# Patient Record
Sex: Male | Born: 1940 | Race: White | Hispanic: No | Marital: Married | Smoking: Former smoker
Health system: Southern US, Community
[De-identification: ages and names within clinical notes are randomized; demographics above are authoritative.]

## PROBLEM LIST (undated history)

## (undated) DIAGNOSIS — Z5189 Encounter for other specified aftercare: Secondary | ICD-10-CM

## (undated) DIAGNOSIS — R001 Bradycardia, unspecified: Secondary | ICD-10-CM

## (undated) DIAGNOSIS — C060 Malignant neoplasm of cheek mucosa: Secondary | ICD-10-CM

## (undated) DIAGNOSIS — Z85828 Personal history of other malignant neoplasm of skin: Secondary | ICD-10-CM

## (undated) DIAGNOSIS — K8020 Calculus of gallbladder without cholecystitis without obstruction: Secondary | ICD-10-CM

## (undated) DIAGNOSIS — J189 Pneumonia, unspecified organism: Secondary | ICD-10-CM

## (undated) DIAGNOSIS — K5010 Crohn's disease of large intestine without complications: Secondary | ICD-10-CM

## (undated) DIAGNOSIS — K219 Gastro-esophageal reflux disease without esophagitis: Secondary | ICD-10-CM

## (undated) DIAGNOSIS — I10 Essential (primary) hypertension: Secondary | ICD-10-CM

## (undated) DIAGNOSIS — K760 Fatty (change of) liver, not elsewhere classified: Secondary | ICD-10-CM

## (undated) DIAGNOSIS — M459 Ankylosing spondylitis of unspecified sites in spine: Secondary | ICD-10-CM

## (undated) DIAGNOSIS — K449 Diaphragmatic hernia without obstruction or gangrene: Secondary | ICD-10-CM

## (undated) DIAGNOSIS — K5100 Ulcerative (chronic) pancolitis without complications: Secondary | ICD-10-CM

## (undated) DIAGNOSIS — N200 Calculus of kidney: Secondary | ICD-10-CM

## (undated) DIAGNOSIS — Z923 Personal history of irradiation: Secondary | ICD-10-CM

## (undated) DIAGNOSIS — C801 Malignant (primary) neoplasm, unspecified: Secondary | ICD-10-CM

## (undated) DIAGNOSIS — Z9289 Personal history of other medical treatment: Secondary | ICD-10-CM

## (undated) DIAGNOSIS — I639 Cerebral infarction, unspecified: Secondary | ICD-10-CM

## (undated) DIAGNOSIS — I459 Conduction disorder, unspecified: Secondary | ICD-10-CM

## (undated) DIAGNOSIS — IMO0001 Reserved for inherently not codable concepts without codable children: Secondary | ICD-10-CM

## (undated) DIAGNOSIS — I2510 Atherosclerotic heart disease of native coronary artery without angina pectoris: Secondary | ICD-10-CM

## (undated) DIAGNOSIS — M542 Cervicalgia: Secondary | ICD-10-CM

## (undated) DIAGNOSIS — E785 Hyperlipidemia, unspecified: Secondary | ICD-10-CM

## (undated) HISTORY — DX: Diaphragmatic hernia without obstruction or gangrene: K44.9

## (undated) HISTORY — DX: Personal history of other medical treatment: Z92.89

## (undated) HISTORY — DX: Ulcerative (chronic) pancolitis without complications: K51.00

## (undated) HISTORY — DX: Cervicalgia: M54.2

## (undated) HISTORY — DX: Bradycardia, unspecified: R00.1

## (undated) HISTORY — PX: MOHS SURGERY: SUR867

## (undated) HISTORY — DX: Calculus of gallbladder without cholecystitis without obstruction: K80.20

## (undated) HISTORY — DX: Personal history of other malignant neoplasm of skin: Z85.828

## (undated) HISTORY — PX: UMBILICAL HERNIA REPAIR: SHX196

## (undated) HISTORY — PX: EXCISION ORAL TUMOR: SHX6265

## (undated) HISTORY — DX: Cerebral infarction, unspecified: I63.9

## (undated) HISTORY — DX: Hyperlipidemia, unspecified: E78.5

## (undated) HISTORY — DX: Ankylosing spondylitis of unspecified sites in spine: M45.9

## (undated) HISTORY — DX: Crohn's disease of large intestine without complications: K50.10

## (undated) HISTORY — DX: Fatty (change of) liver, not elsewhere classified: K76.0

## (undated) HISTORY — DX: Conduction disorder, unspecified: I45.9

## (undated) HISTORY — DX: Essential (primary) hypertension: I10

## (undated) HISTORY — PX: TRIGGER FINGER RELEASE: SHX641

---
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
## 1995-04-19 HISTORY — PX: CORONARY ARTERY BYPASS GRAFT: SHX141

## 1997-12-01 ENCOUNTER — Ambulatory Visit (HOSPITAL_COMMUNITY): Admission: RE | Admit: 1997-12-01 | Discharge: 1997-12-01 | Payer: Self-pay | Admitting: Gastroenterology

## 1997-12-01 DIAGNOSIS — K5010 Crohn's disease of large intestine without complications: Secondary | ICD-10-CM

## 1997-12-01 HISTORY — DX: Crohn's disease of large intestine without complications: K50.10

## 2001-05-21 ENCOUNTER — Encounter (INDEPENDENT_AMBULATORY_CARE_PROVIDER_SITE_OTHER): Payer: Self-pay | Admitting: *Deleted

## 2001-05-21 ENCOUNTER — Ambulatory Visit (HOSPITAL_COMMUNITY): Admission: RE | Admit: 2001-05-21 | Discharge: 2001-05-21 | Payer: Self-pay | Admitting: Gastroenterology

## 2001-05-21 DIAGNOSIS — K5100 Ulcerative (chronic) pancolitis without complications: Secondary | ICD-10-CM

## 2001-05-21 HISTORY — DX: Ulcerative (chronic) pancolitis without complications: K51.00

## 2004-03-21 ENCOUNTER — Ambulatory Visit: Payer: Self-pay | Admitting: Cardiology

## 2004-04-04 ENCOUNTER — Ambulatory Visit: Payer: Self-pay | Admitting: Cardiology

## 2004-04-04 ENCOUNTER — Ambulatory Visit: Payer: Self-pay | Admitting: Gastroenterology

## 2004-09-28 ENCOUNTER — Ambulatory Visit: Payer: Self-pay | Admitting: Cardiology

## 2004-12-06 ENCOUNTER — Ambulatory Visit: Payer: Self-pay | Admitting: Cardiology

## 2004-12-06 ENCOUNTER — Ambulatory Visit: Payer: Self-pay | Admitting: Gastroenterology

## 2005-03-20 ENCOUNTER — Ambulatory Visit: Payer: Self-pay | Admitting: Cardiology

## 2005-04-03 ENCOUNTER — Ambulatory Visit: Payer: Self-pay | Admitting: Gastroenterology

## 2005-04-04 ENCOUNTER — Ambulatory Visit: Payer: Self-pay

## 2005-09-26 ENCOUNTER — Ambulatory Visit: Payer: Self-pay

## 2005-10-11 ENCOUNTER — Ambulatory Visit: Payer: Self-pay | Admitting: Gastroenterology

## 2005-10-26 ENCOUNTER — Ambulatory Visit: Payer: Self-pay | Admitting: Gastroenterology

## 2005-11-28 ENCOUNTER — Ambulatory Visit: Payer: Self-pay | Admitting: Gastroenterology

## 2005-11-29 ENCOUNTER — Ambulatory Visit (HOSPITAL_COMMUNITY): Admission: RE | Admit: 2005-11-29 | Discharge: 2005-11-29 | Payer: Self-pay | Admitting: Gastroenterology

## 2005-11-30 ENCOUNTER — Ambulatory Visit: Payer: Self-pay | Admitting: Gastroenterology

## 2005-12-07 ENCOUNTER — Ambulatory Visit: Payer: Self-pay | Admitting: Cardiology

## 2005-12-07 LAB — CONVERTED CEMR LAB
AST: 27 units/L (ref 0–37)
Albumin: 3.9 g/dL (ref 3.5–5.2)
Chloride: 104 meq/L (ref 96–112)
Chol/HDL Ratio, serum: 2.1
Cholesterol: 147 mg/dL (ref 0–200)
Glomerular Filtration Rate, Af Am: 86 mL/min/{1.73_m2}
Glucose, Bld: 133 mg/dL — ABNORMAL HIGH (ref 70–99)
HDL: 71.7 mg/dL (ref >39.0)
Sodium: 139 meq/L (ref 135–145)
Total Bilirubin: 1.2 mg/dL (ref 0.3–1.2)
Total Protein: 6.7 g/dL (ref 6.0–8.3)
VLDL: 11 mg/dL (ref 0–40)

## 2005-12-12 ENCOUNTER — Ambulatory Visit: Payer: Self-pay | Admitting: Cardiology

## 2005-12-12 LAB — CONVERTED CEMR LAB
CO2: 27 meq/L (ref 19–32)
GFR calc non Af Amer: 71 mL/min
Glomerular Filtration Rate, Af Am: 86 mL/min/{1.73_m2}
Glucose, Bld: 98 mg/dL (ref 70–99)
Potassium: 4.3 meq/L (ref 3.5–5.1)

## 2006-03-27 ENCOUNTER — Ambulatory Visit: Payer: Self-pay | Admitting: Cardiology

## 2006-04-01 ENCOUNTER — Ambulatory Visit: Payer: Self-pay

## 2006-09-12 ENCOUNTER — Ambulatory Visit: Payer: Self-pay | Admitting: Gastroenterology

## 2006-09-12 LAB — CONVERTED CEMR LAB
ALT: 18 units/L (ref 0–53)
AST: 24 units/L (ref 0–37)
Alkaline Phosphatase: 39 units/L (ref 39–117)
BUN: 16 mg/dL (ref 6–23)
Basophils Absolute: 0 10*3/uL (ref 0.0–0.1)
Basophils Relative: 0.9 % (ref 0.0–1.0)
Bilirubin, Direct: 0.1 mg/dL (ref 0.0–0.3)
CO2: 28 meq/L (ref 19–32)
Chloride: 105 meq/L (ref 96–112)
Eosinophils Absolute: 0.1 10*3/uL (ref 0.0–0.6)
Eosinophils Relative: 2.8 % (ref 0.0–5.0)
Ferritin: 110.3 ng/mL (ref 22.0–322.0)
GFR calc Af Amer: 96 mL/min
HCT: 39.9 % (ref 39.0–52.0)
Iron: 71 ug/dL (ref 42–165)
Lymphocytes Relative: 20.7 % (ref 12.0–46.0)
MCHC: 34.7 g/dL (ref 30.0–36.0)
Monocytes Absolute: 0.3 10*3/uL (ref 0.2–0.7)
Monocytes Relative: 9.9 % (ref 3.0–11.0)
Neutro Abs: 2.2 10*3/uL (ref 1.4–7.7)
Potassium: 3.9 meq/L (ref 3.5–5.1)
RDW: 14.1 % (ref 11.5–14.6)
Saturation Ratios: 19.9 % — ABNORMAL LOW (ref 20.0–50.0)
Total Protein: 6.7 g/dL (ref 6.0–8.3)
Transferrin: 255.2 mg/dL (ref >=212.0)
WBC: 3.3 10*3/uL — ABNORMAL LOW (ref 4.5–10.5)

## 2006-09-19 ENCOUNTER — Ambulatory Visit: Payer: Self-pay | Admitting: Gastroenterology

## 2006-09-24 ENCOUNTER — Encounter: Payer: Self-pay | Admitting: Gastroenterology

## 2006-10-15 ENCOUNTER — Ambulatory Visit: Payer: Self-pay | Admitting: Cardiology

## 2006-10-16 ENCOUNTER — Ambulatory Visit: Payer: Self-pay | Admitting: Gastroenterology

## 2006-10-16 ENCOUNTER — Encounter: Payer: Self-pay | Admitting: Gastroenterology

## 2006-12-11 ENCOUNTER — Ambulatory Visit: Payer: Self-pay | Admitting: Gastroenterology

## 2006-12-11 LAB — CONVERTED CEMR LAB: Hgb A1c MFr Bld: 6.2 % — ABNORMAL HIGH (ref 4.6–6.0)

## 2006-12-26 ENCOUNTER — Emergency Department (HOSPITAL_COMMUNITY): Admission: EM | Admit: 2006-12-26 | Discharge: 2006-12-26 | Payer: Self-pay | Admitting: Emergency Medicine

## 2007-05-06 ENCOUNTER — Ambulatory Visit: Admission: RE | Admit: 2007-05-06 | Discharge: 2007-05-06 | Payer: Self-pay | Admitting: Gastroenterology

## 2007-05-06 ENCOUNTER — Ambulatory Visit: Payer: Self-pay | Admitting: Gastroenterology

## 2007-05-06 DIAGNOSIS — M542 Cervicalgia: Secondary | ICD-10-CM

## 2007-05-06 DIAGNOSIS — K5010 Crohn's disease of large intestine without complications: Secondary | ICD-10-CM | POA: Insufficient documentation

## 2007-05-06 DIAGNOSIS — R1310 Dysphagia, unspecified: Secondary | ICD-10-CM | POA: Insufficient documentation

## 2007-05-06 LAB — CONVERTED CEMR LAB
AST: 27 units/L (ref 0–37)
Basophils Absolute: 0 10*3/uL (ref 0.0–0.1)
Basophils Relative: 0.5 % (ref 0.0–1.0)
Bilirubin, Direct: 0.2 mg/dL (ref 0.0–0.3)
Eosinophils Absolute: 0.1 10*3/uL (ref 0.0–0.7)
Eosinophils Relative: 1.8 % (ref 0.0–5.0)
Lymphocytes Relative: 15.2 % (ref 12.0–46.0)
MCV: 102.7 fL — ABNORMAL HIGH (ref 78.0–100.0)
Monocytes Relative: 8.8 % (ref 3.0–12.0)
Platelets: 152 10*3/uL (ref 150–400)
Total Bilirubin: 1.1 mg/dL (ref 0.3–1.2)

## 2007-05-12 ENCOUNTER — Ambulatory Visit: Payer: Self-pay | Admitting: Cardiology

## 2007-05-12 LAB — CONVERTED CEMR LAB
LDL Cholesterol: 78 mg/dL (ref 0–99)
VLDL: 15 mg/dL (ref 0–40)

## 2007-05-27 ENCOUNTER — Ambulatory Visit: Payer: Self-pay

## 2007-11-11 ENCOUNTER — Ambulatory Visit: Payer: Self-pay | Admitting: Cardiology

## 2007-11-11 ENCOUNTER — Ambulatory Visit: Payer: Self-pay | Admitting: Gastroenterology

## 2007-11-11 LAB — CONVERTED CEMR LAB
BUN: 18 mg/dL (ref 6–23)
Chloride: 104 meq/L (ref 96–112)
GFR calc non Af Amer: 79 mL/min
Glucose, Bld: 119 mg/dL — ABNORMAL HIGH (ref 70–99)
Hgb A1c MFr Bld: 6.5 % — ABNORMAL HIGH (ref 4.6–6.0)
Potassium: 4.8 meq/L (ref 3.5–5.1)

## 2007-12-04 ENCOUNTER — Ambulatory Visit: Payer: Self-pay | Admitting: Gastroenterology

## 2007-12-04 DIAGNOSIS — M459 Ankylosing spondylitis of unspecified sites in spine: Secondary | ICD-10-CM | POA: Insufficient documentation

## 2007-12-04 DIAGNOSIS — I10 Essential (primary) hypertension: Secondary | ICD-10-CM

## 2007-12-04 DIAGNOSIS — I2510 Atherosclerotic heart disease of native coronary artery without angina pectoris: Secondary | ICD-10-CM | POA: Insufficient documentation

## 2007-12-04 DIAGNOSIS — E785 Hyperlipidemia, unspecified: Secondary | ICD-10-CM | POA: Insufficient documentation

## 2007-12-04 LAB — CONVERTED CEMR LAB
ALT: 18 units/L (ref 0–53)
AST: 24 units/L (ref 0–37)
Albumin: 3.9 g/dL (ref 3.5–5.2)
Alkaline Phosphatase: 37 units/L — ABNORMAL LOW (ref 39–117)
Basophils Relative: 0.8 % (ref 0.0–3.0)
Bilirubin, Direct: 0.1 mg/dL (ref 0.0–0.3)
Ferritin: 87.5 ng/mL (ref 22.0–322.0)
HCT: 41.8 % (ref 39.0–52.0)
Hemoglobin: 14.5 g/dL (ref 13.0–17.0)
Iron: 114 ug/dL (ref 42–165)
MCHC: 34.7 g/dL (ref 30.0–36.0)
MCV: 101.7 fL — ABNORMAL HIGH (ref 78.0–100.0)
Platelets: 143 10*3/uL — ABNORMAL LOW (ref 150–400)
RBC: 4.11 M/uL — ABNORMAL LOW (ref 4.22–5.81)
RDW: 12.9 % (ref 11.5–14.6)
Saturation Ratios: 29.5 % (ref 20.0–50.0)
Total Protein: 7.3 g/dL (ref 6.0–8.3)

## 2007-12-10 ENCOUNTER — Ambulatory Visit: Payer: Self-pay | Admitting: Gastroenterology

## 2007-12-16 ENCOUNTER — Ambulatory Visit: Payer: Self-pay | Admitting: Gastroenterology

## 2007-12-16 LAB — CONVERTED CEMR LAB: Fecal Occult Bld: NEGATIVE

## 2007-12-30 ENCOUNTER — Ambulatory Visit: Payer: Self-pay | Admitting: Cardiology

## 2007-12-30 LAB — CONVERTED CEMR LAB
BUN: 17 mg/dL (ref 6–23)
CO2: 31 meq/L (ref 19–32)
Calcium: 9 mg/dL (ref 8.4–10.5)
Chloride: 105 meq/L (ref 96–112)
Creatinine, Ser: 1.1 mg/dL (ref 0.4–1.5)
GFR calc non Af Amer: 71 mL/min
Glucose, Bld: 123 mg/dL — ABNORMAL HIGH (ref 70–99)

## 2008-04-27 ENCOUNTER — Ambulatory Visit: Payer: Self-pay | Admitting: Gastroenterology

## 2008-04-27 DIAGNOSIS — E118 Type 2 diabetes mellitus with unspecified complications: Secondary | ICD-10-CM

## 2008-04-27 LAB — CONVERTED CEMR LAB
Basophils Absolute: 0 10*3/uL (ref 0.0–0.1)
Basophils Relative: 0.1 % (ref 0.0–3.0)
Hemoglobin: 15.4 g/dL (ref 13.0–17.0)
Iron: 66 ug/dL (ref 42–165)
Lymphocytes Relative: 14.2 % (ref 12.0–46.0)
MCV: 103 fL — ABNORMAL HIGH (ref 78.0–100.0)
Monocytes Relative: 13.3 % — ABNORMAL HIGH (ref 3.0–12.0)
Neutro Abs: 3.6 10*3/uL (ref 1.4–7.7)
Neutrophils Relative %: 70.7 % (ref 43.0–77.0)
Platelets: 145 10*3/uL — ABNORMAL LOW (ref 150.0–400.0)
RBC: 4.39 M/uL (ref 4.22–5.81)
RDW: 13.4 % (ref 11.5–14.6)
WBC: 5.1 10*3/uL (ref 4.5–10.5)

## 2008-05-14 ENCOUNTER — Telehealth: Payer: Self-pay | Admitting: Gastroenterology

## 2008-05-19 ENCOUNTER — Telehealth: Payer: Self-pay | Admitting: Gastroenterology

## 2008-06-01 ENCOUNTER — Ambulatory Visit: Payer: Self-pay | Admitting: Cardiology

## 2008-06-07 ENCOUNTER — Ambulatory Visit: Payer: Self-pay | Admitting: Cardiology

## 2008-06-09 LAB — CONVERTED CEMR LAB
AST: 23 units/L (ref 0–37)
Alkaline Phosphatase: 43 units/L (ref 39–117)
BUN: 11 mg/dL (ref 6–23)
Bilirubin, Direct: 0.1 mg/dL (ref 0.0–0.3)
Calcium: 8.9 mg/dL (ref 8.4–10.5)
Cholesterol: 137 mg/dL (ref 0–200)
Creatinine, Ser: 0.9 mg/dL (ref 0.4–1.5)
GFR calc non Af Amer: 88.99 mL/min (ref >60)
Glucose, Bld: 112 mg/dL — ABNORMAL HIGH (ref 70–99)
HDL: 66.2 mg/dL (ref >39.00)
Hgb A1c MFr Bld: 6.4 % (ref 4.6–6.5)
Total CHOL/HDL Ratio: 2

## 2008-08-27 ENCOUNTER — Telehealth: Payer: Self-pay | Admitting: Gastroenterology

## 2008-09-22 ENCOUNTER — Encounter (INDEPENDENT_AMBULATORY_CARE_PROVIDER_SITE_OTHER): Payer: Self-pay | Admitting: *Deleted

## 2008-12-02 ENCOUNTER — Ambulatory Visit: Payer: Self-pay | Admitting: Gastroenterology

## 2008-12-02 DIAGNOSIS — R079 Chest pain, unspecified: Secondary | ICD-10-CM

## 2008-12-02 DIAGNOSIS — K219 Gastro-esophageal reflux disease without esophagitis: Secondary | ICD-10-CM

## 2008-12-02 DIAGNOSIS — R160 Hepatomegaly, not elsewhere classified: Secondary | ICD-10-CM

## 2008-12-03 ENCOUNTER — Ambulatory Visit (HOSPITAL_COMMUNITY): Admission: RE | Admit: 2008-12-03 | Discharge: 2008-12-03 | Payer: Self-pay | Admitting: Gastroenterology

## 2008-12-03 ENCOUNTER — Ambulatory Visit: Payer: Self-pay | Admitting: Gastroenterology

## 2008-12-03 DIAGNOSIS — K449 Diaphragmatic hernia without obstruction or gangrene: Secondary | ICD-10-CM

## 2008-12-03 HISTORY — DX: Diaphragmatic hernia without obstruction or gangrene: K44.9

## 2008-12-03 LAB — CONVERTED CEMR LAB
ALT: 23 units/L (ref 0–53)
AST: 32 units/L (ref 0–37)
Albumin: 4.2 g/dL (ref 3.5–5.2)
Alkaline Phosphatase: 48 units/L (ref 39–117)
Basophils Absolute: 0 10*3/uL (ref 0.0–0.1)
Basophils Relative: 0.4 % (ref 0.0–3.0)
Calcium: 9 mg/dL (ref 8.4–10.5)
Chloride: 100 meq/L (ref 96–112)
Creatinine, Ser: 1 mg/dL (ref 0.4–1.5)
Eosinophils Relative: 2.3 % (ref 0.0–5.0)
Ferritin: 99.6 ng/mL (ref 22.0–322.0)
Hgb A1c MFr Bld: 6.5 % (ref 4.6–6.5)
INR: 0.9 (ref 0.8–1.0)
Lymphs Abs: 1.1 10*3/uL (ref 0.7–4.0)
MCHC: 33.5 g/dL (ref 30.0–36.0)
MCV: 102.9 fL — ABNORMAL HIGH (ref 78.0–100.0)
Monocytes Relative: 12.6 % — ABNORMAL HIGH (ref 3.0–12.0)
Neutrophils Relative %: 62.9 % (ref 43.0–77.0)
Platelets: 156 10*3/uL (ref 150.0–400.0)
RDW: 13.4 % (ref 11.5–14.6)
TSH: 2.99 microintl units/mL (ref 0.35–5.50)
Transferrin: 297.6 mg/dL (ref 212.0–360.0)
WBC: 5.1 10*3/uL (ref 4.5–10.5)

## 2008-12-06 ENCOUNTER — Telehealth: Payer: Self-pay | Admitting: Gastroenterology

## 2008-12-07 ENCOUNTER — Ambulatory Visit: Payer: Self-pay | Admitting: Cardiology

## 2008-12-07 DIAGNOSIS — I2510 Atherosclerotic heart disease of native coronary artery without angina pectoris: Secondary | ICD-10-CM | POA: Insufficient documentation

## 2008-12-07 DIAGNOSIS — I252 Old myocardial infarction: Secondary | ICD-10-CM | POA: Insufficient documentation

## 2008-12-08 ENCOUNTER — Encounter: Payer: Self-pay | Admitting: Gastroenterology

## 2009-01-06 ENCOUNTER — Telehealth: Payer: Self-pay | Admitting: Gastroenterology

## 2009-02-07 ENCOUNTER — Telehealth: Payer: Self-pay | Admitting: Cardiology

## 2009-02-15 ENCOUNTER — Encounter: Payer: Self-pay | Admitting: Cardiology

## 2009-02-21 ENCOUNTER — Telehealth: Payer: Self-pay | Admitting: Cardiology

## 2009-02-22 ENCOUNTER — Telehealth: Payer: Self-pay | Admitting: Gastroenterology

## 2009-02-25 ENCOUNTER — Telehealth: Payer: Self-pay | Admitting: Gastroenterology

## 2009-03-23 ENCOUNTER — Encounter: Payer: Self-pay | Admitting: Gastroenterology

## 2009-03-23 ENCOUNTER — Encounter: Payer: Self-pay | Admitting: Cardiology

## 2009-06-07 ENCOUNTER — Ambulatory Visit: Payer: Self-pay | Admitting: Cardiology

## 2009-09-20 ENCOUNTER — Ambulatory Visit: Payer: Self-pay | Admitting: Cardiology

## 2009-09-20 DIAGNOSIS — I6529 Occlusion and stenosis of unspecified carotid artery: Secondary | ICD-10-CM | POA: Insufficient documentation

## 2009-09-27 ENCOUNTER — Telehealth (INDEPENDENT_AMBULATORY_CARE_PROVIDER_SITE_OTHER): Payer: Self-pay | Admitting: *Deleted

## 2009-09-28 ENCOUNTER — Encounter: Payer: Self-pay | Admitting: Cardiology

## 2009-09-28 ENCOUNTER — Ambulatory Visit: Payer: Self-pay

## 2009-09-28 ENCOUNTER — Ambulatory Visit: Payer: Self-pay | Admitting: Cardiology

## 2009-09-28 ENCOUNTER — Encounter (HOSPITAL_COMMUNITY): Admission: RE | Admit: 2009-09-28 | Discharge: 2009-10-07 | Payer: Self-pay | Admitting: Cardiology

## 2009-10-25 ENCOUNTER — Telehealth: Payer: Self-pay | Admitting: Cardiology

## 2010-01-01 HISTORY — PX: COLONOSCOPY: SHX174

## 2010-01-17 ENCOUNTER — Encounter (INDEPENDENT_AMBULATORY_CARE_PROVIDER_SITE_OTHER): Payer: Self-pay | Admitting: *Deleted

## 2010-01-17 ENCOUNTER — Telehealth: Payer: Self-pay | Admitting: Gastroenterology

## 2010-01-17 ENCOUNTER — Ambulatory Visit
Admission: RE | Admit: 2010-01-17 | Discharge: 2010-01-17 | Payer: Self-pay | Source: Home / Self Care | Attending: Gastroenterology | Admitting: Gastroenterology

## 2010-01-17 DIAGNOSIS — F329 Major depressive disorder, single episode, unspecified: Secondary | ICD-10-CM | POA: Insufficient documentation

## 2010-01-17 DIAGNOSIS — K5190 Ulcerative colitis, unspecified, without complications: Secondary | ICD-10-CM | POA: Insufficient documentation

## 2010-01-17 DIAGNOSIS — K8020 Calculus of gallbladder without cholecystitis without obstruction: Secondary | ICD-10-CM | POA: Insufficient documentation

## 2010-01-27 ENCOUNTER — Encounter: Payer: Self-pay | Admitting: Cardiology

## 2010-01-29 LAB — CONVERTED CEMR LAB
AST: 29 units/L (ref 0–37)
Albumin: 4.3 g/dL (ref 3.5–5.2)
Alkaline Phosphatase: 44 units/L (ref 39–117)
Basophils Relative: 0.5 % (ref 0.0–3.0)
Eosinophils Relative: 2.3 % (ref 0.0–5.0)
GFR calc non Af Amer: 87.61 mL/min (ref >60)
Glucose, Bld: 125 mg/dL — ABNORMAL HIGH (ref 70–99)
HCT: 42.2 % (ref 39.0–52.0)
HDL: 72.7 mg/dL (ref >39.00)
Hemoglobin: 14.6 g/dL (ref 13.0–17.0)
MCHC: 34.6 g/dL (ref 30.0–36.0)
MCV: 99.9 fL (ref 78.0–100.0)
Neutro Abs: 3.2 10*3/uL (ref 1.4–7.7)
Potassium: 5 meq/L (ref 3.5–5.1)
RBC: 4.23 M/uL (ref 4.22–5.81)
RDW: 14 % (ref 11.5–14.6)
Total Bilirubin: 0.7 mg/dL (ref 0.3–1.2)
Total CHOL/HDL Ratio: 2
Triglycerides: 74 mg/dL (ref 0.0–149.0)
VLDL: 14.8 mg/dL (ref 0.0–40.0)

## 2010-02-01 ENCOUNTER — Other Ambulatory Visit (AMBULATORY_SURGERY_CENTER): Payer: Medicare Other | Admitting: Gastroenterology

## 2010-02-01 ENCOUNTER — Other Ambulatory Visit: Payer: Self-pay | Admitting: Gastroenterology

## 2010-02-01 ENCOUNTER — Ambulatory Visit: Admit: 2010-02-01 | Payer: Self-pay | Admitting: Gastroenterology

## 2010-02-01 DIAGNOSIS — K5289 Other specified noninfective gastroenteritis and colitis: Secondary | ICD-10-CM

## 2010-02-01 DIAGNOSIS — Z1211 Encounter for screening for malignant neoplasm of colon: Secondary | ICD-10-CM

## 2010-02-02 ENCOUNTER — Encounter (INDEPENDENT_AMBULATORY_CARE_PROVIDER_SITE_OTHER): Payer: Self-pay | Admitting: *Deleted

## 2010-02-02 ENCOUNTER — Other Ambulatory Visit: Payer: Self-pay | Admitting: Gastroenterology

## 2010-02-02 ENCOUNTER — Other Ambulatory Visit: Payer: Medicare Other

## 2010-02-02 ENCOUNTER — Ambulatory Visit (INDEPENDENT_AMBULATORY_CARE_PROVIDER_SITE_OTHER): Payer: Medicare Other | Admitting: Gastroenterology

## 2010-02-02 ENCOUNTER — Encounter: Payer: Self-pay | Admitting: Gastroenterology

## 2010-02-02 DIAGNOSIS — K5190 Ulcerative colitis, unspecified, without complications: Secondary | ICD-10-CM

## 2010-02-02 DIAGNOSIS — F329 Major depressive disorder, single episode, unspecified: Secondary | ICD-10-CM

## 2010-02-02 DIAGNOSIS — F3289 Other specified depressive episodes: Secondary | ICD-10-CM

## 2010-02-02 LAB — BASIC METABOLIC PANEL
CO2: 29 mEq/L (ref 19–32)
Creatinine, Ser: 0.9 mg/dL (ref 0.4–1.5)

## 2010-02-02 LAB — IBC PANEL: Iron: 56 ug/dL (ref 42–165)

## 2010-02-02 LAB — HEPATIC FUNCTION PANEL
ALT: 21 U/L (ref 0–53)
AST: 26 U/L (ref 0–37)
Albumin: 4 g/dL (ref 3.5–5.2)
Total Protein: 6.7 g/dL (ref 6.0–8.3)

## 2010-02-02 LAB — B12 AND FOLATE PANEL: Vitamin B-12: >1500 pg/mL — ABNORMAL HIGH (ref 211–911)

## 2010-02-02 LAB — SEDIMENTATION RATE: Sed Rate: 14 mm/hr (ref 0–22)

## 2010-02-02 LAB — CBC WITH DIFFERENTIAL/PLATELET
HCT: 42.9 % (ref 39.0–52.0)
Hemoglobin: 14.5 g/dL (ref 13.0–17.0)
Lymphs Abs: 1 10*3/uL (ref 0.7–4.0)
MCHC: 33.9 g/dL (ref 30.0–36.0)
MCV: 100.4 fl — ABNORMAL HIGH (ref 78.0–100.0)
RBC: 4.27 Mil/uL (ref 4.22–5.81)

## 2010-02-02 LAB — FERRITIN: Ferritin: 98.4 ng/mL (ref 22.0–322.0)

## 2010-02-02 LAB — MAGNESIUM: Magnesium: 2.2 mg/dL (ref 1.5–2.5)

## 2010-02-02 LAB — GLUCOSE, CAPILLARY: Glucose-Capillary: 130 mg/dL — ABNORMAL HIGH (ref 70–99)

## 2010-02-02 LAB — TSH: TSH: 2.3 u[IU]/mL (ref 0.35–5.50)

## 2010-02-02 NOTE — Assessment & Plan Note (Signed)
Summary: f52m/dfg  Medications Added LOSARTAN POTASSIUM 100 MG TABS (LOSARTAN POTASSIUM) 1 once daily LIPITOR 80 MG TABS (ATORVASTATIN CALCIUM) 1 once daily      Allergies Added: NKDA  Visit Type:  3 mo f/u Primary Provider:  n/a  CC:  pt states he has had a little chest discomfort....sob w/stairs....denies any edema.  History of Present Illness: Andres Smith returns today for evaluation and management of coronary disease, history of myocardial infarction, normal left ventricular systolic function, mild mitral regurgitation, hyperlipidemia, type 2 diabetes, and hypertension.  He is having no symptoms of angina or ischemia. He denies any symptoms of TIAs or mini strokes.  He does have dyspnea on exertion .Marland Kitchen  He is very compliant with his medications. He exercises on a regular basis. His weight has been a challenge and he is up a little bit  His stress nuclear study was last done in 2009 and was stable.   His last blood work was June of this year. His fasting blood sugar was 125 creatinine 0.9 potassium 5.0 hemoglobin 14.6 normal LFTs, total cholesterol 159 triglycerides 74 HDL 72.7 LDL 72.    Current Medications (verified): 1)  Benicar 40 Mg Tabs (Olmesartan Medoxomil) .... Take 1 Tablet By Mouth Once A Day 2)  Carvedilol 6.25 Mg Tabs (Carvedilol) .... Take 1 Tablet By Mouth Twice A Day 3)  Foltx 2.5-25-2 Mg Tabs (Fa-Pyridoxine-Cyancobalamin) .... Take 1 Tablet By Mouth Once A Day 4)  Purinethol 50 Mg Tabs (Mercaptopurine) .... Take One By Mouth Once Daily 5)  Viagra 100 Mg Tabs (Sildenafil Citrate) .... Take 1 Tablet As Directed 6)  Vytorin 10-40 Mg Tabs (Ezetimibe-Simvastatin) .... One Tablet By Mouth Once Daily 7)  Aspirin 81 Mg  Tabs (Aspirin) .... One Tablet By Mouth Once Daily 8)  Metformin Hcl 500 Mg Tabs (Metformin Hcl) .... Once Daily 9)  Lialda 1.2 Gm Tbec (Mesalamine) .... Take 2 Tablets By Mouth Once Daily 10)  Omeprazole 20 Mg Cpdr (Omeprazole) .Marland Kitchen.. 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 Particia Lather umbilical hernia repair Trigger finger surgery  Family History: Last updated: 12/04/2007 Family History of Heart Disease: Father, Mother, Andres Smith, cousins, Aunt Family History of Colitis/Crohn's: Aunt, Cousin  Social History: Last updated: 12/04/2007 Married Patient has never smoked.  Alcohol Use - yes Daily Caffeine Use Illicit Drug Use - no  Risk Factors: Alcohol Use: 2 (12/04/2007)  Risk Factors: Smoking Status: never (12/04/2007)  Review of Systems       negative other than history of present illness  Vital Signs:  Patient profile:   70 year old male Height:      68 inches Weight:      203.4 pounds BMI:     31.04 Pulse rate:   49 / minute Pulse rhythm:   irregular BP sitting:   116 / 70  (left arm) Cuff size:   large  Vitals Entered By: Danielle Rankin, CMA (September 20, 2009 11:31 AM)  Physical Exam  General:  no acute distress, overweight Head:  normocephalic and atraumatic Eyes:  PERRLA/EOM intact; conjunctiva and lids normal. Neck:  Neck supple, no  JVD. No masses, thyromegaly or abnormal cervical nodes. Chest Wall:  no deformities or breast masses noted Lungs:  Clear bilaterally to auscultation and percussion. Heart:  PMI not displaced, normal S1-S2, no murmur. Right carotid bruit Msk:  decreased ROM.   Pulses:  pulses normal in all 4 extremities Extremities:  No  clubbing or cyanosis. Neurologic:  Alert and oriented x 3. Skin:  Intact without lesions or rashes. Psych:  Normal affect.   Problems:  Medical Problems Added: 1)  Dx of Carotid Artery Disease  (ICD-433.10) 2)  Dx of Carotid Artery Stenosis, Without Infarction  (ICD-433.10)  EKG  Procedure date:  09/20/2009  Findings:      sinus pericardia, no acute changes.  Impression & Recommendations:  Problem # 1:  CAD, NATIVE VESSEL (ICD-414.01) Will obtain stress nuclear study since the last one was in 2009. He may have silent ischemia with his diabetes. His updated medication list for this problem includes:    Carvedilol 6.25 Mg Tabs (Carvedilol) .Marland Kitchen... Take 1 tablet by mouth twice a day    Aspirin 81 Mg Tabs (Aspirin) ..... One tablet by mouth once daily  Orders: Nuclear Stress Test (Nuc Stress Test)  Problem # 2:  OLD MYOCARDIAL INFARCTION (ICD-412) Assessment: Unchanged  His updated medication list for this problem includes:    Carvedilol 6.25 Mg Tabs (Carvedilol) .Marland Kitchen... Take 1 tablet by mouth twice a day    Aspirin 81 Mg Tabs (Aspirin) ..... One tablet by mouth once daily  Problem # 3:  CORONARY ARTERY BYPASS GRAFT, FOUR VESSEL, HX OF (ICD-V45.81) Assessment: Unchanged  Problem # 4:  HYPERLIPIDEMIA, ATHEROGENIC (ICD-272.4) Assessment: Improved  His updated medication list for this problem includes:    Lipitor 80 Mg Tabs (Atorvastatin calcium) .Marland Kitchen... 1 once daily  Problem # 5:  DIABETES MELLITUS, TYPE II, MILD (ICD-250.00) Assessment: Unchanged  His updated medication list for this problem includes:    Losartan Potassium 100 Mg Tabs (Losartan potassium) .Marland Kitchen... 1 once daily    Aspirin 81 Mg Tabs (Aspirin) ..... One tablet by mouth once daily    Metformin Hcl 500 Mg Tabs (Metformin hcl) ..... Once daily  Problem # 6:  CAROTID ARTERY STENOSIS, WITHOUT INFARCTION (ICD-433.10) Assessment: New  Will obtain carotid Doppler His updated medication list for this problem  includes:    Aspirin 81 Mg Tabs (Aspirin) ..... One tablet by mouth once daily  His updated medication list for this problem includes:    Aspirin 81 Mg Tabs (Aspirin) ..... One tablet by mouth once daily  Other Orders: EKG w/ Interpretation (93000) Carotid Duplex (Carotid Duplex)  Patient Instructions: 1)  Your physician recommends that you schedule a follow-up appointment in: 6  MONTHS WITH DR WALL 2)  Your physician has recommended you make the following change in your medication: STOP BENICAR  3)  START LOSARTAN 100MG   4)  FISNISH VYTORIN 5)  START LIPITOR 80 MG 1 QD 6)  Your physician has requested that you have a carotid duplex. This test is an ultrasound of the carotid arteries in your neck. It looks at blood flow through these arteries that supply the brain with blood. Allow one hour for this exam. There are no restrictions or special instructions. 7)  Your physician has requested that you have an exercise stress myoview.  For further information please visit [email or web address]  Please follow instruction sheet, as given. Prescriptions: LIPITOR 80 MG TABS (ATORVASTATIN CALCIUM) 1 once daily  #30 x 11   Entered by:   Scherrie Bateman,  LPN   Authorized by:   Gaylord Shih, MD, [facility]   Signed by:   Scherrie Bateman, LPN on 78/46/9629   Method used:   Electronically to        [facility]* (retail)       [address]       [place], [place]  [address]       Ph: [phone]       Fax: [phone]   RxID:   [id number] LOSARTAN POTASSIUM 100 MG TABS (LOSARTAN POTASSIUM) 1 once daily  #30 x 11   Entered by:   Scherrie Bateman, LPN   Authorized by:   Gaylord Shih, MD, [facility]   Signed by:   Scherrie Bateman, LPN on 41/66/0630   Method used:   Electronically to        [facility]* (retail)       [address]       [place], [place]  [address]       Ph: [phone]       Fax: [phone]   RxID:   [id number]

## 2010-02-02 NOTE — Letter (Signed)
Summary: Addendum/[facility]  Addendum/[facility]   Imported By: Sherian Rein 04/29/2009 09:35:07  _____________________________________________________________________  External Attachment:    Type:   Image     Comment:   External Document

## 2010-02-02 NOTE — Assessment & Plan Note (Signed)
Summary: Cardiology Nuclear Testing  Nuclear Med Background Indications for Stress Test: Evaluation for Ischemia, Graft Patency   History: CABG, Heart Catheterization, Myocardial Infarction, Myocardial Perfusion Study  History Comments: '97MI> Cath>CABG 5/09 MPS normal NF scar with EF-45%  Symptoms: Chest Pain, Dizziness, DOE  Symptoms Comments: Last CP 2 weeks ago.   Nuclear Pre-Procedure Cardiac Risk Factors: Family History - CAD, History of Smoking, Hypertension, Lipids, NIDDM Caffeine/Decaff Intake: NONE NPO After: 9:00 PM Lungs: Clear IV 0.9% NS with Angio Cath: 22g     IV Site: R Hand IV Started by: Cathlyn Parsons, RN Chest Size (in) 46     Height (in): 68 Weight (lb): 202 BMI: 30.83 Tech Comments: CARVEDILOL HELD X 24HRS.  Nuclear Med Study 1 or 2 day study:  1 day     Stress Test Type:  Stress Reading MD:  Willa Rough, MD     Referring MD:  Valera Castle Resting Radionuclide:  Technetium 34m Tetrofosmin     Resting Radionuclide Dose:  11.0 mCi  Stress Radionuclide:  Technetium 31m Tetrofosmin     Stress Radionuclide Dose:  33.0 mCi   Stress Protocol Exercise Time (min):  9:00 min     Max HR:  130 bpm     Predicted Max HR:  150 bpm  Max Systolic BP: 195 mm Hg     Percent Max HR:  86.67 %     METS: 10.1 Rate Pressure Product:  81191    Stress Test Technologist:  Irean Hong,  RN     Nuclear Technologist:  Domenic Polite, CNMT  Rest Procedure  Myocardial perfusion imaging was performed at rest 45 minutes following the intravenous administration of Technetium 40m Tetrofosmin.  Stress Procedure  The patient exercised for nine minutes, RPE=15.   The patient stopped due to DOE and complained of chest tightness 2/10 at peak exercise.  There were significant ST-T wave changes, frequent PVC's, begiminy and trigeminy PVC's, rare PAC. The patient had a mild hypertensive response to exercise.  Technetium 104m Tetrofosmin was injected at peak exercise and myocardial  perfusion imaging was performed after a brief delay.  QPS Raw Data Images:  Normal; no motion artifact; normal heart/lung ratio. Stress Images:  Moderate decrease in activity in the infero-lateral wall Rest Images:  Same as stress Subtraction (SDS):  No evidence of ischemia. Transient Ischemic Dilatation:  1.05  (Normal <1.22)  Lung/Heart Ratio:  .28  (Normal <0.45)  Quantitative Gated Spect Images QGS EDV:  129 ml QGS ESV:  64 ml QGS EF:  50 % QGS cine images:  Decreased motion of the inferior wall.  Findings Abnormal      Overall Impression  Exercise Capacity: Good exercise capacity. BP Response: Normal blood pressure response. Clinical Symptoms: Chest tight (2/10) and SOB ECG Impression: No significant ST segment change suggestive of ischemia. Overall Impression Comments: There is old inferolateral scar with no ischemia.  Appended Document: Cardiology Nuclear Testing discussed with pt....no  change in meds. repeat in 2 years

## 2010-02-02 NOTE — Assessment & Plan Note (Addendum)
Summary: follow up/lk    History of Present Illness Visit Type: Follow-up Visit Primary GI MD: Sheryn Bison MD FACP FAGA Primary Provider: n/a Requesting Provider: n/a Chief Complaint: Patient here for routine f/u crohn's disease. He currently denies any GI problems. 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 His medical problems are managed by Dr. Valera Castle.He has mild glucose intolerance treated with metformin 500 mg a day, essential hypertension, and previous coronary artery bypass surgery. He also suffers from mild obesity.  His colitis has been managed with 6-MP which also has greatly controlled his ankylosing spondylitis symptomatology. Regular blood work has been unremarkable without evidence of leukopenia liver function test abnormalities. 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 His appetite is good and his weight is stable. He does exercise regularly.  There is no history of diarrhea, melena, hematochezia, or current acid reflux symptoms. He has  asymptomatic gallstones and has been evaluated by Dr. Ovidio Kin in surgery who has recommended clinical observation. The patient certainly denies any hepatobiliary complaints at this time.  Esther has new onset depression partially related to his retirement, alcohol use, and chronic medical problems. He has some sleep disturbance, but does admit to mood disturbance and mild depression without any psychotic symptomatology. He does not feel that he needs psychiatric evaluation.   GI Review of Systems      Denies abdominal pain, acid reflux, belching, bloating, chest pain, dysphagia with liquids, dysphagia with solids, heartburn, loss of appetite, nausea, vomiting, vomiting blood, weight loss, and  weight gain.      Reports liver problems.     Denies anal fissure,  black tarry stools, change in bowel habit, constipation, diarrhea, diverticulosis, fecal incontinence, heme positive stool, hemorrhoids, irritable bowel syndrome, jaundice, light color stool, rectal bleeding, and  rectal pain.    Current Medications (verified): 1)  Losartan Potassium 100 Mg Tabs (Losartan Potassium) .Marland Kitchen.. 1 Once Daily 2)  Carvedilol 6.25 Mg Tabs (Carvedilol) .... Take 1 Tablet By Mouth Twice A Day 3)  Foltx 2.5-25-2 Mg Tabs (Fa-Pyridoxine-Cyancobalamin) .... Take 1 Tablet By Mouth Once A Day 4)  Purinethol 50 Mg Tabs (Mercaptopurine) .... Take One By Mouth Once Daily 5)  Viagra 100 Mg Tabs (Sildenafil Citrate) .... Take 1 Tablet As Directed 6)  Lipitor 80 Mg Tabs (Atorvastatin Calcium) .... Take 1 Tablet By Mouth Once A Day 7)  Aspirin 81 Mg  Tabs (Aspirin) .... One Tablet By Mouth Once Daily 8)  Metformin Hcl 500 Mg (Osm) Xr24h-Tab (Metformin Hcl) .... Take 1 Tablet Daily 9)  Lialda 1.2 Gm Tbec (Mesalamine) .... Take 2 Tablets By Mouth Once Daily  Allergies (verified): No Known Drug Allergies  Past History:  Past medical, surgical, family and social histories (including risk factors) reviewed for relevance to current acute and chronic problems.  Past Medical History: Reviewed history from 05/28/2008 and no changes required. 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 Heart Bypass surgery x 7.Marland KitchenApril 18, 1997..Dr. Particia Lather umbilical hernia repair Trigger finger surgery  Family History: Reviewed history from 12/04/2007 and no changes required. Family History of  Heart  Disease: Father, Mother, Kateri Mc, cousins, Aunt Family History of Colitis/Crohn's: Aunt, Cousin  Social History: Reviewed history from 12/04/2007 and no changes required. Married Patient has never smoked.  Alcohol Use - yes Daily Caffeine Use Illicit Drug Use - no  Review of Systems       The patient complains of arthritis/joint pain, back pain, depression-new, muscle pains/cramps, night sweats, and sleeping problems.  The patient denies allergy/sinus, anemia, anxiety-new, blood in urine, breast changes/lumps, change in vision, confusion, cough, coughing up blood, fainting, fatigue, fever, headaches-new, hearing problems, heart murmur, heart rhythm changes, itching, menstrual pain, nosebleeds, pregnancy symptoms, shortness of breath, skin rash, sore throat, swelling of feet/legs, swollen lymph glands, thirst - excessive , urination - excessive , urination changes/pain, urine leakage, vision changes, and voice change.    Vital Signs:  Patient profile:   70 year old male Height:      68 inches Weight:      205.38 pounds BMI:     31.34 BSA:     2.07 Pulse rate:   56 / minute Pulse rhythm:   irregular BP sitting:   112 / 58  (left arm)  Vitals Entered By: Lamona Curl CMA Duncan Dull) (January 17, 2010 9:37 AM)  Physical Exam  General:  Well developed, well nourished, no acute distress. Head:  Normocephalic and atraumatic. Eyes:  PERRLA, no icterus.exam deferred to patient's ophthalmologist.   Lungs:  Clear throughout to auscultation.decreased BS on L and decreased BS on R.   Heart:  Regular rate and rhythm; no murmurs, rubs,  or bruits. Abdomen:  Somewhat protuberant abdomen without definite organomegaly, masses or tenderness. Patient does not have an umbilicus from previous surgery. Bowel sounds are normal. Rectal:  deferred until time of colonoscopy.   Msk:  Symmetrical with no gross deformities. Normal posture. Pulses:  Normal pulses noted. Extremities:  No clubbing,  cyanosis, edema or deformities noted. Neurologic:  Alert and  oriented x4;  grossly normal neurologically. Psych:  depressed affect.     Impression & Recommendations:  Problem # 1:  DEPRESSION (ICD-311) Assessment New Start Lexapro 10 mg at bedtime with followup at the time of colonoscopy. I again have counseled this patient about his alcohol intake and have urged him to be prudent in his alcohol use especially since his wife in the future has been greatly concerned about this problem. Review of his labs shows normal liver function test and CBC. He has a mild macrocytosis probably related to 6-MP usage.  Problem # 2:  GALLSTONES (ICD-574.20) Assessment: Unchanged no clinical symptomatology at this time with surgical intervention as needed per his clinical course.  Problem # 3:  GERD (ICD-530.81) Assessment: Improved He Wishes to discontinue PPI therapy, we will observe him and treat him as needed for this problem.  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 Periodic blood counts have all been normal without evidence of leukopenia or abnormal liver function tests. His colitis is associated with his ankylosing spondylitis. I have scheduled colonoscopy with dysplasia screening per  the chronicity of his disease with last exam 4 years ago.  Problem # 5:  CAROTID ARTERY STENOSIS, WITHOUT INFARCTION (ICD-433.10) Assessment: Improved continued cardiac followup with Dr. Valera Castle. I have asked him to continue all of his cardiac medications as listed and reviewed his record.  Problem # 6:  ESSENTIAL HYPERTENSION, BENIGN (ICD-401.1) Assessment: Improved Blood Pressure Today Normal at 112/58 and pulse is 56  and regular.  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  Problem # 9:  DIABETES MELLITUS, TYPE II, MILD (ICD-250.00) Assessment: Improved weight loss reduction suggested. The patient has refused referral to Dietary for consultation. He is to continue metformin 500 mg a day which she actually takes every other day. His hemoglobin A1c's have been in acceptable ranges. This is managed by Dr. Daleen Squibb and cardiology.  Other Orders: Colonoscopy (Colon)  Patient Instructions: 1)  Your procedure has been scheduled for 02/01/2010, please follow the seperate instructions.  2)  [facility] Patient Information Guide given to patient.  3)  Colonoscopy and Flexible Sigmoidoscopy brochure given.  4)  Your prescription(s) have been sent to you pharmacy.  5)  The medication list was reviewed and reconciled.  All changed / newly prescribed medications were explained.  A complete medication list was provided to the patient / caregiver. 6)  Copy sent to : Dr. Valera Castle in cardiology and Dr. Ovidio Kin at [facility]. Prescriptions: MOVIPREP 100 GM  SOLR (PEG-KCL-NACL-NASULF-NA ASC-C) As per prep instructions.  #1 x 0   Entered by:   Harlow Mares CMA (AAMA)   Authorized by:   Mardella Layman MD [facility]   Signed by:   Harlow Mares CMA (AAMA) on 01/17/2010   Method used:   Electronically to        [facility]* (retail)       [address]       [place], [place]  [address]       Ph: [phone]       Fax: [phone]   RxID:   [id number] LEXAPRO 10 MG TABS (ESCITALOPRAM OXALATE) take one by mouth at bedtime  #30 x 6   Entered by:   Harlow Mares CMA (AAMA)   Authorized by:   Mardella Layman MD [facility]   Signed by:   Harlow Mares CMA (AAMA) on 01/17/2010   Method used:   Electronically to        [facility]* (retail)       [address]       [place], [place]  [id number]       Ph: [phone]       Fax: [phone]   RxID:   [id number]   Appended Document: follow up/lk PROBLEM  #5 SHOULD READ CORONARY ARTERY DISEASE...DRP

## 2010-02-02 NOTE — Progress Notes (Signed)
Summary: speak to nurse  Medications Added OMEPRAZOLE 20 MG CPDR (OMEPRAZOLE) 1 by mouth qd       Phone Note Call from Patient Call back at Home Phone [phone]   Caller: Patient Call For: Juanda Chance Reason for Call: Talk to Nurse Summary of Call: Wife wants to speak directly to nurse Initial call taken by: Tawni Levy,  February 25, 2009 1:50 PM  Follow-up for Phone Call        Pt request records to be sent to [facility], Dr. Dwain Sarna.  Has appt next week.  ALso states pt spoke with Dr Jarold Motto about changing form nexium to omeprazole and Dr. Demetrius Charity stated this was OK.  Ashok Cordia RN  February 25, 2009 2:06 PM  Records faxed.  Rx sent.   Follow-up by: Ashok Cordia RN,  February 25, 2009 2:12 PM    New/Updated Medications: OMEPRAZOLE 20 MG CPDR (OMEPRAZOLE) 1 by mouth qd Prescriptions: OMEPRAZOLE 20 MG CPDR (OMEPRAZOLE) 1 by mouth qd  #30 x 11   Entered by:   Ashok Cordia RN   Authorized by:   Mardella Layman MD [facility]   Signed by:   Ashok Cordia RN on 02/25/2009   Method used:   Electronically to        [facility]* (retail)       [address]       [place], [place]  [id number]       Ph: [phone]       Fax: [phone]   RxID:   [id number]

## 2010-02-02 NOTE — Progress Notes (Signed)
Summary: nuc pre procedure  Phone Note Outgoing Call Call back at Home Phone [phone]   Call placed by: Cathlyn Parsons RN,  September 27, 2009 4:34 PM Call placed to: Patient Reason for Call: Confirm/change Appt Summary of Call: Left message with information on Myoview Information Sheet (see scanned document for details).      Nuclear Med Background Indications for Stress Test: Evaluation for Ischemia, Graft Patency   History: CABG, Myocardial Infarction, Myocardial Perfusion Study  History Comments: 5/09 MPS normal NF scar with EF-45%  Symptoms: DOE    Nuclear Pre-Procedure Cardiac Risk Factors: Family History - CAD, History of Smoking, Hypertension, Lipids, NIDDM Height (in): 68

## 2010-02-02 NOTE — Progress Notes (Signed)
Summary: Questions   Phone Note Call from Patient Call back at [phone]   Caller: Patient Call For: Dr. Jarold Motto Reason for Call: Talk to Nurse Summary of Call: Pts wife is calling because her husband came in today for a visit and she wants to know what is going on with him, says that he is forgetful and needs to know why he is on a different medication and what happened at his visit Initial call taken by: [place] Johnson,  January 17, 2010 2:56 PM  Follow-up for Phone Call        called Follow-up by: Mardella Layman MD Clementeen Graham,  January 17, 2010 3:31 PM

## 2010-02-02 NOTE — Letter (Signed)
Summary: Diabetic Instructions  [facility]  [address] [place], [place] [address]   Phone: [phone]  Fax: [phone]    Andres Smith 25-Jun-1940 MRN: [id number]   X   ORAL DIABETIC MEDICATION INSTRUCTIONS  The day before your procedure:   Take your diabetic pill as you do normally  The day of your procedure:   Do not take your diabetic pill    We will check your blood sugar levels during the admission process and again in Recovery before discharging you home

## 2010-02-02 NOTE — Letter (Signed)
Summary: Dr Lavonda Jumbo Office Note  Dr Lavonda Jumbo Office Note   Imported By: Roderic Ovens 05/02/2009 16:35:35  _____________________________________________________________________  External Attachment:    Type:   Image     Comment:   External Document

## 2010-02-02 NOTE — Progress Notes (Signed)
Summary: refill Mercaptopurine   Phone Note From Pharmacy   Summary of Call: Refill requested on Mercaptopurine 50 mg from [facility]. Initial call taken by: Ashok Cordia RN,  January 06, 2009 4:48 PM    Prescriptions: PURINETHOL 50 MG TABS (MERCAPTOPURINE) take one by mouth once daily  #30 Each x 5   Entered by:   Ashok Cordia RN   Authorized by:   Mardella Layman MD [facility]   Signed by:   Ashok Cordia RN on 01/06/2009   Method used:   Electronically to        [facility]* (retail)       [address]       [place], [place]  [id number]       Ph: [phone]       Fax: [phone]   RxID:   [id number]

## 2010-02-02 NOTE — Letter (Signed)
Summary: [facility]  [facility]   Imported By: Marylou Mccoy 07/28/2009 11:35:30  _____________________________________________________________________  External Attachment:    Type:   Image     Comment:   External Document

## 2010-02-02 NOTE — Assessment & Plan Note (Signed)
Summary: 6 month ./cy      Allergies Added: NKDA  Visit Type:  Follow-up Primary Provider:  n/a   History of Present Illness: Andres Smith returns today for further evaluation and management of his coronary disease, history of bypass surgery, hypertension, diabetes, mixed hyperlipidemia.  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 He is only exercising a couple times a week as opposed to 4 times a week. His weight is increased from the high 180s to now the high 190s.  Laboratory data back in December looked good including a hemoglobin A1c. He is due lipids.  He is having no increase in his warm up angina. He denies orthopnea, PND or edema. He's had no syncope or presyncope. He has chronic asymptomatic sinus bradycardia.  Clinical Reports Reviewed:  Nuclear Study:  05/27/2007:  Excerise capacity: Good exercise capacity  Blood Pressure response: Normal blood pressure response  Clinical symptoms: No chest pain  ECG impression: Significant ST abnormalities consistent with ischemia  Overall impression: Abnormal stress nuclear study as noted above    Jonelle Sidle, MD  04/01/2006:  Excerise capacity: Excellent exercise capacity  Blood Pressure response: Normal blood pressure response  Clinical symptoms: No chest pain or dyspnea  ECG impression: Insignificant upsloping ST segment depression  Overall impression: Low risk stress nuclear study. 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 EF calculated at 50% but visuall appears better.  Arvilla Meres, MD   Current Medications (verified): 1)  Benicar 40 Mg Tabs (Olmesartan Medoxomil) .... Take 1 Tablet By Mouth Once A Day 2)  Carvedilol 6.25 Mg Tabs (Carvedilol) .... Take 1 Tablet By Mouth Twice A Day 3)  Foltx 2.5-25-2 Mg Tabs (Fa-Pyridoxine-Cyancobalamin) .... Take 1 Tablet By Mouth Once A Day 4)  Purinethol 50 Mg Tabs (Mercaptopurine) .... Take One By Mouth Once Daily 5)  Viagra 100 Mg Tabs (Sildenafil  Citrate) .... Take 1 Tablet As Directed 6)  Vytorin 10-40 Mg Tabs (Ezetimibe-Simvastatin) .... One Tablet By Mouth Once Daily 7)  Aspirin 81 Mg  Tabs (Aspirin) .... One Tablet By Mouth Once Daily 8)  Metformin Hcl 500 Mg Tabs (Metformin Hcl) .... Once Daily 9)  Lialda 1.2 Gm Tbec (Mesalamine) .... Take 2 Tablets By Mouth Once Daily 10)  Omeprazole 20 Mg Cpdr (Omeprazole) .Marland Kitchen.. 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 Particia Lather umbilical hernia repair Trigger finger surgery  Family History: Last updated: 12/04/2007 Family History of Heart Disease: Father, Mother, Kateri Mc, cousins, Aunt Family History of Colitis/Crohn's: Aunt, Cousin  Social History: Last updated: 12/04/2007 Married Patient has never smoked.  Alcohol Use - yes Daily Caffeine Use Illicit Drug Use - no  Risk Factors: Alcohol Use: 2 (12/04/2007)  Risk Factors: Smoking Status: never (12/04/2007)  Review of Systems       negative other than history of present illness.  Vital Signs:  Patient profile:   70 year old male Height:      68 inches Weight:      202 pounds BMI:     30.83 Pulse rate:   48 / minute BP sitting:   148 /  70  (left arm) Cuff size:   regular  Vitals Entered By: Burnett Kanaris, CNA (June 07, 2009 10:56 AM)  Physical Exam  General:  Well developed, well nourished, in no acute distress. Head:  normocephalic and atraumatic Eyes:  PERRLA/EOM  intact; conjunctiva and lids normal. Neck:  Neck supple, no JVD. No masses, thyromegaly or abnormal cervical nodes. Lungs:  Clear bilaterally to auscultation and percussion. Heart:  Non-displaced PMI, chest non-tender; regular rate and rhythm, S1, S2 without murmurs, rubs or gallops. Carotid upstroke normal, no bruit. Normal abdominal aortic size, no bruits. Femorals normal pulses, no bruits. Pedals normal pulses. No edema, no varicosities. Abdomen:  Bowel sounds positive; abdomen soft and non-tender without masses, organomegaly, or hernias noted. No hepatosplenomegaly. Msk:  decreased ROM.   Pulses:  pulses normal in all 4 extremities Extremities:  No clubbing or cyanosis. Neurologic:  Alert and oriented x 3. Skin:  Intact without lesions or rashes. Psych:  Normal affect.   EKG  Procedure date:  06/07/2009  Findings:      sus bradycardia, first-degree A-V block, no change  Impression & Recommendations:  Problem # 1:  CAD, NATIVE VESSEL (ICD-414.01) Assessment Unchanged  His updated medication list for this problem includes:    Carvedilol 6.25 Mg Tabs (Carvedilol) .Marland Kitchen... Take 1 tablet by mouth twice a day    Aspirin 81 Mg Tabs (Aspirin) ..... 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 Take 1 tablet by mouth twice a day    Aspirin 81 Mg Tabs (Aspirin) ..... 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 Take 1 tablet by mouth twice a day    Aspirin 81 Mg Tabs (Aspirin) ..... One tablet by mouth once daily  Problem # 5:  HYPERLIPIDEMIA, ATHEROGENIC (ICD-272.4) Will check lipids today. His updated medication list for this problem includes:    Vytorin 10-40 Mg Tabs (Ezetimibe-simvastatin) ..... One tablet by mouth once daily  Problem # 6:  ESSENTIAL HYPERTENSION, BENIGN (ICD-401.1) Assessment: Deteriorated I suspect his weight gain and decreased exercises contributed to this. He will begin to exercise 4 times a week and dry lose 5-8 pounds. He'll monitor his blood pressure time. His goal blood pressure is 130/80. His updated medication list for this problem includes:    Benicar 40 Mg Tabs (Olmesartan medoxomil) .Marland Kitchen... Take 1 tablet by mouth once a day    Carvedilol 6.25 Mg Tabs (Carvedilol) .Marland Kitchen... Take 1 tablet by mouth twice a day    Aspirin 81 Mg Tabs (Aspirin) ..... 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 Take 1 tablet by mouth once a day    Aspirin 81 Mg Tabs (Aspirin) ..... One tablet by mouth once daily    Metformin Hcl 500 Mg Tabs (Metformin hcl) ..... Once daily  Patient Instructions: 1)  Your physician  recommends that you schedule a follow-up appointment in: 3 months with Dr. Daleen Squibb 2)  Your physician recommends that you continue on your current medications as directed. Please refer to the Current Medication list given to you today. 3)  Your physician recommends that you have a lipid profile, liver, bmet, and cbc today. 4)  Your physician has requested that you regularly monitor and record your blood pressure readings at home.  Please use the same machine at the same time of day to check your readings and record them to bring to your follow-up visit. goal of 130/80  b/p reading.

## 2010-02-02 NOTE — Progress Notes (Signed)
Summary: referral from rheumatology   Phone Note Call from Patient Call back at Home Phone [phone] Call back at cell phone [phone]   Caller: Spouse- betty Reason for Call: Talk to Nurse, Referral Details for Reason: Per pt wife calling, referral for  rheumatology. 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  Initial call taken by: Lorne Skeens,  February 07, 2009 2:32 PM  Follow-up for Phone Call        S/W Kathie Rhodes, she wants a rheumatologist for Mr. Orbach. I gave her Dr. Fatima Sanger name and number. She was very thankful. Follow-up by: Duncan Dull, RN, BSN,  February 07, 2009 3:06 PM

## 2010-02-02 NOTE — Letter (Signed)
Summary: [facility] Instructions  [facility]  [address] [place], [place] [address]   Phone: [phone]  Fax: [phone]       YATES WEISGERBER    71-10-1940    MRN: [id number]        Procedure Day Dorna Bloom: Wednesday 02/01/2010     Arrival Time: 10:30am     Procedure Time: 11:30am     Location of Procedure:                    X  [facility] (4th Floor)   PREPARATION FOR COLONOSCOPY WITH MOVIPREP   Starting 5 days prior to your procedure 01/27/2010 do not eat nuts, seeds, popcorn, corn, beans, peas,  salads, or any raw vegetables.  Do not take any fiber supplements (e.g. Metamucil, Citrucel, and Benefiber).  THE DAY BEFORE YOUR PROCEDURE         Tuesday 01/31/2010  1.  Drink clear liquids the entire day-NO SOLID FOOD  2.  Do not drink anything colored red or purple.  Avoid juices with pulp.  No orange juice.  3.  Drink at least 64 oz. (8 glasses) of fluid/clear liquids during the day to prevent dehydration and help the prep work efficiently.  CLEAR LIQUIDS INCLUDE: Water Jello Ice Popsicles Tea (sugar ok, no milk/cream) Powdered fruit flavored drinks Coffee (sugar ok, no milk/cream) Gatorade Juice: apple, white grape, white cranberry  Lemonade Clear bullion, consomm, broth Carbonated beverages (any kind) Strained chicken noodle soup Hard Candy                             4.  In the morning, mix first dose of MoviPrep solution:    Empty 1 Pouch A and 1 Pouch B into the disposable container    Add lukewarm drinking water to the top line of the container. Mix to dissolve    Refrigerate (mixed solution should be used within 24 hrs)  5.  Begin drinking the prep at 5:00 p.m. The MoviPrep container is divided by 4 marks.   Every 15 minutes drink the solution down to the next mark (approximately 8 oz) until the full liter is complete.   6.  Follow completed prep with 16 oz of clear liquid of your choice (Nothing red or purple).  Continue to  drink clear liquids until bedtime.  7.  Before going to bed, mix second dose of MoviPrep solution:    Empty 1 Pouch A and 1 Pouch B into the disposable container    Add lukewarm drinking water to the top line of the container. Mix to dissolve    Refrigerate  THE DAY OF YOUR PROCEDURE      Wednesday 02/01/2010  Beginning at 6:30am (5 hours before procedure):         1. Every 15 minutes, drink the solution down to the next mark (approx 8 oz) until the full liter is complete.  2. Follow completed prep with 16 oz. of clear liquid of your choice.    3. You may drink clear liquids until 9:30am (2 HOURS BEFORE PROCEDURE).   MEDICATION INSTRUCTIONS  Unless otherwise instructed, you should take regular prescription medications with a small sip of water   as early as possible the morning of your procedure.  Follow seperate instructions on your diabetic medication.        OTHER INSTRUCTIONS  You will need a responsible adult at least 71 years of  age to accompany you and drive you home.   This person must remain in the waiting room during your procedure.  Wear loose fitting clothing that is easily removed.  Leave jewelry and other valuables at home.  However, you may wish to bring a book to read or  an iPod/MP3 player to listen to music as you wait for your procedure to start.  Remove all body piercing jewelry and leave at home.  Total time from sign-in until discharge is approximately 2-3 hours.  You should go home directly after your procedure and rest.  You can resume normal activities the  day after your procedure.  The day of your procedure you should not:   Drive   Make legal decisions   Operate machinery   Drink alcohol   Return to work  You will receive specific instructions about eating, activities and medications before you leave.    The above instructions have been reviewed and explained to me by   _______________________    I fully understand and can  verbalize these instructions _____________________________ Date _________

## 2010-02-02 NOTE — Progress Notes (Signed)
Summary: surgeon   Phone Note Call from Patient Call back at Home Phone [phone]   Caller: wife, Kathie Rhodes Call For: Dr. Jarold Motto Reason for Call: Talk to Nurse Summary of Call: would like the name of a surgeon that pt can go to for his gallbladder Initial call taken by: Vallarie Mare,  February 22, 2009 1:24 PM  Follow-up for Phone Call        Dr. Daleen Squibb has suggested Dr. Ezzard Standing. Follow-up by: Ashok Cordia RN,  February 22, 2009 1:36 PM  Additional Follow-up for Phone Call Additional follow up Details #1::        fine if he will do it... Additional Follow-up by: Mardella Layman MD FACG,  February 22, 2009 4:33 PM    Additional Follow-up for Phone Call Additional follow up Details #2::    Wife notified.    Follow-up by: Ashok Cordia RN,  February 22, 2009 4:43 PM

## 2010-02-02 NOTE — Progress Notes (Signed)
Summary: questions re med   Phone Note Call from Patient   Caller: Spouse betty [phone] Reason for Call: Talk to Nurse Summary of Call: pt taking lipitor and insurance will not pay for it until january, or until it comes out in generic, they will cover vytorin if he can be changed to that, would like to go ahead and change now because the copay is so high even with the insurance, just filled the lipitor 10-20, can rx be called to [facility] for them to hold until he needs it next month? Initial call taken by: Glynda Jaeger,  October 25, 2009 10:02 AM  Follow-up for Phone Call        refill Vytorin 10-40 as taking before. Follow-up by: Gaylord Shih, MD, [facility],  October 26, 2009 11:20 AM     Appended Document: questions re med    Clinical Lists Changes  Medications: Changed medication from LIPITOR 80 MG TABS (ATORVASTATIN CALCIUM) 1 once daily to VYTORIN 10-40 MG TABS (EZETIMIBE-SIMVASTATIN) Take one tablet by mouth dailyat bedtime - Signed Rx of VYTORIN 10-40 MG TABS (EZETIMIBE-SIMVASTATIN) Take one tablet by mouth dailyat bedtime;  #30 x 11;  Signed;  Entered by: Lisabeth Devoid RN;  Authorized by: Gaylord Shih, MD, [facility];  Method used: Electronically to [facility] [address] [place], [place]  [id number], Ph: [phone], Fax: [phone]    Prescriptions: VYTORIN 10-40 MG TABS (EZETIMIBE-SIMVASTATIN) Take one tablet by mouth dailyat bedtime  #30 x 11   Entered by:   Lisabeth Devoid RN   Authorized by:   Gaylord Shih, MD, [facility]   Signed by:   Lisabeth Devoid RN on 10/26/2009   Method used:   Electronically to        [facility]* (retail)       [address]       [place], [place]  [id number]       Ph: [phone]       Fax: [phone]   RxID:   [id number]

## 2010-02-02 NOTE — Progress Notes (Signed)
Summary: referral general surgeon    Phone Note Call from Patient Call back at Home Phone [phone]   Caller: Spouse Reason for Call: Talk to Nurse, Referral Details for Reason: Per pt wife calling, referral general surgeon, - gallstone.  Initial call taken by: Lorne Skeens,  February 21, 2009 9:43 AM  Follow-up for Phone Call        Ovidio Kin MD at [facility]. Follow-up by: Gaylord Shih, MD, [facility],  February 21, 2009 10:49 AM     Appended Document: referral general surgeon  LMTCB./CY  Appended Document: referral general surgeon  PT'S WIFE AWARE./CY

## 2010-02-06 LAB — GLUCOSE, CAPILLARY
Glucose-Capillary: 127 mg/dL — ABNORMAL HIGH (ref 70–99)
Glucose-Capillary: 127 mg/dL — ABNORMAL HIGH (ref 70–99)

## 2010-02-07 ENCOUNTER — Encounter: Payer: Self-pay | Admitting: Gastroenterology

## 2010-02-08 NOTE — Assessment & Plan Note (Addendum)
Summary: follow up colon and labs.per DRP/lk   Vital Signs:  Patient profile:   70 year old male Height:      68 inches Weight:      205 pounds BMI:     31.28 BSA:     2.07 Pulse rate:   64 / minute Pulse rhythm:   regular BP sitting:   134 / 58  (left arm)  Vitals Entered By: Andres Smith Smith CMA Andres Smith Smith) (February 02, 2010 1:09 PM)  History of Present Illness Visit Type: Follow-up Visit Primary GI MD: Andres Bison MD FACP FAGA Primary Provider: n/a Requesting Provider: n/a Chief Complaint: Patient here for f/u after colonoscopy as well as for discussion of labs. History of Present Illness:   Andres Smith is colonoscopy yesterday which showed some mild scattered colonic erosions. I spoke with his wife and his son were concerned about his change in mood with associated depression and anxiety. He is now on Lexapro 10 mg a day for the last 2 weeks with improvement in his sleep pattern and depression. 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 They suggested that speak with him further concerning possible psychiatric difficulties.  Andres Smith Smith feels that he has" lost control" and does have symptoms of depression but denies any psychotic symptoms. He does not admit that he has a drinking problem, but apparently for the first 35 years of his marriage he did not use alcohol, therefore this is a more recent habit he is formed. He has not had any DWI,social consequences, or history of withdrawal problems. He estimates that he uses 2-4 drinks a day.     GI Review of Systems      Denies abdominal pain, belching, bloating, chest pain, dysphagia with liquids, dysphagia with solids, heartburn, loss of appetite, nausea, vomiting, vomiting blood, weight loss, and  weight gain.        Denies anal fissure, black tarry stools, change in bowel habit, constipation, diarrhea, diverticulosis, fecal incontinence, heme positive stool, hemorrhoids, irritable  bowel syndrome, jaundice, light color stool, liver problems, rectal bleeding, and  rectal pain.  Current Medications (verified): 1)  Losartan Potassium 100 Mg Tabs (Losartan Potassium) .Marland Kitchen.. 1 Once Daily 2)  Carvedilol 6.25 Mg Tabs (Carvedilol) .... Take 1 Tablet By Mouth Twice A Day 3)  Foltx 2.5-25-2 Mg Tabs (Fa-Pyridoxine-Cyancobalamin) .... Take 1 Tablet By Mouth Once A Day 4)  Purinethol 50 Mg Tabs (Mercaptopurine) .... Take One By Mouth Once Daily 5)  Viagra 100 Mg Tabs (Sildenafil Citrate) .... Take 1 Tablet As Directed 6)  Lipitor 80 Mg Tabs (Atorvastatin Calcium) .... Take 1 Tablet By Mouth Once A Day 7)  Aspirin 81 Mg  Tabs (Aspirin) .... One Tablet By Mouth Once Daily 8)  Metformin Hcl 500 Mg (Osm) Xr24h-Tab (Metformin Hcl) .... Take 1 Tablet Daily 9)  Lialda 1.2 Gm Tbec (Mesalamine) .... Take 2 Tablets By Mouth Once Daily 10)  Lexapro 10 Mg Tabs (Escitalopram Oxalate) .... Take One By Mouth At Bedtime  Allergies (verified): No Known Drug Allergies  Past History:  Past Medical History: Reviewed history from 05/28/2008 and no changes required. 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 Heart Bypass surgery x 7.Marland KitchenApril  18, 1997..Dr. Particia Smith umbilical hernia repair Trigger finger surgery  Family History: Reviewed history from 12/04/2007 and no changes required. Family History of Heart Disease: Father, Mother, Andres Smith Smith, cousins, Aunt Family History of Colitis/Crohn's: Aunt, Cousin  Social History: Reviewed history from 12/04/2007 and no changes  required. Married Patient has never smoked.  Alcohol Use - yes Daily Caffeine Use Illicit Drug Use - no   Impression & Recommendations:  Problem # 1:  DEPRESSION (ICD-311) Assessment Improved Continue Lexapro 10 mg a day. I have called Dr. Dawayne Smith office and made him aware that Andres Smith Smith is to call them within the next week to set up an appointment for evaluation and treatment. He may need further outpatient alcohol counseling also.  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 Recent labs are pending for review.  Patient Instructions: 1)  Copy sent to : Dr. Caralyn Smith and Dr. Valera Smith 2)  Please continue current medications.

## 2010-02-08 NOTE — Procedures (Addendum)
Summary: Colonoscopy  Patient: Andres Smith Note: All result statuses are Final unless otherwise noted.  Tests: (1) Colonoscopy (COL)   COL Colonoscopy           DONE     [facility]     [address]     [place], [place]  [address]           COLONOSCOPY PROCEDURE REPORT           PATIENT:  Andres Smith, Andres Smith  MR#:  [id number]     BIRTHDATE:  05-20-1940, 69 yrs. old  GENDER:  male     ENDOSCOPIST:  Vania Rea. Jarold Motto, MD, [facility]     REF. BY:     PROCEDURE DATE:  02/01/2010     PROCEDURE:  Colonoscopy with biopsy     ASA CLASS:  Class II     INDICATIONS:  CHRONIC IBD SCREEN.     MEDICATIONS:   Fentanyl 75 mcg IV, Versed 8 mg IV           DESCRIPTION OF PROCEDURE:   After the risks benefits and     alternatives of the procedure were thoroughly explained, informed     consent was obtained.  Digital rectal exam was performed and     revealed no abnormalities.   The LB 180AL [id number] endoscope was     introduced through the anus and advanced to the cecum, which was     identified by both the appendix and ileocecal valve, without     limitations.  The quality of the prep was excellent, using     MoviPrep.  The instrument was then slowly withdrawn as the colon     was fully examined.     <<PROCEDUREIMAGES>>           FINDINGS:  erosions found scattered throught the colon. SEE     PICTURES.BIOPSIES JAR #1.SCATTERED APTHOUS EROSIONS IN     CECUM,RECTUM AND COLON.SCATTERED AREAS OF EDEMA AND GRanularity     also noted.  other finding. ATROPHIC MUCOSA BIOPSIED RANDOMLY IN     JAR #2. FOR DYSPLASIA SCREENING. DONE EVERY 10 CM.  No polyps or     cancers were seen.   Retroflexed views in the rectum revealed not     done.  INFLAMMED RECTUM.  The scope was then withdrawn from the     patient and the procedure completed.           COMPLICATIONS:  None     ENDOSCOPIC IMPRESSION:     1) Erosions found scattered throught the colon     2) Other finding     3) No polyps or cancers     1.MILD  COLITIS ASSOCIATED WITH ANKYLOSING SPONDYLITIS.ON RX.           2.R/O DYSPLASIA.     RECOMMENDATIONS:     1) Await biopsy results     2) Continue current medications     3) Repeat Colonoscopy in 5 years.     REPEAT EXAM:  No           ______________________________     Vania Rea. Jarold Motto, MD, [facility]           CC:  Gaylord Shih, MD           n.     Rosalie Doctor:   Vania Rea. Patterson at 02/01/2010 12:01 PM           Ardath Sax, [id number]  Note: An  exclamation mark (!) indicates a result that was not dispersed into the flowsheet. Document Creation Date: 02/01/2010 12:01 PM _______________________________________________________________________  (1) Order result status: Final Collection or observation date-time: 02/01/2010 11:50 Requested date-time:  Receipt date-time:  Reported date-time:  Referring Physician:   Ordering Physician: Sheryn Bison [phone]) Specimen Source:  Source: Launa Grill Order Number: [id number] Lab site:   Appended Document: Colonoscopy     Procedures Next Due Date:    Colonoscopy: 02/2015

## 2010-02-16 NOTE — Letter (Signed)
Summary: Patient Notice- Colon Biospy Results  [facility]  [address] [place], [place] [address]   Phone: [phone]  Fax: [phone]        February 07, 2010 MRN: [id number]    Andres Smith [address] [place], [place]  [address]    Dear Andres Smith,  I am pleased to inform you that the biopsies taken during your recent colonoscopy did not show any evidence of cancer upon pathologic examination.  Additional information/recommendations:  __No further action is needed at this time.  Please follow-up with      your primary care physician for your other healthcare needs.  __Please call [phone] to schedule a return visit to review      your condition.  xx__Continue with the treatment plan as outlined on the day of your      exam.  _x_You should have a repeat colonoscopy examination for this problem           in 5_ years.  Please call us if you are having persistent problems or have questions about your condition that have not been fully answered at this time.  Sincerely,  Mardella Layman MD [facility]   This letter has been electronically signed by your physician.  Appended Document: Patient Notice- Colon Biospy Results Letter Mailed

## 2010-02-22 NOTE — Letter (Signed)
Summary: [facility]  [facility]   Imported By: Marylou Mccoy 02/15/2010 14:47:37  _____________________________________________________________________  External Attachment:    Type:   Image     Comment:   External Document

## 2010-02-23 ENCOUNTER — Ambulatory Visit (INDEPENDENT_AMBULATORY_CARE_PROVIDER_SITE_OTHER): Payer: Medicare Other | Admitting: Psychology

## 2010-02-23 DIAGNOSIS — F331 Major depressive disorder, recurrent, moderate: Secondary | ICD-10-CM

## 2010-05-16 ENCOUNTER — Encounter: Payer: Self-pay | Admitting: Cardiology

## 2010-05-16 NOTE — Assessment & Plan Note (Signed)
[facility]                            CARDIOLOGY OFFICE NOTE   FIELDING, MAULT                         MRN:          [id number]  DATE:05/12/2007                            DOB:          Aug 03, 1940    Mr. Trieu returns today for further management of the following issues:   1. Coronary artery disease.  He has had an inferolateral wall infarct,      status post coronary bypass surgery in 1997.  His last stress      Myoview showed no significant ischemia.  He is having some dyspnea      on exertion and has had some chest tightness.  Most of this is non-      exertion.  He still works out on a regular basis.  2. History of type 2 diabetes.  His last hemoglobin A1c was 6.2%.  His      fasting blood sugar was 160 in May.  His weight is stable.  3. Hyperlipidemia.  He is due lipids and they have not been checked.  4. Hypertension.  This has been under great control.  5. Asymptomatic sinus bradycardia.  He has had this for a number of      years and is still asymptomatic.  He is on low-dose Carvedilol at      6.25 b.i.d..   MEDICATIONS:  Unchanged since his last visit.  Please refer to the  maintenance medication list.   PHYSICAL EXAMINATION:  GENERAL:  He is in no acute distress.  VITAL SIGNS:  His blood pressure is 142/84.  His pulse is 52 and  regular.  His weight is 197, stable.  NECK:  Carotids upstrokes are  equal bilaterally without bruits.  No JVD.  Thyroid is not enlarged.  Trachea is midline.  LUNGS:  Clear.  HEART:  Reveals a regular rate and rhythm.  No gallop.  ABDOMEN:  Soft, good bowel sounds.  There is no tenderness.  EXTREMITIES:  No sinus, clubbing or edema.  Pulses are intact.  NEURO:  Intact.   ASSESSMENT/PLAN:  Derrick is doing well.  With his history of coronary  disease, dyspnea on exertion and some chest discomfort, we will obtain  an exercise rest stress Myoview off of Carvedilol.  We will check a  lipid panel today.  We have  renewed his medications.  I will see him  back in 6 months.     Thomas C. Daleen Squibb, MD, [facility]  Electronically Signed    TCW/MedQ  DD: 05/12/2007  DT: 05/12/2007  Job #: [id number]

## 2010-05-16 NOTE — Assessment & Plan Note (Signed)
[facility]                            CARDIOLOGY OFFICE NOTE   KENTAVIUS, DETTORE                         MRN:          [id number]  DATE:10/15/2006                            DOB:          October 30, 1940    Andres Smith comes in today for further management of the following issues.  1. Coronary artery disease.  He is status post inferolateral wall      infarct and status post coronary artery bypass grafting in 1997.      Last Myoview was April 08, 2006:  EF 49% with mild peri-infarct      ischemia.  He is having no symptoms of angina except on extreme      exertion.  He still works out on a regular basis.  2. History of type 2 diabetes.  Dr. Eloise Harman checked a hemoglobin A1c      in September at 6.4%.  His other blood work looked unremarkable      except for a fasting blood sugar of 160.  He has gained about 6-8      pounds over the past year.  3. Hyperlipidemia.  This is under excellent control with Vytorin.  His      LFTs were recently checked and were normal.  He will be due lipids      in December 2008.  4. Hypertension.  This has been well controlled.  He has a history of      elevated potassium at 9.7 on Benicar 40.  He is now on 20 and      watches potassium-rich foods.  Most recent potassium was checked in      September 2008 and was 3.9.  His creatinine was 1.0.   MEDICATIONS:  1. Foltx 1 a day.  2. Aspirin 81 mg a day.  3. Coreg 6.25 b.i.d.  4. Captopurine 50 mg a day.  5. Asacol 1200 mg 3 times a day.  6. Vytorin 10/40 daily.  7. Benicar 20 mg daily.   PHYSICAL EXAMINATION:  VITAL SIGNS:  Blood pressure 144/72, pulse 48 and  regular.  He is in sinus bradycardia with first-degree AV block.  This  is stable.  His weight is up 5 pounds to 195.  HEENT:  Unchanged.  NECK:  Carotid upstrokes are equal bilaterally without bruits.  No JVD.  Thyroid is not enlarged.  Trachea is midline.  There are no bruits.  LUNGS:  Clear to auscultation.  HEART:   Regular rate and rhythm without gallop, rub, or murmur.  ABDOMEN:  Soft, good bowel sounds.  EXTREMITIES:  Reveal no cyanosis, clubbing, or edema.  Pulses are  intact.   ASSESSMENT AND PLAN:  [name] is doing well except for his weight gain.  This has caused an increase in his hemoglobin A1c, his fasting blood  sugar, and also his blood pressure.   PLAN:  1. Increase cardio activity.  2. Try to lose 5-8 pounds over the next 6-8 weeks.  If he does not do      so, we will place him on  Actos 15 mg a day.  3. I will see him back again in April 2009.  He will be due lipids in      December.     Thomas C. Daleen Squibb, MD, [facility]  Electronically Signed    TCW/MedQ  DD: 10/15/2006  DT: 10/16/2006  Job #: [id number]

## 2010-05-16 NOTE — Assessment & Plan Note (Signed)
[facility]                            CARDIOLOGY OFFICE NOTE   MADSEN, RIDDLE                         MRN:          [id number]  DATE:11/11/2007                            DOB:          [birth year]    Kahne comes in today for followup.  He is having no symptoms of angina or  ischemia.  He is working out almost 4-5 days a week.  Unfortunately, his  weight has increased and he is aware of that.  He is watching his diet  as best he can.   He had a stable stress Myoview, May 27, 2007.  His exercise tolerance  was excellent, EF 45%, inferior akinesia, no ischemia.   He also had blood work which showed a total cholesterol 165,  triglycerides 76, HDL 72, LDL 78 on Vytorin.   CURRENT MEDICATIONS:  1. Aspirin 81 mg a day.  2. Asacol 1200 mg p.o. b.i.d.  3. Vytorin 10/40 daily.  4. Benicar 40 mg a day.  5. Carvedilol 6.25 b.i.d.  6. Mercaptopurine 50 mg a day.  7. Foltx 1 a day.   PHYSICAL EXAMINATION:  VITAL SIGNS:  His blood pressure was high at  156/70.  I rechecked it was 151/70, his pulse is 50 and regular, his  weight is 205 which is up 8 pounds.  HEENT:  Slightly ruddy complexion.  Sclerae are slightly injected.  Rest  of his HEENT is negative.  NECK:  He has ankylosing spondylitis.  He has a very stiff neck with  limited flexion or extension.  His carotid upstrokes were equal  bilaterally without bruits.  No JVD.  Thyroid is not enlarged.  Trachea  is midline.  LUNGS:  Clear to auscultation and percussion.  HEART:  Soft S1 and S2.  No murmur, rub, or gallop.  ABDOMEN:  Soft, good bowel sounds.  No midline bruit.  No pulsatile  mass.  There is no hepatomegaly.  EXTREMITIES:  No cyanosis, clubbing, or edema.  Pulses are present  2+/4+.  No sign of DVT.  He has some varicose veins.  SKIN:  Thin.  He has got some ecchymoses.   ASSESSMENT AND PLAN:  Anothy's weight is going up.  I am concerned he may  have an elevated hemoglobin A1c once again.   He may need to go back on  oral hypoglycemics.  I would  probably go with some metformin to decrease his appetite as well.  We  might also consider Actos.  If this is necessary, I will talk to Dr.  Sheryn Bison to clear him with this.     Thomas C. Daleen Squibb, MD, [facility]  Electronically Signed    TCW/MedQ  DD: 11/11/2007  DT: 11/12/2007  Job #: [id number]

## 2010-05-19 ENCOUNTER — Ambulatory Visit (INDEPENDENT_AMBULATORY_CARE_PROVIDER_SITE_OTHER): Payer: Medicare Other | Admitting: Cardiology

## 2010-05-19 ENCOUNTER — Encounter: Payer: Self-pay | Admitting: Cardiology

## 2010-05-19 VITALS — BP 132/78 | HR 49 | Resp 18 | Ht 68.0 in | Wt 204.8 lb

## 2010-05-19 DIAGNOSIS — E119 Type 2 diabetes mellitus without complications: Secondary | ICD-10-CM

## 2010-05-19 DIAGNOSIS — R5383 Other fatigue: Secondary | ICD-10-CM

## 2010-05-19 DIAGNOSIS — E785 Hyperlipidemia, unspecified: Secondary | ICD-10-CM

## 2010-05-19 DIAGNOSIS — I6529 Occlusion and stenosis of unspecified carotid artery: Secondary | ICD-10-CM

## 2010-05-19 DIAGNOSIS — I2510 Atherosclerotic heart disease of native coronary artery without angina pectoris: Secondary | ICD-10-CM

## 2010-05-19 DIAGNOSIS — Z951 Presence of aortocoronary bypass graft: Secondary | ICD-10-CM

## 2010-05-19 DIAGNOSIS — I10 Essential (primary) hypertension: Secondary | ICD-10-CM

## 2010-05-19 DIAGNOSIS — R160 Hepatomegaly, not elsewhere classified: Secondary | ICD-10-CM

## 2010-05-19 DIAGNOSIS — R350 Frequency of micturition: Secondary | ICD-10-CM

## 2010-05-19 DIAGNOSIS — R5381 Other malaise: Secondary | ICD-10-CM

## 2010-05-19 DIAGNOSIS — I252 Old myocardial infarction: Secondary | ICD-10-CM

## 2010-05-19 LAB — BASIC METABOLIC PANEL
BUN: 14 mg/dL (ref 6–23)
Calcium: 9.3 mg/dL (ref 8.4–10.5)
Creatinine, Ser: 0.9 mg/dL (ref 0.4–1.5)

## 2010-05-19 LAB — LIPID PANEL
HDL: 80.4 mg/dL (ref >39.00)
LDL Cholesterol: 60 mg/dL (ref 0–99)
Total CHOL/HDL Ratio: 2

## 2010-05-19 LAB — CBC WITH DIFFERENTIAL/PLATELET
Basophils Relative: 0.4 % (ref 0.0–3.0)
Eosinophils Absolute: 0.1 10*3/uL (ref 0.0–0.7)
Eosinophils Relative: 1.1 % (ref 0.0–5.0)
Lymphocytes Relative: 19.6 % (ref 12.0–46.0)
Neutrophils Relative %: 67 % (ref 43.0–77.0)
Platelets: 151 10*3/uL (ref 150.0–400.0)
RBC: 4.14 Mil/uL — ABNORMAL LOW (ref 4.22–5.81)
WBC: 6.1 10*3/uL (ref 4.5–10.5)

## 2010-05-19 LAB — HEPATIC FUNCTION PANEL
AST: 25 U/L (ref 0–37)
Alkaline Phosphatase: 43 U/L (ref 39–117)
Total Bilirubin: 1.3 mg/dL — ABNORMAL HIGH (ref 0.3–1.2)

## 2010-05-19 LAB — PSA: PSA: 2.08 ng/mL (ref 0.10–4.00)

## 2010-05-19 NOTE — Assessment & Plan Note (Signed)
Good control, no change in meds.

## 2010-05-19 NOTE — Assessment & Plan Note (Signed)
Check lipids today 

## 2010-05-19 NOTE — Patient Instructions (Addendum)
Your physician recommends that you return for lab work in: today.  We will call you with your lab results. Your physician recommends that you schedule a follow-up appointment in: 6 months with Dr. Daleen Squibb Your physician has requested that you have a carotid duplex. This test is an ultrasound of the carotid arteries in your neck. It looks at blood flow through these arteries that supply the brain with blood. Allow one hour for this exam. There are no restrictions or special instructions.  IN November SAME DAY APPT WITH DR. Daleen Squibb

## 2010-05-19 NOTE — Assessment & Plan Note (Signed)
[facility]                            CARDIOLOGY OFFICE NOTE   YOUSAF, SAINATO                         MRN:          [id number]  DATE:03/27/2006                            DOB:          [birth year]    Dakhari comes in today for further management of the following issues:  1. Coronary artery disease.  He is status post bypass surgery in 1997.      Please see note September 26, 2005 for details.  His last Myoview      was April 7 with EF of 49% with mild peri-infarct ischemia, old      inferior lateral wall infarct.  2. History of type 2 diabetes, this has resolved with weight loss and      exercise.  His last hemoglobin A1C December 2007 was 6.3%.  3. Hyperlipidemia.  This is under excellent control with Vytorin.  His      last HDL was actually 71.7, total 147, LDL 65, triglycerides 54.  4. Hypertension.  His potassium was mildly elevated at 5.7, on Benicar      40.  We cut him to 20 and asked him to hydrate and watch potassium      rich foods.  A repeat showed a potassium of 4.3 December 12, 2005.      He has normal renal function with a creatinine of 1.1.   He has been doing well. He is having no angina.  He exercises on a  regular basis.   Medications are listed and are unchanged except his Benicar is at 20 mg  a day.   His blood pressure is 129/62.  Pulse 47, sinus brady.   EKG confirms sinus brady with 1st degree AV block of 220 milliseconds  which is unchanged.  HEENT:  Normocephalic.  Atraumatic.  PERRLA, extraocular is intact.  Sclerae is clear.  He has got a little scleral injection on the left.  Facial asymmetry is normal.  Dentition is satisfactory.  NECK:  Is supple.  Carotid upstrokes are equal throughout without  bruits.  No JVD.  Thyroid is not enlarged.  Trachea is midline.  LUNGS:  Are clear.  HEART:  Reveals a regular rate and rhythm without murmur, rub or gallop.  ABDOMEN:  Exam is soft, good bowel sounds.  There is no midline  bruit.  There is no hepatomegaly.  EXTREMITIES:  No cyanosis, clubbing or edema.  Pulses are intact.  NEURO:  Exam is intact.   ASSESSMENT/PLAN:  Geary is doing remarkably well.  We will set him up for  an exercise rest-stress Myoview off of beta blocker.  He will continue  his current meds.  I will plan on seeing him back in 6 months.     Thomas C. Daleen Squibb, MD, [facility]  Electronically Signed    TCW/MedQ  DD: 03/27/2006  DT: 03/27/2006  Job #: [id number]

## 2010-05-19 NOTE — Progress Notes (Signed)
   Patient ID: Andres Smith, male    DOB: 02-22-1940, 70 y.o.   MRN: [id number]  HPI  [name] returns for E and M of his CAD, HTN, DM2, MHL. He is having no angina. Weight is up about 10 lbs. Compliant with meds. Is due blood work.  EKG today shows SB without acute change.  Review of Systems  All other systems reviewed and are negative.      Physical Exam  Nursing note and vitals reviewed. Constitutional: He is oriented to person, place, and time. He appears well-developed and well-nourished. No distress.       overweight  HENT:  Head: Normocephalic and atraumatic.  Eyes: EOM are normal. Pupils are equal, round, and reactive to light.  Neck: Neck supple. No JVD present. No tracheal deviation present. No thyromegaly present.  Cardiovascular: Regular rhythm and intact distal pulses.   No extrasystoles are present. Bradycardia present.  PMI is not displaced.  Exam reveals no gallop and no distant heart sounds.   No murmur heard. Pulses:      Carotid pulses are on the right side with bruit. Pulmonary/Chest: Effort normal and breath sounds normal.  Abdominal: Soft. Bowel sounds are normal. He exhibits distension. There is no tenderness.  Musculoskeletal: He exhibits no edema.       Baseline decreased ROM  Neurological: He is alert and oriented to person, place, and time.  Skin: Skin is warm and dry.  Psychiatric: He has a normal mood and affect.

## 2010-05-19 NOTE — Assessment & Plan Note (Signed)
[facility]                              CARDIOLOGY OFFICE NOTE   Andres, Smith                         MRN:          [id number]  DATE:09/26/2005                            DOB:          1940-02-16    Andres Smith comes into today for a six-month checkup.   He has gained a little bit of weight this summer but is back into his  exercise routine. He is having no angina or ischemia.   PROBLEM LIST:  1. Coronary artery disease. He is status post coronary artery bypass      grafting in 1997 with left internal mammary graft to the left anterior      descending artery and a diagonal, vein graft to an obtuse marginal 2      and intermittent and a vein graft to a posterior descending vessel. The      last stress test was April 2007 which showed good exercise tolerance,      EF of 49%; there was mild peri-infarct ischemia. He has an old      inferolateral wall infarct.  2. History of type 2 diabetes. He has resolved his elevated blood sugars      with weight loss and exercise. His last hemoglobin in December 2006 was      normal.  3. Hyperlipidemia. Under good control with Vytorin 10/40. He will be due      lipids in December.  4. Hypertension. Under excellent control.   MEDICATIONS:  1. Foltx 1 a day.  2. Aspirin 81 mg a day.  3. Coreg 6.25 b.i.d.  4. Mercaptopurine 50 mg a day.  5. Asacol 1200 mg t.i.d.  6. Vytorin 10/40 daily.  7. Benicar 40 mg a day.   PHYSICAL EXAMINATION:  VITAL SIGNS:  His blood pressure is 146/66. His heart  is 46 in sinus brady which is stable.  NECK:  His carotid upstrokes were equal bilaterally without bruits. There is  no JVD. Thyroid was not enlarged. Trachea is midline.  LUNGS:  Clear.  HEART:  Reveals soft S1 and S2.  ABDOMEN:  Soft with good bowel sounds. There is no hepatomegaly.  EXTREMITIES:  No clubbing, cyanosis, or edema. Pulses are intact.   Electrocardiogram is essentially normal except for small Qs  inferiorly.   ASSESSMENT AND PLAN:  I think Andres Smith is doing well. I renewed his  prescriptions today and gave him generic carvedilol. We put him in the  computer for blood work in December which includes a comprehensive metabolic  panel, CBC, fasting lipid panel, TSH and a PSA.   I will plan on seeing him back in six months.       Andres C. Daleen Squibb, MD, [facility]     TCW/MedQ  DD:  09/26/2005  DT:  09/28/2005  Job #:  [id number]   cc:   Andres Rea. Jarold Motto, MD, Andres Smith, [place]

## 2010-05-19 NOTE — Assessment & Plan Note (Signed)
Stable, continue current meds 

## 2010-05-19 NOTE — Assessment & Plan Note (Signed)
[facility]                         GASTROENTEROLOGY OFFICE NOTE   Andres Smith                         MRN:          [id number]  DATE:11/30/2005                            DOB:          [birth year]    Andres Smith returns today.  He has been having some pain in his right hip.  This seems to be worse when walking or running but is not effected by  using an elliptical machine.  He denies any known trauma to his hip.  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  His hip spaces were well  preserved and there was no evidence of aseptic necrosis of his hips.   Andres Smith remains asymptomatic in terms of his colitis.  We switched him from  Asacol to Lialda for once a Smith dosing.  He also is on chronic 6-MP  therapy and his blood counts have all been fine.   I did not perform a general physical exam but examination of the right  hip area was unremarkable.   RECOMMENDATIONS:  1. Trial of Celebrex 200 mg one to two times a Smith as-needed.  2. Prescription again for Lialda 2.4 grams once a Smith in place of      Asacol.  3. Continue 6-MP at current dose as his other medications which are      managed by Dr. Daleen Squibb.  4. The patient again advised about control drinking which he seems to      be working on rather diligently.  5. Schedule followup as previously outlined.     Andres Rea. Andres Motto, MD, [facility], [facility]  Electronically Signed    Andres Smith  DD: 11/30/2005  DT: 11/30/2005  Job #: [id number]   cc:   Andres C. Wall, MD, [facility]

## 2010-05-19 NOTE — Assessment & Plan Note (Signed)
[facility]                           GASTROENTEROLOGY OFFICE NOTE   Andres Smith, Andres Smith                         MRN:          [id number]  DATE:10/26/2005                            DOB:          09-Feb-1940    Andres Smith comes back for followup of his lab data performed October 11, 2005.  He  also has been switched from Asacol to Lialda 2.6 grams a day in place of his  previous Asacol.  He is currently asymptomatic.   Blood work showed a white count of 3,700, hemoglobin 14.9, platelet count of  178,000 with a normal liver profile.  Carbohydrate deficient transferrin  (CDT) was 2.2 with normal less than 2.6.   As per my previous notes, Andres Smith is taking:  1. Coreg 6.25 mg twice a day.  2. And, 6-MP 50 mg alternating with 25 mg every other day.  3. Benicar 40 mg a day.  4. Vytorin 10/40 a day.  5. Lialda daily.   VITAL SIGNS:  Weight today was 187.4 and blood pressure 124/58.  Pulse was  58 and regular.   GENERAL PHYSICAL EXAMINATION:  Not performed.   I had a long talk with Andres Smith and his wife about his medical problems and my  concern about his excessive ethanol intake.  This flag was raised when I  spoke with his wife who also is a patient of mine.  The patient had been  drinking daily rather significant amounts of wine and he today was very  receptive to my counseling and suggestion that we attempt to control  drinking protocol where he does not drink during week days and that he limit  his weekend use of wine to two glasses per night.  He seems very receptive  to this approach and he and his wife will check back with me in four to six  weeks.  He scores 3 out of 4 on CAGE scale for alcohol screening which  certainly puts him at risk for alcohol dependency.  However, he has not had  impaired control, withdrawal symptoms, increased tolerance, or relieve  drinking.  Therefore, we have gone ahead as mentioned above and we will try  to do moderation of  drinking, set some drinking goals with close followup  and we will see how he does with this protocol.  If it looks like he cannot  get better control of his drinking problems, we will probably need to refer  him to a rehab 30-day program.  Hopefully, this will not be needed.     Andres Rea. Jarold Motto, MD, [facility], [facility]    DRP/MedQ  DD: 10/26/2005  DT: 10/27/2005  Job #: [id number]   cc:   Thomas C. Wall, MD, [facility]

## 2010-05-19 NOTE — Assessment & Plan Note (Signed)
Stable clinically. Repeat in the fall.

## 2010-05-19 NOTE — Assessment & Plan Note (Signed)
[facility]                           GASTROENTEROLOGY OFFICE NOTE   ROEN, MACGOWAN                         MRN:          [id number]  DATE:10/11/2005                            DOB:          24-Feb-1940    Andres Smith has no complaints whatsoever and is doing extremely well from a  gastrointestinal standpoint.  He is having regular bowel movements without  melena or hematochezia.  On close questioning, he is not taking his Asacol  but at least twice a day.  He is on 6-MP at alternating doses of 50 mg and  25 mg every other day.   He sees Dr. Juanito Doom for his primary care and has been taken off of his  diabetic medications because of a very extreme conditioning program he has  undergone with associated weight loss.  On questioning today, it is obvious  that Yani does use ethanol rather heavily on a daily basis, and there has  been some concern expressed by his wife concerning this behavior.  We had a  long talk today concerning alcohol abuse and the need for possible  intervention in his case.   Weight today is 189 pounds and blood pressure 108/52.  Pulse was 48 and  regular.  I could not appreciate stigmata of chronic liver disease.  His  chest was clear.  He appeared to be in a regular rhythm without significant  murmurs, gallops, or rubs at this time.  There is no hepatosplenomegaly,  abdominal masses, or tenderness.  Bowel sounds were normal.  There was no  edema or phlebitis.  Mental status was clear.   ASSESSMENT:  1. Crohn's colitis in remission on 6-MP therapy.  On reviewing his chart,      he has had positive serologies most consistent with Crohn's disease,      although he appears to have a mucosal type of disease.  2. Coronary artery disease with a previous bypass graft in 1997.  3. Well-controlled type 2 diabetes.  4. 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  I doubt that he has ethanol      dependency but does seem to have abuse.   RECOMMENDATIONS:  1. We will switch him from Asacol to Lialda 2.5 g once a day.  2. Continue 6-MP at current doses.  3. Check a CBC, liver profile, and CDT (carbohydrate deficient transferrin      level) to see where he is in terms of ethanol consumption.  4. The patient will need a followup colonoscopy within the next few      months.  5. I asked him to return in 2 weeks' time for review of his blood work and      to bring his wife with him for that office visit.       Vania Rea. Jarold Motto, MD, [facility], [place]      DRP/MedQ  DD:  10/12/2005  DT:  10/13/2005  Job #:  [id number]   cc:   Thomas C. Wall, MD, [facility]

## 2010-05-24 ENCOUNTER — Telehealth: Payer: Self-pay | Admitting: *Deleted

## 2010-05-24 DIAGNOSIS — I2510 Atherosclerotic heart disease of native coronary artery without angina pectoris: Secondary | ICD-10-CM

## 2010-05-24 NOTE — Telephone Encounter (Signed)
Your physician recommends that you return for lab work in: Tuesday 05/30/10 bmet  Pt aware of test results.

## 2010-05-30 ENCOUNTER — Other Ambulatory Visit (INDEPENDENT_AMBULATORY_CARE_PROVIDER_SITE_OTHER): Payer: Medicare Other | Admitting: *Deleted

## 2010-05-30 DIAGNOSIS — I2510 Atherosclerotic heart disease of native coronary artery without angina pectoris: Secondary | ICD-10-CM

## 2010-05-30 LAB — BASIC METABOLIC PANEL
BUN: 18 mg/dL (ref 6–23)
Chloride: 108 mEq/L (ref 96–112)
GFR: 74.89 mL/min (ref >60.00)
Glucose, Bld: 143 mg/dL — ABNORMAL HIGH (ref 70–99)
Potassium: 4.7 mEq/L (ref 3.5–5.1)
Sodium: 142 mEq/L (ref 135–145)

## 2010-06-20 ENCOUNTER — Other Ambulatory Visit: Payer: Self-pay | Admitting: *Deleted

## 2010-06-20 MED ORDER — FA-PYRIDOXINE-CYANOCOBALAMIN 2.5-25-2 MG PO TABS: 1.0000 | ORAL_TABLET | Freq: Every day | ORAL | Status: DC

## 2010-07-17 ENCOUNTER — Other Ambulatory Visit: Payer: Self-pay

## 2010-07-17 MED ORDER — NITROGLYCERIN 0.4 MG SL SUBL: 0.4000 mg | SUBLINGUAL_TABLET | SUBLINGUAL | Status: DC | PRN

## 2010-07-26 ENCOUNTER — Other Ambulatory Visit: Payer: Self-pay | Admitting: Gastroenterology

## 2010-08-15 ENCOUNTER — Telehealth: Payer: Self-pay | Admitting: Gastroenterology

## 2010-08-17 ENCOUNTER — Telehealth: Payer: Self-pay | Admitting: Cardiology

## 2010-08-17 NOTE — Telephone Encounter (Signed)
Pt wife calls today b/c pt had been having episodes of chest pain that went away without the use of nitroglycerin. Pt has been exercising 3 times a week on the elliptical and denies chest pain. Appt made for 09/05/10 at 9:45 am with Dr. Lonia Chimera RN

## 2010-08-17 NOTE — Telephone Encounter (Signed)
Patient wife calling. Patient C/O angina.

## 2010-08-25 ENCOUNTER — Telehealth: Payer: Self-pay | Admitting: Gastroenterology

## 2010-08-25 NOTE — Telephone Encounter (Signed)
The patient's wife wants to talk to you about some current concerns about her husband.  She wants to "tattle tale" before his appt on 09/06/10.  Please call her on her cell phone number [phone].  These are medical concerns not related to his colitis.  They will be going to the beach next week.  She is "despirate to talk to you before his appt".

## 2010-09-05 ENCOUNTER — Encounter: Payer: Self-pay | Admitting: Cardiology

## 2010-09-05 ENCOUNTER — Ambulatory Visit (INDEPENDENT_AMBULATORY_CARE_PROVIDER_SITE_OTHER): Payer: Medicare Other | Admitting: Cardiology

## 2010-09-05 VITALS — BP 132/66 | HR 51 | Ht 68.0 in | Wt 208.1 lb

## 2010-09-05 DIAGNOSIS — I6529 Occlusion and stenosis of unspecified carotid artery: Secondary | ICD-10-CM

## 2010-09-05 DIAGNOSIS — R079 Chest pain, unspecified: Secondary | ICD-10-CM

## 2010-09-05 DIAGNOSIS — I2510 Atherosclerotic heart disease of native coronary artery without angina pectoris: Secondary | ICD-10-CM

## 2010-09-05 NOTE — Assessment & Plan Note (Signed)
Carotid Dopplers in the fall.

## 2010-09-05 NOTE — Assessment & Plan Note (Signed)
Stable. No change in treatment. Symptoms of angina reviewed and have respond.

## 2010-09-05 NOTE — Assessment & Plan Note (Signed)
Noncardiac.reassurance given.

## 2010-09-05 NOTE — Progress Notes (Signed)
HPI Andres Smith returns today for evaluation and management of his coronary disease. He said no angina but does have some occasional feeling over his right chest is not exertion related. He still active working manually as well as on the treadmill. He has no symptoms of angina that time.  Laboratory data was done back in May and June. His hemoglobin A1c was 7. He's had difficulty losing weight.  We'll have to have carotid Dopplers this fall. He is still asymptomatic.  EKG today showed sinus bradycardia, otherwise normal Past Medical History  Diagnosis Date  . Hypertension   . Hyperlipidemia   . Diabetes mellitus   . Bradycardia   . Crohn's disease   . Neck pain   . Dysphagia   . Ankylosing spondylitis     Past Surgical History  Procedure Date  . Coronary artery bypass graft 04/19/1995    x7  . Umbilical hernia repair   . Trigger finger release     Family History  Problem Relation Age of Onset  . Heart disease    . Crohn's disease    . Colitis      History   Social History  . Marital Status: Married    Spouse Name: N/A    Number of Children: N/A  . Years of Education: N/A   Occupational History  . Not on file.   Social History Main Topics  . Smoking status: Former Smoker    Quit date: 01/02/1983  . Smokeless tobacco: Not on file  . Alcohol Use: Yes  . Drug Use: No  . Sexually Active: Not on file   Other Topics Concern  . Not on file   Social History Narrative  . No narrative on file    No Known Allergies  Current Outpatient Prescriptions  Medication Sig Dispense Refill  . aspirin 81 MG tablet Take 81 mg by mouth daily.        Marland Kitchen atorvastatin (LIPITOR) 80 MG tablet Take 80 mg by mouth daily.        . carvedilol (COREG) 6.25 MG tablet Take 6.25 mg by mouth 2 (two) times daily with a meal.        . escitalopram (LEXAPRO) 10 MG tablet Take 10 mg by mouth daily.       Marland Kitchen esomeprazole (NEXIUM) 40 MG capsule Take 40 mg by mouth as needed.        . folic  acid-pyridoxine-cyancobalamin (FOLTX) 2.5-25-2 MG TABS Take 1 tablet by mouth daily.  30 each  10  . LIALDA 1.2 G EC tablet TAKE (2) TABLETS DAILY.  60 each  6  . losartan (COZAAR) 100 MG tablet Take 100 mg by mouth daily.        . mercaptopurine (PURINETHOL) 50 MG tablet Take 50 mg by mouth daily. Give on an empty stomach 1 hour before or 2 hours after meals. Caution: Chemotherapy.       . metFORMIN (GLUCOPHAGE-XR) 500 MG 24 hr tablet Take 500 mg by mouth daily with breakfast.        . nitroGLYCERIN (NITROSTAT) 0.4 MG SL tablet Place 1 tablet (0.4 mg total) under the tongue every 5 (five) minutes as needed for chest pain.  100 tablet  3  . sildenafil (VIAGRA) 100 MG tablet Take 100 mg by mouth daily as needed.          ROS Negative other than HPI.   PE General Appearance: well developed, well nourished in no acute distress HEENT: symmetrical face, PERRLA, good  dentition  Neck: no JVD, thyromegaly, or adenopathy, trachea midline Chest: symmetric without deformity Cardiac: PMI non-displaced, RRR, normal S1, S2, no gallop or murmur Lung: clear to ausculation and percussion Vascular: all pulses full without bruits  Abdominal: nondistended, nontender, good bowel sounds, no HSM, no bruits Extremities: no cyanosis, clubbing or edema, no sign of DVT, no varicosities  Skin: normal color, no rashes Neuro: alert and oriented x 3, non-focal Pysch: normal affect Filed Vitals:   09/05/10 1006  BP: 132/66  Pulse: 51  Height: 5\' 8"  (1.727 m)  Weight: 208 lb 1.9 oz (94.403 kg)    EKG  Labs and Studies Reviewed.   Lab Results  Component Value Date   WBC 6.1 05/19/2010   HGB 14.4 05/19/2010   HCT 42.0 05/19/2010   MCV 101.3* 05/19/2010   PLT 151.0 05/19/2010      Chemistry      Component Value Date/Time   NA 142 05/30/2010 1025   K 4.7 05/30/2010 1025   CL 108 05/30/2010 1025   CO2 30 05/30/2010 1025   BUN 18 05/30/2010 1025   CREATININE 1.0 05/30/2010 1025      Component Value Date/Time     CALCIUM 8.8 05/30/2010 1025   ALKPHOS 43 05/19/2010 1018   AST 25 05/19/2010 1018   ALT 21 05/19/2010 1018   BILITOT 1.3* 05/19/2010 1018       Lab Results  Component Value Date   CHOL 148 05/19/2010   CHOL 159 06/07/2009   CHOL 137 06/07/2008   Lab Results  Component Value Date   HDL 80.40 05/19/2010   HDL 16.10 06/07/2009   HDL 66.20 06/07/2008   Lab Results  Component Value Date   LDLCALC 60 05/19/2010   LDLCALC 72 06/07/2009   LDLCALC 53 06/07/2008   Lab Results  Component Value Date   TRIG 38.0 05/19/2010   TRIG 74.0 06/07/2009   TRIG 87.0 06/07/2008   Lab Results  Component Value Date   CHOLHDL 2 05/19/2010   CHOLHDL 2 06/07/2009   CHOLHDL 2 06/07/2008   Lab Results  Component Value Date   HGBA1C 7.0* 05/19/2010   Lab Results  Component Value Date   ALT 21 05/19/2010   AST 25 05/19/2010   ALKPHOS 43 05/19/2010   BILITOT 1.3* 05/19/2010   Lab Results  Component Value Date   TSH 2.30 02/02/2010

## 2010-09-05 NOTE — Patient Instructions (Signed)
Your physician has requested that you have a carotid duplex. This test is an ultrasound of the carotid arteries in your neck. It looks at blood flow through these arteries that supply the brain with blood. Allow one hour for this exam. There are no restrictions or special instructions. October Your physician recommends that you schedule a follow-up appointment in: 6 months with Dr. Daleen Squibb

## 2010-09-07 ENCOUNTER — Encounter: Payer: Self-pay | Admitting: Gastroenterology

## 2010-09-07 ENCOUNTER — Ambulatory Visit (INDEPENDENT_AMBULATORY_CARE_PROVIDER_SITE_OTHER): Payer: Medicare Other | Admitting: Gastroenterology

## 2010-09-07 ENCOUNTER — Other Ambulatory Visit: Payer: Medicare Other

## 2010-09-07 VITALS — BP 140/62 | HR 60 | Ht 68.5 in | Wt 210.0 lb

## 2010-09-07 DIAGNOSIS — F1010 Alcohol abuse, uncomplicated: Secondary | ICD-10-CM

## 2010-09-07 DIAGNOSIS — K529 Noninfective gastroenteritis and colitis, unspecified: Secondary | ICD-10-CM

## 2010-09-07 DIAGNOSIS — M459 Ankylosing spondylitis of unspecified sites in spine: Secondary | ICD-10-CM

## 2010-09-07 DIAGNOSIS — F329 Major depressive disorder, single episode, unspecified: Secondary | ICD-10-CM

## 2010-09-07 DIAGNOSIS — K5289 Other specified noninfective gastroenteritis and colitis: Secondary | ICD-10-CM

## 2010-09-07 DIAGNOSIS — F3289 Other specified depressive episodes: Secondary | ICD-10-CM

## 2010-09-07 NOTE — Progress Notes (Signed)
History of Present Illness: This is a very nice 70 year old Caucasian male with chronic inflammatory bowel disease in remission for several years on oral aminosalicylates. Currently is asymptomatic in terms of any gastrointestinal symptoms. The patient is a chronic alcoholic who has associated rather severe depression refractory to Lexapro 10 mg a day. I have received multiple calls from his family about the patient's alcohol problems including drinking to a level of unconsciousness. Recent family vacation was markedly altered because of the patient's hostility which arises from alcohol abuse. He denies any history of DUI. His marriage is somewhat compromised because of his wife's concerns and the patient's inability to stop alcohol use. He saw Dr. Caralyn Guile in clinical psychology approximately one year ago. He has not had followup since that time. Has multiple cardiovascular issues managed by Dr. Valera Castle in cardiology. Patient is aware that he has no alcohol problem and is not interested in outpatient or inpatient treatment or AA meetings. He is severely depressed, and his wife relates that he has talked about suicide. The patient denies this today. He is recently retired and has little to do besides playing golf and drinking.   Current Medications, Allergies, Past Medical History, Past Surgical History, Family History and Social History were reviewed in [employer] record.   Assessment and plan:CDT level ordered to assess his level of alcohol intake so that this possibly can help [place] convince him of his alcohol problem. I spoke today with Dr. Dellia Cloud, Dr. Daleen Squibb, and I will speak with his wife. This patient definitely needs psychiatric referral if he will agree. If not, I think that interventional therapy is probably indicated. I have counseled him about stopping alcohol use , and generally  patients with depression did not improve until her alcoholism is treated. If we can,  however have him make an appointment with Dr. Betti Cruz ASAP. I've stopped his 6 MP medication but will continue oral aminosalicylates.  Encounter Diagnoses  Name Primary?  . Alcohol abuse Yes  . IBD (inflammatory bowel disease)   . Ankylosing spondylitis   . Depression

## 2010-09-07 NOTE — Patient Instructions (Signed)
Please go to the basement today for your labs.  Stop your 6MP.  

## 2010-09-15 ENCOUNTER — Telehealth: Payer: Self-pay | Admitting: *Deleted

## 2010-09-15 NOTE — Telephone Encounter (Signed)
Message copied by Florene Glen on Fri Sep 15, 2010  1:56 PM ------      Message from: Jarold Motto, DAVID R      Created: Fri Sep 15, 2010 12:38 PM       Please send a copy of this To Dr. Juanito Doom and Cardiology these results are consistent with heavy alcohol abuse. I have not heard from the patient or his wife concerning his alcohol issues and therapy.

## 2010-09-15 NOTE — Telephone Encounter (Signed)
Faxed/routed to Dr Juanito Doom.

## 2010-10-02 ENCOUNTER — Other Ambulatory Visit: Payer: Self-pay | Admitting: *Deleted

## 2010-10-02 MED ORDER — ATORVASTATIN CALCIUM 80 MG PO TABS: 80.0000 mg | ORAL_TABLET | Freq: Every day | ORAL | Status: DC

## 2010-10-03 ENCOUNTER — Encounter: Payer: Medicare Other | Admitting: *Deleted

## 2010-10-12 ENCOUNTER — Other Ambulatory Visit: Payer: Self-pay | Admitting: Dermatology

## 2010-10-20 ENCOUNTER — Other Ambulatory Visit: Payer: Self-pay

## 2010-10-20 MED ORDER — LOSARTAN POTASSIUM 100 MG PO TABS: 100.0000 mg | ORAL_TABLET | Freq: Every day | ORAL | Status: DC

## 2010-10-24 ENCOUNTER — Encounter: Payer: Medicare Other | Admitting: *Deleted

## 2010-11-06 NOTE — Telephone Encounter (Signed)
done

## 2010-11-13 ENCOUNTER — Encounter (INDEPENDENT_AMBULATORY_CARE_PROVIDER_SITE_OTHER): Payer: Medicare Other | Admitting: Cardiology

## 2010-11-13 DIAGNOSIS — I6529 Occlusion and stenosis of unspecified carotid artery: Secondary | ICD-10-CM

## 2010-11-15 ENCOUNTER — Telehealth: Payer: Self-pay | Admitting: Cardiology

## 2010-11-15 NOTE — Telephone Encounter (Signed)
Spoke with wife today about pt dizziness.  He experienced this when getting oob this morning.  Reviewed orthostatic precautions with her.  This was before taking morning medications.  She will keep a record of his blood pressure for the next couple of days. Also discussed pt awakening last week with nosebleed. They have gas heat - dry air- recommended humidifier and if needed saline nasal spray. Mylo Red RN

## 2010-11-15 NOTE — Telephone Encounter (Signed)
New message:  Pt has been having nose bleeds last week but for the last 2 days he has been extremely dizzy when he first gets up in the am.  This will go away after a short while.  Wife is concerned that it may be his blood pressure dropping.  No nausea with this feeling.  [phone] until 3:00  And then back around 5:00.

## 2010-11-16 ENCOUNTER — Telehealth: Payer: Self-pay | Admitting: Cardiology

## 2010-11-16 NOTE — Telephone Encounter (Signed)
Pt having dizziness, on two different BP meds

## 2010-11-16 NOTE — Telephone Encounter (Signed)
Mrs Sorlie is requesting a referral from Dr Daleen Squibb for a pcp at the [facility] office.  She is also wondering if he should see a pcp for this dizziness.

## 2010-11-17 NOTE — Telephone Encounter (Signed)
Dr. Daleen Squibb has returned call to pt. Mylo Red RN

## 2010-12-22 ENCOUNTER — Other Ambulatory Visit: Payer: Self-pay | Admitting: *Deleted

## 2010-12-22 MED ORDER — CARVEDILOL 6.25 MG PO TABS: 6.2500 mg | ORAL_TABLET | Freq: Two times a day (BID) | ORAL | Status: DC

## 2011-02-06 DIAGNOSIS — H25019 Cortical age-related cataract, unspecified eye: Secondary | ICD-10-CM | POA: Diagnosis not present

## 2011-02-06 DIAGNOSIS — E119 Type 2 diabetes mellitus without complications: Secondary | ICD-10-CM | POA: Diagnosis not present

## 2011-02-20 ENCOUNTER — Other Ambulatory Visit: Payer: Self-pay | Admitting: *Deleted

## 2011-02-20 MED ORDER — METFORMIN HCL ER 500 MG PO TB24: 500.0000 mg | ORAL_TABLET | Freq: Every day | ORAL | Status: DC

## 2011-03-21 ENCOUNTER — Ambulatory Visit: Payer: Medicare Other | Admitting: Cardiology

## 2011-03-21 ENCOUNTER — Encounter: Payer: Self-pay | Admitting: Cardiology

## 2011-03-21 ENCOUNTER — Ambulatory Visit (INDEPENDENT_AMBULATORY_CARE_PROVIDER_SITE_OTHER): Payer: Medicare Other | Admitting: Cardiology

## 2011-03-21 VITALS — BP 126/54 | HR 70 | Ht 68.0 in | Wt 214.0 lb

## 2011-03-21 DIAGNOSIS — E119 Type 2 diabetes mellitus without complications: Secondary | ICD-10-CM | POA: Diagnosis not present

## 2011-03-21 DIAGNOSIS — E785 Hyperlipidemia, unspecified: Secondary | ICD-10-CM

## 2011-03-21 DIAGNOSIS — I10 Essential (primary) hypertension: Secondary | ICD-10-CM | POA: Diagnosis not present

## 2011-03-21 DIAGNOSIS — Z951 Presence of aortocoronary bypass graft: Secondary | ICD-10-CM

## 2011-03-21 DIAGNOSIS — I6529 Occlusion and stenosis of unspecified carotid artery: Secondary | ICD-10-CM

## 2011-03-21 DIAGNOSIS — I2510 Atherosclerotic heart disease of native coronary artery without angina pectoris: Secondary | ICD-10-CM | POA: Diagnosis not present

## 2011-03-21 DIAGNOSIS — I252 Old myocardial infarction: Secondary | ICD-10-CM

## 2011-03-21 NOTE — Progress Notes (Signed)
HPI  Andres Smith in today for evaluation and management of his carotid artery disease, coronary artery disease with history bypass surgery, old myocardial infarction, normal overall left ventricular systolic function, hypertension, hyperlipidemia, and type 2 diabetes.  He rarely has any exertional chest discomfort. He has not been that active lately. He is gained about 10-12 pounds this winter.  He is compliant with his meds. He did blood work in May of this year. His last hemoglobin A1c was 7%. Lipid panel was at goal. Last carotid Doppler from November showed nonobstructive disease., repeat in 2 years. Last stress test was in 2011 and was low risk.  Past Medical History  Diagnosis Date  . Hypertension   . Hyperlipidemia   . Diabetes mellitus   . Bradycardia   . Crohn's disease   . Neck pain   . Dysphagia   . Ankylosing spondylitis     Current Outpatient Prescriptions  Medication Sig Dispense Refill  . aspirin 81 MG tablet Take 81 mg by mouth daily.        Marland Kitchen atorvastatin (LIPITOR) 80 MG tablet Take 1 tablet (80 mg total) by mouth daily.  30 tablet  6  . carvedilol (COREG) 6.25 MG tablet Take 1 tablet (6.25 mg total) by mouth 2 (two) times daily with a meal.  60 tablet  6  . folic acid-pyridoxine-cyancobalamin (FOLTX) 2.5-25-2 MG TABS Take 1 tablet by mouth daily.  30 each  10  . LIALDA 1.2 G EC tablet TAKE (2) TABLETS DAILY.  60 each  6  . losartan (COZAAR) 100 MG tablet Take 1 tablet (100 mg total) by mouth daily.  30 tablet  10  . metFORMIN (GLUCOPHAGE-XR) 500 MG 24 hr tablet Take 1 tablet (500 mg total) by mouth daily with breakfast.  30 tablet  2  . nitroGLYCERIN (NITROSTAT) 0.4 MG SL tablet Place 1 tablet (0.4 mg total) under the tongue every 5 (five) minutes as needed for chest pain.  100 tablet  3  . sildenafil (VIAGRA) 100 MG tablet Take 100 mg by mouth daily as needed.          No Known Allergies  Family History  Problem Relation Age of Onset  . Heart disease Father   .  Heart disease Mother   . Heart disease    . Heart disease    . Heart disease    . Crohn's disease    . Crohn's disease    . Colon cancer Neg Hx     History   Social History  . Marital Status: Married    Spouse Name: N/A    Number of Children: 2  . Years of Education: N/A   Occupational History  . semi retired    Social History Main Topics  . Smoking status: Former Smoker    Quit date: 01/02/1983  . Smokeless tobacco: Not on file  . Alcohol Use: Yes  . Drug Use: No  . Sexually Active: Not on file   Other Topics Concern  . Not on file   Social History Narrative  . No narrative on file    ROS ALL NEGATIVE EXCEPT THOSE NOTED IN HPI  PE  General Appearance: well developed, well nourished in no acute distressOverweight HEENT: symmetrical face, PERRLA, good dentition  Neck: no JVD, thyromegaly, or adenopathy, trachea midline Chest: symmetric without deformity Cardiac: PMI non-displaced, RRR, normal S1, S2, no gallop or murmur Lung: clear to ausculation and percussion Vascular: all pulses full without bruits  Abdominal: nondistended,  nontender, good bowel sounds, no HSM, no bruits Extremities: no cyanosis, clubbing or edema, no sign of DVT, no varicosities  Skin: normal color, no rashes Neuro: alert and oriented x 3, non-focal Pysch: normal affect  EKG  BMET    Component Value Date/Time   NA 142 05/30/2010 1025   K 4.7 05/30/2010 1025   CL 108 05/30/2010 1025   CO2 30 05/30/2010 1025   GLUCOSE 143* 05/30/2010 1025   GLUCOSE 98 12/12/2005 0729   BUN 18 05/30/2010 1025   CREATININE 1.0 05/30/2010 1025   CALCIUM 8.8 05/30/2010 1025   GFRNONAA 87.61 06/07/2009 0000   GFRAA 86 12/30/2007 0757    Lipid Panel     Component Value Date/Time   CHOL 148 05/19/2010 1018   TRIG 38.0 05/19/2010 1018   HDL 80.40 05/19/2010 1018   CHOLHDL 2 05/19/2010 1018   VLDL 7.6 05/19/2010 1018   LDLCALC 60 05/19/2010 1018    CBC    Component Value Date/Time   WBC 6.1 05/19/2010 1018     RBC 4.14* 05/19/2010 1018   HGB 14.4 05/19/2010 1018   HCT 42.0 05/19/2010 1018   PLT 151.0 05/19/2010 1018   MCV 101.3* 05/19/2010 1018   MCHC 34.3 05/19/2010 1018   RDW 14.7* 05/19/2010 1018   LYMPHSABS 1.2 05/19/2010 1018   MONOABS 0.7 05/19/2010 1018   EOSABS 0.1 05/19/2010 1018   BASOSABS 0.0 05/19/2010 1018

## 2011-03-21 NOTE — Assessment & Plan Note (Signed)
Stable. Repeat in November of 2014

## 2011-03-21 NOTE — Assessment & Plan Note (Signed)
Her last hemoglobin A1c was 7.0%. Recheck in May.

## 2011-03-21 NOTE — Patient Instructions (Signed)
Your physician recommends that you return for fasting lab work in: May 2013  Your physician wants you to follow-up in: 6 months.  You will receive a reminder letter in the mail two months in advance. If you don't receive a letter, please call our office to schedule the follow-up appointment.

## 2011-03-21 NOTE — Assessment & Plan Note (Signed)
He in good control. No change in medical therapy.

## 2011-03-21 NOTE — Assessment & Plan Note (Signed)
Stable. Continue secondary preventative therapy. 

## 2011-03-21 NOTE — Assessment & Plan Note (Signed)
Check lipid panel and comprehensive metabolic profile in May.

## 2011-04-16 DIAGNOSIS — Z85828 Personal history of other malignant neoplasm of skin: Secondary | ICD-10-CM | POA: Diagnosis not present

## 2011-04-16 DIAGNOSIS — L570 Actinic keratosis: Secondary | ICD-10-CM | POA: Diagnosis not present

## 2011-04-16 DIAGNOSIS — L821 Other seborrheic keratosis: Secondary | ICD-10-CM | POA: Diagnosis not present

## 2011-05-15 ENCOUNTER — Other Ambulatory Visit: Payer: Medicare Other

## 2011-05-16 ENCOUNTER — Other Ambulatory Visit: Payer: Self-pay | Admitting: Cardiology

## 2011-05-16 ENCOUNTER — Other Ambulatory Visit (INDEPENDENT_AMBULATORY_CARE_PROVIDER_SITE_OTHER): Payer: Medicare Other

## 2011-05-16 DIAGNOSIS — E785 Hyperlipidemia, unspecified: Secondary | ICD-10-CM

## 2011-05-16 DIAGNOSIS — E119 Type 2 diabetes mellitus without complications: Secondary | ICD-10-CM | POA: Diagnosis not present

## 2011-05-16 DIAGNOSIS — I10 Essential (primary) hypertension: Secondary | ICD-10-CM | POA: Diagnosis not present

## 2011-05-16 LAB — COMPREHENSIVE METABOLIC PANEL
ALT: 21 U/L (ref 0–53)
Alkaline Phosphatase: 43 U/L (ref 39–117)
CO2: 27 mEq/L (ref 19–32)
Creatinine, Ser: 0.9 mg/dL (ref 0.4–1.5)
GFR: 94.26 mL/min (ref >60.00)
Sodium: 140 mEq/L (ref 135–145)
Total Bilirubin: 0.7 mg/dL (ref 0.3–1.2)
Total Protein: 6.9 g/dL (ref 6.0–8.3)

## 2011-05-16 LAB — LIPID PANEL
HDL: 63.8 mg/dL (ref >39.00)
LDL Cholesterol: 67 mg/dL (ref 0–99)
Total CHOL/HDL Ratio: 2
Triglycerides: 134 mg/dL (ref 0.0–149.0)
VLDL: 26.8 mg/dL (ref 0.0–40.0)

## 2011-05-16 MED ORDER — ATORVASTATIN CALCIUM 80 MG PO TABS: 80.0000 mg | ORAL_TABLET | Freq: Every day | ORAL | Status: DC

## 2011-05-21 ENCOUNTER — Other Ambulatory Visit: Payer: Self-pay | Admitting: *Deleted

## 2011-05-21 MED ORDER — MESALAMINE 1.2 G PO TBEC: DELAYED_RELEASE_TABLET | ORAL | Status: DC

## 2011-05-23 ENCOUNTER — Inpatient Hospital Stay (HOSPITAL_COMMUNITY)
Admission: AD | Admit: 2011-05-23 | Discharge: 2011-05-24 | DRG: 287 | Disposition: A | Discharge status: Home / Self Care | Payer: Medicare Other | Source: Other Acute Inpatient Hospital | Attending: Cardiology | Admitting: Cardiology

## 2011-05-23 DIAGNOSIS — I2510 Atherosclerotic heart disease of native coronary artery without angina pectoris: Secondary | ICD-10-CM | POA: Diagnosis not present

## 2011-05-23 DIAGNOSIS — I10 Essential (primary) hypertension: Secondary | ICD-10-CM | POA: Diagnosis not present

## 2011-05-23 DIAGNOSIS — F17200 Nicotine dependence, unspecified, uncomplicated: Secondary | ICD-10-CM | POA: Diagnosis not present

## 2011-05-23 DIAGNOSIS — Z7982 Long term (current) use of aspirin: Secondary | ICD-10-CM | POA: Diagnosis not present

## 2011-05-23 DIAGNOSIS — E119 Type 2 diabetes mellitus without complications: Secondary | ICD-10-CM | POA: Diagnosis not present

## 2011-05-23 DIAGNOSIS — I517 Cardiomegaly: Secondary | ICD-10-CM | POA: Diagnosis not present

## 2011-05-23 DIAGNOSIS — R079 Chest pain, unspecified: Secondary | ICD-10-CM | POA: Diagnosis not present

## 2011-05-23 DIAGNOSIS — Z79899 Other long term (current) drug therapy: Secondary | ICD-10-CM

## 2011-05-23 DIAGNOSIS — K5090 Crohn's disease, unspecified, without complications: Secondary | ICD-10-CM | POA: Diagnosis present

## 2011-05-23 DIAGNOSIS — E118 Type 2 diabetes mellitus with unspecified complications: Secondary | ICD-10-CM | POA: Insufficient documentation

## 2011-05-23 DIAGNOSIS — F329 Major depressive disorder, single episode, unspecified: Secondary | ICD-10-CM | POA: Insufficient documentation

## 2011-05-23 DIAGNOSIS — Z951 Presence of aortocoronary bypass graft: Secondary | ICD-10-CM

## 2011-05-23 DIAGNOSIS — I200 Unstable angina: Secondary | ICD-10-CM | POA: Diagnosis present

## 2011-05-23 DIAGNOSIS — E785 Hyperlipidemia, unspecified: Secondary | ICD-10-CM | POA: Insufficient documentation

## 2011-05-23 DIAGNOSIS — I2582 Chronic total occlusion of coronary artery: Secondary | ICD-10-CM | POA: Diagnosis present

## 2011-05-23 DIAGNOSIS — K219 Gastro-esophageal reflux disease without esophagitis: Secondary | ICD-10-CM | POA: Insufficient documentation

## 2011-05-23 HISTORY — DX: Atherosclerotic heart disease of native coronary artery without angina pectoris: I25.10

## 2011-05-23 HISTORY — DX: Malignant (primary) neoplasm, unspecified: C80.1

## 2011-05-23 HISTORY — DX: Gastro-esophageal reflux disease without esophagitis: K21.9

## 2011-05-23 HISTORY — DX: Encounter for other specified aftercare: Z51.89

## 2011-05-23 HISTORY — DX: Reserved for inherently not codable concepts without codable children: IMO0001

## 2011-05-23 HISTORY — DX: Pneumonia, unspecified organism: J18.9

## 2011-05-23 HISTORY — DX: Calculus of kidney: N20.0

## 2011-05-24 ENCOUNTER — Encounter (HOSPITAL_COMMUNITY)
Admission: AD | Disposition: A | Discharge status: Home / Self Care | Payer: Self-pay | Source: Other Acute Inpatient Hospital | Attending: Cardiology

## 2011-05-24 ENCOUNTER — Encounter (HOSPITAL_COMMUNITY): Payer: Self-pay | Admitting: General Practice

## 2011-05-24 DIAGNOSIS — I517 Cardiomegaly: Secondary | ICD-10-CM

## 2011-05-24 DIAGNOSIS — I200 Unstable angina: Secondary | ICD-10-CM

## 2011-05-24 DIAGNOSIS — R079 Chest pain, unspecified: Secondary | ICD-10-CM

## 2011-05-24 DIAGNOSIS — I2510 Atherosclerotic heart disease of native coronary artery without angina pectoris: Secondary | ICD-10-CM

## 2011-05-24 HISTORY — PX: LEFT HEART CATHETERIZATION WITH CORONARY/GRAFT ANGIOGRAM: SHX5450

## 2011-05-24 LAB — CBC
HCT: 33.1 % — ABNORMAL LOW (ref 39.0–52.0)
Hemoglobin: 12.9 g/dL — ABNORMAL LOW (ref 13.0–17.0)
MCHC: 34.7 g/dL (ref 30.0–36.0)
Platelets: 88 10*3/uL — ABNORMAL LOW (ref 150–400)
RBC: 4 MIL/uL — ABNORMAL LOW (ref 4.22–5.81)
RDW: 13.5 % (ref 11.5–15.5)
WBC: 5 10*3/uL (ref 4.0–10.5)
WBC: 3.7 10*3/uL — ABNORMAL LOW (ref 4.0–10.5)

## 2011-05-24 LAB — CREATININE, SERUM
Creatinine, Ser: 0.53 mg/dL (ref 0.50–1.35)
GFR calc Af Amer: >90 mL/min (ref >90)
GFR calc non Af Amer: >90 mL/min (ref >90)

## 2011-05-24 LAB — GLUCOSE, CAPILLARY
Glucose-Capillary: 125 mg/dL — ABNORMAL HIGH (ref 70–99)
Glucose-Capillary: 122 mg/dL — ABNORMAL HIGH (ref 70–99)
Glucose-Capillary: 131 mg/dL — ABNORMAL HIGH (ref 70–99)
Glucose-Capillary: 170 mg/dL — ABNORMAL HIGH (ref 70–99)

## 2011-05-24 LAB — CARDIAC PANEL(CRET KIN+CKTOT+MB+TROPI)
CK, MB: 2.1 ng/mL (ref 0.3–4.0)
Relative Index: INVALID (ref 0.0–2.5)
Relative Index: 1.2 (ref 0.0–2.5)
Total CK: 94 U/L (ref 7–232)
Troponin I: <0.3 ng/mL (ref <0.30)

## 2011-05-24 LAB — BASIC METABOLIC PANEL
CO2: 28 mEq/L (ref 19–32)
Chloride: 103 mEq/L (ref 96–112)
GFR calc non Af Amer: 81 mL/min — ABNORMAL LOW (ref >90)
Glucose, Bld: 235 mg/dL — ABNORMAL HIGH (ref 70–99)
Potassium: 3.6 mEq/L (ref 3.5–5.1)
Sodium: 139 mEq/L (ref 135–145)

## 2011-05-24 SURGERY — LEFT HEART CATHETERIZATION WITH CORONARY/GRAFT ANGIOGRAM

## 2011-05-24 MED ORDER — NITROGLYCERIN 0.4 MG SL SUBL: 0.4000 mg | SUBLINGUAL_TABLET | SUBLINGUAL | Status: DC | PRN

## 2011-05-24 MED ORDER — ONDANSETRON HCL 4 MG/2ML IJ SOLN: 4.0000 mg | Freq: Four times a day (QID) | INTRAMUSCULAR | Status: DC | PRN

## 2011-05-24 MED ORDER — ACETAMINOPHEN 325 MG PO TABS: 650.0000 mg | ORAL_TABLET | ORAL | Status: DC | PRN

## 2011-05-24 MED ORDER — SODIUM CHLORIDE 0.9 % IV SOLN: 250.0000 mL | INTRAVENOUS | Status: DC | PRN

## 2011-05-24 MED ORDER — LIDOCAINE HCL (PF) 1 % IJ SOLN
INTRAMUSCULAR | Status: AC
  Filled 2011-05-24: qty 30

## 2011-05-24 MED ORDER — ASPIRIN EC 81 MG PO TBEC: 81.0000 mg | DELAYED_RELEASE_TABLET | Freq: Every day | ORAL | Status: DC

## 2011-05-24 MED ORDER — HEPARIN (PORCINE) IN NACL 100-0.45 UNIT/ML-% IJ SOLN
1350.0000 [IU]/h | INTRAMUSCULAR | Status: DC
  Administered 2011-05-24: 1350 [IU]/h via INTRAVENOUS
  Filled 2011-05-24 (×2): qty 250

## 2011-05-24 MED ORDER — MIDAZOLAM HCL 2 MG/2ML IJ SOLN
INTRAMUSCULAR | Status: AC
  Filled 2011-05-24: qty 2

## 2011-05-24 MED ORDER — HEPARIN SODIUM (PORCINE) 5000 UNIT/ML IJ SOLN
5000.0000 [IU] | Freq: Three times a day (TID) | INTRAMUSCULAR | Status: DC
  Filled 2011-05-24 (×2): qty 1

## 2011-05-24 MED ORDER — ISOSORBIDE MONONITRATE ER 30 MG PO TB24: 30.0000 mg | ORAL_TABLET | Freq: Every day | ORAL | Status: DC

## 2011-05-24 MED ORDER — FENTANYL CITRATE 0.05 MG/ML IJ SOLN
INTRAMUSCULAR | Status: AC
  Filled 2011-05-24: qty 2

## 2011-05-24 MED ORDER — FA-PYRIDOXINE-CYANOCOBALAMIN 2.5-25-2 MG PO TABS
1.0000 | ORAL_TABLET | Freq: Every day | ORAL | Status: DC
  Administered 2011-05-24: 1 via ORAL
  Filled 2011-05-24: qty 1

## 2011-05-24 MED ORDER — ISOSORBIDE MONONITRATE ER 30 MG PO TB24
30.0000 mg | ORAL_TABLET | Freq: Every day | ORAL | Status: DC
  Administered 2011-05-24: 30 mg via ORAL
  Filled 2011-05-24: qty 1

## 2011-05-24 MED ORDER — NITROGLYCERIN 0.2 MG/ML ON CALL CATH LAB
INTRAVENOUS | Status: AC
  Filled 2011-05-24: qty 1

## 2011-05-24 MED ORDER — SODIUM CHLORIDE 0.9 % IV SOLN
INTRAVENOUS | Status: DC
  Administered 2011-05-24: 07:00:00 via INTRAVENOUS

## 2011-05-24 MED ORDER — SODIUM CHLORIDE 0.9 % IJ SOLN: 3.0000 mL | INTRAMUSCULAR | Status: DC | PRN

## 2011-05-24 MED ORDER — ATORVASTATIN CALCIUM 80 MG PO TABS
80.0000 mg | ORAL_TABLET | Freq: Every day | ORAL | Status: DC
Start: 2011-05-24 — End: 2011-05-24
  Administered 2011-05-24: 80 mg via ORAL
  Filled 2011-05-24: qty 1

## 2011-05-24 MED ORDER — MESALAMINE 1.2 G PO TBEC
1200.0000 mg | DELAYED_RELEASE_TABLET | Freq: Two times a day (BID) | ORAL | Status: DC
  Administered 2011-05-24 (×2): 1.2 g via ORAL
  Filled 2011-05-24 (×3): qty 1

## 2011-05-24 MED ORDER — ASPIRIN EC 81 MG PO TBEC
81.0000 mg | DELAYED_RELEASE_TABLET | Freq: Every day | ORAL | Status: DC
  Filled 2011-05-24: qty 1

## 2011-05-24 MED ORDER — LOSARTAN POTASSIUM 50 MG PO TABS
100.0000 mg | ORAL_TABLET | Freq: Every day | ORAL | Status: DC
  Administered 2011-05-24: 100 mg via ORAL
  Filled 2011-05-24: qty 2

## 2011-05-24 MED ORDER — METFORMIN HCL ER 500 MG PO TB24: 500.0000 mg | ORAL_TABLET | Freq: Every day | ORAL | Status: DC

## 2011-05-24 MED ORDER — SODIUM CHLORIDE 0.9 % IJ SOLN: 3.0000 mL | Freq: Two times a day (BID) | INTRAMUSCULAR | Status: DC

## 2011-05-24 MED ORDER — SODIUM CHLORIDE 0.9 % IV SOLN
INTRAVENOUS | Status: AC
  Administered 2011-05-24: 15:00:00 via INTRAVENOUS

## 2011-05-24 MED ORDER — HEPARIN (PORCINE) IN NACL 2-0.9 UNIT/ML-% IJ SOLN
INTRAMUSCULAR | Status: AC
  Filled 2011-05-24: qty 1000

## 2011-05-24 MED ORDER — ASPIRIN 81 MG PO CHEW
324.0000 mg | CHEWABLE_TABLET | ORAL | Status: AC
  Administered 2011-05-24: 324 mg via ORAL
  Filled 2011-05-24: qty 4

## 2011-05-24 MED ORDER — INSULIN ASPART 100 UNIT/ML ~~LOC~~ SOLN
0.0000 [IU] | Freq: Three times a day (TID) | SUBCUTANEOUS | Status: DC
  Administered 2011-05-24: 2 [IU] via SUBCUTANEOUS
  Administered 2011-05-24: 3 [IU] via SUBCUTANEOUS
  Administered 2011-05-24: 2 [IU] via SUBCUTANEOUS

## 2011-05-24 NOTE — Progress Notes (Signed)
Patient had a 5 beat run of Vtach, patient asymptomatic and resting. HR increased from the 40-50's to 60's. EKG showed Sinus Huston Foley. Dr. On call notified. Will continue to monitor patient.

## 2011-05-24 NOTE — Progress Notes (Signed)
  Echocardiogram 2D Echocardiogram has been performed.  Emelia Loron A 05/24/2011, 3:45 PM

## 2011-05-24 NOTE — Discharge Instructions (Signed)

## 2011-05-24 NOTE — H&P (Signed)
Physician History and Physical    Andres ZACHARIA MRN: [id number] DOB/AGE: [birth year] 72 y.o. Admit date: 05/23/2011  Primary Cardiologist:  Dr. Daleen Squibb  CC:  Chest pain  HPI: Pt is a 71 yo man with CAD s/p CABG 1997, DM, HTN, HLD who presents with chest pain.  He was doing some work on his house in [place], [place] when he started having chest pain.  The pain first started yesterday while he was fixing a window.  He was doing a lot of walking back and forth and began having substernal chest pressure assoc with SOB.  The pain subsided with resting.  He had 2-3 episodes yesterday.  The pain continued today and it was so bad that he had to take a nitro tablet, so he went to the ED in [place] and then was transferred here.  The nitro did relieve the pain.  He was given lovenox, aspirin and nitro paste there.  He reports that his sx are the exact same compared to what he was experiencing prior to his CABG.  He has not had any sx since his CABG, this is his first episode of chest pain since then.    Review of systems: A review of 10 organ systems was done and is negative except as stated above in HPI.  No f, c, n, v, d, abd pain, syncope, palpitations, LE edema, PND, orthopnea, headache, rash  Past Medical History  Diagnosis Date  . Hypertension   . Hyperlipidemia   . Diabetes mellitus   . Bradycardia   . Crohn's disease   . Neck pain   . Dysphagia   . Ankylosing spondylitis    Past Surgical History  Procedure Date  . Coronary artery bypass graft 04/19/1995    x7  . Umbilical hernia repair   . Trigger finger release    History   Social History  . Marital Status: Married    Spouse Name: N/A    Number of Children: 2  . Years of Education: N/A   Occupational History  . semi retired    Social History Main Topics  . Smoking status: Former Smoker    Quit date: 01/02/1983  . Smokeless tobacco: Not on file  . Alcohol Use: Yes  . Drug Use: No  . Sexually Active: Not on file   Other Topics  Concern  . Not on file   Social History Narrative  . No narrative on file    Family History  Problem Relation Age of Onset  . Heart disease Father   . Heart disease Mother   . Heart disease    . Heart disease    . Heart disease    . Crohn's disease    . Crohn's disease    . Colon cancer Neg Hx      No Known Allergies  Prescriptions prior to admission  Medication Sig Dispense Refill  . aspirin 81 MG tablet Take 81 mg by mouth daily.        Marland Kitchen atorvastatin (LIPITOR) 80 MG tablet Take 80 mg by mouth daily.      . carvedilol (COREG) 6.25 MG tablet Take 6.25 mg by mouth 2 (two) times daily with a meal.      . folic acid-pyridoxine-cyancobalamin (FOLTX) 2.5-25-2 MG TABS Take 1 tablet by mouth daily.      Marland Kitchen losartan (COZAAR) 100 MG tablet Take 100 mg by mouth daily.      . mesalamine (LIALDA) 1.2 G  EC tablet Take 1,200 mg by mouth 2 (two) times daily.       . metFORMIN (GLUCOPHAGE-XR) 500 MG 24 hr tablet Take 500 mg by mouth daily with breakfast.      . nitroGLYCERIN (NITROSTAT) 0.4 MG SL tablet Place 0.4 mg under the tongue every 5 (five) minutes as needed. For chest pain      . sildenafil (VIAGRA) 100 MG tablet Take 100 mg by mouth daily as needed. For erectile dysfunction      . DISCONTD: atorvastatin (LIPITOR) 80 MG tablet Take 1 tablet (80 mg total) by mouth daily.  30 tablet  5  . DISCONTD: carvedilol (COREG) 6.25 MG tablet Take 1 tablet (6.25 mg total) by mouth 2 (two) times daily with a meal.  60 tablet  6  . DISCONTD: folic acid-pyridoxine-cyancobalamin (FOLTX) 2.5-25-2 MG TABS Take 1 tablet by mouth daily.  30 each  10  . DISCONTD: losartan (COZAAR) 100 MG tablet Take 1 tablet (100 mg total) by mouth daily.  30 tablet  10  . DISCONTD: metFORMIN (GLUCOPHAGE-XR) 500 MG 24 hr tablet Take 1 tablet (500 mg total) by mouth daily with breakfast.  30 tablet  2  . DISCONTD: nitroGLYCERIN (NITROSTAT) 0.4 MG SL tablet Place 1 tablet (0.4 mg total) under the tongue every 5 (five) minutes as  needed for chest pain.  100 tablet  3  . DISCONTD: mesalamine (LIALDA) 1.2 G EC tablet Take two tablets by mouth once a day........Marland Kitchen MUST HAVE OFFICE VISIT  60 tablet  0      Physical Exam: Blood pressure 123/52, pulse 48, temperature 97.8 F (36.6 C), temperature source Oral, resp. rate 18, height 5\' 9"  (1.753 m), weight 94.5 kg (208 lb 5.4 oz), SpO2 98.00%.; Body mass index is 30.77 kg/(m^2). Temp:  [97.8 F (36.6 C)] 97.8 F (36.6 C) (05/22 2200) Pulse Rate:  [48-55] 48  (05/22 2200) Resp:  [17-18] 18  (05/22 2200) BP: (123-149)/(52-78) 123/52 mmHg (05/22 2200) SpO2:  [98 %] 98 % (05/22 2200) Weight:  [94.5 kg (208 lb 5.4 oz)] 94.5 kg (208 lb 5.4 oz) (05/22 2100)  No intake or output data in the 24 hours ending 05/24/11 0021 General: NAD Heent: MMM Neck: No JVD  CV: RRR, nl S1/S2, no S3/S4, 2/6 SEM Lungs: Clear to auscultation bilaterally with normal respiratory effort Abdomen: Soft, nontender, nondistended Extremities: No clubbing or cyanosis.  No pedal edema Skin: Intact without lesions or rashes  Neurologic: Alert and oriented x 3, grossly nonfocal  Psych: Normal mood and affect    Labs: No results found for this basename: CKTOTAL:4,CKMB:4,TROPONINI:4 in the last 72 hours Lab Results  Component Value Date   WBC 6.1 05/19/2010   HGB 14.4 05/19/2010   HCT 42.0 05/19/2010   MCV 101.3* 05/19/2010   PLT 151.0 05/19/2010   No results found for this basename: NA,K,CL,CO2,BUN,CREATININE,CALCIUM,LABALBU,PROT,BILITOT,ALKPHOS,ALT,AST,GLUCOSE in the last 168 hours Lab Results  Component Value Date   CHOL 158 05/16/2011   HDL 63.80 05/16/2011   LDLCALC 67 05/16/2011   TRIG 134.0 05/16/2011   OSH labs from 05/23/11 Cr 0.97 K 4.3 Hgb 15 CK/MB 94/0.9  Trop 0.014 INR 1.0  CXR cardiomegaly, no acute findings   EKG on admission: Sinus brady, no acute ischemic changes  OSH EKG sinus with PVCs, no acute ST changes   ASSESSMENT:  Pt is a 71 yo man with CAD s/p CABG 1997, DM, HTN,  HLD who presents with chest pain.  PLAN:  Chest pain - pt's sx  are consistent with UA.  Enzymes at OSH negative x 1.  Check serial cardiac enzymes.  EKG with no acute ischemic changes.  He was given lovenox 80 mg at 4 pm 5/22 at OSH - per discussion with pharmacy, this will last 10-12h - will start heparin at 4am and continue until cath.  Nitro prn pain.  Resting HR is 49, will not add BB (pt has been on coreg in past, but is not currently taking).  Continue lipitor and daily aspirin.  Monitor on tele.  Check echo.    DM - hold metformin.  SSI  HLD - LDL 67.  Continue statin  HTN - continue ARB  Crohn's - continue home meds  Dispo - admit, plan for cath in am   Signed: Hilary Hertz, MD Cardiology Fellow 05/24/2011, 12:21 AM

## 2011-05-24 NOTE — Discharge Summary (Signed)
Patient ID: Andres Smith,  MRN: [id number], DOB/AGE: [birth year] 71 y.o.  Admit date: 05/23/2011 Discharge date: 05/24/2011  Primary Cardiologist: T. Wall, MD  Discharge Diagnoses Principal Problem:  *Unstable angina Active Problems:  DIABETES MELLITUS, TYPE II, MILD  HYPERLIPIDEMIA, ATHEROGENIC  Essential hypertension, benign  CAD, NATIVE VESSEL  GERD  DEPRESSION   Allergies No Known Allergies  Procedures  Cardiac Catheterization 05/24/2011  Hemodynamics:  AO 131/60 LV 130/16              Coronary angiography: Coronary dominance: right  Left mainstem: 40% distal LM stenosis.   Left anterior descending (LAD): Total occlusion of the mid LAD just after a moderate 2nd diagonal with 70-80% ostial stenosis.  Patent sequential LIMA to D2 and LAD (touching initially on D2).  90% stenosis in the proximal D2 just distal to the LIMA touchdown.  The LAD beyond the LIMA touchdown has mild disease.     Left circumflex (LCx): Totally occluded proximal LCx.  Sequential SVG to ramus/OM1/OM2 was patent.  The native vessels were relatively small.    Right coronary artery (RCA): There was a 99% stenosis in the proximal RCA.  The RCA then gave rise to 3 small acute marginals.  The distal RCA was totally occluded.  Sequential SVG to PDA and PLV was patent.  There was a 60-70% stenosis in the mid PDA beyond the SVG touchdown.     Left ventriculography: Hand LV-gram.  EF 50%.     Final Conclusions:  Grafts are patent.  There are two areas that could be causing ischemia.  One is the 99% stenosis in the proximal RCA.  The at-risk area here are 3 small acute marginals prior the distal RCA occlusion.  I do not think intervention here is worthwhile.  The second area is D2 which has a 90% stenosis after LIMA touchdown.  PCI here would be feasible and this could be the culprit for his angina.  However, the vessel is small and borderline for stenting.     Recommendations: Medical management is appropriate  here.  Cardiac enzymes were negative. HR is too low for beta blocker, will add Imdur 30 mg daily. _____________  2D Echocardiogram Study Conclusions  - Left ventricle: Technically difficult study. Probable   hypokinesis of the inferior wall. The cavity size was   normal. Wall thickness was normal. The estimated ejection   fraction was 55%. Images were inadequate for LV wall   motion assessment. 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 - Aortic valve: Sclerosis without stenosis. - Right ventricle: The cavity size was mildly dilated.   Systolic function was mildly reduced. _____________  History of Present Illness  71 y/o male with the above complex problem list.  He was in his USOH until the day prior to admission when he began to experience intermittent exertional chest pain associated with sob while doing some work around his house.  Ss recurred on 5/22 prompting him to present to the ED in [place], [place] where he was treated with asa, ntg, lovenox, and ntg paste - w/o immediate relief of chest pain.  He was transferred to [facility] for further evaluation. Hospital Course  Pt r/o for MI.  Decision was made to pursue diagnostic cardiac catheterization and this was performed this afternoon revealing 6/6 patent grafts with severe multivessel native CAD.  The RCA and second Diagonal were felt to be possible areas of ischemia however neither were felt to be ideal for PCI. Therefor, decision was made  to pursue medical therapy and Imdur 30mg  Daily has been initiated.  An echo was done post-cath and revealed normal LV function.    Pt has been ambulating w/o recurrent symptoms and will be discharged this evening.  Discharge Vitals Blood pressure 132/68, pulse 58, temperature 97.6 F (36.4 C), temperature source Oral, resp. rate 18, height 5\' 9"  (1.753 m), weight 207 lb 11.2 oz (94.212 kg), SpO2 97.00%.  Filed Weights   05/23/11 2100 05/24/11 0744  Weight: 208 lb 5.4 oz (94.5  kg) 207 lb 11.2 oz (94.212 kg)   Labs  CBC  Basename 05/24/11 1244 05/24/11 0200  WBC 3.7* 5.0  NEUTROABS -- --  HGB 11.2* 12.9*  HCT 33.1* 37.2*  MCV 94.3 93.0  PLT 88* 124*   Basic Metabolic Panel  Basename 05/24/11 1244 05/24/11 0200  NA -- 139  K -- 3.6  CL -- 103  CO2 -- 28  GLUCOSE -- 235*  BUN -- 18  CREATININE 0.53 0.98  CALCIUM -- 8.8  MG -- --  PHOS -- --    Basename 05/24/11 1244 05/24/11 0710 05/24/11 0149  CKTOTAL 145 91 94  CKMB 1.7 2.1 2.1  CKMBINDEX -- -- --  TROPONINI HEMOLYSIS AT THIS LEVEL MAY AFFECT RESULT <0.30 <0.30   Disposition  Pt is being discharged home today in good condition.  Follow-up Plans & Appointments  Follow-up Information    Follow up with Valera Castle, MD in 2 weeks. (We will arrange.)    Contact information:   [address] [place] [address] [phone]          Discharge Medications  Medication List  As of 05/24/2011  6:42 PM   STOP taking these medications         sildenafil 100 MG tablet         TAKE these medications         aspirin 81 MG tablet   Take 81 mg by mouth daily.      atorvastatin 80 MG tablet   Commonly known as: LIPITOR   Take 80 mg by mouth daily.      carvedilol 6.25 MG tablet   Commonly known as: COREG   Take 6.25 mg by mouth 2 (two) times daily with a meal.      folic acid-pyridoxine-cyancobalamin 2.5-25-2 MG Tabs   Commonly known as: FOLTX   Take 1 tablet by mouth daily.      isosorbide mononitrate 30 MG 24 hr tablet   Commonly known as: IMDUR   Take 1 tablet (30 mg total) by mouth daily.      losartan 100 MG tablet   Commonly known as: COZAAR   Take 100 mg by mouth daily.      mesalamine 1.2 G EC tablet   Commonly known as: LIALDA   Take 1,200 mg by mouth 2 (two) times daily.      metFORMIN 500 MG 24 hr tablet   Commonly known as: GLUCOPHAGE-XR   Take 1 tablet (500 mg total) by mouth daily with breakfast.       nitroGLYCERIN 0.4 MG SL tablet   Commonly known as: NITROSTAT   Place 0.4 mg under the tongue every 5 (five) minutes as needed. For chest pain            Outstanding Labs/Studies  None  Duration of Discharge Encounter   Greater than 30 minutes including physician time.  Signed, Nicolasa Ducking NP 05/24/2011, 6:42  PM

## 2011-05-24 NOTE — Progress Notes (Signed)
UR Completed Ivie Savitt Graves-Bigelow, RN,BSN [phone]  

## 2011-05-24 NOTE — CV Procedure (Signed)
   Cardiac Catheterization Procedure Note  Name: Andres Smith MRN: [id number] DOB: [birth year]  Procedure: Left Heart Cath, Selective Coronary Angiography, LV angiography, SVG angiography, LIMA angiography, Angioseal deployment.   Indication: Unstable angina.    Procedural details: The right groin was prepped, draped, and anesthetized with 1% lidocaine. Using modified Seldinger technique, a 5 French sheath was introduced into the right femoral artery. MP catheter, JL-4, and LIMA catheter were used for coronary angiography and left ventriculography. Catheter exchanges were performed over a guidewire. Angioseal was deployed.  There were no immediate procedural complications. The patient was transferred to the post catheterization recovery area for further monitoring.  Procedural Findings: Hemodynamics:  AO 131/60 LV 130/16   Coronary angiography: Coronary dominance: right  Left mainstem: 40% distal LM stenosis.   Left anterior descending (LAD): Total occlusion of the mid LAD just after a moderate 2nd diagonal with 70-80% ostial stenosis.  Patent sequential LIMA to D2 and LAD (touching initially on D2).  90% stenosis in the proximal D2 just distal to the LIMA touchdown.  The LAD beyond the LIMA touchdown has mild disease.    Left circumflex (LCx): Totally occluded proximal LCx.  Sequential SVG to ramus/OM1/OM2 was patent.  The native vessels were relatively small.   Right coronary artery (RCA): There was a 99% stenosis in the proximal RCA.  The RCA then gave rise to 3 small acute marginals.  The distal RCA was totally occluded.  Sequential SVG to PDA and PLV was patent.  There was a 60-70% stenosis in the mid PDA beyond the SVG touchdown.    Left ventriculography: Hand LV-gram.  EF 50%.    Final Conclusions:  Grafts are patent.  There are two areas that I see that could be causing ischemia.  One is the 99% stenosis in the proximal RCA.  The at-risk area here are 3 small acute marginals prior  the distal RCA occlusion.  I do not think intervention here is worthwhile.  The second area is D2 which has a 90% stenosis after LIMA touchdown.  PCI here would be feasible and this could be the culprit for his angina.  However, the vessel is small and borderline for stenting.    Recommendations: I think that initial medical management is appropriate here.  Cardiac enzymes were negative. HR is too low for beta blocker, will add Imdur 30 mg daily.  Would get full echo.    Marca Ancona 05/24/2011, 10:20 AM

## 2011-05-24 NOTE — Progress Notes (Signed)
After bedrest was complete, patient ambulated over 500 feet in hallway.  Tolerated activity well.  Angioseal dressing removed and bandaid placed on right groin site.  Site unremarkable.  Will continue to monitor. Andres Smith

## 2011-05-24 NOTE — Progress Notes (Signed)
ANTICOAGULATION CONSULT NOTE - Initial Consult  Pharmacy Consult for Heparin Indication: chest pain/ACS  No Known Allergies  Patient Measurements: Height: 5\' 9"  (175.3 cm) Weight: 208 lb 5.4 oz (94.5 kg) IBW/kg (Calculated) : 70.7  Heparin Dosing Weight: 85   Vital Signs: Temp: 97.8 F (36.6 C) (05/22 2200) Temp src: Oral (05/22 2200) BP: 123/52 mmHg (05/22 2200) Pulse Rate: 48  (05/22 2200)  Labs (at [facility]): SCr 0.97  WBC 6.2 Hgb 15.0 Hct 44.4 Plt 171  INR 1.0 PTT 26.4   No results found for this basename: HGB:2,HCT:3,PLT:3,APTT:3,LABPROT:3,INR:3,HEPARINUNFRC:3,CREATININE:3,CKTOTAL:3,CKMB:3,TROPONINI:3 in the last 72 hours  Estimated Creatinine Clearance: 85.4 ml/min (by C-G formula based on Cr of 0.9).   Medical History: Past Medical History  Diagnosis Date  . Hypertension   . Hyperlipidemia   . Diabetes mellitus   . Bradycardia   . Crohn's disease   . Neck pain   . Dysphagia   . Ankylosing spondylitis   H/O CABG 1997  Medications:  ASA  Lialda  Lipitor  Foltx  Metformin    Assessment: 71 yo male with chest pain for Heparin.  Lovenox 90 mg SQ given at [facility] at 4 pm.  Goal of Therapy:  Heparin level 0.3-0.7 units/ml Monitor platelets by anticoagulation protocol: Yes   Plan:  Start Heparin 1350 units/hr at 4 am Check heparin level in 8 hours.  Eddie Candle 05/24/2011,1:26 AM

## 2011-05-24 NOTE — Care Management Note (Unsigned)
    Page 1 of 1   05/24/2011     2:59:19 PM   CARE MANAGEMENT NOTE 05/24/2011  Patient:  Andres Smith, Andres Smith   Account Number:  [id number]  Date Initiated:  05/24/2011  Documentation initiated by:  GRAVES-BIGELOW,Deran Barro  Subjective/Objective Assessment:   Pt admitted with cp. S/p cath plan for medical management and echo.     Action/Plan:   CM will continue to f/u for additional disposition needs.   Anticipated DC Date:  05/25/2011   Anticipated DC Plan:  HOME/SELF CARE      DC Planning Services  CM consult      Choice offered to / List presented to:             Status of service:  In process, will continue to follow Medicare Important Message given?   (If response is "NO", the following Medicare IM given date fields will be blank) Date Medicare IM given:   Date Additional Medicare IM given:    Discharge Disposition:    Per UR Regulation:    If discussed at Long Length of Stay Meetings, dates discussed:    Comments:

## 2011-05-24 NOTE — Interval H&P Note (Signed)
History and Physical Interval Note:  05/24/2011 9:15 AM  Andres Smith  has presented today for surgery, with the diagnosis of cp  The various methods of treatment have been discussed with the patient and family. After consideration of risks, benefits and other options for treatment, the patient has consented to  Procedure(s) (LRB): LEFT HEART CATHETERIZATION WITH CORONARY/GRAFT ANGIOGRAM (N/A) as a surgical intervention .  [name] history has been reviewed, patient examined, no change in status, stable for surgery.  I have reviewed the patients' chart and labs.  Questions were answered to the patient's satisfaction.     Atara Paterson [employer]

## 2011-05-25 ENCOUNTER — Encounter (HOSPITAL_COMMUNITY): Payer: Self-pay

## 2011-05-25 ENCOUNTER — Telehealth: Payer: Self-pay | Admitting: Cardiology

## 2011-05-25 ENCOUNTER — Emergency Department (HOSPITAL_COMMUNITY)
Admission: EM | Admit: 2011-05-25 | Discharge: 2011-05-25 | Disposition: A | Discharge status: Home / Self Care | Payer: Medicare Other | Attending: Emergency Medicine | Admitting: Emergency Medicine

## 2011-05-25 DIAGNOSIS — Z79899 Other long term (current) drug therapy: Secondary | ICD-10-CM | POA: Diagnosis not present

## 2011-05-25 DIAGNOSIS — K5090 Crohn's disease, unspecified, without complications: Secondary | ICD-10-CM | POA: Insufficient documentation

## 2011-05-25 DIAGNOSIS — E785 Hyperlipidemia, unspecified: Secondary | ICD-10-CM | POA: Insufficient documentation

## 2011-05-25 DIAGNOSIS — E119 Type 2 diabetes mellitus without complications: Secondary | ICD-10-CM | POA: Insufficient documentation

## 2011-05-25 DIAGNOSIS — R079 Chest pain, unspecified: Secondary | ICD-10-CM | POA: Diagnosis not present

## 2011-05-25 DIAGNOSIS — K219 Gastro-esophageal reflux disease without esophagitis: Secondary | ICD-10-CM | POA: Insufficient documentation

## 2011-05-25 DIAGNOSIS — Z87891 Personal history of nicotine dependence: Secondary | ICD-10-CM | POA: Diagnosis not present

## 2011-05-25 DIAGNOSIS — I2510 Atherosclerotic heart disease of native coronary artery without angina pectoris: Secondary | ICD-10-CM | POA: Insufficient documentation

## 2011-05-25 DIAGNOSIS — Z7982 Long term (current) use of aspirin: Secondary | ICD-10-CM | POA: Insufficient documentation

## 2011-05-25 DIAGNOSIS — Z951 Presence of aortocoronary bypass graft: Secondary | ICD-10-CM | POA: Insufficient documentation

## 2011-05-25 DIAGNOSIS — I209 Angina pectoris, unspecified: Secondary | ICD-10-CM | POA: Insufficient documentation

## 2011-05-25 DIAGNOSIS — Z85828 Personal history of other malignant neoplasm of skin: Secondary | ICD-10-CM | POA: Diagnosis not present

## 2011-05-25 DIAGNOSIS — I10 Essential (primary) hypertension: Secondary | ICD-10-CM | POA: Diagnosis not present

## 2011-05-25 DIAGNOSIS — I200 Unstable angina: Secondary | ICD-10-CM

## 2011-05-25 LAB — CBC
Hemoglobin: 13.1 g/dL (ref 13.0–17.0)
MCH: 32.1 pg (ref 26.0–34.0)
Platelets: 128 10*3/uL — ABNORMAL LOW (ref 150–400)
RBC: 4.08 MIL/uL — ABNORMAL LOW (ref 4.22–5.81)

## 2011-05-25 LAB — BASIC METABOLIC PANEL
Chloride: 102 mEq/L (ref 96–112)
GFR calc Af Amer: >90 mL/min (ref >90)
GFR calc non Af Amer: 82 mL/min — ABNORMAL LOW (ref >90)
Glucose, Bld: 178 mg/dL — ABNORMAL HIGH (ref 70–99)
Potassium: 4.1 mEq/L (ref 3.5–5.1)
Sodium: 137 mEq/L (ref 135–145)

## 2011-05-25 LAB — DIFFERENTIAL
Basophils Relative: 0 % (ref 0–1)
Eosinophils Absolute: 0.1 10*3/uL (ref 0.0–0.7)
Lymphs Abs: 1.1 10*3/uL (ref 0.7–4.0)
Monocytes Relative: 9 % (ref 3–12)
Neutro Abs: 3.8 10*3/uL (ref 1.7–7.7)
Neutrophils Relative %: 69 % (ref 43–77)

## 2011-05-25 LAB — URINALYSIS, ROUTINE W REFLEX MICROSCOPIC
Nitrite: NEGATIVE
Specific Gravity, Urine: 1.022 (ref 1.005–1.030)
Urobilinogen, UA: 0.2 mg/dL (ref 0.0–1.0)
pH: 5.5 (ref 5.0–8.0)

## 2011-05-25 LAB — POCT I-STAT TROPONIN I: Troponin i, poc: 0.03 ng/mL (ref 0.00–0.08)

## 2011-05-25 NOTE — Telephone Encounter (Signed)
New problem:  Per after hour voice mail, patient suppose to see Dr.Wall in  2 weeks, patient stated that he spoke with Dr. Daleen Squibb on last night they decide on to wait. Patient said if he need to come in please let him know.

## 2011-05-25 NOTE — ED Notes (Signed)
Pt states that he left hospital last night AMA after having heart cath done yesterday. Pt states MD found a couple small blockages but that no stents were placed. Pt has hx of multiple bypasses. Pt states that he thinks he just overdid it this morning because he did not rest this am as MD told him. Pt denies n/v, diaphoresis, sob, dizziness. Pt denies any pain at this time.

## 2011-05-25 NOTE — ED Notes (Signed)
Pt notified of need to collect urine sample. Pt given urinal.

## 2011-05-25 NOTE — Telephone Encounter (Signed)
I talked with wife and reviewed new medication Imdur.  Also talked about follow-up appt in 2 weeks I have scheduled for pt to see Tereso Newcomer PA same day as Dr. Daleen Squibb is in office. Follow-up post cath on 05/24/11 Mylo Red RN

## 2011-05-25 NOTE — ED Notes (Addendum)
Per ems- pt was admitted to hospital for chest pain. Pt left ama after a heart cath yesterday. Pt states he began having chest pain this am, mid sternal, non radiating. Pt took 3 nitro and 324 asa and pain was relieved before ems arrived. Pt states "I think I just overdid it this morning." Pt was NSR on monitor with PVCs.

## 2011-05-25 NOTE — Discharge Instructions (Signed)
Angina Angina is chest discomfort caused by lack of oxygen to the heart muscle. It is a warning sign that there is a blood flow problem to your heart. Angina is referred to as either stable or unstable. Stable angina often happens with the same kind of activity, lasts a few minutes, and feels the same each time. Unstable angina has no pattern, no warning, lasts longer, and is more serious. Unstable angina might predict a heart attack. HOME CARE   Understand how to take your medicine and what side effects to expect.   Do not stop the medicines.   Do not change how much you take (dosage) on your own.   Write down any side effects. Tell your doctor what they are.   Mild exercise may help. Start exercising only as told by your doctor.   You can still have a sexual relationship if it does not cause angina. Tell your doctor if it does.   Stop smoking. Do not use gum or patches that help people quit smoking until you check with your doctor.   Lose weight if you are overweight. Eat a heart-healthy diet that is low in fat and salt.   Keep all follow-up visits with your doctor. This is important!  GET HELP RIGHT AWAY IF:   Your angina seems to happen more often or lasts longer.   You are having side effects from your medicine.   Your chest pain spreads to the arms, back, neck, or jaw (especially if the pain is crushing or pressure-like).   You are sweating, feel sick to your stomach (nauseous), or have shortness of breath.   You have an attack that does not get better after rest or taking medicine.   You wake from sleep with chest pain.   You feel dizzy, faint, or feel very tired (fatigued).   You have chest pain that is different from your usual angina.  Any of these problems may be a sign of a serious problem that is an emergency. Do not wait to see if the problems will go away. Get medical help right away. Call your local emergency services (911 in [place] Do not drive yourself to the  hospital. MAKE SURE YOU:   Understand these instructions.   Will watch your condition.   Will get help right away if you are not doing well or get worse.  Document Released: 06/06/2007 Document Revised: 12/07/2010 Document Reviewed: 06/06/2007 [facility] Patient Information 2012 [employer].   MR. Behl, YOUR PHYSICAL EXAMINATION AND LABORATORY TESTS AND EKG WERE GOOD TODAY.  YOU WERE SEEN IN CONSULTATION BY Tonny Bollman, M.D., FROM [facility], WHO ADVISED THAT YOU COULD BE RELEASED HOME AND THAT YOU SHOULD CONTINUE TO TAKE YOUR MEDICATIONS AS PREVIOUSLY PRESCRIBED.

## 2011-05-25 NOTE — Consult Note (Signed)
CARDIOLOGY CONSULT NOTE  Patient ID: Andres Smith MRN: [id number] DOB/AGE: 08/18/1940 71 y.o.  Admit date: 05/25/2011 Referring Physician: Dr Ignacia Palma Primary Physician Primary Cardiologist: Dr Daleen Squibb Reason for Consultation: Chest pain, known CAD  HPI: 71 year-old male with CAD s/p CABG presenting with chest pain this am. He just had cath yesterday showing severe diagonal stenosis just after the LIMA anastamosis. The diagonal is small in caliber and medical therapy was recommended. His LIMA to LAD, SVG to RCA, and SVG to OM are all patent. He was started on Imdur 30 mg daily.  This am, he did some work around the house and developed a dull pressure-like pain in the epigastrium. He took 3 NTG and the pain resolved after about 20 minutes. He called Dr Daleen Squibb while he was still having chest pain and was advised to call 911. Mild dyspnea unchanged, otherwise no associated symptoms. Denies lightheadedness, edema, orthopnea, PND, or palpitations.  Past Medical History  Diagnosis Date  . Hypertension   . Hyperlipidemia   . Diabetes mellitus   . Bradycardia   . Crohn's disease   . Neck pain   . Dysphagia   . Ankylosing spondylitis   . Coronary artery disease     a. s/p CABG x 6 in 1997 (LIMA->LAD, VG->RI ->OM1->OM2, VG->PDA->PLV;  b. 05/2011 Cath:  patent grafs, native prox rca and d2 dzs  ->med rx.  . Pneumonia     hx of PNA  . Blood transfusion     hx of transfusion without reaction  . Kidney stones   . GERD (gastroesophageal reflux disease)   . Cancer     hx of skin cancer     Past Surgical History  Procedure Date  . Coronary artery bypass graft 04/19/1995    x7  . Umbilical hernia repair   . Trigger finger release      Family History  Problem Relation Age of Onset  . Heart disease Father   . Heart disease Mother   . Heart disease    . Heart disease    . Heart disease    . Crohn's disease    . Crohn's disease    . Colon cancer Neg Hx     Social History: History    Social History  . Marital Status: Married    Spouse Name: N/A    Number of Children: 2  . Years of Education: N/A   Occupational History  . semi retired    Social History Main Topics  . Smoking status: Former Smoker    Quit date: 01/02/1983  . Smokeless tobacco: Current User  . Alcohol Use: Yes     daily  . Drug Use: No  . Sexually Active: Not Currently   Other Topics Concern  . Not on file   Social History Narrative  . No narrative on file    ROS: General: no fevers/chills/night sweats Eyes: no blurry vision, diplopia, or amaurosis ENT: no sore throat or hearing loss Resp: no cough, wheezing, or hemoptysis CV: no edema or palpitations GI: no abdominal pain, nausea, vomiting, diarrhea, or constipation GU: no dysuria, frequency, or hematuria Skin: no rash Neuro: no headache, numbness, tingling, or weakness of extremities Musculoskeletal: positive for neck and back pain Heme: no bleeding, DVT, or easy bruising Endo: no polydipsia or polyuria   Physical Exam: Blood pressure 132/58, pulse 51, temperature 98 F (36.7 C), temperature source Oral, resp. rate 20, SpO2 99.00%.  Pt is alert and oriented, WD, WN, pleasant  overweight male in no distress. HEENT: normal Neck: JVP normal. Carotid upstrokes normal without bruits. No thyromegaly. Lungs: equal expansion, clear bilaterally CV: Apex is discrete and nondisplaced, RRR without murmur or gallop Abd: soft, NT, +BS, no bruit, no hepatosplenomegaly Back: no CVA tenderness Ext: no C/C/E        Femoral pulses 2+=         DP/PT pulses intact and =        Right groin with mild tenderness but no hematoma Skin: warm and dry without rash Neuro: CNII-XII intact             Strength intact = bilaterally  Labs:   Lab Results  Component Value Date   WBC 5.5 05/25/2011   HGB 13.1 05/25/2011   HCT 38.4* 05/25/2011   MCV 94.1 05/25/2011   PLT 128* 05/25/2011    Lab 05/25/11 1210  NA 137  K 4.1  CL 102  CO2 27  BUN 14   CREATININE 0.94  CALCIUM 8.8  PROT --  BILITOT --  ALKPHOS --  ALT --  AST --  GLUCOSE 178*   Lab Results  Component Value Date   CKTOTAL 145 05/24/2011   CKMB 1.7 05/24/2011   TROPONINI HEMOLYSIS AT THIS LEVEL MAY AFFECT RESULT 05/24/2011    Lab Results  Component Value Date   CHOL 158 05/16/2011   CHOL 148 05/19/2010   CHOL 159 06/07/2009   Lab Results  Component Value Date   HDL 63.80 05/16/2011   HDL 09.81 05/19/2010   HDL 19.14 06/07/2009   Lab Results  Component Value Date   LDLCALC 67 05/16/2011   LDLCALC 60 05/19/2010   LDLCALC 72 06/07/2009   Lab Results  Component Value Date   TRIG 134.0 05/16/2011   TRIG 38.0 05/19/2010   TRIG 74.0 06/07/2009   Lab Results  Component Value Date   CHOLHDL 2 05/16/2011   CHOLHDL 2 05/19/2010   CHOLHDL 2 06/07/2009   No results found for this basename: LDLDIRECT      Radiology: No results found.  EKG: Sinus brady, HR 51 bpm, age-indeterminate inferior infarct, no acute changes  ASSESSMENT AND PLAN:  1. Class 3 angina with known CAD and recent cath 2. Type 2 DM 3. HTN, controlled  Difficult situation. I have carefully reviewed the patient's cath films. I suspect the diagonal stenosis is causing his anginal symptoms, but this vessel is small, arises from just beyond the LIMA insertion, and I'm not confident it's amenable to stenting. I suspect long-term patency rates will be low. If I were to approach the lesion percutaneously, I would approach through the native circulation rather than the LIMA which is very tortuous. Considering overall risk-benefit, favor medical treatment with escalation of anti-anginal Rx. Will increase Imdur to 60 mg daily. Pt will follow-up with Dr Daleen Squibb. If he has progressive angina or suboptimal response to medical Rx, PCI would be reasonable. Long discussion with the patient and family who understand and are agreeable with the plan.  Tonny Bollman 05/25/2011, 1:36 PM

## 2011-05-25 NOTE — ED Notes (Signed)
Pt d/c home in NAD. Pt voiced understanding of d/c instructions and follow up care. Pt ambulated with quick, steady gait and denies any chest pain at this time.

## 2011-05-25 NOTE — ED Provider Notes (Addendum)
History     CSN: [id number]  Arrival date & time 05/25/11  1118   None     Chief Complaint  Patient presents with  . Chest Pain    (Consider location/radiation/quality/duration/timing/severity/associated sxs/prior treatment) HPI Comments: The patient is a 71 year old man with a history of coronary artery disease, having had coronary artery bypass graft about 16 years ago. 2 days ago he developed chest pain while working in [facility]. He took nitroglycerin without relief. His cardiologist, Valera Castle M.D., was called from [facility], patient was advised to come to [facility] for his condition. He was admitted and had cardiac catheterization yesterday, which apparently showed minimal areas of coronary disease, and patent grafts. He was released yesterday evening. This morning he had more chest pain. He took nitroglycerin 3 times without relief. 911 was called, and by the time they got there he was asymptomatic. He was transported to [facility] anyway, and is waiting to see [facility]  cardiology.  Patient is a 71 y.o. male presenting with chest pain. The history is provided by the patient and medical records. No language interpreter was used.  Chest Pain The chest pain began 1 - 2 hours ago. Duration of episode(s) is 30 minutes. Episode frequency: One episode this morning. The chest pain is resolved. Associated with: Nothing.     Past Medical History  Diagnosis Date  . Hypertension   . Hyperlipidemia   . Diabetes mellitus   . Bradycardia   . Crohn's disease   . Neck pain   . Dysphagia   . Ankylosing spondylitis   . Coronary artery disease     a. s/p CABG x 6 in 1997 (LIMA->LAD, VG->RI ->OM1->OM2, VG->PDA->PLV;  b. 05/2011 Cath:  patent grafs, native prox rca and d2 dzs  ->med rx.  . Pneumonia     hx of PNA  . Blood transfusion     hx of transfusion without reaction  . Kidney stones   . GERD (gastroesophageal reflux disease)   . Cancer     hx of skin cancer     Past Surgical History  Procedure Date  . Coronary artery bypass graft 04/19/1995    x7  . Umbilical hernia repair   . Trigger finger release     Family History  Problem Relation Age of Onset  . Heart disease Father   . Heart disease Mother   . Heart disease    . Heart disease    . Heart disease    . Crohn's disease    . Crohn's disease    . Colon cancer Neg Hx     History  Substance Use Topics  . Smoking status: Former Smoker    Quit date: 01/02/1983  . Smokeless tobacco: Current User  . Alcohol Use: Yes     daily      Review of Systems  Cardiovascular: Positive for chest pain.    Allergies  Review of patient's allergies indicates no known allergies.  Home Medications   Current Outpatient Rx  Name Route Sig Dispense Refill  . ASPIRIN 81 MG PO TABS Oral Take 81 mg by mouth daily.      . ATORVASTATIN CALCIUM 80 MG PO TABS Oral Take 80 mg by mouth daily.    Marland Kitchen CARVEDILOL 6.25 MG PO TABS Oral Take 6.25 mg by mouth 2 (two) times daily with a meal.    . FA-PYRIDOXINE-CYANCOBALAMIN 2.5-25-2 MG PO TABS Oral Take 1 tablet by mouth daily.    Marland Kitchen  ISOSORBIDE MONONITRATE ER 30 MG PO TB24 Oral Take 1 tablet (30 mg total) by mouth daily. 30 tablet 6  . LOSARTAN POTASSIUM 100 MG PO TABS Oral Take 100 mg by mouth daily.    Marland Kitchen MESALAMINE 1.2 G PO TBEC Oral Take 1,200 mg by mouth 2 (two) times daily.     Marland Kitchen METFORMIN HCL ER 500 MG PO TB24 Oral Take 1 tablet (500 mg total) by mouth daily with breakfast.      ** RESUME ON 05/27/2011 **  . NITROGLYCERIN 0.4 MG SL SUBL Sublingual Place 0.4 mg under the tongue every 5 (five) minutes as needed. For chest pain      BP 132/69  Pulse 54  Temp(Src) 98 F (36.7 C) (Oral)  Resp 18  SpO2 99%  Physical Exam  Nursing note and vitals reviewed. Constitutional: He is oriented to person, place, and time. He appears well-developed and well-nourished. No distress.  HENT:  Head: Normocephalic and atraumatic.  Right Ear: External ear normal.   Left Ear: External ear normal.  Mouth/Throat: Oropharynx is clear and moist.  Eyes: Conjunctivae and EOM are normal. Pupils are equal, round, and reactive to light.  Neck: Normal range of motion. Neck supple.  Cardiovascular: Normal rate, regular rhythm and normal heart sounds.   Pulmonary/Chest: Effort normal and breath sounds normal.  Abdominal: Soft.  Musculoskeletal: Normal range of motion. He exhibits no edema and no tenderness.  Neurological: He is alert and oriented to person, place, and time.       No sensory or motor deficit.  Skin: Skin is warm and dry.  Psychiatric: He has a normal mood and affect. His behavior is normal.    ED Course  Procedures (including critical care time)  11:31 AM  Date: 05/25/2011  Rate:51  Rhythm: sinus bradycardia  QRS Axis: normal  Intervals: PR prolonged QRS:  Q waves in inferior leads suggest old inferior myocardial infarction.  ST/T Wave abnormalities: normal  Conduction Disutrbances:first-degree A-V block   Narrative Interpretation: Abnormal EKG.  Old EKG Reviewed: unchanged  1:31 PM Results for orders placed during the hospital encounter of 05/25/11  CBC      Component Value Range   WBC 5.5  4.0 - 10.5 (K/uL)   RBC 4.08 (*) 4.22 - 5.81 (MIL/uL)   Hemoglobin 13.1  13.0 - 17.0 (g/dL)   HCT 16.1 (*) 09.6 - 52.0 (%)   MCV 94.1  78.0 - 100.0 (fL)   MCH 32.1  26.0 - 34.0 (pg)   MCHC 34.1  30.0 - 36.0 (g/dL)   RDW 04.5  40.9 - 81.1 (%)   Platelets 128 (*) 150 - 400 (K/uL)  DIFFERENTIAL      Component Value Range   Neutrophils Relative 69  43 - 77 (%)   Neutro Abs 3.8  1.7 - 7.7 (K/uL)   Lymphocytes Relative 19  12 - 46 (%)   Lymphs Abs 1.1  0.7 - 4.0 (K/uL)   Monocytes Relative 9  3 - 12 (%)   Monocytes Absolute 0.5  0.1 - 1.0 (K/uL)   Eosinophils Relative 3  0 - 5 (%)   Eosinophils Absolute 0.1  0.0 - 0.7 (K/uL)   Basophils Relative 0  0 - 1 (%)   Basophils Absolute 0.0  0.0 - 0.1 (K/uL)  BASIC METABOLIC PANEL       Component Value Range   Sodium 137  135 - 145 (mEq/L)   Potassium 4.1  3.5 - 5.1 (mEq/L)   Chloride 102  96 - 112 (mEq/L)   CO2 27  19 - 32 (mEq/L)   Glucose, Bld 178 (*) 70 - 99 (mg/dL)   BUN 14  6 - 23 (mg/dL)   Creatinine, Ser 9.60  0.50 - 1.35 (mg/dL)   Calcium 8.8  8.4 - 45.4 (mg/dL)   GFR calc non Af Amer 82 (*) >90 (mL/min)   GFR calc Af Amer >90  >90 (mL/min)  URINALYSIS, ROUTINE W REFLEX MICROSCOPIC      Component Value Range   Color, Urine YELLOW  YELLOW    APPearance CLEAR  CLEAR    Specific Gravity, Urine 1.022  1.005 - 1.030    pH 5.5  5.0 - 8.0    Glucose, UA 100 (*) NEGATIVE (mg/dL)   Hgb urine dipstick NEGATIVE  NEGATIVE    Bilirubin Urine NEGATIVE  NEGATIVE    Ketones, ur NEGATIVE  NEGATIVE (mg/dL)   Protein, ur NEGATIVE  NEGATIVE (mg/dL)   Urobilinogen, UA 0.2  0.0 - 1.0 (mg/dL)   Nitrite NEGATIVE  NEGATIVE    Leukocytes, UA NEGATIVE  NEGATIVE   POCT I-STAT TROPONIN I      Component Value Range   Troponin i, poc 0.03  0.00 - 0.08 (ng/mL)   Comment 3            1:31 PM Pt's lab tests were normal. Pt was seen by Tonny Bollman, M.D. Of [facility], who advised that pt could safely go home and continue his regular medications.    1. Angina pectoris          Carleene Cooper III, MD 05/25/11 1336     Carleene Cooper III, MD 07/04/11 [phone]

## 2011-05-31 ENCOUNTER — Other Ambulatory Visit: Payer: Self-pay | Admitting: *Deleted

## 2011-05-31 ENCOUNTER — Other Ambulatory Visit: Payer: Self-pay | Admitting: Cardiology

## 2011-05-31 MED ORDER — FA-PYRIDOXINE-CYANOCOBALAMIN 2.5-25-2 MG PO TABS: 1.0000 | ORAL_TABLET | Freq: Every day | ORAL | Status: DC

## 2011-06-08 ENCOUNTER — Ambulatory Visit: Payer: Medicare Other | Admitting: Physician Assistant

## 2011-06-21 ENCOUNTER — Other Ambulatory Visit: Payer: Self-pay | Admitting: Gastroenterology

## 2011-06-26 ENCOUNTER — Encounter: Payer: Self-pay | Admitting: Gastroenterology

## 2011-06-26 ENCOUNTER — Ambulatory Visit (INDEPENDENT_AMBULATORY_CARE_PROVIDER_SITE_OTHER): Payer: Medicare Other | Admitting: Gastroenterology

## 2011-06-26 VITALS — BP 100/60 | HR 60 | Ht 68.0 in | Wt 209.8 lb

## 2011-06-26 DIAGNOSIS — I2510 Atherosclerotic heart disease of native coronary artery without angina pectoris: Secondary | ICD-10-CM | POA: Diagnosis not present

## 2011-06-26 DIAGNOSIS — E669 Obesity, unspecified: Secondary | ICD-10-CM

## 2011-06-26 DIAGNOSIS — Z9089 Acquired absence of other organs: Secondary | ICD-10-CM | POA: Diagnosis not present

## 2011-06-26 DIAGNOSIS — K7689 Other specified diseases of liver: Secondary | ICD-10-CM

## 2011-06-26 DIAGNOSIS — Z9049 Acquired absence of other specified parts of digestive tract: Secondary | ICD-10-CM

## 2011-06-26 DIAGNOSIS — K5190 Ulcerative colitis, unspecified, without complications: Secondary | ICD-10-CM

## 2011-06-26 DIAGNOSIS — K760 Fatty (change of) liver, not elsewhere classified: Secondary | ICD-10-CM

## 2011-06-26 MED ORDER — MESALAMINE 1.2 G PO TBEC: DELAYED_RELEASE_TABLET | ORAL | Status: DC

## 2011-06-26 NOTE — Addendum Note (Signed)
Addended by: Richardson Chiquito on: 06/26/2011 10:43 AM   Modules accepted: Orders

## 2011-06-26 NOTE — Patient Instructions (Addendum)
You have been given a separate informational sheet regarding your tobacco use, the importance of quitting and local resources to help you quit. We have sent the following medications to your pharmacy for you to pick up at your convenience: Lialda CC: Dr Valera Castle

## 2011-06-26 NOTE — Progress Notes (Signed)
This is a 71 year-old Caucasian male with chronic inflammatory bowel disease related to his ankylosing spondylitis. For years he was on 6-MP therapy this was discontinued in September. He is chronically on Lialda 2.4 g a day, and is currently asymptomatic in terms of any gastrointestinal issues. He has daily bowel movements without pain, melena or hematochezia. He also denies upper gastrointestinal or hepatobiliary complaints. Recently has had some angina requiring repeat angiography, and institution of long-acting nitrates. He is followed regular diet but is on metformin XR 500 mg a day. He continues with excessive alcohol use, has daily drinking, and has refused outpatient or inpatient counseling.  Current Medications, Allergies, Past Medical History, Past Surgical History, Family History and Social History were reviewed in [employer] record.  Pertinent Review of Systems Negative   Physical Exam: Blood pressure 100/60, pulse 60 and regular, weight 209 with a BMI of 31.90. I cannot appreciate stigmata of chronic liver disease. His chest is clear, and he appears to be irregular rhythm without significant murmurs gallops or rubs. He has a very protuberant abdomen, and has no umbilicus. I cannot appreciate organomegaly, masses, tenderness, or ascites. Bowel sounds are normal. Mental status is normal. 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 He is up-to-date on his colonoscopy exams and lab tests. We will continue Lialda at current doses with every 6 month followup. His primary care is primarily through Dr. Valera Castle  in cardiology. I again have counseled Nathanyel about his alcohol dependency and hopefully continued control of this problem with the help of his physicians and family. No diagnosis found.

## 2011-06-26 NOTE — Addendum Note (Signed)
Addended by: Richardson Chiquito on: 06/26/2011 03:56 PM   Modules accepted: Orders

## 2011-07-02 ENCOUNTER — Ambulatory Visit: Payer: Medicare Other

## 2011-07-02 DIAGNOSIS — K7689 Other specified diseases of liver: Secondary | ICD-10-CM

## 2011-07-02 LAB — HEPATIC FUNCTION PANEL
Albumin: 3.9 g/dL (ref 3.5–5.2)
Total Bilirubin: 0.9 mg/dL (ref 0.3–1.2)

## 2011-07-10 ENCOUNTER — Other Ambulatory Visit: Payer: Self-pay | Admitting: Cardiology

## 2011-07-12 ENCOUNTER — Other Ambulatory Visit: Payer: Self-pay | Admitting: Cardiology

## 2011-07-30 ENCOUNTER — Other Ambulatory Visit: Payer: Self-pay | Admitting: Cardiology

## 2011-09-26 ENCOUNTER — Encounter: Payer: Self-pay | Admitting: Cardiology

## 2011-09-27 ENCOUNTER — Ambulatory Visit (INDEPENDENT_AMBULATORY_CARE_PROVIDER_SITE_OTHER): Payer: Medicare Other | Admitting: Cardiology

## 2011-09-27 ENCOUNTER — Encounter: Payer: Self-pay | Admitting: Cardiology

## 2011-09-27 ENCOUNTER — Other Ambulatory Visit: Payer: Self-pay | Admitting: Cardiology

## 2011-09-27 VITALS — BP 143/65 | HR 49 | Ht 68.0 in | Wt 208.8 lb

## 2011-09-27 DIAGNOSIS — E119 Type 2 diabetes mellitus without complications: Secondary | ICD-10-CM

## 2011-09-27 DIAGNOSIS — E785 Hyperlipidemia, unspecified: Secondary | ICD-10-CM | POA: Diagnosis not present

## 2011-09-27 DIAGNOSIS — I10 Essential (primary) hypertension: Secondary | ICD-10-CM

## 2011-09-27 DIAGNOSIS — I252 Old myocardial infarction: Secondary | ICD-10-CM | POA: Diagnosis not present

## 2011-09-27 DIAGNOSIS — I6529 Occlusion and stenosis of unspecified carotid artery: Secondary | ICD-10-CM

## 2011-09-27 DIAGNOSIS — I2510 Atherosclerotic heart disease of native coronary artery without angina pectoris: Secondary | ICD-10-CM | POA: Diagnosis not present

## 2011-09-27 DIAGNOSIS — Z951 Presence of aortocoronary bypass graft: Secondary | ICD-10-CM

## 2011-09-27 MED ORDER — LOSARTAN POTASSIUM 100 MG PO TABS: 100.0000 mg | ORAL_TABLET | Freq: Every day | ORAL | Status: DC

## 2011-09-27 MED ORDER — CARVEDILOL 6.25 MG PO TABS: 6.2500 mg | ORAL_TABLET | Freq: Two times a day (BID) | ORAL | Status: DC

## 2011-09-27 MED ORDER — FOLIC ACID 1 MG PO TABS: 2.5000 mg | ORAL_TABLET | Freq: Every day | ORAL | Status: DC

## 2011-09-27 MED ORDER — METFORMIN HCL ER 500 MG PO TB24: 500.0000 mg | ORAL_TABLET | Freq: Every day | ORAL | Status: DC

## 2011-09-27 MED ORDER — ISOSORBIDE MONONITRATE ER 30 MG PO TB24: 30.0000 mg | ORAL_TABLET | Freq: Every day | ORAL | Status: DC

## 2011-09-27 MED ORDER — ATORVASTATIN CALCIUM 80 MG PO TABS: 80.0000 mg | ORAL_TABLET | Freq: Every day | ORAL | Status: DC

## 2011-09-27 NOTE — Progress Notes (Signed)
HPI Mr Nishiyama returns today for evaluation and management of his coronary artery disease, history bypass surgery, nonobstructive carotid disease, hypertension, hyperlipidemia, and type 2 diabetes.  He is having no angina or TIAs. He continues to exercise but not as faithful as he used to be.  Blood work was done in May. Lipids were at goal. His hemoglobin A1c was 7%. Carotid Dopplers were done in November of last year and showed nonobstructive disease. Repeat in 2 years. Difficult to visualize because of his ankylosing spondylitis.  Past Medical History  Diagnosis Date  . Hypertension   . Hyperlipidemia   . Diabetes mellitus   . Bradycardia   . Crohn's disease   . Neck pain   . Dysphagia   . Ankylosing spondylitis   . Coronary artery disease     a. s/p CABG x 6 in 1997 (LIMA->LAD, VG->RI ->OM1->OM2, VG->PDA->PLV;  b. 05/2011 Cath:  patent grafs, native prox rca and d2 dzs  ->med rx.  . Pneumonia     hx of PNA  . Blood transfusion     hx of transfusion without reaction  . Kidney stones   . GERD (gastroesophageal reflux disease)   . History of skin cancer     Current Outpatient Prescriptions  Medication Sig Dispense Refill  . aspirin 81 MG tablet Take 81 mg by mouth daily.        Marland Kitchen atorvastatin (LIPITOR) 80 MG tablet Take 80 mg by mouth daily.      . carvedilol (COREG) 6.25 MG tablet TAKE 1 TABLET TWICE DAILY.  60 tablet  12  . folic acid-pyridoxine-cyancobalamin (FOLTX) 2.5-25-2 MG TABS Take 1 tablet by mouth daily.  30 each  3  . GLUCOPHAGE XR 500 MG 24 hr tablet TAKE 1 TABLET ONCE DAILY.  30 each  9  . isosorbide mononitrate (IMDUR) 30 MG 24 hr tablet Take 30 mg by mouth daily.      Marland Kitchen losartan (COZAAR) 100 MG tablet Take 100 mg by mouth daily.      . mesalamine (LIALDA) 1.2 G EC tablet Take 2 tablets by mouth once daily  60 tablet  3  . nitroGLYCERIN (NITROSTAT) 0.4 MG SL tablet Place 0.4 mg under the tongue every 5 (five) minutes as needed. For chest pain      . DISCONTD:  escitalopram (LEXAPRO) 10 MG tablet Take 10 mg by mouth daily.       Marland Kitchen DISCONTD: esomeprazole (NEXIUM) 40 MG capsule Take 40 mg by mouth as needed.          No Known Allergies  Family History  Problem Relation Age of Onset  . Heart disease Father   . Heart disease Mother   . Heart disease    . Heart disease    . Heart disease    . Crohn's disease    . Crohn's disease    . Colon cancer Neg Hx     History   Social History  . Marital Status: Married    Spouse Name: N/A    Number of Children: 2  . Years of Education: N/A   Occupational History  . semi retired    Social History Main Topics  . Smoking status: Former Smoker    Quit date: 01/02/1983  . Smokeless tobacco: Current User  . Alcohol Use: Yes     daily  . Drug Use: No  . Sexually Active: Not Currently   Other Topics Concern  . Not on file   Social History Narrative  .  No narrative on file    ROS ALL NEGATIVE EXCEPT THOSE NOTED IN HPI  PE  General Appearance: well developed, well nourished in no acute distress, overweight HEENT: symmetrical face, PERRLA, good dentition  Neck: no JVD, thyromegaly, or adenopathy, trachea midline Chest: symmetric without deformity Cardiac: PMI non-displaced, RRR, normal S1, S2, no gallop or murmur Lung: clear to ausculation and percussion Vascular: all pulses full without bruits  Abdominal: nondistended, nontender, good bowel sounds, no HSM, no bruits Extremities: no cyanosis, clubbing or edema, no sign of DVT, no varicosities  Skin: normal color, no rashes Neuro: alert and oriented x 3, non-focal Pysch: normal affect EKG Not repeated  BMET    Component Value Date/Time   NA 137 05/25/2011 1210   K 4.1 05/25/2011 1210   CL 102 05/25/2011 1210   CO2 27 05/25/2011 1210   GLUCOSE 178* 05/25/2011 1210   GLUCOSE 98 12/12/2005 0729   BUN 14 05/25/2011 1210   CREATININE 0.94 05/25/2011 1210   CALCIUM 8.8 05/25/2011 1210   GFRNONAA 82* 05/25/2011 1210   GFRAA >90 05/25/2011  1210    Lipid Panel     Component Value Date/Time   CHOL 158 05/16/2011 0749   TRIG 134.0 05/16/2011 0749   HDL 63.80 05/16/2011 0749   CHOLHDL 2 05/16/2011 0749   VLDL 26.8 05/16/2011 0749   LDLCALC 67 05/16/2011 0749    CBC    Component Value Date/Time   WBC 5.5 05/25/2011 1210   RBC 4.08* 05/25/2011 1210   HGB 13.1 05/25/2011 1210   HCT 38.4* 05/25/2011 1210   PLT 128* 05/25/2011 1210   MCV 94.1 05/25/2011 1210   MCH 32.1 05/25/2011 1210   MCHC 34.1 05/25/2011 1210   RDW 13.2 05/25/2011 1210   LYMPHSABS 1.1 05/25/2011 1210   MONOABS 0.5 05/25/2011 1210   EOSABS 0.1 05/25/2011 1210   BASOSABS 0.0 05/25/2011 1210

## 2011-09-27 NOTE — Patient Instructions (Addendum)
Your physician recommends that you continue on your current medications as directed. Please refer to the Current Medication list given to you today.  Your physician wants you to follow-up in:  May 2014 with Dr. Daleen Squibb. You will receive a reminder letter in the mail two months in advance. If you don't receive a letter, please call our office to schedule the follow-up appointment.

## 2011-09-27 NOTE — Assessment & Plan Note (Signed)
Repeat carotid Dopplers in November of 2014.

## 2011-09-27 NOTE — Assessment & Plan Note (Signed)
Continue atorvastatin, exercise and weight control. Repeat blood work in the spring of 2014.

## 2011-09-27 NOTE — Assessment & Plan Note (Signed)
Under good control. No change in medical therapy. 

## 2011-09-27 NOTE — Assessment & Plan Note (Signed)
Stable. Continue secondary preventative therapy. We'll change to folic acid 2.5 mg per Dr. Jarold Motto and eliminate Foltx. There's no cardiovascular benefit to the latter drug.

## 2011-09-27 NOTE — Telephone Encounter (Signed)
New problem:  Mail order : [facility] [phone] phone number / fax # [phone]

## 2011-09-30 ENCOUNTER — Other Ambulatory Visit: Payer: Self-pay | Admitting: Cardiology

## 2011-10-16 ENCOUNTER — Other Ambulatory Visit: Payer: Self-pay | Admitting: Dermatology

## 2011-10-16 DIAGNOSIS — L570 Actinic keratosis: Secondary | ICD-10-CM | POA: Diagnosis not present

## 2011-10-16 DIAGNOSIS — L821 Other seborrheic keratosis: Secondary | ICD-10-CM | POA: Diagnosis not present

## 2011-10-16 DIAGNOSIS — D485 Neoplasm of uncertain behavior of skin: Secondary | ICD-10-CM | POA: Diagnosis not present

## 2011-10-16 DIAGNOSIS — Z85828 Personal history of other malignant neoplasm of skin: Secondary | ICD-10-CM | POA: Diagnosis not present

## 2011-10-26 ENCOUNTER — Other Ambulatory Visit: Payer: Self-pay | Admitting: Gastroenterology

## 2011-10-26 MED ORDER — MESALAMINE 1.2 G PO TBEC: DELAYED_RELEASE_TABLET | ORAL | Status: DC

## 2011-10-26 NOTE — Telephone Encounter (Signed)
rx sent

## 2011-10-30 DIAGNOSIS — Z23 Encounter for immunization: Secondary | ICD-10-CM | POA: Diagnosis not present

## 2011-11-02 ENCOUNTER — Telehealth: Payer: Self-pay | Admitting: *Deleted

## 2011-11-02 ENCOUNTER — Other Ambulatory Visit: Payer: Self-pay

## 2011-11-02 MED ORDER — MESALAMINE 1.2 G PO TBEC: DELAYED_RELEASE_TABLET | ORAL | Status: DC

## 2011-11-02 MED ORDER — MERCAPTOPURINE 50 MG PO TABS: 50.0000 mg | ORAL_TABLET | Freq: Every day | ORAL | Status: DC

## 2011-11-02 NOTE — Telephone Encounter (Signed)
Pt in to pick up Lialda samples and requested Lialda and be sent to [facility]. He also stated Dr Jarold Motto wants to see him next Tuesday. Re odered scripts to [facility], cancelled and called [facility] about Bunch and scheduled an appt on 11/06/11 to see Dr Jarold Motto; Ok to double book per dr Jarold Motto. Pt notified.

## 2011-11-06 ENCOUNTER — Encounter: Payer: Self-pay | Admitting: Gastroenterology

## 2011-11-06 ENCOUNTER — Ambulatory Visit (INDEPENDENT_AMBULATORY_CARE_PROVIDER_SITE_OTHER): Payer: Medicare Other | Admitting: Gastroenterology

## 2011-11-06 ENCOUNTER — Other Ambulatory Visit (INDEPENDENT_AMBULATORY_CARE_PROVIDER_SITE_OTHER): Payer: Medicare Other

## 2011-11-06 VITALS — BP 110/56 | HR 76 | Ht 67.5 in | Wt 212.0 lb

## 2011-11-06 DIAGNOSIS — K5190 Ulcerative colitis, unspecified, without complications: Secondary | ICD-10-CM

## 2011-11-06 DIAGNOSIS — I2510 Atherosclerotic heart disease of native coronary artery without angina pectoris: Secondary | ICD-10-CM

## 2011-11-06 DIAGNOSIS — F1010 Alcohol abuse, uncomplicated: Secondary | ICD-10-CM

## 2011-11-06 DIAGNOSIS — L089 Local infection of the skin and subcutaneous tissue, unspecified: Secondary | ICD-10-CM

## 2011-11-06 DIAGNOSIS — E119 Type 2 diabetes mellitus without complications: Secondary | ICD-10-CM

## 2011-11-06 DIAGNOSIS — M459 Ankylosing spondylitis of unspecified sites in spine: Secondary | ICD-10-CM

## 2011-11-06 LAB — CBC WITH DIFFERENTIAL/PLATELET
Basophils Absolute: 0 10*3/uL (ref 0.0–0.1)
Basophils Relative: 0.7 % (ref 0.0–3.0)
Eosinophils Absolute: 0.2 10*3/uL (ref 0.0–0.7)
Eosinophils Relative: 3.2 % (ref 0.0–5.0)
HCT: 42.5 % (ref 39.0–52.0)
Hemoglobin: 14.1 g/dL (ref 13.0–17.0)
Lymphocytes Relative: 25.3 % (ref 12.0–46.0)
Lymphs Abs: 1.4 10*3/uL (ref 0.7–4.0)
MCHC: 33.3 g/dL (ref 30.0–36.0)
MCV: 97.7 fl (ref 78.0–100.0)
Monocytes Absolute: 0.5 10*3/uL (ref 0.1–1.0)
Monocytes Relative: 10 % (ref 3.0–12.0)
Neutro Abs: 3.3 10*3/uL (ref 1.4–7.7)
Neutrophils Relative %: 60.8 % (ref 43.0–77.0)
Platelets: 163 10*3/uL (ref 150.0–400.0)
RBC: 4.35 Mil/uL (ref 4.22–5.81)
RDW: 13.7 % (ref 11.5–14.6)
WBC: 5.4 10*3/uL (ref 4.5–10.5)

## 2011-11-06 LAB — SEDIMENTATION RATE: Sed Rate: 15 mm/hr (ref 0–22)

## 2011-11-06 LAB — C-REACTIVE PROTEIN: CRP: 0.5 mg/dL (ref 0.5–20.0)

## 2011-11-06 MED ORDER — PREDNISONE 10 MG PO TABS: ORAL_TABLET | ORAL | Status: DC

## 2011-11-06 MED ORDER — CEFUROXIME AXETIL 250 MG PO TABS: 250.0000 mg | ORAL_TABLET | Freq: Two times a day (BID) | ORAL | Status: DC

## 2011-11-06 NOTE — Patient Instructions (Addendum)
Your physician has requested that you go to the basement for the following lab work before leaving today: CBC, Sed Rate, CRP We have sent the following medications to your pharmacy for you to pick up at your convenience: Prednisone 20 mg daily  CC: Dr Valera Castle
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
## 2011-11-13 ENCOUNTER — Ambulatory Visit: Payer: Medicare Other | Admitting: Gastroenterology

## 2011-11-14 ENCOUNTER — Encounter: Payer: Self-pay | Admitting: *Deleted

## 2011-11-19 ENCOUNTER — Telehealth: Payer: Self-pay | Admitting: *Deleted

## 2011-11-19 ENCOUNTER — Telehealth: Payer: Self-pay | Admitting: Gastroenterology

## 2011-11-19 MED ORDER — MESALAMINE 1.2 G PO TBEC: DELAYED_RELEASE_TABLET | ORAL | Status: DC

## 2011-11-19 NOTE — Telephone Encounter (Signed)
I called [facility] at [phone] and spoke with Pennie Rushing. Per Frutoso Schatz shipped on Nov 6. Pennie Rushing stated that postal service is slow at this time and to advise patient that if he does not get his prescription by Friday to call them or [place] back.  I tried to reach patient to give him this information at home and mobile number, no success, left a message.

## 2011-11-19 NOTE — Telephone Encounter (Signed)
Pt questioned his Lialda script and I called [facility] and was told the script will cost about $550 for 3 months; they tried to call the pt, got no response and cancelled the order. Pt doesn't know the monthly cost; he will ask his wife and I will leave him samples for tomorrow and he will let me know then. To schedule delivery, pt needs to call [phone]

## 2011-11-20 ENCOUNTER — Ambulatory Visit (INDEPENDENT_AMBULATORY_CARE_PROVIDER_SITE_OTHER): Payer: Medicare Other | Admitting: Gastroenterology

## 2011-11-20 ENCOUNTER — Encounter: Payer: Self-pay | Admitting: Gastroenterology

## 2011-11-20 VITALS — BP 110/50 | HR 66 | Ht 67.5 in | Wt 209.4 lb

## 2011-11-20 DIAGNOSIS — M459 Ankylosing spondylitis of unspecified sites in spine: Secondary | ICD-10-CM | POA: Diagnosis not present

## 2011-11-20 DIAGNOSIS — K5190 Ulcerative colitis, unspecified, without complications: Secondary | ICD-10-CM | POA: Diagnosis not present

## 2011-11-20 DIAGNOSIS — E119 Type 2 diabetes mellitus without complications: Secondary | ICD-10-CM

## 2011-11-20 NOTE — Patient Instructions (Addendum)
Please make follow up appointment with Dr. Jarold Motto in three months   Please come back in one month on December 20, 2011 to do lab work. Lab is located basement level of this building open to 7:30 am - 5:30 pm

## 2011-11-20 NOTE — Progress Notes (Signed)
History of Present Illness: This is a 71 year old Caucasian male with chronic inflammatory bowel disease associated with ankylosing spondylitis.  He recently has had restored his 6-MP 50 mg a day because of a relapse of his bloody diarrhea off this medication over a period of several months.  His back on 6-MP and takes Lialda 2.4 g a day and is currently asymptomatic.  Review of his laboratory data shows a white count of 5400 but no evidence of severe leukopenia.  He has type 2 diabetes and has had numerous cardiovascular problems and angioplasties, is followed by Dr. Maisie Fus wall That in cardiology.  He denies a current cardiovascular, pulmonary, or general medical or other gastrointestinal problems.  He is trying hard to modify his alcohol abuse as per my previous notes.  Liver function tests within normal limits and no history of chronic liver disease.    Current Medications, Allergies, Past Medical History, Past Surgical History, Family History and Social History were reviewed in [employer] record.   Assessment and plan: Daily 6-MP 50 mg a day with Lialda 2.4 g a day with repeat CBC in one month.  I'll see him in 3 months time for his usual checkup.  Patient also is on daily folic acid, Lipitor, Coreg, Cozaar, and Glucophage XL . Encounter Diagnosis  Name Primary?  . UC (ulcerative colitis) Yes

## 2011-12-20 ENCOUNTER — Other Ambulatory Visit (INDEPENDENT_AMBULATORY_CARE_PROVIDER_SITE_OTHER): Payer: Medicare Other

## 2011-12-20 DIAGNOSIS — K5190 Ulcerative colitis, unspecified, without complications: Secondary | ICD-10-CM | POA: Diagnosis not present

## 2011-12-20 LAB — CBC WITH DIFFERENTIAL/PLATELET
Basophils Absolute: 0 10*3/uL (ref 0.0–0.1)
Eosinophils Absolute: 0.1 10*3/uL (ref 0.0–0.7)
MCHC: 33.8 g/dL (ref 30.0–36.0)
MCV: 98 fl (ref 78.0–100.0)
Monocytes Absolute: 0.4 10*3/uL (ref 0.1–1.0)
Neutrophils Relative %: 57.6 % (ref 43.0–77.0)
Platelets: 167 10*3/uL (ref 150.0–400.0)
WBC: 4.5 10*3/uL (ref 4.5–10.5)

## 2012-01-02 HISTORY — PX: OTHER SURGICAL HISTORY: SHX169

## 2012-01-21 ENCOUNTER — Telehealth: Payer: Self-pay | Admitting: *Deleted

## 2012-01-21 DIAGNOSIS — Z9225 Personal history of immunosupression therapy: Secondary | ICD-10-CM

## 2012-01-21 NOTE — Telephone Encounter (Signed)
Informed pt he needs to repeat his CBC d/t his ; pt stated understanding.

## 2012-01-21 NOTE — Telephone Encounter (Signed)
Message copied by Florene Glen on Mon Jan 21, 2012 11:11 AM ------      Message from: Florene Glen      Created: Fri Dec 21, 2011 11:28 AM       CBC in 1 month 6mp therapy

## 2012-01-24 ENCOUNTER — Other Ambulatory Visit (INDEPENDENT_AMBULATORY_CARE_PROVIDER_SITE_OTHER): Payer: Medicare Other

## 2012-01-24 DIAGNOSIS — Z9225 Personal history of immunosupression therapy: Secondary | ICD-10-CM | POA: Diagnosis not present

## 2012-01-24 LAB — CBC WITH DIFFERENTIAL/PLATELET
Basophils Relative: 0.4 % (ref 0.0–3.0)
Eosinophils Absolute: 0.2 10*3/uL (ref 0.0–0.7)
Eosinophils Relative: 3 % (ref 0.0–5.0)
Lymphocytes Relative: 23.2 % (ref 12.0–46.0)
Neutrophils Relative %: 65.5 % (ref 43.0–77.0)
RBC: 4.26 Mil/uL (ref 4.22–5.81)
WBC: 5.5 10*3/uL (ref 4.5–10.5)

## 2012-02-08 DIAGNOSIS — E119 Type 2 diabetes mellitus without complications: Secondary | ICD-10-CM | POA: Diagnosis not present

## 2012-02-08 DIAGNOSIS — H25019 Cortical age-related cataract, unspecified eye: Secondary | ICD-10-CM | POA: Diagnosis not present

## 2012-02-28 ENCOUNTER — Other Ambulatory Visit: Payer: Self-pay | Admitting: Dermatology

## 2012-02-28 DIAGNOSIS — C44721 Squamous cell carcinoma of skin of unspecified lower limb, including hip: Secondary | ICD-10-CM | POA: Diagnosis not present

## 2012-02-28 DIAGNOSIS — Z85828 Personal history of other malignant neoplasm of skin: Secondary | ICD-10-CM | POA: Diagnosis not present

## 2012-02-28 DIAGNOSIS — D485 Neoplasm of uncertain behavior of skin: Secondary | ICD-10-CM | POA: Diagnosis not present

## 2012-03-11 ENCOUNTER — Ambulatory Visit (INDEPENDENT_AMBULATORY_CARE_PROVIDER_SITE_OTHER): Payer: Medicare Other | Admitting: Cardiology

## 2012-03-11 ENCOUNTER — Encounter: Payer: Self-pay | Admitting: Cardiology

## 2012-03-11 VITALS — BP 138/62 | HR 54 | Ht 67.5 in | Wt 215.0 lb

## 2012-03-11 DIAGNOSIS — E785 Hyperlipidemia, unspecified: Secondary | ICD-10-CM

## 2012-03-11 DIAGNOSIS — I200 Unstable angina: Secondary | ICD-10-CM

## 2012-03-11 DIAGNOSIS — I6529 Occlusion and stenosis of unspecified carotid artery: Secondary | ICD-10-CM | POA: Diagnosis not present

## 2012-03-11 DIAGNOSIS — R350 Frequency of micturition: Secondary | ICD-10-CM

## 2012-03-11 DIAGNOSIS — I252 Old myocardial infarction: Secondary | ICD-10-CM

## 2012-03-11 DIAGNOSIS — Z0000 Encounter for general adult medical examination without abnormal findings: Secondary | ICD-10-CM

## 2012-03-11 DIAGNOSIS — E119 Type 2 diabetes mellitus without complications: Secondary | ICD-10-CM | POA: Diagnosis not present

## 2012-03-11 DIAGNOSIS — I10 Essential (primary) hypertension: Secondary | ICD-10-CM

## 2012-03-11 DIAGNOSIS — I2510 Atherosclerotic heart disease of native coronary artery without angina pectoris: Secondary | ICD-10-CM

## 2012-03-11 NOTE — Progress Notes (Signed)
HPI [name] returns today for evaluation and management coronary artery disease and multiple cardiovascular risk factors. His angina is stable. He has not been exercising very much. His weight is up a little bit.  He denies orthopnea, PND or edema. Blood pressure is under good control today. He will be up for blood work in May.  Past Medical History  Diagnosis Date  . Hypertension   . Hyperlipidemia   . Diabetes mellitus   . Bradycardia   . Crohn's colitis 12/01/1997  . Neck pain   . Dysphagia   . Ankylosing spondylitis   . Coronary artery disease     a. s/p CABG x 6 in 1997 (LIMA->LAD, VG->RI ->OM1->OM2, VG->PDA->PLV;  b. 05/2011 Cath:  patent grafs, native prox rca and d2 dzs  ->med rx.  . Pneumonia     hx of PNA  . Blood transfusion     hx of transfusion without reaction  . Kidney stones   . GERD (gastroesophageal reflux disease)   . History of skin cancer   . Heart block   . 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 Hiatal hernia 12/03/2008    Current Outpatient Prescriptions  Medication Sig Dispense Refill  . aspirin 81 MG tablet Take 81 mg by mouth daily.        Marland Kitchen atorvastatin (LIPITOR) 80 MG tablet Take 1 tablet (80 mg total) by mouth daily.  90 tablet  3  . carvedilol (COREG) 6.25 MG tablet Take 1 tablet (6.25 mg total) by mouth 2 (two) times daily with a meal.  180 tablet  3  . folic acid (FOLVITE) 1 MG tablet Take 2.5 tablets (2.5 mg total) by mouth daily.  75 tablet  30  . isosorbide mononitrate (IMDUR) 30 MG 24 hr tablet Take 1 tablet (30 mg total) by mouth daily.  90 tablet  3  . losartan (COZAAR) 100 MG tablet TAKE 1 TABLET ONCE DAILY.  30 tablet  3  . mercaptopurine (PURINETHOL) 50 MG tablet Take 1 tablet (50 mg total) by mouth daily. Give on an empty stomach 1 hour before or 2 hours after meals. Caution: Chemotherapy.  90 tablet  2  . mesalamine (LIALDA) 1.2 G EC tablet Take 2 tablets by mouth once daily  48 tablet  0  . metFORMIN (GLUCOPHAGE XR) 500  MG 24 hr tablet Take 1 tablet (500 mg total) by mouth daily with breakfast.  90 tablet  3  . nitroGLYCERIN (NITROSTAT) 0.4 MG SL tablet Place 0.4 mg under the tongue every 5 (five) minutes as needed. For chest pain      . [DISCONTINUED] escitalopram (LEXAPRO) 10 MG tablet Take 10 mg by mouth daily.       . [DISCONTINUED] esomeprazole (NEXIUM) 40 MG capsule Take 40 mg by mouth as needed.         No current facility-administered medications for this visit.    No Known Allergies  Family History  Problem Relation Age of Onset  . Heart disease Father   . Heart disease Mother   . Heart disease    . Heart disease    . Heart disease    . Crohn's disease    . Crohn's disease    . Colon cancer Neg Hx     History   Social History  . Marital Status: Married    Spouse Name: N/A    Number of Children: 2  . Years of Education: N/A   Occupational History  . semi retired  Social History Main Topics  . Smoking status: Former Smoker    Quit date: 01/02/1983  . Smokeless tobacco: Current User  . Alcohol Use: Yes     Comment: daily  . Drug Use: No  . Sexually Active: Not Currently   Other Topics Concern  . Not on file   Social History Narrative  . No narrative on file    ROS ALL NEGATIVE EXCEPT THOSE NOTED IN HPI  PE  General Appearance: well developed, well nourished in no acute distress, overweight HEENT: symmetrical face, PERRLA, good dentition  Neck: no JVD, thyromegaly, or adenopathy, trachea midline Chest: symmetric without deformity Cardiac: PMI non-displaced, RRR, normal S1, S2, no gallop or murmur Lung: clear to ausculation and percussion Vascular: all pulses full without bruits  Abdominal: distended, nontender, good bowel sounds, no HSM, no bruits Extremities: no cyanosis, clubbing or edema, no sign of DVT, no varicosities  Skin: normal color, no rashes Neuro: alert and oriented x 3, non-focal Pysch: normal affect  EKG  Sinus bradycardia, nonspecific ST  segment changes.  BMET    Component Value Date/Time   NA 137 05/25/2011 1210   K 4.1 05/25/2011 1210   CL 102 05/25/2011 1210   CO2 27 05/25/2011 1210   GLUCOSE 178* 05/25/2011 1210   GLUCOSE 98 12/12/2005 0729   BUN 14 05/25/2011 1210   CREATININE 0.94 05/25/2011 1210   CALCIUM 8.8 05/25/2011 1210   GFRNONAA 82* 05/25/2011 1210   GFRAA >90 05/25/2011 1210    Lipid Panel     Component Value Date/Time   CHOL 158 05/16/2011 0749   TRIG 134.0 05/16/2011 0749   HDL 63.80 05/16/2011 0749   CHOLHDL 2 05/16/2011 0749   VLDL 26.8 05/16/2011 0749   LDLCALC 67 05/16/2011 0749    CBC    Component Value Date/Time   WBC 5.5 01/24/2012 1105   RBC 4.26 01/24/2012 1105   HGB 14.1 01/24/2012 1105   HCT 41.9 01/24/2012 1105   PLT 162.0 01/24/2012 1105   MCV 98.2 01/24/2012 1105   MCH 32.1 05/25/2011 1210   MCHC 33.7 01/24/2012 1105   RDW 14.8* 01/24/2012 1105   LYMPHSABS 1.3 01/24/2012 1105   MONOABS 0.4 01/24/2012 1105   EOSABS 0.2 01/24/2012 1105   BASOSABS 0.0 01/24/2012 1105

## 2012-03-11 NOTE — Patient Instructions (Addendum)
Continue taking current medication as prescribed  Your physician recommends that you return for lab work in May for fasting cholesterol,cmp, and Hemoglobin A1C, PSA  Your physician recommends that you schedule a follow-up appointment in: 6 months with Dr. Tonny Bollman  Your physician discussed the importance of regular exercise and recommended that you start or continue a regular exercise program for good health.  Your physician recommends that you weigh, daily, at the same time every day, and in the same amount of clothing. Please record your daily weights on the handout provided and bring it to your next appointment.  Your physician has requested that you have a carotid duplex in September. This test is an ultrasound of the carotid arteries in your neck. It looks at blood flow through these arteries that supply the brain with blood. Allow one hour for this exam. There are no restrictions or special instructions.

## 2012-03-11 NOTE — Assessment & Plan Note (Signed)
Stable. Cardiac catheterization last year showed patent grafts with nonobstructive disease. Continue secondary preventative therapy. I'll sign him for followup with Dr. Excell Seltzer who knows him well. He's also his neighbor.

## 2012-03-11 NOTE — Assessment & Plan Note (Signed)
We'll schedule blood work in early April.

## 2012-03-11 NOTE — Assessment & Plan Note (Signed)
Good control. Encouraged weight loss and exercise.

## 2012-03-11 NOTE — Assessment & Plan Note (Signed)
We'll schedule carotid Dopplers this fall. He is a hard study with his ankylosing spondylitis.

## 2012-04-15 ENCOUNTER — Other Ambulatory Visit: Payer: Self-pay

## 2012-04-15 MED ORDER — METHYLPREDNISOLONE 4 MG PO KIT: PACK | ORAL | Status: DC

## 2012-05-02 ENCOUNTER — Telehealth: Payer: Self-pay | Admitting: Cardiology

## 2012-05-02 NOTE — Telephone Encounter (Signed)
I spoke with both Andres Smith & wife. Wife calls b/c his leg has been swollen ( for the past 2 weeks) Denies any shortness of breath or angina this week.  It was "hard to get my shoe on this morning". "It bothers my wife more than me" Appt made with Scott for 05/05/12 at 2:20 pm.  Mylo Red RN

## 2012-05-02 NOTE — Telephone Encounter (Signed)
New Prob      Pt is experiencing swelling in his leg. Would like to speak to nurse regarding this and possibly getting a referral for primary care.

## 2012-05-05 ENCOUNTER — Ambulatory Visit (INDEPENDENT_AMBULATORY_CARE_PROVIDER_SITE_OTHER): Payer: Medicare Other | Admitting: Physician Assistant

## 2012-05-05 ENCOUNTER — Encounter: Payer: Self-pay | Admitting: Physician Assistant

## 2012-05-05 VITALS — BP 142/70 | HR 56 | Ht 67.5 in | Wt 215.8 lb

## 2012-05-05 DIAGNOSIS — I2510 Atherosclerotic heart disease of native coronary artery without angina pectoris: Secondary | ICD-10-CM

## 2012-05-05 DIAGNOSIS — R609 Edema, unspecified: Secondary | ICD-10-CM

## 2012-05-05 DIAGNOSIS — E785 Hyperlipidemia, unspecified: Secondary | ICD-10-CM

## 2012-05-05 DIAGNOSIS — I10 Essential (primary) hypertension: Secondary | ICD-10-CM

## 2012-05-05 DIAGNOSIS — R0602 Shortness of breath: Secondary | ICD-10-CM

## 2012-05-05 MED ORDER — FUROSEMIDE 20 MG PO TABS: ORAL_TABLET | ORAL | Status: DC

## 2012-05-05 NOTE — Telephone Encounter (Signed)
Patient seen today. Tereso Newcomer, PA-C  4:38 PM 05/05/2012

## 2012-05-05 NOTE — Progress Notes (Signed)
[address], [address] [place], [place]  [address] Phone: [phone] Fax:  [phone]  Date:  05/05/2012   ID:  Andres Smith, DOB Feb 04, 1940, MRN [id number]  PCP:  None  Primary Cardiologist:  Dr. Valera Castle     History of Present Illness: Andres Smith is a 72 y.o. male who returns for the evaluation of edema.  He has a hx of CAD, s/p CABG, HTN, HL, DM2, inflammatory bowel disease, ankylosing spondylitis.  Carotid U/S 11/12:  0-39% bilat (f/u 11/14).  Last LHC 05/2011:  dLM 40%, mLAD occluded, oD2 70-80%, L-LAD/D2 ok, pD2 90% dist to LIMA touchdown, pCFX occluded, S-RI/OM1/OM2 ok, pRCA 99%, dRCA occluded, S-PDA/PLV ok, mPDA 60-70% beyond graft, EF 50%.  Potential for ischemia from pRCA or D1 after LIMA touchdown.  Vessels not ideal for PCI.  Medical Rx was favored over PCI.  Echo 05/2011: probable inferior HK, EF 55%, diastolic dysfunction, aortic sclerosis without stenosis, mild RVE, mildly reduced RVSF.  Last seen by Dr. Valera Castle 03/2012.  Over the last few weeks he notes LE edema.  No increased DOE.  He is probably NYHA Class II.  No significant CP.  No syncope.  No orthopnea, PND.  No injury.  Notes recent pruritic area on left leg.  No fevers, cough.    Labs (2/12):  TSH 2.30 Labs (5/13):  K 4.1, Cr 0.94, LDL 67,  Labs (7/13):  ALT 23 Labs (1/14):  Hgb 14.1   Wt Readings from Last 3 Encounters:  05/05/12 215 lb 12.8 oz (97.886 kg)  03/11/12 215 lb (97.523 kg)  11/20/11 209 lb 6 oz (94.972 kg)     Past Medical History  Diagnosis Date  . Hypertension   . Hyperlipidemia   . Diabetes mellitus   . Bradycardia   . Crohn's colitis 12/01/1997  . Neck pain   . Dysphagia   . Ankylosing spondylitis   . Coronary artery disease     a. s/p CABG x 6 in 1997 (LIMA->LAD, VG->RI ->OM1->OM2, VG->PDA->PLV;  b. 05/2011 Cath:  patent grafs, native prox rca and d2 dzs  ->med rx.  . Pneumonia     hx of PNA  . Blood transfusion     hx of transfusion without reaction  . Kidney  stones   . GERD (gastroesophageal reflux disease)   . History of skin cancer   . Heart block   . 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 Hiatal hernia 12/03/2008    Current Outpatient Prescriptions  Medication Sig Dispense Refill  . aspirin 81 MG tablet Take 81 mg by mouth daily.        Marland Kitchen atorvastatin (LIPITOR) 80 MG tablet Take 1 tablet (80 mg total) by mouth daily.  90 tablet  3  . carvedilol (COREG) 6.25 MG tablet Take 1 tablet (6.25 mg total) by mouth 2 (two) times daily with a meal.  180 tablet  3  . folic acid (FOLVITE) 1 MG tablet Take 2.5 tablets (2.5 mg total) by mouth daily.  75 tablet  30  . isosorbide mononitrate (IMDUR) 30 MG 24 hr tablet Take 1 tablet (30 mg total) by mouth daily.  90 tablet  3  . losartan (COZAAR) 100 MG tablet TAKE 1 TABLET ONCE DAILY.  30 tablet  3  . mercaptopurine (PURINETHOL) 50 MG tablet Take 1 tablet (50 mg total) by mouth daily. Give on an empty stomach 1 hour before or 2 hours after meals. Caution: Chemotherapy.  90 tablet  2  . mesalamine (LIALDA) 1.2 G EC tablet Take 2 tablets by mouth once daily  48 tablet  0  . metFORMIN (GLUCOPHAGE XR) 500 MG 24 hr tablet Take 1 tablet (500 mg total) by mouth daily with breakfast.  90 tablet  3  . nitroGLYCERIN (NITROSTAT) 0.4 MG SL tablet Place 0.4 mg under the tongue every 5 (five) minutes as needed. For chest pain      . [DISCONTINUED] escitalopram (LEXAPRO) 10 MG tablet Take 10 mg by mouth daily.       . [DISCONTINUED] esomeprazole (NEXIUM) 40 MG capsule Take 40 mg by mouth as needed.         No current facility-administered medications for this visit.    Allergies:   No Known Allergies  Social History:  The patient  reports that he quit smoking about 29 years ago. He uses smokeless tobacco. He reports that  drinks alcohol. He reports that he does not use illicit drugs.   ROS:  Please see the history of present illness.   He has a chronic dry cough.   All other systems reviewed and negative.     PHYSICAL EXAM: VS:  BP 142/70  Pulse 56  Ht 5' 7.5" (1.715 m)  Wt 215 lb 12.8 oz (97.886 kg)  BMI 33.28 kg/m2 Well nourished, well developed, in no acute distress HEENT: normal Neck: no appreciable JVD Cardiac:  normal S1, S2; RRR; no murmur Lungs:  clear to auscultation bilaterally, no wheezing, rhonchi or rales Abd: soft, nontender, no hepatomegaly Ext: 1+ bilateral LE edema Skin: warm and dry; scattered rough areas on bilateral legs (probably actinic keratoses), area above left medial ankle with some erythema/excoriation Neuro:  CNs 2-12 intact, no focal abnormalities noted  EKG:  Sinus brady, HR 56, normal axis, NSSTTW changes     ASSESSMENT AND PLAN:  1. Edema:  Etiology not clear.  May be from venous insufficiency.  He had some mild RVE on last echo.  He is a prior smoker.  He has had normal LVF in the past.  Check BMET, LFTs, CBC, TSH, BNP.  Check repeat echo to assess diastolic fxn and recheck right sided pressures. Will give Rx for Lasix 20 mg QD.  He can take 3-7 days, then PRN.  Keep legs elevated.  May need compression stockings. 2. Rash:  Pruritic area may be related to irritation from external source or from edema.  He can try hydrocortisone bid for a week.  If no improvement he will follow up with his dermatologist. 3. CAD:  No angina.  Continue ASA, statin. 4. Hypertension:  Fair control. Continue current Rx. 5. Hyperlipidemia:  Continue statin. 6. Disposition:  Advised him to get a PCP.  F/u with me in 3 weeks.  Signed, Tereso Newcomer, PA-C  2:44 PM 05/05/2012

## 2012-05-05 NOTE — Patient Instructions (Addendum)
START LASIX 20 MG DAILY FOR 3-7 DAYS AS NEEDED FOR SWELLING; MAKE SURE TO INCREASE POTASSIUM IN YOUR DIET WHILE TAKING THE LASIX, BANANA'S, ORANGE JUICE, POTATOES, TOMATOES,   LABS TOMORROW, BMET, CBC W/DIFF, TSH, BNP, LFT, HgB A1C, PSA, LIPID AND LIVER PANEL, WILL HAVE LABS DONE AT [facility]  PLEASE FOLLOW UP WITH SCOTT WEAVER, PAC IN ABOUT 3 WEEKS, POSSIBLE SAME DAY DR. Excell Seltzer IS IN THE OFFICE

## 2012-05-06 ENCOUNTER — Other Ambulatory Visit (INDEPENDENT_AMBULATORY_CARE_PROVIDER_SITE_OTHER): Payer: Medicare Other

## 2012-05-06 ENCOUNTER — Other Ambulatory Visit: Payer: Medicare Other

## 2012-05-06 DIAGNOSIS — E785 Hyperlipidemia, unspecified: Secondary | ICD-10-CM

## 2012-05-06 DIAGNOSIS — R350 Frequency of micturition: Secondary | ICD-10-CM | POA: Diagnosis not present

## 2012-05-06 DIAGNOSIS — R0602 Shortness of breath: Secondary | ICD-10-CM | POA: Diagnosis not present

## 2012-05-06 DIAGNOSIS — E119 Type 2 diabetes mellitus without complications: Secondary | ICD-10-CM | POA: Diagnosis not present

## 2012-05-06 DIAGNOSIS — R609 Edema, unspecified: Secondary | ICD-10-CM

## 2012-05-06 LAB — BASIC METABOLIC PANEL
BUN: 13 mg/dL (ref 6–23)
CO2: 30 mEq/L (ref 19–32)
Calcium: 8.9 mg/dL (ref 8.4–10.5)
Chloride: 102 mEq/L (ref 96–112)
Creatinine, Ser: 0.8 mg/dL (ref 0.4–1.5)

## 2012-05-06 LAB — CBC WITH DIFFERENTIAL/PLATELET
Basophils Absolute: 0 10*3/uL (ref 0.0–0.1)
Eosinophils Absolute: 0.2 10*3/uL (ref 0.0–0.7)
HCT: 41.6 % (ref 39.0–52.0)
Hemoglobin: 14.2 g/dL (ref 13.0–17.0)
Lymphs Abs: 1.2 10*3/uL (ref 0.7–4.0)
MCHC: 34.1 g/dL (ref 30.0–36.0)
Monocytes Relative: 9.4 % (ref 3.0–12.0)
Neutro Abs: 2.7 10*3/uL (ref 1.4–7.7)
Platelets: 161 10*3/uL (ref 150.0–400.0)
RDW: 15.3 % — ABNORMAL HIGH (ref 11.5–14.6)

## 2012-05-06 LAB — HEPATIC FUNCTION PANEL
Alkaline Phosphatase: 48 U/L (ref 39–117)
Bilirubin, Direct: 0.1 mg/dL (ref 0.0–0.3)
Total Bilirubin: 0.9 mg/dL (ref 0.3–1.2)

## 2012-05-06 LAB — HEMOGLOBIN A1C: Hgb A1c MFr Bld: 8.1 % — ABNORMAL HIGH (ref 4.6–6.5)

## 2012-05-06 LAB — TSH: TSH: 3.51 u[IU]/mL (ref 0.35–5.50)

## 2012-05-06 LAB — LIPID PANEL
LDL Cholesterol: 63 mg/dL (ref 0–99)
Total CHOL/HDL Ratio: 2

## 2012-05-13 ENCOUNTER — Ambulatory Visit (HOSPITAL_COMMUNITY): Payer: Medicare Other | Attending: Cardiology | Admitting: Radiology

## 2012-05-13 DIAGNOSIS — R609 Edema, unspecified: Secondary | ICD-10-CM | POA: Diagnosis not present

## 2012-05-13 DIAGNOSIS — I10 Essential (primary) hypertension: Secondary | ICD-10-CM

## 2012-05-13 DIAGNOSIS — Z87891 Personal history of nicotine dependence: Secondary | ICD-10-CM | POA: Insufficient documentation

## 2012-05-13 DIAGNOSIS — I2510 Atherosclerotic heart disease of native coronary artery without angina pectoris: Secondary | ICD-10-CM | POA: Insufficient documentation

## 2012-05-13 DIAGNOSIS — R079 Chest pain, unspecified: Secondary | ICD-10-CM | POA: Diagnosis not present

## 2012-05-13 DIAGNOSIS — I679 Cerebrovascular disease, unspecified: Secondary | ICD-10-CM | POA: Diagnosis not present

## 2012-05-13 DIAGNOSIS — E119 Type 2 diabetes mellitus without complications: Secondary | ICD-10-CM | POA: Insufficient documentation

## 2012-05-13 DIAGNOSIS — E785 Hyperlipidemia, unspecified: Secondary | ICD-10-CM | POA: Insufficient documentation

## 2012-05-13 DIAGNOSIS — I498 Other specified cardiac arrhythmias: Secondary | ICD-10-CM | POA: Diagnosis not present

## 2012-05-13 NOTE — Progress Notes (Signed)
Echocardiogram performed.  

## 2012-05-15 ENCOUNTER — Telehealth: Payer: Self-pay | Admitting: *Deleted

## 2012-05-15 ENCOUNTER — Encounter: Payer: Self-pay | Admitting: *Deleted

## 2012-05-15 ENCOUNTER — Encounter: Payer: Self-pay | Admitting: Physician Assistant

## 2012-05-15 DIAGNOSIS — E119 Type 2 diabetes mellitus without complications: Secondary | ICD-10-CM

## 2012-05-15 DIAGNOSIS — E785 Hyperlipidemia, unspecified: Secondary | ICD-10-CM

## 2012-05-15 NOTE — Telephone Encounter (Signed)
Message copied by Barrie Folk on Thu May 15, 2012  8:25 AM ------      Message from: Valera Castle C      Created: Tue May 13, 2012  8:45 AM       His hemoglobin A1c is above 7%. He needs to really lose 10-15 pounds and to eliminate as much as possible unnecessary carbohydrates. If this doesn't get it down within 3 months, we will have to add additional medication. ------

## 2012-05-15 NOTE — Telephone Encounter (Signed)
I spoke with pt about his current lab results. He states he will restart his exercise & low carb diet again. With recheck labs in August. Mylo Red RN

## 2012-05-29 ENCOUNTER — Ambulatory Visit: Payer: Medicare Other | Admitting: Physician Assistant

## 2012-05-29 ENCOUNTER — Other Ambulatory Visit: Payer: Self-pay | Admitting: Dermatology

## 2012-05-29 DIAGNOSIS — C44711 Basal cell carcinoma of skin of unspecified lower limb, including hip: Secondary | ICD-10-CM | POA: Diagnosis not present

## 2012-05-29 DIAGNOSIS — D485 Neoplasm of uncertain behavior of skin: Secondary | ICD-10-CM | POA: Diagnosis not present

## 2012-05-29 DIAGNOSIS — L821 Other seborrheic keratosis: Secondary | ICD-10-CM | POA: Diagnosis not present

## 2012-05-29 DIAGNOSIS — L570 Actinic keratosis: Secondary | ICD-10-CM | POA: Diagnosis not present

## 2012-05-29 DIAGNOSIS — L819 Disorder of pigmentation, unspecified: Secondary | ICD-10-CM | POA: Diagnosis not present

## 2012-05-29 DIAGNOSIS — D239 Other benign neoplasm of skin, unspecified: Secondary | ICD-10-CM | POA: Diagnosis not present

## 2012-05-29 DIAGNOSIS — Z85828 Personal history of other malignant neoplasm of skin: Secondary | ICD-10-CM | POA: Diagnosis not present

## 2012-05-29 DIAGNOSIS — D0430 Carcinoma in situ of skin of unspecified part of face: Secondary | ICD-10-CM | POA: Diagnosis not present

## 2012-05-29 DIAGNOSIS — D0439 Carcinoma in situ of skin of other parts of face: Secondary | ICD-10-CM | POA: Diagnosis not present

## 2012-06-05 ENCOUNTER — Ambulatory Visit: Payer: Medicare Other | Admitting: Physician Assistant

## 2012-06-16 ENCOUNTER — Ambulatory Visit (INDEPENDENT_AMBULATORY_CARE_PROVIDER_SITE_OTHER): Payer: Medicare Other | Admitting: Physician Assistant

## 2012-06-16 ENCOUNTER — Encounter: Payer: Self-pay | Admitting: Physician Assistant

## 2012-06-16 VITALS — BP 128/60 | HR 48 | Ht 68.0 in | Wt 210.8 lb

## 2012-06-16 DIAGNOSIS — R609 Edema, unspecified: Secondary | ICD-10-CM | POA: Diagnosis not present

## 2012-06-16 DIAGNOSIS — I10 Essential (primary) hypertension: Secondary | ICD-10-CM

## 2012-06-16 DIAGNOSIS — I2510 Atherosclerotic heart disease of native coronary artery without angina pectoris: Secondary | ICD-10-CM

## 2012-06-16 DIAGNOSIS — R7989 Other specified abnormal findings of blood chemistry: Secondary | ICD-10-CM | POA: Diagnosis not present

## 2012-06-16 DIAGNOSIS — E785 Hyperlipidemia, unspecified: Secondary | ICD-10-CM

## 2012-06-16 DIAGNOSIS — I779 Disorder of arteries and arterioles, unspecified: Secondary | ICD-10-CM | POA: Diagnosis not present

## 2012-06-16 NOTE — Patient Instructions (Addendum)
Repeat CBC today   Schedule carotid dopplers in 11/14   Your physician wants you to follow-up in: 6 months with Dr.Cooper You will receive a reminder letter in the mail two months in advance. If you don't receive a letter, please call our office to schedule the follow-up appointment.

## 2012-06-16 NOTE — Progress Notes (Signed)
[address], [address] [place], [place]  [address] Phone: [phone] Fax:  [phone]  Date:  06/16/2012   ID:  Andres Smith, DOB [birth year], MRN [id number]  PCP:  None  Primary Cardiologist:  Dr. Valera Castle     History of Present Illness: Andres Smith is a 72 y.o. male who returns for f/u on edema.  He has a hx of CAD, s/p CABG, HTN, HL, DM2, inflammatory bowel disease, ankylosing spondylitis.  Carotid U/S 11/12:  0-39% bilat (f/u 11/14).  Last LHC 05/2011:  dLM 40%, mLAD occluded, oD2 70-80%, L-LAD/D2 ok, pD2 90% dist to LIMA touchdown, pCFX occluded, S-RI/OM1/OM2 ok, pRCA 99%, dRCA occluded, S-PDA/PLV ok, mPDA 60-70% beyond graft, EF 50%.  Potential for ischemia from pRCA or D1 after LIMA touchdown.  Vessels not ideal for PCI.  Medical Rx was favored over PCI.  Echo 05/2011: probable inferior HK, EF 55%, diastolic dysfunction, aortic sclerosis without stenosis, mild RVE, mildly reduced RVSF.  I saw him last month for LE edema.   Labs demonstrated normal TSH, BNP, albumin.  Echo 05/16/12: mild LVH, EF 55-60%, normal RVF.  Since last seen, his edema is better.  He is no longer taking Lasix.  No increased DOE. He is probably NYHA Class II. No significant CP. No syncope. No orthopnea, PND.   Labs (2/12):  TSH 2.30 Labs (5/13):  K 4.1, Cr 0.94, LDL 67,  Labs (7/13):  ALT 23 Labs (1/14):  Hgb 14.1  Labs (5/14):  K 4.3, Cr 0.8, ALT 23, Alb 3.9, BNP 70, LDL 63, Hgb 14.2 (MCV 101.2), TSH 3.51  Wt Readings from Last 3 Encounters:  06/16/12 210 lb 12.8 oz (95.618 kg)  05/05/12 215 lb 12.8 oz (97.886 kg)  03/11/12 215 lb (97.523 kg)     Past Medical History  Diagnosis Date  . Hypertension   . Hyperlipidemia   . Diabetes mellitus   . Bradycardia   . Crohn's colitis 12/01/1997  . Neck pain   . Dysphagia   . Ankylosing spondylitis   . Coronary artery disease     a. s/p CABG x 6 in 1997 (LIMA->LAD, VG->RI ->OM1->OM2, VG->PDA->PLV;  b. 05/2011 Cath:  patent grafs, native prox rca  and d2 dzs  ->med rx.  . Pneumonia     hx of PNA  . Blood transfusion     hx of transfusion without reaction  . Kidney stones   . GERD (gastroesophageal reflux disease)   . History of skin cancer   . Heart block   . 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 Hiatal hernia 12/03/2008  . Hx of echocardiogram     Echo 5/14:  Mild LVH, EF 55-60%, NL diast fxn, mild LAE     Current Outpatient Prescriptions  Medication Sig Dispense Refill  . aspirin 81 MG tablet Take 81 mg by mouth daily.        Marland Kitchen atorvastatin (LIPITOR) 80 MG tablet Take 1 tablet (80 mg total) by mouth daily.  90 tablet  3  . carvedilol (COREG) 6.25 MG tablet Take 1 tablet (6.25 mg total) by mouth 2 (two) times daily with a meal.  180 tablet  3  . folic acid (FOLVITE) 1 MG tablet Take 2.5 tablets (2.5 mg total) by mouth daily.  75 tablet  30  . isosorbide mononitrate (IMDUR) 30 MG 24 hr tablet Take 1 tablet (30 mg total) by mouth daily.  90 tablet  3  . losartan (COZAAR) 100 MG tablet  TAKE 1 TABLET ONCE DAILY.  30 tablet  3  . mercaptopurine (PURINETHOL) 50 MG tablet Take 1 tablet (50 mg total) by mouth daily. Give on an empty stomach 1 hour before or 2 hours after meals. Caution: Chemotherapy.  90 tablet  2  . mesalamine (LIALDA) 1.2 G EC tablet Take 2 tablets by mouth once daily  48 tablet  0  . metFORMIN (GLUCOPHAGE XR) 500 MG 24 hr tablet Take 1 tablet (500 mg total) by mouth daily with breakfast.  90 tablet  3  . nitroGLYCERIN (NITROSTAT) 0.4 MG SL tablet Place 0.4 mg under the tongue every 5 (five) minutes as needed. For chest pain      . [DISCONTINUED] escitalopram (LEXAPRO) 10 MG tablet Take 10 mg by mouth daily.       . [DISCONTINUED] esomeprazole (NEXIUM) 40 MG capsule Take 40 mg by mouth as needed.         No current facility-administered medications for this visit.    Allergies:   No Known Allergies  Social History:  The patient  reports that he quit smoking about 29 years ago. He uses smokeless  tobacco. He reports that  drinks alcohol. He reports that he does not use illicit drugs.   ROS:  Please see the history of present illness.   He has a chronic dry cough.   All other systems reviewed and negative.   PHYSICAL EXAM: VS:  BP 128/60  Pulse 48  Ht 5\' 8"  (1.727 m)  Wt 210 lb 12.8 oz (95.618 kg)  BMI 32.06 kg/m2 Well nourished, well developed, in no acute distress HEENT: normal Neck: no appreciable JVD Cardiac:  normal S1, S2; RRR; no murmur Lungs:  clear to auscultation bilaterally, no wheezing, rhonchi or rales Abd: soft, nontender, no hepatomegaly Ext: 1+ bilateral LE edema Skin: warm and dry; scattered rough areas on bilateral legs (probably actinic keratoses), area above left medial ankle with some erythema/excoriation Neuro:  CNs 2-12 intact, no focal abnormalities noted  EKG:  Sinus brady, HR 48, normal axis, NSSTTW changes     ASSESSMENT AND PLAN:  1. Edema:  I suspect this is mainly related to venous insufficiency.  It is currently controlled.  He can continue to use prn Lasix. 2. CAD:  No angina.  Continue ASA, statin. 3. Hypertension:  Good control. Continue current Rx. 4. Hyperlipidemia:  Continue statin. 5. Macrocytosis:  Repeat CBC today.   6. Carotid Stenosis:  Arrange f/u U/S in 11/2012.  7. Disposition:  He plans to get a PCP.  F/u with Dr. Tonny Bollman in 6 mos.  Luna Glasgow, PA-C  3:39 PM 06/16/2012

## 2012-06-17 LAB — CBC WITH DIFFERENTIAL/PLATELET
Basophils Relative: 0.6 % (ref 0.0–3.0)
Eosinophils Absolute: 0.1 10*3/uL (ref 0.0–0.7)
HCT: 39.5 % (ref 39.0–52.0)
Hemoglobin: 13.3 g/dL (ref 13.0–17.0)
Lymphs Abs: 1.2 10*3/uL (ref 0.7–4.0)
MCHC: 33.7 g/dL (ref 30.0–36.0)
MCV: 103.7 fl — ABNORMAL HIGH (ref 78.0–100.0)
Monocytes Absolute: 0.5 10*3/uL (ref 0.1–1.0)
Neutro Abs: 3.1 10*3/uL (ref 1.4–7.7)
RBC: 3.81 Mil/uL — ABNORMAL LOW (ref 4.22–5.81)

## 2012-06-18 ENCOUNTER — Telehealth: Payer: Self-pay | Admitting: *Deleted

## 2012-06-18 ENCOUNTER — Telehealth: Payer: Self-pay | Admitting: Cardiology

## 2012-06-18 DIAGNOSIS — D7589 Other specified diseases of blood and blood-forming organs: Secondary | ICD-10-CM

## 2012-06-18 NOTE — Telephone Encounter (Signed)
Message copied by Tarri Fuller on Wed Jun 18, 2012 10:24 AM ------      Message from: [place], [place] T      Created: Tue Jun 17, 2012  5:37 PM       Hemoglobin normal      MCV elevated      Try to add B12 and folate to blood in lab and use diagnosis macrocytosis      If medicare will not cover, he really needs follow up with primary care to further investigate.      Please arrange referral to PCP      Tereso Newcomer, PA-C        06/17/2012 5:37 PM ------

## 2012-06-18 NOTE — Telephone Encounter (Signed)
Follow Up     Calling in returning call from earlier regarding blood work results. Please call.

## 2012-06-18 NOTE — Telephone Encounter (Signed)
lmptcb to go over lab results. I called our lab today and too late to add on folate, has to be done within 24 hours of lab draw. will put in referral for PCP as per Bing Neighbors. PAC, pt will need to f/u w/PCP for further eval on MCV per Bing Neighbors. PAC

## 2012-06-19 NOTE — Telephone Encounter (Signed)
Follow Up      Pt calling in returning phone call from earlier. Please call back.

## 2012-06-19 NOTE — Telephone Encounter (Signed)
lmptcb x 3 to go over lab results and recommendations per Bing Neighbors. PA. I will try again later; if not I will send out a results letter today

## 2012-06-20 NOTE — Telephone Encounter (Signed)
reached pt today and is now aware of lab results and the need for a PCP due to Macrocystosis. I advised pt to expect a call from our scheduling dept. pt said ok

## 2012-06-24 ENCOUNTER — Telehealth: Payer: Self-pay | Admitting: Internal Medicine

## 2012-06-24 NOTE — Telephone Encounter (Signed)
k

## 2012-06-24 NOTE — Telephone Encounter (Signed)
Cardiology is referring Andres Smith for Primary Care.  He states he used to see Dr. Alease Medina and Dr. Debby Bud.  He would like to re-est with Dr. Debby Bud.

## 2012-07-08 ENCOUNTER — Other Ambulatory Visit: Payer: Self-pay | Admitting: Gastroenterology

## 2012-07-08 NOTE — Telephone Encounter (Signed)
PATIENT NEEDS AN OFFICE VISIT FOR FURTHER REFILLS  

## 2012-07-14 ENCOUNTER — Telehealth: Payer: Self-pay | Admitting: *Deleted

## 2012-07-14 MED ORDER — MESALAMINE 1.2 G PO TBEC: DELAYED_RELEASE_TABLET | ORAL | Status: DC

## 2012-07-14 NOTE — Telephone Encounter (Signed)
Pt reports [facility] never got the order and he is out. Left samples for pt and reordered script for Lialda.

## 2012-07-22 ENCOUNTER — Telehealth: Payer: Self-pay | Admitting: Gastroenterology

## 2012-07-22 MED ORDER — MERCAPTOPURINE 50 MG PO TABS: 50.0000 mg | ORAL_TABLET | Freq: Every day | ORAL | Status: DC

## 2012-07-22 NOTE — Telephone Encounter (Signed)
Spoke to wife, Kathie Rhodes, he only needs refill on the .  Has received the Lialda.  Refill ok'ed by Graciella Freer RN.  Kathie Rhodes said she will make sure he comes for his appointment next week.

## 2012-07-24 ENCOUNTER — Encounter: Payer: Self-pay | Admitting: *Deleted

## 2012-07-30 ENCOUNTER — Ambulatory Visit (INDEPENDENT_AMBULATORY_CARE_PROVIDER_SITE_OTHER): Payer: Medicare Other | Admitting: Gastroenterology

## 2012-07-30 ENCOUNTER — Encounter: Payer: Self-pay | Admitting: Gastroenterology

## 2012-07-30 ENCOUNTER — Other Ambulatory Visit (INDEPENDENT_AMBULATORY_CARE_PROVIDER_SITE_OTHER): Payer: Medicare Other

## 2012-07-30 VITALS — BP 120/52 | HR 52 | Ht 67.5 in | Wt 208.8 lb

## 2012-07-30 DIAGNOSIS — I25810 Atherosclerosis of coronary artery bypass graft(s) without angina pectoris: Secondary | ICD-10-CM

## 2012-07-30 DIAGNOSIS — E119 Type 2 diabetes mellitus without complications: Secondary | ICD-10-CM

## 2012-07-30 DIAGNOSIS — K5190 Ulcerative colitis, unspecified, without complications: Secondary | ICD-10-CM

## 2012-07-30 DIAGNOSIS — M459 Ankylosing spondylitis of unspecified sites in spine: Secondary | ICD-10-CM | POA: Diagnosis not present

## 2012-07-30 NOTE — Patient Instructions (Addendum)
  Please follow up with Dr. Jarold Motto in six months   Your physician has requested that you go to the basement for the following lab work before leaving today: Hemoglobin A1C  _______________________________________________________________                                               We are excited to introduce MyChart, a new best-in-class service that provides you online access to important information in your electronic medical record. We want to make it easier for you to view your health information - all in one secure location - when and where you need it. We expect MyChart will enhance the quality of care and service we provide.  When you register for MyChart, you can:    View your test results.    Request appointments and receive appointment reminders via email.    Request medication renewals.    View your medical history, allergies, medications and immunizations.    Communicate with your physician's office through a password-protected site.    Conveniently print information such as your medication lists.  To find out if MyChart is right for you, please talk to a member of our clinical staff today. We will gladly answer your questions about this free health and wellness tool.  If you are age 73 or older and want a member of your family to have access to your record, you must provide written consent by completing a proxy form available at our office. Please speak to our clinical staff about guidelines regarding accounts for patients younger than age 44.  As you activate your MyChart account and need any technical assistance, please call the MyChart technical support line at [phone]-CHART [phone]) or email your question to [email or web address]. If you email your question(s), please include your name, a return phone number and the best time to reach you.  If you have non-urgent health-related questions, you can send a message to our office through MyChart at  [email or web address]. If you have a medical emergency, call 911.  Thank you for using MyChart as your new health and wellness resource!   MyChart licensed from [employer],  [id number]. Patents Pending.

## 2012-07-30 NOTE — Progress Notes (Signed)
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  He is been on immunosuppressive therapy for over 20 years with relapse of his disease with this medication was recently discontinued.  Since restarting his 6-MP 50 mg a day, his inflammatory bowel disease has gone into remission he is having regular bowel movements without abdominal pain, diarrhea, rectal bleeding.  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  Labs have been checked regularly and has had no evidence of leukopenia from his 6-MP.  Other medications for his inflammatory bowel disease include Lialda 2.4 g a day.  Patient does have adult onset diabetes, and has appointment to see Dr. Illene Regulus on August 19.  He does have occasional angina and is on Coreg 6.5 mg a day, Cozaar 100 mg a day, aspirin 81 mg a day, Lipitor 80 mg a day, Isorbid daily and when necessary sublingual nitroglycerin.  Current Medications, Allergies, Past Medical History, Past Surgical History, Family History and Social History were reviewed in [employer] record.  ROS: All systems were reviewed and are negative unless otherwise stated in the HPI.          Physical Exam: At pressure 120/52, pulse 52 and regular and weight 208 pounds the BMI of 32.2.  I cannot appreciate stigmata of chronic liver disease.  His chest is clear she appear to be in a regular rhythm without murmurs gallops or rubs.  Has a somewhat protuberant abdomen with a ventral hernia but no definite organomegaly, masses or tenderness.  Bowel sounds are normal.  Mental status is normal.  There is no peripheral edema or phlebitis.  Patient has a fixed neck and has no rotation in his spine.    Assessment and Plan: Inflammatory bowel disease in remission on oral  aminosalicylate and.p.o. 6-MP therapy.  We will continue medications as listed above.  I did order hemoglobin A1c today, and he is to continue his Glucophage XR 500 mg per day pending evaluation with Dr. Illene Regulus.  We do every 6 months CBC and liver function testing for his medications otherwise.  He currently is on the care of Dr. Tonny Bollman and cardiology.  Please copy her primary care physician, referring physician, and pertinent subspecialists.

## 2012-08-06 ENCOUNTER — Ambulatory Visit: Payer: Medicare Other | Admitting: Family Medicine

## 2012-08-18 ENCOUNTER — Ambulatory Visit: Payer: Medicare Other

## 2012-08-18 ENCOUNTER — Other Ambulatory Visit: Payer: Medicare Other

## 2012-08-18 DIAGNOSIS — E119 Type 2 diabetes mellitus without complications: Secondary | ICD-10-CM

## 2012-08-18 DIAGNOSIS — E785 Hyperlipidemia, unspecified: Secondary | ICD-10-CM

## 2012-08-18 LAB — LIPID PANEL
Cholesterol: 157 mg/dL (ref 0–200)
HDL: 61.7 mg/dL (ref >39.00)
Triglycerides: 68 mg/dL (ref 0.0–149.0)

## 2012-08-18 LAB — BASIC METABOLIC PANEL
Calcium: 8.8 mg/dL (ref 8.4–10.5)
GFR: 102.2 mL/min (ref >60.00)
Glucose, Bld: 140 mg/dL — ABNORMAL HIGH (ref 70–99)
Sodium: 138 mEq/L (ref 135–145)

## 2012-08-18 LAB — HEMOGLOBIN A1C: Hgb A1c MFr Bld: 7.8 % — ABNORMAL HIGH (ref 4.6–6.5)

## 2012-08-19 ENCOUNTER — Encounter: Payer: Self-pay | Admitting: Internal Medicine

## 2012-08-19 ENCOUNTER — Ambulatory Visit (INDEPENDENT_AMBULATORY_CARE_PROVIDER_SITE_OTHER): Payer: Medicare Other | Admitting: Internal Medicine

## 2012-08-19 VITALS — BP 134/60 | HR 59 | Temp 97.2°F | Ht 68.5 in | Wt 210.8 lb

## 2012-08-19 DIAGNOSIS — I10 Essential (primary) hypertension: Secondary | ICD-10-CM | POA: Diagnosis not present

## 2012-08-19 DIAGNOSIS — E119 Type 2 diabetes mellitus without complications: Secondary | ICD-10-CM | POA: Diagnosis not present

## 2012-08-19 DIAGNOSIS — E785 Hyperlipidemia, unspecified: Secondary | ICD-10-CM | POA: Diagnosis not present

## 2012-08-19 NOTE — Assessment & Plan Note (Signed)
Diabetes is not optimally controlled on the current dose of metformin. Andres Smith A1C goal is 7.0 and his A1C was measured as 7.8 this week  Plan: Increase metformin dose to 500 mg, BID Re-check you A1C in three months and will assess progress at that time. If necessary, we will consider increasing your dose at that time. Patient was counseled regarding the importance of diet and exercise in managing diabetes. He will strive to increase his exercise back to 4x weekly, 30-40 minutes of treadmill walking.

## 2012-08-19 NOTE — Assessment & Plan Note (Signed)
Hypertension is well-managed at this time (it was 134/60) by the current regimen. Plan: Please continue to take losartan as directed.

## 2012-08-19 NOTE — Patient Instructions (Addendum)
Thanks for working with me Andres Smith) today!  Your physical didn't detect any abnormalities.  It seems that you are mostly healthy and that your heart problems are managed by cardiology.  To help keep you healthy, I would like you to exercise for at least half an hour, at least three times per week and to make sure to eat a healthy, low-fat, low-salt diet.  Diabetes: you have diabetes.  So far this has been managed with metformin, but now, we should increase your dose.  We measure the A1C to look at how your diabetes is doing.  Your goal is 7.8 and our goal is 7.0.  Results are posted on the MyChart portal. Plan: Please take the metformin as directed (500 mg, twice per day). We will check you A1C in three months and will assess progress at that time; we will consider increasing your dose at that time.  Hypertension (high blood pressure): this is well-managed at this time (it was 134/60).  In addition to helping with blood pressure, this will help protect your kidneys from diabetes. Plan: Please continue to take losartan as directed.  High cholesterol: This is well-managed by medications.  Your goal is 80 and your level was 82. Plan: Keep up the good work: Please keep taking the atorvastatin as directed.  Colitis: Dr. Jarold Motto is taking care of this. Plan: please follow up with him and take the medication ( ) as directed.  Advanced care planning: please read the handout we provided you and visit the website for [facility] at [email or web address] .  Please review your advanced care planning materials (your power of attorney, living will, etc.) to make sure these are in line with your goals and values.  MyChart: this is our online web portal.  On it, you can access your lab results and an interpretation of them.  Additionally, you can send a note to Dr. Debby Bud via the portal.  It is HIPAA-compliant and is the best avenue for [place] to communicate with you (outside visits).

## 2012-08-19 NOTE — Assessment & Plan Note (Signed)
High cholesterol: This is well-managed by medications.   Andres Smith goal is 57 and actual level was 46 today. Plan: continue taking the atorvastatin as directed

## 2012-08-19 NOTE — Progress Notes (Signed)
Subjective:     Patient ID: Andres Smith, male   DOB: [birth year], 72 y.o.   MRN: [id number]  HPI Andres Smith is a 72 year-old gentleman here to establish care.  He has been at the [facility] group for GI and cardiology and is establishing care with a PCP for the first time since being a patient of Dr.Sidney Hughson.  He has no acute complaints.  He denies any polydipsia, polyuria, sweating, chills, shivers, or shakes.  Past Medical History  Diagnosis Date   Hypertension - Previously managed by Dr. Daleen Squibb    Hyperlipidemia - Previously managed by Dr. Daleen Squibb    Diabetes mellitus - diagnosed about eight years ago; managed w/ metformin    Bradycardia    Crohn's colitis 12/01/1997   Neck pain    Dysphagia - This has not bothered Andres Smith for some time    Ankylosing spondylitis    Coronary artery disease     a. s/p CABG x 6 in 1997 (LIMA->LAD, VG->RI ->OM1->OM2, VG->PDA->PLV;  b. 05/2011 Cath:  patent grafs, native prox rca and d2 dzs  ->med rx.   Pneumonia     hx of PNA   Blood transfusion     hx of transfusion without reaction   Kidney stones - Last one 15 years ago.    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   Hiatal hernia 12/03/2008   Hx of echocardiogram     Echo 5/14:  Mild LVH, EF 55-60%, NL diast fxn, mild LAE    Fatty liver    Cholelithiasis    Past Surgical History  Procedure Laterality Date   Coronary artery bypass graft  04/19/1995    x7   Umbilical hernia repair     Trigger finger release     Cancer removal  2014    removed from left outter leg   Tonsillectomy and adenoidectomy  1944   Hernia repair  1989   Colonoscopy  2012   Family History  Problem Relation Age of Onset   Heart disease Father    Heart disease Mother    Arthritis Mother    Heart disease     Crohn's  disease     Colon cancer Neg Hx    Heart disease Sister    Cancer Paternal Grandmother     colon   History   Social History   Marital Status: Married    Spouse Name: N/A    Number of Children: 2   Years of Education: N/A   Occupational History   semi retired    Social History Main Topics   Smoking status: Former Smoker    Quit date: 01/02/1983   Smokeless tobacco: Current User   Alcohol Use: Yes     Comment: daily   Drug Use: No   Sexual Activity: Not Currently   Other Topics Concern   Not on file   Social History Narrative   HS[employer], [employer] - Civil engineering/education in industrial, [place] from [place]. Married '64. 1 son '74, 1 dtr - '69; 1 grandchild, 1 on the way. 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 Lives with wife.  ACP -            Review of Systems HEENT: No headache, change in vision, change in hearing, tinnitus, ear pain, occasional lightheadedness CV: Negative for palpitations or a racing heart beat.  Occasional chest pain at rest (managed by Dr. Excell Seltzer, Cardiologist). Pulm: No SOB, cough, choking, wheezing GI: no problems swallowing, no stom pain, no N/V, no constipation/diarrhea, no blood in stool GU: No nocturia, no waxing/waning stream, no dribbling.  No erections (since his heart surgery)    Objective:   Physical Exam General: 72 year-old gentleman in NAD HEENT: fundoscopy unremarkable, TM's bilaterally clear and quiet, CN 2-12 intact, mouth exhibited no abnormalities, Neck: supple, no adenopathy, thyroid palpable but not enlarged and without nodules. CV: RRR, normal S1/S2, no murmurs, rubs or gallops, slight bruit appreciated on the right carotid artery,  Pulm: no increase WOB, no rales or wheezes GI: bowel sounds nl, abdomen soft and non-tender,  no guarding present    Assessment and Plan:

## 2012-08-20 ENCOUNTER — Ambulatory Visit: Payer: Medicare Other | Admitting: Internal Medicine

## 2012-08-21 ENCOUNTER — Other Ambulatory Visit: Payer: Self-pay

## 2012-08-21 MED ORDER — LOSARTAN POTASSIUM 100 MG PO TABS: ORAL_TABLET | ORAL | Status: DC

## 2012-08-21 MED ORDER — CARVEDILOL 6.25 MG PO TABS: 6.2500 mg | ORAL_TABLET | Freq: Two times a day (BID) | ORAL | Status: DC

## 2012-09-10 ENCOUNTER — Other Ambulatory Visit: Payer: Self-pay | Admitting: *Deleted

## 2012-09-10 ENCOUNTER — Other Ambulatory Visit: Payer: Self-pay | Admitting: Gastroenterology

## 2012-09-10 MED ORDER — MESALAMINE 1.2 G PO TBEC: DELAYED_RELEASE_TABLET | ORAL | Status: DC

## 2012-09-11 ENCOUNTER — Telehealth: Payer: Self-pay | Admitting: *Deleted

## 2012-09-11 MED ORDER — METFORMIN HCL 500 MG PO TABS: 500.0000 mg | ORAL_TABLET | Freq: Two times a day (BID) | ORAL | Status: DC

## 2012-09-11 NOTE — Telephone Encounter (Signed)
pts wife called states pt says Dr Debby Bud increased his Metformin to BID.  Pt needs a refill and is requesting new refill to be called into [facility] for Metformin BID.  Please advise

## 2012-09-11 NOTE — Telephone Encounter (Signed)
rx sent to pharmacy

## 2012-09-12 ENCOUNTER — Other Ambulatory Visit: Payer: Self-pay | Admitting: *Deleted

## 2012-09-12 MED ORDER — METFORMIN HCL 500 MG PO TABS: 500.0000 mg | ORAL_TABLET | Freq: Two times a day (BID) | ORAL | Status: DC

## 2012-09-12 NOTE — Telephone Encounter (Signed)
Spoke with pt advised of MDs order 

## 2012-09-22 ENCOUNTER — Telehealth: Payer: Self-pay | Admitting: Gastroenterology

## 2012-09-22 MED ORDER — MESALAMINE 1.2 G PO TBEC: DELAYED_RELEASE_TABLET | ORAL | Status: DC

## 2012-09-22 NOTE — Telephone Encounter (Signed)
Refill has been sent to the pharmacy pt has been notified

## 2012-09-22 NOTE — Addendum Note (Signed)
Addended by: Donata Duff on: 09/22/2012 03:02 PM   Modules accepted: Orders

## 2012-09-26 ENCOUNTER — Ambulatory Visit (INDEPENDENT_AMBULATORY_CARE_PROVIDER_SITE_OTHER): Payer: Medicare Other | Admitting: Cardiovascular Disease

## 2012-09-26 ENCOUNTER — Encounter: Payer: Self-pay | Admitting: Cardiovascular Disease

## 2012-09-26 VITALS — BP 110/60 | HR 59 | Ht 68.5 in | Wt 206.0 lb

## 2012-09-26 DIAGNOSIS — I658 Occlusion and stenosis of other precerebral arteries: Secondary | ICD-10-CM | POA: Diagnosis not present

## 2012-09-26 DIAGNOSIS — I6529 Occlusion and stenosis of unspecified carotid artery: Secondary | ICD-10-CM | POA: Diagnosis not present

## 2012-09-26 DIAGNOSIS — I2510 Atherosclerotic heart disease of native coronary artery without angina pectoris: Secondary | ICD-10-CM

## 2012-09-26 DIAGNOSIS — I6523 Occlusion and stenosis of bilateral carotid arteries: Secondary | ICD-10-CM

## 2012-09-26 MED ORDER — ISOSORBIDE MONONITRATE ER 30 MG PO TB24: 30.0000 mg | ORAL_TABLET | Freq: Every day | ORAL | Status: DC

## 2012-09-26 MED ORDER — ATORVASTATIN CALCIUM 80 MG PO TABS: 80.0000 mg | ORAL_TABLET | Freq: Every day | ORAL | Status: DC

## 2012-09-26 NOTE — Patient Instructions (Addendum)
Your physician wants you to follow-up in: 6 MONTHS with Dr Excell Seltzer.  You will receive a reminder letter in the mail two months in advance. If you don't receive a letter, please call our office to schedule the follow-up appointment.  Your physician has requested that you have a carotid duplex in NOVEMBER. This test is an ultrasound of the carotid arteries in your neck. It looks at blood flow through these arteries that supply the brain with blood. Allow one hour for this exam. There are no restrictions or special instructions.  Your physician recommends that you continue on your current medications as directed. Please refer to the Current Medication list given to you today.

## 2012-09-26 NOTE — Progress Notes (Signed)
HPI:   72 year old gentleman presenting for followup evaluation. The patient has previously been followed by Dr. wall. He has coronary artery disease with history of remote CABG. His last cardiac catheterization was in 2013. This demonstrated continued patency of his bypass grafts. He was noted to have native vessel disease with severe stenosis of the small diagonal beyond the LIMA insertion site. Medical therapy was recommended because of the small vessel size. Labs from 08/18/2012 demonstrated normal renal function with a creatinine of 0.8, potassium 4.2, and a lipid panel demonstrated a cholesterol of 157, triglycerides 68, HDL 62, and LDL 82.  The patient is doing well. He has occasional slight chest pains, unrelated to exertion. He plays golf regularly. Yesterday he walked 6 holes on the golf course. He has no symptoms with that level of exertion. He denies shortness of breath, palpitations, or presyncope. He's had no leg swelling. He does complain of dizziness when he rolls a certain way in his bed. This resolves after a brief time.  Outpatient Encounter Prescriptions as of 09/26/2012  Medication Sig Dispense Refill  . aspirin 81 MG tablet Take 81 mg by mouth daily.        Marland Kitchen atorvastatin (LIPITOR) 80 MG tablet Take 1 tablet (80 mg total) by mouth daily.  90 tablet  3  . carvedilol (COREG) 6.25 MG tablet Take 1 tablet (6.25 mg total) by mouth 2 (two) times daily with a meal.  180 tablet  3  . folic acid (FOLVITE) 1 MG tablet Take 1 mg by mouth daily.      . isosorbide mononitrate (IMDUR) 30 MG 24 hr tablet Take 1 tablet (30 mg total) by mouth daily.  90 tablet  3  . losartan (COZAAR) 100 MG tablet TAKE 1 TABLET ONCE DAILY.  90 tablet  3  . mercaptopurine (PURINETHOL) 50 MG tablet Take 1 tablet (50 mg total) by mouth daily. Give on an empty stomach 1 hour before or 2 hours after meals. Caution: Chemotherapy.  90 tablet  2  . mesalamine (LIALDA) 1.2 G EC tablet Take 2 tablets by mouth daily  180  tablet  6  . metFORMIN (GLUCOPHAGE) 500 MG tablet Take 1 tablet (500 mg total) by mouth 2 (two) times daily with a meal.  60 tablet  0  . nitroGLYCERIN (NITROSTAT) 0.4 MG SL tablet Place 0.4 mg under the tongue every 5 (five) minutes as needed. For chest pain       No facility-administered encounter medications on file as of 09/26/2012.    No Known Allergies  Past Medical History  Diagnosis Date  . Hypertension   . Hyperlipidemia   . Diabetes mellitus   . Bradycardia   . Crohn's colitis 12/01/1997  . Neck pain   . Dysphagia   . Ankylosing spondylitis   . Coronary artery disease     a. s/p CABG x 6 in 1997 (LIMA->LAD, VG->RI ->OM1->OM2, VG->PDA->PLV;  b. 05/2011 Cath:  patent grafs, native prox rca and d2 dzs  ->med rx.  . Pneumonia     hx of PNA  . Blood transfusion     hx of transfusion without reaction  . Kidney stones   . GERD (gastroesophageal reflux disease)   . History of skin cancer   . Heart block   . 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 Hiatal hernia 12/03/2008  . Hx of echocardiogram     Echo 5/14:  Mild LVH, EF 55-60%, NL diast fxn, mild LAE   .  Fatty liver   . Cholelithiasis     ROS: Negative except as per HPI  BP 110/60  Pulse 59  Ht 5' 8.5" (1.74 m)  Wt 93.441 kg (206 lb)  BMI 30.86 kg/m2  SpO2 95%  PHYSICAL EXAM: Pt is alert and oriented, NAD HEENT: normal Neck: JVP - normal, carotids 2+= without bruits Lungs: CTA bilaterally CV: RRR without murmur or gallop Abd: soft, NT, Positive BS Ext: no C/C/E, distal pulses intact and equal Skin: warm/dry no rash  2-D echocardiogram 05/13/2012: Left ventricle: The cavity size was normal. Wall thickness was increased in a pattern of mild LVH. Systolic function was normal. The estimated ejection fraction was in the range of 55% to 60%. Wall motion was normal; there were no regional wall motion abnormalities. The transmitral flow pattern was normal. The deceleration time of the  early transmitral flow velocity was normal. The pulmonary vein flow pattern was normal. The tissue Doppler parameters were normal. Left ventricular diastolic function parameters were normal.  ------------------------------------------------------------ Aortic valve: Structurally normal valve. Trileaflet. Cusp separation was normal. Doppler: Transvalvular velocity was within the normal range. There was no stenosis. No regurgitation.  ------------------------------------------------------------ Aorta: The aorta was normal, not dilated, and non-diseased.  ------------------------------------------------------------ Mitral valve: Mildly thickened leaflets . Leaflet separation was normal. Doppler: Transvalvular velocity was within the normal range. There was no evidence for stenosis. No regurgitation. Peak gradient: 4mm Hg (D).  ------------------------------------------------------------ Left atrium: The atrium was mildly dilated.  ------------------------------------------------------------ Right ventricle: The cavity size was normal. Wall thickness was normal. Systolic function was normal.  ------------------------------------------------------------ Pulmonic valve: Structurally normal valve. Cusp separation was normal. Doppler: Transvalvular velocity was within the normal range. No regurgitation.  ------------------------------------------------------------ Tricuspid valve: Structurally normal valve. Leaflet separation was normal. Doppler: Transvalvular velocity was within the normal range. No regurgitation.  ------------------------------------------------------------ Right atrium: The atrium was at the upper limits of normal in size.  ------------------------------------------------------------ Pericardium: The pericardium was normal in appearance.  ------------------------------------------------------------ Systemic veins: Inferior vena cava: The vessel was normal in  size; the respirophasic diameter changes were in the normal range (= 50%); findings are consistent with normal central venous pressure.   Cardiac catheterization 05/23/2011: Procedural Findings:  Hemodynamics:  AO 131/60  LV 130/16  Coronary angiography:  Coronary dominance: right  Left mainstem: 40% distal LM stenosis.  Left anterior descending (LAD): Total occlusion of the mid LAD just after a moderate 2nd diagonal with 70-80% ostial stenosis. Patent sequential LIMA to D2 and LAD (touching initially on D2). 90% stenosis in the proximal D2 just distal to the LIMA touchdown. The LAD beyond the LIMA touchdown has mild disease.  Left circumflex (LCx): Totally occluded proximal LCx. Sequential SVG to ramus/OM1/OM2 was patent. The native vessels were relatively small.  Right coronary artery (RCA): There was a 99% stenosis in the proximal RCA. The RCA then gave rise to 3 small acute marginals. The distal RCA was totally occluded. Sequential SVG to PDA and PLV was patent. There was a 60-70% stenosis in the mid PDA beyond the SVG touchdown.  Left ventriculography: Hand LV-gram. EF 50%.  Final Conclusions: Grafts are patent. There are two areas that I see that could be causing ischemia. One is the 99% stenosis in the proximal RCA. The at-risk area here are 3 small acute marginals prior the distal RCA occlusion. I do not think intervention here is worthwhile. The second area is D2 which has a 90% stenosis after LIMA touchdown. PCI here would be feasible and this could be the culprit  for his angina. However, the vessel is small and borderline for stenting.  Recommendations: I think that initial medical management is appropriate here. Cardiac enzymes were negative. HR is too low for beta blocker, will add Imdur 30 mg daily. Would get full echo.  Marca Ancona  05/24/2011, 10:20 AM  ASSESSMENT AND PLAN: 1. Coronary artery disease status post CABG. The patient is stable with only rare episodes of chest  discomfort. He has not taken any nitroglycerin in the interval since his last visit. He will continue his same medications and I will see him back in 6 months. I encouraged him to continue to work on diet and exercise.  2. Hypertension. Blood pressure is well controlled on his current medical program which includes carvedilol, isosorbide, and losartan.  3. Hyperlipidemia. His lipids were reviewed as above. He is on atorvastatin 80 mg daily.  4. Type 2 diabetes. Followed by Dr. Debby Bud. Plans noted for repeat hemoglobin A1c in 3 months after an increase in his metformin dose. Last hemoglobin A1c was 7.8. Emphasized the significant impact of weight loss on glycemic control.  Tonny Bollman 09/26/2012 9:18 AM

## 2012-10-06 ENCOUNTER — Other Ambulatory Visit: Payer: Self-pay | Admitting: Cardiovascular Disease

## 2012-11-10 ENCOUNTER — Encounter (HOSPITAL_COMMUNITY): Payer: Medicare Other

## 2012-11-11 ENCOUNTER — Ambulatory Visit (HOSPITAL_COMMUNITY): Payer: Medicare Other | Attending: Cardiovascular Disease

## 2012-11-11 DIAGNOSIS — I2510 Atherosclerotic heart disease of native coronary artery without angina pectoris: Secondary | ICD-10-CM | POA: Diagnosis not present

## 2012-11-11 DIAGNOSIS — E119 Type 2 diabetes mellitus without complications: Secondary | ICD-10-CM | POA: Insufficient documentation

## 2012-11-11 DIAGNOSIS — I658 Occlusion and stenosis of other precerebral arteries: Secondary | ICD-10-CM | POA: Diagnosis not present

## 2012-11-11 DIAGNOSIS — E785 Hyperlipidemia, unspecified: Secondary | ICD-10-CM | POA: Insufficient documentation

## 2012-11-11 DIAGNOSIS — I6529 Occlusion and stenosis of unspecified carotid artery: Secondary | ICD-10-CM | POA: Insufficient documentation

## 2012-11-11 DIAGNOSIS — I10 Essential (primary) hypertension: Secondary | ICD-10-CM | POA: Insufficient documentation

## 2012-11-11 DIAGNOSIS — Z951 Presence of aortocoronary bypass graft: Secondary | ICD-10-CM | POA: Diagnosis not present

## 2012-11-11 DIAGNOSIS — I6523 Occlusion and stenosis of bilateral carotid arteries: Secondary | ICD-10-CM

## 2012-11-11 DIAGNOSIS — Z23 Encounter for immunization: Secondary | ICD-10-CM | POA: Diagnosis not present

## 2012-11-17 ENCOUNTER — Encounter: Payer: Self-pay | Admitting: Cardiovascular Disease

## 2012-11-17 NOTE — Telephone Encounter (Signed)
New Problem:  Pt's wife states she ould like someone to call with her husbands carotid results. Please advise

## 2012-11-17 NOTE — Telephone Encounter (Signed)
This encounter was created in error - please disregard.

## 2012-11-18 ENCOUNTER — Other Ambulatory Visit (INDEPENDENT_AMBULATORY_CARE_PROVIDER_SITE_OTHER): Payer: Medicare Other

## 2012-11-18 ENCOUNTER — Other Ambulatory Visit: Payer: Self-pay | Admitting: Internal Medicine

## 2012-11-18 DIAGNOSIS — E119 Type 2 diabetes mellitus without complications: Secondary | ICD-10-CM

## 2012-11-20 ENCOUNTER — Encounter: Payer: Self-pay | Admitting: Gastroenterology

## 2012-12-04 DIAGNOSIS — L821 Other seborrheic keratosis: Secondary | ICD-10-CM | POA: Diagnosis not present

## 2012-12-04 DIAGNOSIS — L570 Actinic keratosis: Secondary | ICD-10-CM | POA: Diagnosis not present

## 2012-12-04 DIAGNOSIS — Z85828 Personal history of other malignant neoplasm of skin: Secondary | ICD-10-CM | POA: Diagnosis not present

## 2013-01-06 ENCOUNTER — Ambulatory Visit (INDEPENDENT_AMBULATORY_CARE_PROVIDER_SITE_OTHER): Payer: Medicare Other | Admitting: Gastroenterology

## 2013-01-06 ENCOUNTER — Encounter: Payer: Self-pay | Admitting: Gastroenterology

## 2013-01-06 ENCOUNTER — Other Ambulatory Visit (INDEPENDENT_AMBULATORY_CARE_PROVIDER_SITE_OTHER): Payer: Medicare Other

## 2013-01-06 VITALS — BP 150/74 | HR 54 | Ht 67.5 in | Wt 209.1 lb

## 2013-01-06 DIAGNOSIS — E119 Type 2 diabetes mellitus without complications: Secondary | ICD-10-CM | POA: Diagnosis not present

## 2013-01-06 DIAGNOSIS — M459 Ankylosing spondylitis of unspecified sites in spine: Secondary | ICD-10-CM | POA: Diagnosis not present

## 2013-01-06 DIAGNOSIS — D899 Disorder involving the immune mechanism, unspecified: Secondary | ICD-10-CM

## 2013-01-06 DIAGNOSIS — Z951 Presence of aortocoronary bypass graft: Secondary | ICD-10-CM

## 2013-01-06 DIAGNOSIS — D849 Immunodeficiency, unspecified: Secondary | ICD-10-CM

## 2013-01-06 DIAGNOSIS — K5190 Ulcerative colitis, unspecified, without complications: Secondary | ICD-10-CM

## 2013-01-06 LAB — CBC WITH DIFFERENTIAL/PLATELET
BASOS PCT: 0.3 % (ref 0.0–3.0)
Basophils Absolute: 0 10*3/uL (ref 0.0–0.1)
EOS PCT: 1.5 % (ref 0.0–5.0)
Eosinophils Absolute: 0.1 10*3/uL (ref 0.0–0.7)
HCT: 40.3 % (ref 39.0–52.0)
HEMOGLOBIN: 13.6 g/dL (ref 13.0–17.0)
LYMPHS ABS: 1.2 10*3/uL (ref 0.7–4.0)
Lymphocytes Relative: 22.9 % (ref 12.0–46.0)
MCHC: 33.9 g/dL (ref 30.0–36.0)
MCV: 103.1 fl — ABNORMAL HIGH (ref 78.0–100.0)
Monocytes Absolute: 0.6 10*3/uL (ref 0.1–1.0)
Monocytes Relative: 12 % (ref 3.0–12.0)
Neutro Abs: 3.3 10*3/uL (ref 1.4–7.7)
Neutrophils Relative %: 63.3 % (ref 43.0–77.0)
Platelets: 158 10*3/uL (ref 150.0–400.0)
RBC: 3.91 Mil/uL — AB (ref 4.22–5.81)
RDW: 15.3 % — ABNORMAL HIGH (ref 11.5–14.6)
WBC: 5.3 10*3/uL (ref 4.5–10.5)

## 2013-01-06 LAB — IBC PANEL
Iron: 99 ug/dL (ref 42–165)
SATURATION RATIOS: 26.1 % (ref 20.0–50.0)
Transferrin: 271 mg/dL (ref 212.0–360.0)

## 2013-01-06 LAB — VITAMIN B12: Vitamin B-12: 290 pg/mL (ref 211–911)

## 2013-01-06 LAB — BASIC METABOLIC PANEL
BUN: 15 mg/dL (ref 6–23)
CO2: 29 mEq/L (ref 19–32)
Calcium: 9.3 mg/dL (ref 8.4–10.5)
Chloride: 102 mEq/L (ref 96–112)
Creatinine, Ser: 1 mg/dL (ref 0.4–1.5)
GFR: 75.17 mL/min (ref >60.00)
GLUCOSE: 137 mg/dL — AB (ref 70–99)
POTASSIUM: 4.7 meq/L (ref 3.5–5.1)
Sodium: 138 mEq/L (ref 135–145)

## 2013-01-06 LAB — HEPATIC FUNCTION PANEL
ALT: 28 U/L (ref 0–53)
AST: 32 U/L (ref 0–37)
Albumin: 4.3 g/dL (ref 3.5–5.2)
Alkaline Phosphatase: 47 U/L (ref 39–117)
Bilirubin, Direct: 0.2 mg/dL (ref 0.0–0.3)
Total Bilirubin: 1 mg/dL (ref 0.3–1.2)
Total Protein: 7.2 g/dL (ref 6.0–8.3)

## 2013-01-06 LAB — FOLATE

## 2013-01-06 LAB — FERRITIN: Ferritin: 123.1 ng/mL (ref 22.0–322.0)

## 2013-01-06 LAB — TSH: TSH: 3.71 u[IU]/mL (ref 0.35–5.50)

## 2013-01-06 MED ORDER — NA SULFATE-K SULFATE-MG SULF 17.5-3.13-1.6 GM/177ML PO SOLN: ORAL | Status: DC

## 2013-01-06 MED ORDER — BALSALAZIDE DISODIUM 1.1 G PO TABS: 1.0000 | ORAL_TABLET | Freq: Two times a day (BID) | ORAL | Status: DC

## 2013-01-06 NOTE — Progress Notes (Signed)
This is a 73 year old Caucasian male with altered colitis-colitis associated with ankylosing spondylitis for over 30 years.  He currently is in remission on 6-MP and by mouth amino salicylates.  7 regular bowel movements without melena or hematochezia.  Last colonoscopy with dysplasia screening was 3 years ago.  He denies upper GI or hepatobiliary complaints.  His appetite is good and his weight is stable.  He is on metformin for adult onset diabetes, and discontinue use alcohol daily.  He is status post coronary artery bypass is followed by Dr. Burt Knack and cardiology.  He denies current cardiovascular pulmonary or other general medical problems.  Current Medications, Allergies, Past Medical History, Past Surgical History, Family History and Social History were reviewed in [employer] record.  ROS: All systems were reviewed and are negative unless otherwise stated in the HPI.          Physical Exam: Blood pressure 150/74, pulse 54 and regular and weight 209 with a BMI of 32.25.  I cannot appreciate stigmata of chronic liver disease.  Chest is clear he appears to be in a regular rhythm without murmurs gallops or rubs.  There is no hepatosplenomegaly, abdominal masses or tenderness.  Bowel sounds are normal.  Peripheral extremities are unremarkable.  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  He currently is in remission on low-dose 6-MP and low-dose amino salicylates, but medications renewed.  We will do his 3 year screening dysplasia colonoscopy biopsy shortly.  His continue his other medications per cardiology and primary care.  He again is been advised to be cautious about alcohol intake.  He denies any active cardiovascular pulmonary complaints, no symptoms of angina, and plays golf several times a week.  He does take aspirin 81 mg a week, and have advised him to take other medications  as listed and reviewed.  CBC ,a liver profile, and anemia profile ordered for review

## 2013-01-06 NOTE — Patient Instructions (Addendum)
You have been scheduled for a colonoscopy with propofol. Please follow written instructions given to you at your visit today.  Please pick up your prep kit at the pharmacy within the next 1-3 days. If you use inhalers (even only as needed), please bring them with you on the day of your procedure. Your physician has requested that you go to www.startemmi.com and enter the access code given to you at your visit today. This web site gives a general overview about your procedure. However, you should still follow specific instructions given to you by our office regarding your preparation for the procedure.  DO NOT TAKE METFORMIN DAY OF PROCEDURE   We have given you samples of the following medication to take: Giazo 1.1 gram, take one tablet twice daily   Your physician has requested that you go to the basement for the following lab work before leaving today: Anemia Panel CBC TSH Liver Function Panel  BMP

## 2013-01-14 ENCOUNTER — Encounter: Payer: Self-pay | Admitting: Gastroenterology

## 2013-01-14 ENCOUNTER — Ambulatory Visit (AMBULATORY_SURGERY_CENTER): Payer: Medicare Other | Admitting: Gastroenterology

## 2013-01-14 VITALS — BP 116/61 | HR 48 | Temp 96.9°F | Resp 11 | Ht 67.0 in | Wt 209.0 lb

## 2013-01-14 DIAGNOSIS — I2510 Atherosclerotic heart disease of native coronary artery without angina pectoris: Secondary | ICD-10-CM | POA: Diagnosis not present

## 2013-01-14 DIAGNOSIS — K219 Gastro-esophageal reflux disease without esophagitis: Secondary | ICD-10-CM | POA: Diagnosis not present

## 2013-01-14 DIAGNOSIS — K5190 Ulcerative colitis, unspecified, without complications: Secondary | ICD-10-CM | POA: Diagnosis not present

## 2013-01-14 DIAGNOSIS — Z1211 Encounter for screening for malignant neoplasm of colon: Secondary | ICD-10-CM

## 2013-01-14 DIAGNOSIS — E119 Type 2 diabetes mellitus without complications: Secondary | ICD-10-CM | POA: Diagnosis not present

## 2013-01-14 DIAGNOSIS — Z951 Presence of aortocoronary bypass graft: Secondary | ICD-10-CM | POA: Diagnosis not present

## 2013-01-14 DIAGNOSIS — K5289 Other specified noninfective gastroenteritis and colitis: Secondary | ICD-10-CM | POA: Diagnosis not present

## 2013-01-14 LAB — GLUCOSE, CAPILLARY
GLUCOSE-CAPILLARY: 159 mg/dL — AB (ref 70–99)
GLUCOSE-CAPILLARY: 130 mg/dL — AB (ref 70–99)

## 2013-01-14 MED ORDER — SODIUM CHLORIDE 0.9 % IV SOLN: 500.0000 mL | INTRAVENOUS | Status: DC

## 2013-01-14 NOTE — Progress Notes (Signed)
Lidocaine-40mg IV prior to Propofol InductionPropofol given over incremental dosages 

## 2013-01-14 NOTE — Op Note (Signed)
[facility]  [address] [place], [address]   COLONOSCOPY PROCEDURE REPORT  PATIENT: Andres Smith, Andres Smith  MR#: [id number] BIRTHDATE: [birth year] , 72  yrs. old GENDER: Male ENDOSCOPIST: Sable Feil, MD, [facility] REFERRED BY: PROCEDURE DATE:  01/14/2013 PROCEDURE:   Colonoscopy with biopsy First Screening Colonoscopy - Avg.  risk and is 50 yrs.  old or older - No.  Prior Negative Screening - Now for repeat screening. N/A  History of Adenoma - Now for follow-up colonoscopy & has been > or = to 3 yrs.  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 MEDICATIONS: propofol (Diprivan) 200mg  IV  DESCRIPTION OF PROCEDURE:   After the risks benefits and alternatives of the procedure were thoroughly explained, informed consent was obtained.  A digital rectal exam revealed no abnormalities of the rectum.   The [id number]  endoscope was introduced through the anus and advanced to the cecum, which was identified by both the appendix and ileocecal valve. No adverse events experienced.   The quality of the prep was excellent, using MoviPrep  The instrument was then slowly withdrawn as the colon was fully examined.      COLON FINDINGS: The colonoscope was inserted and passed easily into the cecum there were erosions at the base the cecum biopsied and labeled specimen #1.There were scattered erosions throughout the colon but no severe active colitis.  Mucosa of the colon was somewhat atrophic with a flat tubular-like colon.  Biopsies were obtained every 10 cm for pathologic exam.specimen jar #2 was the right colon in specimen jar #3 was left colon.  There were no polypoid lesions that require polypectomy or biopsies. Retroflexion was not performed due to a narrow rectal vault. The time to cecum=4 minutes 40 seconds.  Withdrawal time=9 minutes 30 seconds.  The scope was withdrawn and the procedure  completed. COMPLICATIONS: There were no complications.  ENDOSCOPIC IMPRESSION: The colonoscope was inserted and passed easily into the cecum. There were erosions at the base the cecum biopsied and labeled specimen #1. There were scattered erosions throughout the colon but no severe active colitis.  Mucosa of the colon was somewhat atrophic with a flat tubular-like colon.  Biopsies were obtained every 10 cm for pathologic exam.specimen jar #2 was the right colon in specimen jar #3 was left colon.  There were no polypoid lesions that require polypectomy or biopsies.  RECOMMENDATIONS: 1.  Await biopsy results 2.  Continue current medications 3.  Repeat Colonoscopy in 3 years.   eSigned:  Sable Feil, MD, [facility] 01/14/2013 11:44 AM   cc:   PATIENT NAME:  Andres Smith, Andres Smith MR#: [id number]

## 2013-01-14 NOTE — Patient Instructions (Signed)
Recommendations:  Wait for biopsy results, Repeat colonoscopy in 3 years.  YOU HAD AN ENDOSCOPIC PROCEDURE TODAY AT [facility]: Refer to the procedure report that was given to you for any specific questions about what was found during the examination.  If the procedure report does not answer your questions, please call your gastroenterologist to clarify.  If you requested that your care partner not be given the details of your procedure findings, then the procedure report has been included in a sealed envelope for you to review at your convenience later.  YOU SHOULD EXPECT: Some feelings of bloating in the abdomen. Passage of more gas than usual.  Walking can help get rid of the air that was put into your GI tract during the procedure and reduce the bloating. If you had a lower endoscopy (such as a colonoscopy or flexible sigmoidoscopy) you may notice spotting of blood in your stool or on the toilet paper. If you underwent a bowel prep for your procedure, then you may not have a normal bowel movement for a few days.  DIET: Your first meal following the procedure should be a light meal and then it is ok to progress to your normal diet.  A half-sandwich or bowl of soup is an example of a good first meal.  Heavy or fried foods are harder to digest and may make you feel nauseous or bloated.  Likewise meals heavy in dairy and vegetables can cause extra gas to form and this can also increase the bloating.  Drink plenty of fluids but you should avoid alcoholic beverages for 24 hours.  ACTIVITY: Your care partner should take you home directly after the procedure.  You should plan to take it easy, moving slowly for the rest of the day.  You can resume normal activity the day after the procedure however you should NOT DRIVE or use heavy machinery for 24 hours (because of the sedation medicines used during the test).    SYMPTOMS TO REPORT IMMEDIATELY: A gastroenterologist can be reached at any  hour.  During normal business hours, 8:30 AM to 5:00 PM Monday through Friday, call [phone].  After hours and on weekends, please call the GI answering service at [phone] who will take a message and have the physician on call contact you.   Following lower endoscopy (colonoscopy or flexible sigmoidoscopy):  Excessive amounts of blood in the stool  Significant tenderness or worsening of abdominal pains  Swelling of the abdomen that is new, acute  Fever of 100F or higher  Following upper endoscopy (EGD)  Vomiting of blood or coffee ground material  New chest pain or pain under the shoulder blades  Painful or persistently difficult swallowing  New shortness of breath  Fever of 100F or higher  Black, tarry-looking stools  FOLLOW UP: If any biopsies were taken you will be contacted by phone or by letter within the next 1-3 weeks.  Call your gastroenterologist if you have not heard about the biopsies in 3 weeks.  Our staff will call the home number listed on your records the next business day following your procedure to check on you and address any questions or concerns that you may have at that time regarding the information given to you following your procedure. This is a courtesy call and so if there is no answer at the home number and we have not heard from you through the emergency physician on call, we will assume that you have returned to  your regular daily activities without incident.  SIGNATURES/CONFIDENTIALITY: You and/or your care partner have signed paperwork which will be entered into your electronic medical record.  These signatures attest to the fact that that the information above on your After Visit Summary has been reviewed and is understood.  Full responsibility of the confidentiality of this discharge information lies with you and/or your care-partner.  Please follow all discharge instructions given to you by the recovery room nurse. If you have any questions or  problems after discharge please call one of the numbers listed above. You will receive a phone call in the am to see how you are doing and answer any questions you may have. Thank you for choosing [facility] for your health care needs.

## 2013-01-15 ENCOUNTER — Telehealth: Payer: Self-pay | Admitting: *Deleted

## 2013-01-15 NOTE — Telephone Encounter (Signed)
  Follow up Call-  Call back number 01/14/2013  Post procedure Call Back phone  # cell [phone]  Permission to leave phone message Yes     Patient questions:  Do you have a fever, pain , or abdominal swelling? no Pain Score  0 *  Have you tolerated food without any problems? yes  Have you been able to return to your normal activities? yes  Do you have any questions about your discharge instructions: Diet   no Medications  no Follow up visit  no  Do you have questions or concerns about your Care? no  Actions: * If pain score is 4 or above: No action needed, pain <4.

## 2013-01-16 DIAGNOSIS — M6530 Trigger finger, unspecified finger: Secondary | ICD-10-CM | POA: Diagnosis not present

## 2013-01-19 ENCOUNTER — Encounter: Payer: Self-pay | Admitting: Gastroenterology

## 2013-01-19 ENCOUNTER — Encounter: Payer: Medicare Other | Admitting: Gastroenterology

## 2013-01-21 ENCOUNTER — Telehealth: Payer: Self-pay | Admitting: Gastroenterology

## 2013-01-21 NOTE — Telephone Encounter (Signed)
Called patient and patient advised that Caren Griffins was suppose to call Dr. Linda Hedges to schedule B12 appointments then call him back. I advised patient that I will call Dr. Linda Hedges office and then call him back. Spoke with Izora Gala and advised patient needs a B12 appointment-----B 12 appointment was made for tomorrow at 215 pm.  I advised Izora Gala in scheduling and Dr Daylene Posey CMA that patient needs  B12 104mcg injection weekly x 3, then monthly for a year and repeat B12 level.  I called patient and advised him of his appointment and how often he needs his B12 injections Patient verbalized understanding, will be there tomorrow

## 2013-01-22 ENCOUNTER — Ambulatory Visit: Payer: Medicare Other

## 2013-01-27 ENCOUNTER — Telehealth: Payer: Self-pay

## 2013-01-27 ENCOUNTER — Ambulatory Visit (INDEPENDENT_AMBULATORY_CARE_PROVIDER_SITE_OTHER): Payer: Medicare Other

## 2013-01-27 DIAGNOSIS — E538 Deficiency of other specified B group vitamins: Secondary | ICD-10-CM | POA: Diagnosis not present

## 2013-01-27 MED ORDER — CYANOCOBALAMIN 1000 MCG/ML IJ SOLN
1000.0000 ug | Freq: Once | INTRAMUSCULAR | Status: AC
  Administered 2013-01-27: 1000 ug via INTRAMUSCULAR

## 2013-01-27 NOTE — Telephone Encounter (Signed)
Patient advised to come in for first b12 injection today. He states that this is something that he would like to to have done at home instead of nurse visit. Please advise if ok to send in Rx for b12 along with needed supplies. Thanks

## 2013-01-27 NOTE — Telephone Encounter (Signed)
If he can self-administer injection or if someone at home can do this it is ok to send in Rx for B12 1,000 mcg monthly and needed supplies.   Since he is not home bound he would not qualify for home health.

## 2013-01-28 MED ORDER — CYANOCOBALAMIN 1000 MCG/ML IJ SOLN: 1000.0000 ug | Freq: Once | INTRAMUSCULAR | Status: DC

## 2013-01-28 MED ORDER — "SYRINGE/NEEDLE (DISP) 25G X 5/8"" 3 ML MISC": Status: DC

## 2013-01-28 NOTE — Telephone Encounter (Signed)
Patient notified and RX called in.

## 2013-02-02 ENCOUNTER — Telehealth: Payer: Self-pay | Admitting: Gastroenterology

## 2013-02-02 ENCOUNTER — Other Ambulatory Visit: Payer: Self-pay | Admitting: *Deleted

## 2013-02-02 MED ORDER — BALSALAZIDE DISODIUM 1.1 G PO TABS: 1.0000 | ORAL_TABLET | Freq: Two times a day (BID) | ORAL | Status: DC

## 2013-02-02 NOTE — Telephone Encounter (Signed)
RX sent

## 2013-02-03 ENCOUNTER — Telehealth: Payer: Self-pay | Admitting: *Deleted

## 2013-02-03 ENCOUNTER — Ambulatory Visit (INDEPENDENT_AMBULATORY_CARE_PROVIDER_SITE_OTHER): Payer: Medicare Other | Admitting: *Deleted

## 2013-02-03 DIAGNOSIS — E538 Deficiency of other specified B group vitamins: Secondary | ICD-10-CM

## 2013-02-03 MED ORDER — CYANOCOBALAMIN 1000 MCG/ML IJ SOLN
1000.0000 ug | Freq: Once | INTRAMUSCULAR | Status: AC
  Administered 2013-02-03: 1000 ug via INTRAMUSCULAR

## 2013-02-03 MED ORDER — MESALAMINE ER 0.375 G PO CP24: ORAL_CAPSULE | ORAL | Status: DC

## 2013-02-03 NOTE — Telephone Encounter (Signed)
Andres Smith is not covered by patient's insurance Per Dr. [name] ok to send Apriso--4 capsules by mouth once daily   I called patient and he stated that he was told to stop Lialda and try Andres Smith but he can try Apriso. Just needs something cheaper then Lialda. Patient requested monthly supply to [facility] and then RX to [facility] pharmacy  [facility]

## 2013-02-18 DIAGNOSIS — H25019 Cortical age-related cataract, unspecified eye: Secondary | ICD-10-CM | POA: Diagnosis not present

## 2013-02-18 DIAGNOSIS — H40059 Ocular hypertension, unspecified eye: Secondary | ICD-10-CM | POA: Diagnosis not present

## 2013-02-18 DIAGNOSIS — E119 Type 2 diabetes mellitus without complications: Secondary | ICD-10-CM | POA: Diagnosis not present

## 2013-02-18 DIAGNOSIS — H25049 Posterior subcapsular polar age-related cataract, unspecified eye: Secondary | ICD-10-CM | POA: Diagnosis not present

## 2013-02-23 DIAGNOSIS — M6530 Trigger finger, unspecified finger: Secondary | ICD-10-CM | POA: Diagnosis not present

## 2013-03-06 DIAGNOSIS — M25649 Stiffness of unspecified hand, not elsewhere classified: Secondary | ICD-10-CM | POA: Diagnosis not present

## 2013-03-10 DIAGNOSIS — M25649 Stiffness of unspecified hand, not elsewhere classified: Secondary | ICD-10-CM | POA: Diagnosis not present

## 2013-03-11 ENCOUNTER — Encounter: Payer: Self-pay | Admitting: Internal Medicine

## 2013-04-01 ENCOUNTER — Other Ambulatory Visit: Payer: Self-pay

## 2013-04-01 ENCOUNTER — Other Ambulatory Visit: Payer: Self-pay | Admitting: Cardiovascular Disease

## 2013-04-01 MED ORDER — NITROGLYCERIN 0.4 MG SL SUBL: 0.4000 mg | SUBLINGUAL_TABLET | SUBLINGUAL | Status: DC | PRN

## 2013-04-24 DIAGNOSIS — T148 Other injury of unspecified body region: Secondary | ICD-10-CM | POA: Diagnosis not present

## 2013-04-24 DIAGNOSIS — L570 Actinic keratosis: Secondary | ICD-10-CM | POA: Diagnosis not present

## 2013-04-24 DIAGNOSIS — Z85828 Personal history of other malignant neoplasm of skin: Secondary | ICD-10-CM | POA: Diagnosis not present

## 2013-04-24 DIAGNOSIS — W57XXXA Bitten or stung by nonvenomous insect and other nonvenomous arthropods, initial encounter: Secondary | ICD-10-CM | POA: Diagnosis not present

## 2013-04-24 DIAGNOSIS — L821 Other seborrheic keratosis: Secondary | ICD-10-CM | POA: Diagnosis not present

## 2013-04-30 ENCOUNTER — Other Ambulatory Visit: Payer: Self-pay | Admitting: Gastroenterology

## 2013-04-30 NOTE — Telephone Encounter (Signed)
Patient had office visit with Dr. [name] [phone] Is it okay to refill 24mp?

## 2013-05-01 NOTE — Telephone Encounter (Signed)
Yes, may refill for rest of year- he needs to establish with  Another Gi MD  Before end of the year

## 2013-06-02 ENCOUNTER — Ambulatory Visit: Payer: Medicare Other | Admitting: Internal Medicine

## 2013-06-04 DIAGNOSIS — L570 Actinic keratosis: Secondary | ICD-10-CM | POA: Diagnosis not present

## 2013-06-04 DIAGNOSIS — L819 Disorder of pigmentation, unspecified: Secondary | ICD-10-CM | POA: Diagnosis not present

## 2013-06-04 DIAGNOSIS — Z85828 Personal history of other malignant neoplasm of skin: Secondary | ICD-10-CM | POA: Diagnosis not present

## 2013-06-04 DIAGNOSIS — L821 Other seborrheic keratosis: Secondary | ICD-10-CM | POA: Diagnosis not present

## 2013-06-04 DIAGNOSIS — D1801 Hemangioma of skin and subcutaneous tissue: Secondary | ICD-10-CM | POA: Diagnosis not present

## 2013-06-04 DIAGNOSIS — D239 Other benign neoplasm of skin, unspecified: Secondary | ICD-10-CM | POA: Diagnosis not present

## 2013-07-09 ENCOUNTER — Other Ambulatory Visit: Payer: Self-pay | Admitting: Cardiovascular Disease

## 2013-08-03 ENCOUNTER — Ambulatory Visit (INDEPENDENT_AMBULATORY_CARE_PROVIDER_SITE_OTHER): Payer: Medicare Other | Admitting: Physician Assistant

## 2013-08-03 ENCOUNTER — Ambulatory Visit (INDEPENDENT_AMBULATORY_CARE_PROVIDER_SITE_OTHER): Payer: Medicare Other | Admitting: Internal Medicine

## 2013-08-03 ENCOUNTER — Encounter: Payer: Self-pay | Admitting: Internal Medicine

## 2013-08-03 ENCOUNTER — Encounter: Payer: Self-pay | Admitting: Physician Assistant

## 2013-08-03 ENCOUNTER — Other Ambulatory Visit (INDEPENDENT_AMBULATORY_CARE_PROVIDER_SITE_OTHER): Payer: Medicare Other

## 2013-08-03 VITALS — BP 110/64 | HR 48 | Temp 98.7°F | Resp 16 | Ht 67.0 in | Wt 203.1 lb

## 2013-08-03 VITALS — BP 110/60 | HR 48 | Ht 68.0 in | Wt 202.0 lb

## 2013-08-03 DIAGNOSIS — E785 Hyperlipidemia, unspecified: Secondary | ICD-10-CM

## 2013-08-03 DIAGNOSIS — K2100 Gastro-esophageal reflux disease with esophagitis, without bleeding: Secondary | ICD-10-CM

## 2013-08-03 DIAGNOSIS — D519 Vitamin B12 deficiency anemia, unspecified: Secondary | ICD-10-CM | POA: Insufficient documentation

## 2013-08-03 DIAGNOSIS — K5010 Crohn's disease of large intestine without complications: Secondary | ICD-10-CM

## 2013-08-03 DIAGNOSIS — D539 Nutritional anemia, unspecified: Secondary | ICD-10-CM

## 2013-08-03 DIAGNOSIS — Z951 Presence of aortocoronary bypass graft: Secondary | ICD-10-CM

## 2013-08-03 DIAGNOSIS — Z23 Encounter for immunization: Secondary | ICD-10-CM

## 2013-08-03 DIAGNOSIS — I2510 Atherosclerotic heart disease of native coronary artery without angina pectoris: Secondary | ICD-10-CM

## 2013-08-03 DIAGNOSIS — E119 Type 2 diabetes mellitus without complications: Secondary | ICD-10-CM | POA: Diagnosis not present

## 2013-08-03 DIAGNOSIS — I6529 Occlusion and stenosis of unspecified carotid artery: Secondary | ICD-10-CM

## 2013-08-03 DIAGNOSIS — I10 Essential (primary) hypertension: Secondary | ICD-10-CM | POA: Diagnosis not present

## 2013-08-03 DIAGNOSIS — K5190 Ulcerative colitis, unspecified, without complications: Secondary | ICD-10-CM

## 2013-08-03 DIAGNOSIS — M459 Ankylosing spondylitis of unspecified sites in spine: Secondary | ICD-10-CM

## 2013-08-03 LAB — TSH: TSH: 2.77 u[IU]/mL (ref 0.35–4.50)

## 2013-08-03 LAB — CBC WITH DIFFERENTIAL/PLATELET
BASOS PCT: 0.4 % (ref 0.0–3.0)
Basophils Absolute: 0 10*3/uL (ref 0.0–0.1)
EOS PCT: 1.9 % (ref 0.0–5.0)
Eosinophils Absolute: 0.1 10*3/uL (ref 0.0–0.7)
HCT: 38.5 % — ABNORMAL LOW (ref 39.0–52.0)
Hemoglobin: 13.1 g/dL (ref 13.0–17.0)
LYMPHS PCT: 21.3 % (ref 12.0–46.0)
Lymphs Abs: 1 10*3/uL (ref 0.7–4.0)
MCHC: 34.1 g/dL (ref 30.0–36.0)
MCV: 106.2 fl — ABNORMAL HIGH (ref 78.0–100.0)
MONOS PCT: 10.6 % (ref 3.0–12.0)
Monocytes Absolute: 0.5 10*3/uL (ref 0.1–1.0)
NEUTROS ABS: 3 10*3/uL (ref 1.4–7.7)
NEUTROS PCT: 65.8 % (ref 43.0–77.0)
Platelets: 148 10*3/uL — ABNORMAL LOW (ref 150.0–400.0)
RBC: 3.63 Mil/uL — AB (ref 4.22–5.81)
RDW: 15.5 % (ref 11.5–15.5)
WBC: 4.5 10*3/uL (ref 4.0–10.5)

## 2013-08-03 LAB — COMPREHENSIVE METABOLIC PANEL
ALBUMIN: 3.8 g/dL (ref 3.5–5.2)
ALT: 12 U/L (ref 0–53)
AST: 18 U/L (ref 0–37)
Alkaline Phosphatase: 42 U/L (ref 39–117)
BUN: 14 mg/dL (ref 6–23)
CALCIUM: 8.6 mg/dL (ref 8.4–10.5)
CHLORIDE: 103 meq/L (ref 96–112)
CO2: 27 mEq/L (ref 19–32)
Creatinine, Ser: 0.9 mg/dL (ref 0.4–1.5)
GFR: 90 mL/min (ref >60.00)
GLUCOSE: 136 mg/dL — AB (ref 70–99)
POTASSIUM: 4.1 meq/L (ref 3.5–5.1)
Sodium: 137 mEq/L (ref 135–145)
Total Bilirubin: 1 mg/dL (ref 0.2–1.2)
Total Protein: 6.6 g/dL (ref 6.0–8.3)

## 2013-08-03 LAB — HEMOGLOBIN A1C: Hgb A1c MFr Bld: 6.9 % — ABNORMAL HIGH (ref 4.6–6.5)

## 2013-08-03 LAB — LIPID PANEL
CHOL/HDL RATIO: 2
Cholesterol: 122 mg/dL (ref 0–200)
HDL: 61.5 mg/dL (ref >39.00)
LDL Cholesterol: 48 mg/dL (ref 0–99)
NonHDL: 60.5
TRIGLYCERIDES: 63 mg/dL (ref 0.0–149.0)
VLDL: 12.6 mg/dL (ref 0.0–40.0)

## 2013-08-03 MED ORDER — ATORVASTATIN CALCIUM 80 MG PO TABS: 80.0000 mg | ORAL_TABLET | Freq: Every day | ORAL | Status: DC

## 2013-08-03 MED ORDER — CARVEDILOL 6.25 MG PO TABS: 6.2500 mg | ORAL_TABLET | Freq: Two times a day (BID) | ORAL | Status: DC

## 2013-08-03 MED ORDER — LOSARTAN POTASSIUM 100 MG PO TABS: ORAL_TABLET | ORAL | Status: DC

## 2013-08-03 MED ORDER — ISOSORBIDE MONONITRATE ER 30 MG PO TB24: 30.0000 mg | ORAL_TABLET | Freq: Every day | ORAL | Status: DC

## 2013-08-03 NOTE — Assessment & Plan Note (Signed)
His BP is well controlled I will monitor his lytes and renal function today 

## 2013-08-03 NOTE — Addendum Note (Signed)
Addended by: Janith Lima on: 08/03/2013 03:06 PM   Modules accepted: Orders

## 2013-08-03 NOTE — Assessment & Plan Note (Signed)
He is doing well on lipitor Will recheck his FLP today

## 2013-08-03 NOTE — Progress Notes (Signed)
Pre visit review using our clinic review tool, if applicable. No additional management support is needed unless otherwise documented below in the visit note. 

## 2013-08-03 NOTE — Patient Instructions (Signed)
Continue current medications.  Schedule follow up with Dr. Sherren Mocha in 6 mos.

## 2013-08-03 NOTE — Progress Notes (Signed)
Subjective:    Patient ID: Andres Smith, male    DOB: 1940/12/04, 73 y.o.   MRN: [id number]  Diabetes He presents for his follow-up diabetic visit. He has type 2 diabetes mellitus. The initial diagnosis of diabetes was made 10 years ago. His disease course has been fluctuating. Pertinent negatives for hypoglycemia include no dizziness, headaches, seizures or tremors. Pertinent negatives for diabetes include no blurred vision, no chest pain, no fatigue, no foot paresthesias, no foot ulcerations, no polydipsia, no polyphagia, no polyuria, no visual change, no weakness and no weight loss. Symptoms are stable. Diabetic complications include heart disease. Current diabetic treatment includes oral agent (monotherapy). He is compliant with treatment all of the time. His weight is decreasing steadily. He is following a generally healthy diet. Meal planning includes avoidance of concentrated sweets. He has not had a previous visit with a dietician. He participates in exercise intermittently. There is no change in his home blood glucose trend. An ACE inhibitor/angiotensin II receptor blocker is being taken. He does not see a podiatrist.Eye exam is current.      Review of Systems  Constitutional: Negative.  Negative for fever, chills, weight loss, diaphoresis, appetite change and fatigue.  HENT: Negative.   Eyes: Negative.  Negative for blurred vision.  Respiratory: Negative.  Negative for cough, choking, chest tightness, shortness of breath and stridor.   Cardiovascular: Negative.  Negative for chest pain, palpitations and leg swelling.  Gastrointestinal: Negative.  Negative for nausea, vomiting, abdominal pain, diarrhea and constipation.  Endocrine: Negative.  Negative for polydipsia, polyphagia and polyuria.  Genitourinary: Negative.   Musculoskeletal: Negative.  Negative for arthralgias, back pain, joint swelling, myalgias and neck pain.  Skin: Negative.  Negative for rash.  Allergic/Immunologic:  Negative.   Neurological: Negative.  Negative for dizziness, tremors, seizures, weakness, light-headedness, numbness and headaches.  Hematological: Negative.  Negative for adenopathy. Does not bruise/bleed easily.  Psychiatric/Behavioral: Negative.        Objective:   Physical Exam  Vitals reviewed. Constitutional: He is oriented to person, place, and time. He appears well-developed and well-nourished. No distress.  HENT:  Head: Normocephalic and atraumatic.  Mouth/Throat: Oropharynx is clear and moist. No oropharyngeal exudate.  Eyes: Conjunctivae are normal. Right eye exhibits no discharge. Left eye exhibits no discharge. No scleral icterus.  Neck: Normal range of motion. Neck supple. No JVD present. No tracheal deviation present. No thyromegaly present.  Cardiovascular: Normal rate, regular rhythm, normal heart sounds and intact distal pulses.  Exam reveals no gallop and no friction rub.   No murmur heard. Pulmonary/Chest: Effort normal and breath sounds normal. No stridor. No respiratory distress. He has no wheezes. He has no rales. He exhibits no tenderness.  Abdominal: Soft. Bowel sounds are normal. He exhibits no distension and no mass. There is no tenderness. There is no rebound and no guarding.  Musculoskeletal: Normal range of motion. He exhibits no edema and no tenderness.  Lymphadenopathy:    He has no cervical adenopathy.  Neurological: He is oriented to person, place, and time.  Skin: Skin is warm and dry. No rash noted. He is not diaphoretic. No erythema. No pallor.  Psychiatric: He has a normal mood and affect. His behavior is normal. Judgment and thought content normal.     Lab Results  Component Value Date   WBC 5.3 01/06/2013   HGB 13.6 01/06/2013   HCT 40.3 01/06/2013   PLT 158.0 01/06/2013   GLUCOSE 137* 01/06/2013   CHOL 157 08/18/2012  TRIG 68.0 08/18/2012   HDL 61.70 08/18/2012   LDLCALC 82 08/18/2012   ALT 28 01/06/2013   AST 32 01/06/2013   NA 138 01/06/2013   K 4.7  01/06/2013   CL 102 01/06/2013   CREATININE 1.0 01/06/2013   BUN 15 01/06/2013   CO2 29 01/06/2013   TSH 3.71 01/06/2013   PSA 2.29 05/06/2012   INR 0.9 ratio 12/02/2008   HGBA1C 7.4* 11/18/2012       Assessment & Plan:

## 2013-08-03 NOTE — Progress Notes (Signed)
Cardiology Office Note    Date:  08/03/2013   ID:  Andres Smith, DOB 04/26/1940, MRN [id number]  PCP:  [name], MD  Cardiologist:  Dr. Sherren Mocha      History of Present Illness: Andres Smith is a 73 y.o. male with a hx of CAD, s/p CABG, HTN, HL, DM2, inflammatory bowel disease, ankylosing spondylitis.  Last cath in 05/2011 with patent grafts but severe stenosis in the small diagonal beyond the LIMA insertion.  Medical Rx recommended.  Last seen by Dr. Burt Smith in 09/2012.  He returns for f/u.  Here by himself.  Still has occ chest pain.  Occurs at rest.  May happen 3-4 x a year.  No exertional symptoms.  Plays golf and walks some holes.  [place]  Still mows his yard.  No significant dyspnea.  NYHA 2-2b.  No orthopnea, PND, sig edema.  No syncope.  Notes dizziness with head position changes.     Studies:  - LHC (5/13):  Distal left main 40%, mid LAD occluded, ostial D2 70-80%, proximal D2 distal to LIMA touchdown 90%, proximal circumflex occluded, proximal RCA 99%, distal RCA occluded, mid PDA beyond SVG touchdown 60-70%,EF 50%; LIMA-D2/LAD patent, SVG-ramus/OM1/OM2 patent, SVG-PDA and PLV patent >>> med rx  - Echo (5/14):  Mild LVH, EF 55-60%, normal wall motion, mild LAE  - Carotid US (11/14):  Bilateral ICA 1-39%-followup 1 year   Recent Labs/Images: 08/03/2013: ALT 12; Creatinine 0.9; HDL Cholesterol by NMR 61.50; Hemoglobin 13.1; LDL (calc) 48; Potassium 4.1; TSH 2.77    Wt Readings from Last 3 Encounters:  08/03/13 202 lb (91.627 kg)  08/03/13 203 lb 2 oz (92.137 kg)  01/14/13 209 lb (94.802 kg)     Past Medical History  Diagnosis Date  . Hypertension   . Hyperlipidemia   . Diabetes mellitus   . Bradycardia   . Crohn's colitis 12/01/1997  . Neck pain   . Dysphagia   . Ankylosing spondylitis   . Coronary artery disease     a. s/p CABG x 6 in 1997 (LIMA->LAD, VG->RI ->OM1->OM2, VG->PDA->PLV;  b. 05/2011 Cath:  patent grafs, native prox rca  and d2 dzs  ->med rx.  . Pneumonia     hx of PNA  . Blood transfusion     hx of transfusion without reaction  . Kidney stones   . GERD (gastroesophageal reflux disease)   . History of skin cancer   . Heart block   . 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 Hiatal hernia 12/03/2008  . Hx of echocardiogram     Echo 5/14:  Mild LVH, EF 55-60%, NL diast fxn, mild LAE   . Fatty liver   . Cholelithiasis     Current Outpatient Prescriptions  Medication Sig Dispense Refill  . aspirin 81 MG tablet Take 81 mg by mouth daily.        Marland Kitchen atorvastatin (LIPITOR) 80 MG tablet Take 1 tablet (80 mg total) by mouth daily at 6 PM.  90 tablet  3  . carvedilol (COREG) 6.25 MG tablet Take 1 tablet (6.25 mg total) by mouth 2 (two) times daily with a meal.  180 tablet  3  . cyanocobalamin (,VITAMIN B-12,) 1000 MCG/ML injection Inject 1 mL (1,000 mcg total) into the muscle once. Inject 1 ml x 3 weeks, then once monthly  10 mL  5  . folic acid (FOLVITE) 1 MG tablet Take 1 mg by mouth daily.      Marland Kitchen  isosorbide mononitrate (IMDUR) 30 MG 24 hr tablet Take 1 tablet (30 mg total) by mouth daily.  90 tablet  3  . losartan (COZAAR) 100 MG tablet TAKE 1 TABLET ONCE DAILY.  90 tablet  3  . mercaptopurine (PURINETHOL) 50 MG tablet TAKE 1 TABLET DAILY ON AN EMPTY STOMACH- 1 HOUR BEFORE OR 2 HOURS AFTER A MEAL. **CAUTION: CHEMOTHERAPY**      . mesalamine (APRISO) 0.375 G 24 hr capsule PLEASE TAKE FOUR CAPSULES BY MOUTH ONCE DAILY  360 capsule  5  . metFORMIN (GLUCOPHAGE) 500 MG tablet Take 1 tablet (500 mg total) by mouth 2 (two) times daily with a meal.  60 tablet  0  . nitroGLYCERIN (NITROSTAT) 0.4 MG SL tablet Place 1 tablet (0.4 mg total) under the tongue every 5 (five) minutes as needed. For chest pain  25 tablet  3  . SYRINGE-NEEDLE, DISP, 3 ML (BD ECLIPSE SYRINGE) 25G X 5/8" 3 ML MISC Use with B12 injections. Inject once weekly x 3 weeks, then once montly  16 each  0  . [DISCONTINUED] escitalopram  (LEXAPRO) 10 MG tablet Take 10 mg by mouth daily.       . [DISCONTINUED] esomeprazole (NEXIUM) 40 MG capsule Take 40 mg by mouth as needed.         No current facility-administered medications for this visit.     Allergies:   Review of patient's allergies indicates no known allergies.   Social History:  The patient  reports that he quit smoking about 30 years ago. He uses smokeless tobacco. He reports that he does not drink alcohol or use illicit drugs.   Family History:  The patient's family history includes Arthritis in his mother; Cancer in his paternal grandmother; Crohn's disease in an other family member; Heart disease in his father, mother, sister, and another family member. There is no history of Colon cancer.   ROS:  Please see the history of present illness.   No bleeding.  Prolonged URI symptoms in winter. Resolved now.   All other systems reviewed and negative.   PHYSICAL EXAM: VS:  BP 110/60  Pulse 48  Ht 5\' 8"  (1.727 m)  Wt 202 lb (91.627 kg)  BMI 30.72 kg/m2 Well nourished, well developed, in no acute distress HEENT: normal Neck: no JVD Cardiac:  normal S1, S2; RRR; no murmur Lungs:  clear to auscultation bilaterally, no wheezing, rhonchi or rales Abd: soft, nontender, no hepatomegaly Ext: no edema Skin: warm and dry Neuro:  CNs 2-12 intact, no focal abnormalities noted  EKG:  Sinus bradycardia, HR 46, RBBB, no change from prior tracing      ASSESSMENT AND PLAN:  CAD s/p CABG:  Doing well. He denies any change in his chronic stable chest pain. Continue current therapy which includes aspirin, statin, beta blocker, nitrates.  Essential hypertension, benign:  Controlled.  Occlusion and stenosis of carotid artery without mention of cerebral infarction:   Follow up carotid US due in 11/2013.  Hyperlipidemia:   Continue statin. Recent LDL performed today optimal.   Disposition:  F/u with Dr. Sherren Mocha in 6 mos.   Signed, Versie Starks, MHS 08/03/2013  4:19 PM    [facility] [place], [place], [place]  [address] Phone: [phone]; Fax: [phone]

## 2013-08-03 NOTE — Patient Instructions (Signed)

## 2013-08-03 NOTE — Assessment & Plan Note (Signed)
His blood sugars have been well controlled I will recheck his A1C and will monitor his renal function

## 2013-08-17 ENCOUNTER — Other Ambulatory Visit: Payer: Self-pay | Admitting: Cardiovascular Disease

## 2013-08-18 ENCOUNTER — Ambulatory Visit (INDEPENDENT_AMBULATORY_CARE_PROVIDER_SITE_OTHER): Payer: Medicare Other | Admitting: Internal Medicine

## 2013-08-18 ENCOUNTER — Other Ambulatory Visit: Payer: Self-pay | Admitting: Internal Medicine

## 2013-08-18 ENCOUNTER — Encounter: Payer: Self-pay | Admitting: Internal Medicine

## 2013-08-18 VITALS — BP 110/70 | HR 64 | Ht 67.0 in | Wt 201.6 lb

## 2013-08-18 DIAGNOSIS — Z79899 Other long term (current) drug therapy: Secondary | ICD-10-CM | POA: Diagnosis not present

## 2013-08-18 DIAGNOSIS — I6529 Occlusion and stenosis of unspecified carotid artery: Secondary | ICD-10-CM | POA: Diagnosis not present

## 2013-08-18 DIAGNOSIS — K5190 Ulcerative colitis, unspecified, without complications: Secondary | ICD-10-CM

## 2013-08-18 NOTE — Progress Notes (Signed)
Patient ID: Andres Smith, male   DOB: 15-Jul-1940, 74 y.o.   MRN: [id number] HPI: Andres Smith is a 73 year old male with a long-standing history of pan ulcerative colitis and ankylosing spondylitis, CAD status post CABG, hypertension, hyperlipidemia, diabetes, GERD, cholelithiasis who is seen in followup. He is here alone today. He was followed long-term by Dr. Verl Blalock. He has been doing well from a colitis standpoint and has been in clinical remission on 6-MP and mesalamine therapy. He had colonoscopy in January 2015 performed by Dr. [name] which revealed some patchy colitis in the cecum but was otherwise unremarkable. Biopsy showed chronic colitis which was mild and inactive in certain places. No dysplasia. No polyps. He reports he is feeling well without abdominal pain. No bloody stools. No significant diarrhea. He estimates he may have diarrhea one day per month and he uses Imodium. He did well without weight loss. No dysphagia or odynophagia. No hepatobiliary complaints. He's had recent labs primary care.  Past Medical History  Diagnosis Date  . Hypertension   . Hyperlipidemia   . Diabetes mellitus   . Bradycardia   . Crohn's colitis 12/01/1997  . Neck pain   . Dysphagia   . Ankylosing spondylitis   . Coronary artery disease     a. s/p CABG x 6 in 1997 (LIMA->LAD, VG->RI ->OM1->OM2, VG->PDA->PLV;  b. 05/2011 Cath:  patent grafs, native prox rca and d2 dzs  ->med rx.  . Pneumonia     hx of PNA  . Blood transfusion     hx of transfusion without reaction  . Kidney stones   . GERD (gastroesophageal reflux disease)   . History of skin cancer   . Heart block   . 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 Hiatal hernia 12/03/2008  . Hx of echocardiogram     Echo 5/14:  Mild LVH, EF 55-60%, NL diast fxn, mild LAE   . Fatty liver   . Cholelithiasis     Past Surgical History  Procedure Laterality Date  . Coronary artery bypass graft  04/19/1995    x7  .  Umbilical hernia repair    . Trigger finger release    . Cancer removal  2014    removed from left outter leg  . Tonsillectomy and adenoidectomy  1944  . Hernia repair  1989  . Colonoscopy  2012    Outpatient Prescriptions Prior to Visit  Medication Sig Dispense Refill  . aspirin 81 MG tablet Take 81 mg by mouth daily.        Marland Kitchen atorvastatin (LIPITOR) 80 MG tablet Take 1 tablet (80 mg total) by mouth daily at 6 PM.  90 tablet  3  . carvedilol (COREG) 6.25 MG tablet Take 1 tablet (6.25 mg total) by mouth 2 (two) times daily with a meal.  180 tablet  3  . cyanocobalamin (,VITAMIN B-12,) 1000 MCG/ML injection INJECT 1ML ONCE MONTHLY.  3 mL  11  . folic acid (FOLVITE) 1 MG tablet Take 1 mg by mouth daily.      . isosorbide mononitrate (IMDUR) 30 MG 24 hr tablet Take 1 tablet (30 mg total) by mouth daily.  90 tablet  3  . losartan (COZAAR) 100 MG tablet TAKE 1 TABLET ONCE DAILY.  90 tablet  3  . mercaptopurine (PURINETHOL) 50 MG tablet TAKE 1 TABLET DAILY ON AN EMPTY STOMACH- 1 HOUR BEFORE OR 2 HOURS AFTER A MEAL. **CAUTION: CHEMOTHERAPY**      . mesalamine (APRISO) 0.375 G  24 hr capsule PLEASE TAKE FOUR CAPSULES BY MOUTH ONCE DAILY  360 capsule  5  . metFORMIN (GLUCOPHAGE) 500 MG tablet Take 1 tablet (500 mg total) by mouth 2 (two) times daily with a meal.  60 tablet  0  . nitroGLYCERIN (NITROSTAT) 0.4 MG SL tablet Place 1 tablet (0.4 mg total) under the tongue every 5 (five) minutes as needed. For chest pain  25 tablet  3  . SYRINGE-NEEDLE, DISP, 3 ML (BD ECLIPSE SYRINGE) 25G X 5/8" 3 ML MISC Use with B12 injections. Inject once weekly x 3 weeks, then once montly  16 each  0   No facility-administered medications prior to visit.    No Known Allergies  Family History  Problem Relation Age of Onset  . Heart disease Father   . Heart disease Mother   . Arthritis Mother   . Heart disease    . Crohn's disease    . Colon cancer Neg Hx   . Heart disease Sister   . Cancer Paternal  Grandmother     colon    History  Substance Use Topics  . Smoking status: Former Smoker    Quit date: 01/02/1983  . Smokeless tobacco: Current User  . Alcohol Use: No     Comment: daily    ROS: As per history of present illness, otherwise negative  BP 110/70  Pulse 64  Ht 5\' 7"  (1.702 m)  Wt 201 lb 9.6 oz (91.445 kg)  BMI 31.57 kg/m2 Constitutional: Well-developed and well-nourished. No distress. HEENT: Normocephalic and atraumatic. Oropharynx is clear and moist. No oropharyngeal exudate. Conjunctivae are normal.  No scleral icterus. Neck: Neck supple. Trachea midline. Cardiovascular: Normal rate, regular rhythm and intact distal pulses. No M/R/G Pulmonary/chest: Effort normal and breath sounds normal. No wheezing, rales or rhonchi. Abdominal: Soft, obese, nontender, nondistended. Bowel sounds active throughout.  Extremities: no clubbing, cyanosis, or edema Lymphadenopathy: No cervical adenopathy noted. Neurological: Alert and oriented to person place and time. Skin: Skin is warm and dry. No rashes noted. Psychiatric: Normal mood and affect. Behavior is normal.  RELEVANT LABS AND IMAGING: CBC    Component Value Date/Time   WBC 4.5 08/03/2013 1122   RBC 3.63* 08/03/2013 1122   HGB 13.1 08/03/2013 1122   HCT 38.5* 08/03/2013 1122   PLT 148.0* 08/03/2013 1122   MCV 106.2* 08/03/2013 1122   MCH 32.1 05/25/2011 1210   MCHC 34.1 08/03/2013 1122   RDW 15.5 08/03/2013 1122   LYMPHSABS 1.0 08/03/2013 1122   MONOABS 0.5 08/03/2013 1122   EOSABS 0.1 08/03/2013 1122   BASOSABS 0.0 08/03/2013 1122    CMP     Component Value Date/Time   NA 137 08/03/2013 1122   K 4.1 08/03/2013 1122   CL 103 08/03/2013 1122   CO2 27 08/03/2013 1122   GLUCOSE 136* 08/03/2013 1122   GLUCOSE 98 12/12/2005 0729   BUN 14 08/03/2013 1122   CREATININE 0.9 08/03/2013 1122   CALCIUM 8.6 08/03/2013 1122   PROT 6.6 08/03/2013 1122   ALBUMIN 3.8 08/03/2013 1122   AST 18 08/03/2013 1122   ALT 12 08/03/2013 1122   ALKPHOS 42 08/03/2013 1122    BILITOT 1.0 08/03/2013 1122   GFRNONAA 82* 05/25/2011 1210   GFRAA >90 05/25/2011 1210    ASSESSMENT/PLAN: 73 year old male with a long-standing history of pan ulcerative colitis and ankylosing spondylitis, CAD status post CABG, hypertension, hyperlipidemia, diabetes, GERD, cholelithiasis who is seen in followup.  1. Pan ulcerative colitis with history of ankylosing  spondylitis -- clinical remission has been maintained comment endoscopic disease activity very very mild. He is feeling well. Liver enzymes and white blood cell count stable on 6-MP. Continue 6-MP at current dose. We'll also continue mesalamine in the form of Apriso with 4 capsules daily.  We'll need to continue to monitor CBC and liver enzymes while on 6-MP. We'll followup in 6 months, sooner necessary. Surveillance colonoscopy recommended in 2 years from prior which would be January 2017.

## 2013-08-18 NOTE — Patient Instructions (Signed)
Continue Apriso 4 capsules by mouth daily. We have given your samples today.  Please follow up with Dr. Hilarie Fredrickson in 6 months.

## 2013-08-19 ENCOUNTER — Other Ambulatory Visit: Payer: Self-pay

## 2013-08-19 MED ORDER — CYANOCOBALAMIN 1000 MCG/ML IJ SOLN: INTRAMUSCULAR | Status: DC

## 2013-10-05 DIAGNOSIS — Z4789 Encounter for other orthopedic aftercare: Secondary | ICD-10-CM | POA: Diagnosis not present

## 2013-11-03 ENCOUNTER — Other Ambulatory Visit: Payer: Self-pay | Admitting: Dermatology

## 2013-11-03 ENCOUNTER — Other Ambulatory Visit (HOSPITAL_COMMUNITY): Payer: Self-pay | Admitting: Cardiology

## 2013-11-03 DIAGNOSIS — Z85828 Personal history of other malignant neoplasm of skin: Secondary | ICD-10-CM | POA: Diagnosis not present

## 2013-11-03 DIAGNOSIS — C44311 Basal cell carcinoma of skin of nose: Secondary | ICD-10-CM | POA: Diagnosis not present

## 2013-11-03 DIAGNOSIS — C44622 Squamous cell carcinoma of skin of right upper limb, including shoulder: Secondary | ICD-10-CM | POA: Diagnosis not present

## 2013-11-03 DIAGNOSIS — L821 Other seborrheic keratosis: Secondary | ICD-10-CM | POA: Diagnosis not present

## 2013-11-03 DIAGNOSIS — L570 Actinic keratosis: Secondary | ICD-10-CM | POA: Diagnosis not present

## 2013-11-03 DIAGNOSIS — L812 Freckles: Secondary | ICD-10-CM | POA: Diagnosis not present

## 2013-11-03 DIAGNOSIS — I6523 Occlusion and stenosis of bilateral carotid arteries: Secondary | ICD-10-CM

## 2013-11-03 DIAGNOSIS — L989 Disorder of the skin and subcutaneous tissue, unspecified: Secondary | ICD-10-CM | POA: Diagnosis not present

## 2013-11-03 DIAGNOSIS — D485 Neoplasm of uncertain behavior of skin: Secondary | ICD-10-CM | POA: Diagnosis not present

## 2013-11-12 ENCOUNTER — Ambulatory Visit (HOSPITAL_COMMUNITY): Payer: Medicare Other | Attending: Internal Medicine | Admitting: Cardiology

## 2013-11-12 DIAGNOSIS — I2510 Atherosclerotic heart disease of native coronary artery without angina pectoris: Secondary | ICD-10-CM | POA: Insufficient documentation

## 2013-11-12 DIAGNOSIS — E119 Type 2 diabetes mellitus without complications: Secondary | ICD-10-CM | POA: Insufficient documentation

## 2013-11-12 DIAGNOSIS — Z951 Presence of aortocoronary bypass graft: Secondary | ICD-10-CM | POA: Diagnosis not present

## 2013-11-12 DIAGNOSIS — E785 Hyperlipidemia, unspecified: Secondary | ICD-10-CM | POA: Insufficient documentation

## 2013-11-12 DIAGNOSIS — Z23 Encounter for immunization: Secondary | ICD-10-CM | POA: Diagnosis not present

## 2013-11-12 DIAGNOSIS — R42 Dizziness and giddiness: Secondary | ICD-10-CM | POA: Diagnosis not present

## 2013-11-12 DIAGNOSIS — I10 Essential (primary) hypertension: Secondary | ICD-10-CM | POA: Insufficient documentation

## 2013-11-12 DIAGNOSIS — I6523 Occlusion and stenosis of bilateral carotid arteries: Secondary | ICD-10-CM | POA: Diagnosis not present

## 2013-11-12 DIAGNOSIS — Z87891 Personal history of nicotine dependence: Secondary | ICD-10-CM | POA: Diagnosis not present

## 2013-11-12 NOTE — Progress Notes (Signed)
Carotid duplex performed 

## 2013-11-23 ENCOUNTER — Encounter: Payer: Self-pay | Admitting: Cardiovascular Disease

## 2013-11-23 NOTE — Telephone Encounter (Signed)
This encounter was created in error - please disregard.

## 2013-11-23 NOTE — Telephone Encounter (Signed)
New Msg  Patient returning call about test please contact at [phone]

## 2013-11-30 DIAGNOSIS — C44321 Squamous cell carcinoma of skin of nose: Secondary | ICD-10-CM | POA: Diagnosis not present

## 2013-11-30 DIAGNOSIS — Z85828 Personal history of other malignant neoplasm of skin: Secondary | ICD-10-CM | POA: Diagnosis not present

## 2013-12-10 ENCOUNTER — Encounter: Payer: Self-pay | Admitting: Internal Medicine

## 2013-12-10 ENCOUNTER — Encounter (HOSPITAL_COMMUNITY): Payer: Self-pay | Admitting: Cardiology

## 2014-02-05 ENCOUNTER — Telehealth: Payer: Self-pay | Admitting: Internal Medicine

## 2014-02-05 MED ORDER — MERCAPTOPURINE 50 MG PO TABS: ORAL_TABLET | ORAL | Status: DC

## 2014-02-05 NOTE — Telephone Encounter (Signed)
Rx sent 

## 2014-02-17 ENCOUNTER — Encounter: Payer: Self-pay | Admitting: Internal Medicine

## 2014-02-17 ENCOUNTER — Other Ambulatory Visit (INDEPENDENT_AMBULATORY_CARE_PROVIDER_SITE_OTHER): Payer: Medicare Other

## 2014-02-17 ENCOUNTER — Ambulatory Visit (INDEPENDENT_AMBULATORY_CARE_PROVIDER_SITE_OTHER): Payer: Medicare Other | Admitting: Internal Medicine

## 2014-02-17 VITALS — BP 124/68 | HR 56 | Resp 14 | Ht 68.0 in | Wt 211.6 lb

## 2014-02-17 DIAGNOSIS — K5190 Ulcerative colitis, unspecified, without complications: Secondary | ICD-10-CM | POA: Diagnosis not present

## 2014-02-17 DIAGNOSIS — E538 Deficiency of other specified B group vitamins: Secondary | ICD-10-CM

## 2014-02-17 DIAGNOSIS — M459 Ankylosing spondylitis of unspecified sites in spine: Secondary | ICD-10-CM | POA: Diagnosis not present

## 2014-02-17 DIAGNOSIS — Z79899 Other long term (current) drug therapy: Secondary | ICD-10-CM

## 2014-02-17 LAB — COMPREHENSIVE METABOLIC PANEL
ALBUMIN: 4.2 g/dL (ref 3.5–5.2)
ALK PHOS: 46 U/L (ref 39–117)
ALT: 9 U/L (ref 0–53)
AST: 13 U/L (ref 0–37)
BILIRUBIN TOTAL: 0.8 mg/dL (ref 0.2–1.2)
BUN: 17 mg/dL (ref 6–23)
CO2: 30 mEq/L (ref 19–32)
Calcium: 9 mg/dL (ref 8.4–10.5)
Chloride: 105 mEq/L (ref 96–112)
Creatinine, Ser: 0.96 mg/dL (ref 0.40–1.50)
GFR: 81.28 mL/min (ref >60.00)
Glucose, Bld: 124 mg/dL — ABNORMAL HIGH (ref 70–99)
Potassium: 4.5 mEq/L (ref 3.5–5.1)
SODIUM: 139 meq/L (ref 135–145)
Total Protein: 6.8 g/dL (ref 6.0–8.3)

## 2014-02-17 LAB — CBC WITH DIFFERENTIAL/PLATELET
BASOS ABS: 0 10*3/uL (ref 0.0–0.1)
Basophils Relative: 0.7 % (ref 0.0–3.0)
Eosinophils Absolute: 0.1 10*3/uL (ref 0.0–0.7)
Eosinophils Relative: 2.1 % (ref 0.0–5.0)
HEMATOCRIT: 40.3 % (ref 39.0–52.0)
HEMOGLOBIN: 13.7 g/dL (ref 13.0–17.0)
LYMPHS ABS: 1.5 10*3/uL (ref 0.7–4.0)
LYMPHS PCT: 32.9 % (ref 12.0–46.0)
MCHC: 34.1 g/dL (ref 30.0–36.0)
MCV: 105.5 fl — ABNORMAL HIGH (ref 78.0–100.0)
MONOS PCT: 11.4 % (ref 3.0–12.0)
Monocytes Absolute: 0.5 10*3/uL (ref 0.1–1.0)
Neutro Abs: 2.5 10*3/uL (ref 1.4–7.7)
Neutrophils Relative %: 52.9 % (ref 43.0–77.0)
Platelets: 159 10*3/uL (ref 150.0–400.0)
RBC: 3.82 Mil/uL — ABNORMAL LOW (ref 4.22–5.81)
RDW: 16.2 % — AB (ref 11.5–15.5)
WBC: 4.6 10*3/uL (ref 4.0–10.5)

## 2014-02-17 MED ORDER — MESALAMINE 1.2 G PO TBEC: 2.4000 g | DELAYED_RELEASE_TABLET | Freq: Every day | ORAL | Status: DC

## 2014-02-17 NOTE — Patient Instructions (Signed)
Your physician has requested that you go to the basement for the following lab work before leaving today: CMP, CBC  Please follow up with Dr Hilarie Fredrickson in 6 months.  Please continue 15mp at current dose.  We have sent the following prescriptions to your mail in pharmacy: Lialda 2.4 grams daily  If you have not heard from your mail in pharmacy within 1 week or if you have not received your medication in the mail, please contact us at [phone] so we may find out why.  Please discontinue your Apriso.  We have given you samples of Lialda.  CC:Dr [name].

## 2014-02-17 NOTE — Progress Notes (Signed)
Subjective:    Patient ID: Andres Smith, male    DOB: 21-Feb-1940, 74 y.o.   MRN: [id number]  HPI Andres Smith is a 74 year old male with a long-standing history of pan also of colitis and ankylosing spondylitis, CAD status post CABG, hypertension, hyperlipidemia, diabetes, GERD, cholelithiasis who is seen in follow-up. He is here alone today. I last saw him in August 2015. He reports he is doing well from a colitis standpoint. He continues on 6-MP at 50 mg daily. He is also taking mesalamine in the form of Apriso 4 tablets daily.  He is tolerating this well but would like to switch back to Lialda which he was previously on. He was taking 2.4 g daily. This is a lower pill burden for him. He denies abdominal pain today. No trouble with diarrhea, rectal bleeding or melena. Occasionally he will use Imodium for loose stools but this is very isolated. Weight has been stable to him (though by 10 pounds on our scale since last visit) and he reports a good appetite with no nausea, vomiting, dysphagia or odynophagia. He does use B12 monthly by IM injection.   Review of Systems  As per history of present illness, otherwise negative  Current Medications, Allergies, Past Medical History, Past Surgical History, Family History and Social History were reviewed in [employer] record.      Objective:   Physical Exam BP 124/68 mmHg  Pulse 56  Resp 14  Ht 5\' 8"  (1.727 m)  Wt 211 lb 9.6 oz (95.981 kg)  BMI 32.18 kg/m2 Constitutional: Well-developed and well-nourished. No distress. HEENT: Normocephalic and atraumatic. Oropharynx is clear and moist. No oropharyngeal exudate. Conjunctivae are normal.  No scleral icterus. Neck: Neck supple. Trachea midline. Cardiovascular: Normal rate, regular rhythm and intact distal pulses. No M/R/G Pulmonary/chest: Effort normal and breath sounds normal. No wheezing, rales or rhonchi. Abdominal: Soft, obese, nontender, nondistended. Bowel sounds  active throughout. There are no masses palpable. No hepatosplenomegaly. Extremities: no clubbing, cyanosis, or edema Neurological: Alert and oriented to person place and time. Skin: Skin is warm and dry. No rashes noted. Psychiatric: Normal mood and affect. Behavior is normal.  CBC    Component Value Date/Time   WBC 4.5 08/03/2013 1122   RBC 3.63* 08/03/2013 1122   HGB 13.1 08/03/2013 1122   HCT 38.5* 08/03/2013 1122   PLT 148.0* 08/03/2013 1122   MCV 106.2* 08/03/2013 1122   MCH 32.1 05/25/2011 1210   MCHC 34.1 08/03/2013 1122   RDW 15.5 08/03/2013 1122   LYMPHSABS 1.0 08/03/2013 1122   MONOABS 0.5 08/03/2013 1122   EOSABS 0.1 08/03/2013 1122   BASOSABS 0.0 08/03/2013 1122    CMP     Component Value Date/Time   NA 137 08/03/2013 1122   K 4.1 08/03/2013 1122   CL 103 08/03/2013 1122   CO2 27 08/03/2013 1122   GLUCOSE 136* 08/03/2013 1122   GLUCOSE 98 12/12/2005 0729   BUN 14 08/03/2013 1122   CREATININE 0.9 08/03/2013 1122   CALCIUM 8.6 08/03/2013 1122   PROT 6.6 08/03/2013 1122   ALBUMIN 3.8 08/03/2013 1122   AST 18 08/03/2013 1122   ALT 12 08/03/2013 1122   ALKPHOS 42 08/03/2013 1122   BILITOT 1.0 08/03/2013 1122   GFRNONAA 82* 05/25/2011 1210   GFRAA >90 05/25/2011 1210      Assessment & Plan:   74 year old male with a long-standing history of pan also of colitis and ankylosing spondylitis, CAD status post CABG, hypertension,  hyperlipidemia, diabetes, GERD, cholelithiasis who is seen in follow-up.   1. 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 We'll continue 6-MP at current dose, 50 mg daily. Will change mesalamine to Lialda 2.4 g daily. If symptoms change or he develops signs of flare I asked that he notify me immediately. He voices understanding. CBC and hepatic function panel today for monitoring given 6-MP. Colonoscopy recommended January 2017. Office follow-up in 6 months.  2. History of B12 deficiency -- continue IM B12 monthly

## 2014-03-03 DIAGNOSIS — H2513 Age-related nuclear cataract, bilateral: Secondary | ICD-10-CM | POA: Diagnosis not present

## 2014-03-03 DIAGNOSIS — H25013 Cortical age-related cataract, bilateral: Secondary | ICD-10-CM | POA: Diagnosis not present

## 2014-03-03 DIAGNOSIS — E119 Type 2 diabetes mellitus without complications: Secondary | ICD-10-CM | POA: Diagnosis not present

## 2014-03-03 LAB — HM DIABETES EYE EXAM

## 2014-03-04 ENCOUNTER — Encounter: Payer: Self-pay | Admitting: Internal Medicine

## 2014-03-09 ENCOUNTER — Other Ambulatory Visit: Payer: Self-pay | Admitting: *Deleted

## 2014-03-09 MED ORDER — METFORMIN HCL 500 MG PO TABS: 500.0000 mg | ORAL_TABLET | Freq: Two times a day (BID) | ORAL | Status: DC

## 2014-03-09 NOTE — Telephone Encounter (Signed)
Wife called and stated husband is needing refills on his metformin. Needing rx to go to [facility]. Inform pt wife will send...Johny Chess

## 2014-03-29 ENCOUNTER — Encounter: Payer: Self-pay | Admitting: Cardiovascular Disease

## 2014-03-29 NOTE — Telephone Encounter (Signed)
This encounter was created in error - please disregard.

## 2014-03-29 NOTE — Telephone Encounter (Signed)
New Message       Pt's wife calling to schedule appt for pt w/ Dr. Burt Knack, notified her that Dr. Burt Knack doesn't have any open appts from now through the end of our open schedule and that I could schedule pt w/ a PA. Pt's wife states that pt was supposed to f/u w/ Dr. Burt Knack in 6 months and was told that someone would contact them to schedule an appt. Pt's wife wants for Dr. Antionette Char nurse to call the pt to schedule something with Dr. Burt Knack. Please call back and advise.

## 2014-03-30 ENCOUNTER — Ambulatory Visit (INDEPENDENT_AMBULATORY_CARE_PROVIDER_SITE_OTHER): Payer: Medicare Other | Admitting: Cardiovascular Disease

## 2014-03-30 ENCOUNTER — Encounter: Payer: Self-pay | Admitting: Cardiovascular Disease

## 2014-03-30 VITALS — BP 122/64 | HR 52 | Ht 68.0 in | Wt 209.1 lb

## 2014-03-30 DIAGNOSIS — I10 Essential (primary) hypertension: Secondary | ICD-10-CM

## 2014-03-30 DIAGNOSIS — I2510 Atherosclerotic heart disease of native coronary artery without angina pectoris: Secondary | ICD-10-CM

## 2014-03-30 MED ORDER — CARVEDILOL 3.125 MG PO TABS: 3.1250 mg | ORAL_TABLET | Freq: Two times a day (BID) | ORAL | Status: DC

## 2014-03-30 NOTE — Patient Instructions (Addendum)
Your physician has recommended you make the following change in your medication:   Decrease Carvedilol (Coreg) 3.125 mg by mouth twice daily  Your physician wants you to follow-up in: 1 year with Dr. Burt Knack. You will receive a reminder letter in the mail two months in advance. If you don't receive a letter, please call our office to schedule the follow-up appointment.  Please increase your fluid intake daily.

## 2014-03-30 NOTE — Progress Notes (Signed)
Cardiology Office Note   Date:  03/30/2014   ID:  AYAZ SONDGEROTH, DOB 17-Mar-1940, MRN [id number]  PCP:  [name], MD  Cardiologist:  Sherren Mocha, MD    Chief Complaint  Patient presents with  . Dizziness     History of Present Illness: Andres Smith is a 74 y.o. male who presents for follow-up of coronary artery disease with history of remote CABG. His last cardiac catheterization was in 2013. This demonstrated continued patency of his bypass grafts. He was noted to have native vessel disease with severe stenosis of the small diagonal beyond the LIMA insertion site. Medical therapy was recommended because of the small vessel size.  He has chronic dizziness with postural changes. This is his most bothersome symptom. He has not had near-syncope but has felt 'unstable' a few times. No frank syncope. He hasn't been physically active over the winter/spring. He walks occasionally at the [facility] - can walk for 40 minutes and reports mild 'tightness' when he first exercises then this resolves with continued walking. No change over time. He does report shortness of breath with moderate physical activity. He still works outside when he goes up to [employer].   Past Medical History  Diagnosis Date  . Hypertension   . Hyperlipidemia   . Diabetes mellitus   . Bradycardia   . Crohn's colitis 12/01/1997  . Neck pain   . Dysphagia   . Ankylosing spondylitis   . Coronary artery disease     a. s/p CABG x 6 in 1997 (LIMA->LAD, VG->RI ->OM1->OM2, VG->PDA->PLV;  b. 05/2011 Cath:  patent grafs, native prox rca and d2 dzs  ->med rx.  . Pneumonia     hx of PNA  . Blood transfusion     hx of transfusion without reaction  . Kidney stones   . GERD (gastroesophageal reflux disease)   . History of skin cancer   . Heart block   . 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 Hiatal hernia 12/03/2008  . Hx of echocardiogram     Echo 5/14:  Mild LVH, EF 55-60%, NL diast fxn, mild LAE   .  Fatty liver   . Cholelithiasis     Past Surgical History  Procedure Laterality Date  . Coronary artery bypass graft  04/19/1995    x7  . Umbilical hernia repair    . Trigger finger release    . Cancer removal  2014    removed from left outter leg  . Tonsillectomy and adenoidectomy  1944  . Hernia repair  1989  . Colonoscopy  2012  . Left heart catheterization with coronary/graft angiogram N/A 05/24/2011    Procedure: LEFT HEART CATHETERIZATION WITH Beatrix Fetters;  Surgeon: Larey Dresser, MD;  Location: [facility] CATH LAB;  Service: Cardiovascular;  Laterality: N/A;    Current Outpatient Prescriptions  Medication Sig Dispense Refill  . aspirin 81 MG tablet Take 81 mg by mouth daily.      Marland Kitchen atorvastatin (LIPITOR) 80 MG tablet Take 1 tablet (80 mg total) by mouth daily at 6 PM. 90 tablet 3  . carvedilol (COREG) 6.25 MG tablet Take 1 tablet (6.25 mg total) by mouth 2 (two) times daily with a meal. 180 tablet 3  . cyanocobalamin (,VITAMIN B-12,) 1000 MCG/ML injection INJECT 1ML ONCE MONTHLY. 10 mL 3  . folic acid (FOLVITE) 1 MG tablet Take 1 mg by mouth daily.    . isosorbide mononitrate (IMDUR) 30 MG 24 hr tablet Take 1 tablet (  30 mg total) by mouth daily. 90 tablet 3  . losartan (COZAAR) 100 MG tablet TAKE 1 TABLET ONCE DAILY. 90 tablet 3  . mercaptopurine (PURINETHOL) 50 MG tablet TAKE 1 TABLET DAILY ON AN EMPTY STOMACH- 1 HOUR BEFORE OR 2 HOURS AFTER A MEAL. **CAUTION: CHEMOTHERAPY** 90 tablet 0  . mesalamine (LIALDA) 1.2 G EC tablet Take 2 tablets (2.4 g total) by mouth daily with breakfast. 76 tablet 0  . metFORMIN (GLUCOPHAGE) 500 MG tablet Take 1 tablet (500 mg total) by mouth 2 (two) times daily with a meal. 180 tablet 1  . nitroGLYCERIN (NITROSTAT) 0.4 MG SL tablet Place 1 tablet (0.4 mg total) under the tongue every 5 (five) minutes as needed. For chest pain 25 tablet 3  . SYRINGE-NEEDLE, DISP, 3 ML (BD ECLIPSE SYRINGE) 25G X 5/8" 3 ML MISC Use with B12 injections. Inject  once weekly x 3 weeks, then once montly 16 each 0  . [DISCONTINUED] escitalopram (LEXAPRO) 10 MG tablet Take 10 mg by mouth daily.     . [DISCONTINUED] esomeprazole (NEXIUM) 40 MG capsule Take 40 mg by mouth as needed.       No current facility-administered medications for this visit.    Allergies:   Review of patient's allergies indicates no known allergies.   Social History:  The patient  reports that he quit smoking about 31 years ago. He uses smokeless tobacco. He reports that he does not drink alcohol or use illicit drugs.   Family History:  The patient's family history includes Arthritis in his mother; Cancer in his paternal grandmother; Crohn's disease in an other family member; Heart disease in his father, mother, sister, and another family member. There is no history of Colon cancer.    ROS:  Please see the history of present illness.  Otherwise, review of systems is positive for chest pain, leg swelling, dyspnea with exertion, dizziness, easy bruising, leg pain.  All other systems are reviewed and negative.    PHYSICAL EXAM: VS:  BP 122/64 mmHg  Pulse 52  Ht 5\' 8"  (1.727 m)  Wt 209 lb 1.9 oz (94.856 kg)  BMI 31.80 kg/m2 , BMI Body mass index is 31.8 kg/(m^2). GEN: Well nourished, well developed, in no acute distress HEENT: normal Neck: no JVD, no masses. No carotid bruits Cardiac: Bradycardic and regular without murmur or gallop                Respiratory:  clear to auscultation bilaterally, normal work of breathing GI: soft, nontender, obese MS: no deformity or atrophy Ext: no pretibial edema, pedal pulses 2+= bilaterally Skin: warm and dry, no rash Neuro:  Strength and sensation are intact Psych: euthymic mood, full affect  EKG:  EKG is ordered today. The ekg ordered today shows sinus bradycardia with frequent PACs. Right bundle branch block, possible inferior infarct age undetermined  Recent Labs: 08/03/2013: TSH 2.77 02/17/2014: ALT 9; BUN 17; Creatinine 0.96;  Hemoglobin 13.7; Platelets 159.0; Potassium 4.5; Sodium 139   Lipid Panel     Component Value Date/Time   CHOL 122 08/03/2013 1122   TRIG 63.0 08/03/2013 1122   TRIG 54 12/07/2005 1117   HDL 61.50 08/03/2013 1122   CHOLHDL 2 08/03/2013 1122   CHOLHDL 2.1 CALC 12/07/2005 1117   VLDL 12.6 08/03/2013 1122   LDLCALC 48 08/03/2013 1122      Wt Readings from Last 3 Encounters:  03/30/14 209 lb 1.9 oz (94.856 kg)  02/17/14 211 lb 9.6 oz (95.981 kg)  08/18/13  201 lb 9.6 oz (91.445 kg)    Cardiac Studies Reviewed: - LHC (5/13): Distal left main 40%, mid LAD occluded, ostial D2 70-80%, proximal D2 distal to LIMA touchdown 90%, proximal circumflex occluded, proximal RCA 99%, distal RCA occluded, mid PDA beyond SVG touchdown 60-70%,EF 50%; LIMA-D2/LAD patent, SVG-ramus/OM1/OM2 patent, SVG-PDA and PLV patent >>> med rx - Echo (5/14): Mild LVH, EF 55-60%, normal wall motion, mild LAE - Carotid US (11/14): Bilateral ICA 1-39%-followup 1 year  ASSESSMENT AND PLAN: 1.  CAD s/p CABG: Stable CCS Class 2 angina on current medical therapy. I have personally reviewed his last cardiac catheterization films from 2013 and agree that continued medical therapy is appropriate. His anginal symptoms have not changed in several years.  2. Essential HTN: BP well-controlled on current medical therapy. In fact, considering his dizziness and bradycardia I have recommended reducing carvedilol to 3.125 mg twice daily.  3. Carotid stenosis without hx of stroke: Duplex 11/2013 reviewed and showed < 40% stenosis bilaterally and > 50% RECA stenosis.   4. Hyperlipidemia: lipids reviewed as above, done in 08/2013. LDL 48. Recent LFT's normal. Tolerating statin drug. Discussed need for increased exercise and weight loss.  Current medicines are reviewed with the patient today.  The patient does not have concerns regarding medicines.  The following changes have been made:  no change  Labs/ tests ordered today  include:  No orders of the defined types were placed in this encounter.    Disposition:   FU one year  Signed, Sherren Mocha, MD  03/30/2014 8:42 AM    [facility] [place], [place], [place]  [address] Phone: [phone]; Fax: [phone]

## 2014-05-04 ENCOUNTER — Other Ambulatory Visit: Payer: Self-pay | Admitting: Internal Medicine

## 2014-05-06 ENCOUNTER — Other Ambulatory Visit (HOSPITAL_COMMUNITY): Payer: Self-pay | Admitting: Dermatology

## 2014-05-06 DIAGNOSIS — C44519 Basal cell carcinoma of skin of other part of trunk: Secondary | ICD-10-CM | POA: Diagnosis not present

## 2014-05-06 DIAGNOSIS — D225 Melanocytic nevi of trunk: Secondary | ICD-10-CM | POA: Diagnosis not present

## 2014-05-06 DIAGNOSIS — D485 Neoplasm of uncertain behavior of skin: Secondary | ICD-10-CM | POA: Diagnosis not present

## 2014-05-06 DIAGNOSIS — Z85828 Personal history of other malignant neoplasm of skin: Secondary | ICD-10-CM | POA: Diagnosis not present

## 2014-05-06 DIAGNOSIS — L570 Actinic keratosis: Secondary | ICD-10-CM | POA: Diagnosis not present

## 2014-05-06 DIAGNOSIS — D1801 Hemangioma of skin and subcutaneous tissue: Secondary | ICD-10-CM | POA: Diagnosis not present

## 2014-05-06 DIAGNOSIS — L821 Other seborrheic keratosis: Secondary | ICD-10-CM | POA: Diagnosis not present

## 2014-07-08 ENCOUNTER — Encounter: Payer: Self-pay | Admitting: Gastroenterology

## 2014-08-30 ENCOUNTER — Other Ambulatory Visit: Payer: Self-pay | Admitting: Internal Medicine

## 2014-09-13 ENCOUNTER — Other Ambulatory Visit: Payer: Self-pay | Admitting: Physician Assistant

## 2014-09-13 ENCOUNTER — Telehealth: Payer: Self-pay | Admitting: Cardiovascular Disease

## 2014-09-13 NOTE — Telephone Encounter (Signed)
LMTCB

## 2014-09-13 NOTE — Telephone Encounter (Signed)
New Message  Pt wife calling about pt c/o of CP- Pt was not with her at the time of the phone call but requested that a RN call him at listed #. Pt was made appt for 10/3 w/ Richardson Dopp. Please call back and discuss.

## 2014-09-14 DIAGNOSIS — L570 Actinic keratosis: Secondary | ICD-10-CM | POA: Diagnosis not present

## 2014-09-14 DIAGNOSIS — L910 Hypertrophic scar: Secondary | ICD-10-CM | POA: Diagnosis not present

## 2014-09-14 DIAGNOSIS — D485 Neoplasm of uncertain behavior of skin: Secondary | ICD-10-CM | POA: Diagnosis not present

## 2014-09-14 DIAGNOSIS — Z85828 Personal history of other malignant neoplasm of skin: Secondary | ICD-10-CM | POA: Diagnosis not present

## 2014-09-14 NOTE — Telephone Encounter (Signed)
LMTCB at number listed

## 2014-09-15 NOTE — Telephone Encounter (Signed)
I spoke with the pt and he complains of increased episodes of chest discomfort. I did find a cancellation on a schedule tomorrow so the pt will see Richardson Dopp PA-C at 9:50 AM on 09/16/14.

## 2014-09-15 NOTE — Telephone Encounter (Signed)
F/u  Pt returning RN phone call. Please call back and discuss.   

## 2014-09-15 NOTE — Progress Notes (Signed)
Cardiology Office Note   Date:  09/16/2014   ID:  Andres Smith, DOB December 03, 1940, MRN [id number]  PCP:  [name], MD  Cardiologist:  Dr. Sherren Mocha   Electrophysiologist:  n/a  Chief Complaint  Patient presents with  . Chest Pain  . Coronary Artery Disease     History of Present Illness: Andres Smith is a 74 y.o. male with a hx of CAD, s/p CABG, HTN, HL, DM2, inflammatory bowel disease, ankylosing spondylitis. Last cath in 05/2011 with patent grafts but severe stenosis in the small diagonal beyond the LIMA insertion. Medical Rx recommended. Last seen by Dr. Burt Knack 3/16. Patient noted history of chronic dizziness with postural changes. Patient continued to note stable CCS class II angina. Cardiac catheterization films were reviewed again from 2013. Medical therapy was continued. Of note, at that time, symptoms had not changed for several years. Dose of carvedilol was reduced secondary to his history of dizziness.   Patient called in recently with increasing episodes of chest discomfort. He is added on for further evaluation. As noted, he has chronic chest discomfort. This has been stable for years. Over the past few weeks, he believes that the frequency of his chest discomfort has increased some. He shared this with his wife who encouraged him to come in for follow-up. He does not always get chest discomfort with exertion. He can walk on his treadmill or around the neighborhood and feel discomfort at the beginning of exercise. However, this always goes away as long as he continues to exercise. Symptoms last only seconds. He does not take nitroglycerin. He does feel some right-sided discomfort as well as discomfort into his back. He denies associated nausea or diaphoresis. He had does take a deep breath to help the symptoms go away. He denies syncope. He denies orthopnea or PND. He has chronic pedal edema without change.  Studies: - LHC (5/13): Distal left main 40%, mid LAD occluded,  ostial D2 70-80%, proximal D2 distal to LIMA touchdown 90%, proximal circumflex occluded, proximal RCA 99%, distal RCA occluded, mid PDA beyond SVG touchdown 60-70%,EF 50%; LIMA-D2/LAD patent, SVG-ramus/OM1/OM2 patent, SVG-PDA and PLV patent >>> med rx - Echo (5/14): Mild LVH, EF 55-60%, normal wall motion, mild LAE - Carotid US (11/14): Bilateral ICA 1-39%-followup 1 year  - Carotid US (11/15):  Bilateral ICA 1-39% - FU 2 years   Past Medical History  Diagnosis Date  . Hypertension   . Hyperlipidemia   . Diabetes mellitus   . Bradycardia   . Crohn's colitis 12/01/1997  . Neck pain   . Dysphagia   . Ankylosing spondylitis   . Coronary artery disease     a. s/p CABG x 6 in 1997 (LIMA->LAD, VG->RI ->OM1->OM2, VG->PDA->PLV;  b. 05/2011 Cath:  patent grafs, native prox rca and d2 dzs  ->med rx.  . Pneumonia     hx of PNA  . Blood transfusion     hx of transfusion without reaction  . Kidney stones   . GERD (gastroesophageal reflux disease)   . History of skin cancer   . Heart block   . 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 Hiatal hernia 12/03/2008  . Hx of echocardiogram     Echo 5/14:  Mild LVH, EF 55-60%, NL diast fxn, mild LAE   . Fatty liver   . Cholelithiasis     Past Surgical History  Procedure Laterality Date  . Coronary artery bypass graft  04/19/1995    x7  .  Umbilical hernia repair    . Trigger finger release    . Cancer removal  2014    removed from left outter leg  . Tonsillectomy and adenoidectomy  1944  . Hernia repair  1989  . Colonoscopy  2012  . Left heart catheterization with coronary/graft angiogram N/A 05/24/2011    Procedure: LEFT HEART CATHETERIZATION WITH Beatrix Fetters;  Surgeon: Larey Dresser, MD;  Location: [facility] CATH LAB;  Service: Cardiovascular;  Laterality: N/A;     Current Outpatient Prescriptions  Medication Sig Dispense Refill  . aspirin 81 MG tablet Take 81 mg by mouth daily.      Marland Kitchen atorvastatin (LIPITOR)  80 MG tablet TAKE 1 TABLET DAILY AT 6 PM. 90 tablet 1  . carvedilol (COREG) 3.125 MG tablet Take 1 tablet (3.125 mg total) by mouth 2 (two) times daily with a meal. 180 tablet 3  . cyanocobalamin (,VITAMIN B-12,) 1000 MCG/ML injection INJECT 1ML ONCE MONTHLY. 3 mL 0  . isosorbide mononitrate (IMDUR) 60 MG 24 hr tablet Take 1 tablet (60 mg total) by mouth daily. 90 tablet 3  . losartan (COZAAR) 100 MG tablet TAKE 1 TABLET EVERY DAY 90 tablet 1  . mercaptopurine (PURINETHOL) 50 MG tablet TAKE 1 TABLET DAILY ON AN EMPTY STOMACH- 1 HOUR BEFORE OR 2 HOURS AFTER A MEAL. **CAUTION: CHEMOTHERAPY** 90 tablet 1  . mesalamine (LIALDA) 1.2 G EC tablet Take 2 tablets (2.4 g total) by mouth daily with breakfast. 76 tablet 0  . metFORMIN (GLUCOPHAGE) 500 MG tablet Take 1 tablet (500 mg total) by mouth 2 (two) times daily with a meal. 180 tablet 1  . nitroGLYCERIN (NITROSTAT) 0.4 MG SL tablet Place 1 tablet (0.4 mg total) under the tongue every 5 (five) minutes as needed. For chest pain 25 tablet 3  . SYRINGE-NEEDLE, DISP, 3 ML (BD ECLIPSE SYRINGE) 25G X 5/8" 3 ML MISC Use with B12 injections. Inject once weekly x 3 weeks, then once montly 16 each 0  . [DISCONTINUED] escitalopram (LEXAPRO) 10 MG tablet Take 10 mg by mouth daily.     . [DISCONTINUED] esomeprazole (NEXIUM) 40 MG capsule Take 40 mg by mouth as needed.       No current facility-administered medications for this visit.    Allergies:   Review of patient's allergies indicates no known allergies.    Social History:  The patient  reports that he quit smoking about 31 years ago. He uses smokeless tobacco. He reports that he does not drink alcohol or use illicit drugs.   Family History:  The patient's family history includes Arthritis in his mother; Cancer in his paternal grandmother; Crohn's disease in an other family member; Heart disease in his father, mother, sister, and another family member. There is no history of Colon cancer.    ROS:   Please  see the history of present illness.   Review of Systems  Cardiovascular: Positive for chest pain.  Neurological: Positive for dizziness.  All other systems reviewed and are negative.     PHYSICAL EXAM: VS:  BP 140/62 mmHg  Pulse 49  Ht 5' 8.5" (1.74 m)  Wt 203 lb 1.9 oz (92.135 kg)  BMI 30.43 kg/m2    Wt Readings from Last 3 Encounters:  09/16/14 203 lb 1.9 oz (92.135 kg)  03/30/14 209 lb 1.9 oz (94.856 kg)  02/17/14 211 lb 9.6 oz (95.981 kg)     GEN: Well nourished, well developed, in no acute distress HEENT: normal Neck: no JVD,  no masses Cardiac:  Normal S1/S2, RRR; no murmur ,  no rubs or gallops, trace-1+ bilateral LE edema L > R  Respiratory:  clear to auscultation bilaterally, no wheezing, rhonchi or rales. GI: soft, nontender, nondistended, + BS MS: no deformity or atrophy Skin: warm and dry  Neuro:  CNs II-XII intact, Strength and sensation are intact Psych: Normal affect   EKG:  EKG is ordered today.  It demonstrates:   Sinus brady, HR 49, RBBB, 1st degree AVB (PR 232 ms), QTc 372 ms, no change from prior tracing.    Recent Labs: 02/17/2014: ALT 9; BUN 17; Creatinine, Ser 0.96; Hemoglobin 13.7; Platelets 159.0; Potassium 4.5; Sodium 139    Lipid Panel    Component Value Date/Time   CHOL 122 08/03/2013 1122   TRIG 63.0 08/03/2013 1122   TRIG 54 12/07/2005 1117   HDL 61.50 08/03/2013 1122   CHOLHDL 2 08/03/2013 1122   CHOLHDL 2.1 CALC 12/07/2005 1117   VLDL 12.6 08/03/2013 1122   LDLCALC 48 08/03/2013 1122      ASSESSMENT AND PLAN:    CAD S/P CABG:  His last cath in 2013 demonstrated patent grafts with severe stenosis in a small Dx beyond LIMA insertion.  This is treated medically and he has had stable CCS 2 symptoms for years.  He now notes increasing frequency in his chest symptoms.  They are somewhat atypical.  He is able to exert himself sometimes without symptoms.  His symptoms are not really increasing in intensity.  For now, I think we should  intensify his medical Rx.  I will increase his Imdur to 60 QD.  If symptoms continue to worsen, will likely need to consider repeat cardiac cath.  If he does not have much improvement, we could consider stress testing first to assess for significant ischemia.  Continue ASA, statin, ARB.  HTN:  Borderline control.  Increase Imdur as noted.   HYPERLIPIDEMIA:  Continue statin.   CAROTID STENOSIS:  Repeat carotid duplex 11/2015.    Medication Changes: Current medicines are reviewed at length with the patient today.  Concerns regarding medicines are as outlined above.  The following changes have been made:   Discontinued Medications   FOLIC ACID (FOLVITE) 1 MG TABLET    Take 1 mg by mouth daily.   Modified Medications   Modified Medication Previous Medication   ISOSORBIDE MONONITRATE (IMDUR) 60 MG 24 HR TABLET isosorbide mononitrate (IMDUR) 30 MG 24 hr tablet      Take 1 tablet (60 mg total) by mouth daily.    Take 1 tablet (30 mg total) by mouth daily.   New Prescriptions   No medications on file    Labs/ tests ordered today include:   Orders Placed This Encounter  Procedures  . EKG 12-Lead      Disposition:    FU with Dr. Sherren Mocha or me 2 mos.     Signed, Versie Starks, MHS 09/16/2014 10:38 AM    [facility] [place], [place], [place]  [address] Phone: [phone]; Fax: [phone]

## 2014-09-16 ENCOUNTER — Encounter: Payer: Self-pay | Admitting: Physician Assistant

## 2014-09-16 ENCOUNTER — Ambulatory Visit (INDEPENDENT_AMBULATORY_CARE_PROVIDER_SITE_OTHER): Payer: Medicare Other | Admitting: Physician Assistant

## 2014-09-16 VITALS — BP 140/62 | HR 49 | Ht 68.5 in | Wt 203.1 lb

## 2014-09-16 DIAGNOSIS — I10 Essential (primary) hypertension: Secondary | ICD-10-CM

## 2014-09-16 DIAGNOSIS — I25119 Atherosclerotic heart disease of native coronary artery with unspecified angina pectoris: Secondary | ICD-10-CM

## 2014-09-16 DIAGNOSIS — I2510 Atherosclerotic heart disease of native coronary artery without angina pectoris: Secondary | ICD-10-CM | POA: Diagnosis not present

## 2014-09-16 DIAGNOSIS — I6523 Occlusion and stenosis of bilateral carotid arteries: Secondary | ICD-10-CM

## 2014-09-16 DIAGNOSIS — E785 Hyperlipidemia, unspecified: Secondary | ICD-10-CM | POA: Diagnosis not present

## 2014-09-16 MED ORDER — ISOSORBIDE MONONITRATE ER 60 MG PO TB24: 60.0000 mg | ORAL_TABLET | Freq: Every day | ORAL | Status: DC

## 2014-09-16 NOTE — Patient Instructions (Signed)
Medication Instructions:  1. INCREASE IMDUR TO 60 MG DAILY; NEW RX SENT  Labwork: NONE  Testing/Procedures: NONE  Follow-Up: Richardson Dopp, [facility] 11/18/14 @ 8:30  Any Other Special Instructions Will Be Listed Below (If Applicable).

## 2014-10-04 ENCOUNTER — Ambulatory Visit: Payer: Medicare Other | Admitting: Physician Assistant

## 2014-10-13 ENCOUNTER — Other Ambulatory Visit: Payer: Self-pay | Admitting: Internal Medicine

## 2014-10-19 ENCOUNTER — Encounter: Payer: Self-pay | Admitting: Physician Assistant

## 2014-10-19 ENCOUNTER — Telehealth: Payer: Self-pay | Admitting: *Deleted

## 2014-10-19 NOTE — Telephone Encounter (Signed)
I s/w pt today per Brynda Rim. PA to see if pt would be able to change appt to 11/18 from 1/17, due schedule change PA will be out of the office 11/17. Pt states would prefer 11/16 instead, he usually goes out of town on Friday. Appt 11/16 8:30.

## 2014-10-21 ENCOUNTER — Other Ambulatory Visit: Payer: Self-pay | Admitting: Internal Medicine

## 2014-10-26 ENCOUNTER — Other Ambulatory Visit: Payer: Self-pay | Admitting: Internal Medicine

## 2014-10-26 DIAGNOSIS — Z23 Encounter for immunization: Secondary | ICD-10-CM | POA: Diagnosis not present

## 2014-10-31 ENCOUNTER — Encounter (HOSPITAL_COMMUNITY): Payer: Self-pay

## 2014-10-31 ENCOUNTER — Emergency Department (HOSPITAL_COMMUNITY): Payer: Medicare Other

## 2014-10-31 ENCOUNTER — Emergency Department (HOSPITAL_COMMUNITY)
Admission: EM | Admit: 2014-10-31 | Discharge: 2014-10-31 | Disposition: A | Discharge status: Home / Self Care | Payer: Medicare Other | Attending: Emergency Medicine | Admitting: Emergency Medicine

## 2014-10-31 DIAGNOSIS — S0012XA Contusion of left eyelid and periocular area, initial encounter: Secondary | ICD-10-CM | POA: Diagnosis not present

## 2014-10-31 DIAGNOSIS — Y92009 Unspecified place in unspecified non-institutional (private) residence as the place of occurrence of the external cause: Secondary | ICD-10-CM | POA: Insufficient documentation

## 2014-10-31 DIAGNOSIS — I10 Essential (primary) hypertension: Secondary | ICD-10-CM | POA: Diagnosis not present

## 2014-10-31 DIAGNOSIS — S299XXA Unspecified injury of thorax, initial encounter: Secondary | ICD-10-CM | POA: Diagnosis present

## 2014-10-31 DIAGNOSIS — I2510 Atherosclerotic heart disease of native coronary artery without angina pectoris: Secondary | ICD-10-CM | POA: Diagnosis not present

## 2014-10-31 DIAGNOSIS — Y998 Other external cause status: Secondary | ICD-10-CM | POA: Insufficient documentation

## 2014-10-31 DIAGNOSIS — Z87891 Personal history of nicotine dependence: Secondary | ICD-10-CM | POA: Insufficient documentation

## 2014-10-31 DIAGNOSIS — Z87442 Personal history of urinary calculi: Secondary | ICD-10-CM | POA: Diagnosis not present

## 2014-10-31 DIAGNOSIS — Z7982 Long term (current) use of aspirin: Secondary | ICD-10-CM | POA: Diagnosis not present

## 2014-10-31 DIAGNOSIS — S3992XA Unspecified injury of lower back, initial encounter: Secondary | ICD-10-CM | POA: Diagnosis not present

## 2014-10-31 DIAGNOSIS — Z951 Presence of aortocoronary bypass graft: Secondary | ICD-10-CM | POA: Insufficient documentation

## 2014-10-31 DIAGNOSIS — Z79899 Other long term (current) drug therapy: Secondary | ICD-10-CM | POA: Diagnosis not present

## 2014-10-31 DIAGNOSIS — W01198A Fall on same level from slipping, tripping and stumbling with subsequent striking against other object, initial encounter: Secondary | ICD-10-CM | POA: Diagnosis not present

## 2014-10-31 DIAGNOSIS — S0081XA Abrasion of other part of head, initial encounter: Secondary | ICD-10-CM | POA: Diagnosis not present

## 2014-10-31 DIAGNOSIS — E785 Hyperlipidemia, unspecified: Secondary | ICD-10-CM | POA: Insufficient documentation

## 2014-10-31 DIAGNOSIS — Z8739 Personal history of other diseases of the musculoskeletal system and connective tissue: Secondary | ICD-10-CM | POA: Insufficient documentation

## 2014-10-31 DIAGNOSIS — S20212A Contusion of left front wall of thorax, initial encounter: Secondary | ICD-10-CM

## 2014-10-31 DIAGNOSIS — E119 Type 2 diabetes mellitus without complications: Secondary | ICD-10-CM | POA: Diagnosis not present

## 2014-10-31 DIAGNOSIS — Y9389 Activity, other specified: Secondary | ICD-10-CM | POA: Diagnosis not present

## 2014-10-31 DIAGNOSIS — Z8701 Personal history of pneumonia (recurrent): Secondary | ICD-10-CM | POA: Diagnosis not present

## 2014-10-31 DIAGNOSIS — S2232XA Fracture of one rib, left side, initial encounter for closed fracture: Secondary | ICD-10-CM | POA: Diagnosis not present

## 2014-10-31 DIAGNOSIS — S60512A Abrasion of left hand, initial encounter: Secondary | ICD-10-CM | POA: Diagnosis not present

## 2014-10-31 DIAGNOSIS — K5010 Crohn's disease of large intestine without complications: Secondary | ICD-10-CM | POA: Diagnosis not present

## 2014-10-31 DIAGNOSIS — S0512XA Contusion of eyeball and orbital tissues, left eye, initial encounter: Secondary | ICD-10-CM

## 2014-10-31 DIAGNOSIS — Z85828 Personal history of other malignant neoplasm of skin: Secondary | ICD-10-CM | POA: Insufficient documentation

## 2014-10-31 DIAGNOSIS — Z9889 Other specified postprocedural states: Secondary | ICD-10-CM | POA: Insufficient documentation

## 2014-10-31 DIAGNOSIS — W19XXXA Unspecified fall, initial encounter: Secondary | ICD-10-CM

## 2014-10-31 MED ORDER — OXYCODONE-ACETAMINOPHEN 5-325 MG PO TABS
1.0000 | ORAL_TABLET | ORAL | Status: DC | PRN
Start: 2014-10-31 — End: 2014-11-08

## 2014-10-31 MED ORDER — ACETAMINOPHEN 325 MG PO TABS
650.0000 mg | ORAL_TABLET | Freq: Once | ORAL | Status: AC
  Administered 2014-10-31: 650 mg via ORAL
  Filled 2014-10-31: qty 2

## 2014-10-31 NOTE — ED Provider Notes (Signed)
CSN: [id number]     Arrival date & time 10/31/14  1629 History   First MD Initiated Contact with Patient 10/31/14 1814     Chief Complaint  Patient presents with  . Fall     (Consider location/radiation/quality/duration/timing/severity/associated sxs/prior Treatment) HPI   74 y.o. Male at home in [place] last night and slipped and fell striking left hand and face and ribs.  No loc, able to get up on own.  Patient complains of abrasion and bruise to face, abrsion to hand, and bruise on left chest. Walking ok, no sob.  PMD Dr. Lillia Mountain  Past Medical History  Diagnosis Date  . Hypertension   . Hyperlipidemia   . Diabetes mellitus   . Bradycardia   . Crohn's colitis ([place]) 12/01/1997  . Neck pain   . Dysphagia   . Ankylosing spondylitis ([facility])   . Coronary artery disease     a. s/p CABG x 6 in 1997 (LIMA->LAD, VG->RI ->OM1->OM2, VG->PDA->PLV;  b. 05/2011 Cath:  patent grafs, native prox rca and d2 dzs  ->med rx.  . Pneumonia     hx of PNA  . Blood transfusion     hx of transfusion without reaction  . Kidney stones   . GERD (gastroesophageal reflux disease)   . History of skin cancer   . Heart block   . 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 Hiatal hernia 12/03/2008  . Hx of echocardiogram     Echo 5/14:  Mild LVH, EF 55-60%, NL diast fxn, mild LAE   . Fatty liver   . Cholelithiasis    Past Surgical History  Procedure Laterality Date  . Coronary artery bypass graft  04/19/1995    x7  . Umbilical hernia repair    . Trigger finger release    . Cancer removal  2014    removed from left outter leg  . Tonsillectomy and adenoidectomy  1944  . Hernia repair  1989  . Colonoscopy  2012  . Left heart catheterization with coronary/graft angiogram N/A 05/24/2011    Procedure: LEFT HEART CATHETERIZATION WITH Beatrix Fetters;  Surgeon: Larey Dresser, MD;  Location[facility] CATH LAB;  Service: Cardiovascular;  Laterality: N/A;   Family History  Problem  Relation Age of Onset  . Heart disease Father   . Heart disease Mother   . Arthritis Mother   . Heart disease    . Crohn's disease    . Colon cancer Neg Hx   . Heart disease Sister   . Cancer Paternal Grandmother     colon   Social History  Substance Use Topics  . Smoking status: Former Smoker    Quit date: 01/02/1983  . Smokeless tobacco: Current User  . Alcohol Use: Yes     Comment: tottie every night    Review of Systems    Allergies  Review of patient's allergies indicates no known allergies.  Home Medications   Prior to Admission medications   Medication Sig Start Date End Date Taking? Authorizing Provider  aspirin 81 MG tablet Take 81 mg by mouth daily.      Historical Provider, MD  atorvastatin (LIPITOR) 80 MG tablet TAKE 1 TABLET DAILY AT 6 PM. 09/14/14   Sherren Mocha, MD  carvedilol (COREG) 3.125 MG tablet Take 1 tablet (3.125 mg total) by mouth 2 (two) times daily with a meal. 03/30/14   Sherren Mocha, MD  cyanocobalamin (,VITAMIN B-12,) 1000 MCG/ML injection INJECT 1ML ONCE MONTHLY. 08/30/14   [name]  Ronnald Ramp, MD  isosorbide mononitrate (IMDUR) 60 MG 24 hr tablet Take 1 tablet (60 mg total) by mouth daily. 09/16/14   Liliane Shi, PA-C  LIALDA 1.2 G EC tablet TAKE 2 TABLETS EVERY DAY WITH BREAKFAST 10/21/14   Jerene Bears, MD  losartan (COZAAR) 100 MG tablet TAKE 1 TABLET EVERY DAY 09/14/14   Sherren Mocha, MD  mercaptopurine (PURINETHOL) 50 MG tablet TAKE 1 TABLET DAILY ON AN EMPTY STOMACH- 1 HOUR BEFORE OR 2 HOURS AFTER A MEAL. **CAUTION: CHEMOTHERAPY** 10/26/14   Jerene Bears, MD  metFORMIN (GLUCOPHAGE) 500 MG tablet Take 1 tablet (500 mg total) by mouth 2 (two) times daily with a meal. 03/09/14   Janith Lima, MD  nitroGLYCERIN (NITROSTAT) 0.4 MG SL tablet Place 1 tablet (0.4 mg total) under the tongue every 5 (five) minutes as needed. For chest pain 04/01/13   Sherren Mocha, MD  SYRINGE-NEEDLE, DISP, 3 ML (BD ECLIPSE SYRINGE) 25G X 5/8" 3 ML MISC Use with B12  injections. Inject once weekly x 3 weeks, then once montly 01/28/13   Neena Rhymes, MD   BP 130/70 mmHg  Pulse 76  Temp(Src) 97.4 F (36.3 C) (Oral)  Resp 16  Ht 5' 8.5" (1.74 m)  Wt 200 lb (90.719 kg)  BMI 29.96 kg/m2  SpO2 96% Physical Exam  Constitutional: He is oriented to person, place, and time. He appears well-developed and well-nourished.  HENT:  Head: Normocephalic and atraumatic.  Right Ear: External ear normal.  Left Ear: External ear normal.  Nose: Nose normal.  Mouth/Throat: Oropharynx is clear and moist.  Contusion left periorbital, abrasion left forehead  Eyes: Conjunctivae and EOM are normal. Pupils are equal, round, and reactive to light.  Neck: Normal range of motion. Neck supple.  Cardiovascular: Normal rate, regular rhythm, normal heart sounds and intact distal pulses.   Pulmonary/Chest: Effort normal and breath sounds normal. No respiratory distress. He has no wheezes. He exhibits no tenderness.  Abdominal: Soft. Bowel sounds are normal. He exhibits no distension and no mass. There is no tenderness. There is no guarding.  Musculoskeletal: Normal range of motion.       Hands: Neurological: He is alert and oriented to person, place, and time. He has normal reflexes. He exhibits normal muscle tone. Coordination normal.  Skin: Skin is warm and dry.  Psychiatric: He has a normal mood and affect. His behavior is normal. Judgment and thought content normal.  Nursing note and vitals reviewed.   ED Course  Procedures (including critical care time) Labs Review Labs Reviewed - No data to display  Imaging Review Dg Ribs Unilateral W/chest Left  10/31/2014  CLINICAL DATA:  Stepped on a log, fell onto LEFT side onto a rock. History of hypertension, diabetes, pneumonia. EXAM: LEFT RIBS AND CHEST - 3+ VIEW COMPARISON:  Chest radiograph and LEFT rib series December 26, 2006 FINDINGS: Cardiac silhouette is mildly enlarged. Status post median sternotomy for CABG. Mildly  calcified aortic knob. No pleural effusion or focal consolidation. No pneumothorax. Acute LEFT nondisplaced seventh lateral rib fracture. Old LEFT fourth and fifth rib fractures IMPRESSION: Acute nondisplaced LEFT lateral seventh rib fracture. Old LEFT fourth and fifth rib fractures. Mild cardiomegaly, no acute pulmonary process. Electronically Signed   By: Elon Alas M.D.   On: 10/31/2014 19:00   Dg Lumbar Spine Complete  10/31/2014  CLINICAL DATA:  [place] over a log, landed on LEFT side on a rock. EXAM: LUMBAR SPINE - COMPLETE 4+ VIEW COMPARISON:  None.  FINDINGS: Lumbar vertebral bodies intact and aligned with maintenance of lumbar lordosis. Bridging included thoracic and upper lumbar calcified syndesmophytes. Associated intradiscal calcifications to level of L2-3. Severe lower lumbar facet arthropathy. No pars interarticularis defects. No destructive bony lesions. Ankylosis of the sacroiliac joints. Surgical clips project in LEFT upper quadrant. Moderate aortoiliac vascular calcifications. IMPRESSION: No acute fracture deformity or dislocation. Findings of ankylosing spondylitis. Electronically Signed   By: Elon Alas M.D.   On: 10/31/2014 19:02   I have personally reviewed and evaluated these images and lab results as part of my medical decision-making.   EKG Interpretation None      MDM   Final diagnoses:  None    74 year old man who fell yesterday. He has a facial contusion. He denies any loss of consciousness. He denies any headache. He is awake and alert and oriented and is not on blood thinners. Subsequently, I did not get a head CT. I discussed with he and his wife need for return if there are any of the above symptoms. He is also complaining of left-sided chest wall pain. He does have a rib fracture seen on plain x-Akiko Schexnider. He has no evidence of pneumothorax. He is given incentive spirometry. He had not taken any pain medicine prior to coming in here. I've given him Tylenol  here for pain. I discussed that he may use Percocet at bedtime but should use Tylenol primarily if this will control his pain. He is given a prescription for Percocet #15. He has an abrasion to his hand his tetanus is up-to-date. We have discussed return precautions and need for follow-up in hand is wife voice understanding.    Pattricia Boss, MD 10/31/14 [id number]

## 2014-10-31 NOTE — Discharge Instructions (Signed)
Rib Fracture °A rib fracture is a break or crack in one of the bones of the ribs. The ribs are like a cage that goes around your upper chest. A broken or cracked rib is often painful, but most do not cause other problems. Most rib fractures heal on their own in 1-3 months. °HOME CARE °· Avoid activities that cause pain to the injured area. Protect your injured area. °· Slowly increase activity as told by your doctor. °· Take medicine as told by your doctor. °· Put ice on the injured area for the first 1-2 days after you have been treated or as told by your doctor. °¨ Put ice in a plastic bag. °¨ Place a towel between your skin and the bag. °¨ Leave the ice on for 15-20 minutes at a time, every 2 hours while you are awake. °· Do deep breathing as told by your doctor. You may be told to: °¨ Take deep breaths many times a day. °¨ Cough many times a day while hugging a pillow. °¨ Use a device (incentive spirometer) to perform deep breathing many times a day. °· Drink enough fluids to keep your pee (urine) clear or pale yellow.   °· Do not wear a rib belt or binder. These do not allow you to breathe deeply. °GET HELP RIGHT AWAY IF:  °· You have a fever. °· You have trouble breathing.   °· You cannot stop coughing. °· You cough up thick or bloody spit (mucus).   °· You feel sick to your stomach (nauseous), throw up (vomit), or have belly (abdominal) pain.   °· Your pain gets worse and medicine does not help.   °MAKE SURE YOU:  °· Understand these instructions. °· Will watch your condition. °· Will get help right away if you are not doing well or get worse. °  °This information is not intended to replace advice given to you by your health care provider. Make sure you discuss any questions you have with your health care provider. °  °Document Released: 09/27/2007 Document Revised: 04/14/2012 Document Reviewed: 02/20/2012 °Elsevier Interactive Patient Education ©2016 [employer] ° °

## 2014-10-31 NOTE — ED Notes (Signed)
Patient states he stepped on a log which caused him to fall. Patient states he landed on a rock. No LOC.  Patient has a hematoma to the left eye area,  Left thumb and hand pain, left rib cage pain,

## 2014-11-08 ENCOUNTER — Ambulatory Visit (INDEPENDENT_AMBULATORY_CARE_PROVIDER_SITE_OTHER)
Admission: RE | Admit: 2014-11-08 | Discharge: 2014-11-08 | Disposition: A | Discharge status: Home / Self Care | Payer: Medicare Other | Source: Ambulatory Visit | Attending: Internal Medicine | Admitting: Internal Medicine

## 2014-11-08 ENCOUNTER — Ambulatory Visit (INDEPENDENT_AMBULATORY_CARE_PROVIDER_SITE_OTHER): Payer: Medicare Other | Admitting: Internal Medicine

## 2014-11-08 ENCOUNTER — Encounter: Payer: Self-pay | Admitting: Internal Medicine

## 2014-11-08 VITALS — BP 112/68 | HR 80 | Temp 97.3°F | Resp 16 | Ht 68.5 in | Wt 203.0 lb

## 2014-11-08 DIAGNOSIS — I6523 Occlusion and stenosis of bilateral carotid arteries: Secondary | ICD-10-CM | POA: Diagnosis not present

## 2014-11-08 DIAGNOSIS — M25511 Pain in right shoulder: Secondary | ICD-10-CM

## 2014-11-08 DIAGNOSIS — D519 Vitamin B12 deficiency anemia, unspecified: Secondary | ICD-10-CM

## 2014-11-08 DIAGNOSIS — S2242XD Multiple fractures of ribs, left side, subsequent encounter for fracture with routine healing: Secondary | ICD-10-CM

## 2014-11-08 DIAGNOSIS — E118 Type 2 diabetes mellitus with unspecified complications: Secondary | ICD-10-CM

## 2014-11-08 DIAGNOSIS — Z23 Encounter for immunization: Secondary | ICD-10-CM

## 2014-11-08 DIAGNOSIS — S2232XA Fracture of one rib, left side, initial encounter for closed fracture: Secondary | ICD-10-CM | POA: Diagnosis not present

## 2014-11-08 DIAGNOSIS — S2249XA Multiple fractures of ribs, unspecified side, initial encounter for closed fracture: Secondary | ICD-10-CM | POA: Insufficient documentation

## 2014-11-08 MED ORDER — CYANOCOBALAMIN 1000 MCG/ML IJ SOLN
INTRAMUSCULAR | Status: DC
Start: 2014-11-08 — End: 2015-03-21

## 2014-11-08 MED ORDER — METFORMIN HCL 500 MG PO TABS: 500.0000 mg | ORAL_TABLET | Freq: Two times a day (BID) | ORAL | Status: DC

## 2014-11-08 NOTE — Progress Notes (Signed)
Pre visit review using our clinic review tool, if applicable. No additional management support is needed unless otherwise documented below in the visit note. 

## 2014-11-08 NOTE — Progress Notes (Signed)
Subjective:  Patient ID: Andres Smith, male    DOB: 15-Nov-1940  Age: 74 y.o. MRN: [id number]  CC: Shoulder Pain   HPI Andres Smith presents for follow-up after recent fall. He was seen in emergency room and found to have rib fractures on the left side. He feels like those are healing well. There is still some soreness, bruising and swelling but it is getting better. He was given a prescription for Percocet and has not been taking it. His new complaint today is right shoulder pain.  Outpatient Prescriptions Prior to Visit  Medication Sig Dispense Refill  . aspirin 81 MG tablet Take 81 mg by mouth daily.      Marland Kitchen atorvastatin (LIPITOR) 80 MG tablet TAKE 1 TABLET DAILY AT 6 PM. 90 tablet 1  . carvedilol (COREG) 3.125 MG tablet Take 1 tablet (3.125 mg total) by mouth 2 (two) times daily with a meal. 180 tablet 3  . isosorbide mononitrate (IMDUR) 60 MG 24 hr tablet Take 1 tablet (60 mg total) by mouth daily. 90 tablet 3  . LIALDA 1.2 G EC tablet TAKE 2 TABLETS EVERY DAY WITH BREAKFAST 180 tablet 0  . losartan (COZAAR) 100 MG tablet TAKE 1 TABLET EVERY DAY 90 tablet 1  . mercaptopurine (PURINETHOL) 50 MG tablet TAKE 1 TABLET DAILY ON AN EMPTY STOMACH- 1 HOUR BEFORE OR 2 HOURS AFTER A MEAL. **CAUTION: CHEMOTHERAPY** 90 tablet 0  . nitroGLYCERIN (NITROSTAT) 0.4 MG SL tablet Place 1 tablet (0.4 mg total) under the tongue every 5 (five) minutes as needed. For chest pain 25 tablet 3  . SYRINGE-NEEDLE, DISP, 3 ML (BD ECLIPSE SYRINGE) 25G X 5/8" 3 ML MISC Use with B12 injections. Inject once weekly x 3 weeks, then once montly 16 each 0  . cyanocobalamin (,VITAMIN B-12,) 1000 MCG/ML injection INJECT 1ML ONCE MONTHLY. 3 mL 0  . metFORMIN (GLUCOPHAGE) 500 MG tablet Take 1 tablet (500 mg total) by mouth 2 (two) times daily with a meal. 180 tablet 1  . oxyCODONE-acetaminophen (PERCOCET/ROXICET) 5-325 MG tablet Take 1 tablet by mouth every 4 (four) hours as needed for severe pain. 15 tablet 0   No  facility-administered medications prior to visit.    ROS Review of Systems  Constitutional: Negative.  Negative for fever, chills, diaphoresis, appetite change and fatigue.  HENT: Negative.  Negative for sinus pressure and trouble swallowing.   Eyes: Negative.   Respiratory: Negative.  Negative for cough, choking, chest tightness, shortness of breath and stridor.   Cardiovascular: Negative.  Negative for chest pain, palpitations and leg swelling.  Gastrointestinal: Negative.  Negative for nausea, vomiting, abdominal pain, diarrhea and constipation.  Endocrine: Negative.   Genitourinary: Negative.  Negative for dysuria, urgency, hematuria, flank pain and difficulty urinating.  Musculoskeletal: Positive for back pain and arthralgias. Negative for myalgias, neck pain and neck stiffness.  Skin: Negative.  Negative for rash.  Allergic/Immunologic: Negative.   Neurological: Negative.  Negative for dizziness, weakness, light-headedness, numbness and headaches.  Hematological: Negative.  Negative for adenopathy. Does not bruise/bleed easily.  Psychiatric/Behavioral: Negative.     Objective:  BP 112/68 mmHg  Pulse 80  Temp(Src) 97.3 F (36.3 C) (Oral)  Resp 16  Ht 5' 8.5" (1.74 m)  Wt 203 lb (92.08 kg)  BMI 30.41 kg/m2  SpO2 98%  BP Readings from Last 3 Encounters:  11/08/14 112/68  10/31/14 130/70  09/16/14 140/62    Wt Readings from Last 3 Encounters:  11/08/14 203 lb (92.08 kg)  10/31/14  200 lb (90.719 kg)  09/16/14 203 lb 1.9 oz (92.135 kg)    Physical Exam  Constitutional: He is oriented to person, place, and time.  Non-toxic appearance. He does not have a sickly appearance. He does not appear ill. No distress.  HENT:  Head: Normocephalic and atraumatic.  Mouth/Throat: Oropharynx is clear and moist. No oropharyngeal exudate.  Eyes: Conjunctivae are normal. Right eye exhibits no discharge. Left eye exhibits no discharge. No scleral icterus.  Neck: Normal range of motion.  Neck supple. No JVD present. No tracheal deviation present. No thyromegaly present.  Cardiovascular: Normal rate, regular rhythm, normal heart sounds and intact distal pulses.  Exam reveals no gallop and no friction rub.   No murmur heard. Pulmonary/Chest: Effort normal and breath sounds normal. No stridor. No respiratory distress. He has no wheezes. He has no rales. Chest wall is not dull to percussion. He exhibits tenderness, bony tenderness and deformity. He exhibits no mass, no laceration, no crepitus, no edema and no retraction.    Abdominal: Soft. Bowel sounds are normal. He exhibits no distension and no mass. There is no tenderness. There is no rebound and no guarding.  Musculoskeletal: He exhibits no edema or tenderness.       Right shoulder: He exhibits decreased range of motion. He exhibits no tenderness, no bony tenderness, no swelling, no effusion, no crepitus, no deformity, no laceration, no pain, no spasm, normal pulse and normal strength.       Cervical back: Normal. He exhibits no tenderness and no bony tenderness.       Thoracic back: Normal. He exhibits normal range of motion, no tenderness, no bony tenderness, no swelling, no deformity and no pain.  Lymphadenopathy:    He has no cervical adenopathy.  Neurological: He is oriented to person, place, and time.  Skin: Skin is warm and dry. No rash noted. He is not diaphoretic. No erythema. No pallor.  Vitals reviewed.   Lab Results  Component Value Date   WBC 4.6 02/17/2014   HGB 13.7 02/17/2014   HCT 40.3 02/17/2014   PLT 159.0 02/17/2014   GLUCOSE 124* 02/17/2014   CHOL 122 08/03/2013   TRIG 63.0 08/03/2013   HDL 61.50 08/03/2013   LDLCALC 48 08/03/2013   ALT 9 02/17/2014   AST 13 02/17/2014   NA 139 02/17/2014   K 4.5 02/17/2014   CL 105 02/17/2014   CREATININE 0.96 02/17/2014   BUN 17 02/17/2014   CO2 30 02/17/2014   TSH 2.77 08/03/2013   PSA 2.29 05/06/2012   INR 0.9 ratio 12/02/2008   HGBA1C 6.9* 08/03/2013     Dg Ribs Unilateral W/chest Left  10/31/2014  CLINICAL DATA:  Stepped on a log, fell onto LEFT side onto a rock. History of hypertension, diabetes, pneumonia. EXAM: LEFT RIBS AND CHEST - 3+ VIEW COMPARISON:  Chest radiograph and LEFT rib series December 26, 2006 FINDINGS: Cardiac silhouette is mildly enlarged. Status post median sternotomy for CABG. Mildly calcified aortic knob. No pleural effusion or focal consolidation. No pneumothorax. Acute LEFT nondisplaced seventh lateral rib fracture. Old LEFT fourth and fifth rib fractures IMPRESSION: Acute nondisplaced LEFT lateral seventh rib fracture. Old LEFT fourth and fifth rib fractures. Mild cardiomegaly, no acute pulmonary process. Electronically Signed   By: Elon Alas M.D.   On: 10/31/2014 19:00   Dg Lumbar Spine Complete  10/31/2014  CLINICAL DATA:  [place] over a log, landed on LEFT side on a rock. EXAM: LUMBAR SPINE - COMPLETE 4+ VIEW COMPARISON:  None. FINDINGS: Lumbar vertebral bodies intact and aligned with maintenance of lumbar lordosis. Bridging included thoracic and upper lumbar calcified syndesmophytes. Associated intradiscal calcifications to level of L2-3. Severe lower lumbar facet arthropathy. No pars interarticularis defects. No destructive bony lesions. Ankylosis of the sacroiliac joints. Surgical clips project in LEFT upper quadrant. Moderate aortoiliac vascular calcifications. IMPRESSION: No acute fracture deformity or dislocation. Findings of ankylosing spondylitis. Electronically Signed   By: Elon Alas M.D.   On: 10/31/2014 19:02    Assessment & Plan:   Andres Smith was seen today for shoulder pain.  Diagnoses and all orders for this visit:  Right shoulder pain- plain film shows degenerative changes and spurring, I've asked him to see orthopedics to consider having a surgical intervention. -     DG Shoulder Right; Future -     Ambulatory referral to Orthopedic Surgery  Fracture five ribs-closed, left, with routine  healing, subsequent encounter- the fractures appear to be healing without any complications. -     DG Chest 2 View; Future  Type 2 diabetes mellitus with complication, without long-term current use of insulin (HCC) -     metFORMIN (GLUCOPHAGE) 500 MG tablet; Take 1 tablet (500 mg total) by mouth 2 (two) times daily with a meal.  B12 deficiency anemia -     cyanocobalamin (,VITAMIN B-12,) 1000 MCG/ML injection; INJECT 1ML ONCE MONTHLY.  Other orders -     Cancel: metFORMIN (GLUCOPHAGE) 500 MG tablet; Take 1 tablet (500 mg total) by mouth 2 (two) times daily with a meal. -     Cancel: cyanocobalamin (,VITAMIN B-12,) 1000 MCG/ML injection; INJECT 1ML ONCE MONTHLY. -     Pneumococcal polysaccharide vaccine 23-valent greater than or equal to 2yo subcutaneous/IM   I have discontinued Andres Smith's oxyCODONE-acetaminophen. I am also having him maintain his aspirin, SYRINGE-NEEDLE (DISP) 3 ML, nitroGLYCERIN, carvedilol, losartan, atorvastatin, isosorbide mononitrate, LIALDA, mercaptopurine, cyanocobalamin, and metFORMIN.  Meds ordered this encounter  Medications  . cyanocobalamin (,VITAMIN B-12,) 1000 MCG/ML injection    Sig: INJECT 1ML ONCE MONTHLY.    Dispense:  3 mL    Refill:  0  . metFORMIN (GLUCOPHAGE) 500 MG tablet    Sig: Take 1 tablet (500 mg total) by mouth 2 (two) times daily with a meal.    Dispense:  180 tablet    Refill:  1     Follow-up: Return in about 2 months (around 01/08/2015).  [name], MD

## 2014-11-08 NOTE — Patient Instructions (Signed)

## 2014-11-09 ENCOUNTER — Encounter: Payer: Self-pay | Admitting: Internal Medicine

## 2014-11-15 ENCOUNTER — Ambulatory Visit (INDEPENDENT_AMBULATORY_CARE_PROVIDER_SITE_OTHER): Payer: Medicare Other | Admitting: Family

## 2014-11-15 ENCOUNTER — Encounter: Payer: Self-pay | Admitting: Family

## 2014-11-15 VITALS — BP 150/80 | HR 57 | Temp 97.5°F | Resp 18 | Ht 68.5 in | Wt 209.0 lb

## 2014-11-15 DIAGNOSIS — I6523 Occlusion and stenosis of bilateral carotid arteries: Secondary | ICD-10-CM

## 2014-11-15 DIAGNOSIS — R2232 Localized swelling, mass and lump, left upper limb: Secondary | ICD-10-CM | POA: Diagnosis not present

## 2014-11-15 DIAGNOSIS — R2230 Localized swelling, mass and lump, unspecified upper limb: Secondary | ICD-10-CM | POA: Insufficient documentation

## 2014-11-15 NOTE — Progress Notes (Signed)
Subjective:    Patient ID: Andres Smith, male    DOB: [birth year], 74 y.o.   MRN: [id number]  Chief Complaint  Patient presents with  . Cyst    has what feels like a cyst underneath left armpit area, was just dx with broken ribs and doesn't know if that has anything to do with it    HPI:  Andres Smith is a 74 y.o. male who  has a past medical history of Hypertension; Hyperlipidemia; Diabetes mellitus; Bradycardia; Crohn's colitis ([place]) (12/01/1997); Neck pain; Dysphagia; Ankylosing spondylitis ([place]); Coronary artery disease; Pneumonia; Blood transfusion; Kidney stones; GERD (gastroesophageal reflux disease); History of skin cancer; Heart block; Universal ulcerative (chronic) colitis (05/21/2001); Hiatal hernia (12/03/2008); echocardiogram; Fatty liver; and Cholelithiasis. and presents today for an follow up office visit.   1.) Cyst - Associated symptoms of a cyst located in his left axilla has been going on for approximately for about 1 week. Denies tenderness or changes in size since he first noticed it. Decribed as non-tender and floating around. Recently had a broken rib on the same side. Denies fevers or chills. Denies any modifying factors or treatments that make it better or worse.   No Known Allergies   Current Outpatient Prescriptions on File Prior to Visit  Medication Sig Dispense Refill  . aspirin 81 MG tablet Take 81 mg by mouth daily.      Marland Kitchen atorvastatin (LIPITOR) 80 MG tablet TAKE 1 TABLET DAILY AT 6 PM. 90 tablet 1  . carvedilol (COREG) 3.125 MG tablet Take 1 tablet (3.125 mg total) by mouth 2 (two) times daily with a meal. 180 tablet 3  . cyanocobalamin (,VITAMIN B-12,) 1000 MCG/ML injection INJECT 1ML ONCE MONTHLY. 3 mL 0  . isosorbide mononitrate (IMDUR) 60 MG 24 hr tablet Take 1 tablet (60 mg total) by mouth daily. 90 tablet 3  . LIALDA 1.2 G EC tablet TAKE 2 TABLETS EVERY DAY WITH BREAKFAST 180 tablet 0  . losartan (COZAAR) 100 MG tablet TAKE 1 TABLET EVERY DAY 90 tablet  1  . mercaptopurine (PURINETHOL) 50 MG tablet TAKE 1 TABLET DAILY ON AN EMPTY STOMACH- 1 HOUR BEFORE OR 2 HOURS AFTER A MEAL. **CAUTION: CHEMOTHERAPY** 90 tablet 0  . metFORMIN (GLUCOPHAGE) 500 MG tablet Take 1 tablet (500 mg total) by mouth 2 (two) times daily with a meal. 180 tablet 1  . nitroGLYCERIN (NITROSTAT) 0.4 MG SL tablet Place 1 tablet (0.4 mg total) under the tongue every 5 (five) minutes as needed. For chest pain 25 tablet 3  . SYRINGE-NEEDLE, DISP, 3 ML (BD ECLIPSE SYRINGE) 25G X 5/8" 3 ML MISC Use with B12 injections. Inject once weekly x 3 weeks, then once montly 16 each 0  . [DISCONTINUED] escitalopram (LEXAPRO) 10 MG tablet Take 10 mg by mouth daily.     . [DISCONTINUED] esomeprazole (NEXIUM) 40 MG capsule Take 40 mg by mouth as needed.       No current facility-administered medications on file prior to visit.    Review of Systems  Constitutional: Negative for fever and chills.  HENT: Negative for congestion.   Cardiovascular: Negative for chest pain.      Objective:    BP 150/80 mmHg  Pulse 57  Temp(Src) 97.5 F (36.4 C) (Oral)  Resp 18  Ht 5' 8.5" (1.74 m)  Wt 209 lb (94.802 kg)  BMI 31.31 kg/m2  SpO2 97% Nursing note and vital signs reviewed.  Physical Exam  Constitutional: He is oriented to person, place,  and time. He appears well-developed and well-nourished. No distress.  Cardiovascular: Normal rate, regular rhythm, normal heart sounds and intact distal pulses.   Pulmonary/Chest: Effort normal and breath sounds normal.  4-5 mm firm, non-tender mobile mass/cyst located on the lower medial aspect of the axilla.   Neurological: He is alert and oriented to person, place, and time.  Skin: Skin is warm and dry.  Psychiatric: He has a normal mood and affect. His behavior is normal. Judgment and thought content normal.       Assessment & Plan:   Problem List Items Addressed This Visit      Other   Mass of axilla - Primary    Mass/cyst left axilla noted.  Most likely lymphadenopathy. Obtain ultrasound to rule out underlying pathology. Continue to monitor at this time. Follow-up pending ultrasound results.      Relevant Orders   US BREAST LTD UNI LEFT INC AXILLA

## 2014-11-15 NOTE — Progress Notes (Signed)
Pre visit review using our clinic review tool, if applicable. No additional management support is needed unless otherwise documented below in the visit note. 

## 2014-11-15 NOTE — Assessment & Plan Note (Signed)
Mass/cyst left axilla noted. Most likely lymphadenopathy. Obtain ultrasound to rule out underlying pathology. Continue to monitor at this time. Follow-up pending ultrasound results.

## 2014-11-15 NOTE — Patient Instructions (Signed)
Thank you for choosing [facility].  Summary/Instructions:  If your symptoms worsen or fail to improve, please contact our office for further instruction, or in case of emergency go directly to the emergency room at the closest medical facility.    Lymphadenopathy Lymphadenopathy refers to swollen or enlarged lymph glands, also called lymph nodes. Lymph glands are part of your body's defense (immune) system, which protects the body from infections, germs, and diseases. Lymph glands are found in many locations in your body, including the neck, underarm, and groin.  Many things can cause lymph glands to become enlarged. When your immune system responds to germs, such as viruses or bacteria, infection-fighting cells and fluid build up. This causes the glands to grow in size. Usually, this is not something to worry about. The swelling and any soreness often go away without treatment. However, swollen lymph glands can also be caused by a number of diseases. Your health care provider may do various tests to help determine the cause. If the cause of your swollen lymph glands cannot be found, it is important to monitor your condition to make sure the swelling goes away. HOME CARE INSTRUCTIONS Watch your condition for any changes. The following actions may help to lessen any discomfort you are feeling:  Get plenty of rest.  Take medicines only as directed by your health care provider. Your health care provider may recommend over-the-counter medicines for pain.  Apply moist heat compresses to the site of swollen lymph nodes as directed by your health care provider. This can help reduce any pain.  Check your lymph nodes daily for any changes.  Keep all follow-up visits as directed by your health care provider. This is important. SEEK MEDICAL CARE IF:  Your lymph nodes are still swollen after 2 weeks.  Your swelling increases or spreads to other areas.  Your lymph nodes are hard, seem fixed to  the skin, or are growing rapidly.  Your skin over the lymph nodes is red and inflamed.  You have a fever.  You have chills.  You have fatigue.  You develop a sore throat.  You have abdominal pain.  You have weight loss.  You have night sweats. SEEK IMMEDIATE MEDICAL CARE IF:  You notice fluid leaking from the area of the enlarged lymph node.  You have severe pain in any area of your body.  You have chest pain.  You have shortness of breath.   This information is not intended to replace advice given to you by your health care provider. Make sure you discuss any questions you have with your health care provider.   Document Released: 09/27/2007 Document Revised: 01/08/2014 Document Reviewed: 07/23/2013 Elsevier Interactive Patient Education [employer].

## 2014-11-18 ENCOUNTER — Ambulatory Visit: Payer: Medicare Other | Admitting: Physician Assistant

## 2014-11-19 ENCOUNTER — Ambulatory Visit: Payer: Medicare Other | Admitting: Physician Assistant

## 2014-11-22 ENCOUNTER — Telehealth: Payer: Self-pay | Admitting: Internal Medicine

## 2014-11-22 DIAGNOSIS — N63 Unspecified lump in unspecified breast: Secondary | ICD-10-CM

## 2014-11-22 DIAGNOSIS — R2232 Localized swelling, mass and lump, left upper limb: Secondary | ICD-10-CM

## 2014-11-22 NOTE — Telephone Encounter (Signed)
Order placed

## 2014-11-22 NOTE — Telephone Encounter (Signed)
[facility], please enter order for bilateral diagnostic mammogram and note the location of the mass. Thank you!

## 2014-11-23 NOTE — Telephone Encounter (Signed)
Please enter order for diagnostic bilateral mammogram per [facility]. Thanks Marya Amsler!

## 2014-12-01 NOTE — Telephone Encounter (Signed)
Done. Thanks.

## 2014-12-01 NOTE — Progress Notes (Signed)
Cardiology Office Note   Date:  12/02/2014   ID:  Andres Smith, DOB Nov 22, 1940, MRN [id number]   Patient Care Team: Janith Lima, MD as PCP - General (Internal Medicine) Sherren Mocha, MD as Consulting Physician (Cardiology)    Chief Complaint  Patient presents with  . Follow-up  . Coronary Artery Disease     History of Present Illness: Andres Smith is a 74 y.o. male with a hx of CAD, s/p CABG, HTN, HL, DM2, inflammatory bowel disease, ankylosing spondylitis. Last cath in 05/2011 with patent grafts but severe stenosis in the small diagonal beyond the LIMA insertion. Medical Rx recommended. Patient seen in 3/16 and noted history of chronic dizziness with postural changes as well as stable CCS class II angina. Cardiac catheterization films were reviewed again from 2013. Medical therapy was continued. Of note, at that time, symptoms had not changed for several years. Dose of carvedilol was reduced secondary to his history of dizziness.   I saw him 9/16. He noted increasing frequency of chest discomfort. Patient noted chest discomfort that would start with initiation of exercise and resolve with continuation.  His dose of nitrates were adjusted. He returns for follow-up.  He is doing well.  He has only had 1 episode of chest pain since he was here last.  [place]  No significant exertional symptoms or DOE.  He did fall in 10//16 and broke a rib on the L.  He denies syncope.  He has occ dizziness. No orthopnea, PND.  He has mild LE edema without change.    Studies/Reports Reviewed Today:  LHC (5/13):  Distal left main 40%, mid LAD occluded, ostial D2 70-80%, proximal D2 distal to LIMA touchdown 90%, proximal circumflex occluded, proximal RCA 99%, distal RCA occluded, mid PDA beyond SVG touchdown 60-70%,EF 50%; LIMA-D2/LAD patent, SVG-ramus/OM1/OM2 patent, SVG-PDA and PLV patent >>> med rx  Echo (5/14):  Mild LVH, EF 55-60%, normal wall motion, mild  LAE  Carotid US (11/14):  Bilateral ICA 1-39%-followup 1 year  Carotid US (11/15):  Bilateral ICA 1-39% - FU 2 years   Past Medical History  Diagnosis Date  . Hypertension   . Hyperlipidemia   . Diabetes mellitus   . Bradycardia   . Crohn's colitis ([place]) 12/01/1997  . Neck pain   . Dysphagia   . Ankylosing spondylitis ([place])   . Coronary artery disease     a. s/p CABG x 6 in 1997 (LIMA->LAD, VG->RI ->OM1->OM2, VG->PDA->PLV;  b. 05/2011 Cath:  patent grafs, native prox rca and d2 dzs  ->med rx.  . Pneumonia     hx of PNA  . Blood transfusion     hx of transfusion without reaction  . Kidney stones   . GERD (gastroesophageal reflux disease)   . History of skin cancer   . Heart block   . 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 Hiatal hernia 12/03/2008  . Hx of echocardiogram     Echo 5/14:  Mild LVH, EF 55-60%, NL diast fxn, mild LAE   . Fatty liver   . Cholelithiasis     Past Surgical History  Procedure Laterality Date  . Coronary artery bypass graft  04/19/1995    x7  . Umbilical hernia repair    . Trigger finger release    . Cancer removal  2014    removed from left outter leg  . Tonsillectomy and adenoidectomy  1944  . Hernia repair  1989  . Colonoscopy  2012  . Left heart catheterization with coronary/graft angiogram N/A 05/24/2011    Procedure: LEFT HEART CATHETERIZATION WITH Beatrix Fetters;  Surgeon: Larey Dresser, MD;  [facility];  Service: Cardiovascular;  Laterality: N/A;     Current Outpatient Prescriptions  Medication Sig Dispense Refill  . aspirin 81 MG tablet Take 81 mg by mouth daily.      Marland Kitchen atorvastatin (LIPITOR) 80 MG tablet TAKE 1 TABLET DAILY AT 6 PM. 90 tablet 1  . carvedilol (COREG) 3.125 MG tablet Take 1 tablet (3.125 mg total) by mouth 2 (two) times daily with a meal. 180 tablet 3  . cyanocobalamin (,VITAMIN B-12,) 1000 MCG/ML injection INJECT 1ML ONCE MONTHLY. 3 mL 0  . isosorbide mononitrate (IMDUR) 60 MG  24 hr tablet Take 1 tablet (60 mg total) by mouth daily. 90 tablet 3  . LIALDA 1.2 G EC tablet TAKE 2 TABLETS EVERY DAY WITH BREAKFAST 180 tablet 0  . losartan (COZAAR) 100 MG tablet TAKE 1 TABLET EVERY DAY 90 tablet 1  . mercaptopurine (PURINETHOL) 50 MG tablet TAKE 1 TABLET DAILY ON AN EMPTY STOMACH- 1 HOUR BEFORE OR 2 HOURS AFTER A MEAL. **CAUTION: CHEMOTHERAPY** 90 tablet 0  . metFORMIN (GLUCOPHAGE) 500 MG tablet Take 1 tablet (500 mg total) by mouth 2 (two) times daily with a meal. 180 tablet 1  . nitroGLYCERIN (NITROSTAT) 0.4 MG SL tablet Place 1 tablet (0.4 mg total) under the tongue every 5 (five) minutes as needed. For chest pain 25 tablet 3  . SYRINGE-NEEDLE, DISP, 3 ML (BD ECLIPSE SYRINGE) 25G X 5/8" 3 ML MISC Use with B12 injections. Inject once weekly x 3 weeks, then once montly 16 each 0  . [DISCONTINUED] escitalopram (LEXAPRO) 10 MG tablet Take 10 mg by mouth daily.     . [DISCONTINUED] esomeprazole (NEXIUM) 40 MG capsule Take 40 mg by mouth as needed.       No current facility-administered medications for this visit.    Allergies:   Review of patient's allergies indicates no known allergies.    Social History:   Social History   Social History  . Marital Status: Married    Spouse Name: N/A  . Number of Children: 2  . Years of Education: N/A   Occupational History  . semi retired    Social History Main Topics  . Smoking status: Former Smoker    Quit date: 01/02/1983  . Smokeless tobacco: Current User  . Alcohol Use: Yes     Comment: tottie every night  . Drug Use: No  . Sexual Activity: Not Currently   Other Topics Concern  . None   Social History Narrative   HSG, [place] - Civil engineering/education in industrial, [place] from [employer]. Married '64. 1 son '74, 1 dtr - '69; 1 grandchild, 1 on the way. 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 Lives with wife. ACP -              Family History:   Family History  Problem Relation Age of Onset  . Heart disease Father   . Heart disease Mother   . Arthritis Mother   . Heart disease    . Crohn's disease    . Colon cancer Neg Hx   . Heart disease Sister   .  Cancer Paternal Grandmother     colon      ROS:   Please see the history of present illness.   Review of Systems  Cardiovascular: Positive for chest pain and dyspnea on exertion.  Respiratory: Positive for cough.   Hematologic/Lymphatic: Bruises/bleeds easily.  Neurological: Positive for dizziness.  All other systems reviewed and are negative.     PHYSICAL EXAM: VS:  BP 122/60 mmHg  Pulse 53  Ht 5\' 8"  (1.727 m)  Wt 206 lb 12.8 oz (93.804 kg)  BMI 31.45 kg/m2    Wt Readings from Last 3 Encounters:  12/02/14 206 lb 12.8 oz (93.804 kg)  11/15/14 209 lb (94.802 kg)  11/08/14 203 lb (92.08 kg)     GEN: Well nourished, well developed, in no acute distress HEENT: normal Neck: no JVD,   no masses Cardiac:  Normal S1/S2, RRR; no murmur ,  no rubs or gallops, trace bilateral LE edema   Respiratory:  clear to auscultation bilaterally, no wheezing, rhonchi or rales. GI: soft, nontender, nondistended, + BS MS: no deformity or atrophy Skin: warm and dry  Neuro:  CNs II-XII intact, Strength and sensation are intact Psych: Normal affect   EKG:  EKG is ordered today.  It demonstrates:   Sinus bradycardia, HR 53, RBBB, first-degree AV block, PR 226 ms, QTC 379 ms, no change from prior tracings   Recent Labs: 02/17/2014: ALT 9; BUN 17; Creatinine, Ser 0.96; Hemoglobin 13.7; Platelets 159.0; Potassium 4.5; Sodium 139    Lipid Panel    Component Value Date/Time   CHOL 122 08/03/2013 1122   TRIG 63.0 08/03/2013 1122   TRIG 54 12/07/2005 1117   HDL 61.50 08/03/2013 1122   CHOLHDL 2 08/03/2013 1122   CHOLHDL 2.1 CALC 12/07/2005 1117    VLDL 12.6 08/03/2013 1122   LDLCALC 48 08/03/2013 1122      ASSESSMENT AND PLAN:  1. CAD S/P CABG: His last cath in 2013 demonstrated patent grafts with severe stenosis in a small Dx beyond LIMA insertion. This is treated medically and he has had stable CCS 2 symptoms for years. I saw him in 9/16 and increased his nitrates for increasing symptoms of chest pain.  His chest symptoms are now well controlled.   Continue ASA, statin, ARB, nitrates.  2. HTN: controlled. Arrange FU BMET.   3. HYPERLIPIDEMIA: Continue statin.   4. CAROTID STENOSIS: Repeat carotid duplex 11/2015.  5. Rib Fracture:  He does have a small nontender mobile nodule over his L chest.  I encouraged him to FU with his PCP if this does not resolve over the next 3-4 weeks to have it re-evaluated.      Medication Changes: Current medicines are reviewed at length with the patient today.  Concerns regarding medicines are as outlined above.  The following changes have been made:   Discontinued Medications   No medications on file   Modified Medications   No medications on file   New Prescriptions   No medications on file   Labs/ tests ordered today include:   No orders of the defined types were placed in this encounter.     Disposition:    FU with Dr. Sherren Mocha 3/17 as planned.     Signed, Versie Starks, MHS 12/02/2014 10:08 AM    [facility] [place], [place], [place]  [address] Phone: [phone]; Fax: [phone]

## 2014-12-02 ENCOUNTER — Ambulatory Visit (INDEPENDENT_AMBULATORY_CARE_PROVIDER_SITE_OTHER): Payer: Medicare Other | Admitting: Physician Assistant

## 2014-12-02 ENCOUNTER — Encounter: Payer: Self-pay | Admitting: Physician Assistant

## 2014-12-02 VITALS — BP 122/60 | HR 53 | Ht 68.0 in | Wt 206.8 lb

## 2014-12-02 DIAGNOSIS — E785 Hyperlipidemia, unspecified: Secondary | ICD-10-CM

## 2014-12-02 DIAGNOSIS — I25119 Atherosclerotic heart disease of native coronary artery with unspecified angina pectoris: Secondary | ICD-10-CM

## 2014-12-02 DIAGNOSIS — I10 Essential (primary) hypertension: Secondary | ICD-10-CM

## 2014-12-02 DIAGNOSIS — I6523 Occlusion and stenosis of bilateral carotid arteries: Secondary | ICD-10-CM

## 2014-12-02 NOTE — Patient Instructions (Addendum)
Medication Instructions:  Your physician recommends that you continue on your current medications as directed. Please refer to the Current Medication list given to you today.   Labwork: FASTING LIPID AND LIVER PANEL, BMET AT YOUR CONVEINECE    Testing/Procedures: NONE  Follow-Up: DR. Burt Knack 03/2015 WE WILL SEND OUT A REMINDER LETTER TO CALL AND MAKE APPT  Any Other Special Instructions Will Be Listed Below (If Applicable).   If you need a refill on your cardiac medications before your next appointment, please call your pharmacy.

## 2014-12-03 ENCOUNTER — Other Ambulatory Visit: Payer: Self-pay | Admitting: *Deleted

## 2014-12-03 ENCOUNTER — Other Ambulatory Visit (INDEPENDENT_AMBULATORY_CARE_PROVIDER_SITE_OTHER): Payer: Medicare Other | Admitting: *Deleted

## 2014-12-03 DIAGNOSIS — I25119 Atherosclerotic heart disease of native coronary artery with unspecified angina pectoris: Secondary | ICD-10-CM

## 2014-12-03 DIAGNOSIS — I209 Angina pectoris, unspecified: Secondary | ICD-10-CM | POA: Diagnosis not present

## 2014-12-03 DIAGNOSIS — E785 Hyperlipidemia, unspecified: Secondary | ICD-10-CM | POA: Diagnosis not present

## 2014-12-03 DIAGNOSIS — L820 Inflamed seborrheic keratosis: Secondary | ICD-10-CM | POA: Diagnosis not present

## 2014-12-03 DIAGNOSIS — L578 Other skin changes due to chronic exposure to nonionizing radiation: Secondary | ICD-10-CM | POA: Diagnosis not present

## 2014-12-03 DIAGNOSIS — I2510 Atherosclerotic heart disease of native coronary artery without angina pectoris: Secondary | ICD-10-CM | POA: Diagnosis not present

## 2014-12-03 DIAGNOSIS — L821 Other seborrheic keratosis: Secondary | ICD-10-CM | POA: Diagnosis not present

## 2014-12-03 DIAGNOSIS — L853 Xerosis cutis: Secondary | ICD-10-CM | POA: Diagnosis not present

## 2014-12-03 DIAGNOSIS — D1801 Hemangioma of skin and subcutaneous tissue: Secondary | ICD-10-CM | POA: Diagnosis not present

## 2014-12-03 DIAGNOSIS — Z85828 Personal history of other malignant neoplasm of skin: Secondary | ICD-10-CM | POA: Diagnosis not present

## 2014-12-03 DIAGNOSIS — L812 Freckles: Secondary | ICD-10-CM | POA: Diagnosis not present

## 2014-12-03 DIAGNOSIS — L570 Actinic keratosis: Secondary | ICD-10-CM | POA: Diagnosis not present

## 2014-12-03 LAB — LIPID PANEL
CHOL/HDL RATIO: 2.1 ratio (ref <=5.0)
CHOLESTEROL: 144 mg/dL (ref 125–200)
HDL: 68 mg/dL (ref >=40)
LDL Cholesterol: 64 mg/dL (ref <130)
TRIGLYCERIDES: 60 mg/dL (ref <150)
VLDL: 12 mg/dL (ref <30)

## 2014-12-03 LAB — HEPATIC FUNCTION PANEL
ALBUMIN: 3.8 g/dL (ref 3.6–5.1)
ALT: 13 U/L (ref 9–46)
AST: 16 U/L (ref 10–35)
Alkaline Phosphatase: 56 U/L (ref 40–115)
Bilirubin, Direct: 0.2 mg/dL (ref <=0.2)
Indirect Bilirubin: 0.8 mg/dL (ref 0.2–1.2)
TOTAL PROTEIN: 6.9 g/dL (ref 6.1–8.1)
Total Bilirubin: 1 mg/dL (ref 0.2–1.2)

## 2014-12-03 LAB — BASIC METABOLIC PANEL
BUN: 15 mg/dL (ref 7–25)
CHLORIDE: 102 mmol/L (ref 98–110)
CO2: 28 mmol/L (ref 20–31)
Calcium: 8.8 mg/dL (ref 8.6–10.3)
Creat: 0.96 mg/dL (ref 0.70–1.18)
Glucose, Bld: 163 mg/dL — ABNORMAL HIGH (ref 65–99)
POTASSIUM: 4.5 mmol/L (ref 3.5–5.3)
Sodium: 139 mmol/L (ref 135–146)

## 2014-12-03 NOTE — Addendum Note (Signed)
Addended by: Eulis Foster on: 12/03/2014 08:33 AM   Modules accepted: Orders

## 2014-12-06 ENCOUNTER — Telehealth: Payer: Self-pay | Admitting: *Deleted

## 2014-12-06 NOTE — Telephone Encounter (Signed)
Pt notified of lab results by phone with verbal understanding.  

## 2014-12-10 ENCOUNTER — Other Ambulatory Visit: Payer: Medicare Other

## 2014-12-21 ENCOUNTER — Ambulatory Visit: Payer: Medicare Other | Admitting: Internal Medicine

## 2014-12-31 ENCOUNTER — Ambulatory Visit (INDEPENDENT_AMBULATORY_CARE_PROVIDER_SITE_OTHER): Payer: Medicare Other | Admitting: Internal Medicine

## 2014-12-31 ENCOUNTER — Encounter: Payer: Self-pay | Admitting: Internal Medicine

## 2014-12-31 ENCOUNTER — Ambulatory Visit: Payer: Medicare Other | Admitting: Internal Medicine

## 2014-12-31 VITALS — BP 116/58 | HR 56 | Ht 67.5 in | Wt 210.2 lb

## 2014-12-31 DIAGNOSIS — I6523 Occlusion and stenosis of bilateral carotid arteries: Secondary | ICD-10-CM | POA: Diagnosis not present

## 2014-12-31 DIAGNOSIS — K5190 Ulcerative colitis, unspecified, without complications: Secondary | ICD-10-CM

## 2014-12-31 DIAGNOSIS — Z79899 Other long term (current) drug therapy: Secondary | ICD-10-CM | POA: Diagnosis not present

## 2014-12-31 DIAGNOSIS — E538 Deficiency of other specified B group vitamins: Secondary | ICD-10-CM

## 2014-12-31 MED ORDER — NA SULFATE-K SULFATE-MG SULF 17.5-3.13-1.6 GM/177ML PO SOLN: ORAL | Status: DC

## 2014-12-31 NOTE — Patient Instructions (Signed)
You have been scheduled for a colonoscopy. Please follow written instructions given to you at your visit today.  Please pick up your prep supplies at the pharmacy within the next 1-3 days. If you use inhalers (even only as needed), please bring them with you on the day of your procedure. Your physician has requested that you go to www.startemmi.com and enter the access code given to you at your visit today. This web site gives a general overview about your procedure. However, you should still follow specific instructions given to you by our office regarding your preparation for the procedure.  Continue Lialda and mercaptopurine.

## 2014-12-31 NOTE — Progress Notes (Signed)
   Subjective:    Patient ID: Andres Smith, male    DOB: 10/23/1940, 74 y.o.   MRN: [id number]  HPI [name] is a 74 year old male with a long-standing history of pan ulcerative colitis and ankylosing spondylitis, CAD status post CABG, hypertension, hyperlipidemia, diabetes, GERD and gallstones who is here for follow-up. He was last seen in February 2016. He is here alone today. He reports he continues to do well from a colitis standpoint. He continue 6-MP 50 mg daily and Lialda 2.4 g daily. He reports no abdominal pain. No trouble with diarrhea, rectal bleeding or melena. Very rare diarrhea depending on what he eats and he will occasionally use Imodium. No upper GI complaint or hepatobiliary complaint. He takes B12 monthly by injection.  He had hepatic function panel and basic metabolic panel performed recently by cardiology which was reviewed and this is normal, with the exception of mild hyperglycemia.  Last colonoscopy was January 2015 with Dr. [name]  Review of Systems As per history of present illness, otherwise negative  Current Medications, Allergies, Past Medical History, Past Surgical History, Family History and Social History were reviewed in [facility] record.     Objective:   Physical Exam BP 116/58 mmHg  Pulse 56  Ht 5' 7.5" (1.715 m)  Wt 210 lb 4 oz (95.369 kg)  BMI 32.42 kg/m2 Constitutional: Well-developed and well-nourished. No distress. HEENT: Normocephalic and atraumatic. Oropharynx is clear and moist. No oropharyngeal exudate. Conjunctivae are normal.  No scleral icterus. Neck: Neck supple. Trachea midline. Cardiovascular: Normal rate, regular rhythm and intact distal pulses. No M/R/G Pulmonary/chest: Effort normal and breath sounds normal. No wheezing, rales or rhonchi. Abdominal: Soft, obese, nontender, nondistended. Bowel sounds active throughout.  Extremities: no clubbing, cyanosis, or edema Lymphadenopathy: No cervical adenopathy  noted. Neurological: Alert and oriented to person place and time. Skin: Skin is warm and dry. Psychiatric: Normal mood and affect. Behavior is normal.  CMP     Component Value Date/Time   NA 139 12/03/2014 0833   K 4.5 12/03/2014 0833   CL 102 12/03/2014 0833   CO2 28 12/03/2014 0833   GLUCOSE 163* 12/03/2014 0833   GLUCOSE 98 12/12/2005 0729   BUN 15 12/03/2014 0833   CREATININE 0.96 12/03/2014 0833   CREATININE 0.96 02/17/2014 1552   CALCIUM 8.8 12/03/2014 0833   PROT 6.9 12/03/2014 0833   ALBUMIN 3.8 12/03/2014 0833   AST 16 12/03/2014 0833   ALT 13 12/03/2014 0833   ALKPHOS 56 12/03/2014 0833   BILITOT 1.0 12/03/2014 0833   GFRNONAA 82* 05/25/2011 1210   GFRAA >90 05/25/2011 1210       Assessment & Plan:   74 year old male with a long-standing history of pan ulcerative colitis and ankylosing spondylitis, CAD status post CABG, hypertension, hyperlipidemia, diabetes, GERD and gallstones who is here for follow-up.  1. 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 Continue 6-MP 50 mg daily and Lialda 2.4 g daily. He is due surveillance colonoscopy at this time. We discussed the risks, benefits and alternatives and he is agreeable to proceed. Plan for quadrant surveillance biopsies at the time of this procedure. Hepatic function panel monitoring of CMP stable. Office follow-up in 6 months  2. B12 deficiency -- continue monthly IM B12

## 2015-01-13 ENCOUNTER — Other Ambulatory Visit: Payer: Self-pay | Admitting: Internal Medicine

## 2015-01-26 ENCOUNTER — Other Ambulatory Visit: Payer: Self-pay | Admitting: Internal Medicine

## 2015-02-04 ENCOUNTER — Ambulatory Visit (AMBULATORY_SURGERY_CENTER): Payer: Medicare Other | Admitting: Internal Medicine

## 2015-02-04 ENCOUNTER — Encounter: Payer: Self-pay | Admitting: Internal Medicine

## 2015-02-04 VITALS — BP 100/72 | HR 50 | Temp 96.6°F | Resp 18 | Ht 67.0 in | Wt 210.0 lb

## 2015-02-04 DIAGNOSIS — I10 Essential (primary) hypertension: Secondary | ICD-10-CM | POA: Diagnosis not present

## 2015-02-04 DIAGNOSIS — K5190 Ulcerative colitis, unspecified, without complications: Secondary | ICD-10-CM

## 2015-02-04 DIAGNOSIS — I2510 Atherosclerotic heart disease of native coronary artery without angina pectoris: Secondary | ICD-10-CM | POA: Diagnosis not present

## 2015-02-04 DIAGNOSIS — E669 Obesity, unspecified: Secondary | ICD-10-CM | POA: Diagnosis not present

## 2015-02-04 DIAGNOSIS — E119 Type 2 diabetes mellitus without complications: Secondary | ICD-10-CM | POA: Diagnosis not present

## 2015-02-04 DIAGNOSIS — Z8719 Personal history of other diseases of the digestive system: Secondary | ICD-10-CM | POA: Diagnosis not present

## 2015-02-04 LAB — GLUCOSE, CAPILLARY
Glucose-Capillary: 110 mg/dL — ABNORMAL HIGH (ref 65–99)
Glucose-Capillary: 118 mg/dL — ABNORMAL HIGH (ref 65–99)

## 2015-02-04 LAB — HM COLONOSCOPY

## 2015-02-04 MED ORDER — SODIUM CHLORIDE 0.9 % IV SOLN: 500.0000 mL | INTRAVENOUS | Status: DC

## 2015-02-04 NOTE — Progress Notes (Signed)
EKG stri[p shown to [employer], CRNA.  He said that he strip was fine, and to proceed with the adm process,

## 2015-02-04 NOTE — Progress Notes (Signed)
Dentures upper and lower removed by crna,Bill Silvio Clayman as dentures were loose.Dentures places in denture cup & labeled w/ id

## 2015-02-04 NOTE — Progress Notes (Signed)
To recoverey, report to [place], Therapist, sports, VSS

## 2015-02-04 NOTE — Patient Instructions (Signed)

## 2015-02-04 NOTE — Progress Notes (Signed)
Called to room to assist during endoscopic procedure.  Patient ID and intended procedure confirmed with present staff. Received instructions for my participation in the procedure from the performing physician.  

## 2015-02-04 NOTE — Op Note (Signed)
[facility]  [address]. [place], [address]   COLONOSCOPY PROCEDURE REPORT  PATIENT: Andres Smith, Andres Smith  MR#: [id number] BIRTHDATE: [birth year] , 72  yrs. old GENDER: male ENDOSCOPIST: Jerene Bears, MD PROCEDURE DATE:  02/04/2015 PROCEDURE:   Colonoscopy, surveillance and Colonoscopy with biopsy First Screening Colonoscopy - Avg.  risk and is 50 yrs.  old or older - No.  Prior Negative Screening - Now for repeat screening. N/A  History of Adenoma - Now for follow-up colonoscopy & has been > or = to 3 yrs.  N/A  Polyps removed today? No Recommend repeat exam, <10 yrs? Yes high risk ASA CLASS:   Class III INDICATIONS:   Long standing pan-ulcerative colitis for IBD surveillance, last colonoscopy 2 yrs ago. MEDICATIONS: Monitored anesthesia care and Propofol 200 mg IV  DESCRIPTION OF PROCEDURE:   After the risks benefits and alternatives of the procedure were thoroughly explained, informed consent was obtained.  The digital rectal exam revealed no rectal mass.   The LB [id number]  endoscope was introduced through the anus and advanced to the cecum, which was identified by both the appendix and ileocecal valve. No adverse events experienced. The quality of the prep was good.  (Suprep was used)  The instrument was then slowly withdrawn as the colon was fully examined. Estimated blood loss is zero unless otherwise noted in this procedure report.    COLON FINDINGS: The colonic mucosa appeared normal throughout the entire examined colon without evidence of active colitis. The colon was tubular in nature with loss of normal folds felt secondary to history of chronic ulcerative colitis. Multiple IBD surveillance biopsies were performed in 4 quadrant fashion every 8-10 cm throughout the length of the colon using cold forceps. [Impression] The time to cecum = 3.3 Withdrawal time = 9.1   The scope was withdrawn and the procedure completed.  COMPLICATIONS: There were no  immediate complications.  ENDOSCOPIC IMPRESSION: No evidence of active ulcerative colitis; multiple surveillance biopsies as above  RECOMMENDATIONS: 1.  Await biopsy results 2.  Continue current medications 3.  Repeat colonoscopy in 2 years for IBD surveillance  eSigned:  Jerene Bears, MD 02/04/2015 11:55 AM   cc:  the patient, Dr. Ronnald Ramp   PATIENT NAME:  Andres Smith, Andres Smith MR#: [id number]

## 2015-02-07 ENCOUNTER — Telehealth: Payer: Self-pay

## 2015-02-07 NOTE — Telephone Encounter (Signed)
  Follow up Call-  Call back number 02/04/2015 01/14/2013  Post procedure Call Back phone  # [phone] cell [phone]  Permission to leave phone message Yes Yes     Patient questions:  Do you have a fever, pain , or abdominal swelling? No. Pain Score  0 *  Have you tolerated food without any problems? Yes.    Have you been able to return to your normal activities? Yes.    Do you have any questions about your discharge instructions: Diet   No. Medications  No. Follow up visit  No.  Do you have questions or concerns about your Care? No.  Actions: * If pain score is 4 or above: No action needed, pain <4.

## 2015-02-08 ENCOUNTER — Encounter: Payer: Self-pay | Admitting: Internal Medicine

## 2015-02-10 ENCOUNTER — Other Ambulatory Visit: Payer: Self-pay | Admitting: Cardiovascular Disease

## 2015-03-10 DIAGNOSIS — H25043 Posterior subcapsular polar age-related cataract, bilateral: Secondary | ICD-10-CM | POA: Diagnosis not present

## 2015-03-10 DIAGNOSIS — Z0100 Encounter for examination of eyes and vision without abnormal findings: Secondary | ICD-10-CM | POA: Diagnosis not present

## 2015-03-10 DIAGNOSIS — E113293 Type 2 diabetes mellitus with mild nonproliferative diabetic retinopathy without macular edema, bilateral: Secondary | ICD-10-CM | POA: Diagnosis not present

## 2015-03-10 DIAGNOSIS — H25013 Cortical age-related cataract, bilateral: Secondary | ICD-10-CM | POA: Diagnosis not present

## 2015-03-10 LAB — HM DIABETES EYE EXAM

## 2015-03-16 ENCOUNTER — Encounter: Payer: Self-pay | Admitting: Cardiovascular Disease

## 2015-03-16 ENCOUNTER — Ambulatory Visit (INDEPENDENT_AMBULATORY_CARE_PROVIDER_SITE_OTHER): Payer: Medicare Other | Admitting: Cardiovascular Disease

## 2015-03-16 VITALS — BP 132/58 | HR 60 | Ht 68.0 in | Wt 210.8 lb

## 2015-03-16 DIAGNOSIS — I25118 Atherosclerotic heart disease of native coronary artery with other forms of angina pectoris: Secondary | ICD-10-CM

## 2015-03-16 DIAGNOSIS — I10 Essential (primary) hypertension: Secondary | ICD-10-CM

## 2015-03-16 DIAGNOSIS — R0602 Shortness of breath: Secondary | ICD-10-CM

## 2015-03-16 DIAGNOSIS — R011 Cardiac murmur, unspecified: Secondary | ICD-10-CM | POA: Diagnosis not present

## 2015-03-16 MED ORDER — ATORVASTATIN CALCIUM 80 MG PO TABS: 80.0000 mg | ORAL_TABLET | Freq: Every day | ORAL | Status: DC

## 2015-03-16 MED ORDER — CARVEDILOL 3.125 MG PO TABS
3.1250 mg | ORAL_TABLET | Freq: Two times a day (BID) | ORAL | Status: DC
Start: 2015-03-16 — End: 2016-03-26

## 2015-03-16 MED ORDER — LOSARTAN POTASSIUM 100 MG PO TABS: 100.0000 mg | ORAL_TABLET | Freq: Every day | ORAL | Status: DC

## 2015-03-16 NOTE — Patient Instructions (Signed)

## 2015-03-16 NOTE — Progress Notes (Signed)
Cardiology Office Note Date:  03/16/2015   ID:  [name], DOB [birth year], MRN [id number]  PCP:  [name], MD  Cardiologist:  Sherren Mocha, MD    Chief Complaint  Patient presents with  . Hypertension  . Coronary Artery Disease   History of Present Illness: Andres Smith is a 75 y.o. male who presents for follow-up of CAD. He underwent 6 vessel CABG in 1997. Most recent cath in 2013 showed continued graft patency.   The patient has had chronic angina - saw Richardson Dopp in 9/16 and imdur was increased. He's done well since then. Denies any recent chest pain. He's not been active. He has mild DOE but attributes to deconditioning. No orthopnea, PND, cough, or palpitations. No other complaints.    Past Medical History  Diagnosis Date  . Hypertension   . Hyperlipidemia   . Diabetes mellitus   . Bradycardia   . Crohn's colitis ([place]) 12/01/1997  . Neck pain   . Dysphagia   . Ankylosing spondylitis ([place])   . Coronary artery disease     a. s/p CABG x 6 in 1997 (LIMA->LAD, VG->RI ->OM1->OM2, VG->PDA->PLV;  b. 05/2011 Cath:  patent grafs, native prox rca and d2 dzs  ->med rx.  . Pneumonia     hx of PNA  . Blood transfusion     hx of transfusion without reaction  . Kidney stones   . GERD (gastroesophageal reflux disease)   . History of skin cancer   . Heart block   . 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 Hiatal hernia 12/03/2008  . Hx of echocardiogram     Echo 5/14:  Mild LVH, EF 55-60%, NL diast fxn, mild LAE   . Fatty liver   . Cholelithiasis     Past Surgical History  Procedure Laterality Date  . Coronary artery bypass graft  04/19/1995    x7  . Umbilical hernia repair    . Trigger finger release    . Cancer removal  2014    removed from left outter leg  . Tonsillectomy and adenoidectomy  1944  . Hernia repair  1989  . Colonoscopy  2012  . Left heart catheterization with coronary/graft angiogram N/A 05/24/2011    Procedure: LEFT HEART  CATHETERIZATION WITH Beatrix Fetters;  Surgeon: Larey Dresser, MD;  Location: [facility] CATH LAB;  Service: Cardiovascular;  Laterality: N/A;    Current Outpatient Prescriptions  Medication Sig Dispense Refill  . aspirin 81 MG tablet Take 81 mg by mouth daily.      Marland Kitchen atorvastatin (LIPITOR) 80 MG tablet Take 80 mg by mouth daily at 6 PM.    . carvedilol (COREG) 3.125 MG tablet Take 1 tablet (3.125 mg total) by mouth 2 (two) times daily with a meal. 180 tablet 3  . cyanocobalamin (,VITAMIN B-12,) 1000 MCG/ML injection INJECT 1ML ONCE MONTHLY. 3 mL 0  . isosorbide mononitrate (IMDUR) 60 MG 24 hr tablet Take 1 tablet (60 mg total) by mouth daily. 90 tablet 3  . losartan (COZAAR) 100 MG tablet Take 1 tablet (100 mg total) by mouth daily. 90 tablet 2  . mercaptopurine (PURINETHOL) 50 MG tablet Take 50 mg by mouth daily. Give on an empty stomach 1 hour before or 2 hours after meals. Caution: Chemotherapy.    . mesalamine (LIALDA) 1.2 g EC tablet Take 2.4 g by mouth daily with breakfast. Take 2 tablets by mouth daily with breakfast    . metFORMIN (GLUCOPHAGE) 500 MG tablet  Take 1 tablet (500 mg total) by mouth 2 (two) times daily with a meal. 180 tablet 1  . nitroGLYCERIN (NITROSTAT) 0.4 MG SL tablet Place 1 tablet (0.4 mg total) under the tongue every 5 (five) minutes as needed. For chest pain 25 tablet 3  . SYRINGE-NEEDLE, DISP, 3 ML (BD ECLIPSE SYRINGE) 25G X 5/8" 3 ML MISC Use with B12 injections. Inject once weekly x 3 weeks, then once montly 16 each 0  . [DISCONTINUED] escitalopram (LEXAPRO) 10 MG tablet Take 10 mg by mouth daily.     . [DISCONTINUED] esomeprazole (NEXIUM) 40 MG capsule Take 40 mg by mouth as needed.       No current facility-administered medications for this visit.    Allergies:   Review of patient's allergies indicates no known allergies.   Social History:  The patient  reports that he quit smoking about 32 years ago. He has quit using smokeless tobacco. He reports that  he drinks alcohol. He reports that he does not use illicit drugs.   Family History:  The patient's  family history includes Arthritis in his mother; Cancer in his paternal grandmother; Heart disease in his father, mother, and sister. There is no history of Colon cancer.    ROS:  Please see the history of present illness.  Otherwise, review of systems is positive for back pain and easy bruising.  All other systems are reviewed and negative.    PHYSICAL EXAM: VS:  BP 132/58 mmHg  Pulse 60  Ht 5\' 8"  (1.727 m)  Wt 95.618 kg (210 lb 12.8 oz)  BMI 32.06 kg/m2 , BMI Body mass index is 32.06 kg/(m^2). GEN: Well nourished, well developed, overweight male in no acute distress HEENT: normal Neck: no JVD, no masses. No carotid bruits Cardiac: RRR with 2/6 systolic murmur best heard at the base               Respiratory:  clear to auscultation bilaterally, normal work of breathing GI: soft, nontender, nondistended, + BS, abdomen protuberant MS: no deformity or atrophy Ext: 1+ pretibial edema L>R, pedal pulses 2+= bilaterally Skin: warm and dry, no rash Neuro:  Strength and sensation are intact Psych: euthymic mood, full affect  EKG:  EKG is not ordered today.  Recent Labs: 12/03/2014: ALT 13; BUN 15; Creat 0.96; Potassium 4.5; Sodium 139   Lipid Panel     Component Value Date/Time   CHOL 144 12/03/2014 0833   TRIG 60 12/03/2014 0833   TRIG 54 12/07/2005 1117   HDL 68 12/03/2014 0833   CHOLHDL 2.1 12/03/2014 0833   CHOLHDL 2.1 CALC 12/07/2005 1117   VLDL 12 12/03/2014 0833   LDLCALC 64 12/03/2014 0833      Wt Readings from Last 3 Encounters:  03/16/15 95.618 kg (210 lb 12.8 oz)  02/04/15 95.255 kg (210 lb)  12/31/14 95.369 kg (210 lb 4 oz)     Cardiac Studies Reviewed: Cardiac cath 05/24/2011: Coronary angiography: Coronary dominance: right  Left mainstem: 40% distal LM stenosis.   Left anterior descending (LAD): Total occlusion of the mid LAD just after a moderate 2nd  diagonal with 70-80% ostial stenosis. Patent sequential LIMA to D2 and LAD (touching initially on D2). 90% stenosis in the proximal D2 just distal to the LIMA touchdown. The LAD beyond the LIMA touchdown has mild disease.   Left circumflex (LCx): Totally occluded proximal LCx. Sequential SVG to ramus/OM1/OM2 was patent. The native vessels were relatively small.   Right coronary artery (RCA): There was  a 99% stenosis in the proximal RCA. The RCA then gave rise to 3 small acute marginals. The distal RCA was totally occluded. Sequential SVG to PDA and PLV was patent. There was a 60-70% stenosis in the mid PDA beyond the SVG touchdown.   Left ventriculography: Hand LV-gram. EF 50%.   Final Conclusions: Grafts are patent. There are two areas that I see that could be causing ischemia. One is the 99% stenosis in the proximal RCA. The at-risk area here are 3 small acute marginals prior the distal RCA occlusion. I do not think intervention here is worthwhile. The second area is D2 which has a 90% stenosis after LIMA touchdown. PCI here would be feasible and this could be the culprit for his angina. However, the vessel is small and borderline for stenting.   Recommendations: I think that initial medical management is appropriate here. Cardiac enzymes were negative. HR is too low for beta blocker, will add Imdur 30 mg daily. Would get full echo.   Echo (5/14):  Mild LVH, EF 55-60%, normal wall motion, mild LAE  Carotid US (11/14):  Bilateral ICA 1-39%-followup 1 year  Carotid US (11/15):  Bilateral ICA 1-39% - FU 2 years  ASSESSMENT AND PLAN: 1.  CAD, native vessel, with CCS I angina. Current medications reviewed and will be continued.   2. HTN: BP controlled on current Rx.   3. Hyperlipidemia: lipids reviewed. Treated with statin drug. Needs lifestyle modification - discussed at length today.  4. Type II DM: lifestyle modification/weight loss strategies  reviewed.   5. Carotid stenosis without hx of stroke/TIA: mild carotid stenosis by most recent doppler - study reviewed today <40% stenosis bilaterally  6. Heart murmur: recommend an updated echo.   Current medicines are reviewed with the patient today.  The patient does not have concerns regarding medicines.  Labs/ tests ordered today include:  No orders of the defined types were placed in this encounter.    Disposition:   FU one year  Signed, Sherren Mocha, MD  03/16/2015 9:16 AM    [facility] [address], [place], [place]  [address] Phone: [phone]; Fax: [phone]

## 2015-03-21 ENCOUNTER — Other Ambulatory Visit: Payer: Self-pay | Admitting: Internal Medicine

## 2015-03-21 DIAGNOSIS — D519 Vitamin B12 deficiency anemia, unspecified: Secondary | ICD-10-CM

## 2015-03-21 MED ORDER — CYANOCOBALAMIN 1000 MCG/ML IJ SOLN: INTRAMUSCULAR | Status: DC

## 2015-03-21 NOTE — Telephone Encounter (Signed)
Patient is requesting a refill for cyanocobalamin (,VITAMIN B-12,) 1000 MCG/ML injection [id number] to be called into [facility]

## 2015-03-21 NOTE — Telephone Encounter (Signed)
Ok to fill 

## 2015-03-31 ENCOUNTER — Other Ambulatory Visit: Payer: Self-pay

## 2015-03-31 ENCOUNTER — Ambulatory Visit (HOSPITAL_COMMUNITY): Payer: Medicare Other | Attending: Cardiovascular Disease

## 2015-03-31 DIAGNOSIS — I119 Hypertensive heart disease without heart failure: Secondary | ICD-10-CM | POA: Diagnosis not present

## 2015-03-31 DIAGNOSIS — R011 Cardiac murmur, unspecified: Secondary | ICD-10-CM | POA: Diagnosis not present

## 2015-03-31 DIAGNOSIS — E785 Hyperlipidemia, unspecified: Secondary | ICD-10-CM | POA: Diagnosis not present

## 2015-03-31 DIAGNOSIS — R0600 Dyspnea, unspecified: Secondary | ICD-10-CM | POA: Diagnosis present

## 2015-03-31 DIAGNOSIS — Z87891 Personal history of nicotine dependence: Secondary | ICD-10-CM | POA: Diagnosis not present

## 2015-03-31 DIAGNOSIS — R0602 Shortness of breath: Secondary | ICD-10-CM | POA: Diagnosis not present

## 2015-03-31 DIAGNOSIS — E119 Type 2 diabetes mellitus without complications: Secondary | ICD-10-CM | POA: Diagnosis not present

## 2015-04-01 ENCOUNTER — Ambulatory Visit: Payer: Medicare Other | Admitting: Nurse Practitioner

## 2015-04-01 ENCOUNTER — Encounter: Payer: Self-pay | Admitting: Family Medicine

## 2015-04-01 ENCOUNTER — Ambulatory Visit (INDEPENDENT_AMBULATORY_CARE_PROVIDER_SITE_OTHER): Payer: Medicare Other | Admitting: Family Medicine

## 2015-04-01 VITALS — BP 118/60 | HR 82 | Temp 97.6°F | Wt 206.7 lb

## 2015-04-01 DIAGNOSIS — R059 Cough, unspecified: Secondary | ICD-10-CM

## 2015-04-01 DIAGNOSIS — R05 Cough: Secondary | ICD-10-CM

## 2015-04-01 DIAGNOSIS — K219 Gastro-esophageal reflux disease without esophagitis: Secondary | ICD-10-CM | POA: Diagnosis not present

## 2015-04-01 MED ORDER — OMEPRAZOLE 40 MG PO CPDR: 40.0000 mg | DELAYED_RELEASE_CAPSULE | Freq: Every day | ORAL | Status: DC

## 2015-04-01 MED ORDER — BENZONATATE 100 MG PO CAPS: 100.0000 mg | ORAL_CAPSULE | Freq: Two times a day (BID) | ORAL | Status: DC | PRN

## 2015-04-01 NOTE — Patient Instructions (Signed)
Food Choices for Gastroesophageal Reflux Disease, Adult When you have gastroesophageal reflux disease (GERD), the foods you eat and your eating habits are very important. Choosing the right foods can help ease the discomfort of GERD. WHAT GENERAL GUIDELINES DO I NEED TO FOLLOW?  Choose fruits, vegetables, whole grains, low-fat dairy products, and low-fat meat, fish, and poultry.  Limit fats such as oils, salad dressings, butter, nuts, and avocado.  Keep a food diary to identify foods that cause symptoms.  Avoid foods that cause reflux. These may be different for different people.  Eat frequent small meals instead of three large meals each day.  Eat your meals slowly, in a relaxed setting.  Limit fried foods.  Cook foods using methods other than frying.  Avoid drinking alcohol.  Avoid drinking large amounts of liquids with your meals.  Avoid bending over or lying down until 2-3 hours after eating. WHAT FOODS ARE NOT RECOMMENDED? The following are some foods and drinks that may worsen your symptoms: Vegetables Tomatoes. Tomato juice. Tomato and spaghetti sauce. Chili peppers. Onion and garlic. Horseradish. Fruits Oranges, grapefruit, and lemon (fruit and juice). Meats High-fat meats, fish, and poultry. This includes hot dogs, ribs, ham, sausage, salami, and bacon. Dairy Whole milk and chocolate milk. Sour cream. Cream. Butter. Ice cream. Cream cheese.  Beverages Coffee and tea, with or without caffeine. Carbonated beverages or energy drinks. Condiments Hot sauce. Barbecue sauce.  Sweets/Desserts Chocolate and cocoa. Donuts. Peppermint and spearmint. Fats and Oils High-fat foods, including French fries and potato chips. Other Vinegar. Strong spices, such as black pepper, white pepper, red pepper, cayenne, curry powder, cloves, ginger, and chili powder. The items listed above may not be a complete list of foods and beverages to avoid. Contact your dietitian for more  information.   This information is not intended to replace advice given to you by your health care provider. Make sure you discuss any questions you have with your health care provider.   Document Released: 12/18/2004 Document Revised: 01/08/2014 Document Reviewed: 10/22/2012 Elsevier Interactive Patient Education 2016 Elsevier Inc.  

## 2015-04-01 NOTE — Progress Notes (Signed)
Subjective:    Patient ID: Andres Smith, male    DOB: [birth year], 75 y.o.   MRN: [id number]  HPI  Andres Smith is a pleasant 75 y.o., who is here today complaining no week history of nonproductive cough.  He is a former smoker, she denies any associated chest pain, dyspnea, or wheezing. Cough seems to be worse at night, occasionally he has to sleep on the couch with his head elevated, which help. He denies orthopnea or PND. He denies any sick contacts. He is not sure about history of allergies but has noted rhinorrhea, nasal congestion, and sneezing. Symptom stable.  He has history of GERD, currently he is not taking any medication, he uses baking soda to help with heartburn and burning sensation in the back of his throat. He denies any abdominal pain nausea, vomiting, or changes in bowel habits. He has history of Crohn's disease and follows with gastroenterology. History of CAD, states that recently he saw his cardiologist.    Outpatient Encounter Prescriptions as of 04/01/2015  Medication Sig Note  . aspirin 81 MG tablet Take 81 mg by mouth daily.     Marland Kitchen atorvastatin (LIPITOR) 80 MG tablet Take 1 tablet (80 mg total) by mouth daily at 6 PM.   . carvedilol (COREG) 3.125 MG tablet Take 1 tablet (3.125 mg total) by mouth 2 (two) times daily with a meal.   . cyanocobalamin (,VITAMIN B-12,) 1000 MCG/ML injection INJECT 1ML ONCE MONTHLY.   . isosorbide mononitrate (IMDUR) 60 MG 24 hr tablet Take 1 tablet (60 mg total) by mouth daily.   Marland Kitchen losartan (COZAAR) 100 MG tablet Take 1 tablet (100 mg total) by mouth daily.   . mercaptopurine (PURINETHOL) 50 MG tablet Take 50 mg by mouth daily. Give on an empty stomach 1 hour before or 2 hours after meals. Caution: Chemotherapy.   . mesalamine (LIALDA) 1.2 g EC tablet Take 2.4 g by mouth daily with breakfast. Take 2 tablets by mouth daily with breakfast   . metFORMIN (GLUCOPHAGE) 500 MG tablet Take 1 tablet (500 mg total) by mouth 2 (two) times daily  with a meal.   . nitroGLYCERIN (NITROSTAT) 0.4 MG SL tablet Place 1 tablet (0.4 mg total) under the tongue every 5 (five) minutes as needed. For chest pain 12/31/2014: On Hand   . SYRINGE-NEEDLE, DISP, 3 ML (BD ECLIPSE SYRINGE) 25G X 5/8" 3 ML MISC Use with B12 injections. Inject once weekly x 3 weeks, then once montly                  Past Medical History  Diagnosis Date  . Hypertension   . Hyperlipidemia   . Diabetes mellitus   . Bradycardia   . Crohn's colitis ([place]) 12/01/1997  . Neck pain   . Dysphagia   . Ankylosing spondylitis ([place])   . Coronary artery disease     a. s/p CABG x 6 in 1997 (LIMA->LAD, VG->RI ->OM1->OM2, VG->PDA->PLV;  b. 05/2011 Cath:  patent grafs, native prox rca and d2 dzs  ->med rx.  . Pneumonia     hx of PNA  . Blood transfusion     hx of transfusion without reaction  . Kidney stones   . GERD (gastroesophageal reflux disease)   . History of skin cancer   . Heart block   . 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 Hiatal hernia 12/03/2008  . Hx of echocardiogram     Echo 5/14:  Mild LVH, EF 55-60%, NL  diast fxn, mild LAE   . Fatty liver   . Cholelithiasis    Social History   Social History Narrative   HSG, [employer] - Civil engineering/education in industrial, [place] from [place]. Married '64. 1 son '74, 1 dtr - '69; 1 grandchild, 1 on the way. 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 Lives with wife. ACP -              Review of Systems  Constitutional: Negative for fever, chills, activity change, appetite change and fatigue.  HENT: Positive for congestion, rhinorrhea and sneezing. Negative for ear pain, mouth sores, postnasal drip, sore throat, trouble swallowing and voice change.   Respiratory: Positive for cough. Negative for chest  tightness, shortness of breath and wheezing.   Cardiovascular: Positive for leg swelling. Negative for chest pain and palpitations.       LE edema at baseline. Negative for orthopnea or PND.  Gastrointestinal: Negative for nausea, vomiting, abdominal pain and diarrhea.       + heartburn and burning throat sensation  Musculoskeletal: Negative for back pain, joint swelling and neck pain.  Skin: Negative for rash.  Neurological: Negative for weakness and headaches.   Filed Vitals:   04/01/15 0910  Weight: 206 lb 11.2 oz (93.759 kg)   Filed Vitals:   04/01/15 0910  BP: 118/60  Pulse: 82  Temp: 97.6 F (36.4 C)       Objective:   Physical Exam  Constitutional: He is oriented to person, place, and time. He appears well-developed and well-nourished. No distress.  HENT:  Head: Normocephalic.  Right Ear: Tympanic membrane, external ear and ear canal normal.  Left Ear: Tympanic membrane, external ear and ear canal normal.  Nose: No rhinorrhea. Right sinus exhibits no maxillary sinus tenderness and no frontal sinus tenderness. Left sinus exhibits no maxillary sinus tenderness and no frontal sinus tenderness.  Mouth/Throat: Uvula is midline, oropharynx is clear and moist and mucous membranes are normal. No oral lesions. No oropharyngeal exudate.  Eyes: Conjunctivae are normal. Right eye exhibits no discharge. Left eye exhibits no discharge.  Cardiovascular: Normal rate.  An irregular rhythm present.  Occasional extrasystoles are present.  Murmur heard.  Systolic murmur is present  I-II/VI, RUSB>LUSB. 1+ pitting edema LE bilateral, no erythema or calves tenderness.  Pulmonary/Chest: Effort normal and breath sounds normal. No respiratory distress. He has no wheezes. He has no rales.  Non productive cough, occasionally, during examination  Abdominal: Soft. He exhibits no distension and no mass. There is no tenderness.  Musculoskeletal:  Cervical spine with mark limitation ROM, chronic and  reported as stable  Lymphadenopathy:    He has no cervical adenopathy.  Neurological: He is alert and oriented to person, place, and time. Coordination normal.  Stable gait w/o assistance  Skin: Skin is warm.  Psychiatric: He has a normal mood and affect.  Nursing note and vitals reviewed.       Assessment & Plan:    1. Cough We discussed possible causes of cough; including allergies, cardiovascular, lung disease, and gastrointestinal. Since examination today is benign + no fever + recent onset I do not think CXR is needed today but needs to be considered if symptom persist. I am thinking GERD is most likely the cause.  If in 2 weeks cough is not any better, he was  instructed to let me know, so I can order a CXR. He would like something to help with cough, so Benzonatate recommended to use as needed for a couple of days. F/U in 4 weeks with pcp.  Instructed about warning signs.  - benzonatate (TESSALON) 100 MG capsule; Take 1 capsule (100 mg total) by mouth 2 (two) times daily as needed for cough.  Dispense: 20 capsule; Refill: 0  2. Gastroesophageal reflux disease without esophagitis I recommended a PPI trial, Omeprazole. GERD diet discussed. Some side effects of PPI's discussed, recommended taking it for 4-6 weeks.   - omeprazole (PRILOSEC) 40 MG capsule; Take 1 capsule (40 mg total) by mouth daily. 30 min before breakfast.  Dispense: 30 capsule; Refill: 1  Note: CV findings on examination were discussed, according to pt, he does have a heart murmur and started with irregular HR a couple months ago, he tells me that this was recently addressed with his cardiologists.   Betty Martinique, MD

## 2015-04-01 NOTE — Progress Notes (Signed)
Pre visit review using our clinic review tool, if applicable. No additional management support is needed unless otherwise documented below in the visit note. 

## 2015-04-19 ENCOUNTER — Other Ambulatory Visit: Payer: Self-pay | Admitting: Internal Medicine

## 2015-04-23 ENCOUNTER — Other Ambulatory Visit: Payer: Self-pay | Admitting: Internal Medicine

## 2015-05-09 ENCOUNTER — Ambulatory Visit: Payer: Medicare Other | Admitting: Internal Medicine

## 2015-05-16 ENCOUNTER — Ambulatory Visit: Payer: Medicare Other | Admitting: Internal Medicine

## 2015-05-16 ENCOUNTER — Other Ambulatory Visit (INDEPENDENT_AMBULATORY_CARE_PROVIDER_SITE_OTHER): Payer: Medicare Other

## 2015-05-16 ENCOUNTER — Ambulatory Visit (INDEPENDENT_AMBULATORY_CARE_PROVIDER_SITE_OTHER): Payer: Medicare Other | Admitting: Internal Medicine

## 2015-05-16 VITALS — BP 140/60 | HR 56 | Temp 97.6°F | Resp 16 | Ht 68.0 in | Wt 208.4 lb

## 2015-05-16 DIAGNOSIS — I2510 Atherosclerotic heart disease of native coronary artery without angina pectoris: Secondary | ICD-10-CM | POA: Diagnosis not present

## 2015-05-16 DIAGNOSIS — E118 Type 2 diabetes mellitus with unspecified complications: Secondary | ICD-10-CM | POA: Diagnosis not present

## 2015-05-16 DIAGNOSIS — L570 Actinic keratosis: Secondary | ICD-10-CM | POA: Diagnosis not present

## 2015-05-16 DIAGNOSIS — Z85828 Personal history of other malignant neoplasm of skin: Secondary | ICD-10-CM | POA: Diagnosis not present

## 2015-05-16 DIAGNOSIS — D519 Vitamin B12 deficiency anemia, unspecified: Secondary | ICD-10-CM

## 2015-05-16 DIAGNOSIS — E785 Hyperlipidemia, unspecified: Secondary | ICD-10-CM

## 2015-05-16 DIAGNOSIS — I10 Essential (primary) hypertension: Secondary | ICD-10-CM

## 2015-05-16 DIAGNOSIS — D0439 Carcinoma in situ of skin of other parts of face: Secondary | ICD-10-CM | POA: Diagnosis not present

## 2015-05-16 DIAGNOSIS — D485 Neoplasm of uncertain behavior of skin: Secondary | ICD-10-CM | POA: Diagnosis not present

## 2015-05-16 LAB — CBC WITH DIFFERENTIAL/PLATELET
BASOS ABS: 0 10*3/uL (ref 0.0–0.1)
Basophils Relative: 0.3 % (ref 0.0–3.0)
EOS ABS: 0.1 10*3/uL (ref 0.0–0.7)
Eosinophils Relative: 2 % (ref 0.0–5.0)
HCT: 37.8 % — ABNORMAL LOW (ref 39.0–52.0)
HEMOGLOBIN: 13.1 g/dL (ref 13.0–17.0)
Lymphocytes Relative: 21.5 % (ref 12.0–46.0)
Lymphs Abs: 1.1 10*3/uL (ref 0.7–4.0)
MCHC: 34.5 g/dL (ref 30.0–36.0)
MCV: 104.7 fl — ABNORMAL HIGH (ref 78.0–100.0)
MONO ABS: 0.5 10*3/uL (ref 0.1–1.0)
Monocytes Relative: 9.9 % (ref 3.0–12.0)
Neutro Abs: 3.5 10*3/uL (ref 1.4–7.7)
Neutrophils Relative %: 66.3 % (ref 43.0–77.0)
Platelets: 176 10*3/uL (ref 150.0–400.0)
RBC: 3.61 Mil/uL — AB (ref 4.22–5.81)
RDW: 16.5 % — AB (ref 11.5–15.5)
WBC: 5.3 10*3/uL (ref 4.0–10.5)

## 2015-05-16 LAB — COMPREHENSIVE METABOLIC PANEL
ALBUMIN: 4.3 g/dL (ref 3.5–5.2)
ALK PHOS: 53 U/L (ref 39–117)
ALT: 18 U/L (ref 0–53)
AST: 18 U/L (ref 0–37)
BILIRUBIN TOTAL: 0.8 mg/dL (ref 0.2–1.2)
BUN: 16 mg/dL (ref 6–23)
CALCIUM: 9.1 mg/dL (ref 8.4–10.5)
CO2: 29 mEq/L (ref 19–32)
CREATININE: 0.88 mg/dL (ref 0.40–1.50)
Chloride: 105 mEq/L (ref 96–112)
GFR: 89.57 mL/min (ref >60.00)
Glucose, Bld: 147 mg/dL — ABNORMAL HIGH (ref 70–99)
Potassium: 4.2 mEq/L (ref 3.5–5.1)
SODIUM: 141 meq/L (ref 135–145)
TOTAL PROTEIN: 6.9 g/dL (ref 6.0–8.3)

## 2015-05-16 LAB — LIPID PANEL
CHOLESTEROL: 136 mg/dL (ref 0–200)
HDL: 58.1 mg/dL (ref >39.00)
LDL Cholesterol: 60 mg/dL (ref 0–99)
NonHDL: 77.97
TRIGLYCERIDES: 90 mg/dL (ref 0.0–149.0)
Total CHOL/HDL Ratio: 2
VLDL: 18 mg/dL (ref 0.0–40.0)

## 2015-05-16 LAB — URINALYSIS, ROUTINE W REFLEX MICROSCOPIC
BILIRUBIN URINE: NEGATIVE
Hgb urine dipstick: NEGATIVE
Ketones, ur: NEGATIVE
LEUKOCYTES UA: NEGATIVE
Nitrite: NEGATIVE
PH: 5.5 (ref 5.0–8.0)
Specific Gravity, Urine: 1.025 (ref 1.000–1.030)
TOTAL PROTEIN, URINE-UPE24: 30 — AB
UROBILINOGEN UA: 0.2 (ref 0.0–1.0)
Urine Glucose: NEGATIVE
WBC, UA: NONE SEEN (ref 0–?)

## 2015-05-16 LAB — MICROALBUMIN / CREATININE URINE RATIO
Creatinine,U: 121.8 mg/dL
MICROALB/CREAT RATIO: 10.8 mg/g (ref 0.0–30.0)
Microalb, Ur: 13.2 mg/dL — ABNORMAL HIGH (ref 0.0–1.9)

## 2015-05-16 LAB — HEMOGLOBIN A1C: HEMOGLOBIN A1C: 7.3 % — AB (ref 4.6–6.5)

## 2015-05-16 LAB — TSH: TSH: 2.34 u[IU]/mL (ref 0.35–4.50)

## 2015-05-16 NOTE — Progress Notes (Signed)
Subjective:  Patient ID: Andres Smith, male    DOB: Aug 04, 1940  Age: 75 y.o. MRN: [id number]  CC: Follow-up; Hypertension; Hyperlipidemia; and Diabetes   HPI LAIM BOOGAARD presents for follow-up on multiple medical problems. He tells me his blood sugars have been well controlled and he denies polyuria, polydipsia, or polyphagia. His blood pressure has been well controlled with losartan and carvedilol. He has had no episodes of chest pain, shortness of breath, edema, palpitations, or near-syncope.  Outpatient Prescriptions Prior to Visit  Medication Sig Dispense Refill  . aspirin 81 MG tablet Take 81 mg by mouth daily.      Marland Kitchen atorvastatin (LIPITOR) 80 MG tablet Take 1 tablet (80 mg total) by mouth daily at 6 PM. 90 tablet 3  . carvedilol (COREG) 3.125 MG tablet Take 1 tablet (3.125 mg total) by mouth 2 (two) times daily with a meal. 180 tablet 3  . cyanocobalamin (,VITAMIN B-12,) 1000 MCG/ML injection INJECT 1ML ONCE MONTHLY. 10 mL 1  . isosorbide mononitrate (IMDUR) 60 MG 24 hr tablet Take 1 tablet (60 mg total) by mouth daily. 90 tablet 3  . losartan (COZAAR) 100 MG tablet Take 1 tablet (100 mg total) by mouth daily. 90 tablet 3  . mercaptopurine (PURINETHOL) 50 MG tablet Take 50 mg by mouth daily. Give on an empty stomach 1 hour before or 2 hours after meals. Caution: Chemotherapy.    . mesalamine (LIALDA) 1.2 g EC tablet Take 2.4 g by mouth daily with breakfast. Take 2 tablets by mouth daily with breakfast    . metFORMIN (GLUCOPHAGE) 500 MG tablet Take 1 tablet (500 mg total) by mouth 2 (two) times daily with a meal. Must Keep 05/16/15 appt for future refills 180 tablet 0  . nitroGLYCERIN (NITROSTAT) 0.4 MG SL tablet Place 1 tablet (0.4 mg total) under the tongue every 5 (five) minutes as needed. For chest pain 25 tablet 3  . SYRINGE-NEEDLE, DISP, 3 ML (BD ECLIPSE SYRINGE) 25G X 5/8" 3 ML MISC Use with B12 injections. Inject once weekly x 3 weeks, then once montly 16 each 0  . benzonatate  (TESSALON) 100 MG capsule Take 1 capsule (100 mg total) by mouth 2 (two) times daily as needed for cough. (Patient not taking: Reported on 05/16/2015) 20 capsule 0  . omeprazole (PRILOSEC) 40 MG capsule Take 1 capsule (40 mg total) by mouth daily. 30 min before breakfast. (Patient not taking: Reported on 05/16/2015) 30 capsule 1   No facility-administered medications prior to visit.    ROS Review of Systems  Constitutional: Negative.  Negative for fever, chills, diaphoresis, appetite change and fatigue.  HENT: Negative.   Eyes: Negative.   Respiratory: Negative.  Negative for cough, choking, chest tightness, shortness of breath and stridor.   Cardiovascular: Negative.  Negative for chest pain, palpitations and leg swelling.  Gastrointestinal: Negative.  Negative for nausea, vomiting, abdominal pain, diarrhea and constipation.  Endocrine: Negative.  Negative for polydipsia, polyphagia and polyuria.  Genitourinary: Negative.  Negative for urgency, hematuria, decreased urine volume and difficulty urinating.  Musculoskeletal: Negative.  Negative for myalgias, back pain, joint swelling and arthralgias.  Skin: Negative.  Negative for color change and rash.  Allergic/Immunologic: Negative.   Neurological: Negative.  Negative for dizziness.  Hematological: Negative.  Negative for adenopathy. Does not bruise/bleed easily.  Psychiatric/Behavioral: Negative.     Objective:  BP 140/60 mmHg  Pulse 56  Temp(Src) 97.6 F (36.4 C) (Oral)  Resp 16  Ht 5\' 8"  (1.727 m)  Wt 208 lb 6.4 oz (94.53 kg)  BMI 31.69 kg/m2  SpO2 97%  BP Readings from Last 3 Encounters:  05/16/15 140/60  04/01/15 118/60  03/16/15 132/58    Wt Readings from Last 3 Encounters:  05/16/15 208 lb 6.4 oz (94.53 kg)  04/01/15 206 lb 11.2 oz (93.759 kg)  03/16/15 210 lb 12.8 oz (95.618 kg)    Physical Exam  Constitutional: He is oriented to person, place, and time. No distress.  HENT:  Mouth/Throat: Oropharynx is clear  and moist. No oropharyngeal exudate.  Eyes: Conjunctivae are normal. Right eye exhibits no discharge. Left eye exhibits no discharge. No scleral icterus.  Neck: Normal range of motion. Neck supple. No JVD present. No tracheal deviation present. No thyromegaly present.  Cardiovascular: Normal rate, regular rhythm and intact distal pulses.  Exam reveals no gallop and no friction rub.   Murmur heard.  Systolic murmur is present with a grade of 1/6   No diastolic murmur is present  Pulmonary/Chest: Effort normal and breath sounds normal. No stridor. No respiratory distress. He has no wheezes. He has no rales. He exhibits no tenderness.  Abdominal: Soft. Bowel sounds are normal. He exhibits no distension and no mass. There is no tenderness. There is no rebound and no guarding.  Musculoskeletal: Normal range of motion. He exhibits edema (trace edema in both lower extremities). He exhibits no tenderness.  Lymphadenopathy:    He has no cervical adenopathy.  Neurological: He is oriented to person, place, and time.  Skin: Skin is warm and dry. No rash noted. He is not diaphoretic. No erythema. No pallor.  Vitals reviewed.   Lab Results  Component Value Date   WBC 5.3 05/16/2015   HGB 13.1 05/16/2015   HCT 37.8* 05/16/2015   PLT 176.0 05/16/2015   GLUCOSE 147* 05/16/2015   CHOL 136 05/16/2015   TRIG 90.0 05/16/2015   HDL 58.10 05/16/2015   LDLCALC 60 05/16/2015   ALT 18 05/16/2015   AST 18 05/16/2015   NA 141 05/16/2015   K 4.2 05/16/2015   CL 105 05/16/2015   CREATININE 0.88 05/16/2015   BUN 16 05/16/2015   CO2 29 05/16/2015   TSH 2.34 05/16/2015   PSA 2.29 05/06/2012   INR 0.9 ratio 12/02/2008   HGBA1C 7.3* 05/16/2015   MICROALBUR 13.2* 05/16/2015    Dg Chest 2 View  11/08/2014  CLINICAL DATA:  Followup rib fractures, fell 10 days ago EXAM: CHEST  2 VIEW COMPARISON:  Left rib detail of 10/31/2014 FINDINGS: No active infiltrate or effusion is seen. No pneumothorax is seen.  Mediastinal and hilar contours are unchanged. Mild cardiomegaly is stable, and median sternotomy sutures are noted from prior CABG. The bones are diffusely osteopenic. Compared to the left rib detail films, there has been no change in the nondisplaced fracture of the anterior left seventh rib. No other acute abnormality is seen. IMPRESSION: 1. No change in the nondisplaced anterior left seventh rib fracture. 2. No active infiltrate or effusion. Electronically Signed   By: Ivar Drape M.D.   On: 11/08/2014 16:24   Dg Shoulder Right  11/08/2014  CLINICAL DATA:  Persistent right shoulder pain, status post fall 10 days ago. EXAM: RIGHT SHOULDER - 2+ VIEW COMPARISON:  Chest x-ray of December 26, 2006 which included portions of the right shoulder. FINDINGS: The bones are reasonably well mineralized. The glenohumeral joint is intact. No acute humeral head or neck fracture is observed. There is irregularity of the [facility] joint with some spurring  of the articular margins of the acromion and clavicle. There is a prominent subacromial spur. The observed portions of the right clavicle and upper right ribs are normal. IMPRESSION: There are degenerative changes centered on the [facility] joint. There is a prominent subacromial spur. No acute glenohumeral abnormality is observed and there is no acute fracture nor dislocation. Electronically Signed   By: David  Martinique M.D.   On: 11/08/2014 16:23    Assessment & Plan:   Liev was seen today for follow-up, hypertension, hyperlipidemia and diabetes.  Diagnoses and all orders for this visit:  Atherosclerosis of native coronary artery of native heart without angina pectoris- he has had no recent episodes of chest pain or shortness of breath, he is achieved his LDL goal, will continue risk factor modifications with statin therapy, her pressure control, and blood sugar control. -     Lipid panel; Future  Essential hypertension, benign- his blood pressure is well-controlled, electrolytes  and renal function are stable. -     Comprehensive metabolic panel; Future -     Urinalysis, Routine w reflex microscopic (not at [facility]); Future  Type 2 diabetes mellitus with complication, without long-term current use of insulin ([place])- his A1c is up to 7.3%, this is still adequately well controlled, will continue metformin and he agrees to work on his lifestyle modifications with diet/exercise/weight loss. -     Lipid panel; Future -     Comprehensive metabolic panel; Future -     Hemoglobin A1c; Future -     Microalbumin / creatinine urine ratio; Future  Hyperlipidemia with target LDL less than 70- he is achieved his LDL goal is doing well on statin therapy. -     Lipid panel; Future -     Comprehensive metabolic panel; Future -     TSH; Future  B12 deficiency anemia- continue monthly B12 injections. -     CBC with Differential/Platelet; Future  I am having Mr. Bergeson maintain his aspirin, SYRINGE-NEEDLE (DISP) 3 ML, nitroGLYCERIN, isosorbide mononitrate, mesalamine, mercaptopurine, carvedilol, atorvastatin, losartan, cyanocobalamin, omeprazole, benzonatate, and metFORMIN.  No orders of the defined types were placed in this encounter.     Follow-up: Return in about 6 months (around 11/16/2015).  [name], MD

## 2015-05-16 NOTE — Progress Notes (Signed)
Pre visit review using our clinic review tool, if applicable. No additional management support is needed unless otherwise documented below in the visit note. 

## 2015-05-16 NOTE — Patient Instructions (Signed)

## 2015-05-17 ENCOUNTER — Encounter: Payer: Self-pay | Admitting: Internal Medicine

## 2015-06-13 ENCOUNTER — Other Ambulatory Visit: Payer: Self-pay | Admitting: Internal Medicine

## 2015-07-12 ENCOUNTER — Other Ambulatory Visit: Payer: Self-pay | Admitting: Internal Medicine

## 2015-07-20 ENCOUNTER — Telehealth: Payer: Self-pay

## 2015-07-20 ENCOUNTER — Telehealth: Payer: Self-pay | Admitting: Internal Medicine

## 2015-07-20 NOTE — Telephone Encounter (Signed)
Left message for pt to call back  °

## 2015-07-21 ENCOUNTER — Other Ambulatory Visit: Payer: Self-pay | Admitting: Internal Medicine

## 2015-07-21 NOTE — Telephone Encounter (Signed)
Discussed with pt that there are no sooner appts with Dr. Hilarie Fredrickson. He is not having any problems just concerned due to med refills. Explained that all he needs to do is contact his pharmacy and since he has an appt scheduled we will refill his meds. Pt knows to call back if he has any issues prior to OV.

## 2015-08-05 ENCOUNTER — Other Ambulatory Visit: Payer: Self-pay | Admitting: Cardiovascular Disease

## 2015-08-16 DIAGNOSIS — D0471 Carcinoma in situ of skin of right lower limb, including hip: Secondary | ICD-10-CM | POA: Diagnosis not present

## 2015-08-16 DIAGNOSIS — D485 Neoplasm of uncertain behavior of skin: Secondary | ICD-10-CM | POA: Diagnosis not present

## 2015-08-16 DIAGNOSIS — Z85828 Personal history of other malignant neoplasm of skin: Secondary | ICD-10-CM | POA: Diagnosis not present

## 2015-08-16 DIAGNOSIS — L578 Other skin changes due to chronic exposure to nonionizing radiation: Secondary | ICD-10-CM | POA: Diagnosis not present

## 2015-08-16 DIAGNOSIS — D0439 Carcinoma in situ of skin of other parts of face: Secondary | ICD-10-CM | POA: Diagnosis not present

## 2015-08-16 DIAGNOSIS — L821 Other seborrheic keratosis: Secondary | ICD-10-CM | POA: Diagnosis not present

## 2015-08-16 DIAGNOSIS — L570 Actinic keratosis: Secondary | ICD-10-CM | POA: Diagnosis not present

## 2015-08-25 DIAGNOSIS — Z85828 Personal history of other malignant neoplasm of skin: Secondary | ICD-10-CM | POA: Diagnosis not present

## 2015-08-25 DIAGNOSIS — C44329 Squamous cell carcinoma of skin of other parts of face: Secondary | ICD-10-CM | POA: Diagnosis not present

## 2015-09-08 ENCOUNTER — Other Ambulatory Visit: Payer: Self-pay | Admitting: Physician Assistant

## 2015-09-08 DIAGNOSIS — I2510 Atherosclerotic heart disease of native coronary artery without angina pectoris: Secondary | ICD-10-CM

## 2015-10-03 ENCOUNTER — Encounter: Payer: Self-pay | Admitting: Internal Medicine

## 2015-10-03 ENCOUNTER — Other Ambulatory Visit (INDEPENDENT_AMBULATORY_CARE_PROVIDER_SITE_OTHER): Payer: Medicare Other

## 2015-10-03 ENCOUNTER — Encounter (INDEPENDENT_AMBULATORY_CARE_PROVIDER_SITE_OTHER): Payer: Self-pay

## 2015-10-03 ENCOUNTER — Ambulatory Visit (INDEPENDENT_AMBULATORY_CARE_PROVIDER_SITE_OTHER): Payer: Medicare Other | Admitting: Internal Medicine

## 2015-10-03 VITALS — BP 120/64 | HR 47 | Ht 68.0 in | Wt 203.0 lb

## 2015-10-03 DIAGNOSIS — K5190 Ulcerative colitis, unspecified, without complications: Secondary | ICD-10-CM

## 2015-10-03 DIAGNOSIS — I2510 Atherosclerotic heart disease of native coronary artery without angina pectoris: Secondary | ICD-10-CM

## 2015-10-03 DIAGNOSIS — E538 Deficiency of other specified B group vitamins: Secondary | ICD-10-CM

## 2015-10-03 DIAGNOSIS — K219 Gastro-esophageal reflux disease without esophagitis: Secondary | ICD-10-CM | POA: Diagnosis not present

## 2015-10-03 LAB — CBC WITH DIFFERENTIAL/PLATELET
BASOS ABS: 0 10*3/uL (ref 0.0–0.1)
Basophils Relative: 0.5 % (ref 0.0–3.0)
EOS ABS: 0.1 10*3/uL (ref 0.0–0.7)
Eosinophils Relative: 2.2 % (ref 0.0–5.0)
HEMATOCRIT: 37.3 % — AB (ref 39.0–52.0)
HEMOGLOBIN: 12.8 g/dL — AB (ref 13.0–17.0)
LYMPHS PCT: 24.2 % (ref 12.0–46.0)
Lymphs Abs: 1 10*3/uL (ref 0.7–4.0)
MCHC: 34.2 g/dL (ref 30.0–36.0)
MCV: 106 fl — ABNORMAL HIGH (ref 78.0–100.0)
MONO ABS: 0.3 10*3/uL (ref 0.1–1.0)
Monocytes Relative: 7.4 % (ref 3.0–12.0)
Neutro Abs: 2.7 10*3/uL (ref 1.4–7.7)
Neutrophils Relative %: 65.7 % (ref 43.0–77.0)
Platelets: 164 10*3/uL (ref 150.0–400.0)
RBC: 3.52 Mil/uL — AB (ref 4.22–5.81)
RDW: 15.4 % (ref 11.5–15.5)
WBC: 4.1 10*3/uL (ref 4.0–10.5)

## 2015-10-03 LAB — COMPREHENSIVE METABOLIC PANEL
ALBUMIN: 3.9 g/dL (ref 3.5–5.2)
ALT: 14 U/L (ref 0–53)
AST: 19 U/L (ref 0–37)
Alkaline Phosphatase: 44 U/L (ref 39–117)
BILIRUBIN TOTAL: 1.4 mg/dL — AB (ref 0.2–1.2)
BUN: 12 mg/dL (ref 6–23)
CHLORIDE: 104 meq/L (ref 96–112)
CO2: 32 mEq/L (ref 19–32)
CREATININE: 0.89 mg/dL (ref 0.40–1.50)
Calcium: 8.8 mg/dL (ref 8.4–10.5)
GFR: 88.32 mL/min (ref >60.00)
Glucose, Bld: 129 mg/dL — ABNORMAL HIGH (ref 70–99)
Potassium: 4.6 mEq/L (ref 3.5–5.1)
SODIUM: 140 meq/L (ref 135–145)
Total Protein: 6.6 g/dL (ref 6.0–8.3)

## 2015-10-03 LAB — VITAMIN B12: VITAMIN B 12: 510 pg/mL (ref 211–911)

## 2015-10-03 MED ORDER — MERCAPTOPURINE 50 MG PO TABS: ORAL_TABLET | ORAL | 2 refills | Status: DC

## 2015-10-03 MED ORDER — MESALAMINE 1.2 G PO TBEC: DELAYED_RELEASE_TABLET | ORAL | 2 refills | Status: DC

## 2015-10-03 NOTE — Progress Notes (Signed)
Subjective:    Patient ID: Andres Smith, male    DOB: Mar 06, 1940, 75 y.o.   MRN: [id number]  HPI [name] is a 75 year old male with long-standing panel of colitis and ankylosing spondylitis, CAD status post CABG, hypertension, hyperlipidemia, diabetes, GERD who is here for follow-up. Last seen in the office in December 2016 became for surveillance colonoscopy on 02/04/2015. 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 In the past when they try to discontinue 6-MP he had a flare of his disease. Colonoscopy to the cecum revealed normal colon without evidence of active colitis. There was evidence of chronic changes including loss of normal haustral folds. Surveillance biopsies were performed which were normal without dysplasia.  He reports he is doing well. He's not having any abdominal pain or blood in his stool. He does occasionally and rarely have loose stools or diarrhea which he blames on dietary indiscretion. He has borborygmi and belching but denies feeling bloated. Heartburn is well controlled with omeprazole 40 mg daily. Rarely he'll use baking soda and water at night for breakthrough. He denies dysphagia or odynophagia. He continues his B12 injections monthly. He denies chest pain and dyspnea. No recent fevers chills or night sweats.  He remains active and plays golf on a regular basis  Review of Systems As per history of present illness, otherwise negative  Current Medications, Allergies, Past Medical History, Past Surgical History, Family History and Social History were reviewed in [employer] record.     Objective:   Physical Exam BP 120/64   Pulse (!) 47   Ht 5\' 8"  (1.727 m)   Wt 203 lb (92.1 kg)   SpO2 97%   BMI 30.87 kg/m  Constitutional: Well-developed and well-nourished. No distress. HEENT: Normocephalic and atraumatic. Oropharynx is clear and moist. No oropharyngeal exudate. Conjunctivae are normal.  No scleral  icterus. Neck: Neck supple. Trachea midline.Limited mobility with flexion, extension and lateral movement Cardiovascular: Normal rate, regular rhythm and intact distal pulses. No M/R/G Pulmonary/chest: Effort normal and breath sounds normal. No wheezing, rales or rhonchi. Abdominal: Soft, obese, nontender, nondistended. Bowel sounds active throughout. There are no masses palpable. No hepatosplenomegaly. Extremities: no clubbing, cyanosis, or edema Lymphadenopathy: No cervical adenopathy noted. Neurological: Alert and oriented to person place and time. Skin: Skin is warm and dry. No rashes noted. Psychiatric: Normal mood and affect. Behavior is normal.  CBC    Component Value Date/Time   WBC 5.3 05/16/2015 1621   RBC 3.61 (L) 05/16/2015 1621   HGB 13.1 05/16/2015 1621   HCT 37.8 (L) 05/16/2015 1621   PLT 176.0 05/16/2015 1621   MCV 104.7 (H) 05/16/2015 1621   MCH 32.1 05/25/2011 1210   MCHC 34.5 05/16/2015 1621   RDW 16.5 (H) 05/16/2015 1621   LYMPHSABS 1.1 05/16/2015 1621   MONOABS 0.5 05/16/2015 1621   EOSABS 0.1 05/16/2015 1621   BASOSABS 0.0 05/16/2015 1621   CMP     Component Value Date/Time   NA 141 05/16/2015 1621   K 4.2 05/16/2015 1621   CL 105 05/16/2015 1621   CO2 29 05/16/2015 1621   GLUCOSE 147 (H) 05/16/2015 1621   GLUCOSE 98 12/12/2005 0729   BUN 16 05/16/2015 1621   CREATININE 0.88 05/16/2015 1621   CREATININE 0.96 12/03/2014 0833   CALCIUM 9.1 05/16/2015 1621   PROT 6.9 05/16/2015 1621   ALBUMIN 4.3 05/16/2015 1621   AST 18 05/16/2015 1621   ALT 18 05/16/2015 1621  ALKPHOS 53 05/16/2015 1621   BILITOT 0.8 05/16/2015 1621   GFRNONAA 82 (L) 05/25/2011 1210   GFRAA >90 05/25/2011 1210       Assessment & Plan:   75 year old male with long-standing panel of colitis and ankylosing spondylitis, CAD status post CABG, hypertension, hyperlipidemia, diabetes, GERD who is here for follow-up.  1. 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 Prior attempts at discontinuation of 6-MP lead to flare of his disease. We will continue Lialda 2.4 g daily and 6-MP at 50 mg daily. Check CBC, CMP and B12 levels today.  2. B12 deficiency -- continue monthly IM B12. Check B12 today  3. GERD -- stable without alarm symptom. He prefers to continue omeprazole which we will do at 40 mg 30 minutes before breakfast each day.  Annual follow-up, sooner if necessary 15 minutes spent with the patient today. Greater than 50% was spent in counseling and coordination of care with the patient

## 2015-10-03 NOTE — Patient Instructions (Signed)
Continue B12 injections.  We have sent the following prescriptions to your mail in pharmacy: AG mesalamine (authorized generic for Lialda) Mercaptopurine  If you have not heard from your mail in pharmacy within 1 week or if you have not received your medication in the mail, please contact us at [phone] so we may find out why.  Your physician has requested that you go to the basement for the following lab work before leaving today: CBC, CMP, B12  Please follow up with Dr Hilarie Fredrickson in 1 year.  If you are age 76 or older, your body mass index should be between 23-30. Your Body mass index is 30.87 kg/m. If this is out of the aforementioned range listed, please consider follow up with your Primary Care Provider.  If you are age 11 or younger, your body mass index should be between 19-25. Your Body mass index is 30.87 kg/m. If this is out of the aformentioned range listed, please consider follow up with your Primary Care Provider.

## 2015-10-04 ENCOUNTER — Other Ambulatory Visit: Payer: Self-pay

## 2015-10-04 DIAGNOSIS — R17 Unspecified jaundice: Secondary | ICD-10-CM

## 2015-10-04 DIAGNOSIS — D649 Anemia, unspecified: Secondary | ICD-10-CM

## 2015-10-04 DIAGNOSIS — E538 Deficiency of other specified B group vitamins: Secondary | ICD-10-CM

## 2015-10-14 ENCOUNTER — Encounter: Payer: Self-pay | Admitting: *Deleted

## 2015-10-14 ENCOUNTER — Telehealth: Payer: Self-pay | Admitting: Internal Medicine

## 2015-10-14 NOTE — Telephone Encounter (Signed)
I have left a voicemail to call back. 

## 2015-10-14 NOTE — Telephone Encounter (Signed)
I have explained that Lialda does now have a generic. I did place a note on the rx I sent to [facility] that patient is to be provided with AG mesalamine for Lialda. They still sent name brand Lialda. Wife states she is sending the medication back for a refund.

## 2015-10-17 ENCOUNTER — Telehealth: Payer: Self-pay

## 2015-10-17 ENCOUNTER — Other Ambulatory Visit (INDEPENDENT_AMBULATORY_CARE_PROVIDER_SITE_OTHER): Payer: Medicare Other

## 2015-10-17 DIAGNOSIS — E538 Deficiency of other specified B group vitamins: Secondary | ICD-10-CM

## 2015-10-17 DIAGNOSIS — R17 Unspecified jaundice: Secondary | ICD-10-CM | POA: Diagnosis not present

## 2015-10-17 DIAGNOSIS — D649 Anemia, unspecified: Secondary | ICD-10-CM | POA: Diagnosis not present

## 2015-10-17 LAB — COMPREHENSIVE METABOLIC PANEL
ALT: 10 U/L (ref 0–53)
AST: 13 U/L (ref 0–37)
Albumin: 4.2 g/dL (ref 3.5–5.2)
Alkaline Phosphatase: 41 U/L (ref 39–117)
BILIRUBIN TOTAL: 1 mg/dL (ref 0.2–1.2)
BUN: 14 mg/dL (ref 6–23)
CALCIUM: 9 mg/dL (ref 8.4–10.5)
CHLORIDE: 103 meq/L (ref 96–112)
CO2: 29 meq/L (ref 19–32)
CREATININE: 0.94 mg/dL (ref 0.40–1.50)
GFR: 82.91 mL/min (ref >60.00)
GLUCOSE: 174 mg/dL — AB (ref 70–99)
Potassium: 4.3 mEq/L (ref 3.5–5.1)
SODIUM: 138 meq/L (ref 135–145)
Total Protein: 6.7 g/dL (ref 6.0–8.3)

## 2015-10-17 LAB — FOLATE: FOLATE: 12.4 ng/mL (ref >5.9)

## 2015-10-17 NOTE — Telephone Encounter (Signed)
-----   Message from Algernon Huxley, RN sent at 10/04/2015  9:51 AM EDT ----- Regarding: Labs Pt needs labs in 2 weeks, orders in epic.

## 2015-10-17 NOTE — Telephone Encounter (Signed)
Pt aware.

## 2015-11-09 DIAGNOSIS — Z23 Encounter for immunization: Secondary | ICD-10-CM | POA: Diagnosis not present

## 2015-11-17 DIAGNOSIS — C44619 Basal cell carcinoma of skin of left upper limb, including shoulder: Secondary | ICD-10-CM | POA: Diagnosis not present

## 2015-11-17 DIAGNOSIS — Z85828 Personal history of other malignant neoplasm of skin: Secondary | ICD-10-CM | POA: Diagnosis not present

## 2015-11-17 DIAGNOSIS — L821 Other seborrheic keratosis: Secondary | ICD-10-CM | POA: Diagnosis not present

## 2015-11-17 DIAGNOSIS — L570 Actinic keratosis: Secondary | ICD-10-CM | POA: Diagnosis not present

## 2015-11-17 DIAGNOSIS — D485 Neoplasm of uncertain behavior of skin: Secondary | ICD-10-CM | POA: Diagnosis not present

## 2015-11-17 DIAGNOSIS — H61002 Unspecified perichondritis of left external ear: Secondary | ICD-10-CM | POA: Diagnosis not present

## 2015-12-02 ENCOUNTER — Telehealth: Payer: Self-pay | Admitting: Internal Medicine

## 2015-12-02 NOTE — Telephone Encounter (Signed)
Called Andres Smith to scheduled awv appt. Left msg for pt to call office to schedule awv appt.

## 2015-12-05 NOTE — Telephone Encounter (Signed)
Patient called to schedule annual wellness appointment.

## 2015-12-06 ENCOUNTER — Ambulatory Visit (INDEPENDENT_AMBULATORY_CARE_PROVIDER_SITE_OTHER): Payer: Medicare Other

## 2015-12-06 VITALS — BP 116/50 | HR 65 | Ht 68.0 in | Wt 207.2 lb

## 2015-12-06 DIAGNOSIS — Z0000 Encounter for general adult medical examination without abnormal findings: Secondary | ICD-10-CM | POA: Diagnosis not present

## 2015-12-06 NOTE — Patient Instructions (Addendum)
Continue to eat heart healthy diet (full of fruits, vegetables, whole grains, lean protein, water--limit salt, fat, and sugar intake) and increase physical activity as tolerated.  Continue doing brain stimulating activities (puzzles, reading, adult coloring books, staying active) to keep memory [place].   Bring a copy of your advance directives to your next office visit.   Fall Prevention in the Home Introduction Falls can cause injuries. They can happen to people of all ages. There are many things you can do to make your home safe and to help prevent falls. What can I do on the outside of my home?  Regularly fix the edges of walkways and driveways and fix any cracks.  Remove anything that might make you trip as you walk through a door, such as a raised step or threshold.  Trim any bushes or trees on the path to your home.  Use bright outdoor lighting.  Clear any walking paths of anything that might make someone trip, such as rocks or tools.  Regularly check to see if handrails are loose or broken. Make sure that both sides of any steps have handrails.  Any raised decks and porches should have guardrails on the edges.  Have any leaves, snow, or ice cleared regularly.  Use sand or salt on walking paths during winter.  Clean up any spills in your garage right away. This includes oil or grease spills. What can I do in the bathroom?  Use night lights.  Install grab bars by the toilet and in the tub and shower. Do not use towel bars as grab bars.  Use non-skid mats or decals in the tub or shower.  If you need to sit down in the shower, use a plastic, non-slip stool.  Keep the floor dry. Clean up any water that spills on the floor as soon as it happens.  Remove soap buildup in the tub or shower regularly.  Attach bath mats securely with double-sided non-slip rug tape.  Do not have throw rugs and other things on the floor that can make you trip. What can I do in the  bedroom?  Use night lights.  Make sure that you have a light by your bed that is easy to reach.  Do not use any sheets or blankets that are too big for your bed. They should not hang down onto the floor.  Have a firm chair that has side arms. You can use this for support while you get dressed.  Do not have throw rugs and other things on the floor that can make you trip. What can I do in the kitchen?  Clean up any spills right away.  Avoid walking on wet floors.  Keep items that you use a lot in easy-to-reach places.  If you need to reach something above you, use a strong step stool that has a grab bar.  Keep electrical cords out of the way.  Do not use floor polish or wax that makes floors slippery. If you must use wax, use non-skid floor wax.  Do not have throw rugs and other things on the floor that can make you trip. What can I do with my stairs?  Do not leave any items on the stairs.  Make sure that there are handrails on both sides of the stairs and use them. Fix handrails that are broken or loose. Make sure that handrails are as long as the stairways.  Check any carpeting to make sure that it is firmly attached to the   stairs. Fix any carpet that is loose or worn.  Avoid having throw rugs at the top or bottom of the stairs. If you do have throw rugs, attach them to the floor with carpet tape.  Make sure that you have a light switch at the top of the stairs and the bottom of the stairs. If you do not have them, ask someone to add them for you. What else can I do to help prevent falls?  Wear shoes that:  Do not have high heels.  Have rubber bottoms.  Are comfortable and fit you well.  Are closed at the toe. Do not wear sandals.  If you use a stepladder:  Make sure that it is fully opened. Do not climb a closed stepladder.  Make sure that both sides of the stepladder are locked into place.  Ask someone to hold it for you, if possible.  Clearly mark and make  sure that you can see:  Any grab bars or handrails.  First and last steps.  Where the edge of each step is.  Use tools that help you move around (mobility aids) if they are needed. These include:  Canes.  Walkers.  Scooters.  Crutches.  Turn on the lights when you go into a dark area. Replace any light bulbs as soon as they burn out.  Set up your furniture so you have a clear path. Avoid moving your furniture around.  If any of your floors are uneven, fix them.  If there are any pets around you, be aware of where they are.  Review your medicines with your doctor. Some medicines can make you feel dizzy. This can increase your chance of falling. Ask your doctor what other things that you can do to help prevent falls. This information is not intended to replace advice given to you by your health care provider. Make sure you discuss any questions you have with your health care provider. Document Released: 10/14/2008 Document Revised: 05/26/2015 Document Reviewed: 01/22/2014  2017 Elsevier  Health Maintenance, Male A healthy lifestyle and preventative care can promote health and wellness.  Maintain regular health, dental, and eye exams.  Eat a healthy diet. Foods like vegetables, fruits, whole grains, low-fat dairy products, and lean protein foods contain the nutrients you need and are low in calories. Decrease your intake of foods high in solid fats, added sugars, and salt. Get information about a proper diet from your health care provider, if necessary.  Regular physical exercise is one of the most important things you can do for your health. Most adults should get at least 150 minutes of moderate-intensity exercise (any activity that increases your heart rate and causes you to sweat) each week. In addition, most adults need muscle-strengthening exercises on 2 or more days a week.   Maintain a healthy weight. The body mass index (BMI) is a screening tool to identify possible  weight problems. It provides an estimate of body fat based on height and weight. Your health care provider can find your BMI and can help you achieve or maintain a healthy weight. For males 20 years and older:  A BMI below 18.5 is considered underweight.  A BMI of 18.5 to 24.9 is normal.  A BMI of 25 to 29.9 is considered overweight.  A BMI of 30 and above is considered obese.  Maintain normal blood lipids and cholesterol by exercising and minimizing your intake of saturated fat. Eat a balanced diet with plenty of fruits and vegetables. Blood tests   for lipids and cholesterol should begin at age 20 and be repeated every 5 years. If your lipid or cholesterol levels are high, you are over age 50, or you are at high risk for heart disease, you may need your cholesterol levels checked more frequently.Ongoing high lipid and cholesterol levels should be treated with medicines if diet and exercise are not working.  If you smoke, find out from your health care provider how to quit. If you do not use tobacco, do not start.  Lung cancer screening is recommended for adults aged 55-80 years who are at high risk for developing lung cancer because of a history of smoking. A yearly low-dose CT scan of the lungs is recommended for people who have at least a 30-pack-year history of smoking and are current smokers or have quit within the past 15 years. A pack year of smoking is smoking an average of 1 pack of cigarettes a day for 1 year (for example, a 30-pack-year history of smoking could mean smoking 1 pack a day for 30 years or 2 packs a day for 15 years). Yearly screening should continue until the smoker has stopped smoking for at least 15 years. Yearly screening should be stopped for people who develop a health problem that would prevent them from having lung cancer treatment.  If you choose to drink alcohol, do not have more than 2 drinks per day. One drink is considered to be 12 oz (360 mL) of beer, 5 oz (150  mL) of wine, or 1.5 oz (45 mL) of liquor.  Avoid the use of street drugs. Do not share needles with anyone. Ask for help if you need support or instructions about stopping the use of drugs.  High blood pressure causes heart disease and increases the risk of stroke. High blood pressure is more likely to develop in:  People who have blood pressure in the end of the normal range (100-139/85-89 mm Hg).  People who are overweight or obese.  People who are African American.  If you are 18-39 years of age, have your blood pressure checked every 3-5 years. If you are 40 years of age or older, have your blood pressure checked every year. You should have your blood pressure measured twice-once when you are at a hospital or clinic, and once when you are not at a hospital or clinic. Record the average of the two measurements. To check your blood pressure when you are not at a hospital or clinic, you can use:  An automated blood pressure machine at a pharmacy.  A home blood pressure monitor.  If you are 45-79 years old, ask your health care provider if you should take aspirin to prevent heart disease.  Diabetes screening involves taking a blood sample to check your fasting blood sugar level. This should be done once every 3 years after age 45 if you are at a normal weight and without risk factors for diabetes. Testing should be considered at a younger age or be carried out more frequently if you are overweight and have at least 1 risk factor for diabetes.  Colorectal cancer can be detected and often prevented. Most routine colorectal cancer screening begins at the age of 50 and continues through age 75. However, your health care provider may recommend screening at an earlier age if you have risk factors for colon cancer. On a yearly basis, your health care provider may provide home test kits to check for hidden blood in the stool. A small camera   at the end of a tube may be used to directly examine the colon  (sigmoidoscopy or colonoscopy) to detect the earliest forms of colorectal cancer. Talk to your health care provider about this at age 50 when routine screening begins. A direct exam of the colon should be repeated every 5-10 years through age 75, unless early forms of precancerous polyps or small growths are found.  People who are at an increased risk for hepatitis B should be screened for this virus. You are considered at high risk for hepatitis B if:  You were born in a country where hepatitis B occurs often. Talk with your health care provider about which countries are considered high risk.  Your parents were born in a high-risk country and you have not received a shot to protect against hepatitis B (hepatitis B vaccine).  You have HIV or AIDS.  You use needles to inject street drugs.  You live with, or have sex with, someone who has hepatitis B.  You are a man who has sex with other men (MSM).  You get hemodialysis treatment.  You take certain medicines for conditions like cancer, organ transplantation, and autoimmune conditions.  Hepatitis C blood testing is recommended for all people born from 1945 through 1965 and any individual with known risk factors for hepatitis C.  Healthy men should no longer receive prostate-specific antigen (PSA) blood tests as part of routine cancer screening. Talk to your health care provider about prostate cancer screening.  Testicular cancer screening is not recommended for adolescents or adult males who have no symptoms. Screening includes self-exam, a health care provider exam, and other screening tests. Consult with your health care provider about any symptoms you have or any concerns you have about testicular cancer.  Practice safe sex. Use condoms and avoid high-risk sexual practices to reduce the spread of sexually transmitted infections (STIs).  You should be screened for STIs, including gonorrhea and chlamydia if:  You are sexually active and  are younger than 24 years.  You are older than 24 years, and your health care provider tells you that you are at risk for this type of infection.  Your sexual activity has changed since you were last screened, and you are at an increased risk for chlamydia or gonorrhea. Ask your health care provider if you are at risk.  If you are at risk of being infected with HIV, it is recommended that you take a prescription medicine daily to prevent HIV infection. This is called pre-exposure prophylaxis (PrEP). You are considered at risk if:  You are a man who has sex with other men (MSM).  You are a heterosexual man who is sexually active with multiple partners.  You take drugs by injection.  You are sexually active with a partner who has HIV.  Talk with your health care provider about whether you are at high risk of being infected with HIV. If you choose to begin PrEP, you should first be tested for HIV. You should then be tested every 3 months for as long as you are taking PrEP.  Use sunscreen. Apply sunscreen liberally and repeatedly throughout the day. You should seek shade when your shadow is shorter than you. Protect yourself by wearing long sleeves, pants, a wide-brimmed hat, and sunglasses year round whenever you are outdoors.  Tell your health care provider of new moles or changes in moles, especially if there is a change in shape or color. Also, tell your health care provider if a mole   is larger than the size of a pencil eraser.  A one-time screening for abdominal aortic aneurysm (AAA) and surgical repair of large AAAs by ultrasound is recommended for men aged 65-75 years who are current or former smokers.  Stay current with your vaccines (immunizations). This information is not intended to replace advice given to you by your health care provider. Make sure you discuss any questions you have with your health care provider. Document Released: 06/16/2007 Document Revised: 01/08/2014 Document  Reviewed: 09/21/2014 Elsevier Interactive Patient Education  2017 Elsevier Inc.  

## 2015-12-06 NOTE — Progress Notes (Signed)
Pre visit review using our clinic review tool, if applicable. No additional management support is needed unless otherwise documented below in the visit note. 

## 2015-12-06 NOTE — Progress Notes (Addendum)
Subjective:   Andres Smith is a 75 y.o. male who presents for an Initial Medicare Annual Wellness Visit.  The Patient was informed that the wellness visit is to identify future health risk and educate and initiate measures that can reduce risk for increased disease through the lifespan.   Describes health as fair, good or great? "fair" Enjoys golf. Occasionally works as Chief Strategy Officer.   Review of Systems  No ROS.  Medicare Wellness Visit.  Cardiac Risk Factors include: advanced age (>24men, >9 women);diabetes mellitus;dyslipidemia;family history of premature cardiovascular disease;hypertension;male gender;obesity (BMI >30kg/m2);sedentary lifestyle   Sleep patterns: Sleeps 6-8 hours.  Home Safety/Smoke Alarms:  Smoke detectors in place.  Living environment; residence and Firearm Safety: Lives with wife in 2 story home/rails in place. Firearms safety discussed.  Seat Belt Safety/Bike Helmet: Wears seat belt.    Counseling:   Eye Exam-Last exam < 1 year. Followed yearly by Trey Sailors exam 6 months ago, followed yearly. Full dentures.    Male:   [facility]S-Colonoscopy 02/04/2015, negative. Recall 3 years.      PSA-2.290, 05/06/2012      Objective:    Today's Vitals   12/06/15 1010  BP: (!) 116/50  Pulse: 65  SpO2: 96%  Weight: 207 lb 3.2 oz (94 kg)  Height: 5\' 8"  (1.727 m)   Body mass index is 31.5 kg/m.  Current Medications (verified) Outpatient Encounter Prescriptions as of 12/06/2015  Medication Sig  . aspirin 81 MG tablet Take 81 mg by mouth daily.    Marland Kitchen atorvastatin (LIPITOR) 80 MG tablet Take 1 tablet (80 mg total) by mouth daily at 6 PM.  . carvedilol (COREG) 3.125 MG tablet Take 1 tablet (3.125 mg total) by mouth 2 (two) times daily with a meal.  . cyanocobalamin (,VITAMIN B-12,) 1000 MCG/ML injection INJECT 1ML ONCE MONTHLY.  . isosorbide mononitrate (IMDUR) 60 MG 24 hr tablet TAKE 1 TABLET EVERY DAY  . losartan (COZAAR) 100 MG tablet Take 1 tablet (100 mg  total) by mouth daily.  . mercaptopurine (PURINETHOL) 50 MG tablet TAKE 1 TABLET DAILY ON AN EMPTY STOMACH 1 HOUR BEFORE OR 2 HOURS AFTER A MEAL (CAUTION CHEMOTHERAPY)  . mesalamine (LIALDA) 1.2 g EC tablet TAKE 2 TABLETS EVERY DAY WITH BREAKFAST (please provide AG mesalamine for Lialda)  . metFORMIN (GLUCOPHAGE) 500 MG tablet Take 1 tablet (500 mg total) by mouth 2 (two) times daily with a meal. Must Keep 05/16/15 appt for future refills  . NITROSTAT 0.4 MG SL tablet ONE TAB UNDER TONGUE-MAY REPEAT FOR 2 DOSES EVERY 5 MIN X3TIMES-IF NORESPONSE CALL 911  . SYRINGE-NEEDLE, DISP, 3 ML (B-D 3CC LUER-LOK SYR 25GX5/8") 25G X 5/8" 3 ML MISC Use to inject b12 montly  . omeprazole (PRILOSEC) 40 MG capsule Take 1 capsule (40 mg total) by mouth daily. 30 min before breakfast. (Patient not taking: Reported on 12/06/2015)   No facility-administered encounter medications on file as of 12/06/2015.     Allergies (verified) Patient has no known allergies.   History: Past Medical History:  Diagnosis Date  . Ankylosing spondylitis ([place])   . Blood transfusion    hx of transfusion without reaction  . Bradycardia   . Cholelithiasis   . Coronary artery disease    a. s/p CABG x 6 in 1997 (LIMA->LAD, VG->RI ->OM1->OM2, VG->PDA->PLV;  b. 05/2011 Cath:  patent grafs, native prox rca and d2 dzs  ->med rx.  . Crohn's colitis ([place]) 12/01/1997  . Diabetes mellitus   . Dysphagia   .  Fatty liver   . GERD (gastroesophageal reflux disease)   . Heart block   . Hiatal hernia 12/03/2008  . History of skin cancer   . Hx of echocardiogram    Echo 5/14:  Mild LVH, EF 55-60%, NL diast fxn, mild LAE   . Hyperlipidemia   . Hypertension   . Kidney stones   . Neck pain   . Pneumonia    hx of PNA  . 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 cancer removal  2014   removed from left outter leg  . COLONOSCOPY  2012  . CORONARY ARTERY BYPASS GRAFT   04/19/1995   x7  . HERNIA REPAIR  1989  . LEFT HEART CATHETERIZATION WITH CORONARY/GRAFT ANGIOGRAM N/A 05/24/2011   Procedure: LEFT HEART CATHETERIZATION WITH Beatrix Fetters;  Surgeon: Larey Dresser, MD;  Location: [facility] CATH LAB;  Service: Cardiovascular;  Laterality: N/A;  . TONSILLECTOMY AND ADENOIDECTOMY  1944  . TRIGGER FINGER RELEASE    . UMBILICAL HERNIA REPAIR     Family History  Problem Relation Age of Onset  . Heart disease Father   . Heart disease Mother   . Arthritis Mother   . Heart disease    . Crohn's disease    . Heart disease Sister   . Cancer Paternal Grandmother     colon  . Colon cancer Neg Hx    Social History   Occupational History  . semi retired Sports coach Employed   Social History Main Topics  . Smoking status: Former Smoker    Quit date: 01/02/1983  . Smokeless tobacco: Former Systems developer  . Alcohol use Yes     Comment: tottie every night  . Drug use: No  . Sexual activity: Not Currently   Tobacco Counseling Counseling given: Not Answered   Activities of Daily Living In your present state of health, do you have any difficulty performing the following activities: 12/06/2015  Hearing? N  Vision? N  Difficulty concentrating or making decisions? N  Walking or climbing stairs? N  Dressing or bathing? N  Doing errands, shopping? N  Preparing Food and eating ? N  Using the Toilet? N  In the past six months, have you accidently leaked urine? N  Do you have problems with loss of bowel control? N  Managing your Medications? N  Managing your Finances? N  Housekeeping or managing your Housekeeping? N  Some recent data might be hidden    Immunizations and Health Maintenance Immunization History  Administered Date(s) Administered  . Influenza Whole 11/11/2007  . Influenza-Unspecified 11/01/2012, 11/11/2013, 11/02/2015  . Pneumococcal Conjugate-13 08/03/2013  . Pneumococcal Polysaccharide-23 11/08/2014  . Tdap 08/03/2013  . Tetanus 01/01/2009    Health Maintenance Due  Topic Date Due  . HEMOGLOBIN A1C  11/16/2015    Patient Care Team: Janith Lima, MD as PCP - General (Internal Medicine) Sherren Mocha, MD as Consulting Physician (Cardiology) Jarome Matin, MD as Consulting Physician (Dermatology) Jerene Bears, MD as Consulting Physician (Gastroenterology) Katy Apo, MD as Consulting Physician (Ophthalmology)  Indicate any recent Medical Services you may have received from other than [facility] providers in the past year (date may be approximate).    Assessment:   This is a routine wellness examination for Rahi. Physical assessment deferred to PCP.   Hearing/Vision screen Hearing Screening Comments: Able to hear conversational tones w/o difficulty. No issues reported.   Vision Screening Comments: Wears reading glasses.   Dietary issues  and exercise activities discussed: Current Exercise Habits: The patient does not participate in regular exercise at present (golf, yard work), Exercise limited by: cardiac condition(s);orthopedic condition(s)   Diet (meal preparation, eat out, water intake, caffeinated beverages, dairy products, fruits and vegetables): Eats at home majority of meals.  Drinks green tea, not much water.   Breakfast: Toast, oatmeal, eggs, bacon, coffee (2 cups) Lunch: sandwich, pb crackers Dinner: Meat, 2 vegetables.   Encouraged to increase water intake and exercise. Discussed healthy food options.   Goals    . Weight (lb) < 190 lb (86.2 kg)          Increase activity by walking as tolerated, increase water intake and make healthier food choices.       Depression Screen PHQ 2/9 Scores 12/06/2015 11/08/2014 08/03/2013  PHQ - 2 Score 0 0 0    Fall Risk Fall Risk  12/06/2015 11/08/2014 08/03/2013  Falls in the past year? No Yes No  Number falls in past yr: - 1 -  Injury with Fall? - Yes -    Cognitive Function:       Ad8 score reviewed for issues:  Issues making decisions: no  Less interest in  hobbies / activities: no  Repeats questions, stories (family complaining):no  Trouble using ordinary gadgets (microwave, computer, phone):no  Forgets the month or year: no  Mismanaging finances: no  Remembering appts:no  Daily problems with thinking and/or memory:no Ad8 score is=0     Screening Tests Health Maintenance  Topic Date Due  . HEMOGLOBIN A1C  11/16/2015  . ZOSTAVAX  05/15/2016 (Originally 09/09/1999)  . FOOT EXAM  05/31/2016 (Originally 08/04/2014)  . OPHTHALMOLOGY EXAM  01/02/2016  . COLONOSCOPY  02/03/2018  . TETANUS/TDAP  08/04/2023  . INFLUENZA VACCINE  Addressed  . PNA vac Low Risk Adult  Completed   Patient reports no issues with feet, assesses daily. Has pedicure monthly. Declines foot exam today.       Plan:     Continue to eat heart healthy diet (full of fruits, vegetables, whole grains, lean protein, water--limit salt, fat, and sugar intake) and increase physical activity as tolerated.  Continue doing brain stimulating activities (puzzles, reading, adult coloring books, staying active) to keep memory [place].   Bring a copy of your advance directives to your next office visit.  FYI-Pt reports very occasional chest pain once a week, lasting less than 30 seconds. Denies SOB or other symptoms. Patient believes this may be muscular. Patient advised to make appointment with his cardiologist.   During the course of the visit Keala was educated and counseled about the following appropriate screening and preventive services:   Vaccines to include Pneumoccal, Influenza, Hepatitis B, Td, Zostavax, HCV  Colorectal cancer screening  Cardiovascular disease screening  Diabetes screening  Glaucoma screening  Nutrition counseling  Prostate cancer screening   Patient Instructions (the written plan) were given to the patient.   Gerilyn Nestle, RN   12/06/2015     Medical screening examination/treatment/procedure(s) were performed by non-physician  practitioner and as supervising physician I was immediately available for consultation/collaboration. I agree with above. [name], MD

## 2015-12-13 ENCOUNTER — Ambulatory Visit (INDEPENDENT_AMBULATORY_CARE_PROVIDER_SITE_OTHER): Payer: Medicare Other | Admitting: Physician Assistant

## 2015-12-13 ENCOUNTER — Encounter: Payer: Self-pay | Admitting: Physician Assistant

## 2015-12-13 VITALS — BP 128/62 | HR 46 | Ht 68.0 in | Wt 202.8 lb

## 2015-12-13 DIAGNOSIS — I209 Angina pectoris, unspecified: Secondary | ICD-10-CM

## 2015-12-13 DIAGNOSIS — I779 Disorder of arteries and arterioles, unspecified: Secondary | ICD-10-CM

## 2015-12-13 DIAGNOSIS — I25119 Atherosclerotic heart disease of native coronary artery with unspecified angina pectoris: Secondary | ICD-10-CM | POA: Diagnosis not present

## 2015-12-13 DIAGNOSIS — I10 Essential (primary) hypertension: Secondary | ICD-10-CM | POA: Diagnosis not present

## 2015-12-13 DIAGNOSIS — I739 Peripheral vascular disease, unspecified: Secondary | ICD-10-CM

## 2015-12-13 DIAGNOSIS — E7800 Pure hypercholesterolemia, unspecified: Secondary | ICD-10-CM

## 2015-12-13 DIAGNOSIS — I2510 Atherosclerotic heart disease of native coronary artery without angina pectoris: Secondary | ICD-10-CM

## 2015-12-13 MED ORDER — ISOSORBIDE MONONITRATE ER 60 MG PO TB24: 90.0000 mg | ORAL_TABLET | Freq: Every day | ORAL | 3 refills | Status: DC

## 2015-12-13 NOTE — Progress Notes (Signed)
Cardiology Office Note:    Date:  12/13/2015   ID:  Andres Smith, DOB 12/12/1940, MRN [id number]  PCP:  [name], MD  Cardiologist:  Dr. Sherren Mocha   Electrophysiologist:  n/a  Referring MD: Janith Lima, MD   Chief Complaint  Patient presents with  . Chest Pain    History of Present Illness:    Andres Smith is a 75 y.o. male with a hx of CAD, s/p CABG, HTN, HL, DM2, inflammatory bowel disease, ankylosing spondylitis. Last cath in 05/2011 with patent grafts but severe stenosis in the small diagonal beyond the LIMA insertion. Medical Rx recommended. Patient seen in 3/16 and noted history of chronic dizziness with postural changes as well as stable CCS class II angina. Cardiac catheterization films were reviewed again from 2013. Medical therapy was continued. Of note, at that time, symptoms had not changed for several years. Dose of carvedilol was reduced secondary to his history of dizziness.   Last seen by Dr. Sherren Mocha in 3/17.  Echo was obtained due to cardiac murmur.  This demonstrated normal ejection fraction and no significant valvular abnormalities.    He returns today for further evaluation of chest discomfort. He had an increase in frequency of chest discomfort about 3-4 weeks ago. One episode was somewhat similar to his previous angina. Over the last week or 2, he has had no further symptoms. He did not take nitroglycerin. Most of his pains were felt to be [place] and only lasted several seconds. He denies significant dyspnea, syncope, orthopnea, PND. He has chronic LE edema without change. He recently saw Dr. Verl Blalock and discussed his symptoms with him. [name] wife urged him to come in for follow-up.  Prior CV studies that were reviewed today include:    Echo 3/17 EF 50-55, no RWMA, gr 1 DD, mod LAE  LHC (5/13):  Distal left main 40%, mid LAD occluded, ostial D2 70-80%, proximal D2 distal to LIMA touchdown 90%, proximal circumflex occluded, proximal RCA 99%,  distal RCA occluded, mid PDA beyond SVG touchdown 60-70%,EF 50%; LIMA-D2/LAD patent, SVG-ramus/OM1/OM2 patent, SVG-PDA and PLV patent >>> med rx  Echo (5/14):  Mild LVH, EF 55-60%, normal wall motion, mild LAE  Carotid US (11/14):  Bilateral ICA 1-39%-followup 1 year  Carotid US (11/15):  Bilateral ICA 1-39% - FU 2 years  Past Medical History:  Diagnosis Date  . Ankylosing spondylitis ([place])   . Blood transfusion    hx of transfusion without reaction  . Bradycardia   . Cholelithiasis   . Coronary artery disease    a. s/p CABG x 6 in 1997 (LIMA->LAD, VG->RI ->OM1->OM2, VG->PDA->PLV;  b. 05/2011 Cath:  patent grafs, native prox rca and d2 dzs  ->med rx.  . Crohn's colitis ([place]) 12/01/1997  . Diabetes mellitus   . Dysphagia   . Fatty liver   . GERD (gastroesophageal reflux disease)   . Heart block   . Hiatal hernia 12/03/2008  . History of skin cancer   . Hx of echocardiogram    Echo 5/14:  Mild LVH, EF 55-60%, NL diast fxn, mild LAE   . Hyperlipidemia   . Hypertension   . Kidney stones   . Neck pain   . Pneumonia    hx of PNA  . 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 cancer removal  2014   removed from left outter leg  . COLONOSCOPY  2012  . CORONARY  ARTERY BYPASS GRAFT  04/19/1995   x7  . HERNIA REPAIR  1989  . LEFT HEART CATHETERIZATION WITH CORONARY/GRAFT ANGIOGRAM N/A 05/24/2011   Procedure: LEFT HEART CATHETERIZATION WITH Beatrix Fetters;  Surgeon: Larey Dresser, MD;  Location: [facility] CATH LAB;  Service: Cardiovascular;  Laterality: N/A;  . TONSILLECTOMY AND ADENOIDECTOMY  1944  . TRIGGER FINGER RELEASE    . UMBILICAL HERNIA REPAIR      Current Medications: Current Meds  Medication Sig  . aspirin 81 MG tablet Take 81 mg by mouth daily.    Marland Kitchen atorvastatin (LIPITOR) 80 MG tablet Take 1 tablet (80 mg total) by mouth daily at 6 PM.  . carvedilol (COREG) 3.125 MG tablet Take 1  tablet (3.125 mg total) by mouth 2 (two) times daily with a meal.  . cyanocobalamin (,VITAMIN B-12,) 1000 MCG/ML injection INJECT 1ML ONCE MONTHLY.  Marland Kitchen losartan (COZAAR) 100 MG tablet Take 1 tablet (100 mg total) by mouth daily.  . mercaptopurine (PURINETHOL) 50 MG tablet TAKE 1 TABLET DAILY ON AN EMPTY STOMACH 1 HOUR BEFORE OR 2 HOURS AFTER A MEAL (CAUTION CHEMOTHERAPY)  . mesalamine (LIALDA) 1.2 g EC tablet TAKE 2 TABLETS EVERY DAY WITH BREAKFAST (please provide AG mesalamine for Lialda)  . metFORMIN (GLUCOPHAGE) 500 MG tablet Take 1 tablet (500 mg total) by mouth 2 (two) times daily with a meal. Must Keep 05/16/15 appt for future refills  . NITROSTAT 0.4 MG SL tablet ONE TAB UNDER TONGUE-MAY REPEAT FOR 2 DOSES EVERY 5 MIN X3TIMES-IF NORESPONSE CALL 911  . SYRINGE-NEEDLE, DISP, 3 ML (B-D 3CC LUER-LOK SYR 25GX5/8") 25G X 5/8" 3 ML MISC Use to inject b12 montly  . [DISCONTINUED] isosorbide mononitrate (IMDUR) 60 MG 24 hr tablet TAKE 1 TABLET EVERY DAY     Allergies:   Patient has no known allergies.   Social History   Social History  . Marital status: Married    Spouse name: N/A  . Number of children: 2  . Years of education: N/A   Occupational History  . semi retired Sports coach Employed   Social History Main Topics  . Smoking status: Former Smoker    Quit date: 01/02/1983  . Smokeless tobacco: Former Systems developer  . Alcohol use Yes     Comment: tottie every night  . Drug use: No  . Sexual activity: Not Currently   Other Topics Concern  . None   Social History Narrative   HSG, [employer] - Civil engineering/education in industrial, [place] from [place]. Married '64. 1 son '74, 1 dtr - '69; 1 grandchild, 1 on the way. 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 Lives with wife. ACP -              Family  History:  The patient's family history includes Arthritis in his mother; Cancer in his paternal grandmother; Heart disease in his father, mother, and sister.   ROS:   Please see the history of present illness.    Review of Systems  Cardiovascular: Positive for chest pain.  Hematologic/Lymphatic: Bruises/bleeds easily.   All other systems reviewed and are negative.   EKGs/Labs/Other Test Reviewed:    EKG:  EKG is  ordered today.  The ekg ordered today demonstrates Sinus bradycardia, HR 46, normal axis, RBBB, QTc 383 ms, no change from prior tracing  Recent Labs: 05/16/2015: TSH  2.34 10/03/2015: Hemoglobin 12.8; Platelets 164.0 10/17/2015: ALT 10; BUN 14; Creatinine, Ser 0.94; Potassium 4.3; Sodium 138   Recent Lipid Panel    Component Value Date/Time   CHOL 136 05/16/2015 1621   TRIG 90.0 05/16/2015 1621   TRIG 54 12/07/2005 1117   HDL 58.10 05/16/2015 1621   CHOLHDL 2 05/16/2015 1621   VLDL 18.0 05/16/2015 1621   LDLCALC 60 05/16/2015 1621     Physical Exam:    VS:  BP 128/62   Pulse (!) 46   Ht 5\' 8"  (1.727 m)   Wt 202 lb 12.8 oz (92 kg)   BMI 30.84 kg/m     Wt Readings from Last 3 Encounters:  12/13/15 202 lb 12.8 oz (92 kg)  12/06/15 207 lb 3.2 oz (94 kg)  10/03/15 203 lb (92.1 kg)     Physical Exam  Constitutional: He is oriented to person, place, and time. He appears well-developed and well-nourished. No distress.  HENT:  Head: Normocephalic and atraumatic.  Eyes: No scleral icterus.  Neck: No JVD present.  Cardiovascular: Normal rate, regular rhythm and normal heart sounds.   No murmur heard. Pulmonary/Chest: Effort normal. He has no wheezes. He has no rales.  Abdominal: There is no tenderness.  Musculoskeletal: Normal range of motion. He exhibits edema.  Trace-1+ bilateral LE edema  Neurological: He is alert and oriented to person, place, and time.  Skin: Skin is warm and dry.  Psychiatric: He has a normal mood and affect.    ASSESSMENT:    1.  Coronary artery disease involving native coronary artery of native heart with angina pectoris ([place])   2. Essential hypertension, benign   3. Pure hypercholesterolemia   4. Bilateral carotid artery disease ([place])    PLAN:    In order of problems listed above:  1. CAD - s/p CABG.  He has a hx of chronic stable angina (CCS class 2).  LHC in 2013 with patent grafts and severe stenosis in a small diagonal beyond LIMA insertion treated medically.  Recently, he had an increase in his symptoms.  He has had no ischemic evaluation since 2013. Heart rate is too slow to increase his beta blocker.   -  Continue ASA, statin, beta-blocker.  -  I will adjust his nitrates by increasing Isosorbide to 90 mg daily.   -  Arrange ETT-Myoview.  -  FU 4-6 weeks  2. HTN - BP is controlled.   3. HL - Continue statin.  LDL in 5/17 was 60.   4. Carotid artery disease - Continue ASA, statin.    Medication Adjustments/Labs and Tests Ordered: Current medicines are reviewed at length with the patient today.  Concerns regarding medicines are outlined above.  Medication changes, Labs and Tests ordered today are outlined in the Patient Instructions noted below. Patient Instructions  Medication Instructions:  1. INCREASE IMDUR TO 90 MG DAILY; THIS WILL BE 1 AND 1/2 TABLETS OF THE 60 MG TABS  Labwork: NONE  Testing/Procedures: Your physician has requested that you have en exercise stress myoview. For further information please visit [email or web address]. Please follow instruction sheet, as given.  Follow-Up: Jaedin Regina, PAC IN ABOUT 4-6 WEEKS SAME DAY DR. Burt Knack IS IN THE OFFICE Any Other Special Instructions Will Be Listed Below (If Applicable).  If you need a refill on your cardiac medications before your next appointment, please call your pharmacy.  Signed, Richardson Dopp, PA-C  12/13/2015 1:05 PM    [facility]  [address] [place], [place]  [address] Phone: [phone]; Fax:  [phone]

## 2015-12-13 NOTE — Patient Instructions (Addendum)
Medication Instructions:  1. INCREASE IMDUR TO 90 MG DAILY; THIS WILL BE 1 AND 1/2 TABLETS OF THE 60 MG TABS  Labwork: NONE  Testing/Procedures: Your physician has requested that you have en exercise stress myoview. For further information please visit [email or web address]. Please follow instruction sheet, as given.  Follow-Up: SCOTT WEAVER, PAC IN ABOUT 4-6 WEEKS SAME DAY DR. Burt Knack IS IN THE OFFICE Any Other Special Instructions Will Be Listed Below (If Applicable).  If you need a refill on your cardiac medications before your next appointment, please call your pharmacy.

## 2016-01-09 ENCOUNTER — Telehealth (HOSPITAL_COMMUNITY): Payer: Self-pay | Admitting: *Deleted

## 2016-01-09 NOTE — Telephone Encounter (Signed)
Left message on voicemail per DPR in reference to upcoming appointment scheduled on 01/11/16 at 0945 with detailed instructions given per Myocardial Perfusion Study Information Sheet for the test. LM to arrive 15 minutes early, and that it is imperative to arrive on time for appointment to keep from having the test rescheduled. If you need to cancel or reschedule your appointment, please call the office within 24 hours of your appointment. Failure to do so may result in a cancellation of your appointment, and a $50 no show fee. Phone number given for call back for any questions.

## 2016-01-10 ENCOUNTER — Other Ambulatory Visit: Payer: Self-pay

## 2016-01-10 MED ORDER — MERCAPTOPURINE 50 MG PO TABS: ORAL_TABLET | ORAL | 1 refills | Status: DC

## 2016-01-11 ENCOUNTER — Encounter: Payer: Self-pay | Admitting: Physician Assistant

## 2016-01-11 ENCOUNTER — Ambulatory Visit (HOSPITAL_COMMUNITY): Payer: PPO | Attending: Cardiovascular Disease

## 2016-01-11 DIAGNOSIS — I25119 Atherosclerotic heart disease of native coronary artery with unspecified angina pectoris: Secondary | ICD-10-CM | POA: Insufficient documentation

## 2016-01-11 LAB — MYOCARDIAL PERFUSION IMAGING
CHL CUP NUCLEAR SDS: 8
CHL CUP RESTING HR STRESS: 60 {beats}/min
LHR: 0.31
NUC STRESS TID: 1.06
Peak HR: 75 {beats}/min
SRS: 13
SSS: 17

## 2016-01-11 MED ORDER — TECHNETIUM TC 99M TETROFOSMIN IV KIT
9.8000 | PACK | Freq: Once | INTRAVENOUS | Status: AC | PRN
  Administered 2016-01-11: 9.8 via INTRAVENOUS
  Filled 2016-01-11: qty 10

## 2016-01-11 MED ORDER — TECHNETIUM TC 99M TETROFOSMIN IV KIT
32.3000 | PACK | Freq: Once | INTRAVENOUS | Status: AC | PRN
  Administered 2016-01-11: 32.3 via INTRAVENOUS
  Filled 2016-01-11: qty 33

## 2016-01-11 MED ORDER — REGADENOSON 0.4 MG/5ML IV SOLN
0.4000 mg | Freq: Once | INTRAVENOUS | Status: AC
  Administered 2016-01-11: 0.4 mg via INTRAVENOUS

## 2016-01-12 ENCOUNTER — Telehealth: Payer: Self-pay | Admitting: Physician Assistant

## 2016-01-12 NOTE — Telephone Encounter (Signed)
New message   Pt verbalized that he is returning call for MYOCARDIAL PERFUSION results

## 2016-01-12 NOTE — Telephone Encounter (Signed)
Pt notified of Myoview results by phone with verbal understanding. Confirmed 01/18/16 appt with Brynda Rim. PA. Pt asked since Myoview unchanged, he wanted to know if her could decrease the Imdur back to 60 mg daily from the increased dose of 90 mg daily. Pt denies any dizziness, he just thought since Myoview ok. I asked pt if the increased dose has helped his symptoms, pt answered yes. I advised pt the increased Imdur seems to be helping his chest discomfort that he had been having, pt agrees with this. I asked pt to continue Imdur 90 mg daily until his appt 01/18/16 with PA and address at that time. Pt is agreeable to this plan of care.

## 2016-01-13 ENCOUNTER — Other Ambulatory Visit: Payer: Self-pay

## 2016-01-13 MED ORDER — ATORVASTATIN CALCIUM 80 MG PO TABS
80.0000 mg | ORAL_TABLET | Freq: Every day | ORAL | 0 refills | Status: DC
Start: 2016-01-13 — End: 2016-04-28

## 2016-01-18 ENCOUNTER — Ambulatory Visit: Payer: Medicare Other | Admitting: Physician Assistant

## 2016-02-01 ENCOUNTER — Encounter: Payer: Self-pay | Admitting: Physician Assistant

## 2016-02-01 ENCOUNTER — Ambulatory Visit (INDEPENDENT_AMBULATORY_CARE_PROVIDER_SITE_OTHER): Payer: PPO | Admitting: Physician Assistant

## 2016-02-01 VITALS — BP 104/60 | HR 62 | Ht 68.0 in | Wt 201.0 lb

## 2016-02-01 DIAGNOSIS — I10 Essential (primary) hypertension: Secondary | ICD-10-CM

## 2016-02-01 DIAGNOSIS — I25119 Atherosclerotic heart disease of native coronary artery with unspecified angina pectoris: Secondary | ICD-10-CM | POA: Diagnosis not present

## 2016-02-01 NOTE — Progress Notes (Signed)
Cardiology Office Note:    Date:  02/01/2016   ID:  [name], DOB 01-03-1940, MRN [id number]  PCP:  [name], MD  Cardiologist:  Dr. Sherren Mocha   Electrophysiologist:  Lauro Regulus Referring MD: Janith Lima, MD   Chief Complaint  Patient presents with  . Follow-up    CAD    History of Present Illness:    Andres Smith is a 76 y.o. male with a hx of  CAD, s/p CABG, HTN, HL, DM2, inflammatory bowel disease, ankylosing spondylitis. Last cath in 05/2011 with patent grafts but severe stenosis in the small diagonal beyond the LIMA insertion. Medical Rx recommended. Patient seen in 3/16 and noted history of chronic dizziness with postural changes as well as stable CCS class II angina. Cardiac catheterization films were reviewed again from 2013. Medical therapy was continued. Of note, at that time, symptoms had not changed for several years. Dose of carvedilol was reduced secondary to his history of dizziness.   Echo was obtained in 3/17 due to cardiac murmur.  This demonstrated normal ejection fraction and no significant valvular abnormalities.    I saw him in 12/17 with increased frequency of chest pain for a few weeks.  When I saw him, his symptoms had improved.  Myoview was arranged.  This demonstrated old scar but no ischemia.  Continued medical Rx was recommended.    He returns for follow up.  He is here alone.  His wife is at Dr. Clarice Pole office to evaluate her back.  Mr. Kerstetter denies any further changes in his chest pain.  He denies exertional symptoms.  He denies significant dyspnea on exertion.  He denies syncope.  He has chronic LE edema that is unchanged.   Prior CV studies that were reviewed today include:    Myoview 12/17 Myoview 1/18: not gated, large inferolateral scar, no ischemia; Intermediate Risk (IL scar old - on prior studies >> med rx)  Echo 3/17 EF 50-55, no RWMA, gr 1 DD, mod LAE  LHC (5/13):  Distal left main 40%, mid LAD occluded, ostial D2 70-80%,  proximal D2 distal to LIMA touchdown 90%, proximal circumflex occluded, proximal RCA 99%, distal RCA occluded, mid PDA beyond SVG touchdown 60-70%,EF 50%; LIMA-D2/LAD patent, SVG-ramus/OM1/OM2 patent, SVG-PDA and PLV patent >>> med rx  Echo (5/14):  Mild LVH, EF 55-60%, normal wall motion, mild LAE  Carotid US (11/14):  Bilateral ICA 1-39%-followup 1 year  Carotid US (11/15):  Bilateral ICA 1-39% - FU 2 years   Past Medical History:  Diagnosis Date  . Ankylosing spondylitis ([place])   . Blood transfusion    hx of transfusion without reaction  . Bradycardia   . Cholelithiasis   . Coronary artery disease    a. s/p CABG x 6 in 1997 (LIMA->LAD, VG->RI ->OM1->OM2, VG->PDA->PLV // b. 05/2011 Cath:  patent grafs, native prox rca and d2 dzs  ->med rx. // c. Myoview 1/18: not gated, large inferolateral scar, no ischemia; Intermediate Risk (IL scar old - on prior studies >> med rx)  . Crohn's colitis ([place]) 12/01/1997  . Diabetes mellitus   . Dysphagia   . Fatty liver   . GERD (gastroesophageal reflux disease)   . Heart block   . Hiatal hernia 12/03/2008  . History of skin cancer   . Hx of echocardiogram    Echo 5/14:  Mild LVH, EF 55-60%, NL diast fxn, mild LAE   . Hyperlipidemia   . Hypertension   . Kidney stones   .  Neck pain   . Pneumonia    hx of PNA  . 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 cancer removal  2014   removed from left outter leg  . COLONOSCOPY  2012  . CORONARY ARTERY BYPASS GRAFT  04/19/1995   x7  . HERNIA REPAIR  1989  . LEFT HEART CATHETERIZATION WITH CORONARY/GRAFT ANGIOGRAM N/A 05/24/2011   Procedure: LEFT HEART CATHETERIZATION WITH Beatrix Fetters;  Surgeon: Larey Dresser, MD;  Location: [facility] CATH LAB;  Service: Cardiovascular;  Laterality: N/A;  . TONSILLECTOMY AND ADENOIDECTOMY  1944  . TRIGGER FINGER RELEASE    . UMBILICAL HERNIA REPAIR      Current  Medications: Current Meds  Medication Sig  . aspirin 81 MG tablet Take 81 mg by mouth daily.    Marland Kitchen atorvastatin (LIPITOR) 80 MG tablet Take 1 tablet (80 mg total) by mouth daily at 6 PM.  . carvedilol (COREG) 3.125 MG tablet Take 1 tablet (3.125 mg total) by mouth 2 (two) times daily with a meal.  . cyanocobalamin (,VITAMIN B-12,) 1000 MCG/ML injection INJECT 1ML ONCE MONTHLY.  . isosorbide mononitrate (IMDUR) 60 MG 24 hr tablet Take 1.5 tablets (90 mg total) by mouth daily.  Marland Kitchen losartan (COZAAR) 100 MG tablet Take 1 tablet (100 mg total) by mouth daily.  . mercaptopurine (PURINETHOL) 50 MG tablet TAKE 1 TABLET DAILY ON AN EMPTY STOMACH 1 HOUR BEFORE OR 2 HOURS AFTER A MEAL  . mesalamine (LIALDA) 1.2 g EC tablet TAKE 2 TABLETS EVERY DAY WITH BREAKFAST (please provide AG mesalamine for Lialda)  . metFORMIN (GLUCOPHAGE) 500 MG tablet Take 1 tablet (500 mg total) by mouth 2 (two) times daily with a meal. Must Keep 05/16/15 appt for future refills  . NITROSTAT 0.4 MG SL tablet ONE TAB UNDER TONGUE-MAY REPEAT FOR 2 DOSES EVERY 5 MIN X3TIMES-IF NORESPONSE CALL 911  . SYRINGE-NEEDLE, DISP, 3 ML (B-D 3CC LUER-LOK SYR 25GX5/8") 25G X 5/8" 3 ML MISC Use to inject b12 montly     Allergies:   Patient has no known allergies.   Social History   Social History  . Marital status: Married    Spouse name: N/A  . Number of children: 2  . Years of education: N/A   Occupational History  . semi retired Sports coach Employed   Social History Main Topics  . Smoking status: Former Smoker    Quit date: 01/02/1983  . Smokeless tobacco: Former Systems developer  . Alcohol use Yes     Comment: tottie every night  . Drug use: No  . Sexual activity: Not Currently   Other Topics Concern  . None   Social History Narrative   HSG, [employer] - Civil engineering/education in industrial, [place] from [place]. Married '64. 1 son '74, 1 dtr - '69; 1 grandchild, 1 on the way. 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 Lives with wife. ACP -              Family History:  The patient's family history includes Arthritis in his mother; Cancer in his paternal grandmother; Heart disease in his father, mother, and sister.   ROS:   Please see the history of present illness.    ROS All other systems reviewed and are negative.   EKGs/Labs/Other Test  Reviewed:    EKG:  EKG is not ordered today.  The ekg ordered today demonstrates n/a  Recent Labs: 05/16/2015: TSH 2.34 10/03/2015: Hemoglobin 12.8; Platelets 164.0 10/17/2015: ALT 10; BUN 14; Creatinine, Ser 0.94; Potassium 4.3; Sodium 138   Recent Lipid Panel    Component Value Date/Time   CHOL 136 05/16/2015 1621   TRIG 90.0 05/16/2015 1621   TRIG 54 12/07/2005 1117   HDL 58.10 05/16/2015 1621   CHOLHDL 2 05/16/2015 1621   VLDL 18.0 05/16/2015 1621   LDLCALC 60 05/16/2015 1621     Physical Exam:    VS:  BP 104/60   Pulse 62   Ht 5\' 8"  (1.727 m)   Wt 201 lb (91.2 kg)   BMI 30.56 kg/m     Wt Readings from Last 3 Encounters:  02/01/16 201 lb (91.2 kg)  01/11/16 202 lb (91.6 kg)  12/13/15 202 lb 12.8 oz (92 kg)     Physical Exam  Constitutional: He is oriented to person, place, and time. He appears well-developed and well-nourished. No distress.  HENT:  Head: Normocephalic and atraumatic.  Eyes: No scleral icterus.  Neck: No JVD present.  Cardiovascular: Normal rate, regular rhythm and normal heart sounds.   No murmur heard. Pulmonary/Chest: Effort normal. He has no wheezes. He has no rales.  Abdominal: There is no tenderness.  Musculoskeletal: He exhibits edema.  Trace to 1+ bilateral LE edema  Neurological: He is alert and oriented to person, place, and time.  Skin: Skin is warm and dry.  Psychiatric: He has a normal mood and affect.    ASSESSMENT:    1. Coronary  artery disease involving native coronary artery of native heart with angina pectoris ([place])   2. Essential hypertension, benign    PLAN:    In order of problems listed above:  1. CAD - s/p CABG.  He has a hx of chronic stable angina (CCS class 2).  LHC in 2013 with patent grafts and severe stenosis in a small diagonal beyond LIMA insertion treated medically.  Myoview in 12/17 with old scar but no ischemia.  His symptoms are overall stable.             -  Continue ASA, statin, beta-blocker, nitrates.  2. HTN - BP is at target.  Continue current rx.    Medication Adjustments/Labs and Tests Ordered: Current medicines are reviewed at length with the patient today.  Concerns regarding medicines are outlined above.  Medication changes, Labs and Tests ordered today are outlined in the Patient Instructions noted below. Patient Instructions  Medication Instructions:  Your physician recommends that you continue on your current medications as directed. Please refer to the Current Medication list given to you today.  Labwork: NONE  Testing/Procedures: NONE  Follow-Up: Your physician wants you to follow-up in: 6 MONTHS WITH DR. Burt Knack You will receive a reminder letter in the mail two months in advance. If you don't receive a letter, please call our office to schedule the follow-up appointment.  Any Other Special Instructions Will Be Listed Below (If Applicable).  If you need a refill on your cardiac medications before your next appointment, please call your pharmacy.  Signed, Richardson Dopp, PA-C  02/01/2016 5:32 PM    [facility] [place], [place], [place]  [address] Phone: [phone]; Fax: [phone]

## 2016-02-01 NOTE — Patient Instructions (Addendum)
Medication Instructions:  Your physician recommends that you continue on your current medications as directed. Please refer to the Current Medication list given to you today.  Labwork: NONE  Testing/Procedures: NONE  Follow-Up: Your physician wants you to follow-up in: 6 MONTHS WITH DR. COOPER.  You will receive a reminder letter in the mail two months in advance. If you don't receive a letter, please call our office to schedule the follow-up appointment.  Any Other Special Instructions Will Be Listed Below (If Applicable).  If you need a refill on your cardiac medications before your next appointment, please call your pharmacy. 

## 2016-02-07 DIAGNOSIS — D1801 Hemangioma of skin and subcutaneous tissue: Secondary | ICD-10-CM | POA: Diagnosis not present

## 2016-02-07 DIAGNOSIS — L812 Freckles: Secondary | ICD-10-CM | POA: Diagnosis not present

## 2016-02-07 DIAGNOSIS — Z85828 Personal history of other malignant neoplasm of skin: Secondary | ICD-10-CM | POA: Diagnosis not present

## 2016-02-07 DIAGNOSIS — D225 Melanocytic nevi of trunk: Secondary | ICD-10-CM | POA: Diagnosis not present

## 2016-02-07 DIAGNOSIS — L821 Other seborrheic keratosis: Secondary | ICD-10-CM | POA: Diagnosis not present

## 2016-02-07 DIAGNOSIS — L570 Actinic keratosis: Secondary | ICD-10-CM | POA: Diagnosis not present

## 2016-02-29 ENCOUNTER — Other Ambulatory Visit: Payer: Self-pay | Admitting: Cardiovascular Disease

## 2016-02-29 DIAGNOSIS — D3709 Neoplasm of uncertain behavior of other specified sites of the oral cavity: Secondary | ICD-10-CM | POA: Diagnosis not present

## 2016-02-29 MED ORDER — LOSARTAN POTASSIUM 100 MG PO TABS: 100.0000 mg | ORAL_TABLET | Freq: Every day | ORAL | 3 refills | Status: DC

## 2016-03-01 DIAGNOSIS — C801 Malignant (primary) neoplasm, unspecified: Secondary | ICD-10-CM

## 2016-03-01 HISTORY — DX: Malignant (primary) neoplasm, unspecified: C80.1

## 2016-03-06 DIAGNOSIS — C060 Malignant neoplasm of cheek mucosa: Secondary | ICD-10-CM | POA: Diagnosis not present

## 2016-03-08 DIAGNOSIS — E113293 Type 2 diabetes mellitus with mild nonproliferative diabetic retinopathy without macular edema, bilateral: Secondary | ICD-10-CM | POA: Diagnosis not present

## 2016-03-08 DIAGNOSIS — C060 Malignant neoplasm of cheek mucosa: Secondary | ICD-10-CM | POA: Diagnosis not present

## 2016-03-08 DIAGNOSIS — H2513 Age-related nuclear cataract, bilateral: Secondary | ICD-10-CM | POA: Diagnosis not present

## 2016-03-08 DIAGNOSIS — H25013 Cortical age-related cataract, bilateral: Secondary | ICD-10-CM | POA: Diagnosis not present

## 2016-03-08 DIAGNOSIS — H52203 Unspecified astigmatism, bilateral: Secondary | ICD-10-CM | POA: Diagnosis not present

## 2016-03-12 DIAGNOSIS — C060 Malignant neoplasm of cheek mucosa: Secondary | ICD-10-CM | POA: Diagnosis not present

## 2016-03-20 DIAGNOSIS — C44329 Squamous cell carcinoma of skin of other parts of face: Secondary | ICD-10-CM | POA: Diagnosis not present

## 2016-03-26 ENCOUNTER — Other Ambulatory Visit: Payer: Self-pay | Admitting: Physician Assistant

## 2016-03-26 DIAGNOSIS — C060 Malignant neoplasm of cheek mucosa: Secondary | ICD-10-CM | POA: Diagnosis not present

## 2016-03-26 DIAGNOSIS — Z87891 Personal history of nicotine dependence: Secondary | ICD-10-CM | POA: Diagnosis not present

## 2016-03-26 DIAGNOSIS — I10 Essential (primary) hypertension: Secondary | ICD-10-CM

## 2016-03-26 DIAGNOSIS — Z85828 Personal history of other malignant neoplasm of skin: Secondary | ICD-10-CM | POA: Diagnosis not present

## 2016-03-26 MED ORDER — CARVEDILOL 3.125 MG PO TABS: 3.1250 mg | ORAL_TABLET | Freq: Two times a day (BID) | ORAL | 2 refills | Status: DC

## 2016-04-02 ENCOUNTER — Other Ambulatory Visit: Payer: Self-pay | Admitting: Internal Medicine

## 2016-04-03 ENCOUNTER — Other Ambulatory Visit: Payer: Self-pay | Admitting: *Deleted

## 2016-04-03 DIAGNOSIS — C060 Malignant neoplasm of cheek mucosa: Secondary | ICD-10-CM | POA: Diagnosis not present

## 2016-04-03 DIAGNOSIS — M7989 Other specified soft tissue disorders: Secondary | ICD-10-CM | POA: Diagnosis not present

## 2016-04-03 DIAGNOSIS — I2510 Atherosclerotic heart disease of native coronary artery without angina pectoris: Secondary | ICD-10-CM | POA: Diagnosis not present

## 2016-04-03 DIAGNOSIS — I10 Essential (primary) hypertension: Secondary | ICD-10-CM | POA: Diagnosis not present

## 2016-04-03 DIAGNOSIS — Z01818 Encounter for other preprocedural examination: Secondary | ICD-10-CM | POA: Diagnosis not present

## 2016-04-03 DIAGNOSIS — E119 Type 2 diabetes mellitus without complications: Secondary | ICD-10-CM | POA: Diagnosis not present

## 2016-04-03 DIAGNOSIS — R590 Localized enlarged lymph nodes: Secondary | ICD-10-CM | POA: Diagnosis not present

## 2016-04-03 DIAGNOSIS — R600 Localized edema: Secondary | ICD-10-CM | POA: Diagnosis not present

## 2016-04-03 DIAGNOSIS — Z7984 Long term (current) use of oral hypoglycemic drugs: Secondary | ICD-10-CM | POA: Diagnosis not present

## 2016-04-03 DIAGNOSIS — Z951 Presence of aortocoronary bypass graft: Secondary | ICD-10-CM | POA: Diagnosis not present

## 2016-04-03 DIAGNOSIS — E042 Nontoxic multinodular goiter: Secondary | ICD-10-CM | POA: Diagnosis not present

## 2016-04-03 DIAGNOSIS — K589 Irritable bowel syndrome without diarrhea: Secondary | ICD-10-CM | POA: Diagnosis not present

## 2016-04-03 DIAGNOSIS — M459 Ankylosing spondylitis of unspecified sites in spine: Secondary | ICD-10-CM | POA: Diagnosis not present

## 2016-04-03 DIAGNOSIS — Z7982 Long term (current) use of aspirin: Secondary | ICD-10-CM | POA: Diagnosis not present

## 2016-04-03 DIAGNOSIS — E785 Hyperlipidemia, unspecified: Secondary | ICD-10-CM | POA: Diagnosis not present

## 2016-04-03 DIAGNOSIS — I4510 Unspecified right bundle-branch block: Secondary | ICD-10-CM | POA: Diagnosis not present

## 2016-04-03 DIAGNOSIS — J984 Other disorders of lung: Secondary | ICD-10-CM | POA: Diagnosis not present

## 2016-04-03 DIAGNOSIS — Z85828 Personal history of other malignant neoplasm of skin: Secondary | ICD-10-CM | POA: Diagnosis not present

## 2016-04-03 DIAGNOSIS — M452 Ankylosing spondylitis of cervical region: Secondary | ICD-10-CM | POA: Diagnosis not present

## 2016-04-03 DIAGNOSIS — Z87891 Personal history of nicotine dependence: Secondary | ICD-10-CM | POA: Diagnosis not present

## 2016-04-03 MED ORDER — MESALAMINE 1.2 G PO TBEC: DELAYED_RELEASE_TABLET | ORAL | 2 refills | Status: DC

## 2016-04-10 DIAGNOSIS — M459 Ankylosing spondylitis of unspecified sites in spine: Secondary | ICD-10-CM | POA: Diagnosis not present

## 2016-04-10 DIAGNOSIS — E785 Hyperlipidemia, unspecified: Secondary | ICD-10-CM | POA: Diagnosis not present

## 2016-04-10 DIAGNOSIS — Z7984 Long term (current) use of oral hypoglycemic drugs: Secondary | ICD-10-CM | POA: Diagnosis not present

## 2016-04-10 DIAGNOSIS — Z87891 Personal history of nicotine dependence: Secondary | ICD-10-CM | POA: Diagnosis not present

## 2016-04-10 DIAGNOSIS — Z7982 Long term (current) use of aspirin: Secondary | ICD-10-CM | POA: Diagnosis not present

## 2016-04-10 DIAGNOSIS — K589 Irritable bowel syndrome without diarrhea: Secondary | ICD-10-CM | POA: Diagnosis not present

## 2016-04-10 DIAGNOSIS — E119 Type 2 diabetes mellitus without complications: Secondary | ICD-10-CM | POA: Diagnosis not present

## 2016-04-10 DIAGNOSIS — Z85828 Personal history of other malignant neoplasm of skin: Secondary | ICD-10-CM | POA: Diagnosis not present

## 2016-04-10 DIAGNOSIS — C770 Secondary and unspecified malignant neoplasm of lymph nodes of head, face and neck: Secondary | ICD-10-CM | POA: Diagnosis not present

## 2016-04-10 DIAGNOSIS — I10 Essential (primary) hypertension: Secondary | ICD-10-CM | POA: Diagnosis not present

## 2016-04-10 DIAGNOSIS — Z951 Presence of aortocoronary bypass graft: Secondary | ICD-10-CM | POA: Diagnosis not present

## 2016-04-10 DIAGNOSIS — I4510 Unspecified right bundle-branch block: Secondary | ICD-10-CM | POA: Diagnosis not present

## 2016-04-10 DIAGNOSIS — C060 Malignant neoplasm of cheek mucosa: Secondary | ICD-10-CM | POA: Diagnosis not present

## 2016-04-10 DIAGNOSIS — I2510 Atherosclerotic heart disease of native coronary artery without angina pectoris: Secondary | ICD-10-CM | POA: Diagnosis not present

## 2016-04-18 DIAGNOSIS — C060 Malignant neoplasm of cheek mucosa: Secondary | ICD-10-CM | POA: Diagnosis not present

## 2016-04-25 DIAGNOSIS — Z483 Aftercare following surgery for neoplasm: Secondary | ICD-10-CM | POA: Diagnosis not present

## 2016-04-25 DIAGNOSIS — C060 Malignant neoplasm of cheek mucosa: Secondary | ICD-10-CM | POA: Diagnosis not present

## 2016-04-27 ENCOUNTER — Encounter: Payer: Self-pay | Admitting: Radiation Oncology

## 2016-04-28 ENCOUNTER — Other Ambulatory Visit: Payer: Self-pay | Admitting: Cardiovascular Disease

## 2016-05-07 NOTE — Progress Notes (Signed)
Head and Neck Cancer Location of Tumor / Histology:  03/08/16 Per review of pathology 1. pT2 pN2a invasive squamous cell carcinoma, moderately differentiated, keratinizing. Negative margins. PNI present. Extranodal extension present.   Patient presented  months ago with symptoms of: He presented to Dr. [name] on 03/26/16 after being referred by Dr. Benson Norway. He had first noticed the left oral lesion this past December.   Biopsies of  Left buccal mucosa and Left neck resection revealed: Invasive squamous cell carcinoma, moderately differentiated, keratininzing. He had negative margins after surgery.   Nutrition Status Yes No Comments  Weight changes? [x]  []  He has lost 20 lbs since December  Swallowing concerns? [x]  []  He is eating softer foods, and takes smaller bites related to surgery.   PEG? []  [x]     Referrals Yes No Comments  Social Work? []  [x]    Dentistry? []  [x]    Swallowing therapy? []  [x]    Nutrition? []  [x]    Med/Onc? []  [x]  None   Safety Issues Yes No Comments  Prior radiation? []  [x]    Pacemaker/ICD? []  [x]    Possible current pregnancy? []  [x]    Is the patient on methotrexate? []  [x]     Tobacco/Marijuana/Snuff/ETOH use: He is a former cigarette smoker. He has one drink nightly.  Past/Anticipated interventions by otolaryngology, if any:  04/10/16 Nelda Severe. [place] Procedures/Surgeries performed during hospitalization:  RESECTION OF ORAL BUCCAL MUCOSA SELECTIVE NECK DISSECTION FREE FLAP RADIAL FOREARM PR SPLIT GRFT TRUNK,ARM,LEG <100 SQCM [15100] (SPLIT THICKNESS SKIN GRAFT LEG)    Office visit on 04/25/16: Per review of pathology 1. pT2 pN2a invasive squamous cell carcinoma, moderately differentiated, keratinizing. Negative margins. PNI present. Extranodal extension present.--Adjuvant XRT is indicated to decrease risk of local recurrence.     Past/Anticipated interventions by medical oncology, if any:  None   Current Complaints / other details:    BP (!) 116/50    Pulse (!) 53   Temp 97.8 F (36.6 C)   Ht 5\' 8"  (1.727 m)   Wt 183 lb 9.6 oz (83.3 kg)   SpO2 98% Comment: room air  BMI 27.92 kg/m    Wt Readings from Last 3 Encounters:  05/09/16 183 lb 9.6 oz (83.3 kg)  02/01/16 201 lb (91.2 kg)  01/11/16 202 lb (91.6 kg)

## 2016-05-08 ENCOUNTER — Telehealth: Payer: Self-pay | Admitting: *Deleted

## 2016-05-08 NOTE — Telephone Encounter (Signed)
Oncology Nurse Navigator Documentation  Placed introductory call to new referral patient.  Introduced myself as the H&N oncology nurse navigator that works with Dr. Isidore Moos to whom he has been referred by Dr. [name], [facility], for post-surgical adjuvant radiotherapy.  He confirmed his understanding of referral and appt date/time of 5/9 2:00 NE, 2:30 [facility] consult..  I briefly explained my role as his navigator, indicated that I would be joining him tomorrow during his appt next week.  I confirmed his understanding of [place] location, explained arrival and RadOnc registration process for appt.  I provided my contact information, encouraged him to call with questions/concerns before appt. He verbalized understanding of information provided, expressed appreciation for my call.  Gayleen Orem, RN, BSN, [place] Neck Oncology Nurse [place] at [place] [phone]

## 2016-05-09 ENCOUNTER — Encounter: Payer: Self-pay | Admitting: Radiation Oncology

## 2016-05-09 ENCOUNTER — Encounter: Payer: Self-pay | Admitting: *Deleted

## 2016-05-09 ENCOUNTER — Ambulatory Visit
Admission: RE | Admit: 2016-05-09 | Discharge: 2016-05-09 | Disposition: A | Discharge status: Home / Self Care | Payer: PPO | Source: Ambulatory Visit | Attending: Radiation Oncology | Admitting: Radiation Oncology

## 2016-05-09 DIAGNOSIS — K5090 Crohn's disease, unspecified, without complications: Secondary | ICD-10-CM | POA: Diagnosis not present

## 2016-05-09 DIAGNOSIS — E785 Hyperlipidemia, unspecified: Secondary | ICD-10-CM | POA: Insufficient documentation

## 2016-05-09 DIAGNOSIS — Z8261 Family history of arthritis: Secondary | ICD-10-CM | POA: Diagnosis not present

## 2016-05-09 DIAGNOSIS — Z87891 Personal history of nicotine dependence: Secondary | ICD-10-CM | POA: Insufficient documentation

## 2016-05-09 DIAGNOSIS — Z510 Encounter for antineoplastic radiation therapy: Secondary | ICD-10-CM | POA: Insufficient documentation

## 2016-05-09 DIAGNOSIS — Z8249 Family history of ischemic heart disease and other diseases of the circulatory system: Secondary | ICD-10-CM | POA: Insufficient documentation

## 2016-05-09 DIAGNOSIS — I10 Essential (primary) hypertension: Secondary | ICD-10-CM | POA: Diagnosis not present

## 2016-05-09 DIAGNOSIS — K760 Fatty (change of) liver, not elsewhere classified: Secondary | ICD-10-CM | POA: Insufficient documentation

## 2016-05-09 DIAGNOSIS — Z951 Presence of aortocoronary bypass graft: Secondary | ICD-10-CM | POA: Insufficient documentation

## 2016-05-09 DIAGNOSIS — R011 Cardiac murmur, unspecified: Secondary | ICD-10-CM | POA: Diagnosis not present

## 2016-05-09 DIAGNOSIS — Z87442 Personal history of urinary calculi: Secondary | ICD-10-CM | POA: Diagnosis not present

## 2016-05-09 DIAGNOSIS — R42 Dizziness and giddiness: Secondary | ICD-10-CM | POA: Diagnosis not present

## 2016-05-09 DIAGNOSIS — I2510 Atherosclerotic heart disease of native coronary artery without angina pectoris: Secondary | ICD-10-CM | POA: Diagnosis not present

## 2016-05-09 DIAGNOSIS — Z85828 Personal history of other malignant neoplasm of skin: Secondary | ICD-10-CM | POA: Diagnosis not present

## 2016-05-09 DIAGNOSIS — Z931 Gastrostomy status: Secondary | ICD-10-CM | POA: Diagnosis not present

## 2016-05-09 DIAGNOSIS — Z7982 Long term (current) use of aspirin: Secondary | ICD-10-CM | POA: Diagnosis not present

## 2016-05-09 DIAGNOSIS — K1230 Oral mucositis (ulcerative), unspecified: Secondary | ICD-10-CM | POA: Diagnosis not present

## 2016-05-09 DIAGNOSIS — E042 Nontoxic multinodular goiter: Secondary | ICD-10-CM | POA: Insufficient documentation

## 2016-05-09 DIAGNOSIS — C060 Malignant neoplasm of cheek mucosa: Secondary | ICD-10-CM | POA: Insufficient documentation

## 2016-05-09 DIAGNOSIS — K5900 Constipation, unspecified: Secondary | ICD-10-CM | POA: Diagnosis not present

## 2016-05-09 DIAGNOSIS — Z7984 Long term (current) use of oral hypoglycemic drugs: Secondary | ICD-10-CM | POA: Diagnosis not present

## 2016-05-09 DIAGNOSIS — Z79899 Other long term (current) drug therapy: Secondary | ICD-10-CM | POA: Insufficient documentation

## 2016-05-09 DIAGNOSIS — K219 Gastro-esophageal reflux disease without esophagitis: Secondary | ICD-10-CM | POA: Insufficient documentation

## 2016-05-09 DIAGNOSIS — E119 Type 2 diabetes mellitus without complications: Secondary | ICD-10-CM | POA: Insufficient documentation

## 2016-05-09 NOTE — Progress Notes (Signed)
Radiation Oncology         [phone] ________________________________  Initial outpatient Consultation  Name: Andres Smith MRN: [id number]  Date: 05/09/2016  DOB: [birth year]  VF:IEPPI, [name], MD  [name], MD   REFERRING PHYSICIAN: [name], MD  DIAGNOSIS:    ICD-9-CM ICD-10-CM   1. 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 male who was noted to have a lesion in the left buccal mucosa by an oral surgeon, the patient had first noticed it in December. This was determined to be a cancer.   Pertinent imaging thus far includes CT Neck/Thyroid with contrast performed on 04/03/16 at [facility]. There was some motion that limited the quality of the exam. There was mucosal enhancement and soft tissue thickening noted in the left buccal mucosa of the anterior oral cavity, possibly involving the left buccinator muscle suspicious for the primary malignancy. Also seen was enlarged, round heterogeneous left level 1B lymph node measuring 18 mm suspicious for regional nodal metastasis. There are additional left greater than right cervical chain lymph nodes which are not enlarged by CT criteria. Hypodense bilateral thyroid lobe nodules measuring 20 mm on the left. 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  Subsequently, the patient saw Dr. Fenton Malling at [facility] who performed resection of oral buccal mucosa, selective neck dissection on 04/10/16. Free flap radial forearm  reconstruction was not necessary.   Pathology of left buccal oral cavity on 04/10/16 revealed: Invasive squamous cell carcinoma, moderately differentiated, keratinizing. The tumor size was 2.2 cm and depth of invasion was 0.8 cm. Perineural invasion identified. +LVSI. 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 Tumor deposit size was 1.1 cm. Extranodal extension present, with benign salivary gland tissue. Pathologic stage was pT2 pN2a.  Dr. [name] referred him here to discuss adjuvant radiation.    The patient is accompanied by his wife today. He enjoys playing golf in his spare time. He lives in [place], close to the [employer] He wears full dentures. He reports an enlarged liver at baseline.   Swallowing issues, if any: He denies trouble swallowing but, he has difficulty opening his mouth; He is eating softer, smaller foods, and takes small bites related to surgery.  Weight Changes: Yes; He has lost 20 lbs since December.  Pain status: Denies pain.  Other symptoms: He has no other concerns or complaints at this time.  Tobacco history, if any: He is a former cigarette smoker and quit smoking about 25 years ago. He quit chewing tobacco about 8 years ago. He does admit to smoking occasional cigars with the last one before Christmas. He notes he chewed tobacco and placed cigars/cigarettes on the left side of his mouth.  ETOH abuse, if any: He reports two drinks a day or about 2-3 ounces on average.  Possibly more.  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    PAST SURGICAL HISTORY: Past Surgical History:  Procedure Laterality Date  . cancer removal  2014   removed from left outter leg  . COLONOSCOPY  2012  . CORONARY ARTERY BYPASS GRAFT  04/19/1995   x7  . HERNIA REPAIR  1989  . LEFT HEART CATHETERIZATION WITH CORONARY/GRAFT ANGIOGRAM N/A 05/24/2011   Procedure: LEFT HEART CATHETERIZATION WITH Beatrix Fetters;  Surgeon: Larey Dresser, MD;  Location: [facility] CATH LAB;  Service: Cardiovascular;  Laterality: N/A;  . MOHS SURGERY     X 2 off chin and nose  . TONSILLECTOMY AND ADENOIDECTOMY  1944  . TRIGGER FINGER RELEASE    . UMBILICAL HERNIA REPAIR      FAMILY HISTORY: family history includes Arthritis in his mother; Cancer in his paternal grandmother; Heart disease in his father, mother, and sister.  SOCIAL HISTORY:  reports that he quit smoking about 33 years ago. He quit smokeless tobacco use about 6 years ago. His smokeless tobacco use included Chew. He reports that he drinks alcohol. He reports that he does not use drugs.  ALLERGIES: Patient has no known allergies.  MEDICATIONS:  Current Outpatient Prescriptions  Medication Sig Dispense Refill  . aspirin 81 MG tablet Take 81 mg by mouth daily.      Marland Kitchen atorvastatin (LIPITOR) 80 MG tablet TAKE 1 TABLET AT 6PM. 90 tablet 1  . carvedilol (COREG) 3.125 MG tablet Take 1 tablet (3.125 mg total) by mouth 2 (two) times daily with a meal. 180 tablet 2  . cyanocobalamin (,VITAMIN B-12,) 1000 MCG/ML injection INJECT 1ML ONCE MONTHLY. 10 mL 1  . losartan (COZAAR) 100 MG tablet Take 1 tablet (100 mg total) by mouth daily. 90 tablet 3  . magnesium oxide (MAG-OX) 400 MG tablet Take 400 mg by mouth.    . mercaptopurine (PURINETHOL) 50 MG tablet TAKE ONE TABLET ONCE A DAY ON AN EMPTY STOMACH 1 HR BEFORE OR 2HRS AFTER A MEAL 30 tablet 2  . mesalamine  (LIALDA) 1.2 g EC tablet TAKE 2 TABLETS EVERY DAY WITH BREAKFAST 60 tablet 2  . metFORMIN (GLUCOPHAGE) 500 MG tablet Take 1 tablet (500 mg total) by mouth 2 (two) times daily with a meal. Must Keep 05/16/15 appt for future refills 180 tablet 0  . NITROSTAT 0.4 MG SL tablet ONE TAB UNDER TONGUE-MAY REPEAT FOR 2 DOSES EVERY 5 MIN X3TIMES-IF NORESPONSE CALL 911 25 tablet 4  . SYRINGE-NEEDLE, DISP, 3 ML (B-D 3CC LUER-LOK SYR 25GX5/8") 25G X 5/8" 3 ML MISC Use to inject b12 montly 3 each 3  . isosorbide mononitrate (IMDUR) 60 MG 24 hr tablet Take 1.5 tablets (90 mg total) by mouth daily. 135 tablet 3   No current facility-administered medications for this encounter.     REVIEW OF SYSTEMS:  Notable for that above.   PHYSICAL EXAM:  height is 5\' 8"  (1.727 m) and weight is 183 lb 9.6 oz (83.3 kg). His temperature is 97.8 F (36.6 C). His blood pressure is 116/50 (abnormal) and his pulse is 53 (abnormal). His oxygen saturation is 98%.   General: Alert and oriented, in no acute distress HEENT: Head is normocephalic. Extraocular movements are intact. Oropharynx is notable for  healing horizontal scar from the left lateral commissure by his lips extending 3 cm into the buccal mucosa Neck: Neck is notable for scar along left neck has healed well and has mild lymphedema. Heart: Regular in rate. Soft systolic murmur throughout the precordium and irregular rhythm. Chest: Clear to auscultation bilaterally, with no rhonchi, wheezes, or rales. Abdomen: Firm, slightly tender, nondistended, with no rigidity or guarding.  Extremities: No cyanosis. Mild lower extremity pitting edema bilaterally. Lymphatics: see Neck Exam Skin: No concerning lesions. Musculoskeletal: symmetric strength and muscle tone throughout. Neurologic: Cranial nerves II through XII are grossly intact. No obvious focalities. Speech is fluent. Coordination is intact. Psychiatric: Judgment and insight are intact. Affect is appropriate.  ECOG =  1  0 - Asymptomatic (Fully active, able to carry on all predisease activities without restriction)  1 - Symptomatic but completely ambulatory (Restricted in physically strenuous activity but ambulatory and able to carry out work of a light or sedentary nature. For example, light housework, office work)  2 - Symptomatic, <50% in bed during the day (Ambulatory and capable of all self care but unable to carry out any work activities. Up and about more than 50% of waking hours)  3 - Symptomatic, >50% in bed, but not bedbound (Capable of only limited self-care, confined to bed or chair 50% or more of waking hours)  4 - Bedbound (Completely disabled. Cannot carry on any self-care. Totally confined to bed or chair)  5 - Death   Eustace Pen MM, Creech RH, Tormey DC, et al. [phone]). "Toxicity and response criteria of the [facility] Group". [place] Oncol. 5 (6): 649-55   LABORATORY DATA:  Lab Results  Component Value Date   WBC 4.1 10/03/2015   HGB 12.8 (L) 10/03/2015   HCT 37.3 (L) 10/03/2015   MCV 106.0 (H) 10/03/2015   PLT 164.0 10/03/2015   CMP     Component Value Date/Time   NA 138 10/17/2015 1139   K 4.3 10/17/2015 1139   CL 103 10/17/2015 1139   CO2 29 10/17/2015 1139   GLUCOSE 174 (H) 10/17/2015 1139   GLUCOSE 98 12/12/2005 0729   BUN 14 10/17/2015 1139   CREATININE 0.94 10/17/2015 1139   CREATININE 0.96 12/03/2014 0833   CALCIUM 9.0 10/17/2015 1139   PROT 6.7 10/17/2015 1139   ALBUMIN 4.2 10/17/2015 1139   AST 13 10/17/2015 1139   ALT 10 10/17/2015 1139   ALKPHOS 41 10/17/2015 1139   BILITOT 1.0 10/17/2015 1139   GFRNONAA 82 (L) 05/25/2011 1210   GFRAA >90 05/25/2011 1210         RADIOGRAPHY:  As above - we are obtaining outside CT images     IMPRESSION/PLAN: Pathologic Stage pT2 pN2a Invasive squamous cell carcinoma of the Left buccal mucosa with LVSI ECE AND PNI  This is a delightful patient with high risk post op head and neck cancer. I would  recommend 6 wks of adjuvant radiotherapy for this patient.  We discussed the potential risks, benefits, and side effects of radiotherapy. We talked in detail about acute and late effects. We discussed that some of the most bothersome acute effects may be mucositis, dysgeusia, salivary changes, skin irritation, hair loss, dehydration, weight loss and fatigue. We talked about late effects which include but are not necessarily limited to dysphagia, hypothyroidism, nerve injury, spinal cord injury, xerostomia, trismus, and neck edema. No guarantees of treatment were given. A consent form was signed and placed in the patient's medical record. The patient  is enthusiastic about proceeding with treatment. I look forward to participating in the patient's care.    I will order a baseline CT Chest, Neck, and Abdomen to take place in the next few days. He needs this for staging purposes.   Simulation (treatment planning) will take place the beginning of next week.   I reviewed the benefits of alcohol cessation with the patient. We will continue to discuss this topic at future visits. I explained ETOH probably played a role as a risk factor for his cancer. He does not appear motivated to quit right now.  I applauded him on quitting tobacco.   We also discussed that the treatment of head and neck cancer is a multidisciplinary process to maximize treatment outcomes and quality of life. For this reasons the following referrals have been or will be made:  Will refer to Medical oncology to discuss chemotherapy for +ECE -- not sure if comorbidities will allow this  The patient has full dentures. He will not need a referral to Dentistry for dental evaluation, possible extractions in the radiation fields, and /or advice on reducing risk of cavities, osteoradionecrosis, or other oral issues.  + Nutritionist for nutrition support during and after treatment.  + Speech language pathology for swallowing and/or speech  therapy.  + Social work for social support.   Will refer to Physical therapy due to risk of lymphedema in neck and deconditioning, and difficulty opening mouth.  + Baseline labs including TSH. Lab Results  Component Value Date   TSH 2.34 05/16/2015   Will CC note to Eilleen Kempf, the patient's PCP, to evaluate soft systolic murmur and irregular rhythm. Patient states he sees cardiologist, Dr. Sherren Mocha, twice a year. Will CC note to cardiologist, as well.  Patient knows to discuss this with PCP and or cardiology.  I spent 60 minutes face to face with the patient, over 50% of this time on counseling and care coordination. __________________________________________   [name], MD  This document serves as a record of services personally performed by [name], MD. It was created on her behalf by Arlyce Harman, a trained medical scribe. The creation of this record is based on the scribe's personal observations and the provider's statements to them. This document has been checked and approved by the attending provider.

## 2016-05-10 ENCOUNTER — Telehealth: Payer: Self-pay | Admitting: *Deleted

## 2016-05-10 ENCOUNTER — Other Ambulatory Visit: Payer: Self-pay | Admitting: Radiation Oncology

## 2016-05-10 DIAGNOSIS — Z1329 Encounter for screening for other suspected endocrine disorder: Secondary | ICD-10-CM

## 2016-05-10 DIAGNOSIS — C060 Malignant neoplasm of cheek mucosa: Secondary | ICD-10-CM | POA: Insufficient documentation

## 2016-05-10 NOTE — Progress Notes (Signed)
Oncology Nurse Navigator Documentation  Met with Andres Smith during initial consult with Dr. Isidore Moos.  He was accompanied by his wife, [name].   1. Further introduced myself as his Navigator, explained my role as a member of the Care Team.   2. Provided New Patient Information packet, discussed contents:  Contact information for physician(s), myself, other members of the Care Team.  Advance Directive information ([employer] blue pamphlet with LCSW contact info).  He stated he has documents, will bring copy.  Fall Prevention Patient Safety Plan  Appointment Guideline  Financial Assistance Information sheet  [place] with highlight of [place] 3. Provided introductory explanation of radiation treatment including SIM planning and purpose of Aquaplast head and shoulder mask, showed them example.   4. Provided and discussed education handout for PEG.    5. Provided a tour of SIM and Tomo areas, explained treatment and arrival procedures. 6. I encouraged them to contact me with questions/concerns as treatments/procedures begin.  They verbalized understanding of information provided.    Gayleen Orem, RN, BSN, [place] Neck Oncology Nurse [place] at [place] [phone]

## 2016-05-10 NOTE — Telephone Encounter (Signed)
CALLED PATIENT TO INFORM OF CTS FOR 05-11-16- ARRIVAL TIME - 7:45 AM , PT. TO HAVE WATER ONLY STARTING @ 4 AM, TEST TO BE @ [facility], LVM FOR A RETURN CALL

## 2016-05-10 NOTE — Telephone Encounter (Signed)
Oncology Nurse Navigator Documentation  Patient returned my call, I confirmed his attendance at [facility] [place] on 5/22, provided 0800 arrival to Radiation Waiting following registration.  He voiced understanding.  Gayleen Orem, RN, BSN, [place] Neck Oncology Nurse [place] at [place] [phone]

## 2016-05-11 ENCOUNTER — Ambulatory Visit (HOSPITAL_COMMUNITY)
Admission: RE | Admit: 2016-05-11 | Discharge: 2016-05-11 | Disposition: A | Discharge status: Home / Self Care | Payer: PPO | Source: Ambulatory Visit | Attending: Radiation Oncology | Admitting: Radiation Oncology

## 2016-05-11 ENCOUNTER — Encounter (HOSPITAL_COMMUNITY): Payer: Self-pay

## 2016-05-11 ENCOUNTER — Telehealth: Payer: Self-pay

## 2016-05-11 DIAGNOSIS — C060 Malignant neoplasm of cheek mucosa: Secondary | ICD-10-CM | POA: Insufficient documentation

## 2016-05-11 DIAGNOSIS — D0002 Carcinoma in situ of buccal mucosa: Secondary | ICD-10-CM | POA: Diagnosis not present

## 2016-05-11 DIAGNOSIS — M459 Ankylosing spondylitis of unspecified sites in spine: Secondary | ICD-10-CM | POA: Diagnosis not present

## 2016-05-11 DIAGNOSIS — R911 Solitary pulmonary nodule: Secondary | ICD-10-CM | POA: Insufficient documentation

## 2016-05-11 DIAGNOSIS — C770 Secondary and unspecified malignant neoplasm of lymph nodes of head, face and neck: Secondary | ICD-10-CM | POA: Diagnosis not present

## 2016-05-11 DIAGNOSIS — N281 Cyst of kidney, acquired: Secondary | ICD-10-CM | POA: Diagnosis not present

## 2016-05-11 DIAGNOSIS — I700 Atherosclerosis of aorta: Secondary | ICD-10-CM | POA: Insufficient documentation

## 2016-05-11 DIAGNOSIS — E042 Nontoxic multinodular goiter: Secondary | ICD-10-CM | POA: Diagnosis not present

## 2016-05-11 DIAGNOSIS — C760 Malignant neoplasm of head, face and neck: Secondary | ICD-10-CM | POA: Diagnosis not present

## 2016-05-11 DIAGNOSIS — C069 Malignant neoplasm of mouth, unspecified: Secondary | ICD-10-CM | POA: Diagnosis not present

## 2016-05-11 DIAGNOSIS — I2510 Atherosclerotic heart disease of native coronary artery without angina pectoris: Secondary | ICD-10-CM | POA: Insufficient documentation

## 2016-05-11 MED ORDER — IOPAMIDOL (ISOVUE-300) INJECTION 61%
INTRAVENOUS | Status: AC
Start: 2016-05-11 — End: 2016-05-11
  Administered 2016-05-11: 100 mL via INTRAVENOUS
  Filled 2016-05-11: qty 100

## 2016-05-11 NOTE — Telephone Encounter (Signed)
I called and spoke to Andres Smith today. I asked that he not take his metformin on Monday 05/14/16  in preparation for his CT simulation and IV contrast that is scheduled. He voiced his understanding and agreement. He knows to call me if he has any further questions.

## 2016-05-11 NOTE — Progress Notes (Signed)
Has armband been applied?  Yes  Does patient have an allergy to IV contrast dye?:  No   Has patient ever received premedication for IV contrast dye?:  Yes  Does patient take metformin?: Yes  If patient does take metformin when was the last dose: last night  Date of lab work: 04/13/16 at [facility] BUN: 10 CR: 0.74  IV site: Left Forearm  Has IV site been added to flowsheet?  Yes

## 2016-05-14 ENCOUNTER — Encounter: Payer: Self-pay | Admitting: Hematology and Oncology

## 2016-05-14 ENCOUNTER — Ambulatory Visit (HOSPITAL_BASED_OUTPATIENT_CLINIC_OR_DEPARTMENT_OTHER): Payer: PPO | Admitting: Hematology and Oncology

## 2016-05-14 ENCOUNTER — Ambulatory Visit
Admission: RE | Admit: 2016-05-14 | Discharge: 2016-05-14 | Disposition: A | Discharge status: Home / Self Care | Payer: PPO | Source: Ambulatory Visit | Attending: Radiation Oncology | Admitting: Radiation Oncology

## 2016-05-14 ENCOUNTER — Encounter: Payer: Self-pay | Admitting: *Deleted

## 2016-05-14 ENCOUNTER — Telehealth: Payer: Self-pay | Admitting: Hematology and Oncology

## 2016-05-14 DIAGNOSIS — R634 Abnormal weight loss: Secondary | ICD-10-CM | POA: Diagnosis not present

## 2016-05-14 DIAGNOSIS — Z800 Family history of malignant neoplasm of digestive organs: Secondary | ICD-10-CM | POA: Diagnosis not present

## 2016-05-14 DIAGNOSIS — Z87891 Personal history of nicotine dependence: Secondary | ICD-10-CM | POA: Diagnosis not present

## 2016-05-14 DIAGNOSIS — K5010 Crohn's disease of large intestine without complications: Secondary | ICD-10-CM

## 2016-05-14 DIAGNOSIS — R011 Cardiac murmur, unspecified: Secondary | ICD-10-CM | POA: Diagnosis not present

## 2016-05-14 DIAGNOSIS — E119 Type 2 diabetes mellitus without complications: Secondary | ICD-10-CM

## 2016-05-14 DIAGNOSIS — C060 Malignant neoplasm of cheek mucosa: Secondary | ICD-10-CM

## 2016-05-14 DIAGNOSIS — I2510 Atherosclerotic heart disease of native coronary artery without angina pectoris: Secondary | ICD-10-CM

## 2016-05-14 DIAGNOSIS — Z510 Encounter for antineoplastic radiation therapy: Secondary | ICD-10-CM | POA: Diagnosis not present

## 2016-05-14 NOTE — Telephone Encounter (Signed)
Lft a vm for the pt to see if he could see Dr. Alvy Bimler following appt w/Dr. Isidore Moos at 330pm.

## 2016-05-14 NOTE — Progress Notes (Signed)
Head and Neck Cancer Simulation, IMRT treatment planning, and Special treatment procedure note   Outpatient  Diagnosis:    ICD-9-CM ICD-10-CM   1. 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 I connected with Dr [name] and his navigator and he will follow or workup his thyroid as needed in followups after RT is complete.  I'll not slow down his buccal mucosa treatment with a thyroid workup right now. No obvious metastatic disease on imaging, pulmonary nodule is nonspecific and will be followed.  The patient was taken to the CT simulator and laid in the supine position on the table.   An Aquaplast head and shoulder mask was custom fitted to the patient's anatomy. High-resolution CT axial imaging was obtained of the head and neck with contrast. I verified that the quality of the imaging is good for treatment planning. 1 Medically Necessary Treatment Device was fabricated and supervised by me: Aquaplast mask.  Treatment planning note I plan to treat the patient with IMRT. I plan to treat the patient's tumor bed and bilateral neck nodes. I plan to treat to a total dose of 60 Gray in 30  fractions. Dose calculation was ordered from dosimetry.  IMRT planning Note  IMRT is medically necessary and an important modality to deliver adequate dose to the patient's at risk tissues while sparing the patient's normal structures, including the: esophagus, parotid tissue, mandible, brain stem, spinal cord, oral cavity, brachial plexus.  This justifies the use of IMRT in the patient's treatment.   Special Treatment Procedure Note - :  The patient will POSSIBLY be receiving  chemotherapy concurrently. Chemotherapy heightens the risk of side effects. I have considered this during the patient's treatment planning process and will monitor the patient accordingly for side effects on a weekly basis. Concurrent chemotherapy increases the complexity of this patient's treatment and therefore this constitutes a special treatment procedure if he receives chemotherapy.  -----------------------------------  [name], MD

## 2016-05-14 NOTE — Progress Notes (Signed)
Oncology Nurse Navigator Documentation  Met with Andres Smith following CT SIM. Showed him Tomo tmt area, explained registration, arrival to Radiation Waiting and tmt preparation procedures.   He voiced understanding of information provided.  Gayleen Orem, RN, BSN, [place] Neck Oncology Nurse [place] at [place] [phone]

## 2016-05-15 ENCOUNTER — Telehealth: Payer: Self-pay | Admitting: *Deleted

## 2016-05-15 ENCOUNTER — Encounter: Payer: Self-pay | Admitting: Hematology and Oncology

## 2016-05-15 ENCOUNTER — Telehealth: Payer: Self-pay | Admitting: Hematology and Oncology

## 2016-05-15 ENCOUNTER — Other Ambulatory Visit: Payer: Self-pay | Admitting: Hematology and Oncology

## 2016-05-15 DIAGNOSIS — C060 Malignant neoplasm of cheek mucosa: Secondary | ICD-10-CM

## 2016-05-15 NOTE — Assessment & Plan Note (Signed)
He denies recent exacerbation of congestive heart failure.  Continue medical management

## 2016-05-15 NOTE — Assessment & Plan Note (Signed)
Crohn's disease has been stable.  Continue close observation.

## 2016-05-15 NOTE — Telephone Encounter (Signed)
A user error has taken place: encounter opened in error, closed for administrative reasons.

## 2016-05-15 NOTE — Telephone Encounter (Signed)
Does he need PEG tube per Dr. Isidore Moos?

## 2016-05-15 NOTE — Telephone Encounter (Signed)
Oncology Nurse Navigator Documentation  In follow-up to yesterday's consult with Dr. Alvy Bimler, received VM from patient indicating he wishes to proceed with chemotherapy as part of his treatment plan.  Dr. Alvy Bimler notified.  Gayleen Orem, RN, BSN, [place] Neck Oncology Nurse [place] at [place] [phone]

## 2016-05-15 NOTE — Progress Notes (Signed)
[facility] NOTE  Patient Care Team: Janith Lima, MD as PCP - General (Internal Medicine) Sherren Mocha, MD as Consulting Physician (Cardiology) Jarome Matin, MD as Consulting Physician (Dermatology) Pyrtle, Lajuan Lines, MD as Consulting Physician (Gastroenterology) Katy Apo, MD as Consulting Physician (Ophthalmology) [name], MD as Attending Physician (Radiation Oncology) Leota Sauers, RN as Oncology Nurse Navigator Karie Mainland, RD as Dietitian (Nutrition)  CHIEF COMPLAINTS/PURPOSE OF CONSULTATION:  Buccal mucosa cancer, with lymph node metastasis and extracapsular extension, for further management  HISTORY OF PRESENTING ILLNESS:  Andres Smith 76 y.o. male is here because of newly diagnosed buccal mucosa cancer. This patient had history of smoking. He also has  multiple medical comorbidities including ankylosing spondylitis, coronary artery disease status post bypass graft and chronic ulcerated colitis/Crohn's disease.  According to the patient, the first initial presentation was due to lesion in his buccal mucosa. He subsequently underwent further evaluation.  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 Tumor measures 0.9 cm in greatest dimension. Tumor extends to the inked deep tissue edge. Depth of invasion: 4 mm (in this material) See note.  Note: an immunostains for P16 was performed at an outside hospital and provided for [place] to review; p16 immunostain is negative. Per outside report, In situ hybridization for high-risk HPV types showed no evidence of transcriptionally active high-risk HPV types.      03/21/2016 Initial Diagnosis    Salient findings:  -EAC's clear -Lesion within left buccal mucosa (pictured below) with about 42mm depth. Width is  about 2/3 of the left buccal mucosal surface area by palpation. Extends to the oral commissure.  -Tongue soft and mobile without mucosal lesions -Neck soft with no palpable neck masses -Right handed -Right hand is pink and warm with Allen's test negative (normal)  -Left forearm very sun damaged with multiple treated areas from precancerous or cancerous areas. Right forearm not as dramatic, presumably from sun exposure while driving      6/81/1572 Surgery    RESECTION OF ORAL BUCCAL MUCOSA SELECTIVE NECK DISSECTION FREE FLAP RADIAL FOREARM PR SPLIT [place] <100 SQCM [15100] (SPLIT THICKNESS SKIN GRAFT LEG        04/13/2016 Pathology Results    A.LEFT ORAL COMMISSURE INFERIOR, EXCISION: No malignancy identified.  B.LEFT ORAL COMMISSURE SUPERIOR, EXCISION: No malignancy identified.  C.LEFT DEEP ORAL COMMISSURE, EXCISION: No malignancy identified.  D.RIGHT POSTERIOR BUCCAL INFERIOR, EXCISION: No malignancy identified.  E.RIGHT POSTERIOR BUCCAL SUPERIOR, EXCISION: No malignancy identified.  F.ORAL CAVITY, LEFT BUCCAL, EXCISION:  Invasive squamous cell carcinoma, moderately differentiated,  keratinizing. Tumor size:2.2 cm. Tumor depth of invasion:0.8 cm. Perineural invasion identified. Margins negative for malignancy. Pathologic stage:pT2 pN2a. See tumor protocol summary below.  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 Tumor deposit size:1.1 cm. Extranodal extension present. Benign salivary gland tissue.  SURGICAL PATHOLOGY CANCER CASE SUMMARY (AJCC 8TH EDITION) LIP AND ORAL CAVITY CAP Protocol posting date: June, 2017 Version: LipOralCavity 4.0.0.0  PROCEDURE: Wide resection of buccal mucosa TUMOR SITE:Oral:Buccal mucosa TUMOR LATERALITY: Left TUMOR FOCALITY: Unifocal TUMOR  SIZE:Greatest dimension: 2.2 cm TUMOR DEPTH OF INVASION (DOI):8 mm HISTOLOGIC TYPE:Squamous cell carcinoma, conventional HISTOLOGIC GRADE:G2: Moderately differentiated SPECIMEN MARGINS:Uninvolved by invasive carcinoma  DISTANCE FROM CLOSEST MARGIN:1 mm  SPECIFY MARGIN(S):Deep LYMPHOVASCULAR INVASION:Present PERINEURAL INVASION: Present REGIONAL LYMPH NODES: NUMBER OF LYMPH  NODES INVOLVED: 1 NUMBER OF LYMPH NODES EXAMINED: 4 LATERALITY OF LYMPH NODES INVOLVED: Ipsilateral SIZE OF LARGEST METASTATIC DEPOSIT:1.1 cm EXTRANODAL EXTENSION:Present PATHOLOGIC STAGE CLASSIFICATION (pTNM, AJCC 8th Ed): pT2 pN2a pT2: umor >2 cm but <=4 cm and <=10 mm DOI pN2a:Metastasis in a single ipsilateral lymph node, 3 cm or  smaller in greatest dimension and ENE(+) BIOMARKERS:performed on prior outside biopsy [facility] review case [id number], outside case [id number]) P16 (Immunohistochemistry):Negative HPV (high-risk types by in situ hybridization):Reported as negative      05/11/2016 Imaging    1. 4 mm left lower lobe pulmonary nodule. Attention on follow-up imaging recommended as metastatic disease not excluded. 2. 2.8 cm left thyroid nodule. Thyroid ultrasound recommended to further evaluate. 3. Bilateral renal cysts. 4. Coronary artery and thoracoabdominal aortic atherosclerosis. 5. Ankylosing spondylitis.        05/11/2016 Imaging    CT neck: Surgical clips in the region of the left buccal mucosa, presumably at the previous primary site. Mild scarring in that region measuring about 8 mm. Cannot assess for residual or recurrent disease in that location, but this may simply be scarring. Previous left submandibular resection and left neck node dissection. No abnormal nodes presently. Multinodular goiter. Ankylosing spondylitis.  C1-2 remains a mobile segment.      Currently, he denies any hearing deficit, difficulties with chewing food,  swallowing difficulties, painful swallowing, changes in the quality of his voice or oral bleeding.   He had lost 20 pounds weight since diagnosis. With his history of heart disease, he denies recent angina or congestive heart failure. He had mild chronic baseline hearing loss. With his diabetes, he denies significant peripheral neuropathy.  MEDICAL HISTORY:  Past Medical History:  Diagnosis Date  . Ankylosing spondylitis ([place])   . Blood transfusion    hx of transfusion without reaction  . Bradycardia   . Cholelithiasis   . Coronary artery disease    a. s/p CABG x 6 in 1997 (LIMA->LAD, VG->RI ->OM1->OM2, VG->PDA->PLV // b. 05/2011 Cath:  patent grafs, native prox rca and d2 dzs  ->med rx. // c. Myoview 1/18: not gated, large inferolateral scar, no ischemia; Intermediate Risk (IL scar old - on prior studies >> med rx)  . Crohn's colitis ([place]) 12/01/1997  . Diabetes mellitus   . Dysphagia   . Fatty liver   . GERD (gastroesophageal reflux disease)   . Heart block   . Hiatal hernia 12/03/2008  . History of skin cancer   . Hx of echocardiogram    Echo 5/14:  Mild LVH, EF 55-60%, NL diast fxn, mild LAE   . Hyperlipidemia   . Hypertension   . Kidney stones   . Neck pain   . Pneumonia    hx of PNA  . Universal ulcerative (chronic) colitis(556.6) 05/21/2001    SURGICAL HISTORY: Past Surgical History:  Procedure Laterality Date  . cancer removal  2014   removed from left outter leg  . COLONOSCOPY  2012  . CORONARY ARTERY BYPASS GRAFT  04/19/1995   x7  . HERNIA REPAIR  1989  . LEFT HEART CATHETERIZATION WITH CORONARY/GRAFT ANGIOGRAM N/A 05/24/2011   Procedure: LEFT HEART CATHETERIZATION WITH Beatrix Fetters;  Surgeon: Larey Dresser, MD;  Location: [facility] CATH LAB;  Service: Cardiovascular;  Laterality: N/A;  . MOHS SURGERY     X 2 off chin and nose  . TONSILLECTOMY AND ADENOIDECTOMY  1944  . TRIGGER FINGER RELEASE    . UMBILICAL HERNIA REPAIR      SOCIAL HISTORY:  Social  History   Social History  . Marital status: Married    Spouse name: N/A  . Number of children: 2  . Years of education: N/A   Occupational History  . semi retired Sports coach Employed   Social History Main Topics  . Smoking status: Former Smoker    Quit date: 01/02/1983  . Smokeless tobacco: Former Systems developer    Types: Chew    Quit date: 01/01/2010  . Alcohol use Yes     Comment: tottie every night  . Drug use: No  . Sexual activity: Not Currently   Other Topics Concern  . Not on file   Social History Narrative   HSG, [place] - Civil engineering/education in industrial, [place] from [place]. Married '64. 1 son '74, 1 dtr - '69; 1 grandchild, 1 on the way. 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 Lives with wife. ACP -             FAMILY HISTORY: Family History  Problem Relation Age of Onset  . Heart disease Father   . Heart disease Mother   . Arthritis Mother   . Heart disease Unknown   . Crohn's disease Unknown   . Heart disease Sister   . Cancer Paternal Grandmother        colon  . Colon cancer Neg Hx     ALLERGIES:  has No Known Allergies.  MEDICATIONS:  Current Outpatient Prescriptions  Medication Sig Dispense Refill  . aspirin 81 MG tablet Take 81 mg by mouth daily.      Marland Kitchen atorvastatin (LIPITOR) 80 MG tablet TAKE 1 TABLET AT 6PM. 90 tablet 1  . carvedilol (COREG) 3.125 MG tablet Take 1 tablet (3.125 mg total) by mouth 2 (two) times daily with a meal. 180 tablet 2  . cyanocobalamin (,VITAMIN B-12,) 1000 MCG/ML injection INJECT 1ML ONCE MONTHLY. 10 mL 1  . losartan (COZAAR) 100 MG tablet Take 1 tablet (100 mg total) by mouth daily. 90 tablet 3  . magnesium oxide (MAG-OX) 400 MG tablet Take 400 mg by mouth.    . mercaptopurine (PURINETHOL) 50 MG tablet TAKE ONE TABLET ONCE A DAY ON AN  EMPTY STOMACH 1 HR BEFORE OR 2HRS AFTER A MEAL 30 tablet 2  . mesalamine (LIALDA) 1.2 g EC tablet TAKE 2 TABLETS EVERY DAY WITH BREAKFAST 60 tablet 2  . metFORMIN (GLUCOPHAGE) 500 MG tablet Take 1 tablet (500 mg total) by mouth 2 (two) times daily with a meal. Must Keep 05/16/15 appt for future refills 180 tablet 0  . SYRINGE-NEEDLE, DISP, 3 ML (B-D 3CC LUER-LOK SYR 25GX5/8") 25G X 5/8" 3 ML MISC Use to inject b12 montly 3 each 3  . isosorbide mononitrate (IMDUR) 60 MG 24 hr tablet Take 1.5 tablets (90 mg total) by mouth daily. 135 tablet 3  . NITROSTAT 0.4 MG SL tablet ONE TAB UNDER TONGUE-MAY REPEAT FOR 2 DOSES EVERY 5 MIN X3TIMES-IF NORESPONSE CALL 911 (Patient not taking: Reported on 05/14/2016) 25 tablet 4   No current facility-administered medications for this visit.     REVIEW OF SYSTEMS:   Constitutional: Denies fevers, chills or abnormal night sweats Eyes: Denies blurriness of vision, double vision or watery eyes Ears, nose, mouth, throat, and face: Denies mucositis or sore throat Respiratory: Denies cough, dyspnea or wheezes Cardiovascular: Denies palpitation, chest discomfort or  lower extremity swelling Gastrointestinal:  Denies nausea, heartburn or change in bowel habits Skin: Denies abnormal skin rashes Lymphatics: Denies new lymphadenopathy or easy bruising Neurological:Denies numbness, tingling or new weaknesses Behavioral/Psych: Mood is stable, no new changes  All other systems were reviewed with the patient and are negative.  PHYSICAL EXAMINATION: ECOG PERFORMANCE STATUS: 1 - Symptomatic but completely ambulatory  Vitals:   05/14/16 1551  BP: (!) 129/54  Pulse: (!) 58  Resp: 18  Temp: 97.7 F (36.5 C)   Filed Weights   05/14/16 1551  Weight: 186 lb 9.6 oz (84.6 kg)    GENERAL:alert, no distress and comfortable SKIN: skin color, texture, turgor are normal, no rashes or significant lesions EYES: normal, conjunctiva are pink and non-injected, sclera  clear OROPHARYNX:no exudate, no erythema and lips, buccal mucosa, and tongue normal.  Well-healed surgical scars NECK: supple, thyroid normal size, non-tender, without nodularity LYMPH:  no palpable lymphadenopathy in the cervical, axillary or inguinal LUNGS: clear to auscultation and percussion with normal breathing effort HEART: regular rate & rhythm and no murmurs and no lower extremity edema ABDOMEN:abdomen soft, non-tender and normal bowel sounds Musculoskeletal:no cyanosis of digits and no clubbing  PSYCH: alert & oriented x 3 with fluent speech NEURO: no focal motor/sensory deficits  LABORATORY DATA:  I have reviewed the data as listed Lab Results  Component Value Date   WBC 4.1 10/03/2015   HGB 12.8 (L) 10/03/2015   HCT 37.3 (L) 10/03/2015   MCV 106.0 (H) 10/03/2015   PLT 164.0 10/03/2015   Lab Results  Component Value Date   NA 138 10/17/2015   K 4.3 10/17/2015   CL 103 10/17/2015   CO2 29 10/17/2015    RADIOGRAPHIC STUDIES: I have personally reviewed the radiological images as listed and agreed with the findings in the report. Ct Soft Tissue Neck W Contrast  Result Date: 05/11/2016 CLINICAL DATA:  Carcinoma of the left buccal mucosa diagnosed in December of 2017 EXAM: CT NECK WITH CONTRAST TECHNIQUE: Multidetector CT imaging of the neck was performed using the standard protocol following the bolus administration of intravenous contrast. CONTRAST:  150mL ISOVUE-300 IOPAMIDOL (ISOVUE-300) INJECTION 61% COMPARISON:  None. FINDINGS: Pharynx and larynx: Surgical clips in the region of the left buccal mucosa. Mild scarring in that region measuring about 8 mm. No definite actual mass lesion. No other focal mucosal finding. Larynx appears normal. Salivary glands: Left parotid slightly atrophic. Right parotid normal. Right submandibular normal. Left submandibular previously resected. Thyroid: Multinodular goiter with largest nodule inferior on the left measuring 3.1 cm. Lymph nodes:  No enlarged or low-density nodes on either side of the neck. Calcified 4 mm supraclavicular node on the left. Surgical clips related to previous level 2 and level 3 node resections. Vascular: Bilateral carotid calcification. Limited intracranial: Normal except for vascular calcification. Visualized orbits: Normal Mastoids and visualized paranasal sinuses: Clear/normal Skeleton: Ankylosis of the spine consistent with ankylosing spondylitis. Ankylosis of the occiput put and C1. 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 Upper chest: See results of chest CT. Other: None IMPRESSION: Surgical clips in the region of the left buccal mucosa, presumably at the previous primary site. Mild scarring in that region measuring about 8 mm. Cannot assess for residual or recurrent disease in that location, but this may simply be scarring. Previous left submandibular resection and left neck node dissection. No abnormal nodes presently. Multinodular goiter. Ankylosing spondylitis.  C1-2 remains a mobile segment. Electronically Signed   By: Jan Fireman.D.  On: 05/11/2016 09:17   Ct Chest W Contrast  Result Date: 05/11/2016 CLINICAL DATA:  Head and neck cancer. EXAM: CT CHEST, ABDOMEN, WITH CONTRAST TECHNIQUE: Multidetector CT imaging of the chest, abdomen and pelvis was performed following the standard protocol during bolus administration of intravenous contrast. CONTRAST:  117mL ISOVUE-300 IOPAMIDOL (ISOVUE-300) INJECTION 61% COMPARISON:  None. FINDINGS: CT CHEST FINDINGS Cardiovascular: The heart size is normal. No pericardial effusion. Patient is status post CABG. Atherosclerotic calcification is noted in the wall of the thoracic aorta. Mediastinum/Nodes: No mediastinal lymphadenopathy. There is no hilar lymphadenopathy. The esophagus has normal imaging features. 2.8 cm left thyroid nodule associated with small right thyroid nodules. There is no axillary lymphadenopathy. Lungs/Pleura: 4 mm left lower  lobe pulmonary nodule seen image 98 series 7. No focal airspace consolidation. No pulmonary edema or pleural effusion. Musculoskeletal: Bone windows reveal no worrisome lytic or sclerotic osseous lesions. CT ABDOMEN FINDINGS Hepatobiliary: No focal abnormality within the liver parenchyma. 14 mm calcified gallstone. No intrahepatic or extrahepatic biliary dilation. Pancreas: No focal mass lesion. No dilatation of the main duct. No intraparenchymal cyst. No peripancreatic edema. Spleen: No splenomegaly. No focal mass lesion. Adrenals/Urinary Tract: No adrenal nodule or mass. 5.6 cm exophytic cyst right kidney. 4.2 cm left renal cyst associated with another smaller 2.7 cm cyst lower pole left kidney. Small central sinus cysts noted in the kidneys bilaterally Stomach/Bowel: Stomach is nondistended. No gastric wall thickening. No evidence of outlet obstruction. Duodenum is normally positioned as is the ligament of Treitz. Visualize small bowel loops are unremarkable. Abdominal segments of the colon are unremarkable. Vascular/Lymphatic: There is abdominal aortic atherosclerosis without aneurysm. There is no gastrohepatic or hepatoduodenal ligament lymphadenopathy. No intraperitoneal or retroperitoneal lymphadenopathy. Other: No intraperitoneal free fluid. Musculoskeletal: Bone windows reveal no worrisome lytic or sclerotic osseous lesions. Changes of ankylosing spondylitis noted. IMPRESSION: 1. 4 mm left lower lobe pulmonary nodule. Attention on follow-up imaging recommended as metastatic disease not excluded. 2. 2.8 cm left thyroid nodule. Thyroid ultrasound recommended to further evaluate. 3. Bilateral renal cysts. 4. Coronary artery and thoracoabdominal aortic atherosclerosis. 5. Ankylosing spondylitis. Electronically Signed   By: Misty Stanley M.D.   On: 05/11/2016 09:32   Ct Abdomen W Contrast  Result Date: 05/11/2016 CLINICAL DATA:  Head and neck cancer. EXAM: CT CHEST, ABDOMEN, WITH CONTRAST TECHNIQUE:  Multidetector CT imaging of the chest, abdomen and pelvis was performed following the standard protocol during bolus administration of intravenous contrast. CONTRAST:  182mL ISOVUE-300 IOPAMIDOL (ISOVUE-300) INJECTION 61% COMPARISON:  None. FINDINGS: CT CHEST FINDINGS Cardiovascular: The heart size is normal. No pericardial effusion. Patient is status post CABG. Atherosclerotic calcification is noted in the wall of the thoracic aorta. Mediastinum/Nodes: No mediastinal lymphadenopathy. There is no hilar lymphadenopathy. The esophagus has normal imaging features. 2.8 cm left thyroid nodule associated with small right thyroid nodules. There is no axillary lymphadenopathy. Lungs/Pleura: 4 mm left lower lobe pulmonary nodule seen image 98 series 7. No focal airspace consolidation. No pulmonary edema or pleural effusion. Musculoskeletal: Bone windows reveal no worrisome lytic or sclerotic osseous lesions. CT ABDOMEN FINDINGS Hepatobiliary: No focal abnormality within the liver parenchyma. 14 mm calcified gallstone. No intrahepatic or extrahepatic biliary dilation. Pancreas: No focal mass lesion. No dilatation of the main duct. No intraparenchymal cyst. No peripancreatic edema. Spleen: No splenomegaly. No focal mass lesion. Adrenals/Urinary Tract: No adrenal nodule or mass. 5.6 cm exophytic cyst right kidney. 4.2 cm left renal cyst associated with another smaller 2.7 cm cyst lower pole left kidney. Small  central sinus cysts noted in the kidneys bilaterally Stomach/Bowel: Stomach is nondistended. No gastric wall thickening. No evidence of outlet obstruction. Duodenum is normally positioned as is the ligament of Treitz. Visualize small bowel loops are unremarkable. Abdominal segments of the colon are unremarkable. Vascular/Lymphatic: There is abdominal aortic atherosclerosis without aneurysm. There is no gastrohepatic or hepatoduodenal ligament lymphadenopathy. No intraperitoneal or retroperitoneal lymphadenopathy. Other: No  intraperitoneal free fluid. Musculoskeletal: Bone windows reveal no worrisome lytic or sclerotic osseous lesions. Changes of ankylosing spondylitis noted. IMPRESSION: 1. 4 mm left lower lobe pulmonary nodule. Attention on follow-up imaging recommended as metastatic disease not excluded. 2. 2.8 cm left thyroid nodule. Thyroid ultrasound recommended to further evaluate. 3. Bilateral renal cysts. 4. Coronary artery and thoracoabdominal aortic atherosclerosis. 5. Ankylosing spondylitis. Electronically Signed   By: Misty Stanley M.D.   On: 05/11/2016 09:32    ASSESSMENT & PLAN:  Buccal mucosa squamous cell carcinoma (HCC) We discussed multidisciplinary approach to oral cancer. Due to presence of extracapsular extension, the patient is at high risk for recurrence of disease. We discussed addition of weekly chemotherapy as chemosensitization agent with cisplatin. Due to his age and comorbidities, I might start with some minor dose adjustment at 30 mg/m weekly. The risks, benefit, side effects were briefly discussed and he agreed to proceed. We will get him port placement, chemo education class, blood work and see him back next week for chemotherapy consent with plan to start treatment around 05/25/2016.  Coronary atherosclerosis of native coronary artery He denies recent exacerbation of congestive heart failure.  Continue medical management  CROHN'S DISEASE-LARGE INTESTINE Crohn's disease has been stable.  Continue close observation.   All questions were answered. The patient knows to call the clinic with any problems, questions or concerns. I spent 60 minutes counseling the patient face to face. The total time spent in the appointment was 80 minutes and more than 50% was on counseling.     [name], MD 05/15/16 12:57 PM

## 2016-05-15 NOTE — Telephone Encounter (Signed)
Are you ordering PEG?

## 2016-05-15 NOTE — Assessment & Plan Note (Signed)
We discussed multidisciplinary approach to oral cancer. Due to presence of extracapsular extension, the patient is at high risk for recurrence of disease. We discussed addition of weekly chemotherapy as chemosensitization agent with cisplatin. Due to his age and comorbidities, I might start with some minor dose adjustment at 30 mg/m weekly. The risks, benefit, side effects were briefly discussed and he agreed to proceed. We will get him port placement, chemo education class, blood work and see him back next week for chemotherapy consent with plan to start treatment around 05/25/2016.

## 2016-05-15 NOTE — Progress Notes (Signed)
Oncology Nurse Navigator Documentation  Met with Mr Eddleman and his wife during initial consult with Dr. Alvy Bimler.   1. They voiced understanding of discussion re inclusion of chemotherapy as part of treatment plan. 2. He voiced some uncertainty whether he wants to proceed with chemo, agreed to call me tomorrow with decision. 3. Provided and discussed educational handouts for PEG and PAC. I encouraged them to call with questions/concerns, they verbalized understanding.  Gayleen Orem, RN, BSN, [place] Oncology Nurse [place] at [place] [phone]

## 2016-05-15 NOTE — Telephone Encounter (Signed)
I have placed orders for port, chemo class see me next week with plan for weekly chemo to start 5/25

## 2016-05-15 NOTE — Progress Notes (Signed)
START ON PATHWAY REGIMEN - Head and Neck     Administer weekly:     Cisplatin   **Always confirm dose/schedule in your pharmacy ordering system**    Patient Characteristics: Oral Cavity, Stage III, IVA; Resectable, Surgery Followed by Radiation/Postoperative Therapy, ENE or Positive Margins Disease Classification: Oral Cavity AJCC T Category: T2 Current Disease Status: No Distant Metastases and No Recurrent Disease AJCC M Category: M0 AJCC N Category: pN2c AJCC 8 Stage Grouping: IVA  Intent of Therapy: Curative Intent, Discussed with Patient

## 2016-05-15 NOTE — Telephone Encounter (Signed)
Spoke with patient re next appointments for 5/23, 5/24 and 5/25. Patient will get new schedule at 5/23 visit. Andres Smith IR will call re port placement.

## 2016-05-16 ENCOUNTER — Other Ambulatory Visit: Payer: Self-pay | Admitting: Hematology and Oncology

## 2016-05-16 DIAGNOSIS — C060 Malignant neoplasm of cheek mucosa: Secondary | ICD-10-CM

## 2016-05-16 NOTE — Telephone Encounter (Signed)
Oncology Nurse Navigator Documentation  Sent fax to [facility] requesting baseline audiology, requested call/email when appt scheduled, fax copy of report.  'Successful TX Notice' received.  Gayleen Orem, RN, BSN, [place] Oncology Nurse [place] at [place] [phone]

## 2016-05-17 ENCOUNTER — Encounter: Payer: Self-pay | Admitting: *Deleted

## 2016-05-17 ENCOUNTER — Telehealth: Payer: Self-pay | Admitting: *Deleted

## 2016-05-17 NOTE — Telephone Encounter (Signed)
Oncology Nurse Navigator Documentation  Spoke with Joelene Millin [facility], requested Short Stay delivery of PEG Starter Kit for patient's 5/21 AM procedure.  She voiced understanding.  Gayleen Orem, RN, BSN, [place] Oncology Nurse [place] at [place] [phone]

## 2016-05-18 ENCOUNTER — Other Ambulatory Visit: Payer: Self-pay | Admitting: Physician Assistant

## 2016-05-18 DIAGNOSIS — Z510 Encounter for antineoplastic radiation therapy: Secondary | ICD-10-CM | POA: Diagnosis not present

## 2016-05-18 DIAGNOSIS — R011 Cardiac murmur, unspecified: Secondary | ICD-10-CM | POA: Diagnosis not present

## 2016-05-18 DIAGNOSIS — C060 Malignant neoplasm of cheek mucosa: Secondary | ICD-10-CM | POA: Diagnosis not present

## 2016-05-18 NOTE — Progress Notes (Signed)
Oncology Nurse Navigator Documentation  Andres Smith and his wife arrived to [facility] for scheduled PEG education. Using  PEG teaching device   and Teach Back, provided education for PEG use and care, including: hand hygiene, gravity bolus administration of daily water flushes, nutritional supplement, fluids and medications; care of tube insertion site including daily dressing change and cleaning; S&S of infection.  They correctly verbalized dressing change and cleaning procedures, provided correct return demonstration of dressing change and gravity administration of water.  I provided written instructions for PEG flushing/dressing change in support of verbal instruction.  I described contents of PEG Starter Kit that will be delivered to his room at [place] next Monday when he arrives for PEG and PAC placements.    They understand I will be available for ongoing PEG educational support.  Gayleen Orem, RN, BSN, [facility] Nurse [place] [facility] [place] [phone]

## 2016-05-20 ENCOUNTER — Telehealth: Payer: Self-pay | Admitting: Hematology and Oncology

## 2016-05-20 NOTE — Telephone Encounter (Signed)
LVM ADVISING LAB APPT ON 5/23 CX'D PER MD MSG. ALSO ADVISED CHEMO CLASS MOVED FROM 5/24 TO 5/23 AT 10AM PER MD [facility] MSG.

## 2016-05-21 ENCOUNTER — Emergency Department (HOSPITAL_COMMUNITY): Payer: PPO

## 2016-05-21 ENCOUNTER — Encounter (HOSPITAL_COMMUNITY): Payer: Self-pay

## 2016-05-21 ENCOUNTER — Ambulatory Visit (HOSPITAL_COMMUNITY)
Admission: RE | Admit: 2016-05-21 | Discharge: 2016-05-21 | Disposition: A | Discharge status: Home / Self Care | Payer: PPO | Source: Ambulatory Visit | Attending: Hematology and Oncology | Admitting: Hematology and Oncology

## 2016-05-21 ENCOUNTER — Other Ambulatory Visit: Payer: Self-pay | Admitting: Internal Medicine

## 2016-05-21 ENCOUNTER — Encounter (HOSPITAL_COMMUNITY): Payer: Self-pay | Admitting: Emergency Medicine

## 2016-05-21 ENCOUNTER — Other Ambulatory Visit: Payer: Self-pay

## 2016-05-21 ENCOUNTER — Other Ambulatory Visit: Payer: Self-pay | Admitting: Hematology and Oncology

## 2016-05-21 ENCOUNTER — Emergency Department (HOSPITAL_COMMUNITY)
Admission: EM | Admit: 2016-05-21 | Discharge: 2016-05-21 | Disposition: A | Discharge status: Home / Self Care | Payer: PPO | Attending: Emergency Medicine | Admitting: Emergency Medicine

## 2016-05-21 DIAGNOSIS — K219 Gastro-esophageal reflux disease without esophagitis: Secondary | ICD-10-CM | POA: Insufficient documentation

## 2016-05-21 DIAGNOSIS — R1084 Generalized abdominal pain: Secondary | ICD-10-CM | POA: Diagnosis not present

## 2016-05-21 DIAGNOSIS — Z7982 Long term (current) use of aspirin: Secondary | ICD-10-CM | POA: Insufficient documentation

## 2016-05-21 DIAGNOSIS — C060 Malignant neoplasm of cheek mucosa: Secondary | ICD-10-CM

## 2016-05-21 DIAGNOSIS — G8918 Other acute postprocedural pain: Secondary | ICD-10-CM | POA: Insufficient documentation

## 2016-05-21 DIAGNOSIS — I2510 Atherosclerotic heart disease of native coronary artery without angina pectoris: Secondary | ICD-10-CM | POA: Insufficient documentation

## 2016-05-21 DIAGNOSIS — Z431 Encounter for attention to gastrostomy: Secondary | ICD-10-CM | POA: Insufficient documentation

## 2016-05-21 DIAGNOSIS — C760 Malignant neoplasm of head, face and neck: Secondary | ICD-10-CM | POA: Insufficient documentation

## 2016-05-21 DIAGNOSIS — Z7984 Long term (current) use of oral hypoglycemic drugs: Secondary | ICD-10-CM | POA: Insufficient documentation

## 2016-05-21 DIAGNOSIS — Z951 Presence of aortocoronary bypass graft: Secondary | ICD-10-CM | POA: Insufficient documentation

## 2016-05-21 DIAGNOSIS — C4492 Squamous cell carcinoma of skin, unspecified: Secondary | ICD-10-CM | POA: Diagnosis not present

## 2016-05-21 DIAGNOSIS — K760 Fatty (change of) liver, not elsewhere classified: Secondary | ICD-10-CM

## 2016-05-21 DIAGNOSIS — K567 Ileus, unspecified: Secondary | ICD-10-CM

## 2016-05-21 DIAGNOSIS — E119 Type 2 diabetes mellitus without complications: Secondary | ICD-10-CM

## 2016-05-21 DIAGNOSIS — R109 Unspecified abdominal pain: Secondary | ICD-10-CM

## 2016-05-21 DIAGNOSIS — Z87891 Personal history of nicotine dependence: Secondary | ICD-10-CM

## 2016-05-21 DIAGNOSIS — R0602 Shortness of breath: Secondary | ICD-10-CM | POA: Diagnosis not present

## 2016-05-21 DIAGNOSIS — Z931 Gastrostomy status: Secondary | ICD-10-CM

## 2016-05-21 DIAGNOSIS — I10 Essential (primary) hypertension: Secondary | ICD-10-CM | POA: Insufficient documentation

## 2016-05-21 DIAGNOSIS — R140 Abdominal distension (gaseous): Secondary | ICD-10-CM | POA: Diagnosis not present

## 2016-05-21 DIAGNOSIS — M459 Ankylosing spondylitis of unspecified sites in spine: Secondary | ICD-10-CM

## 2016-05-21 DIAGNOSIS — K5010 Crohn's disease of large intestine without complications: Secondary | ICD-10-CM

## 2016-05-21 DIAGNOSIS — Z85819 Personal history of malignant neoplasm of unspecified site of lip, oral cavity, and pharynx: Secondary | ICD-10-CM | POA: Diagnosis not present

## 2016-05-21 DIAGNOSIS — Z5111 Encounter for antineoplastic chemotherapy: Secondary | ICD-10-CM | POA: Diagnosis not present

## 2016-05-21 DIAGNOSIS — Z8249 Family history of ischemic heart disease and other diseases of the circulatory system: Secondary | ICD-10-CM

## 2016-05-21 DIAGNOSIS — E785 Hyperlipidemia, unspecified: Secondary | ICD-10-CM | POA: Insufficient documentation

## 2016-05-21 DIAGNOSIS — D519 Vitamin B12 deficiency anemia, unspecified: Secondary | ICD-10-CM

## 2016-05-21 DIAGNOSIS — Z8589 Personal history of malignant neoplasm of other organs and systems: Secondary | ICD-10-CM | POA: Diagnosis not present

## 2016-05-21 HISTORY — PX: IR US GUIDE VASC ACCESS RIGHT: IMG2390

## 2016-05-21 HISTORY — PX: IR GASTROSTOMY TUBE MOD SED: IMG625

## 2016-05-21 HISTORY — PX: IR FLUORO GUIDE PORT INSERTION RIGHT: IMG5741

## 2016-05-21 LAB — PROTIME-INR
INR: 1.02
PROTHROMBIN TIME: 13.4 s (ref 11.4–15.2)

## 2016-05-21 LAB — COMPREHENSIVE METABOLIC PANEL
ALBUMIN: 4.3 g/dL (ref 3.5–5.0)
ALK PHOS: 50 U/L (ref 38–126)
ALT: 15 U/L — AB (ref 17–63)
ANION GAP: 9 (ref 5–15)
AST: 18 U/L (ref 15–41)
BUN: 12 mg/dL (ref 6–20)
CHLORIDE: 105 mmol/L (ref 101–111)
CO2: 26 mmol/L (ref 22–32)
CREATININE: 0.84 mg/dL (ref 0.61–1.24)
Calcium: 9 mg/dL (ref 8.9–10.3)
GFR calc Af Amer: >60 mL/min (ref >60)
GFR calc non Af Amer: >60 mL/min (ref >60)
GLUCOSE: 140 mg/dL — AB (ref 65–99)
Potassium: 4.3 mmol/L (ref 3.5–5.1)
SODIUM: 140 mmol/L (ref 135–145)
Total Bilirubin: 1.2 mg/dL (ref 0.3–1.2)
Total Protein: 7.1 g/dL (ref 6.5–8.1)

## 2016-05-21 LAB — CBC WITH DIFFERENTIAL/PLATELET
BASOS PCT: 0 %
Basophils Absolute: 0 10*3/uL (ref 0.0–0.1)
EOS ABS: 0 10*3/uL (ref 0.0–0.7)
EOS PCT: 0 %
HCT: 34.8 % — ABNORMAL LOW (ref 39.0–52.0)
HEMOGLOBIN: 12.1 g/dL — AB (ref 13.0–17.0)
Lymphocytes Relative: 6 %
Lymphs Abs: 0.5 10*3/uL — ABNORMAL LOW (ref 0.7–4.0)
MCH: 37 pg — AB (ref 26.0–34.0)
MCHC: 34.8 g/dL (ref 30.0–36.0)
MCV: 106.4 fL — ABNORMAL HIGH (ref 78.0–100.0)
Monocytes Absolute: 0.7 10*3/uL (ref 0.1–1.0)
Monocytes Relative: 9 %
NEUTROS PCT: 85 %
Neutro Abs: 6.9 10*3/uL (ref 1.7–7.7)
PLATELETS: 162 10*3/uL (ref 150–400)
RBC: 3.27 MIL/uL — AB (ref 4.22–5.81)
RDW: 14.8 % (ref 11.5–15.5)
WBC: 8.2 10*3/uL (ref 4.0–10.5)

## 2016-05-21 LAB — APTT: APTT: 28 s (ref 24–36)

## 2016-05-21 LAB — CBC
HCT: 32.7 % — ABNORMAL LOW (ref 39.0–52.0)
Hemoglobin: 11.2 g/dL — ABNORMAL LOW (ref 13.0–17.0)
MCH: 36.4 pg — ABNORMAL HIGH (ref 26.0–34.0)
MCHC: 34.3 g/dL (ref 30.0–36.0)
MCV: 106.2 fL — ABNORMAL HIGH (ref 78.0–100.0)
Platelets: 162 10*3/uL (ref 150–400)
RBC: 3.08 MIL/uL — ABNORMAL LOW (ref 4.22–5.81)
RDW: 14.8 % (ref 11.5–15.5)
WBC: 3.3 10*3/uL — AB (ref 4.0–10.5)

## 2016-05-21 LAB — I-STAT TROPONIN, ED: TROPONIN I, POC: 0 ng/mL (ref 0.00–0.08)

## 2016-05-21 LAB — GLUCOSE, CAPILLARY
GLUCOSE-CAPILLARY: 117 mg/dL — AB (ref 65–99)
GLUCOSE-CAPILLARY: 166 mg/dL — AB (ref 65–99)

## 2016-05-21 MED ORDER — IOPAMIDOL (ISOVUE-300) INJECTION 61%
100.0000 mL | Freq: Once | INTRAVENOUS | Status: AC | PRN
  Administered 2016-05-21: 100 mL via INTRAVENOUS

## 2016-05-21 MED ORDER — FENTANYL CITRATE (PF) 100 MCG/2ML IJ SOLN
INTRAMUSCULAR | Status: AC
  Filled 2016-05-21: qty 4

## 2016-05-21 MED ORDER — HEPARIN SOD (PORK) LOCK FLUSH 100 UNIT/ML IV SOLN
INTRAVENOUS | Status: AC | PRN
  Administered 2016-05-21 (×2): 500 [IU]

## 2016-05-21 MED ORDER — SODIUM CHLORIDE 0.9 % IV SOLN
INTRAVENOUS | Status: DC
  Administered 2016-05-21: 08:00:00 via INTRAVENOUS

## 2016-05-21 MED ORDER — GLUCAGON HCL RDNA (DIAGNOSTIC) 1 MG IJ SOLR
INTRAMUSCULAR | Status: AC
  Filled 2016-05-21: qty 1

## 2016-05-21 MED ORDER — LIDOCAINE HCL 1 % IJ SOLN
INTRAMUSCULAR | Status: AC
  Filled 2016-05-21: qty 20

## 2016-05-21 MED ORDER — IOPAMIDOL (ISOVUE-300) INJECTION 61%
INTRAVENOUS | Status: AC
  Administered 2016-05-21: 10 mL
  Filled 2016-05-21: qty 50

## 2016-05-21 MED ORDER — LIDOCAINE-EPINEPHRINE (PF) 1 %-1:200000 IJ SOLN
INTRAMUSCULAR | Status: AC | PRN
  Administered 2016-05-21: 10 mL

## 2016-05-21 MED ORDER — FLEET ENEMA 7-19 GM/118ML RE ENEM
1.0000 | ENEMA | Freq: Once | RECTAL | Status: AC
  Administered 2016-05-21: 1 via RECTAL
  Filled 2016-05-21: qty 1

## 2016-05-21 MED ORDER — LIDOCAINE-EPINEPHRINE (PF) 2 %-1:200000 IJ SOLN
INTRAMUSCULAR | Status: AC
  Filled 2016-05-21: qty 20

## 2016-05-21 MED ORDER — ALBUTEROL SULFATE (2.5 MG/3ML) 0.083% IN NEBU: 5.0000 mg | INHALATION_SOLUTION | Freq: Once | RESPIRATORY_TRACT | Status: DC

## 2016-05-21 MED ORDER — GLUCAGON HCL RDNA (DIAGNOSTIC) 1 MG IJ SOLR
INTRAMUSCULAR | Status: AC | PRN
  Administered 2016-05-21: 1 mg via INTRAVENOUS

## 2016-05-21 MED ORDER — FENTANYL CITRATE (PF) 100 MCG/2ML IJ SOLN
25.0000 ug | Freq: Once | INTRAMUSCULAR | Status: AC
  Administered 2016-05-21: 25 ug via INTRAVENOUS
  Filled 2016-05-21: qty 2

## 2016-05-21 MED ORDER — SODIUM CHLORIDE 0.9 % IV BOLUS (SEPSIS)
1000.0000 mL | Freq: Once | INTRAVENOUS | Status: AC
  Administered 2016-05-21: 1000 mL via INTRAVENOUS

## 2016-05-21 MED ORDER — IOPAMIDOL (ISOVUE-300) INJECTION 61%
INTRAVENOUS | Status: AC
  Filled 2016-05-21: qty 100

## 2016-05-21 MED ORDER — MIDAZOLAM HCL 2 MG/2ML IJ SOLN
INTRAMUSCULAR | Status: AC
  Filled 2016-05-21: qty 6

## 2016-05-21 MED ORDER — HEPARIN SOD (PORK) LOCK FLUSH 100 UNIT/ML IV SOLN
INTRAVENOUS | Status: AC
  Filled 2016-05-21: qty 5

## 2016-05-21 MED ORDER — CEFAZOLIN SODIUM-DEXTROSE 2-4 GM/100ML-% IV SOLN: 2.0000 g | Freq: Once | INTRAVENOUS | Status: DC

## 2016-05-21 MED ORDER — LIDOCAINE HCL 1 % IJ SOLN
INTRAMUSCULAR | Status: AC | PRN
  Administered 2016-05-21: 10 mL

## 2016-05-21 MED ORDER — MIDAZOLAM HCL 2 MG/2ML IJ SOLN
INTRAMUSCULAR | Status: AC | PRN
  Administered 2016-05-21 (×5): 1 mg via INTRAVENOUS

## 2016-05-21 MED ORDER — LIDOCAINE-EPINEPHRINE (PF) 1 %-1:200000 IJ SOLN
INTRAMUSCULAR | Status: DC | PRN
  Administered 2016-05-21: 10 mL

## 2016-05-21 MED ORDER — FENTANYL CITRATE (PF) 100 MCG/2ML IJ SOLN
INTRAMUSCULAR | Status: AC | PRN
  Administered 2016-05-21: 50 ug via INTRAVENOUS
  Administered 2016-05-21 (×2): 25 ug via INTRAVENOUS

## 2016-05-21 MED ORDER — CEFAZOLIN SODIUM-DEXTROSE 2-4 GM/100ML-% IV SOLN
INTRAVENOUS | Status: AC
  Administered 2016-05-21: 2000 mg
  Filled 2016-05-21: qty 100

## 2016-05-21 NOTE — ED Triage Notes (Signed)
Pt complaint of SOB and pain with breathing since over surgery today; had port a cath and feeding tube placed. 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 Pt pale in color. Denies just pain but keeps repeating "pain when a breathe."

## 2016-05-21 NOTE — Progress Notes (Signed)
Pt complaining of upper abd pain & "hurts to breath" after getting dressed for discharge. Abd distended & firm while upright. Instructed pt to lie down, reassessed abd-soft, GTube in place-CDI. Kim RN notified Rowe Robert PA of pt symptoms. PA currently Not available to assess pt, but sending [facility] Radiology Tech to assess pt and determine discharge readiness. [employer]. Tech assessed pt & deemed pt  appropriate for discharge

## 2016-05-21 NOTE — Discharge Instructions (Signed)
Stay hydrated.   Continue gas X. You may pass some gas after the procedure  See your doctor  Return to ER if you have worse abdominal pain, vomiting, distention, trouble breathing.

## 2016-05-21 NOTE — ED Provider Notes (Signed)
[facility] DEPT Provider Note   CSN: [id number] Arrival date & time: 05/21/16  1626     History   Chief Complaint Chief Complaint  Patient presents with  . Shortness of Breath    HPI Andres Smith is a 76 y.o. male hx of CAD s/p CABG, DM, Oral cancer here presenting with abdominal distention, shortness of breath. Patient is supposed to start chemotherapy and radiation next week. Patient had a port placed and G tube placed by IR this morning. After the procedure, he felt abdominal distention and shortness of breath. He was thought to have gas and was told to pick up some gas X. However, he had more distention and shortness of breath so came for evaluation. He states that he passed some gas after the procedure.   The history is provided by the patient.    Past Medical History:  Diagnosis Date  . Ankylosing spondylitis ([place])   . Blood transfusion    hx of transfusion without reaction  . Bradycardia   . Cancer ([place]) 03/2016   oral  . Cholelithiasis   . Coronary artery disease    a. s/p CABG x 6 in 1997 (LIMA->LAD, VG->RI ->OM1->OM2, VG->PDA->PLV // b. 05/2011 Cath:  patent grafs, native prox rca and d2 dzs  ->med rx. // c. Myoview 1/18: not gated, large inferolateral scar, no ischemia; Intermediate Risk (IL scar old - on prior studies >> med rx)  . Crohn's colitis ([place]) 12/01/1997  . Diabetes mellitus   . Dysphagia   . Fatty liver   . GERD (gastroesophageal reflux disease)   . Heart block   . Hiatal hernia 12/03/2008  . History of skin cancer   . Hx of echocardiogram    Echo 5/14:  Mild LVH, EF 55-60%, NL diast fxn, mild LAE   . Hyperlipidemia   . Hypertension   . Kidney stones   . Neck pain   . Pneumonia    hx of PNA  . 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 B12 deficiency anemia 08/03/2013  . ULCERATIVE COLITIS-UNIVERSAL 01/17/2010    . Occlusion and stenosis of carotid artery without mention of cerebral infarction 09/20/2009  . GERD 12/02/2008  . Type II diabetes mellitus with manifestations ([place]) 04/27/2008  . Hyperlipidemia with target LDL less than 70 12/04/2007  . Essential hypertension, benign 12/04/2007  . Ankylosing spondylitis ([place]) 12/04/2007  . Coronary atherosclerosis of native coronary artery 12/04/2007  . 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 cancer removal  2014   removed from left outter leg  . COLONOSCOPY  2012  . CORONARY ARTERY BYPASS GRAFT  04/19/1995   x7  . EXCISION ORAL TUMOR Left    left jaw and lymph node  . HERNIA REPAIR  1989  . IR FLUORO GUIDE PORT INSERTION RIGHT  05/21/2016  . IR GASTROSTOMY TUBE MOD SED  05/21/2016  . IR US GUIDE VASC ACCESS RIGHT  05/21/2016  . LEFT HEART CATHETERIZATION WITH CORONARY/GRAFT ANGIOGRAM N/A 05/24/2011   Procedure: LEFT HEART CATHETERIZATION WITH Beatrix Fetters;  Surgeon: Larey Dresser, MD;  Location: [facility] CATH LAB;  Service: Cardiovascular;  Laterality: N/A;  . MOHS SURGERY     X 2 off chin and nose  . TONSILLECTOMY AND ADENOIDECTOMY  1944  . TRIGGER FINGER RELEASE    . UMBILICAL HERNIA REPAIR  Home Medications    Prior to Admission medications   Medication Sig Start Date End Date Taking? Authorizing Provider  aspirin EC 81 MG tablet Take 81 mg by mouth daily.   Yes [provider]  atorvastatin (LIPITOR) 80 MG tablet Take 80 mg by mouth at bedtime.   Yes [provider]  carvedilol (COREG) 3.125 MG tablet Take 1 tablet (3.125 mg total) by mouth 2 (two) times daily with a meal. 03/26/16  Yes Weaver, Scott T, PA-C  cyanocobalamin (,VITAMIN B-12,) 1000 MCG/ML injection Inject 1,000 mcg into the muscle every 30 (thirty) days.   Yes [provider]  isosorbide mononitrate (IMDUR) 60 MG 24 hr tablet Take 90 mg by mouth daily.   Yes [provider]  losartan (COZAAR) 100 MG tablet Take 1 tablet (100 mg total) by mouth daily. 02/29/16  Yes Sherren Mocha, MD  mercaptopurine (PURINETHOL) 50 MG tablet TAKE ONE TABLET ONCE A DAY ON AN EMPTY STOMACH 1 HR BEFORE OR 2HRS AFTER A MEAL 04/02/16  Yes Pyrtle, Lajuan Lines, MD  mesalamine (LIALDA) 1.2 g EC tablet TAKE 2 TABLETS EVERY DAY WITH BREAKFAST Patient taking differently: Take 2.4 g by mouth daily with breakfast.  04/03/16  Yes Pyrtle, Lajuan Lines, MD  metFORMIN (GLUCOPHAGE) 500 MG tablet Take 1 tablet (500 mg total) by mouth 2 (two) times daily with a meal. Must Keep 05/16/15 appt for future refills 04/19/15  Yes Janith Lima, MD  naproxen sodium (ANAPROX) 220 MG tablet Take 440 mg by mouth 3 (three) times daily as needed (for pain).   Yes [provider]  nitroGLYCERIN (NITROSTAT) 0.4 MG SL tablet Place 0.4 mg under the tongue every 5 (five) minutes as needed for chest pain.   Yes [provider]  oxyCODONE (OXY IR/ROXICODONE) 5 MG immediate release tablet Take 5 mg by mouth every 6 (six) hours as needed for severe pain.   Yes [provider]    Family History Family History  Problem Relation Age of Onset  . Heart disease Father   . Heart disease Mother   . Arthritis Mother   . Heart disease Unknown   . Crohn's disease Unknown   . Heart disease Sister   . Cancer Paternal Grandmother        colon  . Colon cancer Neg Hx     Social History Social History  Substance Use Topics  . Smoking status: Former Smoker    Quit date: 01/02/1983  . Smokeless tobacco: Former Systems developer    Types: Chew    Quit date: 01/01/2010  . Alcohol use Yes     Comment: tottie every night     Allergies   Patient has no known allergies.   Review of Systems Review of Systems  Respiratory: Positive for shortness of breath.   Gastrointestinal: Positive for abdominal distention.  All other systems reviewed and are negative.    Physical Exam Updated Vital Signs BP 122/67 (BP  Location: Right Arm)   Pulse 79   Resp 20   Ht _0  (1.727 m)   Wt 81.6 kg (180 lb)   SpO2 95%   BMI 27.37 kg/m   Physical Exam  Constitutional: He is oriented to person, place, and time.  Chronically ill   HENT:  Head: Normocephalic.  MM dry, oral cancer present   Eyes: EOM are normal. Pupils are equal, round, and reactive to light.  Neck: Normal range of motion. Neck supple.  Cardiovascular: Normal rate, regular rhythm and  normal heart sounds.   Pulmonary/Chest: Effort normal and breath sounds normal. No respiratory distress. He has no wheezes. He has no rales.  Port in place   Abdominal:  Distended, J tube in place. Mild diffuse tenderness, no rebound   Musculoskeletal: Normal range of motion. He exhibits no edema.  Neurological: He is alert and oriented to person, place, and time. No cranial nerve deficit. Coordination normal.  Skin: Skin is warm.  Psychiatric: He has a normal mood and affect.  Nursing note and vitals reviewed.    ED Treatments / Results  Labs (all labs ordered are listed, but only abnormal results are displayed) Labs Reviewed  CBC WITH DIFFERENTIAL/PLATELET - Abnormal; Notable for the following:       Result Value   RBC 3.27 (*)    Hemoglobin 12.1 (*)    HCT 34.8 (*)    MCV 106.4 (*)    MCH 37.0 (*)    Lymphs Abs 0.5 (*)    All other components within normal limits  COMPREHENSIVE METABOLIC PANEL - Abnormal; Notable for the following:    Glucose, Bld 140 (*)    ALT 15 (*)    All other components within normal limits  I-STAT TROPOININ, ED    EKG  EKG Interpretation  Date/Time:  Monday May 21 2016 16:42:45 EDT Ventricular Rate:  50 PR Interval:    QRS Duration: 156 QT Interval:  464 QTC Calculation: 424 R Axis:   94 Text Interpretation:  Sinus rhythm RBBB and LPFB Abnormal inferior Q waves Baseline wander in lead(s) V2 No significant change since last tracing Confirmed by YAO  MD, DAVID ([id number]) on 05/21/2016 4:58:07 PM        Radiology Ct Abdomen Pelvis W Contrast  Result Date: 05/21/2016 CLINICAL DATA:  Acute onset of shortness of breath and generalized abdominal pain. Recent G-tube placement. Initial encounter. EXAM: CT ABDOMEN AND PELVIS WITH CONTRAST TECHNIQUE: Multidetector CT imaging of the abdomen and pelvis was performed using the standard protocol following bolus administration of intravenous contrast. CONTRAST:  156m ISOVUE-300 IOPAMIDOL (ISOVUE-300) INJECTION 61% COMPARISON:  CT of the abdomen and pelvis performed 05/11/2016 FINDINGS: Lower chest: Diffuse coronary artery calcifications are seen. The patient is status post median sternotomy. Bibasilar atelectasis or scarring is noted. Hepatobiliary: The liver is unremarkable in appearance. A stone is noted dependently within the gallbladder. The gallbladder is otherwise unremarkable. The common bile duct remains normal in caliber. Pancreas: The pancreas is within normal limits. Spleen: The spleen is unremarkable in appearance. Adrenals/Urinary Tract: The adrenal glands are unremarkable in appearance. Bilateral renal cysts are noted. Nonspecific perinephric stranding is noted bilaterally. There is no evidence of hydronephrosis. No renal or ureteral stones are identified. Stomach/Bowel: The patient's G-tube is noted at the body of the stomach. A small amount of free air is seen tracking within the abdomen, reflecting recent surgery. The small bowel is within normal limits. The appendix is not visualized; there is no evidence for appendicitis. There is wall thickening along the distal sigmoid colon and rectum, raising question for mild proctitis. The colon is otherwise unremarkable in appearance. Vascular/Lymphatic: Scattered calcification is seen along the abdominal aorta and its branches. The abdominal aorta is otherwise grossly unremarkable. The inferior vena cava is grossly unremarkable. No retroperitoneal lymphadenopathy is seen. No pelvic sidewall lymphadenopathy  is identified. Reproductive: The bladder is mildly distended. Mild bladder wall thickening could reflect cystitis. The prostate is enlarged, measuring 5.6 cm in transverse dimension, with mild heterogeneity. Other: A small left inguinal  hernia is noted, containing only fat. Musculoskeletal: No acute osseous abnormalities are identified. Anterior bridging osteophytes are noted along the lower thoracic and upper lumbar spine. Facet disease is noted along the lumbar spine. The visualized musculature is unremarkable in appearance. IMPRESSION: 1. Wall thickening along the distal sigmoid colon rectum raises question for mild proctitis. 2. Mild bladder wall thickening could reflect cystitis. 3. Postoperative free air within the abdomen, given recent surgery. G-tube noted ending at the body of the stomach. 4. Diffuse coronary artery calcifications seen. 5. Cholelithiasis.  Gallbladder otherwise unremarkable. 6. Bilateral renal cysts noted. 7. Scattered aortic atherosclerosis. 8. Enlarged prostate, with mild heterogeneity. Would correlate with PSA. 9. Small left inguinal hernia, containing only fat. 10. Anterior bridging osteophytes along the lower thoracic and upper lumbar spine. Electronically Signed   By: Garald Balding M.D.   On: 05/21/2016 19:21   Ir Gastrostomy Tube  Result Date: 05/21/2016 INDICATION: History of head neck cancer, in need of durable intravenous access for chemotherapy administration. In need of enteric access for enteric nutrition supplementation prior to initiation of chemoradiation. EXAM: 1. IMPLANTED PORT A CATH PLACEMENT WITH ULTRASOUND AND FLUOROSCOPIC GUIDANCE 2. FLUOROSCOPIC GUIDED PERCUTANEOUS GASTROSTOMY TUBE PLACEMENT COMPARISON:  CT the chest, abdomen pelvis - 05/12/2018 MEDICATIONS: Glucagon 1 mg IV; Ancef 2 g IV; The antibiotic was administered within an appropriate time interval prior to skin puncture. ANESTHESIA/SEDATION: Moderate (conscious) sedation was employed during this  procedure. A total of Versed 6 mg and Fentanyl 100 mcg was administered intravenously. Moderate Sedation Time: 45 minutes. The patient's level of consciousness and vital signs were monitored continuously by radiology nursing throughout the procedure under my direct supervision. CONTRAST:  20 - cc Isovue 300, administered into the stomach lumen. FLUOROSCOPY TIME:  A total of 1 minute, 54 seconds (38.2 mGy) fluoroscopy time was utilized for both procedures. COMPLICATIONS: None immediate. PROCEDURE: The procedures, risks, benefits, and alternatives were explained to the patient. Questions regarding the procedure were encouraged and answered. The patient understands and consents to both procedures. Attention was initially paid towards placement of the [facility] a Catheter. The right neck and chest were prepped with chlorhexidine in a sterile fashion, and a sterile drape was applied covering the operative field. Maximum barrier sterile technique with sterile gowns and gloves were used for the procedure. A timeout was performed prior to the initiation of the procedure. Local anesthesia was provided with 1% lidocaine with epinephrine. After creating a small venotomy incision, a micropuncture kit was utilized to access the internal jugular vein under direct, real-time ultrasound guidance. Ultrasound image documentation was performed. The microwire was kinked to measure appropriate catheter length. A subcutaneous port pocket was then created along the upper chest wall utilizing a combination of Peeks and blunt dissection. The pocket was irrigated with sterile saline. A single lumen thin power injectable port was chosen for placement. The 8 Fr catheter was tunneled from the port pocket site to the venotomy incision. The port was placed in the pocket. The external catheter was trimmed to appropriate length. At the venotomy, an 8 Fr peel-away sheath was placed over a guidewire under fluoroscopic guidance. The catheter was then  placed through the sheath and the sheath was removed. Final catheter positioning was confirmed and documented with a fluoroscopic spot radiograph. The port was accessed with a Huber needle, aspirated and flushed with heparinized saline. The venotomy site was closed with an interrupted 4-0 Vicryl suture. The port pocket incision was closed with interrupted 2-0 Vicryl suture and the skin  was opposed with a running subcuticular 4-0 Vicryl suture. Dermabond and Steri-strips were applied to both incisions. Dressings were placed. ____________________________________________ Next, attention was paid towards placement of the gastrostomy tube. The left upper quadrant was sterilely prepped and draped. An oral gastric catheter was inserted into the stomach under fluoroscopy. The existing nasogastric feeding tube was removed. The left costal margin and air / barium opacified transverse colon were identified and avoided. Air was injected into the stomach for insufflation and visualization under fluoroscopy. Under sterile conditions a 17 gauge trocar needle was utilized to access the stomach percutaneously beneath the left subcostal margin after the overlying soft tissues were anesthetized with 1% Lidocaine with epinephrine. Needle position was confirmed within the stomach with aspiration of air and injection of small amount of contrast. A single T tack was deployed for gastropexy. Over an Amplatz guide wire, a 9-French sheath was inserted into the stomach. A snare device was utilized to capture the oral gastric catheter. The snare device was pulled retrograde from the stomach up the esophagus and out the oropharynx. The 20-French pull-through gastrostomy was connected to the snare device and pulled antegrade through the oropharynx down the esophagus into the stomach and then through the percutaneous tract external to the patient. The gastrostomy was assembled externally. Contrast injection confirms position in the stomach.  Several spot radiographic images were obtained in various obliquities for documentation. Dressings were placed. The patient tolerated both above procedures well without immediate post procedural complication. FINDINGS: After catheter placement, the tip lies within the superior cavoatrial junction. The catheter aspirates and flushes normally and is ready for immediate use. After successful fluoroscopic guided placement, the gastrostomy tube is appropriately positioned with internal disc against the ventral aspect of the gastric lumen. IMPRESSION: 1. Successful placement of a right internal jugular approach power injectable Port-A-Cath. The Port a catheter is ready for immediate use. 2. Successful fluoroscopic insertion of a 20-French pull-through gastrostomy tube. The gastrostomy may be used immediately for medication administration and may be utilized in 24 hrs for the initiation of feeds. Electronically Signed   By: Sandi Mariscal M.D.   On: 05/21/2016 12:48   Ir US Guide Vasc Access Right  Result Date: 05/21/2016 INDICATION: History of head neck cancer, in need of durable intravenous access for chemotherapy administration. In need of enteric access for enteric nutrition supplementation prior to initiation of chemoradiation. EXAM: 1. IMPLANTED PORT A CATH PLACEMENT WITH ULTRASOUND AND FLUOROSCOPIC GUIDANCE 2. FLUOROSCOPIC GUIDED PERCUTANEOUS GASTROSTOMY TUBE PLACEMENT COMPARISON:  CT the chest, abdomen pelvis - 05/12/2018 MEDICATIONS: Glucagon 1 mg IV; Ancef 2 g IV; The antibiotic was administered within an appropriate time interval prior to skin puncture. ANESTHESIA/SEDATION: Moderate (conscious) sedation was employed during this procedure. A total of Versed 6 mg and Fentanyl 100 mcg was administered intravenously. Moderate Sedation Time: 45 minutes. The patient's level of consciousness and vital signs were monitored continuously by radiology nursing throughout the procedure under my direct supervision. CONTRAST:   20 - cc Isovue 300, administered into the stomach lumen. FLUOROSCOPY TIME:  A total of 1 minute, 54 seconds (38.2 mGy) fluoroscopy time was utilized for both procedures. COMPLICATIONS: None immediate. PROCEDURE: The procedures, risks, benefits, and alternatives were explained to the patient. Questions regarding the procedure were encouraged and answered. The patient understands and consents to both procedures. Attention was initially paid towards placement of the [facility] a Catheter. The right neck and chest were prepped with chlorhexidine in a sterile fashion, and a sterile drape was applied covering  the operative field. Maximum barrier sterile technique with sterile gowns and gloves were used for the procedure. A timeout was performed prior to the initiation of the procedure. Local anesthesia was provided with 1% lidocaine with epinephrine. After creating a small venotomy incision, a micropuncture kit was utilized to access the internal jugular vein under direct, real-time ultrasound guidance. Ultrasound image documentation was performed. The microwire was kinked to measure appropriate catheter length. A subcutaneous port pocket was then created along the upper chest wall utilizing a combination of [place] and blunt dissection. The pocket was irrigated with sterile saline. A single lumen thin power injectable port was chosen for placement. The 8 Fr catheter was tunneled from the port pocket site to the venotomy incision. The port was placed in the pocket. The external catheter was trimmed to appropriate length. At the venotomy, an 8 Fr peel-away sheath was placed over a guidewire under fluoroscopic guidance. The catheter was then placed through the sheath and the sheath was removed. Final catheter positioning was confirmed and documented with a fluoroscopic spot radiograph. The port was accessed with a Huber needle, aspirated and flushed with heparinized saline. The venotomy site was closed with an interrupted 4-0  Vicryl suture. The port pocket incision was closed with interrupted 2-0 Vicryl suture and the skin was opposed with a running subcuticular 4-0 Vicryl suture. Dermabond and Steri-strips were applied to both incisions. Dressings were placed. ____________________________________________ Next, attention was paid towards placement of the gastrostomy tube. The left upper quadrant was sterilely prepped and draped. An oral gastric catheter was inserted into the stomach under fluoroscopy. The existing nasogastric feeding tube was removed. The left costal margin and air / barium opacified transverse colon were identified and avoided. Air was injected into the stomach for insufflation and visualization under fluoroscopy. Under sterile conditions a 17 gauge trocar needle was utilized to access the stomach percutaneously beneath the left subcostal margin after the overlying soft tissues were anesthetized with 1% Lidocaine with epinephrine. Needle position was confirmed within the stomach with aspiration of air and injection of small amount of contrast. A single T tack was deployed for gastropexy. Over an Amplatz guide wire, a 9-French sheath was inserted into the stomach. A snare device was utilized to capture the oral gastric catheter. The snare device was pulled retrograde from the stomach up the esophagus and out the oropharynx. The 20-French pull-through gastrostomy was connected to the snare device and pulled antegrade through the oropharynx down the esophagus into the stomach and then through the percutaneous tract external to the patient. The gastrostomy was assembled externally. Contrast injection confirms position in the stomach. Several spot radiographic images were obtained in various obliquities for documentation. Dressings were placed. The patient tolerated both above procedures well without immediate post procedural complication. FINDINGS: After catheter placement, the tip lies within the superior cavoatrial  junction. The catheter aspirates and flushes normally and is ready for immediate use. After successful fluoroscopic guided placement, the gastrostomy tube is appropriately positioned with internal disc against the ventral aspect of the gastric lumen. IMPRESSION: 1. Successful placement of a right internal jugular approach power injectable Port-A-Cath. The Port a catheter is ready for immediate use. 2. Successful fluoroscopic insertion of a 20-French pull-through gastrostomy tube. The gastrostomy may be used immediately for medication administration and may be utilized in 24 hrs for the initiation of feeds. Electronically Signed   By: Sandi Mariscal M.D.   On: 05/21/2016 12:48   Dg Chest Port 1 View  Result Date: 05/21/2016  CLINICAL DATA:  Shortness of Breath EXAM: PORTABLE CHEST 1 VIEW COMPARISON:  05/11/2016 FINDINGS: Cardiac shadow is within normal limits. Postoperative changes are seen. Right chest wall port is noted. No focal infiltrate or sizable effusion is seen. No acute bony abnormality is noted. IMPRESSION: No acute abnormality seen. Electronically Signed   By: [name] M.D.   On: 05/21/2016 17:17   Dg Abd Portable 1 View  Result Date: 05/21/2016 CLINICAL DATA:  Abdominal distension and recent G-tube placement EXAM: PORTABLE ABDOMEN - 1 VIEW COMPARISON:  None. FINDINGS: Scattered large and small bowel gas is noted. The small bowel loops are mildly prominent likely representing an ileus. Gastrostomy catheter is noted just to the left of the midline. No other focal abnormality is seen. IMPRESSION: Changes most consistent with a small-bowel ileus. Although not mentioned in the body of the report there may be a small amount of free intraperitoneal air likely related to the recent gastrostomy placement. Electronically Signed   By: [name] M.D.   On: 05/21/2016 17:18   Ir Fluoro Guide Port Insertion Right  Result Date: 05/21/2016 INDICATION: History of head neck cancer, in need of durable  intravenous access for chemotherapy administration. In need of enteric access for enteric nutrition supplementation prior to initiation of chemoradiation. EXAM: 1. IMPLANTED PORT A CATH PLACEMENT WITH ULTRASOUND AND FLUOROSCOPIC GUIDANCE 2. FLUOROSCOPIC GUIDED PERCUTANEOUS GASTROSTOMY TUBE PLACEMENT COMPARISON:  CT the chest, abdomen pelvis - 05/12/2018 MEDICATIONS: Glucagon 1 mg IV; Ancef 2 g IV; The antibiotic was administered within an appropriate time interval prior to skin puncture. ANESTHESIA/SEDATION: Moderate (conscious) sedation was employed during this procedure. A total of Versed 6 mg and Fentanyl 100 mcg was administered intravenously. Moderate Sedation Time: 45 minutes. The patient's level of consciousness and vital signs were monitored continuously by radiology nursing throughout the procedure under my direct supervision. CONTRAST:  20 - cc Isovue 300, administered into the stomach lumen. FLUOROSCOPY TIME:  A total of 1 minute, 54 seconds (38.2 mGy) fluoroscopy time was utilized for both procedures. COMPLICATIONS: None immediate. PROCEDURE: The procedures, risks, benefits, and alternatives were explained to the patient. Questions regarding the procedure were encouraged and answered. The patient understands and consents to both procedures. Attention was initially paid towards placement of the [facility] a Catheter. The right neck and chest were prepped with chlorhexidine in a sterile fashion, and a sterile drape was applied covering the operative field. Maximum barrier sterile technique with sterile gowns and gloves were used for the procedure. A timeout was performed prior to the initiation of the procedure. Local anesthesia was provided with 1% lidocaine with epinephrine. After creating a small venotomy incision, a micropuncture kit was utilized to access the internal jugular vein under direct, real-time ultrasound guidance. Ultrasound image documentation was performed. The microwire was kinked to measure  appropriate catheter length. A subcutaneous port pocket was then created along the upper chest wall utilizing a combination of [place] and blunt dissection. The pocket was irrigated with sterile saline. A single lumen thin power injectable port was chosen for placement. The 8 Fr catheter was tunneled from the port pocket site to the venotomy incision. The port was placed in the pocket. The external catheter was trimmed to appropriate length. At the venotomy, an 8 Fr peel-away sheath was placed over a guidewire under fluoroscopic guidance. The catheter was then placed through the sheath and the sheath was removed. Final catheter positioning was confirmed and documented with a fluoroscopic spot radiograph. The port was accessed with a  Huber needle, aspirated and flushed with heparinized saline. The venotomy site was closed with an interrupted 4-0 Vicryl suture. The port pocket incision was closed with interrupted 2-0 Vicryl suture and the skin was opposed with a running subcuticular 4-0 Vicryl suture. Dermabond and Steri-strips were applied to both incisions. Dressings were placed. ____________________________________________ Next, attention was paid towards placement of the gastrostomy tube. The left upper quadrant was sterilely prepped and draped. An oral gastric catheter was inserted into the stomach under fluoroscopy. The existing nasogastric feeding tube was removed. The left costal margin and air / barium opacified transverse colon were identified and avoided. Air was injected into the stomach for insufflation and visualization under fluoroscopy. Under sterile conditions a 17 gauge trocar needle was utilized to access the stomach percutaneously beneath the left subcostal margin after the overlying soft tissues were anesthetized with 1% Lidocaine with epinephrine. Needle position was confirmed within the stomach with aspiration of air and injection of small amount of contrast. A single T tack was deployed for  gastropexy. Over an Amplatz guide wire, a 9-French sheath was inserted into the stomach. A snare device was utilized to capture the oral gastric catheter. The snare device was pulled retrograde from the stomach up the esophagus and out the oropharynx. The 20-French pull-through gastrostomy was connected to the snare device and pulled antegrade through the oropharynx down the esophagus into the stomach and then through the percutaneous tract external to the patient. The gastrostomy was assembled externally. Contrast injection confirms position in the stomach. Several spot radiographic images were obtained in various obliquities for documentation. Dressings were placed. The patient tolerated both above procedures well without immediate post procedural complication. FINDINGS: After catheter placement, the tip lies within the superior cavoatrial junction. The catheter aspirates and flushes normally and is ready for immediate use. After successful fluoroscopic guided placement, the gastrostomy tube is appropriately positioned with internal disc against the ventral aspect of the gastric lumen. IMPRESSION: 1. Successful placement of a right internal jugular approach power injectable Port-A-Cath. The Port a catheter is ready for immediate use. 2. Successful fluoroscopic insertion of a 20-French pull-through gastrostomy tube. The gastrostomy may be used immediately for medication administration and may be utilized in 24 hrs for the initiation of feeds. Electronically Signed   By: Sandi Mariscal M.D.   On: 05/21/2016 12:48    Procedures Procedures (including critical care time)  Medications Ordered in ED Medications  iopamidol (ISOVUE-300) 61 % injection (not administered)  sodium chloride 0.9 % bolus 1,000 mL (0 mLs Intravenous Stopped 05/21/16 1838)  sodium phosphate (FLEET) 7-19 GM/118ML enema 1 enema (1 enema Rectal Given 05/21/16 1737)  iopamidol (ISOVUE-300) 61 % injection 100 mL (100 mLs Intravenous Contrast  Given 05/21/16 1853)     Initial Impression / Assessment and Plan / ED Course  I have reviewed the triage vital signs and the nursing notes.  Pertinent labs & imaging results that were available during my care of the patient were reviewed by me and considered in my medical decision making (see chart for details).     Andres Smith is a 76 y.o. male here with abdominal distention, shortness of breath s/p J tube and port placement this morning. Consider ileus vs pneumothorax vs perforation. Will get labs, CXR, xray abd. If xray showed no free air, may need CT.   8:18 PM Felt better after enema. Had a bowel movement in the ED and passed some gas. CT showed mild free air. I called Dr. Anselm Pancoast, IR  doctor on call for Dr. Pascal Lux. He states that this is expected post G tube placement. Since he is passing gas and felt better, will dc home.   Final Clinical Impressions(s) / ED Diagnoses     Final diagnoses:  None    New Prescriptions New Prescriptions   No medications on file     Drenda Freeze, MD 05/21/16 2019

## 2016-05-21 NOTE — Discharge Instructions (Signed)
Moderate Conscious Sedation, Adult, Care After °These instructions provide you with information about caring for yourself after your procedure. Your health care provider may also give you more specific instructions. Your treatment has been planned according to current medical practices, but problems sometimes occur. Call your health care provider if you have any problems or questions after your procedure. °What can I expect after the procedure? °After your procedure, it is common: °· To feel sleepy for several hours. °· To feel clumsy and have poor balance for several hours. °· To have poor judgment for several hours. °· To vomit if you eat too soon. °Follow these instructions at home: °For at least 24 hours after the procedure:  ° °· Do not: °¨ Participate in activities where you could fall or become injured. °¨ Drive. °¨ Use heavy machinery. °¨ Drink alcohol. °¨ Take sleeping pills or medicines that cause drowsiness. °¨ Make important decisions or sign legal documents. °¨ Take care of children on your own. °· Rest. °Eating and drinking  °· Follow the diet recommended by your health care provider. °· If you vomit: °¨ Drink water, juice, or soup when you can drink without vomiting. °¨ Make sure you have little or no nausea before eating solid foods. °General instructions  °· Have a responsible adult stay with you until you are awake and alert. °· Take over-the-counter and prescription medicines only as told by your health care provider. °· If you smoke, do not smoke without supervision. °· Keep all follow-up visits as told by your health care provider. This is important. °Contact a health care provider if: °· You keep feeling nauseous or you keep vomiting. °· You feel light-headed. °· You develop a rash. °· You have a fever. °Get help right away if: °· You have trouble breathing. °This information is not intended to replace advice given to you by your health care provider. Make sure you discuss any questions you  have with your health care provider. °Document Released: 10/08/2012 Document Revised: 05/23/2015 Document Reviewed: 04/09/2015 °Elsevier Interactive Patient Education © 2017 [employer] ° ° °Implanted Port Insertion, Care After °This sheet gives you information about how to care for yourself after your procedure. Your health care provider may also give you more specific instructions. If you have problems or questions, contact your health care provider. °What can I expect after the procedure? °After your procedure, it is common to have: °· Discomfort at the port insertion site. °· Bruising on the skin over the port. This should improve over 3-4 days. °Follow these instructions at home: °Port care  °· After your port is placed, you will get a manufacturer's information card. The card has information about your port. Keep this card with you at all times. °· Take care of the port as told by your health care provider. Ask your health care provider if you or a family member can get training for taking care of the port at home. A home health care nurse may also take care of the port. °· Make sure to remember what type of port you have. °Incision care  °· Follow instructions from your health care provider about how to take care of your port insertion site. Make sure you: °¨ Wash your hands with soap and water before you change your bandage (dressing). If soap and water are not available, use hand sanitizer. °¨ Change your dressing as told by your health care provider. °¨ Leave stitches (sutures), skin glue, or adhesive strips in place. These skin   closures may need to stay in place for 2 weeks or longer. If adhesive strip edges start to loosen and curl up, you may trim the loose edges. Do not remove adhesive strips completely unless your health care provider tells you to do that.  Check your port insertion site every day for signs of infection. Check for:  More redness, swelling, or pain.  More fluid or  blood.  Warmth.  Pus or a bad smell. General instructions   Do not take baths, swim, or use a hot tub until your health care provider approves.  Do not lift anything that is heavier than 10 lb (4.5 kg) for a week, or as told by your health care provider.  Ask your health care provider when it is okay to:  Return to work or school.  Resume usual physical activities or sports.  Do not drive for 24 hours if you were given a medicine to help you relax (sedative).  Take over-the-counter and prescription medicines only as told by your health care provider.  Wear a medical alert bracelet in case of an emergency. This will tell any health care providers that you have a port.  Keep all follow-up visits as told by your health care provider. This is important. Contact a health care provider if:  You cannot flush your port with saline as directed, or you cannot draw blood from the port.  You have a fever or chills.  You have more redness, swelling, or pain around your port insertion site.  You have more fluid or blood coming from your port insertion site.  Your port insertion site feels warm to the touch.  You have pus or a bad smell coming from the port insertion site. Get help right away if:  You have chest pain or shortness of breath.  You have bleeding from your port that you cannot control. Summary  Take care of the port as told by your health care provider.  Change your dressing as told by your health care provider.  Keep all follow-up visits as told by your health care provider. This information is not intended to replace advice given to you by your health care provider. Make sure you discuss any questions you have with your health care provider. Document Released: 10/08/2012 Document Revised: 11/09/2015 Document Reviewed: 11/09/2015 Elsevier Interactive Patient Education  2017 [employer].   Gastrostomy Tube Home Guide, Adult A gastrostomy tube is a tube that is  surgically placed into the stomach. It is also called a G-tube. G-tubes are used when a person is unable to eat and drink enough on their own to stay healthy. The tube is inserted into the stomach through a small cut (incision) in the skin. This tube is used for:  Feeding.  Giving medication. Gastrostomy tube care  Wash your hands with soap and water.  Remove the old dressing (if any). Some styles of G-tubes may need a dressing inserted between the skin and the G-tube. Other types of G-tubes do not require a dressing. Ask your health care provider if a dressing is needed.  Check the area where the tube enters the skin (insertion site) for redness, swelling, or pus-like (purulent) drainage. A small amount of clear or tan liquid drainage is normal. Check to make sure scar tissue (skin) is not growing around the insertion site. This could have a raised, bumpy appearance.  A cotton swab can be used to clean the skin around the tube:  When the G-tube is first put  in, a normal saline solution or water can be used to clean the skin.  Mild soap and warm water can be used when the skin around the G-tube site has healed.  Roll the cotton swab around the G-tube insertion site to remove any drainage or crusting at the insertion site. Stomach residuals Feeding tube residuals are the amount of liquids that are in the stomach at any given time. Residuals may be checked before giving feedings, medications, or as instructed by your health care provider.  Ask your health care provider if there are instances when you would not start tube feedings depending on the amount or type of contents withdrawn from the stomach.  Check residuals by attaching a syringe to the G-tube and pulling back on the syringe plunger. Note the amount, and return the residual back into the stomach. Flushing the G-tube  The G-tube should be periodically flushed with clean warm water to keep it from clogging.  Flush the G-tube  after feedings or medications. Draw up 30 mL of warm water in a syringe. Connect the syringe to the G-tube and slowly push the water into the tube.  Do not push feedings, medications, or flushes rapidly. Flush the G-tube gently and slowly.  Only use syringes made for G-tubes to flush medications or feedings.  Your health care provider may want the G-tube flushed more often or with more water. If this is the case, follow your health care provider's instructions. Feedings Your health care provider will determine whether feedings are given as a bolus (a certain amount given at one time and at scheduled times) or whether feedings will be given continuously on a feeding pump.  Formulas should be given at room temperature.  If feedings are continuous, no more than 4 hours worth of feedings should be placed in the feeding bag. This helps prevent spoilage or accidental excess infusion.  Cover and place unused formula in the refrigerator.  If feedings are continuous, stop the feedings when medications or flushes are given. Be sure to restart the feedings.  Feeding bags and syringes should be replaced as instructed by your health care provider. Giving medication  In general, it is best if all medications are in a liquid form for G-tube administration. Liquid medications are less likely to clog the G-tube.  Mix the liquid medication with 30 mL (or amount recommended by your health care provider) of warm water.  Draw up the medication into the syringe.  Attach the syringe to the G-tube and slowly push the mixture into the G-tube.  After giving the medication, draw up 30 mL of warm water in the syringe and slowly flush the G-tube.  For pills or capsules, check with your health care provider first before crushing medications. Some pills are not effective if they are crushed. Some capsules are sustained-release medications.  If appropriate, crush the pill or capsule and mix with 30 mL of warm  water. Using the syringe, slowly push the medication through the tube, then flush the tube with another 30 mL of tap water. G-tube problems G-tube was pulled out.  Cause: May have been pulled out accidentally.  Solutions: Cover the opening with clean dressing and tape. Call your health care provider right away. The G-tube should be put in as soon as possible (within 4 hours) so the G-tube opening (tract) does not close. The G-tube needs to be put in at a health care setting. An X-ray needs to be done to confirm placement before the G-tube can  be used again. Redness, irritation, soreness, or foul odor around the gastrostomy site.  Cause: May be caused by leakage or infection.  Solutions: Call your health care provider right away. Large amount of leakage of fluid or mucus-like liquid present (a large amount means it soaks clothing).  Cause: Many reasons could cause the G-tube to leak.  Solutions: Call your health care provider to discuss the amount of leakage. Skin or scar tissue appears to be growing where tube enters skin.  Cause: Tissue growth may develop around the insertion site if the G-tube is moved or pulled on excessively.  Solutions: Secure tube with tape so that excess movement does not occur. Call your health care provider. G-tube is clogged.  Cause: Thick formula or medication.  Solutions: Try to slowly push warm water into the tube with a large syringe. Never try to push any object into the tube to unclog it. Do not force fluid into the G-tube. If you are unable to unclog the tube, call your health care provider right away. Tips  Head of bed (HOB) position refers to the upright position of a person's upper body.  When giving medications or a feeding bolus, keep the [facility] up as told by your health care provider. Do this during the feeding and for 1 hour after the feeding or medication administration.  If continuous feedings are being given, it is best to keep the [facility] up as  told by your health care provider. When ADLs (activities of daily living) are performed and the [facility] needs to be flat, be sure to turn the feeding pump off. Restart the feeding pump when the [facility] is returned to the recommended height.  Do not pull or put tension on the tube.  To prevent fluid backflow, kink the G-tube before removing the cap or disconnecting a syringe.  Check the G-tube length every day. Measure from the insertion site to the end of the G-tube. If the length is longer than previous measurements, the tube may be coming out. Call your health care provider if you notice increasing G-tube length.  Oral care, such as brushing teeth, must be continued.  You may need to remove excess air (vent) from the G-tube. Your health care provider will tell you if this is needed.  Always call your health care provider if you have questions or problems with the G-tube. Get help right away if:  You have severe abdominal pain, tenderness, or abdominal bloating (distension).  You have nausea or vomiting.  You are constipated or have problems moving your bowels.  The G-tube insertion site is red, swollen, has a foul smell, or has yellow or brown drainage.  You have difficulty breathing or shortness of breath.  You have a fever.  You have a large amount of feeding tube residuals.  The G-tube is clogged and cannot be flushed. This information is not intended to replace advice given to you by your health care provider. Make sure you discuss any questions you have with your health care provider. Document Released: 02/26/2001 Document Revised: 05/26/2015 Document Reviewed: 08/25/2012 Elsevier Interactive Patient Education  2017 [employer] of a Feeding Tube Feeding tubes are often given to those who have trouble swallowing or cannot take food or medicine. A feeding tube can:  Go into the nose and down to the stomach.  Go through the skin in the belly (abdomen) and into the  stomach or small bowel. Supplies needed to care for the tube site:  Clean gloves.  Clean wash cloth, gauze pads, or soft paper towel.  Cotton swabs.  Skin barrier ointment or cream.  Soap and water.  Precut foam pads or gauze (that go around the tube).  Tube tape. Tube site care 1. Have all supplies ready. 2. Wash hands well. 3. Put on clean gloves. 4. Remove dirty foam pads or gauze near the tube site, if present. 5. Check the skin around the tube site for redness, rash, puffiness (swelling), leaking fluid, or extra tissue growth. Call your doctor if you see any of these. 6. Wet the gauze and cotton swabs with water and soap. 7. Wipe the area closest to the tube with cotton swabs. Wipe the surrounding skin with moistened gauze. Rinse with water. 8. Dry the skin and tube site with a dry gauze pad or soft paper towel. Do not use antibiotic ointments at the tube site. 9. If the skin is red, apply petroleum jelly in a circular motion, using a cotton swab. Your doctor may suggest a different cream or ointment. Use what the doctor suggests. 10. Apply a new pre-cut foam pad or gauze around the tube. Tape the edges down. Foam pads or gauze may be left off if there is no fluid at the tube site. 11. Use tape or a device that will attach your feeding tube to your skin or do as directed. Rotate where you tape the tube. 12. Sit the person up. 13. Throw away used supplies. 14. Remove gloves. 15. Wash hands. Supplies needed to flush a feeding tube:  Clean gloves.  60 mL syringe (that connects to feeding tube).  Towel.  Water. Flushing a feeding tube 1. Have all supplies ready. 2. Wash hands well. 3. Put on clean gloves. 4. Pull 30 mL of water into the syringe. 5. Bend (kink) the feeding tube while disconnecting it from the feeding-bag tubing or while removing the plug at the end of the tube. 6. Insert the tip of the syringe into the end of the feeding tube. Stop bending the tube.  Slowly inject the water. 7. If you cannot inject the water, the person with the feeding tube should lay on their left side.  Do not use a syringe smaller than 60 mL to flush the tubing.  Do not inject the water with force. 8. After injecting the water, remove the syringe. 9. Always flush the tube before giving the first medicine, between medicines, and after the final medicine before starting a feeding.  Do not mix medicines with liquid food (formula) before giving medicines.  Do not mix medicines with other medicines before giving medicines.  Completely flush medicines through the tube so they do not mix with the liquid food. 10. Throw away used supplies. 11. Remove gloves. 12. Wash hands. This information is not intended to replace advice given to you by your health care provider. Make sure you discuss any questions you have with your health care provider. Document Released: 09/12/2011 Document Revised: 05/26/2015 Document Reviewed: 08/02/2011 Elsevier Interactive Patient Education  2017 [employer].

## 2016-05-21 NOTE — Consult Note (Signed)
Chief Complaint: Patient was seen in consultation today for Port-A-Cath as well as percutaneous gastrostomy tube placements  Referring Physician(s): Gorsuch,Ni  Supervising Physician: Sandi Mariscal  Patient Status: [facility] - Out-pt  History of Present Illness: Andres Smith is a 76 y.o. 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  Past Medical History:  Diagnosis Date  . Ankylosing spondylitis ([place])   . Blood transfusion    hx of transfusion without reaction  . Bradycardia   . Cancer ([place]) 03/2016   oral  . Cholelithiasis   . Coronary artery disease    a. s/p CABG x 6 in 1997 (LIMA->LAD, VG->RI ->OM1->OM2, VG->PDA->PLV // b. 05/2011 Cath:  patent grafs, native prox rca and d2 dzs  ->med rx. // c. Myoview 1/18: not gated, large inferolateral scar, no ischemia; Intermediate Risk (IL scar old - on prior studies >> med rx)  . Crohn's colitis ([place]) 12/01/1997  . Diabetes mellitus   . Dysphagia   . Fatty liver   . GERD (gastroesophageal reflux disease)   . Heart block   . Hiatal hernia 12/03/2008  . History of skin cancer   . Hx of echocardiogram    Echo 5/14:  Mild LVH, EF 55-60%, NL diast fxn, mild LAE   . Hyperlipidemia   . Hypertension   . Kidney stones   . Neck pain   . Pneumonia    hx of PNA  . 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 cancer removal  2014   removed from left outter leg  . COLONOSCOPY  2012  . CORONARY ARTERY BYPASS GRAFT  04/19/1995   x7  . EXCISION ORAL TUMOR Left    left jaw and lymph node  . HERNIA REPAIR  1989  . LEFT HEART CATHETERIZATION WITH CORONARY/GRAFT ANGIOGRAM N/A 05/24/2011   Procedure: LEFT HEART CATHETERIZATION WITH Beatrix Fetters;  Surgeon: Larey Dresser, MD;  [facility] C[facility] LAB;  Service:  Cardiovascular;  Laterality: N/A;  . MOHS SURGERY     X 2 off chin and nose  . TONSILLECTOMY AND ADENOIDECTOMY  1944  . TRIGGER FINGER RELEASE    . UMBILICAL HERNIA REPAIR      Allergies: Patient has no known allergies.  Medications: Prior to Admission medications   Medication Sig Start Date End Date Taking? Authorizing Provider  aspirin 81 MG tablet Take 81 mg by mouth daily.      [provider]  atorvastatin (LIPITOR) 80 MG tablet TAKE 1 TABLET AT 6PM. 04/30/16   Sherren Mocha, MD  carvedilol (COREG) 3.125 MG tablet Take 1 tablet (3.125 mg total) by mouth 2 (two) times daily with a meal. 03/26/16   Weaver, Scott T, PA-C  cyanocobalamin (,VITAMIN B-12,) 1000 MCG/ML injection INJECT 1ML ONCE MONTHLY. 03/21/15   Janith Lima, MD  isosorbide mononitrate (IMDUR) 60 MG 24 hr tablet Take 1.5 tablets (90 mg total) by mouth daily. 12/13/15 03/12/16  Richardson Dopp T, PA-C  losartan (COZAAR) 100 MG tablet Take 1 tablet (100 mg total) by mouth daily. 02/29/16   Sherren Mocha, MD  magnesium oxide (MAG-OX) 400 MG tablet Take 400 mg by mouth. 04/13/16   [provider]  mercaptopurine (PURINETHOL) 50 MG tablet TAKE ONE TABLET ONCE A DAY ON AN EMPTY STOMACH 1 HR BEFORE OR 2HRS AFTER A MEAL 04/02/16  Jerene Bears, MD  mesalamine (LIALDA) 1.2 g EC tablet TAKE 2 TABLETS EVERY DAY WITH BREAKFAST 04/03/16   Pyrtle, Lajuan Lines, MD  metFORMIN (GLUCOPHAGE) 500 MG tablet Take 1 tablet (500 mg total) by mouth 2 (two) times daily with a meal. Must Keep 05/16/15 appt for future refills 04/19/15   Janith Lima, MD  NITROSTAT 0.4 MG SL tablet ONE TAB UNDER TONGUE-MAY REPEAT FOR 2 DOSES EVERY 5 MIN X3TIMES-IF NORESPONSE CALL 911 Patient not taking: Reported on 05/14/2016 08/08/15   Sherren Mocha, MD  SYRINGE-NEEDLE, DISP, 3 ML (B-D 3CC LUER-LOK SYR 25GX5/8") 25G X 5/8" 3 ML MISC Use to inject b12 montly 07/21/15   Janith Lima, MD     Family History  Problem Relation Age of Onset  . Heart disease  Father   . Heart disease Mother   . Arthritis Mother   . Heart disease Unknown   . Crohn's disease Unknown   . Heart disease Sister   . Cancer Paternal Grandmother        colon  . Colon cancer Neg Hx     Social History   Social History  . Marital status: Married    Spouse name: N/A  . Number of children: 2  . Years of education: N/A   Occupational History  . semi retired Sports coach Employed   Social History Main Topics  . Smoking status: Former Smoker    Quit date: 01/02/1983  . Smokeless tobacco: Former Systems developer    Types: Chew    Quit date: 01/01/2010  . Alcohol use Yes     Comment: tottie every night  . Drug use: No  . Sexual activity: Not Currently   Other Topics Concern  . Not on file   Social History Narrative   HSG, [place] - Civil engineering/education in industrial, [place] from [place]. Married '64. 1 son '74, 1 dtr - '69; 1 grandchild, 1 on the way. 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 Lives with wife. ACP -               Review of Systems Currently denies fever, headache, chest pain, dyspnea, cough, abdominal pain, nausea, vomiting or abnormal bleeding. He does have some occasional back and neck discomfort, weight loss, difficulty eating.  Vital Signs: BP (!) 106/59 (BP Location: Right Arm)   Pulse 79   Temp 97.6 F (36.4 C) (Oral)   Resp 16   SpO2 99%   Physical Exam Awake, alert. Chest clear to auscultation bilaterally. Heart with nl S1/S2, occasional ectopy noted. Abdomen soft, positive bowel sounds, nontender. Lower extremities with no edema.  Mallampati Score:     Imaging: Ct Soft Tissue Neck W Contrast  Result Date: 05/11/2016 CLINICAL DATA:  Carcinoma of the left buccal mucosa diagnosed in December of 2017 EXAM: CT NECK WITH CONTRAST TECHNIQUE:  Multidetector CT imaging of the neck was performed using the standard protocol following the bolus administration of intravenous contrast. CONTRAST:  122mL ISOVUE-300 IOPAMIDOL (ISOVUE-300) INJECTION 61% COMPARISON:  None. FINDINGS: Pharynx and larynx: Surgical clips in the region of the left buccal mucosa. Mild scarring in that region measuring about 8 mm. No definite actual mass lesion. No other focal mucosal finding. Larynx appears normal. Salivary glands: Left parotid slightly atrophic. Right parotid normal. Right submandibular normal. Left submandibular previously resected. Thyroid: Multinodular  goiter with largest nodule inferior on the left measuring 3.1 cm. Lymph nodes: No enlarged or low-density nodes on either side of the neck. Calcified 4 mm supraclavicular node on the left. Surgical clips related to previous level 2 and level 3 node resections. Vascular: Bilateral carotid calcification. Limited intracranial: Normal except for vascular calcification. Visualized orbits: Normal Mastoids and visualized paranasal sinuses: Clear/normal Skeleton: Ankylosis of the spine consistent with ankylosing spondylitis. Ankylosis of the occiput put and C1. 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 Upper chest: See results of chest CT. Other: None IMPRESSION: Surgical clips in the region of the left buccal mucosa, presumably at the previous primary site. Mild scarring in that region measuring about 8 mm. Cannot assess for residual or recurrent disease in that location, but this may simply be scarring. Previous left submandibular resection and left neck node dissection. No abnormal nodes presently. Multinodular goiter. Ankylosing spondylitis.  C1-2 remains a mobile segment. Electronically Signed   By: Nelson Chimes M.D.   On: 05/11/2016 09:17   Ct Chest W Contrast  Result Date: 05/11/2016 CLINICAL DATA:  Head and neck cancer. EXAM: CT CHEST, ABDOMEN, WITH CONTRAST TECHNIQUE: Multidetector CT  imaging of the chest, abdomen and pelvis was performed following the standard protocol during bolus administration of intravenous contrast. CONTRAST:  142mL ISOVUE-300 IOPAMIDOL (ISOVUE-300) INJECTION 61% COMPARISON:  None. FINDINGS: CT CHEST FINDINGS Cardiovascular: The heart size is normal. No pericardial effusion. Patient is status post CABG. Atherosclerotic calcification is noted in the wall of the thoracic aorta. Mediastinum/Nodes: No mediastinal lymphadenopathy. There is no hilar lymphadenopathy. The esophagus has normal imaging features. 2.8 cm left thyroid nodule associated with small right thyroid nodules. There is no axillary lymphadenopathy. Lungs/Pleura: 4 mm left lower lobe pulmonary nodule seen image 98 series 7. No focal airspace consolidation. No pulmonary edema or pleural effusion. Musculoskeletal: Bone windows reveal no worrisome lytic or sclerotic osseous lesions. CT ABDOMEN FINDINGS Hepatobiliary: No focal abnormality within the liver parenchyma. 14 mm calcified gallstone. No intrahepatic or extrahepatic biliary dilation. Pancreas: No focal mass lesion. No dilatation of the main duct. No intraparenchymal cyst. No peripancreatic edema. Spleen: No splenomegaly. No focal mass lesion. Adrenals/Urinary Tract: No adrenal nodule or mass. 5.6 cm exophytic cyst right kidney. 4.2 cm left renal cyst associated with another smaller 2.7 cm cyst lower pole left kidney. Small central sinus cysts noted in the kidneys bilaterally Stomach/Bowel: Stomach is nondistended. No gastric wall thickening. No evidence of outlet obstruction. Duodenum is normally positioned as is the ligament of Treitz. Visualize small bowel loops are unremarkable. Abdominal segments of the colon are unremarkable. Vascular/Lymphatic: There is abdominal aortic atherosclerosis without aneurysm. There is no gastrohepatic or hepatoduodenal ligament lymphadenopathy. No intraperitoneal or retroperitoneal lymphadenopathy. Other: No intraperitoneal  free fluid. Musculoskeletal: Bone windows reveal no worrisome lytic or sclerotic osseous lesions. Changes of ankylosing spondylitis noted. IMPRESSION: 1. 4 mm left lower lobe pulmonary nodule. Attention on follow-up imaging recommended as metastatic disease not excluded. 2. 2.8 cm left thyroid nodule. Thyroid ultrasound recommended to further evaluate. 3. Bilateral renal cysts. 4. Coronary artery and thoracoabdominal aortic atherosclerosis. 5. Ankylosing spondylitis. Electronically Signed   By: Misty Stanley M.D.   On: 05/11/2016 09:32   Ct Abdomen W Contrast  Result Date: 05/11/2016 CLINICAL DATA:  Head and neck cancer. EXAM: CT CHEST, ABDOMEN, WITH CONTRAST TECHNIQUE: Multidetector CT imaging of the chest, abdomen and pelvis was performed following the standard protocol during bolus administration of intravenous contrast. CONTRAST:  133mL ISOVUE-300 IOPAMIDOL (ISOVUE-300) INJECTION  61% COMPARISON:  None. FINDINGS: CT CHEST FINDINGS Cardiovascular: The heart size is normal. No pericardial effusion. Patient is status post CABG. Atherosclerotic calcification is noted in the wall of the thoracic aorta. Mediastinum/Nodes: No mediastinal lymphadenopathy. There is no hilar lymphadenopathy. The esophagus has normal imaging features. 2.8 cm left thyroid nodule associated with small right thyroid nodules. There is no axillary lymphadenopathy. Lungs/Pleura: 4 mm left lower lobe pulmonary nodule seen image 98 series 7. No focal airspace consolidation. No pulmonary edema or pleural effusion. Musculoskeletal: Bone windows reveal no worrisome lytic or sclerotic osseous lesions. CT ABDOMEN FINDINGS Hepatobiliary: No focal abnormality within the liver parenchyma. 14 mm calcified gallstone. No intrahepatic or extrahepatic biliary dilation. Pancreas: No focal mass lesion. No dilatation of the main duct. No intraparenchymal cyst. No peripancreatic edema. Spleen: No splenomegaly. No focal mass lesion. Adrenals/Urinary Tract: No  adrenal nodule or mass. 5.6 cm exophytic cyst right kidney. 4.2 cm left renal cyst associated with another smaller 2.7 cm cyst lower pole left kidney. Small central sinus cysts noted in the kidneys bilaterally Stomach/Bowel: Stomach is nondistended. No gastric wall thickening. No evidence of outlet obstruction. Duodenum is normally positioned as is the ligament of Treitz. Visualize small bowel loops are unremarkable. Abdominal segments of the colon are unremarkable. Vascular/Lymphatic: There is abdominal aortic atherosclerosis without aneurysm. There is no gastrohepatic or hepatoduodenal ligament lymphadenopathy. No intraperitoneal or retroperitoneal lymphadenopathy. Other: No intraperitoneal free fluid. Musculoskeletal: Bone windows reveal no worrisome lytic or sclerotic osseous lesions. Changes of ankylosing spondylitis noted. IMPRESSION: 1. 4 mm left lower lobe pulmonary nodule. Attention on follow-up imaging recommended as metastatic disease not excluded. 2. 2.8 cm left thyroid nodule. Thyroid ultrasound recommended to further evaluate. 3. Bilateral renal cysts. 4. Coronary artery and thoracoabdominal aortic atherosclerosis. 5. Ankylosing spondylitis. Electronically Signed   By: Misty Stanley M.D.   On: 05/11/2016 09:32    Labs:  CBC:  Recent Labs  10/03/15 0931 05/21/16 0818  WBC 4.1 3.3*  HGB 12.8* 11.2*  HCT 37.3* 32.7*  PLT 164.0 162    COAGS: No results for input(s): INR, APTT in the last 8760 hours.  BMP:  Recent Labs  10/03/15 0931 10/17/15 1139  NA 140 138  K 4.6 4.3  CL 104 103  CO2 32 29  GLUCOSE 129* 174*  BUN 12 14  CALCIUM 8.8 9.0  CREATININE 0.89 0.94    LIVER FUNCTION TESTS:  Recent Labs  10/03/15 0931 10/17/15 1139  BILITOT 1.4* 1.0  AST 19 13  ALT 14 10  ALKPHOS 44 41  PROT 6.6 6.7  ALBUMIN 3.9 4.2    TUMOR MARKERS: No results for input(s): AFPTM, CEA, CA199, CHROMGRNA in the last 8760 hours.  Assessment and Plan: 76 y.o. 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     Thank you for this interesting consult.  I greatly enjoyed meeting JASKARN SCHWEER and look forward to participating in their care.  A copy of this report was sent to the requesting provider on this date.  Electronically Signed: D. Rowe Robert, PA-C 05/21/2016, 8:37 AM   I spent a total of 25 minutes  in face to face  in clinical consultation, greater than 50% of which was counseling/coordinating care for Port-A-Cath and percutaneous gastrostomy tube placements

## 2016-05-21 NOTE — Procedures (Signed)
Pre Procedure Dx: Head and neck cancer Post Procedural Dx: Same  Successful placement of right IJ approach port-a-cath with tip at the superior caval atrial junction. The catheter is ready for immediate use.  Successful fluoroscopic guided insertion of gastrostomy tube.   The gastrostomy tube may be used immediately for medications.   Tube feeds may be initiated in 24 hours as per the primary team.    EBL: Minimal  Complications: None immediate  Ronny Bacon, MD Pager #: [phone]

## 2016-05-21 NOTE — Discharge Instructions (Signed)
Gastrostomy Tube Home Guide, Adult A gastrostomy tube is a tube that is surgically placed into the stomach. It is also called a G-tube. G-tubes are used when a person is unable to eat and drink enough on their own to stay healthy. The tube is inserted into the stomach through a small cut (incision) in the skin. This tube is used for:  Feeding.  Giving medication. Gastrostomy tube care  Wash your hands with soap and water.  Remove the old dressing (if any). Some styles of G-tubes may need a dressing inserted between the skin and the G-tube. Other types of G-tubes do not require a dressing. Ask your health care provider if a dressing is needed.  Check the area where the tube enters the skin (insertion site) for redness, swelling, or pus-like (purulent) drainage. A small amount of clear or tan liquid drainage is normal. Check to make sure scar tissue (skin) is not growing around the insertion site. This could have a raised, bumpy appearance.  A cotton swab can be used to clean the skin around the tube:  When the G-tube is first put in, a normal saline solution or water can be used to clean the skin.  Mild soap and warm water can be used when the skin around the G-tube site has healed.  Roll the cotton swab around the G-tube insertion site to remove any drainage or crusting at the insertion site. Stomach residuals Feeding tube residuals are the amount of liquids that are in the stomach at any given time. Residuals may be checked before giving feedings, medications, or as instructed by your health care provider.  Ask your health care provider if there are instances when you would not start tube feedings depending on the amount or type of contents withdrawn from the stomach.  Check residuals by attaching a syringe to the G-tube and pulling back on the syringe plunger. Note the amount, and return the residual back into the stomach. Flushing the G-tube  The G-tube should be periodically  flushed with clean warm water to keep it from clogging.  Flush the G-tube after feedings or medications. Draw up 30 mL of warm water in a syringe. Connect the syringe to the G-tube and slowly push the water into the tube.  Do not push feedings, medications, or flushes rapidly. Flush the G-tube gently and slowly.  Only use syringes made for G-tubes to flush medications or feedings.  Your health care provider may want the G-tube flushed more often or with more water. If this is the case, follow your health care provider's instructions. Feedings Your health care provider will determine whether feedings are given as a bolus (a certain amount given at one time and at scheduled times) or whether feedings will be given continuously on a feeding pump.  Formulas should be given at room temperature.  If feedings are continuous, no more than 4 hours worth of feedings should be placed in the feeding bag. This helps prevent spoilage or accidental excess infusion.  Cover and place unused formula in the refrigerator.  If feedings are continuous, stop the feedings when medications or flushes are given. Be sure to restart the feedings.  Feeding bags and syringes should be replaced as instructed by your health care provider. Giving medication  In general, it is best if all medications are in a liquid form for G-tube administration. Liquid medications are less likely to clog the G-tube.  Mix the liquid medication with 30 mL (or amount recommended by your health  care provider) of warm water.  Draw up the medication into the syringe.  Attach the syringe to the G-tube and slowly push the mixture into the G-tube.  After giving the medication, draw up 30 mL of warm water in the syringe and slowly flush the G-tube.  For pills or capsules, check with your health care provider first before crushing medications. Some pills are not effective if they are crushed. Some capsules are sustained-release  medications.  If appropriate, crush the pill or capsule and mix with 30 mL of warm water. Using the syringe, slowly push the medication through the tube, then flush the tube with another 30 mL of tap water. G-tube problems G-tube was pulled out.  Cause: May have been pulled out accidentally.  Solutions: Cover the opening with clean dressing and tape. Call your health care provider right away. The G-tube should be put in as soon as possible (within 4 hours) so the G-tube opening (tract) does not close. The G-tube needs to be put in at a health care setting. An X-ray needs to be done to confirm placement before the G-tube can be used again. Redness, irritation, soreness, or foul odor around the gastrostomy site.  Cause: May be caused by leakage or infection.  Solutions: Call your health care provider right away. Large amount of leakage of fluid or mucus-like liquid present (a large amount means it soaks clothing).  Cause: Many reasons could cause the G-tube to leak.  Solutions: Call your health care provider to discuss the amount of leakage. Skin or scar tissue appears to be growing where tube enters skin.  Cause: Tissue growth may develop around the insertion site if the G-tube is moved or pulled on excessively.  Solutions: Secure tube with tape so that excess movement does not occur. Call your health care provider. G-tube is clogged.  Cause: Thick formula or medication.  Solutions: Try to slowly push warm water into the tube with a large syringe. Never try to push any object into the tube to unclog it. Do not force fluid into the G-tube. If you are unable to unclog the tube, call your health care provider right away. Tips  Head of bed (HOB) position refers to the upright position of a person's upper body.  When giving medications or a feeding bolus, keep the [facility] up as told by your health care provider. Do this during the feeding and for 1 hour after the feeding or medication  administration.  If continuous feedings are being given, it is best to keep the [facility] up as told by your health care provider. When ADLs (activities of daily living) are performed and the [facility] needs to be flat, be sure to turn the feeding pump off. Restart the feeding pump when the [facility] is returned to the recommended height.  Do not pull or put tension on the tube.  To prevent fluid backflow, kink the G-tube before removing the cap or disconnecting a syringe.  Check the G-tube length every day. Measure from the insertion site to the end of the G-tube. If the length is longer than previous measurements, the tube may be coming out. Call your health care provider if you notice increasing G-tube length.  Oral care, such as brushing teeth, must be continued.  You may need to remove excess air (vent) from the G-tube. Your health care provider will tell you if this is needed.  Always call your health care provider if you have questions or problems with the G-tube. Get help  right away if:  You have severe abdominal pain, tenderness, or abdominal bloating (distension).  You have nausea or vomiting.  You are constipated or have problems moving your bowels.  The G-tube insertion site is red, swollen, has a foul smell, or has yellow or brown drainage.  You have difficulty breathing or shortness of breath.  You have a fever.  You have a large amount of feeding tube residuals.  The G-tube is clogged and cannot be flushed. This information is not intended to replace advice given to you by your health care provider. Make sure you discuss any questions you have with your health care provider. Document Released: 02/26/2001 Document Revised: 05/26/2015 Document Reviewed: 08/25/2012 Elsevier Interactive Patient Education  2017 [place] An implanted port is a type of central line that is placed under the skin. Central lines are used to provide IV access when treatment or  nutrition needs to be given through a persons veins. Implanted ports are used for long-term IV access. An implanted port may be placed because:  You need IV medicine that would be irritating to the small veins in your hands or arms.  You need long-term IV medicines, such as antibiotics.  You need IV nutrition for a long period.  You need frequent blood draws for lab tests.  You need dialysis. Implanted ports are usually placed in the chest area, but they can also be placed in the upper arm, the abdomen, or the leg. An implanted port has two main parts:  Reservoir. The reservoir is round and will appear as a small, raised area under your skin. The reservoir is the part where a needle is inserted to give medicines or draw blood.  Catheter. The catheter is a thin, flexible tube that extends from the reservoir. The catheter is placed into a large vein. Medicine that is inserted into the reservoir goes into the catheter and then into the vein. How will I care for my incision site? Do not get the incision site wet. Bathe or shower as directed by your health care provider. How is my port accessed? Special steps must be taken to access the port:  Before the port is accessed, a numbing cream can be placed on the skin. This helps numb the skin over the port site.  Your health care provider uses a sterile technique to access the port.  Your health care provider must put on a mask and sterile gloves.  The skin over your port is cleaned carefully with an antiseptic and allowed to dry.  The port is gently pinched between sterile gloves, and a needle is inserted into the port.  Only "non-coring" port needles should be used to access the port. Once the port is accessed, a blood return should be checked. This helps ensure that the port is in the vein and is not clogged.  If your port needs to remain accessed for a constant infusion, a clear (transparent) bandage will be placed over the needle site.  The bandage and needle will need to be changed every week, or as directed by your health care provider.  Keep the bandage covering the needle clean and dry. Do not get it wet. Follow your health care providers instructions on how to take a shower or bath while the port is accessed.  If your port does not need to stay accessed, no bandage is needed over the port. What is flushing? Flushing helps keep the port from getting clogged. Follow your health  care providers instructions on how and when to flush the port. Ports are usually flushed with saline solution or a medicine called heparin. The need for flushing will depend on how the port is used.  If the port is used for intermittent medicines or blood draws, the port will need to be flushed:  After medicines have been given.  After blood has been drawn.  As part of routine maintenance.  If a constant infusion is running, the port may not need to be flushed. How long will my port stay implanted? The port can stay in for as long as your health care provider thinks it is needed. When it is time for the port to come out, surgery will be done to remove it. The procedure is similar to the one performed when the port was put in. When should I seek immediate medical care? When you have an implanted port, you should seek immediate medical care if:  You notice a bad smell coming from the incision site.  You have swelling, redness, or drainage at the incision site.  You have more swelling or pain at the port site or the surrounding area.  You have a fever that is not controlled with medicine. This information is not intended to replace advice given to you by your health care provider. Make sure you discuss any questions you have with your health care provider. Document Released: 12/18/2004 Document Revised: 05/26/2015 Document Reviewed: 08/25/2012 Elsevier Interactive Patient Education  2017 [place], Adult A central line is  a soft, flexible tube (catheter) that can be used to collect blood for testing or to give medicine or nutrition through a vein. The tip of the central line ends in a large vein just above the heart called the vena cava. A central line may be placed because:  You need to get medicines or fluids through an IV tube for a long period of time.  You need nutrition but cannot eat or absorb nutrients.  The veins in your hands or arms are hard to access.  You need to have blood taken often for blood tests.  You need a blood transfusion  You need chemotherapy or dialysis. There are many types of central lines:  Peripherally inserted central catheter (PICC) line. This type is used for intermediate access to long-term access of one week or more. It can be used to draw blood and give fluids or medicines. A PICC looks like an IV tube, but it goes up the arm to the heart. It is usually inserted in the upper arm and taped in place on the arm.  Tunneled central line. This type is used for long-term therapy and dialysis. It is placed in a large vein in the neck, chest, or groin. A tunneled central line is inserted through a small incision made over the vein and is advanced into the heart. It is tunneled beneath the skin and brought out through a second incision.  Non-tunneled central line. This type is used for short-term access, usually of a maximum of 7 days. It is often used in the emergency department. A non-tunneled central line is inserted in the neck, chest, or groin.  Implanted port. This type is used for long-term therapy. It can stay in place longer than other types of central lines. An implanted port is normally inserted in the upper chest but can also be placed in the upper arm or in the abdomen. It is inserted and removed with surgery, and it is  accessed using a special needle. The type of central line that you receive depends on how long you will need it, your medical condition, and the condition  of your veins. What are the risks? Using any type of central line has risks that you should be aware of, including:  Infection.  A blood clot that blocks the central line or forms in the vein and travels to the heart.  Bleeding from the place where the central line was put in.  Developing a hole or crack within the central line. If this happens, the central line will need to be replaced.  Developing an abnormal heart rhythm (arrhythmia). This is rare.  Central line failure. Follow these instructions at home: Flushing and cleaning the central line   Follow instructions from the health care provider about flushing and cleaning the central line.  Wear a mask when flushing or cleaning the central line.  Before you flush or clean the central line:  Wash your hands with soap and water.  Clean the central line hub with rubbing alcohol. Insertion site care   Keep the insertion site of your central line clean and dry at all times.  Check your incision or central line site every day for signs of infection. Check for:  More redness, swelling, or pain.  More fluid or blood.  Warmth.  Pus or a bad smell. General instructions   Follow instructions from your health care provider for the type of device that you have.  If the central line accidentally gets pulled on, make sure:  The bandage (dressing) is okay.  There is no bleeding.  The line has not been pulled out.  Return to your normal activities as told by your health care provider. Ask your health care provider what activities are safe for you. You may be restricted from lifting or making repetitive arm movements on the side with the catheter.  Do not swim or bathe unless your health care provider approves.  Keep your dressing dry. Your health care provider can instruct you about how to keep your specific type of dressing from getting wet.  Keep all follow-up visits as told by your health care provider. This is  important. Contact a health care provider if:  You have more redness, swelling, or pain around your incision.  You have more fluid or blood coming from your incision.  Your incision feels warm to the touch.  You have pus or a bad smell coming from your incision. Get help right away if:  You have:  Chills.  A fever.  Shortness of breath.  Trouble breathing.  Chest pain.  Swelling in your neck, face, chest, or arm on the side of your central line.  You are coughing.  You feel your heart beating rapidly or skipping beats.  You feel dizzy or you faint.  Your incision or central line site has red streaks spreading away from the area.  Your incision or central line site is bleeding and does not stop.  Your central line is difficult to flush or will not flush.  You do not get a blood return from the central line.  Your central line gets loose or comes out.  Your central line gets damaged.  Your catheter leaks when flushed or when fluids are infused into it. This information is not intended to replace advice given to you by your health care provider. Make sure you discuss any questions you have with your health care provider. Document Released: 02/08/2005 Document Revised:  08/17/2015 Document Reviewed: 07/27/2015 Elsevier Interactive Patient Education  2017 [place]. Moderate Conscious Sedation, Adult, Care After These instructions provide you with information about caring for yourself after your procedure. Your health care provider may also give you more specific instructions. Your treatment has been planned according to current medical practices, but problems sometimes occur. Call your health care provider if you have any problems or questions after your procedure. What can I expect after the procedure? After your procedure, it is common:  To feel sleepy for several hours.  To feel clumsy and have poor balance for several hours.  To have poor judgment for  several hours.  To vomit if you eat too soon. Follow these instructions at home: For at least 24 hours after the procedure:    Do not:  Participate in activities where you could fall or become injured.  Drive.  Use heavy machinery.  Drink alcohol.  Take sleeping pills or medicines that cause drowsiness.  Make important decisions or sign legal documents.  Take care of children on your own.  Rest. Eating and drinking   Follow the diet recommended by your health care provider.  If you vomit:  Drink water, juice, or soup when you can drink without vomiting.  Make sure you have little or no nausea before eating solid foods. General instructions   Have a responsible adult stay with you until you are awake and alert.  Take over-the-counter and prescription medicines only as told by your health care provider.  If you smoke, do not smoke without supervision.  Keep all follow-up visits as told by your health care provider. This is important. Contact a health care provider if:  You keep feeling nauseous or you keep vomiting.  You feel light-headed.  You develop a rash.  You have a fever. Get help right away if:  You have trouble breathing. This information is not intended to replace advice given to you by your health care provider. Make sure you discuss any questions you have with your health care provider. Document Released: 10/08/2012 Document Revised: 05/23/2015 Document Reviewed: 04/09/2015 Elsevier Interactive Patient Education  2017 [employer].

## 2016-05-21 NOTE — Progress Notes (Signed)
Patient ID: Andres Smith, male   DOB: Jan 28, 1940, 76 y.o.   MRN: [id number] Patient given prescription for Percocet 5/325, #20, no refills, 1-2 tablets every 4-6 hours as needed for moderate to severe pain post-gastrostomy tube placement.

## 2016-05-22 ENCOUNTER — Encounter: Payer: PPO | Admitting: Nutrition

## 2016-05-22 ENCOUNTER — Ambulatory Visit: Payer: PPO

## 2016-05-22 ENCOUNTER — Ambulatory Visit
Admission: RE | Admit: 2016-05-22 | Discharge: 2016-05-22 | Disposition: A | Discharge status: Home / Self Care | Payer: PPO | Source: Ambulatory Visit | Attending: Radiation Oncology | Admitting: Radiation Oncology

## 2016-05-22 ENCOUNTER — Other Ambulatory Visit: Payer: Self-pay

## 2016-05-22 ENCOUNTER — Encounter: Payer: Self-pay | Admitting: *Deleted

## 2016-05-22 ENCOUNTER — Emergency Department (HOSPITAL_COMMUNITY)
Admission: EM | Admit: 2016-05-22 | Discharge: 2016-05-22 | Disposition: A | Discharge status: Home / Self Care | Payer: PPO | Attending: Emergency Medicine | Admitting: Emergency Medicine

## 2016-05-22 ENCOUNTER — Ambulatory Visit: Payer: PPO | Attending: Radiation Oncology | Admitting: Physical Therapy

## 2016-05-22 ENCOUNTER — Encounter (HOSPITAL_COMMUNITY): Payer: Self-pay

## 2016-05-22 ENCOUNTER — Emergency Department (HOSPITAL_COMMUNITY): Payer: PPO

## 2016-05-22 VITALS — BP 109/46 | HR 57 | Temp 97.7°F | Resp 12 | Ht 68.0 in | Wt 186.2 lb

## 2016-05-22 DIAGNOSIS — R55 Syncope and collapse: Secondary | ICD-10-CM | POA: Diagnosis not present

## 2016-05-22 DIAGNOSIS — Z7984 Long term (current) use of oral hypoglycemic drugs: Secondary | ICD-10-CM | POA: Insufficient documentation

## 2016-05-22 DIAGNOSIS — Z7982 Long term (current) use of aspirin: Secondary | ICD-10-CM | POA: Insufficient documentation

## 2016-05-22 DIAGNOSIS — Z85819 Personal history of malignant neoplasm of unspecified site of lip, oral cavity, and pharynx: Secondary | ICD-10-CM | POA: Diagnosis not present

## 2016-05-22 DIAGNOSIS — R1310 Dysphagia, unspecified: Secondary | ICD-10-CM

## 2016-05-22 DIAGNOSIS — I2510 Atherosclerotic heart disease of native coronary artery without angina pectoris: Secondary | ICD-10-CM | POA: Diagnosis not present

## 2016-05-22 DIAGNOSIS — C060 Malignant neoplasm of cheek mucosa: Secondary | ICD-10-CM

## 2016-05-22 DIAGNOSIS — Z951 Presence of aortocoronary bypass graft: Secondary | ICD-10-CM | POA: Insufficient documentation

## 2016-05-22 DIAGNOSIS — E119 Type 2 diabetes mellitus without complications: Secondary | ICD-10-CM | POA: Diagnosis not present

## 2016-05-22 DIAGNOSIS — Z87891 Personal history of nicotine dependence: Secondary | ICD-10-CM | POA: Diagnosis not present

## 2016-05-22 DIAGNOSIS — R001 Bradycardia, unspecified: Secondary | ICD-10-CM

## 2016-05-22 LAB — BASIC METABOLIC PANEL
Anion gap: 7 (ref 5–15)
BUN: 12 mg/dL (ref 6–20)
CO2: 26 mmol/L (ref 22–32)
Calcium: 8.6 mg/dL — ABNORMAL LOW (ref 8.9–10.3)
Chloride: 102 mmol/L (ref 101–111)
Creatinine, Ser: 0.9 mg/dL (ref 0.61–1.24)
GFR calc Af Amer: >60 mL/min (ref >60)
GFR calc non Af Amer: >60 mL/min (ref >60)
GLUCOSE: 170 mg/dL — AB (ref 65–99)
POTASSIUM: 4.3 mmol/L (ref 3.5–5.1)
SODIUM: 135 mmol/L (ref 135–145)

## 2016-05-22 LAB — URINALYSIS, ROUTINE W REFLEX MICROSCOPIC
BILIRUBIN URINE: NEGATIVE
GLUCOSE, UA: NEGATIVE mg/dL
HGB URINE DIPSTICK: NEGATIVE
Ketones, ur: NEGATIVE mg/dL
Leukocytes, UA: NEGATIVE
NITRITE: NEGATIVE
PH: 5 (ref 5.0–8.0)
Protein, ur: NEGATIVE mg/dL
SPECIFIC GRAVITY, URINE: 1.02 (ref 1.005–1.030)

## 2016-05-22 LAB — CBC
HEMATOCRIT: 32.9 % — AB (ref 39.0–52.0)
HEMOGLOBIN: 10.9 g/dL — AB (ref 13.0–17.0)
MCH: 35.4 pg — ABNORMAL HIGH (ref 26.0–34.0)
MCHC: 33.1 g/dL (ref 30.0–36.0)
MCV: 106.8 fL — AB (ref 78.0–100.0)
Platelets: 148 10*3/uL — ABNORMAL LOW (ref 150–400)
RBC: 3.08 MIL/uL — ABNORMAL LOW (ref 4.22–5.81)
RDW: 14.8 % (ref 11.5–15.5)
WBC: 8.2 10*3/uL (ref 4.0–10.5)

## 2016-05-22 LAB — HEPATIC FUNCTION PANEL
ALBUMIN: 3.6 g/dL (ref 3.5–5.0)
ALK PHOS: 40 U/L (ref 38–126)
ALT: 12 U/L — ABNORMAL LOW (ref 17–63)
AST: 19 U/L (ref 15–41)
BILIRUBIN TOTAL: 1.5 mg/dL — AB (ref 0.3–1.2)
Bilirubin, Direct: 0.3 mg/dL (ref 0.1–0.5)
Indirect Bilirubin: 1.2 mg/dL — ABNORMAL HIGH (ref 0.3–0.9)
Total Protein: 6.3 g/dL — ABNORMAL LOW (ref 6.5–8.1)

## 2016-05-22 LAB — POCT I-STAT TROPONIN I: TROPONIN I, POC: 0.01 ng/mL (ref 0.00–0.08)

## 2016-05-22 MED ORDER — SODIUM CHLORIDE 0.9 % IV BOLUS (SEPSIS)
1000.0000 mL | Freq: Once | INTRAVENOUS | Status: AC
  Administered 2016-05-22: 1000 mL via INTRAVENOUS

## 2016-05-22 NOTE — Progress Notes (Signed)
Called by Dr. Roderic Palau regarding Andres Smith. Was in the [place] today -became presyncopal, noted to be hypotensive and bradycardic, ?vagal - symptoms improved quickly after taken to the ER. Still having some variable bradycardia, but reportedly feels well. No chest pain. No ischemia on myoview in 01/2016. On low dose coreg. I advised holding coreg for now. We will arrange for outpatient follow-up in cardiology.  Pixie Casino, MD, [place]  Attending Cardiologist  Direct Dial: [phone]  Fax: [phone]  Website:  [email or web address]

## 2016-05-22 NOTE — Discharge Instructions (Signed)
Stop taking the Coreg. Follow-up with your cardiologist tomorrow at 1:30

## 2016-05-22 NOTE — ED Triage Notes (Signed)
Pt from radiation oncology.  Pt went yesterday for g-tube and port placement.  He was also given an enema for bloating.  Pt came today for standard appt.  Pt became short of breath and weak.  Almost passed out.  Hr between 30-50.  bp hypotensive in 60/40. Pt was SB with PVC on monitor.  Symptoms resolving in supine position.

## 2016-05-22 NOTE — Therapy (Signed)
[place] [address] [place], [place], [address] Phone: [phone]   Fax:  [phone]  Patient Details  Name: ALVIE FOWLES MRN: [id number] Date of Birth: August 23, 1940 Referring Provider:  [name], MD  Encounter Date: 05/22/2016  ST Arrived/Cancel  SLP initiated evaluation with pt but pt was unable to complete due to a medical issue. Will reschedule eval for a date TBD.   [facility] ,[place], [place] 05/22/2016, 10:59 AM  [facility] [address] [place], [place], [address] Phone: [phone]   Fax:  [phone]

## 2016-05-22 NOTE — ED Notes (Signed)
Coming from cancer center-episode of bradycardia-cardiac history

## 2016-05-22 NOTE — Progress Notes (Signed)
Patient was here today for [facility] today.  Patient states he took his blood pressure medications and Oxycodone this morning.  He also states he ate oatmeal this morning.  Patient started becoming diaphoretic and feeling lightheaded.  His overall appearance was diminishing.  Skin was pale and sweaty.  Blood pressure on machine was hypotensive 79/45 and manual bp 60/42.  Placed patient on telemetry monitor and found patient to be bradycardic as low as 36 and high as 52.  Transferred patient to stretcher and Dr. Isidore Moos completed assessment and decided to send patient to emergency room for further assessment.  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

## 2016-05-22 NOTE — Progress Notes (Signed)
Oncology Nurse Navigator Documentation  Met with Andres Smith during [facility] [place].  He was accompanied by his wife.  Arrived him to Nursing 0830, provided verbal and written overview of [place], the clinicians who will be seeing him, encouraged him to ask questions during his time with them.  MDC was interrupted d/t his feeling weak, "going to pass out".  Additional VS identified low BP, episodes of bradycardia.  Rapid Response called by RN Anderson Malta Malmfelt; they monitored patient condition, took him to ED.  Gayleen Orem, RN, BSN, [place] [facility] [place] [phone]

## 2016-05-22 NOTE — ED Provider Notes (Signed)
[facility] DEPT Provider Note   CSN: [id number] Arrival date & time: 05/22/16  1014     History   Chief Complaint Chief Complaint  Patient presents with  . Near Syncope    HPI Andres Smith is a 76 y.o. male.  Patient states that he was getting some information at the cancer center about his radiation and chemotherapy treatment. He became very weak and dizzy and nearly passed out. Patient's blood pressure was 60 and his pulse was in the 30s. Patient has a history of bypass surgery   The history is provided by the patient. No language interpreter was used.  Loss of Consciousness   This is a new problem. The current episode started less than 1 hour ago. The problem occurs rarely. The problem has been resolved. There was no loss of consciousness. The problem is associated with normal activity. Associated symptoms include dizziness, visual change and weakness. Pertinent negatives include abdominal pain, back pain, chest pain, congestion, headaches and seizures. He has tried nothing for the symptoms. The treatment provided significant relief.    Past Medical History:  Diagnosis Date  . Ankylosing spondylitis (Andres Smith)   . Blood transfusion    hx of transfusion without reaction  . Bradycardia   . Cancer ([place]) 03/2016   oral  . Cholelithiasis   . Coronary artery disease    a. s/p CABG x 6 in 1997 (LIMA->LAD, VG->RI ->OM1->OM2, VG->PDA->PLV // b. 05/2011 Cath:  patent grafs, native prox rca and d2 dzs  ->med rx. // c. Myoview 1/18: not gated, large inferolateral scar, no ischemia; Intermediate Risk (IL scar old - on prior studies >> med rx)  . Crohn's colitis (Andres Smith) 12/01/1997  . Diabetes mellitus   . Dysphagia   . Fatty liver   . GERD (gastroesophageal reflux disease)   . Heart block   . Hiatal hernia 12/03/2008  . History of skin cancer   . Hx of echocardiogram    Echo 5/14:  Mild LVH, EF 55-60%, NL diast fxn, mild LAE   . Hyperlipidemia   . Hypertension   . Kidney stones   .  Neck pain   . Pneumonia    hx of PNA  . 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 B12 deficiency anemia 08/03/2013  . ULCERATIVE COLITIS-UNIVERSAL 01/17/2010  . Occlusion and stenosis of carotid artery without mention of cerebral infarction 09/20/2009  . GERD 12/02/2008  . 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 Hyperlipidemia with target LDL less than 70 12/04/2007  . Essential hypertension, benign 12/04/2007  . Ankylosing spondylitis (Andres Smith) 12/04/2007  . Coronary atherosclerosis of native coronary artery 12/04/2007  . 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 cancer removal  2014   removed from left outter leg  . COLONOSCOPY  2012  . CORONARY ARTERY BYPASS GRAFT  04/19/1995   x7  . EXCISION ORAL TUMOR Left    left jaw and lymph node  . HERNIA REPAIR  1989  . IR FLUORO GUIDE PORT INSERTION RIGHT  05/21/2016  . IR GASTROSTOMY TUBE MOD SED  05/21/2016  . IR US GUIDE VASC ACCESS RIGHT  05/21/2016  . LEFT HEART CATHETERIZATION WITH CORONARY/GRAFT ANGIOGRAM N/A 05/24/2011   Procedure: LEFT HEART CATHETERIZATION WITH Beatrix Fetters;  Surgeon: Larey Dresser, MD;  Location: [facility] CATH LAB;  Service: Cardiovascular;  Laterality: N/A;  . MOHS SURGERY     X 2 off chin and nose  . TONSILLECTOMY AND ADENOIDECTOMY  1944  . TRIGGER FINGER RELEASE    . UMBILICAL HERNIA REPAIR         Home Medications    Prior to Admission medications   Medication Sig Start Date End Date Taking? Authorizing Provider  aspirin EC 81 MG tablet Take 81 mg by mouth daily.   Yes [provider]  atorvastatin (LIPITOR) 80 MG tablet Take 80 mg by mouth at bedtime.   Yes [provider]  carvedilol (COREG) 3.125 MG tablet Take 1 tablet (3.125 mg total) by mouth 2 (two) times  daily with a meal. 03/26/16  Yes Weaver, Scott T, PA-C  cyanocobalamin (,VITAMIN B-12,) 1000 MCG/ML injection INJECT 1ML ONCE MONTHLY. 05/22/16  Yes Janith Lima, MD  isosorbide mononitrate (IMDUR) 60 MG 24 hr tablet Take 90 mg by mouth daily.   Yes [provider]  losartan (COZAAR) 100 MG tablet Take 1 tablet (100 mg total) by mouth daily. 02/29/16  Yes Sherren Mocha, MD  mercaptopurine (PURINETHOL) 50 MG tablet TAKE ONE TABLET ONCE A DAY ON AN EMPTY STOMACH 1 HR BEFORE OR 2HRS AFTER A MEAL 04/02/16  Yes Pyrtle, Lajuan Lines, MD  mesalamine (LIALDA) 1.2 g EC tablet TAKE 2 TABLETS EVERY DAY WITH BREAKFAST Patient taking differently: Take 2.4 g by mouth daily with breakfast.  04/03/16  Yes Pyrtle, Lajuan Lines, MD  metFORMIN (GLUCOPHAGE) 500 MG tablet Take 1 tablet (500 mg total) by mouth 2 (two) times daily with a meal. Must Keep 05/16/15 appt for future refills 04/19/15  Yes Janith Lima, MD  oxyCODONE (OXY IR/ROXICODONE) 5 MG immediate release tablet Take 5 mg by mouth every 6 (six) hours as needed for severe pain.   Yes [provider]  nitroGLYCERIN (NITROSTAT) 0.4 MG SL tablet Place 0.4 mg under the tongue every 5 (five) minutes as needed for chest pain.    [provider]    Family History Family History  Problem Relation Age of Onset  . Heart disease Father   . Heart disease Mother   . Arthritis Mother   . Heart disease Unknown   . Crohn's disease Unknown   . Heart disease Sister   . Cancer Paternal Grandmother        colon  . Colon cancer Neg Hx     Social History Social History  Substance Use Topics  . Smoking status: Former Smoker    Quit date: 01/02/1983  . Smokeless tobacco: Former Systems developer    Types: Chew    Quit date: 01/01/2010  . Alcohol use Yes     Comment: tottie every night     Allergies   Patient has no known allergies.   Review of Systems Review of Systems  Constitutional: Negative for appetite change and fatigue.  HENT: Negative for congestion,  ear discharge and sinus pressure.   Eyes: Negative for discharge.  Respiratory: Negative for cough.   Cardiovascular: Positive for syncope. Negative for chest pain.  Gastrointestinal: Negative for abdominal pain and diarrhea.  Genitourinary: Negative for frequency and hematuria.  Musculoskeletal: Negative for back pain.  Skin: Negative for rash.  Neurological: Positive for dizziness and weakness. Negative for seizures and headaches.  Psychiatric/Behavioral: Negative for hallucinations.     Physical Exam Updated Vital Signs BP (!) 110/50 (BP Location: Left Arm)   Pulse (!) 47   Resp 17   SpO2  98%   Physical Exam  Constitutional: He is oriented to person, place, and time. He appears well-developed.  HENT:  Head: Normocephalic.  Eyes: Conjunctivae and EOM are normal. No scleral icterus.  Neck: Neck supple. No thyromegaly present.  Cardiovascular: Regular rhythm.  Exam reveals no gallop and no friction rub.   No murmur heard. Bradycardia  Pulmonary/Chest: No stridor. He has no wheezes. He has no rales. He exhibits no tenderness.  Abdominal: He exhibits no distension. There is no tenderness. There is no rebound.  Musculoskeletal: Normal range of motion. He exhibits no edema.  Lymphadenopathy:    He has no cervical adenopathy.  Neurological: He is oriented to person, place, and time. He exhibits normal muscle tone. Coordination normal.  Skin: No rash noted. No erythema.  Psychiatric: He has a normal mood and affect. His behavior is normal.     ED Treatments / Results  Labs (all labs ordered are listed, but only abnormal results are displayed) Labs Reviewed  BASIC METABOLIC PANEL - Abnormal; Notable for the following:       Result Value   Glucose, Bld 170 (*)    Calcium 8.6 (*)    All other components within normal limits  CBC - Abnormal; Notable for the following:    RBC 3.08 (*)    Hemoglobin 10.9 (*)    HCT 32.9 (*)    MCV 106.8 (*)    MCH 35.4 (*)    Platelets 148  (*)    All other components within normal limits  HEPATIC FUNCTION PANEL - Abnormal; Notable for the following:    Total Protein 6.3 (*)    ALT 12 (*)    Total Bilirubin 1.5 (*)    Indirect Bilirubin 1.2 (*)    All other components within normal limits  URINALYSIS, ROUTINE W REFLEX MICROSCOPIC  CBG MONITORING, ED  Randolm Idol, ED    EKG  EKG Interpretation None       Radiology Dg Chest 2 View  Result Date: 05/22/2016 CLINICAL DATA:  Syncope. EXAM: CHEST  2 VIEW COMPARISON:  Radiograph of May 21, 2016. FINDINGS: Stable cardiomediastinal silhouette. Status post coronary artery bypass graft. No pneumothorax or pleural effusion is noted. No acute pulmonary disease is noted. Right internal jugular Port-A-Cath is noted with distal tip in expected position of cavoatrial junction. Bony thorax is unremarkable. IMPRESSION: No active cardiopulmonary disease. Electronically Signed   By: Marijo Conception, M.D.   On: 05/22/2016 11:59   Ct Abdomen Pelvis W Contrast  Result Date: 05/21/2016 CLINICAL DATA:  Acute onset of shortness of breath and generalized abdominal pain. Recent G-tube placement. Initial encounter. EXAM: CT ABDOMEN AND PELVIS WITH CONTRAST TECHNIQUE: Multidetector CT imaging of the abdomen and pelvis was performed using the standard protocol following bolus administration of intravenous contrast. CONTRAST:  142m ISOVUE-300 IOPAMIDOL (ISOVUE-300) INJECTION 61% COMPARISON:  CT of the abdomen and pelvis performed 05/11/2016 FINDINGS: Lower chest: Diffuse coronary artery calcifications are seen. The patient is status post median sternotomy. Bibasilar atelectasis or scarring is noted. Hepatobiliary: The liver is unremarkable in appearance. A stone is noted dependently within the gallbladder. The gallbladder is otherwise unremarkable. The common bile duct remains normal in caliber. Pancreas: The pancreas is within normal limits. Spleen: The spleen is unremarkable in appearance.  Adrenals/Urinary Tract: The adrenal glands are unremarkable in appearance. Bilateral renal cysts are noted. Nonspecific perinephric stranding is noted bilaterally. There is no evidence of hydronephrosis. No renal or ureteral stones are identified. Stomach/Bowel: The patient's G-tube  is noted at the body of the stomach. A small amount of free air is seen tracking within the abdomen, reflecting recent surgery. The small bowel is within normal limits. The appendix is not visualized; there is no evidence for appendicitis. There is wall thickening along the distal sigmoid colon and rectum, raising question for mild proctitis. The colon is otherwise unremarkable in appearance. Vascular/Lymphatic: Scattered calcification is seen along the abdominal aorta and its branches. The abdominal aorta is otherwise grossly unremarkable. The inferior vena cava is grossly unremarkable. No retroperitoneal lymphadenopathy is seen. No pelvic sidewall lymphadenopathy is identified. Reproductive: The bladder is mildly distended. Mild bladder wall thickening could reflect cystitis. The prostate is enlarged, measuring 5.6 cm in transverse dimension, with mild heterogeneity. Other: A small left inguinal hernia is noted, containing only fat. Musculoskeletal: No acute osseous abnormalities are identified. Anterior bridging osteophytes are noted along the lower thoracic and upper lumbar spine. Facet disease is noted along the lumbar spine. The visualized musculature is unremarkable in appearance. IMPRESSION: 1. Wall thickening along the distal sigmoid colon rectum raises question for mild proctitis. 2. Mild bladder wall thickening could reflect cystitis. 3. Postoperative free air within the abdomen, given recent surgery. G-tube noted ending at the body of the stomach. 4. Diffuse coronary artery calcifications seen. 5. Cholelithiasis.  Gallbladder otherwise unremarkable. 6. Bilateral renal cysts noted. 7. Scattered aortic atherosclerosis. 8.  Enlarged prostate, with mild heterogeneity. Would correlate with PSA. 9. Small left inguinal hernia, containing only fat. 10. Anterior bridging osteophytes along the lower thoracic and upper lumbar spine. Electronically Signed   By: Garald Balding M.D.   On: 05/21/2016 19:21   Ir Gastrostomy Tube  Result Date: 05/21/2016 INDICATION: History of head neck cancer, in need of durable intravenous access for chemotherapy administration. In need of enteric access for enteric nutrition supplementation prior to initiation of chemoradiation. EXAM: 1. IMPLANTED PORT A CATH PLACEMENT WITH ULTRASOUND AND FLUOROSCOPIC GUIDANCE 2. FLUOROSCOPIC GUIDED PERCUTANEOUS GASTROSTOMY TUBE PLACEMENT COMPARISON:  CT the chest, abdomen pelvis - 05/12/2018 MEDICATIONS: Glucagon 1 mg IV; Ancef 2 g IV; The antibiotic was administered within an appropriate time interval prior to skin puncture. ANESTHESIA/SEDATION: Moderate (conscious) sedation was employed during this procedure. A total of Versed 6 mg and Fentanyl 100 mcg was administered intravenously. Moderate Sedation Time: 45 minutes. The patient's level of consciousness and vital signs were monitored continuously by radiology nursing throughout the procedure under my direct supervision. CONTRAST:  20 - cc Isovue 300, administered into the stomach lumen. FLUOROSCOPY TIME:  A total of 1 minute, 54 seconds (38.2 mGy) fluoroscopy time was utilized for both procedures. COMPLICATIONS: None immediate. PROCEDURE: The procedures, risks, benefits, and alternatives were explained to the patient. Questions regarding the procedure were encouraged and answered. The patient understands and consents to both procedures. Attention was initially paid towards placement of the [facility] a Catheter. The right neck and chest were prepped with chlorhexidine in a sterile fashion, and a sterile drape was applied covering the operative field. Maximum barrier sterile technique with sterile gowns and gloves were used for  the procedure. A timeout was performed prior to the initiation of the procedure. Local anesthesia was provided with 1% lidocaine with epinephrine. After creating a small venotomy incision, a micropuncture kit was utilized to access the internal jugular vein under direct, real-time ultrasound guidance. Ultrasound image documentation was performed. The microwire was kinked to measure appropriate catheter length. A subcutaneous port pocket was then created along the upper chest wall utilizing a combination of  Olden and blunt dissection. The pocket was irrigated with sterile saline. A single lumen thin power injectable port was chosen for placement. The 8 Fr catheter was tunneled from the port pocket site to the venotomy incision. The port was placed in the pocket. The external catheter was trimmed to appropriate length. At the venotomy, an 8 Fr peel-away sheath was placed over a guidewire under fluoroscopic guidance. The catheter was then placed through the sheath and the sheath was removed. Final catheter positioning was confirmed and documented with a fluoroscopic spot radiograph. The port was accessed with a Huber needle, aspirated and flushed with heparinized saline. The venotomy site was closed with an interrupted 4-0 Vicryl suture. The port pocket incision was closed with interrupted 2-0 Vicryl suture and the skin was opposed with a running subcuticular 4-0 Vicryl suture. Dermabond and Steri-strips were applied to both incisions. Dressings were placed. ____________________________________________ Next, attention was paid towards placement of the gastrostomy tube. The left upper quadrant was sterilely prepped and draped. An oral gastric catheter was inserted into the stomach under fluoroscopy. The existing nasogastric feeding tube was removed. The left costal margin and air / barium opacified transverse colon were identified and avoided. Air was injected into the stomach for insufflation and visualization under  fluoroscopy. Under sterile conditions a 17 gauge trocar needle was utilized to access the stomach percutaneously beneath the left subcostal margin after the overlying soft tissues were anesthetized with 1% Lidocaine with epinephrine. Needle position was confirmed within the stomach with aspiration of air and injection of small amount of contrast. A single T tack was deployed for gastropexy. Over an Amplatz guide wire, a 9-French sheath was inserted into the stomach. A snare device was utilized to capture the oral gastric catheter. The snare device was pulled retrograde from the stomach up the esophagus and out the oropharynx. The 20-French pull-through gastrostomy was connected to the snare device and pulled antegrade through the oropharynx down the esophagus into the stomach and then through the percutaneous tract external to the patient. The gastrostomy was assembled externally. Contrast injection confirms position in the stomach. Several spot radiographic images were obtained in various obliquities for documentation. Dressings were placed. The patient tolerated both above procedures well without immediate post procedural complication. FINDINGS: After catheter placement, the tip lies within the superior cavoatrial junction. The catheter aspirates and flushes normally and is ready for immediate use. After successful fluoroscopic guided placement, the gastrostomy tube is appropriately positioned with internal disc against the ventral aspect of the gastric lumen. IMPRESSION: 1. Successful placement of a right internal jugular approach power injectable Port-A-Cath. The Port a catheter is ready for immediate use. 2. Successful fluoroscopic insertion of a 20-French pull-through gastrostomy tube. The gastrostomy may be used immediately for medication administration and may be utilized in 24 hrs for the initiation of feeds. Electronically Signed   By: Sandi Mariscal M.D.   On: 05/21/2016 12:48   Ir US Guide Vasc Access  Right  Result Date: 05/21/2016 INDICATION: History of head neck cancer, in need of durable intravenous access for chemotherapy administration. In need of enteric access for enteric nutrition supplementation prior to initiation of chemoradiation. EXAM: 1. IMPLANTED PORT A CATH PLACEMENT WITH ULTRASOUND AND FLUOROSCOPIC GUIDANCE 2. FLUOROSCOPIC GUIDED PERCUTANEOUS GASTROSTOMY TUBE PLACEMENT COMPARISON:  CT the chest, abdomen pelvis - 05/12/2018 MEDICATIONS: Glucagon 1 mg IV; Ancef 2 g IV; The antibiotic was administered within an appropriate time interval prior to skin puncture. ANESTHESIA/SEDATION: Moderate (conscious) sedation was employed during this procedure.  A total of Versed 6 mg and Fentanyl 100 mcg was administered intravenously. Moderate Sedation Time: 45 minutes. The patient's level of consciousness and vital signs were monitored continuously by radiology nursing throughout the procedure under my direct supervision. CONTRAST:  20 - cc Isovue 300, administered into the stomach lumen. FLUOROSCOPY TIME:  A total of 1 minute, 54 seconds (38.2 mGy) fluoroscopy time was utilized for both procedures. COMPLICATIONS: None immediate. PROCEDURE: The procedures, risks, benefits, and alternatives were explained to the patient. Questions regarding the procedure were encouraged and answered. The patient understands and consents to both procedures. Attention was initially paid towards placement of the [facility] a Catheter. The right neck and chest were prepped with chlorhexidine in a sterile fashion, and a sterile drape was applied covering the operative field. Maximum barrier sterile technique with sterile gowns and gloves were used for the procedure. A timeout was performed prior to the initiation of the procedure. Local anesthesia was provided with 1% lidocaine with epinephrine. After creating a small venotomy incision, a micropuncture kit was utilized to access the internal jugular vein under direct, real-time ultrasound  guidance. Ultrasound image documentation was performed. The microwire was kinked to measure appropriate catheter length. A subcutaneous port pocket was then created along the upper chest wall utilizing a combination of Dawn and blunt dissection. The pocket was irrigated with sterile saline. A single lumen thin power injectable port was chosen for placement. The 8 Fr catheter was tunneled from the port pocket site to the venotomy incision. The port was placed in the pocket. The external catheter was trimmed to appropriate length. At the venotomy, an 8 Fr peel-away sheath was placed over a guidewire under fluoroscopic guidance. The catheter was then placed through the sheath and the sheath was removed. Final catheter positioning was confirmed and documented with a fluoroscopic spot radiograph. The port was accessed with a Huber needle, aspirated and flushed with heparinized saline. The venotomy site was closed with an interrupted 4-0 Vicryl suture. The port pocket incision was closed with interrupted 2-0 Vicryl suture and the skin was opposed with a running subcuticular 4-0 Vicryl suture. Dermabond and Steri-strips were applied to both incisions. Dressings were placed. ____________________________________________ Next, attention was paid towards placement of the gastrostomy tube. The left upper quadrant was sterilely prepped and draped. An oral gastric catheter was inserted into the stomach under fluoroscopy. The existing nasogastric feeding tube was removed. The left costal margin and air / barium opacified transverse colon were identified and avoided. Air was injected into the stomach for insufflation and visualization under fluoroscopy. Under sterile conditions a 17 gauge trocar needle was utilized to access the stomach percutaneously beneath the left subcostal margin after the overlying soft tissues were anesthetized with 1% Lidocaine with epinephrine. Needle position was confirmed within the stomach with  aspiration of air and injection of small amount of contrast. A single T tack was deployed for gastropexy. Over an Amplatz guide wire, a 9-French sheath was inserted into the stomach. A snare device was utilized to capture the oral gastric catheter. The snare device was pulled retrograde from the stomach up the esophagus and out the oropharynx. The 20-French pull-through gastrostomy was connected to the snare device and pulled antegrade through the oropharynx down the esophagus into the stomach and then through the percutaneous tract external to the patient. The gastrostomy was assembled externally. Contrast injection confirms position in the stomach. Several spot radiographic images were obtained in various obliquities for documentation. Dressings were placed. The patient tolerated both  above procedures well without immediate post procedural complication. FINDINGS: After catheter placement, the tip lies within the superior cavoatrial junction. The catheter aspirates and flushes normally and is ready for immediate use. After successful fluoroscopic guided placement, the gastrostomy tube is appropriately positioned with internal disc against the ventral aspect of the gastric lumen. IMPRESSION: 1. Successful placement of a right internal jugular approach power injectable Port-A-Cath. The Port a catheter is ready for immediate use. 2. Successful fluoroscopic insertion of a 20-French pull-through gastrostomy tube. The gastrostomy may be used immediately for medication administration and may be utilized in 24 hrs for the initiation of feeds. Electronically Signed   By: Sandi Mariscal M.D.   On: 05/21/2016 12:48   Dg Chest Port 1 View  Result Date: 05/21/2016 CLINICAL DATA:  Shortness of Breath EXAM: PORTABLE CHEST 1 VIEW COMPARISON:  05/11/2016 FINDINGS: Cardiac shadow is within normal limits. Postoperative changes are seen. Right chest wall port is noted. No focal infiltrate or sizable effusion is seen. No acute bony  abnormality is noted. IMPRESSION: No acute abnormality seen. Electronically Signed   By: [name] M.D.   On: 05/21/2016 17:17   Dg Abd Portable 1 View  Result Date: 05/21/2016 CLINICAL DATA:  Abdominal distension and recent G-tube placement EXAM: PORTABLE ABDOMEN - 1 VIEW COMPARISON:  None. FINDINGS: Scattered large and small bowel gas is noted. The small bowel loops are mildly prominent likely representing an ileus. Gastrostomy catheter is noted just to the left of the midline. No other focal abnormality is seen. IMPRESSION: Changes most consistent with a small-bowel ileus. Although not mentioned in the body of the report there may be a small amount of free intraperitoneal air likely related to the recent gastrostomy placement. Electronically Signed   By: [name] M.D.   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 In need of enteric access for enteric nutrition supplementation prior to initiation of chemoradiation. EXAM: 1. IMPLANTED PORT A CATH PLACEMENT WITH ULTRASOUND AND FLUOROSCOPIC GUIDANCE 2. FLUOROSCOPIC GUIDED PERCUTANEOUS GASTROSTOMY TUBE PLACEMENT COMPARISON:  CT the chest, abdomen pelvis - 05/12/2018 MEDICATIONS: Glucagon 1 mg IV; Ancef 2 g IV; The antibiotic was administered within an appropriate time interval prior to skin puncture. ANESTHESIA/SEDATION: Moderate (conscious) sedation was employed during this procedure. A total of Versed 6 mg and Fentanyl 100 mcg was administered intravenously. Moderate Sedation Time: 45 minutes. The patient's level of consciousness and vital signs were monitored continuously by radiology nursing throughout the procedure under my direct supervision. CONTRAST:  20 - cc Isovue 300, administered into the stomach lumen. FLUOROSCOPY TIME:  A total of 1 minute, 54 seconds (38.2 mGy) fluoroscopy time was  utilized for both procedures. COMPLICATIONS: None immediate. PROCEDURE: The procedures, risks, benefits, and alternatives were explained to the patient. Questions regarding the procedure were encouraged and answered. The patient understands and consents to both procedures. Attention was initially paid towards placement of the [facility] a Catheter. The right neck and chest were prepped with chlorhexidine in a sterile fashion, and a sterile drape was applied covering the operative field. Maximum barrier sterile technique with sterile gowns and gloves were used for the procedure. A timeout was performed prior to the initiation of the procedure. Local anesthesia was provided with 1% lidocaine with epinephrine. After creating a small venotomy incision, a micropuncture kit was utilized to access the internal jugular vein under direct,  real-time ultrasound guidance. Ultrasound image documentation was performed. The microwire was kinked to measure appropriate catheter length. A subcutaneous port pocket was then created along the upper chest wall utilizing a combination of [place] and blunt dissection. The pocket was irrigated with sterile saline. A single lumen thin power injectable port was chosen for placement. The 8 Fr catheter was tunneled from the port pocket site to the venotomy incision. The port was placed in the pocket. The external catheter was trimmed to appropriate length. At the venotomy, an 8 Fr peel-away sheath was placed over a guidewire under fluoroscopic guidance. The catheter was then placed through the sheath and the sheath was removed. Final catheter positioning was confirmed and documented with a fluoroscopic spot radiograph. The port was accessed with a Huber needle, aspirated and flushed with heparinized saline. The venotomy site was closed with an interrupted 4-0 Vicryl suture. The port pocket incision was closed with interrupted 2-0 Vicryl suture and the skin was opposed with a running subcuticular 4-0  Vicryl suture. Dermabond and Steri-strips were applied to both incisions. Dressings were placed. ____________________________________________ Next, attention was paid towards placement of the gastrostomy tube. The left upper quadrant was sterilely prepped and draped. An oral gastric catheter was inserted into the stomach under fluoroscopy. The existing nasogastric feeding tube was removed. The left costal margin and air / barium opacified transverse colon were identified and avoided. Air was injected into the stomach for insufflation and visualization under fluoroscopy. Under sterile conditions a 17 gauge trocar needle was utilized to access the stomach percutaneously beneath the left subcostal margin after the overlying soft tissues were anesthetized with 1% Lidocaine with epinephrine. Needle position was confirmed within the stomach with aspiration of air and injection of small amount of contrast. A single T tack was deployed for gastropexy. Over an Amplatz guide wire, a 9-French sheath was inserted into the stomach. A snare device was utilized to capture the oral gastric catheter. The snare device was pulled retrograde from the stomach up the esophagus and out the oropharynx. The 20-French pull-through gastrostomy was connected to the snare device and pulled antegrade through the oropharynx down the esophagus into the stomach and then through the percutaneous tract external to the patient. The gastrostomy was assembled externally. Contrast injection confirms position in the stomach. Several spot radiographic images were obtained in various obliquities for documentation. Dressings were placed. The patient tolerated both above procedures well without immediate post procedural complication. FINDINGS: After catheter placement, the tip lies within the superior cavoatrial junction. The catheter aspirates and flushes normally and is ready for immediate use. After successful fluoroscopic guided placement, the gastrostomy  tube is appropriately positioned with internal disc against the ventral aspect of the gastric lumen. IMPRESSION: 1. Successful placement of a right internal jugular approach power injectable Port-A-Cath. The Port a catheter is ready for immediate use. 2. Successful fluoroscopic insertion of a 20-French pull-through gastrostomy tube. The gastrostomy may be used immediately for medication administration and may be utilized in 24 hrs for the initiation of feeds. Electronically Signed   By: Sandi Mariscal M.D.   On: 05/21/2016 12:48    Procedures Procedures (including critical care time)  Medications Ordered in ED Medications  sodium chloride 0.9 % bolus 1,000 mL (0 mLs Intravenous Stopped 05/22/16 1505)     Initial Impression / Assessment and Plan / ED Course  I have reviewed the triage vital signs and the nursing notes.  Pertinent labs & imaging results that were available during my care of  the patient were reviewed by me and considered in my medical decision making (see chart for details).     EKG showed no acute changes troponin was normal. Patient's blood pressure normalized. He has been bradycardic in the emergency department anywhere from 40 to 31s. Mild irregularity. Patient mostly had sinus bradycardia. I spoke with cardiology and will stop his Coreg and they will follow up with him tomorrow  Final Clinical Impressions(s) / ED Diagnoses   Final diagnoses:  Near syncope    New Prescriptions New Prescriptions   No medications on file     Milton Ferguson, MD 05/22/16 [phone]

## 2016-05-22 NOTE — ED Notes (Signed)
RN AND MD NOTIFIED OF PATIENT'S TROPONIN LEVEL OF 0.01

## 2016-05-22 NOTE — ED Notes (Signed)
Bed: WA16 Expected date:  Expected time:  Means of arrival:  Comments: [facility] 

## 2016-05-22 NOTE — ED Notes (Signed)
Bed: RESA Expected date:  Expected time:  Means of arrival:  Comments: Cancer center 

## 2016-05-23 ENCOUNTER — Encounter: Payer: Self-pay | Admitting: *Deleted

## 2016-05-23 ENCOUNTER — Other Ambulatory Visit: Payer: PPO

## 2016-05-23 ENCOUNTER — Ambulatory Visit (INDEPENDENT_AMBULATORY_CARE_PROVIDER_SITE_OTHER): Payer: PPO | Admitting: Cardiovascular Disease

## 2016-05-23 ENCOUNTER — Encounter: Payer: Self-pay | Admitting: Cardiovascular Disease

## 2016-05-23 ENCOUNTER — Ambulatory Visit
Admission: RE | Admit: 2016-05-23 | Discharge: 2016-05-23 | Disposition: A | Discharge status: Home / Self Care | Payer: PPO | Source: Ambulatory Visit | Attending: Radiation Oncology | Admitting: Radiation Oncology

## 2016-05-23 ENCOUNTER — Ambulatory Visit: Admission: RE | Admit: 2016-05-23 | Payer: PPO | Source: Ambulatory Visit

## 2016-05-23 ENCOUNTER — Ambulatory Visit (HOSPITAL_BASED_OUTPATIENT_CLINIC_OR_DEPARTMENT_OTHER): Payer: PPO | Admitting: Hematology and Oncology

## 2016-05-23 ENCOUNTER — Encounter: Payer: Self-pay | Admitting: Hematology and Oncology

## 2016-05-23 VITALS — BP 122/52 | HR 64 | Temp 98.1°F | Resp 17 | Ht 68.0 in | Wt 185.7 lb

## 2016-05-23 VITALS — BP 116/58 | HR 80 | Ht 68.0 in | Wt 187.0 lb

## 2016-05-23 DIAGNOSIS — C060 Malignant neoplasm of cheek mucosa: Secondary | ICD-10-CM | POA: Diagnosis not present

## 2016-05-23 DIAGNOSIS — R001 Bradycardia, unspecified: Secondary | ICD-10-CM | POA: Diagnosis not present

## 2016-05-23 DIAGNOSIS — I2510 Atherosclerotic heart disease of native coronary artery without angina pectoris: Secondary | ICD-10-CM | POA: Diagnosis not present

## 2016-05-23 DIAGNOSIS — R55 Syncope and collapse: Secondary | ICD-10-CM | POA: Diagnosis not present

## 2016-05-23 DIAGNOSIS — Z510 Encounter for antineoplastic radiation therapy: Secondary | ICD-10-CM | POA: Diagnosis not present

## 2016-05-23 LAB — CBG MONITORING, ED: GLUCOSE-CAPILLARY: 159 mg/dL — AB (ref 65–99)

## 2016-05-23 MED ORDER — PROCHLORPERAZINE MALEATE 10 MG PO TABS: 10.0000 mg | ORAL_TABLET | Freq: Four times a day (QID) | ORAL | 1 refills | Status: DC | PRN

## 2016-05-23 MED ORDER — ONDANSETRON HCL 8 MG PO TABS: 8.0000 mg | ORAL_TABLET | Freq: Two times a day (BID) | ORAL | 1 refills | Status: DC | PRN

## 2016-05-23 MED ORDER — LIDOCAINE-PRILOCAINE 2.5-2.5 % EX CREA: TOPICAL_CREAM | CUTANEOUS | 3 refills | Status: DC

## 2016-05-23 NOTE — Progress Notes (Signed)
Oncology Nurse Navigator Documentation  Met with patient during Established Patient appt with Dr. Alvy Bimler. They voiced understanding lower dose of chemo, scheduled to start this Friday, will be administered in context of yesterday's syncopal and bradycardia episode. They proceeded to [employer] followed by [employer].  Gayleen Orem, RN, BSN, [place] [facility] Nurse [place] [facility] [place] [phone]

## 2016-05-23 NOTE — Progress Notes (Signed)
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 Tumor measures 0.9 cm in greatest dimension. Tumor extends to the inked deep tissue edge. Depth of invasion: 4 mm (in this material) See note.  Note: an immunostains for P16 was performed at an outside hospital and provided for [place] to review; p16 immunostain is negative. Per outside report, In situ hybridization for high-risk HPV types showed no evidence of transcriptionally active high-risk HPV types.      03/21/2016 Initial Diagnosis    Salient findings:  -EAC's clear -Lesion within left buccal mucosa (pictured below) with about 60m depth. Width is about 2/3 of the left buccal mucosal surface area by palpation. Extends to the oral commissure.  -Tongue soft and mobile without mucosal lesions -Neck soft with no palpable neck masses -Right handed -Right hand is pink and warm with Allen's test negative (normal)  -Left forearm very sun damaged with multiple treated areas from precancerous or cancerous areas. Right forearm not as dramatic, presumably from sun exposure while driving      [id number]    RESECTION OF ORAL BUCCAL MUCOSA SELECTIVE NECK DISSECTION FREE FLAP RADIAL FOREARM PR SPLIT GInkster<100 SQCM [15100] (SPLIT THICKNESS SKIN GRAFT LEG         04/13/2016 Pathology Results    A.LEFT ORAL COMMISSURE INFERIOR, EXCISION: No malignancy identified.  B.LEFT ORAL COMMISSURE SUPERIOR, EXCISION: No malignancy identified.  C.LEFT DEEP ORAL COMMISSURE, EXCISION: No malignancy identified.  D.RIGHT POSTERIOR BUCCAL INFERIOR, EXCISION: No malignancy identified.  E.RIGHT POSTERIOR BUCCAL SUPERIOR, EXCISION: No malignancy identified.  F.ORAL CAVITY, LEFT BUCCAL, EXCISION:  Invasive squamous cell carcinoma, moderately differentiated,  keratinizing. Tumor size:2.2 cm. Tumor depth of invasion:0.8 cm. Perineural invasion identified. Margins negative for malignancy. Pathologic stage:pT2 pN2a. See tumor protocol summary below.  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 Tumor deposit size:1.1 cm. Extranodal extension present. Benign salivary gland tissue.  SURGICAL PATHOLOGY CANCER CASE SUMMARY (AJCC 8TH EDITION) LIP AND ORAL CAVITY CAP Protocol posting date: June, 2017 Version: LipOralCavity 4.0.0.0  PROCEDURE: Wide resection of buccal mucosa TUMOR SITE:Oral:Buccal mucosa TUMOR LATERALITY: Left TUMOR FOCALITY: Unifocal TUMOR SIZE:Greatest dimension: 2.2 cm TUMOR DEPTH OF INVASION (DOI):8 mm HISTOLOGIC TYPE:Squamous cell carcinoma, conventional HISTOLOGIC GRADE:G2: Moderately differentiated SPECIMEN MARGINS:Uninvolved by invasive carcinoma  DISTANCE FROM CLOSEST MARGIN:1 mm  SPECIFY MARGIN(S):Deep LYMPHOVASCULAR INVASION:Present PERINEURAL INVASION: Present REGIONAL LYMPH NODES: NUMBER OF LYMPH NODES INVOLVED: 1 NUMBER OF LYMPH NODES EXAMINED: 4 LATERALITY OF LYMPH NODES INVOLVED: Ipsilateral SIZE OF LARGEST METASTATIC DEPOSIT:1.1 cm EXTRANODAL EXTENSION:Present PATHOLOGIC STAGE CLASSIFICATION (pTNM, AJCC 8th  Ed): pT2 pN2a pT2: umor >2 cm but <=4 cm and <=10 mm DOI pN2a:Metastasis in a single ipsilateral lymph node, 3 cm or  smaller in greatest dimension and ENE(+) BIOMARKERS:performed on prior outside biopsy ([facility] case [id number] outside case [id number] P16 (Immunohistochemistry):Negative HPV (high-risk types by in situ hybridization):Reported as negative      05/11/2016 Imaging    1.  4 mm left lower lobe pulmonary nodule. Attention on follow-up imaging recommended as metastatic disease not excluded. 2. 2.8 cm left thyroid nodule. Thyroid ultrasound recommended to further evaluate. 3. Bilateral renal cysts. 4. Coronary artery and thoracoabdominal aortic atherosclerosis. 5. Ankylosing spondylitis.        05/11/2016 Imaging    CT neck: Surgical clips in the region of the left buccal mucosa, presumably at the previous primary site. Mild scarring in that region measuring about 8 mm. Cannot assess for residual or recurrent disease in that location, but this may simply be scarring. Previous left submandibular resection and left neck node dissection. No abnormal nodes presently. Multinodular goiter. Ankylosing spondylitis.  C1-2 remains a mobile segment.      05/21/2016 Procedure    1. Successful placement of a right internal jugular approach power injectable Port-A-Cath. The Port a catheter is ready for immediate use. 2. Successful fluoroscopic insertion of a 20-French pull-through gastrostomy tube. The gastrostomy may be used immediately for medication administration and may be utilized in 24 hrs for the initiation of feeds.       INTERVAL HISTORY: Please see below for problem oriented charting. He returns for evaluation He has near syncopal event recently and went to the emergency room, discharge He has appointment pending to see cardiologist He denies chest pain or shortness of breath He thought that the cause of his symptoms could be due to fasting for  procedures and his Coreg He has discontinue Coreg He has minimum pain since port placement and feeding tube placement He had mild constipation but has laxative to take at home  REVIEW OF SYSTEMS:   Constitutional: Denies fevers, chills or abnormal weight loss Eyes: Denies blurriness of vision Ears, nose, mouth, throat, and face: Denies mucositis or sore throat Respiratory: Denies cough, dyspnea or wheezes Cardiovascular: Denies palpitation, chest discomfort or lower extremity swelling Skin: Denies abnormal skin rashes Lymphatics: Denies new lymphadenopathy or easy bruising Neurological:Denies numbness, tingling or new weaknesses Behavioral/Psych: Mood is stable, no new changes  All other systems were reviewed with the patient and are negative.  I have reviewed the past medical history, past surgical history, social history and family history with the patient and they are unchanged from previous note.  ALLERGIES:  has No Known Allergies.  MEDICATIONS:  Current Outpatient Prescriptions  Medication Sig Dispense Refill  . aspirin EC 81 MG tablet Take 81 mg by mouth daily.    Marland Kitchen atorvastatin (LIPITOR) 80 MG tablet Take 80 mg by mouth at bedtime.    . cyanocobalamin (,VITAMIN B-12,) 1000 MCG/ML injection INJECT ONCE MONTHLY. 3 mL 1  . isosorbide mononitrate (IMDUR) 60 MG 24 hr tablet Take 90 mg by mouth daily.    Marland Kitchen lidocaine-prilocaine (EMLA) cream Apply to affected area once 30 g 3  . mercaptopurine (PURINETHOL) 50 MG tablet TAKE ONE TABLET ONCE A DAY ON AN EMPTY STOMACH 1 HR BEFORE OR 2HRS AFTER A MEAL 30 tablet 2  . mesalamine (LIALDA) 1.2 g EC tablet Take 2.4 g by mouth daily with breakfast.    . metFORMIN (GLUCOPHAGE) 500 MG tablet Take 1 tablet (500 mg total) by mouth 2 (two) times daily with a meal. Must Keep 05/16/15 appt for future refills 180 tablet 0  . nitroGLYCERIN (NITROSTAT) 0.4 MG SL tablet Place 0.4 mg under the tongue every 5 (five) minutes as needed for chest pain.     Marland Kitchen ondansetron (ZOFRAN) 8 MG tablet Take 1 tablet (8 mg total) by mouth 2 (two) times  daily as needed. Start on the third day after chemotherapy. 30 tablet 1  . ondansetron (ZOFRAN) 8 MG tablet Take 8 mg by mouth every 8 (eight) hours as needed for nausea or vomiting (start on 3 day after chemotherapy).    Marland Kitchen oxyCODONE (OXY IR/ROXICODONE) 5 MG immediate release tablet Take 5 mg by mouth every 6 (six) hours as needed for severe pain.    Marland Kitchen prochlorperazine (COMPAZINE) 10 MG tablet Take 1 tablet (10 mg total) by mouth every 6 (six) hours as needed (Nausea or vomiting). 30 tablet 1   No current facility-administered medications for this visit.     PHYSICAL EXAMINATION: ECOG PERFORMANCE STATUS: 1 - Symptomatic but completely ambulatory  Vitals:   05/23/16 0958  BP: (!) 122/52  Pulse: 64  Resp: 17  Temp: 98.1 F (36.7 C)   Filed Weights   05/23/16 0958  Weight: 185 lb 11.2 oz (84.2 kg)    GENERAL:alert, no distress and comfortable SKIN: skin color, texture, turgor are normal, no rashes or significant lesions EYES: normal, Conjunctiva are pink and non-injected, sclera clear OROPHARYNX:no exudate, no erythema and lips, buccal mucosa, and tongue normal  NECK: supple, thyroid normal size, non-tender, without nodularity LYMPH:  no palpable lymphadenopathy in the cervical, axillary or inguinal LUNGS: clear to auscultation and percussion with normal breathing effort HEART: regular rate & rhythm and no murmurs and no lower extremity edema ABDOMEN:abdomen soft, non-tender and normal bowel sounds.  Feeding tube site looks okay Musculoskeletal:no cyanosis of digits and no clubbing  NEURO: alert & oriented x 3 with fluent speech, no focal motor/sensory deficits  LABORATORY DATA:  I have reviewed the data as listed    Component Value Date/Time   NA 135 05/22/2016 1020   K 4.3 05/22/2016 1020   CL 102 05/22/2016 1020   CO2 26 05/22/2016 1020   GLUCOSE 170 (H) 05/22/2016 1020   GLUCOSE 98  12/12/2005 0729   BUN 12 05/22/2016 1020   CREATININE 0.90 05/22/2016 1020   CREATININE 0.96 12/03/2014 0833   CALCIUM 8.6 (L) 05/22/2016 1020   PROT 6.3 (L) 05/22/2016 1019   ALBUMIN 3.6 05/22/2016 1019   AST 19 05/22/2016 1019   ALT 12 (L) 05/22/2016 1019   ALKPHOS 40 05/22/2016 1019   BILITOT 1.5 (H) 05/22/2016 1019   GFRNONAA >60 05/22/2016 1020   GFRAA >60 05/22/2016 1020    No results found for: SPEP, UPEP  Lab Results  Component Value Date   WBC 8.2 05/22/2016   NEUTROABS 6.9 05/21/2016   HGB 10.9 (L) 05/22/2016   HCT 32.9 (L) 05/22/2016   MCV 106.8 (H) 05/22/2016   PLT 148 (L) 05/22/2016      Chemistry      Component Value Date/Time   NA 135 05/22/2016 1020   K 4.3 05/22/2016 1020   CL 102 05/22/2016 1020   CO2 26 05/22/2016 1020   BUN 12 05/22/2016 1020   CREATININE 0.90 05/22/2016 1020   CREATININE 0.96 12/03/2014 0833      Component Value Date/Time   CALCIUM 8.6 (L) 05/22/2016 1020   ALKPHOS 40 05/22/2016 1019   AST 19 05/22/2016 1019   ALT 12 (L) 05/22/2016 1019   BILITOT 1.5 (H) 05/22/2016 1019       RADIOGRAPHIC STUDIES: I have personally reviewed the radiological images as listed and agreed with the findings in the report. Dg Chest 2 View  Result Date: 05/22/2016 CLINICAL DATA:  Syncope. EXAM: CHEST  2 VIEW COMPARISON:  Radiograph of May 21, 2016. FINDINGS: Stable cardiomediastinal silhouette. Status post coronary artery bypass graft. No pneumothorax or pleural effusion is noted. No acute pulmonary disease is noted. Right internal jugular Port-A-Cath is noted with distal tip in expected position of cavoatrial junction. Bony thorax is unremarkable. IMPRESSION: No active cardiopulmonary disease. Electronically Signed   By: Lupita Raider, M.D.   On: 05/22/2016 11:59   Ct Soft Tissue Neck W Contrast  Result Date: 05/11/2016 CLINICAL DATA:  Carcinoma of the left buccal mucosa diagnosed in December of 2017 EXAM: CT NECK WITH CONTRAST TECHNIQUE:  Multidetector CT imaging of the neck was performed using the standard protocol following the bolus administration of intravenous contrast. CONTRAST:  ISOVUE-300 IOPAMIDOL (ISOVUE-300) INJECTION 61% COMPARISON:  None. FINDINGS: Pharynx and larynx: Surgical clips in the region of the left buccal mucosa. Mild scarring in that region measuring about 8 mm. No definite actual mass lesion. No other focal mucosal finding. Larynx appears normal. Salivary glands: Left parotid slightly atrophic. Right parotid normal. Right submandibular normal. Left submandibular previously resected. Thyroid: Multinodular goiter with largest nodule inferior on the left measuring 3.1 cm. Lymph nodes: No enlarged or low-density nodes on either side of the neck. Calcified 4 mm supraclavicular node on the left. Surgical clips related to previous level 2 and level 3 node resections. Vascular: Bilateral carotid calcification. Limited intracranial: Normal except for vascular calcification. Visualized orbits: Normal Mastoids and visualized paranasal sinuses: Clear/normal Skeleton: Ankylosis of the spine consistent with ankylosing spondylitis. Ankylosis of the occiput put and C1. 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 Upper chest: See results of chest CT. Other: None IMPRESSION: Surgical clips in the region of the left buccal mucosa, presumably at the previous primary site. Mild scarring in that region measuring about 8 mm. Cannot assess for residual or recurrent disease in that location, but this may simply be scarring. Previous left submandibular resection and left neck node dissection. No abnormal nodes presently. Multinodular goiter. Ankylosing spondylitis.  C1-2 remains a mobile segment. Electronically Signed   By: Paulina Fusi M.D.   On: 05/11/2016 09:17   Ct Chest W Contrast  Result Date: 05/11/2016 CLINICAL DATA:  Head and neck cancer. EXAM: CT CHEST, ABDOMEN, WITH CONTRAST TECHNIQUE: Multidetector CT  imaging of the chest, abdomen and pelvis was performed following the standard protocol during bolus administration of intravenous contrast. CONTRAST:  ISOVUE-300 IOPAMIDOL (ISOVUE-300) INJECTION 61% COMPARISON:  None. FINDINGS: CT CHEST FINDINGS Cardiovascular: The heart size is normal. No pericardial effusion. Patient is status post CABG. Atherosclerotic calcification is noted in the wall of the thoracic aorta. Mediastinum/Nodes: No mediastinal lymphadenopathy. There is no hilar lymphadenopathy. The esophagus has normal imaging features. 2.8 cm left thyroid nodule associated with small right thyroid nodules. There is no axillary lymphadenopathy. Lungs/Pleura: 4 mm left lower lobe pulmonary nodule seen image 98 series 7. No focal airspace consolidation. No pulmonary edema or pleural effusion. Musculoskeletal: Bone windows reveal no worrisome lytic or sclerotic osseous lesions. CT ABDOMEN FINDINGS Hepatobiliary: No focal abnormality within the liver parenchyma. 14 mm calcified gallstone. No intrahepatic or extrahepatic biliary dilation. Pancreas: No focal mass lesion. No dilatation of the main duct. No intraparenchymal cyst. No peripancreatic edema. Spleen: No splenomegaly. No focal mass lesion. Adrenals/Urinary Tract: No adrenal nodule or mass. 5.6 cm exophytic cyst right kidney. 4.2 cm left renal cyst associated with another smaller 2.7 cm cyst lower pole left kidney. Small central sinus cysts noted in the kidneys bilaterally Stomach/Bowel: Stomach is nondistended. No gastric wall thickening. No  evidence of outlet obstruction. Duodenum is normally positioned as is the ligament of Treitz. Visualize small bowel loops are unremarkable. Abdominal segments of the colon are unremarkable. Vascular/Lymphatic: There is abdominal aortic atherosclerosis without aneurysm. There is no gastrohepatic or hepatoduodenal ligament lymphadenopathy. No intraperitoneal or retroperitoneal lymphadenopathy. Other: No intraperitoneal  free fluid. Musculoskeletal: Bone windows reveal no worrisome lytic or sclerotic osseous lesions. Changes of ankylosing spondylitis noted. IMPRESSION: 1. 4 mm left lower lobe pulmonary nodule. Attention on follow-up imaging recommended as metastatic disease not excluded. 2. 2.8 cm left thyroid nodule. Thyroid ultrasound recommended to further evaluate. 3. Bilateral renal cysts. 4. Coronary artery and thoracoabdominal aortic atherosclerosis. 5. Ankylosing spondylitis. Electronically Signed   By: Misty Stanley M.D.   On: 05/11/2016 09:32   Ct Abdomen W Contrast  Result Date: 05/11/2016 CLINICAL DATA:  Head and neck cancer. EXAM: CT CHEST, ABDOMEN, WITH CONTRAST TECHNIQUE: Multidetector CT imaging of the chest, abdomen and pelvis was performed following the standard protocol during bolus administration of intravenous contrast. CONTRAST:  166m ISOVUE-300 IOPAMIDOL (ISOVUE-300) INJECTION 61% COMPARISON:  None. FINDINGS: CT CHEST FINDINGS Cardiovascular: The heart size is normal. No pericardial effusion. Patient is status post CABG. Atherosclerotic calcification is noted in the wall of the thoracic aorta. Mediastinum/Nodes: No mediastinal lymphadenopathy. There is no hilar lymphadenopathy. The esophagus has normal imaging features. 2.8 cm left thyroid nodule associated with small right thyroid nodules. There is no axillary lymphadenopathy. Lungs/Pleura: 4 mm left lower lobe pulmonary nodule seen image 98 series 7. No focal airspace consolidation. No pulmonary edema or pleural effusion. Musculoskeletal: Bone windows reveal no worrisome lytic or sclerotic osseous lesions. CT ABDOMEN FINDINGS Hepatobiliary: No focal abnormality within the liver parenchyma. 14 mm calcified gallstone. No intrahepatic or extrahepatic biliary dilation. Pancreas: No focal mass lesion. No dilatation of the main duct. No intraparenchymal cyst. No peripancreatic edema. Spleen: No splenomegaly. No focal mass lesion. Adrenals/Urinary Tract: No  adrenal nodule or mass. 5.6 cm exophytic cyst right kidney. 4.2 cm left renal cyst associated with another smaller 2.7 cm cyst lower pole left kidney. Small central sinus cysts noted in the kidneys bilaterally Stomach/Bowel: Stomach is nondistended. No gastric wall thickening. No evidence of outlet obstruction. Duodenum is normally positioned as is the ligament of Treitz. Visualize small bowel loops are unremarkable. Abdominal segments of the colon are unremarkable. Vascular/Lymphatic: There is abdominal aortic atherosclerosis without aneurysm. There is no gastrohepatic or hepatoduodenal ligament lymphadenopathy. No intraperitoneal or retroperitoneal lymphadenopathy. Other: No intraperitoneal free fluid. Musculoskeletal: Bone windows reveal no worrisome lytic or sclerotic osseous lesions. Changes of ankylosing spondylitis noted. IMPRESSION: 1. 4 mm left lower lobe pulmonary nodule. Attention on follow-up imaging recommended as metastatic disease not excluded. 2. 2.8 cm left thyroid nodule. Thyroid ultrasound recommended to further evaluate. 3. Bilateral renal cysts. 4. Coronary artery and thoracoabdominal aortic atherosclerosis. 5. Ankylosing spondylitis. Electronically Signed   By: EMisty StanleyM.D.   On: 05/11/2016 09:32   Ct Abdomen Pelvis W Contrast  Result Date: 05/21/2016 CLINICAL DATA:  Acute onset of shortness of breath and generalized abdominal pain. Recent G-tube placement. Initial encounter. EXAM: CT ABDOMEN AND PELVIS WITH CONTRAST TECHNIQUE: Multidetector CT imaging of the abdomen and pelvis was performed using the standard protocol following bolus administration of intravenous contrast. CONTRAST:  1045mISOVUE-300 IOPAMIDOL (ISOVUE-300) INJECTION 61% COMPARISON:  CT of the abdomen and pelvis performed 05/11/2016 FINDINGS: Lower chest: Diffuse coronary artery calcifications are seen. The patient is status post median sternotomy. Bibasilar atelectasis or scarring is noted. Hepatobiliary: The liver  is unremarkable in appearance. A stone is noted dependently within the gallbladder. The gallbladder is otherwise unremarkable. The common bile duct remains normal in caliber. Pancreas: The pancreas is within normal limits. Spleen: The spleen is unremarkable in appearance. Adrenals/Urinary Tract: The adrenal glands are unremarkable in appearance. Bilateral renal cysts are noted. Nonspecific perinephric stranding is noted bilaterally. There is no evidence of hydronephrosis. No renal or ureteral stones are identified. Stomach/Bowel: The patient's G-tube is noted at the body of the stomach. A small amount of free air is seen tracking within the abdomen, reflecting recent surgery. The small bowel is within normal limits. The appendix is not visualized; there is no evidence for appendicitis. There is wall thickening along the distal sigmoid colon and rectum, raising question for mild proctitis. The colon is otherwise unremarkable in appearance. Vascular/Lymphatic: Scattered calcification is seen along the abdominal aorta and its branches. The abdominal aorta is otherwise grossly unremarkable. The inferior vena cava is grossly unremarkable. No retroperitoneal lymphadenopathy is seen. No pelvic sidewall lymphadenopathy is identified. Reproductive: The bladder is mildly distended. Mild bladder wall thickening could reflect cystitis. The prostate is enlarged, measuring 5.6 cm in transverse dimension, with mild heterogeneity. Other: A small left inguinal hernia is noted, containing only fat. Musculoskeletal: No acute osseous abnormalities are identified. Anterior bridging osteophytes are noted along the lower thoracic and upper lumbar spine. Facet disease is noted along the lumbar spine. The visualized musculature is unremarkable in appearance. IMPRESSION: 1. Wall thickening along the distal sigmoid colon rectum raises question for mild proctitis. 2. Mild bladder wall thickening could reflect cystitis. 3. Postoperative free  air within the abdomen, given recent surgery. G-tube noted ending at the body of the stomach. 4. Diffuse coronary artery calcifications seen. 5. Cholelithiasis.  Gallbladder otherwise unremarkable. 6. Bilateral renal cysts noted. 7. Scattered aortic atherosclerosis. 8. Enlarged prostate, with mild heterogeneity. Would correlate with PSA. 9. Small left inguinal hernia, containing only fat. 10. Anterior bridging osteophytes along the lower thoracic and upper lumbar spine. Electronically Signed   By: Garald Balding M.D.   On: 05/21/2016 19:21   Ir Gastrostomy Tube  Result Date: 05/21/2016 INDICATION: History of head neck cancer, in need of durable intravenous access for chemotherapy administration. In need of enteric access for enteric nutrition supplementation prior to initiation of chemoradiation. EXAM: 1. IMPLANTED PORT A CATH PLACEMENT WITH ULTRASOUND AND FLUOROSCOPIC GUIDANCE 2. FLUOROSCOPIC GUIDED PERCUTANEOUS GASTROSTOMY TUBE PLACEMENT COMPARISON:  CT the chest, abdomen pelvis - 05/12/2018 MEDICATIONS: Glucagon 1 mg IV; Ancef 2 g IV; The antibiotic was administered within an appropriate time interval prior to skin puncture. ANESTHESIA/SEDATION: Moderate (conscious) sedation was employed during this procedure. A total of Versed 6 mg and Fentanyl 100 mcg was administered intravenously. Moderate Sedation Time: 45 minutes. The patient's level of consciousness and vital signs were monitored continuously by radiology nursing throughout the procedure under my direct supervision. CONTRAST:  20 - cc Isovue 300, administered into the stomach lumen. FLUOROSCOPY TIME:  A total of 1 minute, 54 seconds (38.2 mGy) fluoroscopy time was utilized for both procedures. COMPLICATIONS: None immediate. PROCEDURE: The procedures, risks, benefits, and alternatives were explained to the patient. Questions regarding the procedure were encouraged and answered. The patient understands and consents to both procedures. Attention was  initially paid towards placement of the [facility] a Catheter. The right neck and chest were prepped with chlorhexidine in a sterile fashion, and a sterile drape was applied covering the operative field. Maximum barrier sterile technique with sterile gowns and gloves were  used for the procedure. A timeout was performed prior to the initiation of the procedure. Local anesthesia was provided with 1% lidocaine with epinephrine. After creating a small venotomy incision, a micropuncture kit was utilized to access the internal jugular vein under direct, real-time ultrasound guidance. Ultrasound image documentation was performed. The microwire was kinked to measure appropriate catheter length. A subcutaneous port pocket was then created along the upper chest wall utilizing a combination of Filla and blunt dissection. The pocket was irrigated with sterile saline. A single lumen thin power injectable port was chosen for placement. The 8 Fr catheter was tunneled from the port pocket site to the venotomy incision. The port was placed in the pocket. The external catheter was trimmed to appropriate length. At the venotomy, an 8 Fr peel-away sheath was placed over a guidewire under fluoroscopic guidance. The catheter was then placed through the sheath and the sheath was removed. Final catheter positioning was confirmed and documented with a fluoroscopic spot radiograph. The port was accessed with a Huber needle, aspirated and flushed with heparinized saline. The venotomy site was closed with an interrupted 4-0 Vicryl suture. The port pocket incision was closed with interrupted 2-0 Vicryl suture and the skin was opposed with a running subcuticular 4-0 Vicryl suture. Dermabond and Steri-strips were applied to both incisions. Dressings were placed. ____________________________________________ Next, attention was paid towards placement of the gastrostomy tube. The left upper quadrant was sterilely prepped and draped. An oral gastric  catheter was inserted into the stomach under fluoroscopy. The existing nasogastric feeding tube was removed. The left costal margin and air / barium opacified transverse colon were identified and avoided. Air was injected into the stomach for insufflation and visualization under fluoroscopy. Under sterile conditions a 17 gauge trocar needle was utilized to access the stomach percutaneously beneath the left subcostal margin after the overlying soft tissues were anesthetized with 1% Lidocaine with epinephrine. Needle position was confirmed within the stomach with aspiration of air and injection of small amount of contrast. A single T tack was deployed for gastropexy. Over an Amplatz guide wire, a 9-French sheath was inserted into the stomach. A snare device was utilized to capture the oral gastric catheter. The snare device was pulled retrograde from the stomach up the esophagus and out the oropharynx. The 20-French pull-through gastrostomy was connected to the snare device and pulled antegrade through the oropharynx down the esophagus into the stomach and then through the percutaneous tract external to the patient. The gastrostomy was assembled externally. Contrast injection confirms position in the stomach. Several spot radiographic images were obtained in various obliquities for documentation. Dressings were placed. The patient tolerated both above procedures well without immediate post procedural complication. FINDINGS: After catheter placement, the tip lies within the superior cavoatrial junction. The catheter aspirates and flushes normally and is ready for immediate use. After successful fluoroscopic guided placement, the gastrostomy tube is appropriately positioned with internal disc against the ventral aspect of the gastric lumen. IMPRESSION: 1. Successful placement of a right internal jugular approach power injectable Port-A-Cath. The Port a catheter is ready for immediate use. 2. Successful fluoroscopic  insertion of a 20-French pull-through gastrostomy tube. The gastrostomy may be used immediately for medication administration and may be utilized in 24 hrs for the initiation of feeds. Electronically Signed   By: Sandi Mariscal M.D.   On: 05/21/2016 12:48   Ir US Guide Vasc Access Right  Result Date: 05/21/2016 INDICATION: History of head neck cancer, in need of durable intravenous access  for chemotherapy administration. In need of enteric access for enteric nutrition supplementation prior to initiation of chemoradiation. EXAM: 1. IMPLANTED PORT A CATH PLACEMENT WITH ULTRASOUND AND FLUOROSCOPIC GUIDANCE 2. FLUOROSCOPIC GUIDED PERCUTANEOUS GASTROSTOMY TUBE PLACEMENT COMPARISON:  CT the chest, abdomen pelvis - 05/12/2018 MEDICATIONS: Glucagon 1 mg IV; Ancef 2 g IV; The antibiotic was administered within an appropriate time interval prior to skin puncture. ANESTHESIA/SEDATION: Moderate (conscious) sedation was employed during this procedure. A total of Versed 6 mg and Fentanyl 100 mcg was administered intravenously. Moderate Sedation Time: 45 minutes. The patient's level of consciousness and vital signs were monitored continuously by radiology nursing throughout the procedure under my direct supervision. CONTRAST:  20 - cc Isovue 300, administered into the stomach lumen. FLUOROSCOPY TIME:  A total of 1 minute, 54 seconds (38.2 mGy) fluoroscopy time was utilized for both procedures. COMPLICATIONS: None immediate. PROCEDURE: The procedures, risks, benefits, and alternatives were explained to the patient. Questions regarding the procedure were encouraged and answered. The patient understands and consents to both procedures. Attention was initially paid towards placement of the [facility] a Catheter. The right neck and chest were prepped with chlorhexidine in a sterile fashion, and a sterile drape was applied covering the operative field. Maximum barrier sterile technique with sterile gowns and gloves were used for the  procedure. A timeout was performed prior to the initiation of the procedure. Local anesthesia was provided with 1% lidocaine with epinephrine. After creating a small venotomy incision, a micropuncture kit was utilized to access the internal jugular vein under direct, real-time ultrasound guidance. Ultrasound image documentation was performed. The microwire was kinked to measure appropriate catheter length. A subcutaneous port pocket was then created along the upper chest wall utilizing a combination of Rey and blunt dissection. The pocket was irrigated with sterile saline. A single lumen thin power injectable port was chosen for placement. The 8 Fr catheter was tunneled from the port pocket site to the venotomy incision. The port was placed in the pocket. The external catheter was trimmed to appropriate length. At the venotomy, an 8 Fr peel-away sheath was placed over a guidewire under fluoroscopic guidance. The catheter was then placed through the sheath and the sheath was removed. Final catheter positioning was confirmed and documented with a fluoroscopic spot radiograph. The port was accessed with a Huber needle, aspirated and flushed with heparinized saline. The venotomy site was closed with an interrupted 4-0 Vicryl suture. The port pocket incision was closed with interrupted 2-0 Vicryl suture and the skin was opposed with a running subcuticular 4-0 Vicryl suture. Dermabond and Steri-strips were applied to both incisions. Dressings were placed. ____________________________________________ Next, attention was paid towards placement of the gastrostomy tube. The left upper quadrant was sterilely prepped and draped. An oral gastric catheter was inserted into the stomach under fluoroscopy. The existing nasogastric feeding tube was removed. The left costal margin and air / barium opacified transverse colon were identified and avoided. Air was injected into the stomach for insufflation and visualization under  fluoroscopy. Under sterile conditions a 17 gauge trocar needle was utilized to access the stomach percutaneously beneath the left subcostal margin after the overlying soft tissues were anesthetized with 1% Lidocaine with epinephrine. Needle position was confirmed within the stomach with aspiration of air and injection of small amount of contrast. A single T tack was deployed for gastropexy. Over an Amplatz guide wire, a 9-French sheath was inserted into the stomach. A snare device was utilized to capture the oral gastric catheter. The snare  device was pulled retrograde from the stomach up the esophagus and out the oropharynx. The 20-French pull-through gastrostomy was connected to the snare device and pulled antegrade through the oropharynx down the esophagus into the stomach and then through the percutaneous tract external to the patient. The gastrostomy was assembled externally. Contrast injection confirms position in the stomach. Several spot radiographic images were obtained in various obliquities for documentation. Dressings were placed. The patient tolerated both above procedures well without immediate post procedural complication. FINDINGS: After catheter placement, the tip lies within the superior cavoatrial junction. The catheter aspirates and flushes normally and is ready for immediate use. After successful fluoroscopic guided placement, the gastrostomy tube is appropriately positioned with internal disc against the ventral aspect of the gastric lumen. IMPRESSION: 1. Successful placement of a right internal jugular approach power injectable Port-A-Cath. The Port a catheter is ready for immediate use. 2. Successful fluoroscopic insertion of a 20-French pull-through gastrostomy tube. The gastrostomy may be used immediately for medication administration and may be utilized in 24 hrs for the initiation of feeds. Electronically Signed   By: Sandi Mariscal M.D.   On: 05/21/2016 12:48   Dg Chest Port 1  View  Result Date: 05/21/2016 CLINICAL DATA:  Shortness of Breath EXAM: PORTABLE CHEST 1 VIEW COMPARISON:  05/11/2016 FINDINGS: Cardiac shadow is within normal limits. Postoperative changes are seen. Right chest wall port is noted. No focal infiltrate or sizable effusion is seen. No acute bony abnormality is noted. IMPRESSION: No acute abnormality seen. Electronically Signed   By: [name] M.D.   On: 05/21/2016 17:17   Dg Abd Portable 1 View  Result Date: 05/21/2016 CLINICAL DATA:  Abdominal distension and recent G-tube placement EXAM: PORTABLE ABDOMEN - 1 VIEW COMPARISON:  None. FINDINGS: Scattered large and small bowel gas is noted. The small bowel loops are mildly prominent likely representing an ileus. Gastrostomy catheter is noted just to the left of the midline. No other focal abnormality is seen. IMPRESSION: Changes most consistent with a small-bowel ileus. Although not mentioned in the body of the report there may be a small amount of free intraperitoneal air likely related to the recent gastrostomy placement. Electronically Signed   By: [name] M.D.   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 In need of enteric access for enteric nutrition supplementation prior to initiation of chemoradiation. EXAM: 1. IMPLANTED PORT A CATH PLACEMENT WITH ULTRASOUND AND FLUOROSCOPIC GUIDANCE 2. FLUOROSCOPIC GUIDED PERCUTANEOUS GASTROSTOMY TUBE PLACEMENT COMPARISON:  CT the chest, abdomen pelvis - 05/12/2018 MEDICATIONS: Glucagon 1 mg IV; Ancef 2 g IV; The antibiotic was administered within an appropriate time interval prior to skin puncture. ANESTHESIA/SEDATION: Moderate (conscious) sedation was employed during this procedure. A total of Versed 6 mg and Fentanyl 100 mcg was administered intravenously. Moderate Sedation Time: 45 minutes. The patient's  level of consciousness and vital signs were monitored continuously by radiology nursing throughout the procedure under my direct supervision. CONTRAST:  20 - cc Isovue 300, administered into the stomach lumen. FLUOROSCOPY TIME:  A total of 1 minute, 54 seconds (38.2 mGy) fluoroscopy time was utilized for both procedures. COMPLICATIONS: None immediate. PROCEDURE: The procedures, risks, benefits, and alternatives were explained to the patient. Questions regarding the procedure were encouraged and answered. The patient understands and consents to both procedures. Attention was initially paid towards placement of the [facility] a Catheter.  The right neck and chest were prepped with chlorhexidine in a sterile fashion, and a sterile drape was applied covering the operative field. Maximum barrier sterile technique with sterile gowns and gloves were used for the procedure. A timeout was performed prior to the initiation of the procedure. Local anesthesia was provided with 1% lidocaine with epinephrine. After creating a small venotomy incision, a micropuncture kit was utilized to access the internal jugular vein under direct, real-time ultrasound guidance. Ultrasound image documentation was performed. The microwire was kinked to measure appropriate catheter length. A subcutaneous port pocket was then created along the upper chest wall utilizing a combination of Ruud and blunt dissection. The pocket was irrigated with sterile saline. A single lumen thin power injectable port was chosen for placement. The 8 Fr catheter was tunneled from the port pocket site to the venotomy incision. The port was placed in the pocket. The external catheter was trimmed to appropriate length. At the venotomy, an 8 Fr peel-away sheath was placed over a guidewire under fluoroscopic guidance. The catheter was then placed through the sheath and the sheath was removed. Final catheter positioning was confirmed and documented with a fluoroscopic spot  radiograph. The port was accessed with a Huber needle, aspirated and flushed with heparinized saline. The venotomy site was closed with an interrupted 4-0 Vicryl suture. The port pocket incision was closed with interrupted 2-0 Vicryl suture and the skin was opposed with a running subcuticular 4-0 Vicryl suture. Dermabond and Steri-strips were applied to both incisions. Dressings were placed. ____________________________________________ Next, attention was paid towards placement of the gastrostomy tube. The left upper quadrant was sterilely prepped and draped. An oral gastric catheter was inserted into the stomach under fluoroscopy. The existing nasogastric feeding tube was removed. The left costal margin and air / barium opacified transverse colon were identified and avoided. Air was injected into the stomach for insufflation and visualization under fluoroscopy. Under sterile conditions a 17 gauge trocar needle was utilized to access the stomach percutaneously beneath the left subcostal margin after the overlying soft tissues were anesthetized with 1% Lidocaine with epinephrine. Needle position was confirmed within the stomach with aspiration of air and injection of small amount of contrast. A single T tack was deployed for gastropexy. Over an Amplatz guide wire, a 9-French sheath was inserted into the stomach. A snare device was utilized to capture the oral gastric catheter. The snare device was pulled retrograde from the stomach up the esophagus and out the oropharynx. The 20-French pull-through gastrostomy was connected to the snare device and pulled antegrade through the oropharynx down the esophagus into the stomach and then through the percutaneous tract external to the patient. The gastrostomy was assembled externally. Contrast injection confirms position in the stomach. Several spot radiographic images were obtained in various obliquities for documentation. Dressings were placed. The patient tolerated both  above procedures well without immediate post procedural complication. FINDINGS: After catheter placement, the tip lies within the superior cavoatrial junction. The catheter aspirates and flushes normally and is ready for immediate use. After successful fluoroscopic guided placement, the gastrostomy tube is appropriately positioned with internal disc against the ventral aspect of the gastric lumen. IMPRESSION: 1. Successful placement of a right internal jugular approach power injectable Port-A-Cath. The Port a catheter is ready for immediate use. 2. Successful fluoroscopic insertion of a 20-French pull-through gastrostomy tube. The gastrostomy may be used immediately for medication administration and may be utilized in 24 hrs for the initiation of feeds. Electronically Signed   By:  Sandi Mariscal M.D.   On: 05/21/2016 12:48    ASSESSMENT & PLAN:  Buccal mucosa squamous cell carcinoma (HCC) We discussed the role of adjuvant chemotherapy. The intention of treatment is curative Given his age and other comorbidities, I plan to reduce a dose of chemotherapy by 25%, keep at 30 mg/m weekly He will get blood draw weekly and see me weekly for supportive care We discussed the role of chemotherapy. The intent is of curative intent.  We discussed some of the risks, benefits, side-effects of cisplatin  Some of the short term side-effects included, though not limited to, including weight loss, life threatening infections, risk of allergic reactions, need for transfusions of blood products, nausea, vomiting, change in bowel habits, loss of hair, admission to hospital for various reasons, and risks of death.   Long term side-effects are also discussed including risks of infertility, permanent damage to nerve function, hearing loss, chronic fatigue, kidney damage with possibility needing hemodialysis, and rare secondary malignancy including bone marrow disorders.  The patient is aware that the response rates discussed  earlier is not guaranteed.  After a long discussion, patient made an informed decision to proceed with the prescribed plan of care.   Patient education material was dispensed. I reminded him to reschedule hearing test    Coronary atherosclerosis of native coronary artery The patient has near syncopal events recently He had discontinue Coreg Due to risk of kidney injury, I recommend he hold Cozaar We will monitor his blood pressure carefully He has appointment to see cardiologist for further evaluation and management   Orders Placed This Encounter  Procedures  . Magnesium    Standing Status:   Standing    Number of Occurrences:   22    Standing Expiration Date:   05/23/2017  . Comprehensive metabolic panel    Standing Status:   Standing    Number of Occurrences:   22    Standing Expiration Date:   05/23/2017  . CBC with Differential    Standing Status:   Standing    Number of Occurrences:   20    Standing Expiration Date:   05/24/2017   All questions were answered. The patient knows to call the clinic with any problems, questions or concerns. No barriers to learning was detected. I spent 25 minutes counseling the patient face to face. The total time spent in the appointment was 40 minutes and more than 50% was on counseling and review of test results     [name], MD 05/23/2016 3:22 PM

## 2016-05-23 NOTE — Patient Instructions (Addendum)
Medication Instructions: Your physician has recommended you make the following change in your medication:  1. STOP Carvedilol 2. STOP Losartan  Labwork: No new orders.   Testing/Procedures: Your physician has requested that you have an echocardiogram. Echocardiography is a painless test that uses sound waves to create images of your heart. It provides your doctor with information about the size and shape of your heart and how well your heart's chambers and valves are working. This procedure takes approximately one hour. There are no restrictions for this procedure.  Follow-Up: Your physician wants you to follow-up in: 6 MONTHS with Dr Burt Knack.  You will receive a reminder letter in the mail two months in advance. If you don't receive a letter, please call our office to schedule the follow-up appointment.   Any Other Special Instructions Will Be Listed Below (If Applicable).     If you need a refill on your cardiac medications before your next appointment, please call your pharmacy.

## 2016-05-23 NOTE — Assessment & Plan Note (Signed)
We discussed the role of adjuvant chemotherapy. The intention of treatment is curative Given his age and other comorbidities, I plan to reduce a dose of chemotherapy by 25%, keep at 30 mg/m weekly He will get blood draw weekly and see me weekly for supportive care We discussed the role of chemotherapy. The intent is of curative intent.  We discussed some of the risks, benefits, side-effects of cisplatin  Some of the short term side-effects included, though not limited to, including weight loss, life threatening infections, risk of allergic reactions, need for transfusions of blood products, nausea, vomiting, change in bowel habits, loss of hair, admission to hospital for various reasons, and risks of death.   Long term side-effects are also discussed including risks of infertility, permanent damage to nerve function, hearing loss, chronic fatigue, kidney damage with possibility needing hemodialysis, and rare secondary malignancy including bone marrow disorders.  The patient is aware that the response rates discussed earlier is not guaranteed.  After a long discussion, patient made an informed decision to proceed with the prescribed plan of care.   Patient education material was dispensed. I reminded him to reschedule hearing test

## 2016-05-23 NOTE — Assessment & Plan Note (Signed)
The patient has near syncopal events recently He had discontinue Coreg Due to risk of kidney injury, I recommend he hold Cozaar We will monitor his blood pressure carefully He has appointment to see cardiologist for further evaluation and management

## 2016-05-23 NOTE — Progress Notes (Signed)
Cardiology Office Note Date:  05/23/2016   ID:  Andres Smith, DOB 1940-12-15, MRN [id number]  PCP:  Janith Lima, MD  Cardiologist:  Sherren Mocha, MD    Chief Complaint  Patient presents with  . Follow-up    from ER visit     History of Present Illness: Andres Smith is a 76 y.o. male who presents for evaluation of dizziness/bradycardia.  He's been followed regularly for coronary artery disease with previous CABG, hypertension, hyperlipidemia, and type 2 diabetes. He has a history of CCS class II angina without recent chest pain. He had a J tube placed 5/21 as he is about to begin radiation and chemotherapy for treatment of buccal mucosa squamous cell carcinoma. Yesterday he developed lightheadedness, diaphoresis, and weakness. He felt like he was close to passing out. He was sent to the ER and was noted to have bradycardia. Felt to have had a vagal episode. He has continued on his antihypertensive medications and has lost 20 pounds since his cancer diagnosis.   The patient is here with his wife today. He is feeling better. He has chronic leg edema but no acute complaints today. Lightheadedness/dizziness has completely resolved. No recent chest pain. He hasn't been very active.    Past Medical History:  Diagnosis Date  . Ankylosing spondylitis ([place])   . Blood transfusion    hx of transfusion without reaction  . Bradycardia   . Cancer ([place]) 03/2016   oral  . Cholelithiasis   . Coronary artery disease    a. s/p CABG x 6 in 1997 (LIMA->LAD, VG->RI ->OM1->OM2, VG->PDA->PLV // b. 05/2011 Cath:  patent grafs, native prox rca and d2 dzs  ->med rx. // c. Myoview 1/18: not gated, large inferolateral scar, no ischemia; Intermediate Risk (IL scar old - on prior studies >> med rx)  . Crohn's colitis ([place]) 12/01/1997  . Diabetes mellitus   . Dysphagia   . Fatty liver   . GERD (gastroesophageal reflux disease)   . Heart block   . Hiatal hernia 12/03/2008  . History of skin cancer   . Hx  of echocardiogram    Echo 5/14:  Mild LVH, EF 55-60%, NL diast fxn, mild LAE   . Hyperlipidemia   . Hypertension   . Kidney stones   . Neck pain   . Pneumonia    hx of PNA  . 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 cancer removal  2014   removed from left outter leg  . COLONOSCOPY  2012  . CORONARY ARTERY BYPASS GRAFT  04/19/1995   x7  . EXCISION ORAL TUMOR Left    left jaw and lymph node  . HERNIA REPAIR  1989  . IR FLUORO GUIDE PORT INSERTION RIGHT  05/21/2016  . IR GASTROSTOMY TUBE MOD SED  05/21/2016  . IR US GUIDE VASC ACCESS RIGHT  05/21/2016  . LEFT HEART CATHETERIZATION WITH CORONARY/GRAFT ANGIOGRAM N/A 05/24/2011   Procedure: LEFT HEART CATHETERIZATION WITH Beatrix Fetters;  Surgeon: Larey Dresser, MD;  Location: [facility] CATH LAB;  Service: Cardiovascular;  Laterality: N/A;  . MOHS SURGERY     X 2 off chin and nose  . TONSILLECTOMY AND ADENOIDECTOMY  1944  . TRIGGER FINGER RELEASE    . UMBILICAL HERNIA REPAIR      Current Outpatient Prescriptions  Medication Sig Dispense Refill  . aspirin EC 81 MG tablet Take 81 mg by mouth daily.    Marland Kitchen  atorvastatin (LIPITOR) 80 MG tablet Take 80 mg by mouth at bedtime.    . cyanocobalamin (,VITAMIN B-12,) 1000 MCG/ML injection INJECT 1ML ONCE MONTHLY. 3 mL 1  . isosorbide mononitrate (IMDUR) 60 MG 24 hr tablet Take 90 mg by mouth daily.    Marland Kitchen lidocaine-prilocaine (EMLA) cream Apply to affected area once 30 g 3  . mercaptopurine (PURINETHOL) 50 MG tablet TAKE ONE TABLET ONCE A DAY ON AN EMPTY STOMACH 1 HR BEFORE OR 2HRS AFTER A MEAL 30 tablet 2  . mesalamine (LIALDA) 1.2 g EC tablet Take 2.4 g by mouth daily with breakfast.    . metFORMIN (GLUCOPHAGE) 500 MG tablet Take 1 tablet (500 mg total) by mouth 2 (two) times daily with a meal. Must Keep 05/16/15 appt for future refills 180 tablet 0  . nitroGLYCERIN (NITROSTAT) 0.4 MG SL tablet Place 0.4 mg under the  tongue every 5 (five) minutes as needed for chest pain.    Marland Kitchen ondansetron (ZOFRAN) 8 MG tablet Take 1 tablet (8 mg total) by mouth 2 (two) times daily as needed. Start on the third day after chemotherapy. 30 tablet 1  . ondansetron (ZOFRAN) 8 MG tablet Take 8 mg by mouth every 8 (eight) hours as needed for nausea or vomiting (start on 3 day after chemotherapy).    Marland Kitchen oxyCODONE (OXY IR/ROXICODONE) 5 MG immediate release tablet Take 5 mg by mouth every 6 (six) hours as needed for severe pain.    Marland Kitchen prochlorperazine (COMPAZINE) 10 MG tablet Take 1 tablet (10 mg total) by mouth every 6 (six) hours as needed (Nausea or vomiting). 30 tablet 1   No current facility-administered medications for this visit.     Allergies:   Patient has no known allergies.   Social History:  The patient  reports that he quit smoking about 33 years ago. He quit smokeless tobacco use about 6 years ago. His smokeless tobacco use included Chew. He reports that he drinks alcohol. He reports that he does not use drugs.   Family History:  The patient's  family history includes Arthritis in his mother; Cancer in his paternal grandmother; Heart disease in his father, mother, and sister.    ROS:  Please see the history of present illness.  Otherwise, review of systems is positive for weight loss.  All other systems are reviewed and negative.    PHYSICAL EXAM: VS:  BP (!) 116/58   Pulse 80   Ht 5\' 8"  (1.727 m)   Wt 187 lb (84.8 kg)   BMI 28.43 kg/m  , BMI Body mass index is 28.43 kg/m. GEN: Well nourished, well developed, pleasant elderly male in no acute distress  HEENT: normal  Neck: no JVD, no masses. No carotid bruits Cardiac: RRR without murmur or gallop                Respiratory:  clear to auscultation bilaterally, normal work of breathing GI: soft, nontender, nondistended, + BS MS: no deformity or atrophy  Ext: no pretibial edema, pedal pulses 2+= bilaterally Skin: warm and dry, no rash Neuro:  Strength and  sensation are intact Psych: euthymic mood, full affect  EKG:  EKG is not ordered today.  Recent Labs: 05/22/2016: ALT 12; BUN 12; Creatinine, Ser 0.90; Hemoglobin 10.9; Platelets 148; Potassium 4.3; Sodium 135   Lipid Panel     Component Value Date/Time   CHOL 136 05/16/2015 1621   TRIG 90.0 05/16/2015 1621   TRIG 54 12/07/2005 1117   HDL 58.10 05/16/2015  1621   CHOLHDL 2 05/16/2015 1621   VLDL 18.0 05/16/2015 1621   LDLCALC 60 05/16/2015 1621      Wt Readings from Last 3 Encounters:  05/23/16 187 lb (84.8 kg)  05/23/16 185 lb 11.2 oz (84.2 kg)  05/22/16 186 lb 3.2 oz (84.5 kg)     ASSESSMENT AND PLAN: 1.  Presyncope: Patient with CAD but no history of LV dysfunction. He had a stress test in December but the study was not gated so there is no LVEF provided. An echocardiogram was done over one year ago and it demonstrated an LVEF of 55%. Will update his echocardiogram. Based on his history, I think he most likely had a vasovagal event.  2. Hypertension: The patient has lost 20 pounds. He is about to start chemotherapy. I think it is best to minimize his antihypertensive medicines. He will stop carvedilol after yesterday's episode of bradycardia and hypotension. I also recommended that he stop losartan. Continue to follow.  3. CAD status post CABG: Chronic angina noted, no recent issues. Continue isosorbide. Continue aspirin for antiplatelet therapy.  Current medicines are reviewed with the patient today.  The patient does not have concerns regarding medicines.  Labs/ tests ordered today include:   Orders Placed This Encounter  Procedures  . ECHOCARDIOGRAM COMPLETE    Disposition:   FU 6 months  Signed, Sherren Mocha, MD  05/23/2016 5:45 PM    [address], [place], [place]  [address] Phone: [phone]; Fax: [phone]

## 2016-05-24 ENCOUNTER — Ambulatory Visit
Admission: RE | Admit: 2016-05-24 | Discharge: 2016-05-24 | Disposition: A | Discharge status: Home / Self Care | Payer: PPO | Source: Ambulatory Visit | Attending: Radiation Oncology | Admitting: Radiation Oncology

## 2016-05-24 ENCOUNTER — Other Ambulatory Visit: Payer: PPO

## 2016-05-24 DIAGNOSIS — C060 Malignant neoplasm of cheek mucosa: Secondary | ICD-10-CM

## 2016-05-24 DIAGNOSIS — Z510 Encounter for antineoplastic radiation therapy: Secondary | ICD-10-CM | POA: Diagnosis not present

## 2016-05-24 MED ORDER — BIAFINE EX EMUL
CUTANEOUS | Status: DC | PRN
  Administered 2016-05-24: 14:00:00 via TOPICAL

## 2016-05-24 NOTE — Progress Notes (Signed)

## 2016-05-25 ENCOUNTER — Ambulatory Visit (HOSPITAL_BASED_OUTPATIENT_CLINIC_OR_DEPARTMENT_OTHER): Payer: PPO

## 2016-05-25 ENCOUNTER — Ambulatory Visit
Admission: RE | Admit: 2016-05-25 | Discharge: 2016-05-25 | Disposition: A | Discharge status: Home / Self Care | Payer: PPO | Source: Ambulatory Visit | Attending: Radiation Oncology | Admitting: Radiation Oncology

## 2016-05-25 VITALS — BP 137/87 | HR 56 | Temp 97.7°F | Resp 20

## 2016-05-25 DIAGNOSIS — Z510 Encounter for antineoplastic radiation therapy: Secondary | ICD-10-CM | POA: Diagnosis not present

## 2016-05-25 DIAGNOSIS — Z5111 Encounter for antineoplastic chemotherapy: Secondary | ICD-10-CM

## 2016-05-25 DIAGNOSIS — C060 Malignant neoplasm of cheek mucosa: Secondary | ICD-10-CM | POA: Diagnosis not present

## 2016-05-25 DIAGNOSIS — R011 Cardiac murmur, unspecified: Secondary | ICD-10-CM | POA: Diagnosis not present

## 2016-05-25 MED ORDER — POTASSIUM CHLORIDE 2 MEQ/ML IV SOLN
Freq: Once | INTRAVENOUS | Status: AC
  Administered 2016-05-25: 11:00:00 via INTRAVENOUS
  Filled 2016-05-25: qty 10

## 2016-05-25 MED ORDER — PALONOSETRON HCL INJECTION 0.25 MG/5ML
INTRAVENOUS | Status: AC
  Filled 2016-05-25: qty 5

## 2016-05-25 MED ORDER — CISPLATIN CHEMO INJECTION 100MG/100ML
30.0000 mg/m2 | Freq: Once | INTRAVENOUS | Status: AC
  Administered 2016-05-25: 60 mg via INTRAVENOUS
  Filled 2016-05-25: qty 60

## 2016-05-25 MED ORDER — SODIUM CHLORIDE 0.9 % IV SOLN
Freq: Once | INTRAVENOUS | Status: AC
  Administered 2016-05-25: 13:00:00 via INTRAVENOUS
  Filled 2016-05-25: qty 5

## 2016-05-25 MED ORDER — SODIUM CHLORIDE 0.9 % IV SOLN
Freq: Once | INTRAVENOUS | Status: AC
  Administered 2016-05-25: 11:00:00 via INTRAVENOUS

## 2016-05-25 MED ORDER — HEPARIN SOD (PORK) LOCK FLUSH 100 UNIT/ML IV SOLN
500.0000 [IU] | Freq: Once | INTRAVENOUS | Status: AC | PRN
  Administered 2016-05-25: 500 [IU]
  Filled 2016-05-25: qty 5

## 2016-05-25 MED ORDER — SODIUM CHLORIDE 0.9% FLUSH
10.0000 mL | INTRAVENOUS | Status: DC | PRN
  Administered 2016-05-25: 10 mL
  Filled 2016-05-25: qty 10

## 2016-05-25 MED ORDER — PALONOSETRON HCL INJECTION 0.25 MG/5ML
0.2500 mg | Freq: Once | INTRAVENOUS | Status: AC
  Administered 2016-05-25: 0.25 mg via INTRAVENOUS

## 2016-05-25 NOTE — Patient Instructions (Signed)
[facility] Discharge Instructions for Patients Receiving Chemotherapy  Today you received the following chemotherapy agents Cisplatin  To help prevent nausea and vomiting after your treatment, we encourage you to take your nausea medication   If you develop nausea and vomiting that is not controlled by your nausea medication, call the clinic.   BELOW ARE SYMPTOMS THAT SHOULD BE REPORTED IMMEDIATELY:  *FEVER GREATER THAN 100.5 F  *CHILLS WITH OR WITHOUT FEVER  NAUSEA AND VOMITING THAT IS NOT CONTROLLED WITH YOUR NAUSEA MEDICATION  *UNUSUAL SHORTNESS OF BREATH  *UNUSUAL BRUISING OR BLEEDING  TENDERNESS IN MOUTH AND THROAT WITH OR WITHOUT PRESENCE OF ULCERS  *URINARY PROBLEMS  *BOWEL PROBLEMS  UNUSUAL RASH Items with * indicate a potential emergency and should be followed up as soon as possible.  Feel free to call the clinic you have any questions or concerns. The clinic phone number is [phone].  Please show the [facility] at check-in to the Emergency Department and triage nurse.   Cisplatin injection What is this medicine? CISPLATIN (SIS pla tin) is a chemotherapy drug. It targets fast dividing cells, like cancer cells, and causes these cells to die. This medicine is used to treat many types of cancer like bladder, ovarian, and testicular cancers. This medicine may be used for other purposes; ask your health care provider or pharmacist if you have questions. COMMON BRAND NAME(S): Platinol, Platinol -AQ What should I tell my health care provider before I take this medicine? They need to know if you have any of these conditions: -blood disorders -hearing problems -kidney disease -recent or ongoing radiation therapy -an unusual or allergic reaction to cisplatin, carboplatin, other chemotherapy, other medicines, foods, dyes, or preservatives -pregnant or trying to get pregnant -breast-feeding How should I use this medicine? This drug is given as  an infusion into a vein. It is administered in a hospital or clinic by a specially trained health care professional. Talk to your pediatrician regarding the use of this medicine in children. Special care may be needed. Overdosage: If you think you have taken too much of this medicine contact a poison control center or emergency room at once. NOTE: This medicine is only for you. Do not share this medicine with others. What if I miss a dose? It is important not to miss a dose. Call your doctor or health care professional if you are unable to keep an appointment. What may interact with this medicine? -dofetilide -foscarnet -medicines for seizures -medicines to increase blood counts like filgrastim, pegfilgrastim, sargramostim -probenecid -pyridoxine used with altretamine -rituximab -some antibiotics like amikacin, gentamicin, neomycin, polymyxin B, streptomycin, tobramycin -sulfinpyrazone -vaccines -zalcitabine Talk to your doctor or health care professional before taking any of these medicines: -acetaminophen -aspirin -ibuprofen -ketoprofen -naproxen This list may not describe all possible interactions. Give your health care provider a list of all the medicines, herbs, non-prescription drugs, or dietary supplements you use. Also tell them if you smoke, drink alcohol, or use illegal drugs. Some items may interact with your medicine. What should I watch for while using this medicine? Your condition will be monitored carefully while you are receiving this medicine. You will need important blood work done while you are taking this medicine. This drug may make you feel generally unwell. This is not uncommon, as chemotherapy can affect healthy cells as well as cancer cells. Report any side effects. Continue your course of treatment even though you feel ill unless your doctor tells you to stop. In some  cases, you may be given additional medicines to help with side effects. Follow all directions  for their use. Call your doctor or health care professional for advice if you get a fever, chills or sore throat, or other symptoms of a cold or flu. Do not treat yourself. This drug decreases your body's ability to fight infections. Try to avoid being around people who are sick. This medicine may increase your risk to bruise or bleed. Call your doctor or health care professional if you notice any unusual bleeding. Be careful brushing and flossing your teeth or using a toothpick because you may get an infection or bleed more easily. If you have any dental work done, tell your dentist you are receiving this medicine. Avoid taking products that contain aspirin, acetaminophen, ibuprofen, naproxen, or ketoprofen unless instructed by your doctor. These medicines may hide a fever. Do not become pregnant while taking this medicine. Women should inform their doctor if they wish to become pregnant or think they might be pregnant. There is a potential for serious side effects to an unborn child. Talk to your health care professional or pharmacist for more information. Do not breast-feed an infant while taking this medicine. Drink fluids as directed while you are taking this medicine. This will help protect your kidneys. Call your doctor or health care professional if you get diarrhea. Do not treat yourself. What side effects may I notice from receiving this medicine? Side effects that you should report to your doctor or health care professional as soon as possible: -allergic reactions like skin rash, itching or hives, swelling of the face, lips, or tongue -signs of infection - fever or chills, cough, sore throat, pain or difficulty passing urine -signs of decreased platelets or bleeding - bruising, pinpoint red spots on the skin, black, tarry stools, nosebleeds -signs of decreased red blood cells - unusually weak or tired, fainting spells, lightheadedness -breathing problems -changes in hearing -gout  pain -low blood counts - This drug may decrease the number of white blood cells, red blood cells and platelets. You may be at increased risk for infections and bleeding. -nausea and vomiting -pain, swelling, redness or irritation at the injection site -pain, tingling, numbness in the hands or feet -problems with balance, movement -trouble passing urine or change in the amount of urine Side effects that usually do not require medical attention (report to your doctor or health care professional if they continue or are bothersome): -changes in vision -loss of appetite -metallic taste in the mouth or changes in taste This list may not describe all possible side effects. Call your doctor for medical advice about side effects. You may report side effects to FDA at 1-800-FDA-1088. Where should I keep my medicine? This drug is given in a hospital or clinic and will not be stored at home. NOTE: This sheet is a summary. It may not cover all possible information. If you have questions about this medicine, talk to your doctor, pharmacist, or health care provider.  2018 Elsevier/Gold Standard (2007-03-25 14:40:54)

## 2016-05-26 ENCOUNTER — Telehealth: Payer: Self-pay | Admitting: Hematology and Oncology

## 2016-05-26 NOTE — Telephone Encounter (Signed)
S/w pt, gave appt for 5/30 @ 12.15 and advised him to collect a calendar then.

## 2016-05-29 ENCOUNTER — Ambulatory Visit
Admission: RE | Admit: 2016-05-29 | Discharge: 2016-05-29 | Disposition: A | Discharge status: Home / Self Care | Payer: PPO | Source: Ambulatory Visit | Attending: Radiation Oncology | Admitting: Radiation Oncology

## 2016-05-29 ENCOUNTER — Encounter: Payer: Self-pay | Admitting: Radiation Oncology

## 2016-05-29 VITALS — BP 131/74 | HR 57 | Temp 97.6°F | Wt 187.0 lb

## 2016-05-29 DIAGNOSIS — R011 Cardiac murmur, unspecified: Secondary | ICD-10-CM | POA: Diagnosis not present

## 2016-05-29 DIAGNOSIS — C060 Malignant neoplasm of cheek mucosa: Secondary | ICD-10-CM | POA: Diagnosis not present

## 2016-05-29 DIAGNOSIS — Z510 Encounter for antineoplastic radiation therapy: Secondary | ICD-10-CM | POA: Diagnosis not present

## 2016-05-29 NOTE — Progress Notes (Signed)
Andres Smith presents for his 4th fraction of radiation to his Left Bucccal and Left cheek. He denies pain. He has some fatigue. He tells me that his voice is weaker. His radiation site is intact. He will begin using biafine twice daily. He is eating well. He has not used his feeding tube except for flushes. He has received his 1st chemotherapy and did well per his report.   BP 131/74   Pulse (!) 57   Temp 97.6 F (36.4 C)   Wt 187 lb (84.8 kg)   SpO2 98%   BMI 28.43 kg/m    Wt Readings from Last 3 Encounters:  05/29/16 187 lb (84.8 kg)  05/23/16 187 lb (84.8 kg)  05/23/16 185 lb 11.2 oz (84.2 kg)

## 2016-05-29 NOTE — Progress Notes (Signed)
   Weekly Management Note:  Outpatient    ICD-9-CM ICD-10-CM   1. Buccal mucosa squamous cell carcinoma (HCC) 145.0 C06.0     Current Dose:  8 Gy  Projected Dose: 60 Gy   Narrative:  The patient presents for routine under treatment assessment.  CBCT/MVCT images/Port film x-rays were reviewed.  The chart was checked.    The patient presents for his 4th fraction. He denies pain. He reports some fatigue. The patient reports his voice is weaker. He is eating well, and reports he has not used his feeding tube except for flushes. He has received his first chemotherapy last week, and reports it went well.   Physical Findings:  weight is 187 lb (84.8 kg). His temperature is 97.6 F (36.4 C). His blood pressure is 131/74 and his pulse is 57 (abnormal). His oxygen saturation is 98%.   Wt Readings from Last 3 Encounters:  05/29/16 187 lb (84.8 kg)  05/23/16 187 lb (84.8 kg)  05/23/16 185 lb 11.2 oz (84.2 kg)   No mucositis in the mouth. No oral thrush.  Impression:  The patient is tolerating radiotherapy.  Plan:  Continue radiotherapy as planned. The patient will apply Radiaplex to the skin within the treatment field as directed.  ________________________________   [name], M.D.  This document serves as a record of services personally performed by [name], MD. It was created on her behalf by Maryla Morrow, a trained medical scribe. The creation of this record is based on the scribe's personal observations and the provider's statements to them. This document has been checked and approved by the attending provider.

## 2016-05-30 ENCOUNTER — Ambulatory Visit
Admission: RE | Admit: 2016-05-30 | Discharge: 2016-05-30 | Disposition: A | Discharge status: Home / Self Care | Payer: PPO | Source: Ambulatory Visit | Attending: Radiation Oncology | Admitting: Radiation Oncology

## 2016-05-30 ENCOUNTER — Telehealth: Payer: Self-pay | Admitting: *Deleted

## 2016-05-30 ENCOUNTER — Other Ambulatory Visit (HOSPITAL_BASED_OUTPATIENT_CLINIC_OR_DEPARTMENT_OTHER): Payer: PPO

## 2016-05-30 DIAGNOSIS — R011 Cardiac murmur, unspecified: Secondary | ICD-10-CM | POA: Diagnosis not present

## 2016-05-30 DIAGNOSIS — C060 Malignant neoplasm of cheek mucosa: Secondary | ICD-10-CM | POA: Diagnosis not present

## 2016-05-30 DIAGNOSIS — Z510 Encounter for antineoplastic radiation therapy: Secondary | ICD-10-CM | POA: Diagnosis not present

## 2016-05-30 LAB — COMPREHENSIVE METABOLIC PANEL
ALBUMIN: 3.8 g/dL (ref 3.5–5.0)
ALK PHOS: 56 U/L (ref 40–150)
ALT: 17 U/L (ref 0–55)
ANION GAP: 5 meq/L (ref 3–11)
AST: 17 U/L (ref 5–34)
BILIRUBIN TOTAL: 0.69 mg/dL (ref 0.20–1.20)
BUN: 15.1 mg/dL (ref 7.0–26.0)
CALCIUM: 9.8 mg/dL (ref 8.4–10.4)
CHLORIDE: 103 meq/L (ref 98–109)
CO2: 30 mEq/L — ABNORMAL HIGH (ref 22–29)
CREATININE: 0.9 mg/dL (ref 0.7–1.3)
EGFR: 83 mL/min/{1.73_m2} — ABNORMAL LOW (ref >90)
Glucose: 152 mg/dl — ABNORMAL HIGH (ref 70–140)
Potassium: 5.5 mEq/L — ABNORMAL HIGH (ref 3.5–5.1)
Sodium: 138 mEq/L (ref 136–145)
TOTAL PROTEIN: 6.7 g/dL (ref 6.4–8.3)

## 2016-05-30 LAB — CBC WITH DIFFERENTIAL/PLATELET
BASO%: 0.6 % (ref 0.0–2.0)
Basophils Absolute: 0 10*3/uL (ref 0.0–0.1)
EOS%: 2.8 % (ref 0.0–7.0)
Eosinophils Absolute: 0.1 10*3/uL (ref 0.0–0.5)
HCT: 36.2 % — ABNORMAL LOW (ref 38.4–49.9)
HGB: 12.2 g/dL — ABNORMAL LOW (ref 13.0–17.1)
LYMPH%: 18.1 % (ref 14.0–49.0)
MCH: 36.5 pg — ABNORMAL HIGH (ref 27.2–33.4)
MCHC: 33.6 g/dL (ref 32.0–36.0)
MCV: 108.5 fL — ABNORMAL HIGH (ref 79.3–98.0)
MONO#: 0.4 10*3/uL (ref 0.1–0.9)
MONO%: 9.7 % (ref 0.0–14.0)
NEUT%: 68.8 % (ref 39.0–75.0)
NEUTROS ABS: 2.9 10*3/uL (ref 1.5–6.5)
Platelets: 206 10*3/uL (ref 140–400)
RBC: 3.34 10*6/uL — AB (ref 4.20–5.82)
RDW: 15.2 % — AB (ref 11.0–14.6)
WBC: 4.2 10*3/uL (ref 4.0–10.3)
lymph#: 0.8 10*3/uL — ABNORMAL LOW (ref 0.9–3.3)

## 2016-05-30 LAB — MAGNESIUM: Magnesium: 2.2 mg/dl (ref 1.5–2.5)

## 2016-05-30 MED ORDER — SODIUM POLYSTYRENE SULFONATE 15 GM/60ML PO SUSP: 30.0000 g | Freq: Once | ORAL | 0 refills | Status: AC

## 2016-05-30 NOTE — Telephone Encounter (Signed)
Oncology Nurse Navigator Documentation  Called Mr. Nave, informed him SLP appt has been scheduled for 6/4 2:00 PM at [facility] following his 1:20 RT.  I explained registration process, he understands Glendell Docker will meet him in lobby.  He understands, also, I will notify [facility] to reschedule appt with Serafina Royals, PT.  Gayleen Orem, RN, BSN, [place] Oncology Nurse [place] [facility] [place] [phone]

## 2016-05-30 NOTE — Telephone Encounter (Signed)
-----   Message from [name], MD sent at 05/30/2016  2:21 PM EDT ----- Regarding: high potassium His labs came back high potassium I recommend kayexalate 30 g PO X 1  Please call in and ask him to take it today. Warn him about risk of diarrhea ----- Message ----- From: Interface, Lab In Three Zero One Sent: 05/30/2016  12:32 PM To: [name], MD

## 2016-05-31 ENCOUNTER — Ambulatory Visit (HOSPITAL_BASED_OUTPATIENT_CLINIC_OR_DEPARTMENT_OTHER): Payer: PPO | Admitting: Hematology and Oncology

## 2016-05-31 ENCOUNTER — Telehealth: Payer: Self-pay | Admitting: Hematology and Oncology

## 2016-05-31 ENCOUNTER — Ambulatory Visit
Admission: RE | Admit: 2016-05-31 | Discharge: 2016-05-31 | Disposition: A | Discharge status: Home / Self Care | Payer: PPO | Source: Ambulatory Visit | Attending: Radiation Oncology | Admitting: Radiation Oncology

## 2016-05-31 ENCOUNTER — Encounter: Payer: Self-pay | Admitting: Hematology and Oncology

## 2016-05-31 DIAGNOSIS — C4442 Squamous cell carcinoma of skin of scalp and neck: Secondary | ICD-10-CM | POA: Diagnosis not present

## 2016-05-31 DIAGNOSIS — C060 Malignant neoplasm of cheek mucosa: Secondary | ICD-10-CM

## 2016-05-31 DIAGNOSIS — R634 Abnormal weight loss: Secondary | ICD-10-CM

## 2016-05-31 DIAGNOSIS — E875 Hyperkalemia: Secondary | ICD-10-CM | POA: Insufficient documentation

## 2016-05-31 DIAGNOSIS — D485 Neoplasm of uncertain behavior of skin: Secondary | ICD-10-CM | POA: Diagnosis not present

## 2016-05-31 DIAGNOSIS — L821 Other seborrheic keratosis: Secondary | ICD-10-CM | POA: Diagnosis not present

## 2016-05-31 DIAGNOSIS — L812 Freckles: Secondary | ICD-10-CM | POA: Diagnosis not present

## 2016-05-31 DIAGNOSIS — Z85828 Personal history of other malignant neoplasm of skin: Secondary | ICD-10-CM | POA: Diagnosis not present

## 2016-05-31 DIAGNOSIS — Z510 Encounter for antineoplastic radiation therapy: Secondary | ICD-10-CM | POA: Diagnosis not present

## 2016-05-31 DIAGNOSIS — D1801 Hemangioma of skin and subcutaneous tissue: Secondary | ICD-10-CM | POA: Diagnosis not present

## 2016-05-31 NOTE — Assessment & Plan Note (Signed)
He tolerated chemotherapy well without major side effects We will proceed with treatment without dose adjustment I will see him on a weekly basis for supportive care

## 2016-05-31 NOTE — Assessment & Plan Note (Addendum)
He had recent hyperkalemia for some unknown reason He had significant diarrhea/bowel movement yesterday after Kayexalate We will monitor carefully We will proceed with treatment tomorrow without dose adjustment  I have discussed with the pharmacist to omit potassium chloride in the pre- and post-hydration fluids tomorrow

## 2016-05-31 NOTE — Progress Notes (Signed)
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 Tumor measures 0.9 cm in greatest dimension. Tumor extends to the inked deep tissue edge. Depth of invasion: 4 mm (in this material) See note.  Note: an immunostains for P16 was performed at an outside hospital and provided for [place] to review; p16 immunostain is negative. Per outside report, In situ hybridization for high-risk HPV types showed no evidence of transcriptionally active high-risk HPV types.      03/21/2016 Initial Diagnosis    Salient findings:  -EAC's clear -Lesion within left buccal mucosa (pictured below) with about 60m depth. Width is about 2/3 of the left buccal mucosal surface area by palpation. Extends to the oral commissure.  -Tongue soft and mobile without mucosal lesions -Neck soft with no palpable neck masses -Right handed -Right hand is pink and warm with Allen's test negative (normal)  -Left forearm very sun damaged with multiple treated areas from precancerous or cancerous areas. Right forearm not as dramatic, presumably from sun exposure while driving      [id number]    RESECTION OF ORAL BUCCAL MUCOSA SELECTIVE NECK DISSECTION FREE FLAP RADIAL FOREARM PR SPLIT GInkster<100 SQCM [15100] (SPLIT THICKNESS SKIN GRAFT LEG         04/13/2016 Pathology Results    A.LEFT ORAL COMMISSURE INFERIOR, EXCISION: No malignancy identified.  B.LEFT ORAL COMMISSURE SUPERIOR, EXCISION: No malignancy identified.  C.LEFT DEEP ORAL COMMISSURE, EXCISION: No malignancy identified.  D.RIGHT POSTERIOR BUCCAL INFERIOR, EXCISION: No malignancy identified.  E.RIGHT POSTERIOR BUCCAL SUPERIOR, EXCISION: No malignancy identified.  F.ORAL CAVITY, LEFT BUCCAL, EXCISION:  Invasive squamous cell carcinoma, moderately differentiated,  keratinizing. Tumor size:2.2 cm. Tumor depth of invasion:0.8 cm. Perineural invasion identified. Margins negative for malignancy. Pathologic stage:pT2 pN2a. See tumor protocol summary below.  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 Tumor deposit size:1.1 cm. Extranodal extension present. Benign salivary gland tissue.  SURGICAL PATHOLOGY CANCER CASE SUMMARY (AJCC 8TH EDITION) LIP AND ORAL CAVITY CAP Protocol posting date: June, 2017 Version: LipOralCavity 4.0.0.0  PROCEDURE: Wide resection of buccal mucosa TUMOR SITE:Oral:Buccal mucosa TUMOR LATERALITY: Left TUMOR FOCALITY: Unifocal TUMOR SIZE:Greatest dimension: 2.2 cm TUMOR DEPTH OF INVASION (DOI):8 mm HISTOLOGIC TYPE:Squamous cell carcinoma, conventional HISTOLOGIC GRADE:G2: Moderately differentiated SPECIMEN MARGINS:Uninvolved by invasive carcinoma  DISTANCE FROM CLOSEST MARGIN:1 mm  SPECIFY MARGIN(S):Deep LYMPHOVASCULAR INVASION:Present PERINEURAL INVASION: Present REGIONAL LYMPH NODES: NUMBER OF LYMPH NODES INVOLVED: 1 NUMBER OF LYMPH NODES EXAMINED: 4 LATERALITY OF LYMPH NODES INVOLVED: Ipsilateral SIZE OF LARGEST METASTATIC DEPOSIT:1.1 cm EXTRANODAL EXTENSION:Present PATHOLOGIC STAGE CLASSIFICATION (pTNM, AJCC 8th  Ed): pT2 pN2a pT2: umor >2 cm but <=4 cm and <=10 mm DOI pN2a:Metastasis in a single ipsilateral lymph node, 3 cm or  smaller in greatest dimension and ENE(+) BIOMARKERS:performed on prior outside biopsy ([facility] case [id number] outside case [id number] P16 (Immunohistochemistry):Negative HPV (high-risk types by in situ hybridization):Reported as negative      05/11/2016 Imaging    1.  4 mm left lower lobe pulmonary nodule. Attention on follow-up imaging recommended as metastatic disease not excluded. 2. 2.8 cm left thyroid nodule. Thyroid ultrasound recommended to further evaluate. 3. Bilateral renal cysts. 4. Coronary artery and thoracoabdominal aortic atherosclerosis. 5. Ankylosing spondylitis.        05/11/2016 Imaging    CT neck: Surgical clips in the region of the left buccal mucosa, presumably at the previous primary site. Mild scarring in that region measuring about 8 mm. Cannot assess for residual or recurrent disease in that location, but this may simply be scarring. Previous left submandibular resection and left neck node dissection. No abnormal nodes presently. Multinodular goiter. Ankylosing spondylitis.  C1-2 remains a mobile segment.      05/21/2016 Procedure    1. Successful placement of a right internal jugular approach power injectable Port-A-Cath. The Port a catheter is ready for immediate use. 2. Successful fluoroscopic insertion of a 20-French pull-through gastrostomy tube. The gastrostomy may be used immediately for medication administration and may be utilized in 24 hrs for the initiation of feeds.      05/23/2016 -  Radiation Therapy    He received radiation treatment       05/25/2016 -  Chemotherapy    He received weekly reduced dose cisplatin        INTERVAL HISTORY: Please see below for problem oriented charting. He is seen prior to cycle 2 of chemotherapy He tolerated treatment well Denies hearing loss, neuropathy, nausea  or dehydration. He has lost a bit of weight He denies pain Denies dysphagia  REVIEW OF SYSTEMS:   Constitutional: Denies fevers, chills or abnormal weight loss Eyes: Denies blurriness of vision Ears, nose, mouth, throat, and face: Denies mucositis or sore throat Respiratory: Denies cough, dyspnea or wheezes Cardiovascular: Denies palpitation, chest discomfort or lower extremity swelling Gastrointestinal:  Denies nausea, heartburn or change in bowel habits Skin: Denies abnormal skin rashes Lymphatics: Denies new lymphadenopathy or easy bruising Neurological:Denies numbness, tingling or new weaknesses Behavioral/Psych: Mood is stable, no new changes  All other systems were reviewed with the patient and are negative.  I have reviewed the past medical history, past surgical history, social history and family history with the patient and they are unchanged from previous note.  ALLERGIES:  has No Known Allergies.  MEDICATIONS:  Current Outpatient Prescriptions  Medication Sig Dispense Refill  . aspirin EC 81 MG tablet Take 81 mg by mouth daily.    Marland Kitchen atorvastatin (LIPITOR) 80 MG tablet Take 80 mg by mouth at bedtime.    . cyanocobalamin (,VITAMIN B-12,) 1000 MCG/ML injection INJECT 1ML ONCE MONTHLY. 3 mL 1  . isosorbide mononitrate (IMDUR) 60 MG 24 hr tablet Take 90 mg by mouth daily.    Marland Kitchen lidocaine-prilocaine (EMLA) cream Apply to affected area once 30 g 3  . mercaptopurine (PURINETHOL) 50 MG tablet TAKE ONE TABLET ONCE A DAY ON AN EMPTY STOMACH 1 HR BEFORE OR 2HRS AFTER A MEAL 30 tablet 2  . mesalamine (LIALDA) 1.2 g EC tablet Take 2.4 g by mouth daily with breakfast.    . metFORMIN (GLUCOPHAGE) 500 MG tablet Take 1 tablet (500 mg total) by mouth 2 (two) times daily with a meal. Must Keep 05/16/15 appt for future refills 180 tablet 0  . nitroGLYCERIN (NITROSTAT) 0.4 MG SL tablet Place 0.4 mg under the tongue every 5 (five) minutes as needed for chest pain.    Marland Kitchen ondansetron (ZOFRAN) 8 MG  tablet Take 1 tablet (8 mg total) by mouth  2 (two) times daily as needed. Start on the third day after chemotherapy. (Patient not taking: Reported on 05/31/2016) 30 tablet 1  . oxyCODONE (OXY IR/ROXICODONE) 5 MG immediate release tablet Take 5 mg by mouth every 6 (six) hours as needed for severe pain.    Marland Kitchen prochlorperazine (COMPAZINE) 10 MG tablet Take 1 tablet (10 mg total) by mouth every 6 (six) hours as needed (Nausea or vomiting). (Patient not taking: Reported on 05/31/2016) 30 tablet 1   No current facility-administered medications for this visit.     PHYSICAL EXAMINATION: ECOG PERFORMANCE STATUS: 0 - Asymptomatic  Vitals:   05/31/16 1419  BP: 118/61  Pulse: 67  Resp: 17  Temp: 97.9 F (36.6 C)   Filed Weights   05/31/16 1419  Weight: 183 lb 4.8 oz (83.1 kg)    GENERAL:alert, no distress and comfortable SKIN: skin color, texture, turgor are normal, no rashes or significant lesions EYES: normal, Conjunctiva are pink and non-injected, sclera clear OROPHARYNX:no exudate, no erythema and lips, buccal mucosa, and tongue normal  NECK: supple, thyroid normal size, non-tender, without nodularity LYMPH:  no palpable lymphadenopathy in the cervical, axillary or inguinal LUNGS: clear to auscultation and percussion with normal breathing effort HEART: regular rate & rhythm and no murmurs and no lower extremity edema ABDOMEN:abdomen soft, non-tender and normal bowel sounds Musculoskeletal:no cyanosis of digits and no clubbing  NEURO: alert & oriented x 3 with fluent speech, no focal motor/sensory deficits  LABORATORY DATA:  I have reviewed the data as listed    Component Value Date/Time   NA 138 05/30/2016 1221   K 5.5 No visable hemolysis (H) 05/30/2016 1221   CL 102 05/22/2016 1020   CO2 30 (H) 05/30/2016 1221   GLUCOSE 152 (H) 05/30/2016 1221   GLUCOSE 98 12/12/2005 0729   BUN 15.1 05/30/2016 1221   CREATININE 0.9 05/30/2016 1221   CALCIUM 9.8 05/30/2016 1221   PROT 6.7  05/30/2016 1221   ALBUMIN 3.8 05/30/2016 1221   AST 17 05/30/2016 1221   ALT 17 05/30/2016 1221   ALKPHOS 56 05/30/2016 1221   BILITOT 0.69 05/30/2016 1221   GFRNONAA >60 05/22/2016 1020   GFRAA >60 05/22/2016 1020    No results found for: SPEP, UPEP  Lab Results  Component Value Date   WBC 4.2 05/30/2016   NEUTROABS 2.9 05/30/2016   HGB 12.2 (L) 05/30/2016   HCT 36.2 (L) 05/30/2016   MCV 108.5 (H) 05/30/2016   PLT 206 05/30/2016      Chemistry      Component Value Date/Time   NA 138 05/30/2016 1221   K 5.5 No visable hemolysis (H) 05/30/2016 1221   CL 102 05/22/2016 1020   CO2 30 (H) 05/30/2016 1221   BUN 15.1 05/30/2016 1221   CREATININE 0.9 05/30/2016 1221      Component Value Date/Time   CALCIUM 9.8 05/30/2016 1221   ALKPHOS 56 05/30/2016 1221   AST 17 05/30/2016 1221   ALT 17 05/30/2016 1221   BILITOT 0.69 05/30/2016 1221       RADIOGRAPHIC STUDIES: I have personally reviewed the radiological images as listed and agreed with the findings in the report. Dg Chest 2 View  Result Date: 05/22/2016 CLINICAL DATA:  Syncope. EXAM: CHEST  2 VIEW COMPARISON:  Radiograph of May 21, 2016. FINDINGS: Stable cardiomediastinal silhouette. Status post coronary artery bypass graft. No pneumothorax or pleural effusion is noted. No acute pulmonary disease is noted. Right internal jugular Port-A-Cath is noted with distal tip in  expected position of cavoatrial junction. Bony thorax is unremarkable. IMPRESSION: No active cardiopulmonary disease. Electronically Signed   By: Marijo Conception, M.D.   On: 05/22/2016 11:59   Ct Soft Tissue Neck W Contrast  Result Date: 05/11/2016 CLINICAL DATA:  Carcinoma of the left buccal mucosa diagnosed in December of 2017 EXAM: CT NECK WITH CONTRAST TECHNIQUE: Multidetector CT imaging of the neck was performed using the standard protocol following the bolus administration of intravenous contrast. CONTRAST:  150m ISOVUE-300 IOPAMIDOL (ISOVUE-300)  INJECTION 61% COMPARISON:  None. FINDINGS: Pharynx and larynx: Surgical clips in the region of the left buccal mucosa. Mild scarring in that region measuring about 8 mm. No definite actual mass lesion. No other focal mucosal finding. Larynx appears normal. Salivary glands: Left parotid slightly atrophic. Right parotid normal. Right submandibular normal. Left submandibular previously resected. Thyroid: Multinodular goiter with largest nodule inferior on the left measuring 3.1 cm. Lymph nodes: No enlarged or low-density nodes on either side of the neck. Calcified 4 mm supraclavicular node on the left. Surgical clips related to previous level 2 and level 3 node resections. Vascular: Bilateral carotid calcification. Limited intracranial: Normal except for vascular calcification. Visualized orbits: Normal Mastoids and visualized paranasal sinuses: Clear/normal Skeleton: Ankylosis of the spine consistent with ankylosing spondylitis. Ankylosis of the occiput put and C1. 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 Upper chest: See results of chest CT. Other: None IMPRESSION: Surgical clips in the region of the left buccal mucosa, presumably at the previous primary site. Mild scarring in that region measuring about 8 mm. Cannot assess for residual or recurrent disease in that location, but this may simply be scarring. Previous left submandibular resection and left neck node dissection. No abnormal nodes presently. Multinodular goiter. Ankylosing spondylitis.  C1-2 remains a mobile segment. Electronically Signed   By: MNelson ChimesM.D.   On: 05/11/2016 09:17   Ct Chest W Contrast  Result Date: 05/11/2016 CLINICAL DATA:  Head and neck cancer. EXAM: CT CHEST, ABDOMEN, WITH CONTRAST TECHNIQUE: Multidetector CT imaging of the chest, abdomen and pelvis was performed following the standard protocol during bolus administration of intravenous contrast. CONTRAST:  1034mISOVUE-300 IOPAMIDOL (ISOVUE-300)  INJECTION 61% COMPARISON:  None. FINDINGS: CT CHEST FINDINGS Cardiovascular: The heart size is normal. No pericardial effusion. Patient is status post CABG. Atherosclerotic calcification is noted in the wall of the thoracic aorta. Mediastinum/Nodes: No mediastinal lymphadenopathy. There is no hilar lymphadenopathy. The esophagus has normal imaging features. 2.8 cm left thyroid nodule associated with small right thyroid nodules. There is no axillary lymphadenopathy. Lungs/Pleura: 4 mm left lower lobe pulmonary nodule seen image 98 series 7. No focal airspace consolidation. No pulmonary edema or pleural effusion. Musculoskeletal: Bone windows reveal no worrisome lytic or sclerotic osseous lesions. CT ABDOMEN FINDINGS Hepatobiliary: No focal abnormality within the liver parenchyma. 14 mm calcified gallstone. No intrahepatic or extrahepatic biliary dilation. Pancreas: No focal mass lesion. No dilatation of the main duct. No intraparenchymal cyst. No peripancreatic edema. Spleen: No splenomegaly. No focal mass lesion. Adrenals/Urinary Tract: No adrenal nodule or mass. 5.6 cm exophytic cyst right kidney. 4.2 cm left renal cyst associated with another smaller 2.7 cm cyst lower pole left kidney. Small central sinus cysts noted in the kidneys bilaterally Stomach/Bowel: Stomach is nondistended. No gastric wall thickening. No evidence of outlet obstruction. Duodenum is normally positioned as is the ligament of Treitz. Visualize small bowel loops are unremarkable. Abdominal segments of the colon are unremarkable. Vascular/Lymphatic: There is abdominal aortic atherosclerosis without aneurysm.  There is no gastrohepatic or hepatoduodenal ligament lymphadenopathy. No intraperitoneal or retroperitoneal lymphadenopathy. Other: No intraperitoneal free fluid. Musculoskeletal: Bone windows reveal no worrisome lytic or sclerotic osseous lesions. Changes of ankylosing spondylitis noted. IMPRESSION: 1. 4 mm left lower lobe pulmonary  nodule. Attention on follow-up imaging recommended as metastatic disease not excluded. 2. 2.8 cm left thyroid nodule. Thyroid ultrasound recommended to further evaluate. 3. Bilateral renal cysts. 4. Coronary artery and thoracoabdominal aortic atherosclerosis. 5. Ankylosing spondylitis. Electronically Signed   By: Misty Stanley M.D.   On: 05/11/2016 09:32   Ct Abdomen W Contrast  Result Date: 05/11/2016 CLINICAL DATA:  Head and neck cancer. EXAM: CT CHEST, ABDOMEN, WITH CONTRAST TECHNIQUE: Multidetector CT imaging of the chest, abdomen and pelvis was performed following the standard protocol during bolus administration of intravenous contrast. CONTRAST:  169m ISOVUE-300 IOPAMIDOL (ISOVUE-300) INJECTION 61% COMPARISON:  None. FINDINGS: CT CHEST FINDINGS Cardiovascular: The heart size is normal. No pericardial effusion. Patient is status post CABG. Atherosclerotic calcification is noted in the wall of the thoracic aorta. Mediastinum/Nodes: No mediastinal lymphadenopathy. There is no hilar lymphadenopathy. The esophagus has normal imaging features. 2.8 cm left thyroid nodule associated with small right thyroid nodules. There is no axillary lymphadenopathy. Lungs/Pleura: 4 mm left lower lobe pulmonary nodule seen image 98 series 7. No focal airspace consolidation. No pulmonary edema or pleural effusion. Musculoskeletal: Bone windows reveal no worrisome lytic or sclerotic osseous lesions. CT ABDOMEN FINDINGS Hepatobiliary: No focal abnormality within the liver parenchyma. 14 mm calcified gallstone. No intrahepatic or extrahepatic biliary dilation. Pancreas: No focal mass lesion. No dilatation of the main duct. No intraparenchymal cyst. No peripancreatic edema. Spleen: No splenomegaly. No focal mass lesion. Adrenals/Urinary Tract: No adrenal nodule or mass. 5.6 cm exophytic cyst right kidney. 4.2 cm left renal cyst associated with another smaller 2.7 cm cyst lower pole left kidney. Small central sinus cysts noted in  the kidneys bilaterally Stomach/Bowel: Stomach is nondistended. No gastric wall thickening. No evidence of outlet obstruction. Duodenum is normally positioned as is the ligament of Treitz. Visualize small bowel loops are unremarkable. Abdominal segments of the colon are unremarkable. Vascular/Lymphatic: There is abdominal aortic atherosclerosis without aneurysm. There is no gastrohepatic or hepatoduodenal ligament lymphadenopathy. No intraperitoneal or retroperitoneal lymphadenopathy. Other: No intraperitoneal free fluid. Musculoskeletal: Bone windows reveal no worrisome lytic or sclerotic osseous lesions. Changes of ankylosing spondylitis noted. IMPRESSION: 1. 4 mm left lower lobe pulmonary nodule. Attention on follow-up imaging recommended as metastatic disease not excluded. 2. 2.8 cm left thyroid nodule. Thyroid ultrasound recommended to further evaluate. 3. Bilateral renal cysts. 4. Coronary artery and thoracoabdominal aortic atherosclerosis. 5. Ankylosing spondylitis. Electronically Signed   By: EMisty StanleyM.D.   On: 05/11/2016 09:32   Ct Abdomen Pelvis W Contrast  Result Date: 05/21/2016 CLINICAL DATA:  Acute onset of shortness of breath and generalized abdominal pain. Recent G-tube placement. Initial encounter. EXAM: CT ABDOMEN AND PELVIS WITH CONTRAST TECHNIQUE: Multidetector CT imaging of the abdomen and pelvis was performed using the standard protocol following bolus administration of intravenous contrast. CONTRAST:  1063mISOVUE-300 IOPAMIDOL (ISOVUE-300) INJECTION 61% COMPARISON:  CT of the abdomen and pelvis performed 05/11/2016 FINDINGS: Lower chest: Diffuse coronary artery calcifications are seen. The patient is status post median sternotomy. Bibasilar atelectasis or scarring is noted. Hepatobiliary: The liver is unremarkable in appearance. A stone is noted dependently within the gallbladder. The gallbladder is otherwise unremarkable. The common bile duct remains normal in caliber. Pancreas:  The pancreas is within normal limits. Spleen: The  spleen is unremarkable in appearance. Adrenals/Urinary Tract: The adrenal glands are unremarkable in appearance. Bilateral renal cysts are noted. Nonspecific perinephric stranding is noted bilaterally. There is no evidence of hydronephrosis. No renal or ureteral stones are identified. Stomach/Bowel: The patient's G-tube is noted at the body of the stomach. A small amount of free air is seen tracking within the abdomen, reflecting recent surgery. The small bowel is within normal limits. The appendix is not visualized; there is no evidence for appendicitis. There is wall thickening along the distal sigmoid colon and rectum, raising question for mild proctitis. The colon is otherwise unremarkable in appearance. Vascular/Lymphatic: Scattered calcification is seen along the abdominal aorta and its branches. The abdominal aorta is otherwise grossly unremarkable. The inferior vena cava is grossly unremarkable. No retroperitoneal lymphadenopathy is seen. No pelvic sidewall lymphadenopathy is identified. Reproductive: The bladder is mildly distended. Mild bladder wall thickening could reflect cystitis. The prostate is enlarged, measuring 5.6 cm in transverse dimension, with mild heterogeneity. Other: A small left inguinal hernia is noted, containing only fat. Musculoskeletal: No acute osseous abnormalities are identified. Anterior bridging osteophytes are noted along the lower thoracic and upper lumbar spine. Facet disease is noted along the lumbar spine. The visualized musculature is unremarkable in appearance. IMPRESSION: 1. Wall thickening along the distal sigmoid colon rectum raises question for mild proctitis. 2. Mild bladder wall thickening could reflect cystitis. 3. Postoperative free air within the abdomen, given recent surgery. G-tube noted ending at the body of the stomach. 4. Diffuse coronary artery calcifications seen. 5. Cholelithiasis.  Gallbladder otherwise  unremarkable. 6. Bilateral renal cysts noted. 7. Scattered aortic atherosclerosis. 8. Enlarged prostate, with mild heterogeneity. Would correlate with PSA. 9. Small left inguinal hernia, containing only fat. 10. Anterior bridging osteophytes along the lower thoracic and upper lumbar spine. Electronically Signed   By: Garald Balding M.D.   On: 05/21/2016 19:21   Ir Gastrostomy Tube  Result Date: 05/21/2016 INDICATION: History of head neck cancer, in need of durable intravenous access for chemotherapy administration. In need of enteric access for enteric nutrition supplementation prior to initiation of chemoradiation. EXAM: 1. IMPLANTED PORT A CATH PLACEMENT WITH ULTRASOUND AND FLUOROSCOPIC GUIDANCE 2. FLUOROSCOPIC GUIDED PERCUTANEOUS GASTROSTOMY TUBE PLACEMENT COMPARISON:  CT the chest, abdomen pelvis - 05/12/2018 MEDICATIONS: Glucagon 1 mg IV; Ancef 2 g IV; The antibiotic was administered within an appropriate time interval prior to skin puncture. ANESTHESIA/SEDATION: Moderate (conscious) sedation was employed during this procedure. A total of Versed 6 mg and Fentanyl 100 mcg was administered intravenously. Moderate Sedation Time: 45 minutes. The patient's level of consciousness and vital signs were monitored continuously by radiology nursing throughout the procedure under my direct supervision. CONTRAST:  20 - cc Isovue 300, administered into the stomach lumen. FLUOROSCOPY TIME:  A total of 1 minute, 54 seconds (38.2 mGy) fluoroscopy time was utilized for both procedures. COMPLICATIONS: None immediate. PROCEDURE: The procedures, risks, benefits, and alternatives were explained to the patient. Questions regarding the procedure were encouraged and answered. The patient understands and consents to both procedures. Attention was initially paid towards placement of the [facility] a Catheter. The right neck and chest were prepped with chlorhexidine in a sterile fashion, and a sterile drape was applied covering the  operative field. Maximum barrier sterile technique with sterile gowns and gloves were used for the procedure. A timeout was performed prior to the initiation of the procedure. Local anesthesia was provided with 1% lidocaine with epinephrine. After creating a small venotomy incision, a micropuncture kit was  utilized to access the internal jugular vein under direct, real-time ultrasound guidance. Ultrasound image documentation was performed. The microwire was kinked to measure appropriate catheter length. A subcutaneous port pocket was then created along the upper chest wall utilizing a combination of Gaumer and blunt dissection. The pocket was irrigated with sterile saline. A single lumen thin power injectable port was chosen for placement. The 8 Fr catheter was tunneled from the port pocket site to the venotomy incision. The port was placed in the pocket. The external catheter was trimmed to appropriate length. At the venotomy, an 8 Fr peel-away sheath was placed over a guidewire under fluoroscopic guidance. The catheter was then placed through the sheath and the sheath was removed. Final catheter positioning was confirmed and documented with a fluoroscopic spot radiograph. The port was accessed with a Huber needle, aspirated and flushed with heparinized saline. The venotomy site was closed with an interrupted 4-0 Vicryl suture. The port pocket incision was closed with interrupted 2-0 Vicryl suture and the skin was opposed with a running subcuticular 4-0 Vicryl suture. Dermabond and Steri-strips were applied to both incisions. Dressings were placed. ____________________________________________ Next, attention was paid towards placement of the gastrostomy tube. The left upper quadrant was sterilely prepped and draped. An oral gastric catheter was inserted into the stomach under fluoroscopy. The existing nasogastric feeding tube was removed. The left costal margin and air / barium opacified transverse colon were  identified and avoided. Air was injected into the stomach for insufflation and visualization under fluoroscopy. Under sterile conditions a 17 gauge trocar needle was utilized to access the stomach percutaneously beneath the left subcostal margin after the overlying soft tissues were anesthetized with 1% Lidocaine with epinephrine. Needle position was confirmed within the stomach with aspiration of air and injection of small amount of contrast. A single T tack was deployed for gastropexy. Over an Amplatz guide wire, a 9-French sheath was inserted into the stomach. A snare device was utilized to capture the oral gastric catheter. The snare device was pulled retrograde from the stomach up the esophagus and out the oropharynx. The 20-French pull-through gastrostomy was connected to the snare device and pulled antegrade through the oropharynx down the esophagus into the stomach and then through the percutaneous tract external to the patient. The gastrostomy was assembled externally. Contrast injection confirms position in the stomach. Several spot radiographic images were obtained in various obliquities for documentation. Dressings were placed. The patient tolerated both above procedures well without immediate post procedural complication. FINDINGS: After catheter placement, the tip lies within the superior cavoatrial junction. The catheter aspirates and flushes normally and is ready for immediate use. After successful fluoroscopic guided placement, the gastrostomy tube is appropriately positioned with internal disc against the ventral aspect of the gastric lumen. IMPRESSION: 1. Successful placement of a right internal jugular approach power injectable Port-A-Cath. The Port a catheter is ready for immediate use. 2. Successful fluoroscopic insertion of a 20-French pull-through gastrostomy tube. The gastrostomy may be used immediately for medication administration and may be utilized in 24 hrs for the initiation of feeds.  Electronically Signed   By: Sandi Mariscal M.D.   On: 05/21/2016 12:48   Ir US Guide Vasc Access Right  Result Date: 05/21/2016 INDICATION: History of head neck cancer, in need of durable intravenous access for chemotherapy administration. In need of enteric access for enteric nutrition supplementation prior to initiation of chemoradiation. EXAM: 1. IMPLANTED PORT A CATH PLACEMENT WITH ULTRASOUND AND FLUOROSCOPIC GUIDANCE 2. FLUOROSCOPIC GUIDED PERCUTANEOUS GASTROSTOMY  TUBE PLACEMENT COMPARISON:  CT the chest, abdomen pelvis - 05/12/2018 MEDICATIONS: Glucagon 1 mg IV; Ancef 2 g IV; The antibiotic was administered within an appropriate time interval prior to skin puncture. ANESTHESIA/SEDATION: Moderate (conscious) sedation was employed during this procedure. A total of Versed 6 mg and Fentanyl 100 mcg was administered intravenously. Moderate Sedation Time: 45 minutes. The patient's level of consciousness and vital signs were monitored continuously by radiology nursing throughout the procedure under my direct supervision. CONTRAST:  20 - cc Isovue 300, administered into the stomach lumen. FLUOROSCOPY TIME:  A total of 1 minute, 54 seconds (38.2 mGy) fluoroscopy time was utilized for both procedures. COMPLICATIONS: None immediate. PROCEDURE: The procedures, risks, benefits, and alternatives were explained to the patient. Questions regarding the procedure were encouraged and answered. The patient understands and consents to both procedures. Attention was initially paid towards placement of the [facility] a Catheter. The right neck and chest were prepped with chlorhexidine in a sterile fashion, and a sterile drape was applied covering the operative field. Maximum barrier sterile technique with sterile gowns and gloves were used for the procedure. A timeout was performed prior to the initiation of the procedure. Local anesthesia was provided with 1% lidocaine with epinephrine. After creating a small venotomy incision, a  micropuncture kit was utilized to access the internal jugular vein under direct, real-time ultrasound guidance. Ultrasound image documentation was performed. The microwire was kinked to measure appropriate catheter length. A subcutaneous port pocket was then created along the upper chest wall utilizing a combination of Cleaves and blunt dissection. The pocket was irrigated with sterile saline. A single lumen thin power injectable port was chosen for placement. The 8 Fr catheter was tunneled from the port pocket site to the venotomy incision. The port was placed in the pocket. The external catheter was trimmed to appropriate length. At the venotomy, an 8 Fr peel-away sheath was placed over a guidewire under fluoroscopic guidance. The catheter was then placed through the sheath and the sheath was removed. Final catheter positioning was confirmed and documented with a fluoroscopic spot radiograph. The port was accessed with a Huber needle, aspirated and flushed with heparinized saline. The venotomy site was closed with an interrupted 4-0 Vicryl suture. The port pocket incision was closed with interrupted 2-0 Vicryl suture and the skin was opposed with a running subcuticular 4-0 Vicryl suture. Dermabond and Steri-strips were applied to both incisions. Dressings were placed. ____________________________________________ Next, attention was paid towards placement of the gastrostomy tube. The left upper quadrant was sterilely prepped and draped. An oral gastric catheter was inserted into the stomach under fluoroscopy. The existing nasogastric feeding tube was removed. The left costal margin and air / barium opacified transverse colon were identified and avoided. Air was injected into the stomach for insufflation and visualization under fluoroscopy. Under sterile conditions a 17 gauge trocar needle was utilized to access the stomach percutaneously beneath the left subcostal margin after the overlying soft tissues were  anesthetized with 1% Lidocaine with epinephrine. Needle position was confirmed within the stomach with aspiration of air and injection of small amount of contrast. A single T tack was deployed for gastropexy. Over an Amplatz guide wire, a 9-French sheath was inserted into the stomach. A snare device was utilized to capture the oral gastric catheter. The snare device was pulled retrograde from the stomach up the esophagus and out the oropharynx. The 20-French pull-through gastrostomy was connected to the snare device and pulled antegrade through the oropharynx down the esophagus into  the stomach and then through the percutaneous tract external to the patient. The gastrostomy was assembled externally. Contrast injection confirms position in the stomach. Several spot radiographic images were obtained in various obliquities for documentation. Dressings were placed. The patient tolerated both above procedures well without immediate post procedural complication. FINDINGS: After catheter placement, the tip lies within the superior cavoatrial junction. The catheter aspirates and flushes normally and is ready for immediate use. After successful fluoroscopic guided placement, the gastrostomy tube is appropriately positioned with internal disc against the ventral aspect of the gastric lumen. IMPRESSION: 1. Successful placement of a right internal jugular approach power injectable Port-A-Cath. The Port a catheter is ready for immediate use. 2. Successful fluoroscopic insertion of a 20-French pull-through gastrostomy tube. The gastrostomy may be used immediately for medication administration and may be utilized in 24 hrs for the initiation of feeds. Electronically Signed   By: Sandi Mariscal M.D.   On: 05/21/2016 12:48   Dg Chest Port 1 View  Result Date: 05/21/2016 CLINICAL DATA:  Shortness of Breath EXAM: PORTABLE CHEST 1 VIEW COMPARISON:  05/11/2016 FINDINGS: Cardiac shadow is within normal limits. Postoperative changes are  seen. Right chest wall port is noted. No focal infiltrate or sizable effusion is seen. No acute bony abnormality is noted. IMPRESSION: No acute abnormality seen. Electronically Signed   By: [name] M.D.   On: 05/21/2016 17:17   Dg Abd Portable 1 View  Result Date: 05/21/2016 CLINICAL DATA:  Abdominal distension and recent G-tube placement EXAM: PORTABLE ABDOMEN - 1 VIEW COMPARISON:  None. FINDINGS: Scattered large and small bowel gas is noted. The small bowel loops are mildly prominent likely representing an ileus. Gastrostomy catheter is noted just to the left of the midline. No other focal abnormality is seen. IMPRESSION: Changes most consistent with a small-bowel ileus. Although not mentioned in the body of the report there may be a small amount of free intraperitoneal air likely related to the recent gastrostomy placement. Electronically Signed   By: [name] M.D.   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 In need of enteric access for enteric nutrition supplementation prior to initiation of chemoradiation. EXAM: 1. IMPLANTED PORT A CATH PLACEMENT WITH ULTRASOUND AND FLUOROSCOPIC GUIDANCE 2. FLUOROSCOPIC GUIDED PERCUTANEOUS GASTROSTOMY TUBE PLACEMENT COMPARISON:  CT the chest, abdomen pelvis - 05/12/2018 MEDICATIONS: Glucagon 1 mg IV; Ancef 2 g IV; The antibiotic was administered within an appropriate time interval prior to skin puncture. ANESTHESIA/SEDATION: Moderate (conscious) sedation was employed during this procedure. A total of Versed 6 mg and Fentanyl 100 mcg was administered intravenously. Moderate Sedation Time: 45 minutes. The patient's level of consciousness and vital signs were monitored continuously by radiology nursing throughout the procedure under my direct supervision. CONTRAST:  20 - cc Isovue 300, administered into the  stomach lumen. FLUOROSCOPY TIME:  A total of 1 minute, 54 seconds (38.2 mGy) fluoroscopy time was utilized for both procedures. COMPLICATIONS: None immediate. PROCEDURE: The procedures, risks, benefits, and alternatives were explained to the patient. Questions regarding the procedure were encouraged and answered. The patient understands and consents to both procedures. Attention was initially paid towards placement of the [facility] a Catheter. The right neck and chest were prepped with chlorhexidine in a sterile fashion, and a sterile drape was applied covering the operative field. Maximum barrier sterile technique with sterile gowns and gloves were used  for the procedure. A timeout was performed prior to the initiation of the procedure. Local anesthesia was provided with 1% lidocaine with epinephrine. After creating a small venotomy incision, a micropuncture kit was utilized to access the internal jugular vein under direct, real-time ultrasound guidance. Ultrasound image documentation was performed. The microwire was kinked to measure appropriate catheter length. A subcutaneous port pocket was then created along the upper chest wall utilizing a combination of [place] and blunt dissection. The pocket was irrigated with sterile saline. A single lumen thin power injectable port was chosen for placement. The 8 Fr catheter was tunneled from the port pocket site to the venotomy incision. The port was placed in the pocket. The external catheter was trimmed to appropriate length. At the venotomy, an 8 Fr peel-away sheath was placed over a guidewire under fluoroscopic guidance. The catheter was then placed through the sheath and the sheath was removed. Final catheter positioning was confirmed and documented with a fluoroscopic spot radiograph. The port was accessed with a Huber needle, aspirated and flushed with heparinized saline. The venotomy site was closed with an interrupted 4-0 Vicryl suture. The port pocket incision was  closed with interrupted 2-0 Vicryl suture and the skin was opposed with a running subcuticular 4-0 Vicryl suture. Dermabond and Steri-strips were applied to both incisions. Dressings were placed. ____________________________________________ Next, attention was paid towards placement of the gastrostomy tube. The left upper quadrant was sterilely prepped and draped. An oral gastric catheter was inserted into the stomach under fluoroscopy. The existing nasogastric feeding tube was removed. The left costal margin and air / barium opacified transverse colon were identified and avoided. Air was injected into the stomach for insufflation and visualization under fluoroscopy. Under sterile conditions a 17 gauge trocar needle was utilized to access the stomach percutaneously beneath the left subcostal margin after the overlying soft tissues were anesthetized with 1% Lidocaine with epinephrine. Needle position was confirmed within the stomach with aspiration of air and injection of small amount of contrast. A single T tack was deployed for gastropexy. Over an Amplatz guide wire, a 9-French sheath was inserted into the stomach. A snare device was utilized to capture the oral gastric catheter. The snare device was pulled retrograde from the stomach up the esophagus and out the oropharynx. The 20-French pull-through gastrostomy was connected to the snare device and pulled antegrade through the oropharynx down the esophagus into the stomach and then through the percutaneous tract external to the patient. The gastrostomy was assembled externally. Contrast injection confirms position in the stomach. Several spot radiographic images were obtained in various obliquities for documentation. Dressings were placed. The patient tolerated both above procedures well without immediate post procedural complication. FINDINGS: After catheter placement, the tip lies within the superior cavoatrial junction. The catheter aspirates and flushes  normally and is ready for immediate use. After successful fluoroscopic guided placement, the gastrostomy tube is appropriately positioned with internal disc against the ventral aspect of the gastric lumen. IMPRESSION: 1. Successful placement of a right internal jugular approach power injectable Port-A-Cath. The Port a catheter is ready for immediate use. 2. Successful fluoroscopic insertion of a 20-French pull-through gastrostomy tube. The gastrostomy may be used immediately for medication administration and may be utilized in 24 hrs for the initiation of feeds. Electronically Signed   By: Sandi Mariscal M.D.   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 without major side effects We will proceed with  treatment without dose adjustment I will see him on a weekly basis for supportive care  Acute hyperkalemia He had recent hyperkalemia for some unknown reason He had significant diarrhea/bowel movement yesterday after Kayexalate We will monitor carefully We will proceed with treatment tomorrow without dose adjustment  I have discussed with the pharmacist to omit potassium chloride in the pre- and post-hydration fluids tomorrow  Weight loss He had recent weight loss could be secondary to Kayexalate We will monitor his weight carefully I recommend increase oral intake as tolerated   No orders of the defined types were placed in this encounter.  All questions were answered. The patient knows to call the clinic with any problems, questions or concerns. No barriers to learning was detected. I spent 15 minutes counseling the patient face to face. The total time spent in the appointment was 20 minutes and more than 50% was on counseling and review of test results     [name], MD 05/31/2016 2:51 PM

## 2016-05-31 NOTE — Assessment & Plan Note (Signed)
He had recent weight loss could be secondary to Kayexalate We will monitor his weight carefully I recommend increase oral intake as tolerated

## 2016-05-31 NOTE — Telephone Encounter (Signed)
Gave patient wife avs report and appointments for June.

## 2016-06-01 ENCOUNTER — Ambulatory Visit: Payer: PPO | Admitting: Nutrition

## 2016-06-01 ENCOUNTER — Ambulatory Visit
Admission: RE | Admit: 2016-06-01 | Discharge: 2016-06-01 | Disposition: A | Discharge status: Home / Self Care | Payer: PPO | Source: Ambulatory Visit | Attending: Radiation Oncology | Admitting: Radiation Oncology

## 2016-06-01 ENCOUNTER — Ambulatory Visit (HOSPITAL_BASED_OUTPATIENT_CLINIC_OR_DEPARTMENT_OTHER): Payer: PPO

## 2016-06-01 VITALS — BP 122/57 | HR 58 | Temp 98.1°F | Resp 17

## 2016-06-01 DIAGNOSIS — R011 Cardiac murmur, unspecified: Secondary | ICD-10-CM | POA: Diagnosis not present

## 2016-06-01 DIAGNOSIS — Z5111 Encounter for antineoplastic chemotherapy: Secondary | ICD-10-CM

## 2016-06-01 DIAGNOSIS — Z510 Encounter for antineoplastic radiation therapy: Secondary | ICD-10-CM | POA: Diagnosis not present

## 2016-06-01 DIAGNOSIS — C060 Malignant neoplasm of cheek mucosa: Secondary | ICD-10-CM

## 2016-06-01 MED ORDER — SODIUM CHLORIDE 0.9% FLUSH
10.0000 mL | INTRAVENOUS | Status: DC | PRN
  Administered 2016-06-01: 10 mL
  Filled 2016-06-01: qty 10

## 2016-06-01 MED ORDER — MAGNESIUM SULFATE 50 % IJ SOLN
Freq: Once | INTRAVENOUS | Status: AC
  Administered 2016-06-01: 10:00:00 via INTRAVENOUS
  Filled 2016-06-01: qty 3

## 2016-06-01 MED ORDER — SODIUM CHLORIDE 0.9 % IV SOLN
Freq: Once | INTRAVENOUS | Status: AC
  Administered 2016-06-01: 12:00:00 via INTRAVENOUS
  Filled 2016-06-01: qty 5

## 2016-06-01 MED ORDER — SODIUM CHLORIDE 0.9 % IV SOLN
Freq: Once | INTRAVENOUS | Status: AC
  Administered 2016-06-01: 10:00:00 via INTRAVENOUS

## 2016-06-01 MED ORDER — PALONOSETRON HCL INJECTION 0.25 MG/5ML
INTRAVENOUS | Status: AC
  Filled 2016-06-01: qty 5

## 2016-06-01 MED ORDER — SODIUM CHLORIDE 0.9 % IV SOLN
30.0000 mg/m2 | Freq: Once | INTRAVENOUS | Status: AC
  Administered 2016-06-01: 60 mg via INTRAVENOUS
  Filled 2016-06-01: qty 60

## 2016-06-01 MED ORDER — PALONOSETRON HCL INJECTION 0.25 MG/5ML
0.2500 mg | Freq: Once | INTRAVENOUS | Status: AC
  Administered 2016-06-01: 0.25 mg via INTRAVENOUS

## 2016-06-01 MED ORDER — HEPARIN SOD (PORK) LOCK FLUSH 100 UNIT/ML IV SOLN
500.0000 [IU] | Freq: Once | INTRAVENOUS | Status: AC | PRN
  Administered 2016-06-01: 500 [IU]
  Filled 2016-06-01: qty 5

## 2016-06-01 NOTE — Patient Instructions (Signed)
[facility] Discharge Instructions for Patients Receiving Chemotherapy  Today you received the following chemotherapy agents Cisplatin To help prevent nausea and vomiting after your treatment, we encourage you to take your nausea medication as prescribed.    If you develop nausea and vomiting that is not controlled by your nausea medication, call the clinic.   BELOW ARE SYMPTOMS THAT SHOULD BE REPORTED IMMEDIATELY:  *FEVER GREATER THAN 100.5 F  *CHILLS WITH OR WITHOUT FEVER  NAUSEA AND VOMITING THAT IS NOT CONTROLLED WITH YOUR NAUSEA MEDICATION  *UNUSUAL SHORTNESS OF BREATH  *UNUSUAL BRUISING OR BLEEDING  TENDERNESS IN MOUTH AND THROAT WITH OR WITHOUT PRESENCE OF ULCERS  *URINARY PROBLEMS  *BOWEL PROBLEMS  UNUSUAL RASH Items with * indicate a potential emergency and should be followed up as soon as possible.  Feel free to call the clinic you have any questions or concerns. The clinic phone number is [phone].  Please show the CHEMO ALERT CARD at check-in to the Emergency Department and triage nurse.   

## 2016-06-01 NOTE — Progress Notes (Signed)
76 year old male diagnosed with left buccal mucosa cancer receiving weekly cisplatin and adjuvant radiation therapy. Patient received feeding tube on May 21.  Past medical history includes tobacco, alcohol, and CABG.  Medications include Lipitor, vitamin B12, magnesium oxide, Glucophage  Labs include potassium 5.5, glucose 152, albumin 3.8 on the 30th.  Height: 5 feet 8 inches. Weight: 183.3 pounds. Usual body weight: 201 pounds in January 2018 BMI: 27.07.  Patient reports appetite is fair.  He finds it difficult find foods to eat. Reports he can chew and swallow without difficulty Patient denies nutrition impact symptoms at this time.  Nutrition diagnosis:  Unintentional weight loss related to new diagnosis of buccal mucosa cancer as evidenced by 25 pound weight loss from usual body weight.  Intervention: Educated patient on the importance of small frequent meals and snacks utilizing high protein foods. Recommended patient begin oral nutrition supplements twice a day Reviewed importance of flushing his feeding tube with water daily. Provided fact sheets and contact information.  Questions were answered.  Teach back method was used.  Monitoring, evaluation, goals: Patient will tolerate adequate calories and protein to minimize weight loss throughout treatment.  Next visit: To be scheduled weekly with treatment.  **Disclaimer: This note was dictated with voice recognition software. Similar sounding words can inadvertently be transcribed and this note may contain transcription errors which may not have been corrected upon publication of note.**

## 2016-06-04 ENCOUNTER — Encounter: Payer: Self-pay | Admitting: Radiation Oncology

## 2016-06-04 ENCOUNTER — Ambulatory Visit: Payer: PPO

## 2016-06-04 ENCOUNTER — Ambulatory Visit: Payer: PPO | Admitting: Radiation Oncology

## 2016-06-04 ENCOUNTER — Ambulatory Visit: Payer: PPO | Attending: Radiation Oncology

## 2016-06-04 ENCOUNTER — Ambulatory Visit
Admission: RE | Admit: 2016-06-04 | Discharge: 2016-06-04 | Disposition: A | Discharge status: Home / Self Care | Payer: PPO | Source: Ambulatory Visit | Attending: Radiation Oncology | Admitting: Radiation Oncology

## 2016-06-04 ENCOUNTER — Ambulatory Visit: Payer: PPO | Admitting: Physician Assistant

## 2016-06-04 VITALS — BP 115/53 | HR 58 | Temp 98.3°F | Ht 69.0 in | Wt 184.4 lb

## 2016-06-04 DIAGNOSIS — R1310 Dysphagia, unspecified: Secondary | ICD-10-CM | POA: Insufficient documentation

## 2016-06-04 DIAGNOSIS — C060 Malignant neoplasm of cheek mucosa: Secondary | ICD-10-CM

## 2016-06-04 DIAGNOSIS — R293 Abnormal posture: Secondary | ICD-10-CM | POA: Insufficient documentation

## 2016-06-04 DIAGNOSIS — L599 Disorder of the skin and subcutaneous tissue related to radiation, unspecified: Secondary | ICD-10-CM | POA: Diagnosis not present

## 2016-06-04 DIAGNOSIS — Z483 Aftercare following surgery for neoplasm: Secondary | ICD-10-CM | POA: Insufficient documentation

## 2016-06-04 DIAGNOSIS — R011 Cardiac murmur, unspecified: Secondary | ICD-10-CM | POA: Diagnosis not present

## 2016-06-04 DIAGNOSIS — Z510 Encounter for antineoplastic radiation therapy: Secondary | ICD-10-CM | POA: Diagnosis not present

## 2016-06-04 MED ORDER — LIDOCAINE VISCOUS 2 % MT SOLN: OROMUCOSAL | 5 refills | Status: DC

## 2016-06-04 NOTE — Progress Notes (Signed)
Andres Smith presents for his 8th fraction of radiation to his Head and neck, Left Buccal Left cheek. He denies pain. He is eating well at this time. He is not using his feeding tube except to flush. His PEG has some yellow drainage on the gauze. He denies pain at the site. His neck has some mild redness to his neck. He is using biafine 1-2 times daily. He reports some inflammation to his left cheek and back of his throat.   BP (!) 115/53   Pulse (!) 58   Temp 98.3 F (36.8 C)   Ht 5\' 9"  (1.753 m)   Wt 184 lb 6.4 oz (83.6 kg)   SpO2 100% Comment: room air  BMI 27.23 kg/m    Wt Readings from Last 3 Encounters:  06/04/16 184 lb 6.4 oz (83.6 kg)  05/31/16 183 lb 4.8 oz (83.1 kg)  05/29/16 187 lb (84.8 kg)

## 2016-06-04 NOTE — Progress Notes (Signed)
   Weekly Management Note:  Outpatient    ICD-9-CM ICD-10-CM   1. Buccal mucosa squamous cell carcinoma (HCC) 145.0 C06.0     Current Dose: 16 Gy  Projected Dose: 60 Gy   Narrative:  The patient presents for routine under treatment assessment.  CBCT/MVCT images/Port film x-rays were reviewed.  The chart was checked.    The patient denies pain.  Eating well.  Some mild throat/ mouth irritation.   Physical Findings:  height is 5\' 9"  (1.753 m) and weight is 184 lb 6.4 oz (83.6 kg). His temperature is 98.3 F (36.8 C). His blood pressure is 115/53 (abnormal) and his pulse is 58 (abnormal). His oxygen saturation is 100%.   Wt Readings from Last 3 Encounters:  06/04/16 184 lb 6.4 oz (83.6 kg)  05/31/16 183 lb 4.8 oz (83.1 kg)  05/29/16 187 lb (84.8 kg)   No mucositis in the mouth. No oral thrush. Mild irritation of left lip.  Impression:  The patient is tolerating radiotherapy.  Plan:  Continue radiotherapy as planned. The patient will apply Radiaplex to the skin within the treatment field as directed. Rx lidocaine today for mucosa. Samples of aquaphor given for lip.  ________________________________   [name], M.D.

## 2016-06-04 NOTE — Therapy (Signed)
[facility] [address] [place], [place], [address] Phone: [phone]   Fax:  [phone]  Speech Language Pathology Evaluation  Patient Details  Name: Andres Smith MRN: [id number] Date of Birth: 1940-11-06 Referring Provider: [name], MD  Encounter Date: 06/04/2016      End of Session - 06/04/16 1436    Visit Number 1   Number of Visits 3   Date for SLP Re-Evaluation 08/24/16   SLP Start Time 5643   SLP Stop Time  1505   SLP Time Calculation (min) 40 min   Activity Tolerance Patient tolerated treatment well      Past Medical History:  Diagnosis Date  . Ankylosing spondylitis ([place])   . Blood transfusion    hx of transfusion without reaction  . Bradycardia   . Cancer ([place]) 03/2016   oral  . Cholelithiasis   . Coronary artery disease    a. s/p CABG x 6 in 1997 (LIMA->LAD, VG->RI ->OM1->OM2, VG->PDA->PLV // b. 05/2011 Cath:  patent grafs, native prox rca and d2 dzs  ->med rx. // c. Myoview 1/18: not gated, large inferolateral scar, no ischemia; Intermediate Risk (IL scar old - on prior studies >> med rx)  . Crohn's colitis ([place]) 12/01/1997  . Diabetes mellitus   . Dysphagia   . Fatty liver   . GERD (gastroesophageal reflux disease)   . Heart block   . Hiatal hernia 12/03/2008  . History of skin cancer   . Hx of echocardiogram    Echo 5/14:  Mild LVH, EF 55-60%, NL diast fxn, mild LAE   . Hyperlipidemia   . Hypertension   . Kidney stones   . Neck pain   . Pneumonia    hx of PNA  . 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 cancer removal  2014   removed from left outter leg  . COLONOSCOPY  2012  . CORONARY ARTERY BYPASS GRAFT  04/19/1995   x7  . EXCISION ORAL TUMOR Left    left jaw and lymph node  . HERNIA REPAIR  1989  . IR FLUORO GUIDE PORT INSERTION RIGHT  05/21/2016  . IR GASTROSTOMY TUBE MOD SED  05/21/2016  . IR US  GUIDE VASC ACCESS RIGHT  05/21/2016  . LEFT HEART CATHETERIZATION WITH CORONARY/GRAFT ANGIOGRAM N/A 05/24/2011   Procedure: LEFT HEART CATHETERIZATION WITH Beatrix Fetters;  Surgeon: Larey Dresser, MD;  Location: [facility]ATH [facility];  Service: Cardiovascular;  Laterality: N/A;  . MOHS SURGERY     X 2 off chin and nose  . TONSILLECTOMY AND ADENOIDECTOMY  1944  . TRIGGER FINGER RELEASE    . UMBILICAL HERNIA REPAIR      There were no vitals filed for this visit.          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 [name]. Rad to tumor bed and bil neck (1/4 nodes cancerous) and chemo (rad began 05-23-16, chemo began 05-25-16)  PEG placed 05-21-16.     Prior Functional Status   Cognitive/Linguistic Baseline Within functional limits     Cognition   Overall Cognitive Status  Within Functional Limits for tasks assessed     Auditory Comprehension   Overall Auditory Comprehension Appears within functional limits for tasks assessed     Verbal Expression   Overall Verbal Expression Appears within functional limits for tasks assessed     Oral Motor/Sensory Function   Overall Oral Motor/Sensory Function Impaired   Labial ROM Reduced left   Labial Symmetry Abnormal symmetry left   Labial Strength Reduced Left   Labial Coordination WFL   Lingual Symmetry Within Functional Limits   Lingual Strength Within Functional Limits   Lingual Coordination WFL   Facial ROM Within Functional Limits   Velum Within Functional Limits  hard to visusalize with reduced labial opening post-sx     Motor Speech   Overall Motor Speech Appears within functional limits for tasks assessed      Pt currently tolerates soft foods/thin liquids. POs: Pt ate  cheese stick and drank H2O without overt s/s aspiration. Thyroid elevation appeared adequate, and swallows appeared timely. Oral residue noted as minimal if pt chewing on rt, on lt, pt reports occasional- usual buccal residue. Pt's swallow deemed WFL at this time with some mild degree of dysphagia suspected due to oral surgery.   Because data states the risk for dysphagia during and after radiation treatment is high due to undergoing radiation tx, SLP taught pt about the possibility of reduced/limited ability for PO intake during rad tx. SLP encouraged pt to continue swallowing POs as far into rad tx as possible, even ingesting POs and/or completing HEP shortly after administration of pain meds.   SLP educated pt re: changes to swallowing musculature after rad tx, and why adherence to dysphagia HEP provided today and PO consumption was necessary to inhibit muscular disuse atrophy and to reduce muscle fibrosis following rad tx. Pt demonstrated understanding of these things to SLP.    SLP then developed a HEP for pt and pt was instructed how to perform exercises involving lingual, vocal, and pharyngeal strengthening. SLP performed each exercise and pt return demonstrated each exercise. SLP ensured pt performance was correct prior to moving on to next exercise. Pt was instructed to complete this program 2 times a day until 6 months after his last rad tx, then x2-3 a week after that.                     SLP Education - 06/04/16 1435    Education provided Yes   Education Details HEP, late effects head/neck CA   Person(s) Educated Patient;Spouse   Methods Explanation;Demonstration;Verbal cues;Handout   Comprehension Verbalized understanding;Returned demonstration;Verbal cues required;Need further instruction          SLP Short Term Goals - 06/04/16 1528      SLP SHORT TERM GOAL #1   Title pt will complete HEP with min A    Time 1   Period --  visit     SLP SHORT TERM GOAL #2    Title pt will tell SLP why he is completing HEP   Time 1   Period --  visit          SLP Long Term Goals - 06/04/16 1529      SLP LONG TERM GOAL #1   Title pt will complete HEP with modified independence   Time 2   Period --  visits   Status New     SLP LONG TERM GOAL #2   Title pt will tell SLP 3 overt s/s aspiration PNA with modified  independence   Time 2   Period --  visits   Status New     SLP LONG TERM GOAL #3   Title pt will tell SLP why a food journal is helpful in returning to most liberal diet   Time 2   Period --  visits   Status New          Plan - 06/12/2016 1440    Clinical Impression Statement Pt with oropharyngeal swallowing essentially WNL, however the probability of swallowing difficulty increases dramatically with the initiation of chemo and radiation therapy. Pt will need to be followed by SLP for regular assessment of accurate HEP completion as well as for safety with POs both during and following treatment/s.   Speech Therapy Frequency --  approx once every 4 weeks   Duration --  3 visits   Treatment/Interventions Aspiration precaution training;Pharyngeal strengthening exercises;Diet toleration management by SLP;Compensatory techniques;Functional tasks;Patient/family education;SLP instruction and feedback   Potential to [place] provided today   Consulted and Agree with Plan of Care Patient      Patient will benefit from skilled therapeutic intervention in order to improve the following deficits and impairments:   Dysphagia, unspecified type      G-Codes - Jun 12, 2016 1530    Functional Assessment Tool Used noms, clinical judgement   Functional Limitations Swallowing   Swallow Current Status (E4235) At least 1 percent but less than 20 percent impaired, limited or restricted   Swallow Goal Status (T6144) At least 1 percent but less than 20 percent impaired, limited or restricted      Problem List Patient  Active Problem List   Diagnosis Date Noted  . Acute hyperkalemia 05/31/2016  . Weight loss 05/31/2016  . 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 B12 deficiency anemia 08/03/2013  . ULCERATIVE COLITIS-UNIVERSAL 01/17/2010  . Occlusion and stenosis of carotid artery without mention of cerebral infarction 09/20/2009  . GERD 12/02/2008  . Type II diabetes mellitus with manifestations ([place]) 04/27/2008  . Hyperlipidemia with target LDL less than 70 12/04/2007  . Essential hypertension, benign 12/04/2007  . Ankylosing spondylitis ([place]) 12/04/2007  . Coronary atherosclerosis of native coronary artery 12/04/2007  . [facility] INTESTINE 05/06/2007    [facility] ,[place], [place]  12-Jun-2016, 3:31 PM  [facility] [address] [place], [place], [address] Phone: [phone]   Fax:  [phone]  Name: Andres Smith MRN: [id number] Date of Birth: 1940/04/16

## 2016-06-04 NOTE — Patient Instructions (Signed)
SWALLOWING EXERCISES Do these 6 of the 7 days per week until 6 months after your last day of radiation, then 3 times per week afterwards  1. Effortful Swallows - Press your tongue against the roof of your mouth for 3 seconds, then squeeze          the muscles in your neck while you swallow your saliva or a sip of water - Repeat 20 times, 2-3 times a day, and use whenever you eat or drink  2. Masako Swallow - swallow with your tongue sticking out - Stick tongue out past your teeth and gently bite tongue with your teeth - Swallow, while holding your tongue with your teeth - Repeat 20 times, 2-3 times a day *use a wet spoon if your mouth gets dry*  3. Pitch Raise - Repeat "he", once per second in as high of a pitch as you can - Repeat 20 times, 2-3 times a day  4. Mendelsohn Maneuver - "half swallow" exercise - Start to swallow, and keep your Adam's apple up by squeezing hard with the            muscles of the throat - Hold the squeeze for 5-7 seconds and then relax - Repeat 20 times, 2-3 times a day *use a wet spoon if your mouth gets dry*  5. Breath Hold - Say "HUH!" loudly, then hold your breath for 3 seconds at your voice box - Repeat 20 times, 2-3 times a day  6. Chin pushback - Open your mouth  - Place your fist UNDER your chin near your neck, and push back with your fist for 5 seconds - Repeat 10 times, 2-3 times a day

## 2016-06-05 ENCOUNTER — Ambulatory Visit
Admission: RE | Admit: 2016-06-05 | Discharge: 2016-06-05 | Disposition: A | Discharge status: Home / Self Care | Payer: PPO | Source: Ambulatory Visit | Attending: Radiation Oncology | Admitting: Radiation Oncology

## 2016-06-05 ENCOUNTER — Ambulatory Visit: Payer: PPO

## 2016-06-05 DIAGNOSIS — R011 Cardiac murmur, unspecified: Secondary | ICD-10-CM | POA: Diagnosis not present

## 2016-06-05 DIAGNOSIS — C060 Malignant neoplasm of cheek mucosa: Secondary | ICD-10-CM | POA: Diagnosis not present

## 2016-06-05 DIAGNOSIS — Z510 Encounter for antineoplastic radiation therapy: Secondary | ICD-10-CM | POA: Diagnosis not present

## 2016-06-06 ENCOUNTER — Encounter: Payer: Self-pay | Admitting: Nutrition

## 2016-06-06 ENCOUNTER — Ambulatory Visit
Admission: RE | Admit: 2016-06-06 | Discharge: 2016-06-06 | Disposition: A | Discharge status: Home / Self Care | Payer: PPO | Source: Ambulatory Visit | Attending: Radiation Oncology | Admitting: Radiation Oncology

## 2016-06-06 ENCOUNTER — Encounter: Payer: PPO | Admitting: Nutrition

## 2016-06-06 ENCOUNTER — Other Ambulatory Visit: Payer: Self-pay | Admitting: *Deleted

## 2016-06-06 ENCOUNTER — Other Ambulatory Visit (HOSPITAL_BASED_OUTPATIENT_CLINIC_OR_DEPARTMENT_OTHER): Payer: PPO

## 2016-06-06 ENCOUNTER — Telehealth: Payer: Self-pay | Admitting: *Deleted

## 2016-06-06 DIAGNOSIS — Z1329 Encounter for screening for other suspected endocrine disorder: Secondary | ICD-10-CM

## 2016-06-06 DIAGNOSIS — C060 Malignant neoplasm of cheek mucosa: Secondary | ICD-10-CM

## 2016-06-06 DIAGNOSIS — R011 Cardiac murmur, unspecified: Secondary | ICD-10-CM | POA: Diagnosis not present

## 2016-06-06 DIAGNOSIS — Z510 Encounter for antineoplastic radiation therapy: Secondary | ICD-10-CM | POA: Diagnosis not present

## 2016-06-06 LAB — CBC WITH DIFFERENTIAL/PLATELET
BASO%: 0.2 % (ref 0.0–2.0)
Basophils Absolute: 0 10*3/uL (ref 0.0–0.1)
EOS%: 0.9 % (ref 0.0–7.0)
Eosinophils Absolute: 0.1 10*3/uL (ref 0.0–0.5)
HCT: 34.3 % — ABNORMAL LOW (ref 38.4–49.9)
HGB: 11.6 g/dL — ABNORMAL LOW (ref 13.0–17.1)
LYMPH%: 5.7 % — AB (ref 14.0–49.0)
MCH: 36.6 pg — ABNORMAL HIGH (ref 27.2–33.4)
MCHC: 34 g/dL (ref 32.0–36.0)
MCV: 107.7 fL — ABNORMAL HIGH (ref 79.3–98.0)
MONO#: 0.8 10*3/uL (ref 0.1–0.9)
MONO%: 9.6 % (ref 0.0–14.0)
NEUT#: 6.6 10*3/uL — ABNORMAL HIGH (ref 1.5–6.5)
NEUT%: 83.6 % — AB (ref 39.0–75.0)
PLATELETS: 158 10*3/uL (ref 140–400)
RBC: 3.18 10*6/uL — AB (ref 4.20–5.82)
RDW: 15.2 % — ABNORMAL HIGH (ref 11.0–14.6)
WBC: 8 10*3/uL (ref 4.0–10.3)
lymph#: 0.5 10*3/uL — ABNORMAL LOW (ref 0.9–3.3)

## 2016-06-06 LAB — COMPREHENSIVE METABOLIC PANEL
ALBUMIN: 3.9 g/dL (ref 3.5–5.0)
ALK PHOS: 60 U/L (ref 40–150)
ALT: 12 U/L (ref 0–55)
AST: 11 U/L (ref 5–34)
Anion Gap: 7 mEq/L (ref 3–11)
BILIRUBIN TOTAL: 0.65 mg/dL (ref 0.20–1.20)
BUN: 21 mg/dL (ref 7.0–26.0)
CO2: 29 mEq/L (ref 22–29)
CREATININE: 1 mg/dL (ref 0.7–1.3)
Calcium: 9.9 mg/dL (ref 8.4–10.4)
Chloride: 101 mEq/L (ref 98–109)
EGFR: 77 mL/min/{1.73_m2} — ABNORMAL LOW (ref >90)
GLUCOSE: 180 mg/dL — AB (ref 70–140)
POTASSIUM: 5.4 meq/L — AB (ref 3.5–5.1)
SODIUM: 137 meq/L (ref 136–145)
TOTAL PROTEIN: 7 g/dL (ref 6.4–8.3)

## 2016-06-06 LAB — TSH: TSH: 0.781 m(IU)/L (ref 0.320–4.118)

## 2016-06-06 LAB — MAGNESIUM: Magnesium: 2.1 mg/dl (ref 1.5–2.5)

## 2016-06-06 MED ORDER — SPS 15 GM/60ML PO SUSP: ORAL | 0 refills | Status: DC

## 2016-06-06 NOTE — Telephone Encounter (Signed)
-----   Message from [name], MD sent at 06/06/2016  3:27 PM EDT ----- Regarding: high potassium pls call in kayexalate 30 g PO x 1 And bring all meds for review tomorrow ----- Message ----- From: Interface, Lab In Three Zero One Sent: 06/06/2016   2:59 PM To: [name], MD

## 2016-06-06 NOTE — Telephone Encounter (Signed)
Notified of message below. Verbalized understanding 

## 2016-06-06 NOTE — Progress Notes (Signed)
Patient did not show up for nutrition appointment. 

## 2016-06-07 ENCOUNTER — Ambulatory Visit (HOSPITAL_COMMUNITY): Payer: PPO | Attending: Cardiology

## 2016-06-07 ENCOUNTER — Ambulatory Visit (HOSPITAL_BASED_OUTPATIENT_CLINIC_OR_DEPARTMENT_OTHER): Payer: PPO | Admitting: Hematology and Oncology

## 2016-06-07 ENCOUNTER — Encounter: Payer: Self-pay | Admitting: Hematology and Oncology

## 2016-06-07 ENCOUNTER — Other Ambulatory Visit: Payer: Self-pay | Admitting: Hematology and Oncology

## 2016-06-07 ENCOUNTER — Ambulatory Visit: Payer: PPO

## 2016-06-07 VITALS — BP 125/104 | HR 74 | Temp 98.5°F | Resp 18 | Ht 69.0 in | Wt 183.3 lb

## 2016-06-07 DIAGNOSIS — I080 Rheumatic disorders of both mitral and aortic valves: Secondary | ICD-10-CM | POA: Diagnosis not present

## 2016-06-07 DIAGNOSIS — L89891 Pressure ulcer of other site, stage 1: Secondary | ICD-10-CM

## 2016-06-07 DIAGNOSIS — R55 Syncope and collapse: Secondary | ICD-10-CM | POA: Diagnosis not present

## 2016-06-07 DIAGNOSIS — R634 Abnormal weight loss: Secondary | ICD-10-CM | POA: Diagnosis not present

## 2016-06-07 DIAGNOSIS — Z8249 Family history of ischemic heart disease and other diseases of the circulatory system: Secondary | ICD-10-CM | POA: Diagnosis not present

## 2016-06-07 DIAGNOSIS — R42 Dizziness and giddiness: Secondary | ICD-10-CM | POA: Diagnosis not present

## 2016-06-07 DIAGNOSIS — L8991 Pressure ulcer of unspecified site, stage 1: Secondary | ICD-10-CM | POA: Insufficient documentation

## 2016-06-07 DIAGNOSIS — H903 Sensorineural hearing loss, bilateral: Secondary | ICD-10-CM | POA: Diagnosis not present

## 2016-06-07 DIAGNOSIS — C060 Malignant neoplasm of cheek mucosa: Secondary | ICD-10-CM | POA: Diagnosis not present

## 2016-06-07 DIAGNOSIS — K5909 Other constipation: Secondary | ICD-10-CM | POA: Diagnosis not present

## 2016-06-07 DIAGNOSIS — B370 Candidal stomatitis: Secondary | ICD-10-CM | POA: Diagnosis not present

## 2016-06-07 DIAGNOSIS — E875 Hyperkalemia: Secondary | ICD-10-CM

## 2016-06-07 DIAGNOSIS — I10 Essential (primary) hypertension: Secondary | ICD-10-CM | POA: Insufficient documentation

## 2016-06-07 DIAGNOSIS — R001 Bradycardia, unspecified: Secondary | ICD-10-CM | POA: Diagnosis not present

## 2016-06-07 DIAGNOSIS — E119 Type 2 diabetes mellitus without complications: Secondary | ICD-10-CM | POA: Diagnosis not present

## 2016-06-07 DIAGNOSIS — I2510 Atherosclerotic heart disease of native coronary artery without angina pectoris: Secondary | ICD-10-CM | POA: Diagnosis not present

## 2016-06-07 DIAGNOSIS — Z87891 Personal history of nicotine dependence: Secondary | ICD-10-CM | POA: Insufficient documentation

## 2016-06-07 DIAGNOSIS — K1231 Oral mucositis (ulcerative) due to antineoplastic therapy: Secondary | ICD-10-CM

## 2016-06-07 DIAGNOSIS — E785 Hyperlipidemia, unspecified: Secondary | ICD-10-CM | POA: Insufficient documentation

## 2016-06-07 MED ORDER — NYSTATIN 100000 UNIT/ML MT SUSP: 5.0000 mL | Freq: Four times a day (QID) | OROMUCOSAL | 0 refills | Status: DC

## 2016-06-07 MED ORDER — MORPHINE SULFATE (CONCENTRATE) 10 MG /0.5 ML PO SOLN: 10.0000 mg | ORAL | 0 refills | Status: DC | PRN

## 2016-06-07 MED ORDER — FENTANYL 25 MCG/HR TD PT72: 25.0000 ug | MEDICATED_PATCH | TRANSDERMAL | 0 refills | Status: DC

## 2016-06-07 MED FILL — MORPHINE SULF 100 MG/5 ML S: 100 | 20 days supply | Qty: 120 | Fill #0

## 2016-06-07 MED FILL — fentaNYL 25 MCG/HR PT72: 25 | 15 days supply | Qty: 5 | Fill #0

## 2016-06-07 MED FILL — NYSTATIN 100,000 UNITS/ML S: 100000 | 10 days supply | Qty: 200 | Fill #0

## 2016-06-07 NOTE — Assessment & Plan Note (Signed)
I have a long discussion with the patient and his wife regarding chronic pain management. I will prescribe liquid morphine sulfate to start with and fentanyl patch as needed I will reassess pain control next week

## 2016-06-07 NOTE — Assessment & Plan Note (Signed)
He had recent weight loss  We will monitor his weight carefully I recommend increase oral intake as tolerated

## 2016-06-07 NOTE — Assessment & Plan Note (Signed)
He has significant pressure sore at the site of the feeding tube insertion I suspect this is due to abdominal distention from recent constipation I reinforced importance of taking Kayexalate as prescribed to relieve the constipation I have loosened the area of the feeding tube a little bit  I cleaned the area with some saline water and reapply clean gauze I will get my ENT navigator to monitor the site closely tomorrow

## 2016-06-07 NOTE — Assessment & Plan Note (Signed)
He had severe constipation recently As above, I have prescribed Kayexalate I reinforced importance of taking daily MiraLAX.

## 2016-06-07 NOTE — Assessment & Plan Note (Signed)
He has oral thrush I will prescribe nystatin swish and swallow

## 2016-06-07 NOTE — Assessment & Plan Note (Signed)
He had recent hyperkalemia for some unknown reason He had significant recent constipation We will monitor carefully We will proceed with treatment tomorrow without dose adjustment  I have discussed with the pharmacist to omit potassium chloride in the pre- and post-hydration fluids tomorrow I will also prescribe Kayexalate and recommend the patient to take that immediately due to danger of hyperkalemia

## 2016-06-07 NOTE — Assessment & Plan Note (Signed)
He tolerated chemotherapy well with expected side effects We will proceed with treatment without dose adjustment I will see him on a weekly basis for supportive care

## 2016-06-07 NOTE — Progress Notes (Signed)
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 Tumor measures 0.9 cm in greatest dimension. Tumor extends to the inked deep tissue edge. Depth of invasion: 4 mm (in this material) See note.  Note: an immunostains for P16 was performed at an outside hospital and provided for [place] to review; p16 immunostain is negative. Per outside report, In situ hybridization for high-risk HPV types showed no evidence of transcriptionally active high-risk HPV types.      03/21/2016 Initial Diagnosis    Salient findings:  -EAC's clear -Lesion within left buccal mucosa (pictured below) with about 60m depth. Width is about 2/3 of the left buccal mucosal surface area by palpation. Extends to the oral commissure.  -Tongue soft and mobile without mucosal lesions -Neck soft with no palpable neck masses -Right handed -Right hand is pink and warm with Allen's test negative (normal)  -Left forearm very sun damaged with multiple treated areas from precancerous or cancerous areas. Right forearm not as dramatic, presumably from sun exposure while driving      [id number]    RESECTION OF ORAL BUCCAL MUCOSA SELECTIVE NECK DISSECTION FREE FLAP RADIAL FOREARM PR SPLIT GInkster<100 SQCM [15100] (SPLIT THICKNESS SKIN GRAFT LEG         04/13/2016 Pathology Results    A.LEFT ORAL COMMISSURE INFERIOR, EXCISION: No malignancy identified.  B.LEFT ORAL COMMISSURE SUPERIOR, EXCISION: No malignancy identified.  C.LEFT DEEP ORAL COMMISSURE, EXCISION: No malignancy identified.  D.RIGHT POSTERIOR BUCCAL INFERIOR, EXCISION: No malignancy identified.  E.RIGHT POSTERIOR BUCCAL SUPERIOR, EXCISION: No malignancy identified.  F.ORAL CAVITY, LEFT BUCCAL, EXCISION:  Invasive squamous cell carcinoma, moderately differentiated,  keratinizing. Tumor size:2.2 cm. Tumor depth of invasion:0.8 cm. Perineural invasion identified. Margins negative for malignancy. Pathologic stage:pT2 pN2a. See tumor protocol summary below.  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 Tumor deposit size:1.1 cm. Extranodal extension present. Benign salivary gland tissue.  SURGICAL PATHOLOGY CANCER CASE SUMMARY (AJCC 8TH EDITION) LIP AND ORAL CAVITY CAP Protocol posting date: June, 2017 Version: LipOralCavity 4.0.0.0  PROCEDURE: Wide resection of buccal mucosa TUMOR SITE:Oral:Buccal mucosa TUMOR LATERALITY: Left TUMOR FOCALITY: Unifocal TUMOR SIZE:Greatest dimension: 2.2 cm TUMOR DEPTH OF INVASION (DOI):8 mm HISTOLOGIC TYPE:Squamous cell carcinoma, conventional HISTOLOGIC GRADE:G2: Moderately differentiated SPECIMEN MARGINS:Uninvolved by invasive carcinoma  DISTANCE FROM CLOSEST MARGIN:1 mm  SPECIFY MARGIN(S):Deep LYMPHOVASCULAR INVASION:Present PERINEURAL INVASION: Present REGIONAL LYMPH NODES: NUMBER OF LYMPH NODES INVOLVED: 1 NUMBER OF LYMPH NODES EXAMINED: 4 LATERALITY OF LYMPH NODES INVOLVED: Ipsilateral SIZE OF LARGEST METASTATIC DEPOSIT:1.1 cm EXTRANODAL EXTENSION:Present PATHOLOGIC STAGE CLASSIFICATION (pTNM, AJCC 8th  Ed): pT2 pN2a pT2: umor >2 cm but <=4 cm and <=10 mm DOI pN2a:Metastasis in a single ipsilateral lymph node, 3 cm or  smaller in greatest dimension and ENE(+) BIOMARKERS:performed on prior outside biopsy ([facility] case [id number] outside case [id number] P16 (Immunohistochemistry):Negative HPV (high-risk types by in situ hybridization):Reported as negative      05/11/2016 Imaging    1.  4 mm left lower lobe pulmonary nodule. Attention on follow-up imaging recommended as metastatic disease not excluded. 2. 2.8 cm left thyroid nodule. Thyroid ultrasound recommended to further evaluate. 3. Bilateral renal cysts. 4. Coronary artery and thoracoabdominal aortic atherosclerosis. 5. Ankylosing spondylitis.        05/11/2016 Imaging    CT neck: Surgical clips in the region of the left buccal mucosa, presumably at the previous primary site. Mild scarring in that region measuring about 8 mm. Cannot assess for residual or recurrent disease in that location, but this may simply be scarring. Previous left submandibular resection and left neck node dissection. No abnormal nodes presently. Multinodular goiter. Ankylosing spondylitis.  C1-2 remains a mobile segment.      05/21/2016 Procedure    1. Successful placement of a right internal jugular approach power injectable Port-A-Cath. The Port a catheter is ready for immediate use. 2. Successful fluoroscopic insertion of a 20-French pull-through gastrostomy tube. The gastrostomy may be used immediately for medication administration and may be utilized in 24 hrs for the initiation of feeds.      05/23/2016 -  Radiation Therapy    He received radiation treatment       05/25/2016 -  Chemotherapy    He received weekly reduced dose cisplatin        INTERVAL HISTORY: Please see below for problem oriented charting. He is seen today as part of his weekly supportive care visit and before treatment tomorrow He is noted to have  oral thrush. He complained of worsening mucositis He has lost minimum nausea but has lost some weight. He denies dysphagia He  complained of pressure sore around the feeding tube area with some mild serosanguineous fluid around it He has not really started using feeding tube He has recent constipation  REVIEW OF SYSTEMS:   Constitutional: Denies fevers, chills or abnormal weight loss Eyes: Denies blurriness of vision Respiratory: Denies cough, dyspnea or wheezes Cardiovascular: Denies palpitation, chest discomfort or lower extremity swelling Skin: Denies abnormal skin rashes Lymphatics: Denies new lymphadenopathy or easy bruising Neurological:Denies numbness, tingling or new weaknesses Behavioral/Psych: Mood is stable, no new changes  All other systems were reviewed with the patient and are negative.  I have reviewed the past medical history, past surgical history, social history and family history with the patient and they are unchanged from previous note.  ALLERGIES:  has No Known Allergies.  MEDICATIONS:  Current Outpatient Prescriptions  Medication Sig Dispense Refill  . aspirin EC 81 MG tablet Take 81 mg by mouth daily.    Marland Kitchen atorvastatin (LIPITOR) 80 MG tablet Take 80 mg by mouth at bedtime.    . cyanocobalamin (,VITAMIN B-12,) 1000 MCG/ML injection INJECT 1ML ONCE MONTHLY. 3 mL 1  . isosorbide mononitrate (IMDUR) 60 MG 24 hr tablet Take 90 mg by mouth daily.    Marland Kitchen lidocaine (XYLOCAINE) 2 % solution Patient: Mix 1part 2% viscous lidocaine, 1part H20. Swish & swallow 82m of diluted mixture, 327m before meals and at bedtime, up to QID 100 mL 5  . lidocaine-prilocaine (EMLA) cream Apply to affected area once 30 g 3  . mercaptopurine (PURINETHOL) 50 MG tablet TAKE ONE TABLET ONCE A DAY ON AN EMPTY STOMACH 1 HR BEFORE OR 2HRS AFTER A MEAL 30 tablet 2  . mesalamine (LIALDA) 1.2 g EC tablet Take 2.4 g by mouth daily with breakfast.    . metFORMIN (GLUCOPHAGE) 500 MG tablet Take 1  tablet (500 mg total) by mouth 2 (two) times daily with  a meal. Must Keep 05/16/15 appt for future refills 180 tablet 0  . ondansetron (ZOFRAN) 8 MG tablet Take 1 tablet (8 mg total) by mouth 2 (two) times daily as needed. Start on the third day after chemotherapy. 30 tablet 1  . oxyCODONE (OXY IR/ROXICODONE) 5 MG immediate release tablet Take 5 mg by mouth every 6 (six) hours as needed for severe pain.    Marland Kitchen prochlorperazine (COMPAZINE) 10 MG tablet Take 1 tablet (10 mg total) by mouth every 6 (six) hours as needed (Nausea or vomiting). 30 tablet 1  . SPS 15 GM/60ML suspension Take 30 GM X 1 120 mL 0  . fentaNYL (DURAGESIC - DOSED MCG/HR) 25 MCG/HR patch Place 1 patch (25 mcg total) onto the skin every 3 (three) days. 5 patch 0  . Morphine Sulfate (MORPHINE CONCENTRATE) 10 mg / 0.5 ml concentrated solution Place 0.5 mLs (10 mg total) into feeding tube every 2 (two) hours as needed for severe pain. 120 mL 0  . nitroGLYCERIN (NITROSTAT) 0.4 MG SL tablet Place 0.4 mg under the tongue every 5 (five) minutes as needed for chest pain.    Marland Kitchen nystatin (MYCOSTATIN) 100000 UNIT/ML suspension Take 5 mLs (500,000 Units total) by mouth 4 (four) times daily. 200 mL 0   No current facility-administered medications for this visit.     PHYSICAL EXAMINATION: ECOG PERFORMANCE STATUS: 1 - Symptomatic but completely ambulatory  Vitals:   06/07/16 1408  BP: (!) 125/104  Pulse: 74  Resp: 18  Temp: 98.5 F (36.9 C)   Filed Weights   06/07/16 1408  Weight: 183 lb 4.8 oz (83.1 kg)    GENERAL:alert, no distress and comfortable SKIN: skin color, texture, turgor are normal, no rashes or significant lesions EYES: normal, Conjunctiva are pink and non-injected, sclera clear OROPHARYNX: Noted mucositis and oral thrush  NECK: supple, thyroid normal size, non-tender, without nodularity LYMPH:  no palpable lymphadenopathy in the cervical, axillary or inguinal LUNGS: clear to auscultation and percussion with normal  breathing effort HEART: regular rate & rhythm and no murmurs and no lower extremity edema ABDOMEN:abdomen soft, non-tender and normal bowel sounds.  There is a pressure sore around the feeding tube area.  I have cleaned the area and apply clean gauze Musculoskeletal:no cyanosis of digits and no clubbing  NEURO: alert & oriented x 3 with fluent speech, no focal motor/sensory deficits  LABORATORY DATA:  I have reviewed the data as listed    Component Value Date/Time   NA 137 06/06/2016 1430   K 5.4 (H) 06/06/2016 1430   CL 102 05/22/2016 1020   CO2 29 06/06/2016 1430   GLUCOSE 180 (H) 06/06/2016 1430   GLUCOSE 98 12/12/2005 0729   BUN 21.0 06/06/2016 1430   CREATININE 1.0 06/06/2016 1430   CALCIUM 9.9 06/06/2016 1430   PROT 7.0 06/06/2016 1430   ALBUMIN 3.9 06/06/2016 1430   AST 11 06/06/2016 1430   ALT 12 06/06/2016 1430   ALKPHOS 60 06/06/2016 1430   BILITOT 0.65 06/06/2016 1430   GFRNONAA >60 05/22/2016 1020   GFRAA >60 05/22/2016 1020    No results found for: SPEP, UPEP  Lab Results  Component Value Date   WBC 8.0 06/06/2016   NEUTROABS 6.6 (H) 06/06/2016   HGB 11.6 (L) 06/06/2016   HCT 34.3 (L) 06/06/2016   MCV 107.7 (H) 06/06/2016   PLT 158 06/06/2016      Chemistry      Component Value Date/Time   NA 137 06/06/2016 1430  K 5.4 (H) 06/06/2016 1430   CL 102 05/22/2016 1020   CO2 29 06/06/2016 1430   BUN 21.0 06/06/2016 1430   CREATININE 1.0 06/06/2016 1430      Component Value Date/Time   CALCIUM 9.9 06/06/2016 1430   ALKPHOS 60 06/06/2016 1430   AST 11 06/06/2016 1430   ALT 12 06/06/2016 1430   BILITOT 0.65 06/06/2016 1430       RADIOGRAPHIC STUDIES: I have personally reviewed the radiological images as listed and agreed with the findings in the report. Dg Chest 2 View  Result Date: 05/22/2016 CLINICAL DATA:  Syncope. EXAM: CHEST  2 VIEW COMPARISON:  Radiograph of May 21, 2016. FINDINGS: Stable cardiomediastinal silhouette. Status post coronary  artery bypass graft. No pneumothorax or pleural effusion is noted. No acute pulmonary disease is noted. Right internal jugular Port-A-Cath is noted with distal tip in expected position of cavoatrial junction. Bony thorax is unremarkable. IMPRESSION: No active cardiopulmonary disease. Electronically Signed   By: Marijo Conception, M.D.   On: 05/22/2016 11:59   Ct Soft Tissue Neck W Contrast  Result Date: 05/11/2016 CLINICAL DATA:  Carcinoma of the left buccal mucosa diagnosed in December of 2017 EXAM: CT NECK WITH CONTRAST TECHNIQUE: Multidetector CT imaging of the neck was performed using the standard protocol following the bolus administration of intravenous contrast. CONTRAST:  172m ISOVUE-300 IOPAMIDOL (ISOVUE-300) INJECTION 61% COMPARISON:  None. FINDINGS: Pharynx and larynx: Surgical clips in the region of the left buccal mucosa. Mild scarring in that region measuring about 8 mm. No definite actual mass lesion. No other focal mucosal finding. Larynx appears normal. Salivary glands: Left parotid slightly atrophic. Right parotid normal. Right submandibular normal. Left submandibular previously resected. Thyroid: Multinodular goiter with largest nodule inferior on the left measuring 3.1 cm. Lymph nodes: No enlarged or low-density nodes on either side of the neck. Calcified 4 mm supraclavicular node on the left. Surgical clips related to previous level 2 and level 3 node resections. Vascular: Bilateral carotid calcification. Limited intracranial: Normal except for vascular calcification. Visualized orbits: Normal Mastoids and visualized paranasal sinuses: Clear/normal Skeleton: Ankylosis of the spine consistent with ankylosing spondylitis. Ankylosis of the occiput put and C1. 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 Upper chest: See results of chest CT. Other: None IMPRESSION: Surgical clips in the region of the left buccal mucosa, presumably at the previous primary site. Mild scarring  in that region measuring about 8 mm. Cannot assess for residual or recurrent disease in that location, but this may simply be scarring. Previous left submandibular resection and left neck node dissection. No abnormal nodes presently. Multinodular goiter. Ankylosing spondylitis.  C1-2 remains a mobile segment. Electronically Signed   By: MNelson ChimesM.D.   On: 05/11/2016 09:17   Ct Chest W Contrast  Result Date: 05/11/2016 CLINICAL DATA:  Head and neck cancer. EXAM: CT CHEST, ABDOMEN, WITH CONTRAST TECHNIQUE: Multidetector CT imaging of the chest, abdomen and pelvis was performed following the standard protocol during bolus administration of intravenous contrast. CONTRAST:  1030mISOVUE-300 IOPAMIDOL (ISOVUE-300) INJECTION 61% COMPARISON:  None. FINDINGS: CT CHEST FINDINGS Cardiovascular: The heart size is normal. No pericardial effusion. Patient is status post CABG. Atherosclerotic calcification is noted in the wall of the thoracic aorta. Mediastinum/Nodes: No mediastinal lymphadenopathy. There is no hilar lymphadenopathy. The esophagus has normal imaging features. 2.8 cm left thyroid nodule associated with small right thyroid nodules. There is no axillary lymphadenopathy. Lungs/Pleura: 4 mm left lower lobe pulmonary nodule seen image 98 series  7. No focal airspace consolidation. No pulmonary edema or pleural effusion. Musculoskeletal: Bone windows reveal no worrisome lytic or sclerotic osseous lesions. CT ABDOMEN FINDINGS Hepatobiliary: No focal abnormality within the liver parenchyma. 14 mm calcified gallstone. No intrahepatic or extrahepatic biliary dilation. Pancreas: No focal mass lesion. No dilatation of the main duct. No intraparenchymal cyst. No peripancreatic edema. Spleen: No splenomegaly. No focal mass lesion. Adrenals/Urinary Tract: No adrenal nodule or mass. 5.6 cm exophytic cyst right kidney. 4.2 cm left renal cyst associated with another smaller 2.7 cm cyst lower pole left kidney. Small central  sinus cysts noted in the kidneys bilaterally Stomach/Bowel: Stomach is nondistended. No gastric wall thickening. No evidence of outlet obstruction. Duodenum is normally positioned as is the ligament of Treitz. Visualize small bowel loops are unremarkable. Abdominal segments of the colon are unremarkable. Vascular/Lymphatic: There is abdominal aortic atherosclerosis without aneurysm. There is no gastrohepatic or hepatoduodenal ligament lymphadenopathy. No intraperitoneal or retroperitoneal lymphadenopathy. Other: No intraperitoneal free fluid. Musculoskeletal: Bone windows reveal no worrisome lytic or sclerotic osseous lesions. Changes of ankylosing spondylitis noted. IMPRESSION: 1. 4 mm left lower lobe pulmonary nodule. Attention on follow-up imaging recommended as metastatic disease not excluded. 2. 2.8 cm left thyroid nodule. Thyroid ultrasound recommended to further evaluate. 3. Bilateral renal cysts. 4. Coronary artery and thoracoabdominal aortic atherosclerosis. 5. Ankylosing spondylitis. Electronically Signed   By: Misty Stanley M.D.   On: 05/11/2016 09:32   Ct Abdomen W Contrast  Result Date: 05/11/2016 CLINICAL DATA:  Head and neck cancer. EXAM: CT CHEST, ABDOMEN, WITH CONTRAST TECHNIQUE: Multidetector CT imaging of the chest, abdomen and pelvis was performed following the standard protocol during bolus administration of intravenous contrast. CONTRAST:  127m ISOVUE-300 IOPAMIDOL (ISOVUE-300) INJECTION 61% COMPARISON:  None. FINDINGS: CT CHEST FINDINGS Cardiovascular: The heart size is normal. No pericardial effusion. Patient is status post CABG. Atherosclerotic calcification is noted in the wall of the thoracic aorta. Mediastinum/Nodes: No mediastinal lymphadenopathy. There is no hilar lymphadenopathy. The esophagus has normal imaging features. 2.8 cm left thyroid nodule associated with small right thyroid nodules. There is no axillary lymphadenopathy. Lungs/Pleura: 4 mm left lower lobe pulmonary nodule  seen image 98 series 7. No focal airspace consolidation. No pulmonary edema or pleural effusion. Musculoskeletal: Bone windows reveal no worrisome lytic or sclerotic osseous lesions. CT ABDOMEN FINDINGS Hepatobiliary: No focal abnormality within the liver parenchyma. 14 mm calcified gallstone. No intrahepatic or extrahepatic biliary dilation. Pancreas: No focal mass lesion. No dilatation of the main duct. No intraparenchymal cyst. No peripancreatic edema. Spleen: No splenomegaly. No focal mass lesion. Adrenals/Urinary Tract: No adrenal nodule or mass. 5.6 cm exophytic cyst right kidney. 4.2 cm left renal cyst associated with another smaller 2.7 cm cyst lower pole left kidney. Small central sinus cysts noted in the kidneys bilaterally Stomach/Bowel: Stomach is nondistended. No gastric wall thickening. No evidence of outlet obstruction. Duodenum is normally positioned as is the ligament of Treitz. Visualize small bowel loops are unremarkable. Abdominal segments of the colon are unremarkable. Vascular/Lymphatic: There is abdominal aortic atherosclerosis without aneurysm. There is no gastrohepatic or hepatoduodenal ligament lymphadenopathy. No intraperitoneal or retroperitoneal lymphadenopathy. Other: No intraperitoneal free fluid. Musculoskeletal: Bone windows reveal no worrisome lytic or sclerotic osseous lesions. Changes of ankylosing spondylitis noted. IMPRESSION: 1. 4 mm left lower lobe pulmonary nodule. Attention on follow-up imaging recommended as metastatic disease not excluded. 2. 2.8 cm left thyroid nodule. Thyroid ultrasound recommended to further evaluate. 3. Bilateral renal cysts. 4. Coronary artery and thoracoabdominal aortic atherosclerosis. 5. Ankylosing spondylitis.  Electronically Signed   By: Misty Stanley M.D.   On: 05/11/2016 09:32   Ct Abdomen Pelvis W Contrast  Result Date: 05/21/2016 CLINICAL DATA:  Acute onset of shortness of breath and generalized abdominal pain. Recent G-tube placement.  Initial encounter. EXAM: CT ABDOMEN AND PELVIS WITH CONTRAST TECHNIQUE: Multidetector CT imaging of the abdomen and pelvis was performed using the standard protocol following bolus administration of intravenous contrast. CONTRAST:  177m ISOVUE-300 IOPAMIDOL (ISOVUE-300) INJECTION 61% COMPARISON:  CT of the abdomen and pelvis performed 05/11/2016 FINDINGS: Lower chest: Diffuse coronary artery calcifications are seen. The patient is status post median sternotomy. Bibasilar atelectasis or scarring is noted. Hepatobiliary: The liver is unremarkable in appearance. A stone is noted dependently within the gallbladder. The gallbladder is otherwise unremarkable. The common bile duct remains normal in caliber. Pancreas: The pancreas is within normal limits. Spleen: The spleen is unremarkable in appearance. Adrenals/Urinary Tract: The adrenal glands are unremarkable in appearance. Bilateral renal cysts are noted. Nonspecific perinephric stranding is noted bilaterally. There is no evidence of hydronephrosis. No renal or ureteral stones are identified. Stomach/Bowel: The patient's G-tube is noted at the body of the stomach. A small amount of free air is seen tracking within the abdomen, reflecting recent surgery. The small bowel is within normal limits. The appendix is not visualized; there is no evidence for appendicitis. There is wall thickening along the distal sigmoid colon and rectum, raising question for mild proctitis. The colon is otherwise unremarkable in appearance. Vascular/Lymphatic: Scattered calcification is seen along the abdominal aorta and its branches. The abdominal aorta is otherwise grossly unremarkable. The inferior vena cava is grossly unremarkable. No retroperitoneal lymphadenopathy is seen. No pelvic sidewall lymphadenopathy is identified. Reproductive: The bladder is mildly distended. Mild bladder wall thickening could reflect cystitis. The prostate is enlarged, measuring 5.6 cm in transverse dimension,  with mild heterogeneity. Other: A small left inguinal hernia is noted, containing only fat. Musculoskeletal: No acute osseous abnormalities are identified. Anterior bridging osteophytes are noted along the lower thoracic and upper lumbar spine. Facet disease is noted along the lumbar spine. The visualized musculature is unremarkable in appearance. IMPRESSION: 1. Wall thickening along the distal sigmoid colon rectum raises question for mild proctitis. 2. Mild bladder wall thickening could reflect cystitis. 3. Postoperative free air within the abdomen, given recent surgery. G-tube noted ending at the body of the stomach. 4. Diffuse coronary artery calcifications seen. 5. Cholelithiasis.  Gallbladder otherwise unremarkable. 6. Bilateral renal cysts noted. 7. Scattered aortic atherosclerosis. 8. Enlarged prostate, with mild heterogeneity. Would correlate with PSA. 9. Small left inguinal hernia, containing only fat. 10. Anterior bridging osteophytes along the lower thoracic and upper lumbar spine. Electronically Signed   By: JGarald BaldingM.D.   On: 05/21/2016 19:21   Ir Gastrostomy Tube  Result Date: 05/21/2016 INDICATION: History of head neck cancer, in need of durable intravenous access for chemotherapy administration. In need of enteric access for enteric nutrition supplementation prior to initiation of chemoradiation. EXAM: 1. IMPLANTED PORT A CATH PLACEMENT WITH ULTRASOUND AND FLUOROSCOPIC GUIDANCE 2. FLUOROSCOPIC GUIDED PERCUTANEOUS GASTROSTOMY TUBE PLACEMENT COMPARISON:  CT the chest, abdomen pelvis - 05/12/2018 MEDICATIONS: Glucagon 1 mg IV; Ancef 2 g IV; The antibiotic was administered within an appropriate time interval prior to skin puncture. ANESTHESIA/SEDATION: Moderate (conscious) sedation was employed during this procedure. A total of Versed 6 mg and Fentanyl 100 mcg was administered intravenously. Moderate Sedation Time: 45 minutes. The patient's level of consciousness and vital signs were monitored  continuously by  radiology nursing throughout the procedure under my direct supervision. CONTRAST:  20 - cc Isovue 300, administered into the stomach lumen. FLUOROSCOPY TIME:  A total of 1 minute, 54 seconds (38.2 mGy) fluoroscopy time was utilized for both procedures. COMPLICATIONS: None immediate. PROCEDURE: The procedures, risks, benefits, and alternatives were explained to the patient. Questions regarding the procedure were encouraged and answered. The patient understands and consents to both procedures. Attention was initially paid towards placement of the [facility] a Catheter. The right neck and chest were prepped with chlorhexidine in a sterile fashion, and a sterile drape was applied covering the operative field. Maximum barrier sterile technique with sterile gowns and gloves were used for the procedure. A timeout was performed prior to the initiation of the procedure. Local anesthesia was provided with 1% lidocaine with epinephrine. After creating a small venotomy incision, a micropuncture kit was utilized to access the internal jugular vein under direct, real-time ultrasound guidance. Ultrasound image documentation was performed. The microwire was kinked to measure appropriate catheter length. A subcutaneous port pocket was then created along the upper chest wall utilizing a combination of Orvis and blunt dissection. The pocket was irrigated with sterile saline. A single lumen thin power injectable port was chosen for placement. The 8 Fr catheter was tunneled from the port pocket site to the venotomy incision. The port was placed in the pocket. The external catheter was trimmed to appropriate length. At the venotomy, an 8 Fr peel-away sheath was placed over a guidewire under fluoroscopic guidance. The catheter was then placed through the sheath and the sheath was removed. Final catheter positioning was confirmed and documented with a fluoroscopic spot radiograph. The port was accessed with a Huber needle,  aspirated and flushed with heparinized saline. The venotomy site was closed with an interrupted 4-0 Vicryl suture. The port pocket incision was closed with interrupted 2-0 Vicryl suture and the skin was opposed with a running subcuticular 4-0 Vicryl suture. Dermabond and Steri-strips were applied to both incisions. Dressings were placed. ____________________________________________ Next, attention was paid towards placement of the gastrostomy tube. The left upper quadrant was sterilely prepped and draped. An oral gastric catheter was inserted into the stomach under fluoroscopy. The existing nasogastric feeding tube was removed. The left costal margin and air / barium opacified transverse colon were identified and avoided. Air was injected into the stomach for insufflation and visualization under fluoroscopy. Under sterile conditions a 17 gauge trocar needle was utilized to access the stomach percutaneously beneath the left subcostal margin after the overlying soft tissues were anesthetized with 1% Lidocaine with epinephrine. Needle position was confirmed within the stomach with aspiration of air and injection of small amount of contrast. A single T tack was deployed for gastropexy. Over an Amplatz guide wire, a 9-French sheath was inserted into the stomach. A snare device was utilized to capture the oral gastric catheter. The snare device was pulled retrograde from the stomach up the esophagus and out the oropharynx. The 20-French pull-through gastrostomy was connected to the snare device and pulled antegrade through the oropharynx down the esophagus into the stomach and then through the percutaneous tract external to the patient. The gastrostomy was assembled externally. Contrast injection confirms position in the stomach. Several spot radiographic images were obtained in various obliquities for documentation. Dressings were placed. The patient tolerated both above procedures well without immediate post procedural  complication. FINDINGS: After catheter placement, the tip lies within the superior cavoatrial junction. The catheter aspirates and flushes normally and is ready for  immediate use. After successful fluoroscopic guided placement, the gastrostomy tube is appropriately positioned with internal disc against the ventral aspect of the gastric lumen. IMPRESSION: 1. Successful placement of a right internal jugular approach power injectable Port-A-Cath. The Port a catheter is ready for immediate use. 2. Successful fluoroscopic insertion of a 20-French pull-through gastrostomy tube. The gastrostomy may be used immediately for medication administration and may be utilized in 24 hrs for the initiation of feeds. Electronically Signed   By: Sandi Mariscal M.D.   On: 05/21/2016 12:48   Ir US Guide Vasc Access Right  Result Date: 05/21/2016 INDICATION: History of head neck cancer, in need of durable intravenous access for chemotherapy administration. In need of enteric access for enteric nutrition supplementation prior to initiation of chemoradiation. EXAM: 1. IMPLANTED PORT A CATH PLACEMENT WITH ULTRASOUND AND FLUOROSCOPIC GUIDANCE 2. FLUOROSCOPIC GUIDED PERCUTANEOUS GASTROSTOMY TUBE PLACEMENT COMPARISON:  CT the chest, abdomen pelvis - 05/12/2018 MEDICATIONS: Glucagon 1 mg IV; Ancef 2 g IV; The antibiotic was administered within an appropriate time interval prior to skin puncture. ANESTHESIA/SEDATION: Moderate (conscious) sedation was employed during this procedure. A total of Versed 6 mg and Fentanyl 100 mcg was administered intravenously. Moderate Sedation Time: 45 minutes. The patient's level of consciousness and vital signs were monitored continuously by radiology nursing throughout the procedure under my direct supervision. CONTRAST:  20 - cc Isovue 300, administered into the stomach lumen. FLUOROSCOPY TIME:  A total of 1 minute, 54 seconds (38.2 mGy) fluoroscopy time was utilized for both procedures. COMPLICATIONS: None  immediate. PROCEDURE: The procedures, risks, benefits, and alternatives were explained to the patient. Questions regarding the procedure were encouraged and answered. The patient understands and consents to both procedures. Attention was initially paid towards placement of the [facility] a Catheter. The right neck and chest were prepped with chlorhexidine in a sterile fashion, and a sterile drape was applied covering the operative field. Maximum barrier sterile technique with sterile gowns and gloves were used for the procedure. A timeout was performed prior to the initiation of the procedure. Local anesthesia was provided with 1% lidocaine with epinephrine. After creating a small venotomy incision, a micropuncture kit was utilized to access the internal jugular vein under direct, real-time ultrasound guidance. Ultrasound image documentation was performed. The microwire was kinked to measure appropriate catheter length. A subcutaneous port pocket was then created along the upper chest wall utilizing a combination of Espaillat and blunt dissection. The pocket was irrigated with sterile saline. A single lumen thin power injectable port was chosen for placement. The 8 Fr catheter was tunneled from the port pocket site to the venotomy incision. The port was placed in the pocket. The external catheter was trimmed to appropriate length. At the venotomy, an 8 Fr peel-away sheath was placed over a guidewire under fluoroscopic guidance. The catheter was then placed through the sheath and the sheath was removed. Final catheter positioning was confirmed and documented with a fluoroscopic spot radiograph. The port was accessed with a Huber needle, aspirated and flushed with heparinized saline. The venotomy site was closed with an interrupted 4-0 Vicryl suture. The port pocket incision was closed with interrupted 2-0 Vicryl suture and the skin was opposed with a running subcuticular 4-0 Vicryl suture. Dermabond and Steri-strips were  applied to both incisions. Dressings were placed. ____________________________________________ Next, attention was paid towards placement of the gastrostomy tube. The left upper quadrant was sterilely prepped and draped. An oral gastric catheter was inserted into the stomach under fluoroscopy. The existing nasogastric  feeding tube was removed. The left costal margin and air / barium opacified transverse colon were identified and avoided. Air was injected into the stomach for insufflation and visualization under fluoroscopy. Under sterile conditions a 17 gauge trocar needle was utilized to access the stomach percutaneously beneath the left subcostal margin after the overlying soft tissues were anesthetized with 1% Lidocaine with epinephrine. Needle position was confirmed within the stomach with aspiration of air and injection of small amount of contrast. A single T tack was deployed for gastropexy. Over an Amplatz guide wire, a 9-French sheath was inserted into the stomach. A snare device was utilized to capture the oral gastric catheter. The snare device was pulled retrograde from the stomach up the esophagus and out the oropharynx. The 20-French pull-through gastrostomy was connected to the snare device and pulled antegrade through the oropharynx down the esophagus into the stomach and then through the percutaneous tract external to the patient. The gastrostomy was assembled externally. Contrast injection confirms position in the stomach. Several spot radiographic images were obtained in various obliquities for documentation. Dressings were placed. The patient tolerated both above procedures well without immediate post procedural complication. FINDINGS: After catheter placement, the tip lies within the superior cavoatrial junction. The catheter aspirates and flushes normally and is ready for immediate use. After successful fluoroscopic guided placement, the gastrostomy tube is appropriately positioned with internal  disc against the ventral aspect of the gastric lumen. IMPRESSION: 1. Successful placement of a right internal jugular approach power injectable Port-A-Cath. The Port a catheter is ready for immediate use. 2. Successful fluoroscopic insertion of a 20-French pull-through gastrostomy tube. The gastrostomy may be used immediately for medication administration and may be utilized in 24 hrs for the initiation of feeds. Electronically Signed   By: Sandi Mariscal M.D.   On: 05/21/2016 12:48   Dg Chest Port 1 View  Result Date: 05/21/2016 CLINICAL DATA:  Shortness of Breath EXAM: PORTABLE CHEST 1 VIEW COMPARISON:  05/11/2016 FINDINGS: Cardiac shadow is within normal limits. Postoperative changes are seen. Right chest wall port is noted. No focal infiltrate or sizable effusion is seen. No acute bony abnormality is noted. IMPRESSION: No acute abnormality seen. Electronically Signed   By: [name] M.D.   On: 05/21/2016 17:17   Dg Abd Portable 1 View  Result Date: 05/21/2016 CLINICAL DATA:  Abdominal distension and recent G-tube placement EXAM: PORTABLE ABDOMEN - 1 VIEW COMPARISON:  None. FINDINGS: Scattered large and small bowel gas is noted. The small bowel loops are mildly prominent likely representing an ileus. Gastrostomy catheter is noted just to the left of the midline. No other focal abnormality is seen. IMPRESSION: Changes most consistent with a small-bowel ileus. Although not mentioned in the body of the report there may be a small amount of free intraperitoneal air likely related to the recent gastrostomy placement. Electronically Signed   By: [name] M.D.   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 In need of enteric access for enteric nutrition supplementation prior to initiation of chemoradiation. EXAM: 1. IMPLANTED PORT A CATH PLACEMENT WITH  ULTRASOUND AND FLUOROSCOPIC GUIDANCE 2. FLUOROSCOPIC GUIDED PERCUTANEOUS GASTROSTOMY TUBE PLACEMENT COMPARISON:  CT the chest, abdomen pelvis - 05/12/2018 MEDICATIONS: Glucagon 1 mg IV; Ancef 2 g IV; The antibiotic was administered within an appropriate time interval prior to skin puncture. ANESTHESIA/SEDATION: Moderate (conscious) sedation was employed  during this procedure. A total of Versed 6 mg and Fentanyl 100 mcg was administered intravenously. Moderate Sedation Time: 45 minutes. The patient's level of consciousness and vital signs were monitored continuously by radiology nursing throughout the procedure under my direct supervision. CONTRAST:  20 - cc Isovue 300, administered into the stomach lumen. FLUOROSCOPY TIME:  A total of 1 minute, 54 seconds (38.2 mGy) fluoroscopy time was utilized for both procedures. COMPLICATIONS: None immediate. PROCEDURE: The procedures, risks, benefits, and alternatives were explained to the patient. Questions regarding the procedure were encouraged and answered. The patient understands and consents to both procedures. Attention was initially paid towards placement of the [facility] a Catheter. The right neck and chest were prepped with chlorhexidine in a sterile fashion, and a sterile drape was applied covering the operative field. Maximum barrier sterile technique with sterile gowns and gloves were used for the procedure. A timeout was performed prior to the initiation of the procedure. Local anesthesia was provided with 1% lidocaine with epinephrine. After creating a small venotomy incision, a micropuncture kit was utilized to access the internal jugular vein under direct, real-time ultrasound guidance. Ultrasound image documentation was performed. The microwire was kinked to measure appropriate catheter length. A subcutaneous port pocket was then created along the upper chest wall utilizing a combination of [place]perna and blunt dissection. The pocket was irrigated with sterile saline. A  single lumen thin power injectable port was chosen for placement. The 8 Fr catheter was tunneled from the port pocket site to the venotomy incision. The port was placed in the pocket. The external catheter was trimmed to appropriate length. At the venotomy, an 8 Fr peel-away sheath was placed over a guidewire under fluoroscopic guidance. The catheter was then placed through the sheath and the sheath was removed. Final catheter positioning was confirmed and documented with a fluoroscopic spot radiograph. The port was accessed with a Huber needle, aspirated and flushed with heparinized saline. The venotomy site was closed with an interrupted 4-0 Vicryl suture. The port pocket incision was closed with interrupted 2-0 Vicryl suture and the skin was opposed with a running subcuticular 4-0 Vicryl suture. Dermabond and Steri-strips were applied to both incisions. Dressings were placed. ____________________________________________ Next, attention was paid towards placement of the gastrostomy tube. The left upper quadrant was sterilely prepped and draped. An oral gastric catheter was inserted into the stomach under fluoroscopy. The existing nasogastric feeding tube was removed. The left costal margin and air / barium opacified transverse colon were identified and avoided. Air was injected into the stomach for insufflation and visualization under fluoroscopy. Under sterile conditions a 17 gauge trocar needle was utilized to access the stomach percutaneously beneath the left subcostal margin after the overlying soft tissues were anesthetized with 1% Lidocaine with epinephrine. Needle position was confirmed within the stomach with aspiration of air and injection of small amount of contrast. A single T tack was deployed for gastropexy. Over an Amplatz guide wire, a 9-French sheath was inserted into the stomach. A snare device was utilized to capture the oral gastric catheter. The snare device was pulled retrograde from the  stomach up the esophagus and out the oropharynx. The 20-French pull-through gastrostomy was connected to the snare device and pulled antegrade through the oropharynx down the esophagus into the stomach and then through the percutaneous tract external to the patient. The gastrostomy was assembled externally. Contrast injection confirms position in the stomach. Several spot radiographic images were obtained in various obliquities for documentation. Dressings were placed. The  patient tolerated both above procedures well without immediate post procedural complication. FINDINGS: After catheter placement, the tip lies within the superior cavoatrial junction. The catheter aspirates and flushes normally and is ready for immediate use. After successful fluoroscopic guided placement, the gastrostomy tube is appropriately positioned with internal disc against the ventral aspect of the gastric lumen. IMPRESSION: 1. Successful placement of a right internal jugular approach power injectable Port-A-Cath. The Port a catheter is ready for immediate use. 2. Successful fluoroscopic insertion of a 20-French pull-through gastrostomy tube. The gastrostomy may be used immediately for medication administration and may be utilized in 24 hrs for the initiation of feeds. Electronically Signed   By: Sandi Mariscal M.D.   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 with expected side effects We will proceed with treatment without dose adjustment I will see him on a weekly basis for supportive care  Acute hyperkalemia He had recent hyperkalemia for some unknown reason He had significant recent constipation We will monitor carefully We will proceed with treatment tomorrow without dose adjustment  I have discussed with the pharmacist to omit potassium chloride in the pre- and post-hydration fluids tomorrow I will also prescribe Kayexalate and recommend the  patient to take that immediately due to danger of hyperkalemia  Weight loss He had recent weight loss  We will monitor his weight carefully I recommend increase oral intake as tolerated  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 I will prescribe liquid morphine sulfate to start with and fentanyl patch as needed I will reassess pain control next week  Pressure injury of skin, stage 1 He has significant pressure sore at the site of the feeding tube insertion I suspect this is due to abdominal distention from recent constipation I reinforced importance of taking Kayexalate as prescribed to relieve the constipation I have loosened the area of the feeding tube a little bit  I cleaned the area with some saline water and reapply clean gauze I will get my ENT navigator to monitor the site closely tomorrow  Other constipation He had severe constipation recently As above, I have prescribed Kayexalate I reinforced importance of taking daily MiraLAX.  Oral thrush He has oral thrush I will prescribe nystatin swish and swallow   No orders of the defined types were placed in this encounter.  All questions were answered. The patient knows to call the clinic with any problems, questions or concerns. No barriers to learning was detected. I spent 30 minutes counseling the patient face to face. The total time spent in the appointment was 40 minutes and more than 50% was on counseling and review of test results     [name], MD 06/07/2016 4:22 PM

## 2016-06-08 ENCOUNTER — Ambulatory Visit
Admission: RE | Admit: 2016-06-08 | Discharge: 2016-06-08 | Disposition: A | Discharge status: Home / Self Care | Payer: PPO | Source: Ambulatory Visit | Attending: Radiation Oncology | Admitting: Radiation Oncology

## 2016-06-08 ENCOUNTER — Ambulatory Visit (HOSPITAL_BASED_OUTPATIENT_CLINIC_OR_DEPARTMENT_OTHER): Payer: PPO

## 2016-06-08 ENCOUNTER — Encounter: Payer: Self-pay | Admitting: *Deleted

## 2016-06-08 VITALS — BP 133/76 | HR 70 | Temp 98.1°F | Resp 16

## 2016-06-08 DIAGNOSIS — R011 Cardiac murmur, unspecified: Secondary | ICD-10-CM | POA: Diagnosis not present

## 2016-06-08 DIAGNOSIS — Z5111 Encounter for antineoplastic chemotherapy: Secondary | ICD-10-CM | POA: Diagnosis not present

## 2016-06-08 DIAGNOSIS — C060 Malignant neoplasm of cheek mucosa: Secondary | ICD-10-CM

## 2016-06-08 DIAGNOSIS — Z510 Encounter for antineoplastic radiation therapy: Secondary | ICD-10-CM | POA: Diagnosis not present

## 2016-06-08 MED ORDER — PALONOSETRON HCL INJECTION 0.25 MG/5ML
0.2500 mg | Freq: Once | INTRAVENOUS | Status: AC
  Administered 2016-06-08: 0.25 mg via INTRAVENOUS

## 2016-06-08 MED ORDER — SODIUM CHLORIDE 0.9 % IV SOLN
30.0000 mg/m2 | Freq: Once | INTRAVENOUS | Status: AC
  Administered 2016-06-08: 60 mg via INTRAVENOUS
  Filled 2016-06-08: qty 60

## 2016-06-08 MED ORDER — SODIUM CHLORIDE 0.9% FLUSH
10.0000 mL | INTRAVENOUS | Status: DC | PRN
  Administered 2016-06-08: 10 mL
  Filled 2016-06-08: qty 10

## 2016-06-08 MED ORDER — PALONOSETRON HCL INJECTION 0.25 MG/5ML
INTRAVENOUS | Status: AC
  Filled 2016-06-08: qty 5

## 2016-06-08 MED ORDER — SODIUM CHLORIDE 0.9 % IV SOLN
Freq: Once | INTRAVENOUS | Status: AC
  Administered 2016-06-08: 10:00:00 via INTRAVENOUS

## 2016-06-08 MED ORDER — HEPARIN SOD (PORK) LOCK FLUSH 100 UNIT/ML IV SOLN
500.0000 [IU] | Freq: Once | INTRAVENOUS | Status: AC | PRN
  Administered 2016-06-08: 500 [IU]
  Filled 2016-06-08: qty 5

## 2016-06-08 MED ORDER — MAGNESIUM SULFATE 2 GM/50ML IV SOLN
Freq: Once | INTRAVENOUS | Status: AC
  Administered 2016-06-08: 10:00:00 via INTRAVENOUS
  Filled 2016-06-08: qty 1000

## 2016-06-08 MED ORDER — FOSAPREPITANT DIMEGLUMINE INJECTION 150 MG
Freq: Once | INTRAVENOUS | Status: AC
  Administered 2016-06-08: 13:00:00 via INTRAVENOUS
  Filled 2016-06-08: qty 5

## 2016-06-08 NOTE — Patient Instructions (Signed)
[facility] Discharge Instructions for Patients Receiving Chemotherapy  Today you received the following chemotherapy agents: Cisplatin   To help prevent nausea and vomiting after your treatment, we encourage you to take your nausea medication as directed.    If you develop nausea and vomiting that is not controlled by your nausea medication, call the clinic.   BELOW ARE SYMPTOMS THAT SHOULD BE REPORTED IMMEDIATELY:  *FEVER GREATER THAN 100.5 F  *CHILLS WITH OR WITHOUT FEVER  NAUSEA AND VOMITING THAT IS NOT CONTROLLED WITH YOUR NAUSEA MEDICATION  *UNUSUAL SHORTNESS OF BREATH  *UNUSUAL BRUISING OR BLEEDING  TENDERNESS IN MOUTH AND THROAT WITH OR WITHOUT PRESENCE OF ULCERS  *URINARY PROBLEMS  *BOWEL PROBLEMS  UNUSUAL RASH Items with * indicate a potential emergency and should be followed up as soon as possible.  Feel free to call the clinic you have any questions or concerns. The clinic phone number is [phone].  Please show the CHEMO ALERT CARD at check-in to the Emergency Department and triage nurse.   

## 2016-06-08 NOTE — Progress Notes (Signed)
Oncology Nurse Navigator Documentation  Met with Andres Smith in Infusion where he was receiving chemo. Assessed PEG site.  He denied concerns/issues. Dressing stained with what appeared to be supplement. Removed dressing, cleaned site with saline.  Area under retention ring slightly erythematous.  He indicated loosened by Dr. Alvy Bimler to relieve tenderness. Applied single clean split gauze, tightened retention ring slightly. He understands to call me with needs/concerns.  Gayleen Orem, RN, BSN, [place] [facility] Nurse [place] [facility] [place] [phone]

## 2016-06-11 ENCOUNTER — Ambulatory Visit
Admission: RE | Admit: 2016-06-11 | Discharge: 2016-06-11 | Disposition: A | Discharge status: Home / Self Care | Payer: PPO | Source: Ambulatory Visit | Attending: Radiation Oncology | Admitting: Radiation Oncology

## 2016-06-11 ENCOUNTER — Encounter: Payer: Self-pay | Admitting: Radiation Oncology

## 2016-06-11 VITALS — BP 123/65 | HR 58 | Temp 98.4°F | Ht 69.0 in | Wt 182.4 lb

## 2016-06-11 DIAGNOSIS — Z510 Encounter for antineoplastic radiation therapy: Secondary | ICD-10-CM | POA: Diagnosis not present

## 2016-06-11 DIAGNOSIS — C060 Malignant neoplasm of cheek mucosa: Secondary | ICD-10-CM

## 2016-06-11 DIAGNOSIS — R011 Cardiac murmur, unspecified: Secondary | ICD-10-CM | POA: Diagnosis not present

## 2016-06-11 NOTE — Progress Notes (Signed)

## 2016-06-11 NOTE — Progress Notes (Signed)
Mr. Neis presents for his 12th fraction of radiation to his Left Buccal, Left cheek. He denies pain. He is using lidocaine rinses before he eats. He has an ulcer to the left side of his lip that bleeds at times. He is eating softer foods. He is drinking 2-3 Ensure supplements daily. He is using his feeding tube for free water flushes. He reports constipation, and has not had a bowel movement since Friday. He is drinking miralax daily. He is not taking any other laxatives. The skin to his neck and Left cheek is red. He is using biafine twice daily. He does report itching to this area. He has a known skin cancer to his Right neck that he is worried about.   BP 123/65   Pulse (!) 58   Temp 98.4 F (36.9 C)   Ht 5\' 9"  (1.753 m)   Wt 182 lb 6.4 oz (82.7 kg)   SpO2 99% Comment: room air  BMI 26.94 kg/m    Wt Readings from Last 3 Encounters:  06/11/16 182 lb 6.4 oz (82.7 kg)  06/07/16 183 lb 4.8 oz (83.1 kg)  06/04/16 184 lb 6.4 oz (83.6 kg)

## 2016-06-12 ENCOUNTER — Ambulatory Visit
Admission: RE | Admit: 2016-06-12 | Discharge: 2016-06-12 | Disposition: A | Discharge status: Home / Self Care | Payer: PPO | Source: Ambulatory Visit | Attending: Radiation Oncology | Admitting: Radiation Oncology

## 2016-06-12 DIAGNOSIS — R011 Cardiac murmur, unspecified: Secondary | ICD-10-CM | POA: Diagnosis not present

## 2016-06-12 DIAGNOSIS — C060 Malignant neoplasm of cheek mucosa: Secondary | ICD-10-CM | POA: Diagnosis not present

## 2016-06-12 DIAGNOSIS — Z510 Encounter for antineoplastic radiation therapy: Secondary | ICD-10-CM | POA: Diagnosis not present

## 2016-06-12 NOTE — Progress Notes (Addendum)
   Weekly Management Note:  Outpatient    ICD-10-CM   1. 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  CBCT/MVCT images/Port film x-rays were reviewed.  The chart was checked.   Constipation noted despite miralax.  Left lip/mouth irritation. Dermatologist scheduled to see him in 3 month due to scabbing of potential skin cancers over neck.  Physical Findings:  height is 5\' 9"  (1.753 m) and weight is 182 lb 6.4 oz (82.7 kg). His temperature is 98.4 F (36.9 C). His blood pressure is 123/65 and his pulse is 58 (abnormal). His oxygen saturation is 99%.   Wt Readings from Last 3 Encounters:  06/11/16 182 lb 6.4 oz (82.7 kg)  06/07/16 183 lb 4.8 oz (83.1 kg)  06/04/16 184 lb 6.4 oz (83.6 kg)   Patchy mucositis in the mouth. No oral thrush. Moist desquamation of lateral left lips.  Impression:  The patient is tolerating radiotherapy.  Plan:  Continue radiotherapy as planned. The patient will apply Radiaplex to the skin within the treatment field as directed. Rx lidocaine today for mucosa/lip.  Has pain medication.  Constipation handout given and discussed. ________________________________   [name], M.D.

## 2016-06-13 ENCOUNTER — Ambulatory Visit
Admission: RE | Admit: 2016-06-13 | Discharge: 2016-06-13 | Disposition: A | Discharge status: Home / Self Care | Payer: PPO | Source: Ambulatory Visit | Attending: Radiation Oncology | Admitting: Radiation Oncology

## 2016-06-13 ENCOUNTER — Other Ambulatory Visit (HOSPITAL_BASED_OUTPATIENT_CLINIC_OR_DEPARTMENT_OTHER): Payer: PPO

## 2016-06-13 ENCOUNTER — Ambulatory Visit: Payer: PPO | Admitting: Physical Therapy

## 2016-06-13 ENCOUNTER — Ambulatory Visit (HOSPITAL_BASED_OUTPATIENT_CLINIC_OR_DEPARTMENT_OTHER): Payer: PPO | Admitting: Hematology and Oncology

## 2016-06-13 DIAGNOSIS — R011 Cardiac murmur, unspecified: Secondary | ICD-10-CM | POA: Diagnosis not present

## 2016-06-13 DIAGNOSIS — K5909 Other constipation: Secondary | ICD-10-CM | POA: Diagnosis not present

## 2016-06-13 DIAGNOSIS — Z483 Aftercare following surgery for neoplasm: Secondary | ICD-10-CM

## 2016-06-13 DIAGNOSIS — Z510 Encounter for antineoplastic radiation therapy: Secondary | ICD-10-CM | POA: Diagnosis not present

## 2016-06-13 DIAGNOSIS — C060 Malignant neoplasm of cheek mucosa: Secondary | ICD-10-CM

## 2016-06-13 DIAGNOSIS — E875 Hyperkalemia: Secondary | ICD-10-CM

## 2016-06-13 DIAGNOSIS — D61818 Other pancytopenia: Secondary | ICD-10-CM

## 2016-06-13 DIAGNOSIS — R293 Abnormal posture: Secondary | ICD-10-CM

## 2016-06-13 DIAGNOSIS — R634 Abnormal weight loss: Secondary | ICD-10-CM | POA: Diagnosis not present

## 2016-06-13 DIAGNOSIS — L599 Disorder of the skin and subcutaneous tissue related to radiation, unspecified: Secondary | ICD-10-CM

## 2016-06-13 DIAGNOSIS — R1310 Dysphagia, unspecified: Secondary | ICD-10-CM | POA: Diagnosis not present

## 2016-06-13 DIAGNOSIS — K1231 Oral mucositis (ulcerative) due to antineoplastic therapy: Secondary | ICD-10-CM

## 2016-06-13 LAB — CBC WITH DIFFERENTIAL/PLATELET
BASO%: 0.4 % (ref 0.0–2.0)
BASOS ABS: 0 10*3/uL (ref 0.0–0.1)
EOS ABS: 0.1 10*3/uL (ref 0.0–0.5)
EOS%: 2.1 % (ref 0.0–7.0)
HCT: 32.7 % — ABNORMAL LOW (ref 38.4–49.9)
HEMOGLOBIN: 11.1 g/dL — AB (ref 13.0–17.1)
LYMPH%: 13.6 % — ABNORMAL LOW (ref 14.0–49.0)
MCH: 35.5 pg — AB (ref 27.2–33.4)
MCHC: 33.9 g/dL (ref 32.0–36.0)
MCV: 104.5 fL — AB (ref 79.3–98.0)
MONO#: 0.4 10*3/uL (ref 0.1–0.9)
MONO%: 9.3 % (ref 0.0–14.0)
NEUT#: 3.5 10*3/uL (ref 1.5–6.5)
NEUT%: 74.6 % (ref 39.0–75.0)
Platelets: 116 10*3/uL — ABNORMAL LOW (ref 140–400)
RBC: 3.13 10*6/uL — ABNORMAL LOW (ref 4.20–5.82)
RDW: 13.8 % (ref 11.0–14.6)
WBC: 4.7 10*3/uL (ref 4.0–10.3)
lymph#: 0.6 10*3/uL — ABNORMAL LOW (ref 0.9–3.3)

## 2016-06-13 LAB — COMPREHENSIVE METABOLIC PANEL
ALT: 11 U/L (ref 0–55)
AST: 13 U/L (ref 5–34)
Albumin: 3.4 g/dL — ABNORMAL LOW (ref 3.5–5.0)
Alkaline Phosphatase: 65 U/L (ref 40–150)
Anion Gap: 7 mEq/L (ref 3–11)
BILIRUBIN TOTAL: 0.65 mg/dL (ref 0.20–1.20)
BUN: 18.9 mg/dL (ref 7.0–26.0)
CHLORIDE: 103 meq/L (ref 98–109)
CO2: 30 meq/L — AB (ref 22–29)
CREATININE: 1.1 mg/dL (ref 0.7–1.3)
Calcium: 9.7 mg/dL (ref 8.4–10.4)
EGFR: 68 mL/min/{1.73_m2} — ABNORMAL LOW (ref >90)
GLUCOSE: 188 mg/dL — AB (ref 70–140)
Potassium: 5.4 mEq/L — ABNORMAL HIGH (ref 3.5–5.1)
SODIUM: 139 meq/L (ref 136–145)
TOTAL PROTEIN: 6.6 g/dL (ref 6.4–8.3)

## 2016-06-13 LAB — MAGNESIUM: Magnesium: 2 mg/dl (ref 1.5–2.5)

## 2016-06-13 NOTE — Therapy (Signed)
[place], [place], [address] Phone: [phone]   Fax:  [phone]  Physical Therapy Evaluation  Patient Details  Name: Andres Smith MRN: [id number] Date of Birth: 24-Aug-1940 Referring Provider: Dr. [name]  Encounter Date: 06/13/2016      PT End of Session - 06/13/16 1720    Visit Number 1   Number of Visits 1   PT Start Time 4259   PT Stop Time 1609   PT Time Calculation (min) 40 min   Activity Tolerance Patient tolerated treatment well   Behavior During Therapy [facility] for tasks assessed/performed      Past Medical History:  Diagnosis Date  . Ankylosing spondylitis ([place])   . Blood transfusion    hx of transfusion without reaction  . Bradycardia   . Cancer ([place]) 03/2016   oral  . Cholelithiasis   . Coronary artery disease    a. s/p CABG x 6 in 1997 (LIMA->LAD, VG->RI ->OM1->OM2, VG->PDA->PLV // b. 05/2011 Cath:  patent grafs, native prox rca and d2 dzs  ->med rx. // c. Myoview 1/18: not gated, large inferolateral scar, no ischemia; Intermediate Risk (IL scar old - on prior studies >> med rx)  . Crohn's colitis ([place]) 12/01/1997  . Diabetes mellitus   . Dysphagia   . Fatty liver   . GERD (gastroesophageal reflux disease)   . Heart block   . Hiatal hernia 12/03/2008  . History of skin cancer   . Hx of echocardiogram    Echo 5/14:  Mild LVH, EF 55-60%, NL diast fxn, mild LAE   . Hyperlipidemia   . Hypertension   . Kidney stones   . Neck pain   . Pneumonia    hx of PNA  . 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 cancer removal  2014   removed from left outter leg  . COLONOSCOPY  2012  . CORONARY ARTERY BYPASS GRAFT  04/19/1995   x7  . EXCISION ORAL TUMOR Left    left jaw and lymph node  . HERNIA REPAIR  1989  . IR FLUORO GUIDE PORT INSERTION RIGHT  05/21/2016  . IR GASTROSTOMY TUBE MOD SED  05/21/2016  . IR  US GUIDE VASC ACCESS RIGHT  05/21/2016  . LEFT HEART CATHETERIZATION WITH CORONARY/GRAFT ANGIOGRAM N/A 05/24/2011   Procedure: LEFT HEART CATHETERIZATION WITH Beatrix Fetters;  Surgeon: Larey Dresser, MD;  [facility] C[facility] LAB;  Service: Cardiovascular;  Laterality: N/A;  . MOHS SURGERY     X 2 off chin and nose  . TONSILLECTOMY AND ADENOIDECTOMY  1944  . TRIGGER FINGER RELEASE    . UMBILICAL HERNIA REPAIR      There were no vitals filed for this visit.       Subjective Assessment - 06/13/16 1538    Subjective I don't know why we're here. Reports sores on the end of his tongue and back of his mouth; has itching from radiation.  Miralax has created diarrhea.   Patient is accompained by: Family member  spouse   Pertinent History patient saw Dr. Fenton Malling at [facility] who performed resection of oral buccal mucosa, selective neck dissection on 04/10/16. Free flap radial forearm reconstruction was not necessary.   Started radiation 05/23/16 and chemo 05/25/16; is now halfway through radiation.  Diabetes, cholitis, ankylosing spondylitis, CABG x 7 20 years ago.  Went through cardiac rehab at that time and has  done exercise since then until this started.  Carries nitroglycerin with him.   Patient Stated Goals get info for therapy-related concerns with having radiation to neck   Currently in Pain? No/denies            [facility] PT Assessment - 06/13/16 0001      Assessment   Medical Diagnosis buccal mucosa cancer, s/p resection and currently in XRT   Referring Provider Dr. [name]   Onset Date/Surgical Date 04/10/16   Hand Dominance Right   Prior Therapy none     Precautions   Precautions Other (comment)   Precaution Comments cancer precautions     Restrictions   Weight Bearing Restrictions No     Balance Screen   Has the patient fallen in the past 6 months No   Has the patient had a decrease in activity level because of a fear of falling?  No   Is the patient  reluctant to leave their home because of a fear of falling?  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, does not do physical work   Leisure used to do treadmill, walk or job outside, [employer], most days of the week  stopped when he started having mouth problems     Cognition   Overall Cognitive Status Within Functional Limits for tasks assessed     Observation/Other Assessments   Observations fullness at right anterior neck   Skin Integrity incision at left anterior neck from dissection well healed     Functional Tests   Functional tests Sit to Stand     Sit to Stand   Comments 13 times in 30 seconds  tired quads after this; mild dyspnea     Posture/Postural Control   Posture/Postural Control Postural limitations   Postural Limitations Forward head  significant     ROM / Strength   AROM / PROM / Strength AROM     AROM   Overall AROM Comments Neck AROM is virtually nonexistent due to ankylosing spondylitis; has just a few degrees of rotation.  Does have moderate limitations in shoulder A/ROM.     Palpation   Palpation comment tissue is soft at right anterior neck           LYMPHEDEMA/ONCOLOGY QUESTIONNAIRE - 06/13/16 1552      Type   Cancer Type buccal mucosa     Surgeries   Other Surgery Date 04/10/16     Treatment   Active Chemotherapy Treatment Yes   Active Radiation Treatment Yes     Lymphedema Assessments   Lymphedema Assessments Head and Neck     Head and Neck   4 cm superior to sternal notch around neck 38.1 cm   6 cm superior to sternal notch around neck 40.7 cm   8 cm superior to sternal notch around neck 41.7 cm         Objective measurements  completed on examination: See above findings.                  PT Education - 06/13/16 1719    Education provided Yes   Education Details walking for exercise, CURE article on staying active, lymphedema and PT  info   Person(s) Educated Patient;Spouse   Methods Explanation;Handout   Comprehension Verbalized understanding                 Head and Neck Clinic Goals - 06/13/16 1726      Patient will be able to verbalize understanding of a home exercise program for cervical range of motion, posture, and walking.    Status Achieved     Patient will be able to verbalize understanding of proper sitting and standing posture.    Status Deferred     Patient will be able to verbalize understanding of lymphedema risk and availability of treatment for this condition.    Status Achieved            Plan - 06/13/16 1720    Clinical Impression Statement Pt. with cancer of buccal mucosa, s/p resection and now in mid-chemoradiation treatment.  He is starting to have redness of skin at treated area.  He has some soft fleshy tissue at right anterior neck, but he says this is normal for him and not evidence of swelling.  He has almost no active neck motion because of ankylosing spondylitis.   History and Personal Factors relevant to plan of care: ankylosing spondylitis, DM, cardiac history   Clinical Presentation Evolving   Clinical Presentation due to: now in mid-chemoradiation treatment   Clinical Decision Making Moderate   Rehab Potential Good   PT Frequency One time visit   PT Treatment/Interventions Patient/family education   PT Next Visit Plan None at this time; he is at significan risk for lymphedema so may need therapy should that develop.   PT Home Exercise Plan walking or other aerobic exercise   Consulted and Agree with Plan of Care Patient;Family member/caregiver      Patient will benefit from skilled therapeutic intervention in order to improve  the following deficits and impairments:  Other (comment), Postural dysfunction, Decreased knowledge of precautions, Decreased range of motion (at risk for lymphedema)  Visit Diagnosis: Aftercare following surgery for neoplasm - Plan: PT plan of care cert/re-cert  Disorder of the skin and subcutaneous tissue related to radiation, unspecified - Plan: PT plan of care cert/re-cert  Abnormal posture - Plan: PT plan of care cert/re-cert      G-Codes - 90/24/09 1726    Functional Assessment Tool Used (Outpatient Only) clinical judgement   Functional Limitation Changing and maintaining body position   Changing and Maintaining Body Position Current Status (B3532) At least 1 percent but less than 20 percent impaired, limited or restricted   Changing and Maintaining Body Position Goal Status (D9242) At least 1 percent but less than 20 percent impaired, limited or restricted   Changing and Maintaining Body Position Discharge Status (A8341) At least 1 percent but less than 20 percent impaired, limited or restricted       Problem List Patient Active Problem List   Diagnosis Date Noted  . Mucositis due to chemotherapy 06/07/2016  . Pressure injury of skin, stage 1 06/07/2016  . Other constipation 06/07/2016  . Oral thrush 06/07/2016  . Acute hyperkalemia 05/31/2016  . Weight loss 05/31/2016  . 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 B12 deficiency anemia 08/03/2013  . ULCERATIVE COLITIS-UNIVERSAL 01/17/2010  . Occlusion and stenosis of carotid artery without mention of cerebral infarction 09/20/2009  . GERD 12/02/2008  . Type II diabetes mellitus with manifestations ([place]) 04/27/2008  . Hyperlipidemia with target LDL less than 70 12/04/2007  . Essential hypertension, benign 12/04/2007  .  Ankylosing spondylitis ([place]) 12/04/2007  . Coronary atherosclerosis of native coronary artery 12/04/2007  . CROHN'S [facility] INTESTINE 05/06/2007    Vergene Marland 06/13/2016, 5:29  PM  [place], [place], [address] Phone: [phone]   Fax:  [phone]  Name: Andres Smith MRN: [id number] Date of Birth: [birth year]  Serafina Royals, PT 06/13/16 5:29 PM

## 2016-06-14 ENCOUNTER — Encounter: Payer: Self-pay | Admitting: Hematology and Oncology

## 2016-06-14 ENCOUNTER — Ambulatory Visit
Admission: RE | Admit: 2016-06-14 | Discharge: 2016-06-14 | Disposition: A | Discharge status: Home / Self Care | Payer: PPO | Source: Ambulatory Visit | Attending: Radiation Oncology | Admitting: Radiation Oncology

## 2016-06-14 DIAGNOSIS — D61818 Other pancytopenia: Secondary | ICD-10-CM | POA: Insufficient documentation

## 2016-06-14 DIAGNOSIS — R011 Cardiac murmur, unspecified: Secondary | ICD-10-CM | POA: Diagnosis not present

## 2016-06-14 DIAGNOSIS — C060 Malignant neoplasm of cheek mucosa: Secondary | ICD-10-CM | POA: Diagnosis not present

## 2016-06-14 DIAGNOSIS — Z510 Encounter for antineoplastic radiation therapy: Secondary | ICD-10-CM | POA: Diagnosis not present

## 2016-06-14 NOTE — Assessment & Plan Note (Signed)
He had severe constipation recently I reinforced importance of taking daily MiraLAX and sennokot

## 2016-06-14 NOTE — Assessment & Plan Note (Addendum)
He had recent weight loss due to dysgeusia and pain We will monitor his weight carefully I recommend increase oral intake as tolerated and encouraged him to use nutritional supplement through the feeding tube

## 2016-06-14 NOTE — Progress Notes (Signed)
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 Tumor measures 0.9 cm in greatest dimension. Tumor extends to the inked deep tissue edge. Depth of invasion: 4 mm (in this material) See note.  Note: an immunostains for P16 was performed at an outside hospital and provided for [place] to review; p16 immunostain is negative. Per outside report, In situ hybridization for high-risk HPV types showed no evidence of transcriptionally active high-risk HPV types.      03/21/2016 Initial Diagnosis    Salient findings:  -EAC's clear -Lesion within left buccal mucosa (pictured below) with about 60m depth. Width is about 2/3 of the left buccal mucosal surface area by palpation. Extends to the oral commissure.  -Tongue soft and mobile without mucosal lesions -Neck soft with no palpable neck masses -Right handed -Right hand is pink and warm with Allen's test negative (normal)  -Left forearm very sun damaged with multiple treated areas from precancerous or cancerous areas. Right forearm not as dramatic, presumably from sun exposure while driving      [id number]    RESECTION OF ORAL BUCCAL MUCOSA SELECTIVE NECK DISSECTION FREE FLAP RADIAL FOREARM PR SPLIT GInkster<100 SQCM [15100] (SPLIT THICKNESS SKIN GRAFT LEG         04/13/2016 Pathology Results    A.LEFT ORAL COMMISSURE INFERIOR, EXCISION: No malignancy identified.  B.LEFT ORAL COMMISSURE SUPERIOR, EXCISION: No malignancy identified.  C.LEFT DEEP ORAL COMMISSURE, EXCISION: No malignancy identified.  D.RIGHT POSTERIOR BUCCAL INFERIOR, EXCISION: No malignancy identified.  E.RIGHT POSTERIOR BUCCAL SUPERIOR, EXCISION: No malignancy identified.  F.ORAL CAVITY, LEFT BUCCAL, EXCISION:  Invasive squamous cell carcinoma, moderately differentiated,  keratinizing. Tumor size:2.2 cm. Tumor depth of invasion:0.8 cm. Perineural invasion identified. Margins negative for malignancy. Pathologic stage:pT2 pN2a. See tumor protocol summary below.  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 Tumor deposit size:1.1 cm. Extranodal extension present. Benign salivary gland tissue.  SURGICAL PATHOLOGY CANCER CASE SUMMARY (AJCC 8TH EDITION) LIP AND ORAL CAVITY CAP Protocol posting date: June, 2017 Version: LipOralCavity 4.0.0.0  PROCEDURE: Wide resection of buccal mucosa TUMOR SITE:Oral:Buccal mucosa TUMOR LATERALITY: Left TUMOR FOCALITY: Unifocal TUMOR SIZE:Greatest dimension: 2.2 cm TUMOR DEPTH OF INVASION (DOI):8 mm HISTOLOGIC TYPE:Squamous cell carcinoma, conventional HISTOLOGIC GRADE:G2: Moderately differentiated SPECIMEN MARGINS:Uninvolved by invasive carcinoma  DISTANCE FROM CLOSEST MARGIN:1 mm  SPECIFY MARGIN(S):Deep LYMPHOVASCULAR INVASION:Present PERINEURAL INVASION: Present REGIONAL LYMPH NODES: NUMBER OF LYMPH NODES INVOLVED: 1 NUMBER OF LYMPH NODES EXAMINED: 4 LATERALITY OF LYMPH NODES INVOLVED: Ipsilateral SIZE OF LARGEST METASTATIC DEPOSIT:1.1 cm EXTRANODAL EXTENSION:Present PATHOLOGIC STAGE CLASSIFICATION (pTNM, AJCC 8th  Ed): pT2 pN2a pT2: umor >2 cm but <=4 cm and <=10 mm DOI pN2a:Metastasis in a single ipsilateral lymph node, 3 cm or  smaller in greatest dimension and ENE(+) BIOMARKERS:performed on prior outside biopsy ([facility] case [id number] outside case [id number] P16 (Immunohistochemistry):Negative HPV (high-risk types by in situ hybridization):Reported as negative      05/11/2016 Imaging    1.  4 mm left lower lobe pulmonary nodule. Attention on follow-up imaging recommended as metastatic disease not excluded. 2. 2.8 cm left thyroid nodule. Thyroid ultrasound recommended to further evaluate. 3. Bilateral renal cysts. 4. Coronary artery and thoracoabdominal aortic atherosclerosis. 5. Ankylosing spondylitis.        05/11/2016 Imaging    CT neck: Surgical clips in the region of the left buccal mucosa, presumably at the previous primary site. Mild scarring in that region measuring about 8 mm. Cannot assess for residual or recurrent disease in that location, but this may simply be scarring. Previous left submandibular resection and left neck node dissection. No abnormal nodes presently. Multinodular goiter. Ankylosing spondylitis.  C1-2 remains a mobile segment.      05/21/2016 Procedure    1. Successful placement of a right internal jugular approach power injectable Port-A-Cath. The Port a catheter is ready for immediate use. 2. Successful fluoroscopic insertion of a 20-French pull-through gastrostomy tube. The gastrostomy may be used immediately for medication administration and may be utilized in 24 hrs for the initiation of feeds.      05/23/2016 -  Radiation Therapy    He received radiation treatment       05/25/2016 -  Chemotherapy    He received weekly reduced dose cisplatin        INTERVAL HISTORY: Please see below for problem oriented charting. He returns for further follow-up He has lost some weight since the last time I saw him The reason for weight loss  is due to altered taste sensation and pain He is not using prescription pain medicine as prescribed He had recent constipation again, resolved with MiraLAX and Senokot He had no nausea vomiting Denies peripheral neuropathy or tinnitus  REVIEW OF SYSTEMS:   Constitutional: Denies fevers, chills  Eyes: Denies blurriness of vision Respiratory: Denies cough, dyspnea or wheezes Cardiovascular: Denies palpitation, chest discomfort or lower extremity swelling Skin: Denies abnormal skin rashes Lymphatics: Denies new lymphadenopathy or easy bruising Neurological:Denies numbness, tingling or new weaknesses Behavioral/Psych: Mood is stable, no new changes  All other systems were reviewed with the patient and are negative.  I have reviewed the past medical history, past surgical history, social history and family history with the patient and they are unchanged from previous note.  ALLERGIES:  has No Known Allergies.  MEDICATIONS:  Current Outpatient Prescriptions  Medication Sig Dispense Refill  . aspirin EC 81 MG tablet Take 81 mg by mouth daily.    Marland Kitchen atorvastatin (LIPITOR) 80 MG tablet Take 80 mg by mouth at bedtime.    . cyanocobalamin (,VITAMIN B-12,) 1000 MCG/ML injection INJECT 1ML ONCE MONTHLY. 3 mL 1  . isosorbide mononitrate (IMDUR) 60 MG 24 hr tablet Take 90 mg by mouth daily.    Marland Kitchen lidocaine (XYLOCAINE) 2 % solution Patient: Mix 1part 2% viscous lidocaine, 1part H20. Swish & swallow 9m of diluted mixture, 341m before meals and at bedtime, up to QID 100 mL 5  . lidocaine-prilocaine (EMLA) cream Apply to affected area once 30 g 3  . mercaptopurine (PURINETHOL) 50 MG tablet TAKE ONE TABLET ONCE A DAY ON AN EMPTY STOMACH 1 HR BEFORE OR 2HRS AFTER A MEAL 30 tablet 2  . mesalamine (LIALDA) 1.2 g EC tablet Take 2.4 g by mouth daily with breakfast.    . metFORMIN (GLUCOPHAGE) 500 MG tablet Take 1 tablet (500 mg total) by mouth 2 (two) times daily with a meal. Must Keep 05/16/15 appt for  future refills 180 tablet 0  . nystatin (  MYCOSTATIN) 100000 UNIT/ML suspension Take 5 mLs (500,000 Units total) by mouth 4 (four) times daily. 200 mL 0  . fentaNYL (DURAGESIC - DOSED MCG/HR) 25 MCG/HR patch Place 1 patch (25 mcg total) onto the skin every 3 (three) days. (Patient not taking: Reported on 06/11/2016) 5 patch 0  . Morphine Sulfate (MORPHINE CONCENTRATE) 10 mg / 0.5 ml concentrated solution Place 0.5 mLs (10 mg total) into feeding tube every 2 (two) hours as needed for severe pain. (Patient not taking: Reported on 06/11/2016) 120 mL 0  . nitroGLYCERIN (NITROSTAT) 0.4 MG SL tablet Place 0.4 mg under the tongue every 5 (five) minutes as needed for chest pain.    Marland Kitchen ondansetron (ZOFRAN) 8 MG tablet Take 1 tablet (8 mg total) by mouth 2 (two) times daily as needed. Start on the third day after chemotherapy. (Patient not taking: Reported on 06/13/2016) 30 tablet 1  . oxyCODONE (OXY IR/ROXICODONE) 5 MG immediate release tablet Take 5 mg by mouth every 6 (six) hours as needed for severe pain.    Marland Kitchen prochlorperazine (COMPAZINE) 10 MG tablet Take 1 tablet (10 mg total) by mouth every 6 (six) hours as needed (Nausea or vomiting). (Patient not taking: Reported on 06/13/2016) 30 tablet 1   No current facility-administered medications for this visit.     PHYSICAL EXAMINATION: ECOG PERFORMANCE STATUS: 1 - Symptomatic but completely ambulatory  Vitals:   06/13/16 1145 06/13/16 1201  BP: (!) 161/129 (!) 98/53  Pulse: 68   Resp: 18   Temp: 98.7 F (37.1 C)    Filed Weights   06/13/16 1145  Weight: 179 lb 6.4 oz (81.4 kg)    GENERAL:alert, no distress and comfortable SKIN: Noted skin rash around his neck.  No ulceration EYES: normal, Conjunctiva are pink and non-injected, sclera clear OROPHARYNX: noted mucositis.  No thrush NECK: supple, thyroid normal size, non-tender, without nodularity LYMPH:  no palpable lymphadenopathy in the cervical, axillary or inguinal LUNGS: clear to auscultation and  percussion with normal breathing effort HEART: regular rate & rhythm and no murmurs and no lower extremity edema ABDOMEN:abdomen soft, non-tender and normal bowel sounds.  Feeding tube site looks okay Musculoskeletal:no cyanosis of digits and no clubbing  NEURO: alert & oriented x 3 with fluent speech, no focal motor/sensory deficits  LABORATORY DATA:  I have reviewed the data as listed    Component Value Date/Time   NA 139 06/13/2016 1132   K 5.4 (H) 06/13/2016 1132   CL 102 05/22/2016 1020   CO2 30 (H) 06/13/2016 1132   GLUCOSE 188 (H) 06/13/2016 1132   GLUCOSE 98 12/12/2005 0729   BUN 18.9 06/13/2016 1132   CREATININE 1.1 06/13/2016 1132   CALCIUM 9.7 06/13/2016 1132   PROT 6.6 06/13/2016 1132   ALBUMIN 3.4 (L) 06/13/2016 1132   AST 13 06/13/2016 1132   ALT 11 06/13/2016 1132   ALKPHOS 65 06/13/2016 1132   BILITOT 0.65 06/13/2016 1132   GFRNONAA >60 05/22/2016 1020   GFRAA >60 05/22/2016 1020    No results found for: SPEP, UPEP  Lab Results  Component Value Date   WBC 4.7 06/13/2016   NEUTROABS 3.5 06/13/2016   HGB 11.1 (L) 06/13/2016   HCT 32.7 (L) 06/13/2016   MCV 104.5 (H) 06/13/2016   PLT 116 (L) 06/13/2016      Chemistry      Component Value Date/Time   NA 139 06/13/2016 1132   K 5.4 (H) 06/13/2016 1132   CL 102 05/22/2016 1020   CO2  30 (H) 06/13/2016 1132   BUN 18.9 06/13/2016 1132   CREATININE 1.1 06/13/2016 1132      Component Value Date/Time   CALCIUM 9.7 06/13/2016 1132   ALKPHOS 65 06/13/2016 1132   AST 13 06/13/2016 1132   ALT 11 06/13/2016 1132   BILITOT 0.65 06/13/2016 1132       RADIOGRAPHIC STUDIES: I have personally reviewed the radiological images as listed and agreed with the findings in the report. Dg Chest 2 View  Result Date: 05/22/2016 CLINICAL DATA:  Syncope. EXAM: CHEST  2 VIEW COMPARISON:  Radiograph of May 21, 2016. FINDINGS: Stable cardiomediastinal silhouette. Status post coronary artery bypass graft. No pneumothorax or  pleural effusion is noted. No acute pulmonary disease is noted. Right internal jugular Port-A-Cath is noted with distal tip in expected position of cavoatrial junction. Bony thorax is unremarkable. IMPRESSION: No active cardiopulmonary disease. Electronically Signed   By: Marijo Conception, M.D.   On: 05/22/2016 11:59   Ct Abdomen Pelvis W Contrast  Result Date: 05/21/2016 CLINICAL DATA:  Acute onset of shortness of breath and generalized abdominal pain. Recent G-tube placement. Initial encounter. EXAM: CT ABDOMEN AND PELVIS WITH CONTRAST TECHNIQUE: Multidetector CT imaging of the abdomen and pelvis was performed using the standard protocol following bolus administration of intravenous contrast. CONTRAST:  137m ISOVUE-300 IOPAMIDOL (ISOVUE-300) INJECTION 61% COMPARISON:  CT of the abdomen and pelvis performed 05/11/2016 FINDINGS: Lower chest: Diffuse coronary artery calcifications are seen. The patient is status post median sternotomy. Bibasilar atelectasis or scarring is noted. Hepatobiliary: The liver is unremarkable in appearance. A stone is noted dependently within the gallbladder. The gallbladder is otherwise unremarkable. The common bile duct remains normal in caliber. Pancreas: The pancreas is within normal limits. Spleen: The spleen is unremarkable in appearance. Adrenals/Urinary Tract: The adrenal glands are unremarkable in appearance. Bilateral renal cysts are noted. Nonspecific perinephric stranding is noted bilaterally. There is no evidence of hydronephrosis. No renal or ureteral stones are identified. Stomach/Bowel: The patient's G-tube is noted at the body of the stomach. A small amount of free air is seen tracking within the abdomen, reflecting recent surgery. The small bowel is within normal limits. The appendix is not visualized; there is no evidence for appendicitis. There is wall thickening along the distal sigmoid colon and rectum, raising question for mild proctitis. The colon is otherwise  unremarkable in appearance. Vascular/Lymphatic: Scattered calcification is seen along the abdominal aorta and its branches. The abdominal aorta is otherwise grossly unremarkable. The inferior vena cava is grossly unremarkable. No retroperitoneal lymphadenopathy is seen. No pelvic sidewall lymphadenopathy is identified. Reproductive: The bladder is mildly distended. Mild bladder wall thickening could reflect cystitis. The prostate is enlarged, measuring 5.6 cm in transverse dimension, with mild heterogeneity. Other: A small left inguinal hernia is noted, containing only fat. Musculoskeletal: No acute osseous abnormalities are identified. Anterior bridging osteophytes are noted along the lower thoracic and upper lumbar spine. Facet disease is noted along the lumbar spine. The visualized musculature is unremarkable in appearance. IMPRESSION: 1. Wall thickening along the distal sigmoid colon rectum raises question for mild proctitis. 2. Mild bladder wall thickening could reflect cystitis. 3. Postoperative free air within the abdomen, given recent surgery. G-tube noted ending at the body of the stomach. 4. Diffuse coronary artery calcifications seen. 5. Cholelithiasis.  Gallbladder otherwise unremarkable. 6. Bilateral renal cysts noted. 7. Scattered aortic atherosclerosis. 8. Enlarged prostate, with mild heterogeneity. Would correlate with PSA. 9. Small left inguinal hernia, containing only fat. 10. Anterior bridging  osteophytes along the lower thoracic and upper lumbar spine. Electronically Signed   By: Garald Balding M.D.   On: 05/21/2016 19:21   Ir Gastrostomy Tube  Result Date: 05/21/2016 INDICATION: History of head neck cancer, in need of durable intravenous access for chemotherapy administration. In need of enteric access for enteric nutrition supplementation prior to initiation of chemoradiation. EXAM: 1. IMPLANTED PORT A CATH PLACEMENT WITH ULTRASOUND AND FLUOROSCOPIC GUIDANCE 2. FLUOROSCOPIC GUIDED  PERCUTANEOUS GASTROSTOMY TUBE PLACEMENT COMPARISON:  CT the chest, abdomen pelvis - 05/12/2018 MEDICATIONS: Glucagon 1 mg IV; Ancef 2 g IV; The antibiotic was administered within an appropriate time interval prior to skin puncture. ANESTHESIA/SEDATION: Moderate (conscious) sedation was employed during this procedure. A total of Versed 6 mg and Fentanyl 100 mcg was administered intravenously. Moderate Sedation Time: 45 minutes. The patient's level of consciousness and vital signs were monitored continuously by radiology nursing throughout the procedure under my direct supervision. CONTRAST:  20 - cc Isovue 300, administered into the stomach lumen. FLUOROSCOPY TIME:  A total of 1 minute, 54 seconds (38.2 mGy) fluoroscopy time was utilized for both procedures. COMPLICATIONS: None immediate. PROCEDURE: The procedures, risks, benefits, and alternatives were explained to the patient. Questions regarding the procedure were encouraged and answered. The patient understands and consents to both procedures. Attention was initially paid towards placement of the [facility] a Catheter. The right neck and chest were prepped with chlorhexidine in a sterile fashion, and a sterile drape was applied covering the operative field. Maximum barrier sterile technique with sterile gowns and gloves were used for the procedure. A timeout was performed prior to the initiation of the procedure. Local anesthesia was provided with 1% lidocaine with epinephrine. After creating a small venotomy incision, a micropuncture kit was utilized to access the internal jugular vein under direct, real-time ultrasound guidance. Ultrasound image documentation was performed. The microwire was kinked to measure appropriate catheter length. A subcutaneous port pocket was then created along the upper chest wall utilizing a combination of [place] and blunt dissection. The pocket was irrigated with sterile saline. A single lumen thin power injectable port was chosen for  placement. The 8 Fr catheter was tunneled from the port pocket site to the venotomy incision. The port was placed in the pocket. The external catheter was trimmed to appropriate length. At the venotomy, an 8 Fr peel-away sheath was placed over a guidewire under fluoroscopic guidance. The catheter was then placed through the sheath and the sheath was removed. Final catheter positioning was confirmed and documented with a fluoroscopic spot radiograph. The port was accessed with a Huber needle, aspirated and flushed with heparinized saline. The venotomy site was closed with an interrupted 4-0 Vicryl suture. The port pocket incision was closed with interrupted 2-0 Vicryl suture and the skin was opposed with a running subcuticular 4-0 Vicryl suture. Dermabond and Steri-strips were applied to both incisions. Dressings were placed. ____________________________________________ Next, attention was paid towards placement of the gastrostomy tube. The left upper quadrant was sterilely prepped and draped. An oral gastric catheter was inserted into the stomach under fluoroscopy. The existing nasogastric feeding tube was removed. The left costal margin and air / barium opacified transverse colon were identified and avoided. Air was injected into the stomach for insufflation and visualization under fluoroscopy. Under sterile conditions a 17 gauge trocar needle was utilized to access the stomach percutaneously beneath the left subcostal margin after the overlying soft tissues were anesthetized with 1% Lidocaine with epinephrine. Needle position was confirmed within the stomach with  aspiration of air and injection of small amount of contrast. A single T tack was deployed for gastropexy. Over an Amplatz guide wire, a 9-French sheath was inserted into the stomach. A snare device was utilized to capture the oral gastric catheter. The snare device was pulled retrograde from the stomach up the esophagus and out the oropharynx. The  20-French pull-through gastrostomy was connected to the snare device and pulled antegrade through the oropharynx down the esophagus into the stomach and then through the percutaneous tract external to the patient. The gastrostomy was assembled externally. Contrast injection confirms position in the stomach. Several spot radiographic images were obtained in various obliquities for documentation. Dressings were placed. The patient tolerated both above procedures well without immediate post procedural complication. FINDINGS: After catheter placement, the tip lies within the superior cavoatrial junction. The catheter aspirates and flushes normally and is ready for immediate use. After successful fluoroscopic guided placement, the gastrostomy tube is appropriately positioned with internal disc against the ventral aspect of the gastric lumen. IMPRESSION: 1. Successful placement of a right internal jugular approach power injectable Port-A-Cath. The Port a catheter is ready for immediate use. 2. Successful fluoroscopic insertion of a 20-French pull-through gastrostomy tube. The gastrostomy may be used immediately for medication administration and may be utilized in 24 hrs for the initiation of feeds. Electronically Signed   By: Sandi Mariscal M.D.   On: 05/21/2016 12:48   Ir US Guide Vasc Access Right  Result Date: 05/21/2016 INDICATION: History of head neck cancer, in need of durable intravenous access for chemotherapy administration. In need of enteric access for enteric nutrition supplementation prior to initiation of chemoradiation. EXAM: 1. IMPLANTED PORT A CATH PLACEMENT WITH ULTRASOUND AND FLUOROSCOPIC GUIDANCE 2. FLUOROSCOPIC GUIDED PERCUTANEOUS GASTROSTOMY TUBE PLACEMENT COMPARISON:  CT the chest, abdomen pelvis - 05/12/2018 MEDICATIONS: Glucagon 1 mg IV; Ancef 2 g IV; The antibiotic was administered within an appropriate time interval prior to skin puncture. ANESTHESIA/SEDATION: Moderate (conscious) sedation was  employed during this procedure. A total of Versed 6 mg and Fentanyl 100 mcg was administered intravenously. Moderate Sedation Time: 45 minutes. The patient's level of consciousness and vital signs were monitored continuously by radiology nursing throughout the procedure under my direct supervision. CONTRAST:  20 - cc Isovue 300, administered into the stomach lumen. FLUOROSCOPY TIME:  A total of 1 minute, 54 seconds (38.2 mGy) fluoroscopy time was utilized for both procedures. COMPLICATIONS: None immediate. PROCEDURE: The procedures, risks, benefits, and alternatives were explained to the patient. Questions regarding the procedure were encouraged and answered. The patient understands and consents to both procedures. Attention was initially paid towards placement of the [facility] a Catheter. The right neck and chest were prepped with chlorhexidine in a sterile fashion, and a sterile drape was applied covering the operative field. Maximum barrier sterile technique with sterile gowns and gloves were used for the procedure. A timeout was performed prior to the initiation of the procedure. Local anesthesia was provided with 1% lidocaine with epinephrine. After creating a small venotomy incision, a micropuncture kit was utilized to access the internal jugular vein under direct, real-time ultrasound guidance. Ultrasound image documentation was performed. The microwire was kinked to measure appropriate catheter length. A subcutaneous port pocket was then created along the upper chest wall utilizing a combination of [place] and blunt dissection. The pocket was irrigated with sterile saline. A single lumen thin power injectable port was chosen for placement. The 8 Fr catheter was tunneled from the port pocket site to the venotomy  incision. The port was placed in the pocket. The external catheter was trimmed to appropriate length. At the venotomy, an 8 Fr peel-away sheath was placed over a guidewire under fluoroscopic guidance. The  catheter was then placed through the sheath and the sheath was removed. Final catheter positioning was confirmed and documented with a fluoroscopic spot radiograph. The port was accessed with a Huber needle, aspirated and flushed with heparinized saline. The venotomy site was closed with an interrupted 4-0 Vicryl suture. The port pocket incision was closed with interrupted 2-0 Vicryl suture and the skin was opposed with a running subcuticular 4-0 Vicryl suture. Dermabond and Steri-strips were applied to both incisions. Dressings were placed. ____________________________________________ Next, attention was paid towards placement of the gastrostomy tube. The left upper quadrant was sterilely prepped and draped. An oral gastric catheter was inserted into the stomach under fluoroscopy. The existing nasogastric feeding tube was removed. The left costal margin and air / barium opacified transverse colon were identified and avoided. Air was injected into the stomach for insufflation and visualization under fluoroscopy. Under sterile conditions a 17 gauge trocar needle was utilized to access the stomach percutaneously beneath the left subcostal margin after the overlying soft tissues were anesthetized with 1% Lidocaine with epinephrine. Needle position was confirmed within the stomach with aspiration of air and injection of small amount of contrast. A single T tack was deployed for gastropexy. Over an Amplatz guide wire, a 9-French sheath was inserted into the stomach. A snare device was utilized to capture the oral gastric catheter. The snare device was pulled retrograde from the stomach up the esophagus and out the oropharynx. The 20-French pull-through gastrostomy was connected to the snare device and pulled antegrade through the oropharynx down the esophagus into the stomach and then through the percutaneous tract external to the patient. The gastrostomy was assembled externally. Contrast injection confirms position in  the stomach. Several spot radiographic images were obtained in various obliquities for documentation. Dressings were placed. The patient tolerated both above procedures well without immediate post procedural complication. FINDINGS: After catheter placement, the tip lies within the superior cavoatrial junction. The catheter aspirates and flushes normally and is ready for immediate use. After successful fluoroscopic guided placement, the gastrostomy tube is appropriately positioned with internal disc against the ventral aspect of the gastric lumen. IMPRESSION: 1. Successful placement of a right internal jugular approach power injectable Port-A-Cath. The Port a catheter is ready for immediate use. 2. Successful fluoroscopic insertion of a 20-French pull-through gastrostomy tube. The gastrostomy may be used immediately for medication administration and may be utilized in 24 hrs for the initiation of feeds. Electronically Signed   By: Sandi Mariscal M.D.   On: 05/21/2016 12:48   Dg Chest Port 1 View  Result Date: 05/21/2016 CLINICAL DATA:  Shortness of Breath EXAM: PORTABLE CHEST 1 VIEW COMPARISON:  05/11/2016 FINDINGS: Cardiac shadow is within normal limits. Postoperative changes are seen. Right chest wall port is noted. No focal infiltrate or sizable effusion is seen. No acute bony abnormality is noted. IMPRESSION: No acute abnormality seen. Electronically Signed   By: [name] M.D.   On: 05/21/2016 17:17   Dg Abd Portable 1 View  Result Date: 05/21/2016 CLINICAL DATA:  Abdominal distension and recent G-tube placement EXAM: PORTABLE ABDOMEN - 1 VIEW COMPARISON:  None. FINDINGS: Scattered large and small bowel gas is noted. The small bowel loops are mildly prominent likely representing an ileus. Gastrostomy catheter is noted just to the left of the midline. No  other focal abnormality is seen. IMPRESSION: Changes most consistent with a small-bowel ileus. Although not mentioned in the body of the report there may  be a small amount of free intraperitoneal air likely related to the recent gastrostomy placement. Electronically Signed   By: [name] M.D.   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 In need of enteric access for enteric nutrition supplementation prior to initiation of chemoradiation. EXAM: 1. IMPLANTED PORT A CATH PLACEMENT WITH ULTRASOUND AND FLUOROSCOPIC GUIDANCE 2. FLUOROSCOPIC GUIDED PERCUTANEOUS GASTROSTOMY TUBE PLACEMENT COMPARISON:  CT the chest, abdomen pelvis - 05/12/2018 MEDICATIONS: Glucagon 1 mg IV; Ancef 2 g IV; The antibiotic was administered within an appropriate time interval prior to skin puncture. ANESTHESIA/SEDATION: Moderate (conscious) sedation was employed during this procedure. A total of Versed 6 mg and Fentanyl 100 mcg was administered intravenously. Moderate Sedation Time: 45 minutes. The patient's level of consciousness and vital signs were monitored continuously by radiology nursing throughout the procedure under my direct supervision. CONTRAST:  20 - cc Isovue 300, administered into the stomach lumen. FLUOROSCOPY TIME:  A total of 1 minute, 54 seconds (38.2 mGy) fluoroscopy time was utilized for both procedures. COMPLICATIONS: None immediate. PROCEDURE: The procedures, risks, benefits, and alternatives were explained to the patient. Questions regarding the procedure were encouraged and answered. The patient understands and consents to both procedures. Attention was initially paid towards placement of the [facility] a Catheter. The right neck and chest were prepped with chlorhexidine in a sterile fashion, and a sterile drape was applied covering the operative field. Maximum barrier sterile technique with sterile gowns and gloves were used for the procedure. A timeout was performed prior to the initiation of the procedure. Local  anesthesia was provided with 1% lidocaine with epinephrine. After creating a small venotomy incision, a micropuncture kit was utilized to access the internal jugular vein under direct, real-time ultrasound guidance. Ultrasound image documentation was performed. The microwire was kinked to measure appropriate catheter length. A subcutaneous port pocket was then created along the upper chest wall utilizing a combination of [place] and blunt dissection. The pocket was irrigated with sterile saline. A single lumen thin power injectable port was chosen for placement. The 8 Fr catheter was tunneled from the port pocket site to the venotomy incision. The port was placed in the pocket. The external catheter was trimmed to appropriate length. At the venotomy, an 8 Fr peel-away sheath was placed over a guidewire under fluoroscopic guidance. The catheter was then placed through the sheath and the sheath was removed. Final catheter positioning was confirmed and documented with a fluoroscopic spot radiograph. The port was accessed with a Huber needle, aspirated and flushed with heparinized saline. The venotomy site was closed with an interrupted 4-0 Vicryl suture. The port pocket incision was closed with interrupted 2-0 Vicryl suture and the skin was opposed with a running subcuticular 4-0 Vicryl suture. Dermabond and Steri-strips were applied to both incisions. Dressings were placed. ____________________________________________ Next, attention was paid towards placement of the gastrostomy tube. The left upper quadrant was sterilely prepped and draped. An oral gastric catheter was inserted into the stomach under fluoroscopy. The existing nasogastric feeding tube was removed. The left costal margin and air / barium opacified transverse colon were identified and avoided. Air was injected into the stomach for insufflation and visualization under fluoroscopy. Under sterile conditions a 17 gauge trocar  needle was utilized to access the  stomach percutaneously beneath the left subcostal margin after the overlying soft tissues were anesthetized with 1% Lidocaine with epinephrine. Needle position was confirmed within the stomach with aspiration of air and injection of small amount of contrast. A single T tack was deployed for gastropexy. Over an Amplatz guide wire, a 9-French sheath was inserted into the stomach. A snare device was utilized to capture the oral gastric catheter. The snare device was pulled retrograde from the stomach up the esophagus and out the oropharynx. The 20-French pull-through gastrostomy was connected to the snare device and pulled antegrade through the oropharynx down the esophagus into the stomach and then through the percutaneous tract external to the patient. The gastrostomy was assembled externally. Contrast injection confirms position in the stomach. Several spot radiographic images were obtained in various obliquities for documentation. Dressings were placed. The patient tolerated both above procedures well without immediate post procedural complication. FINDINGS: After catheter placement, the tip lies within the superior cavoatrial junction. The catheter aspirates and flushes normally and is ready for immediate use. After successful fluoroscopic guided placement, the gastrostomy tube is appropriately positioned with internal disc against the ventral aspect of the gastric lumen. IMPRESSION: 1. Successful placement of a right internal jugular approach power injectable Port-A-Cath. The Port a catheter is ready for immediate use. 2. Successful fluoroscopic insertion of a 20-French pull-through gastrostomy tube. The gastrostomy may be used immediately for medication administration and may be utilized in 24 hrs for the initiation of feeds. Electronically Signed   By: Sandi Mariscal M.D.   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 with expected side  effects We will proceed with treatment without dose adjustment I will see him on a weekly basis for supportive care  Pancytopenia, acquired ([place]) The patient has pancytopenia likely due to recent chemotherapy He does not need blood transfusion We will proceed with reduced dose treatment as scheduled  Acute hyperkalemia He had recent hyperkalemia for some unknown reason He had significant recent constipation We will monitor carefully We will proceed with treatment tomorrow without dose adjustment  I have discussed with the pharmacist to omit potassium chloride in the pre- and post-hydration fluids The patient is prescribed aggressive laxative therapy  Weight loss He had recent weight loss due to dysgeusia and pain We will monitor his weight carefully I recommend increase oral intake as tolerated and encouraged him to use nutritional supplement through the feeding tube  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 I recommend he start using pain medicine as prescribed regularly.  I will reassess pain control next week  Other constipation He had severe constipation recently I reinforced importance of taking daily MiraLAX and sennokot   No orders of the defined types were placed in this encounter.  All questions were answered. The patient knows to call the clinic with any problems, questions or concerns. No barriers to learning was detected. I spent 20 minutes counseling the patient face to face. The total time spent in the appointment was 30 minutes and more than 50% was on counseling and review of test results     [name], MD 06/14/2016 1:58 PM

## 2016-06-14 NOTE — Assessment & Plan Note (Signed)
The patient has pancytopenia likely due to recent chemotherapy He does not need blood transfusion We will proceed with reduced dose treatment as scheduled

## 2016-06-14 NOTE — Assessment & Plan Note (Signed)
I have a long discussion with the patient and his wife regarding chronic pain management. I recommend he start using pain medicine as prescribed regularly.  I will reassess pain control next week

## 2016-06-14 NOTE — Assessment & Plan Note (Signed)
He had recent hyperkalemia for some unknown reason He had significant recent constipation We will monitor carefully We will proceed with treatment tomorrow without dose adjustment  I have discussed with the pharmacist to omit potassium chloride in the pre- and post-hydration fluids The patient is prescribed aggressive laxative therapy

## 2016-06-14 NOTE — Assessment & Plan Note (Signed)
He tolerated chemotherapy well with expected side effects We will proceed with treatment without dose adjustment I will see him on a weekly basis for supportive care

## 2016-06-15 ENCOUNTER — Ambulatory Visit
Admission: RE | Admit: 2016-06-15 | Discharge: 2016-06-15 | Disposition: A | Discharge status: Home / Self Care | Payer: PPO | Source: Ambulatory Visit | Attending: Radiation Oncology | Admitting: Radiation Oncology

## 2016-06-15 ENCOUNTER — Other Ambulatory Visit: Payer: Self-pay

## 2016-06-15 ENCOUNTER — Ambulatory Visit (HOSPITAL_BASED_OUTPATIENT_CLINIC_OR_DEPARTMENT_OTHER): Payer: PPO

## 2016-06-15 ENCOUNTER — Ambulatory Visit: Payer: PPO | Admitting: Nutrition

## 2016-06-15 VITALS — BP 134/55 | HR 58 | Temp 97.9°F | Resp 18

## 2016-06-15 DIAGNOSIS — R55 Syncope and collapse: Secondary | ICD-10-CM

## 2016-06-15 DIAGNOSIS — R001 Bradycardia, unspecified: Secondary | ICD-10-CM

## 2016-06-15 DIAGNOSIS — C060 Malignant neoplasm of cheek mucosa: Secondary | ICD-10-CM

## 2016-06-15 DIAGNOSIS — R011 Cardiac murmur, unspecified: Secondary | ICD-10-CM | POA: Diagnosis not present

## 2016-06-15 DIAGNOSIS — Z5111 Encounter for antineoplastic chemotherapy: Secondary | ICD-10-CM

## 2016-06-15 DIAGNOSIS — Z510 Encounter for antineoplastic radiation therapy: Secondary | ICD-10-CM | POA: Diagnosis not present

## 2016-06-15 MED ORDER — SODIUM CHLORIDE 0.9% FLUSH
10.0000 mL | INTRAVENOUS | Status: DC | PRN
  Administered 2016-06-15: 10 mL
  Filled 2016-06-15: qty 10

## 2016-06-15 MED ORDER — SODIUM CHLORIDE 0.9 % IV SOLN
Freq: Once | INTRAVENOUS | Status: AC
  Administered 2016-06-15: 12:00:00 via INTRAVENOUS
  Filled 2016-06-15: qty 5

## 2016-06-15 MED ORDER — SODIUM CHLORIDE 0.9 % IV SOLN
Freq: Once | INTRAVENOUS | Status: AC
  Administered 2016-06-15: 10:00:00 via INTRAVENOUS

## 2016-06-15 MED ORDER — PALONOSETRON HCL INJECTION 0.25 MG/5ML
INTRAVENOUS | Status: AC
  Filled 2016-06-15: qty 5

## 2016-06-15 MED ORDER — MAGNESIUM SULFATE 50 % IJ SOLN
Freq: Once | INTRAVENOUS | Status: AC
  Administered 2016-06-15: 10:00:00 via INTRAVENOUS
  Filled 2016-06-15: qty 1000

## 2016-06-15 MED ORDER — HEPARIN SOD (PORK) LOCK FLUSH 100 UNIT/ML IV SOLN
500.0000 [IU] | Freq: Once | INTRAVENOUS | Status: AC | PRN
  Administered 2016-06-15: 500 [IU]
  Filled 2016-06-15: qty 5

## 2016-06-15 MED ORDER — CISPLATIN CHEMO INJECTION 100MG/100ML
30.0000 mg/m2 | Freq: Once | INTRAVENOUS | Status: AC
  Administered 2016-06-15: 60 mg via INTRAVENOUS
  Filled 2016-06-15: qty 60

## 2016-06-15 MED ORDER — PALONOSETRON HCL INJECTION 0.25 MG/5ML
0.2500 mg | Freq: Once | INTRAVENOUS | Status: AC
  Administered 2016-06-15: 0.25 mg via INTRAVENOUS

## 2016-06-15 NOTE — Patient Instructions (Signed)
[facility] Discharge Instructions for Patients Receiving Chemotherapy  Today you received the following chemotherapy agents: Cisplatin   To help prevent nausea and vomiting after your treatment, we encourage you to take your nausea medication as directed.    If you develop nausea and vomiting that is not controlled by your nausea medication, call the clinic.   BELOW ARE SYMPTOMS THAT SHOULD BE REPORTED IMMEDIATELY:  *FEVER GREATER THAN 100.5 F  *CHILLS WITH OR WITHOUT FEVER  NAUSEA AND VOMITING THAT IS NOT CONTROLLED WITH YOUR NAUSEA MEDICATION  *UNUSUAL SHORTNESS OF BREATH  *UNUSUAL BRUISING OR BLEEDING  TENDERNESS IN MOUTH AND THROAT WITH OR WITHOUT PRESENCE OF ULCERS  *URINARY PROBLEMS  *BOWEL PROBLEMS  UNUSUAL RASH Items with * indicate a potential emergency and should be followed up as soon as possible.  Feel free to call the clinic you have any questions or concerns. The clinic phone number is [phone].  Please show the CHEMO ALERT CARD at check-in to the Emergency Department and triage nurse.   

## 2016-06-15 NOTE — Progress Notes (Signed)
Nutrition follow-up completed with patient diagnosed with left buccal mucosa cancer receiving weekly cisplatin adjuvant radiation therapy. Patient status post feeding tube on May 21. Weight has decreased and was documented as 179.4 pounds on June 13 decreased from 201 pounds in January 2018. Patient reports he is eating a little bit at mealtimes.  He tolerates foods like grits, pudding, creamed potatoes and shakes. He has lost all taste of food. He is positive for a 21.6 pound weight loss over 6 months, which is 11%.  Nutrition diagnosis: Unintended weight loss continues.  Severe malnutrition in the context of chronic illness secondary to greater than 10% weight loss in 6 months and less than 75% of energy intake for greater than one month.  Estimated nutrition needs: 2340-2540 calories, 106-120 grams protein, 2.5 L fluid.  Intervention:  I educated patient to begin Osmolite 1.5 with 60 mL free water via PEG 3 times a day between meals. Patient demonstrated tube feeding techniques. Educated patient to continue to eat food by mouth and encouraged increasing Ensure Plus twice a day to 3 times a day with meals. Provided written information.  Questions were answered. Teach back method used.  Monitoring, evaluation, goals: Patient will tolerate tube feeding and oral intake to minimize weight loss.  Next visit: Tuesday, June 19, before radiation therapy.  **Disclaimer: This note was dictated with voice recognition software. Similar sounding words can inadvertently be transcribed and this note may contain transcription errors which may not have been corrected upon publication of note.**

## 2016-06-18 ENCOUNTER — Ambulatory Visit
Admission: RE | Admit: 2016-06-18 | Discharge: 2016-06-18 | Disposition: A | Discharge status: Home / Self Care | Payer: PPO | Source: Ambulatory Visit | Attending: Radiation Oncology | Admitting: Radiation Oncology

## 2016-06-18 ENCOUNTER — Encounter: Payer: Self-pay | Admitting: Radiation Oncology

## 2016-06-18 VITALS — Temp 98.1°F | Wt 180.4 lb

## 2016-06-18 DIAGNOSIS — R011 Cardiac murmur, unspecified: Secondary | ICD-10-CM | POA: Diagnosis not present

## 2016-06-18 DIAGNOSIS — C060 Malignant neoplasm of cheek mucosa: Secondary | ICD-10-CM

## 2016-06-18 DIAGNOSIS — Z510 Encounter for antineoplastic radiation therapy: Secondary | ICD-10-CM | POA: Diagnosis not present

## 2016-06-18 NOTE — Progress Notes (Addendum)
   Weekly Management Note:  Outpatient    ICD-10-CM   1. Buccal mucosa squamous cell carcinoma (HCC) C06.0     Current Dose: 34 Gy  Projected Dose: 60 Gy   Narrative:  The patient presents for routine under treatment assessment.  CBCT/MVCT images/Port film x-rays were reviewed.  The chart was checked.   Some dizziness with standing, could take in more H20.  Swallowing pills is challenging.  Dry mouth, thick saliva. Lip irritation  Physical Findings:  weight is 180 lb 6.4 oz (81.8 kg). His temperature is 98.1 F (36.7 C). His oxygen saturation is 100%.   Wt Readings from Last 3 Encounters:  06/18/16 180 lb 6.4 oz (81.8 kg)  06/13/16 179 lb 6.4 oz (81.4 kg)  06/11/16 182 lb 6.4 oz (82.7 kg)   Patchy mucositis in the mouth. No oral thrush. Moist desquamation of lateral left lips.  Impression:  The patient is tolerating radiotherapy.  Plan:  Continue radiotherapy as planned. The patient will apply Radiaplex to the skin within the treatment field as directed. Rx lidocaine today for mucosa/lip.  Has pain medication.  Apply tea bag (black) seeping tea over lips to soothe.  Biotene for mouth. Diet ginger ale for thick saliva. Put pill in pudding to help swallowing. Discuss with pharmacist if he may crush pills. Push intake of fluids.  ________________________________   [name], M.D.

## 2016-06-18 NOTE — Progress Notes (Signed)
[name] presents for his 17th fraction of radiation to his Left Buccal/ Left Cheek. He reports discomfort to his throat. He is having difficulty swallowing solids and pills. He is drinking water and about 3 milkshake orally that consist ice cream and boost. He is instilling up to 3 cans of osmolite daily. He is drinking or instilling about 10 ounces of water daily He has thick saliva which causes gaggling. He has tried the baking soda rinse but tells me the salt burned his throat. The skin to his face and neck is red and itches. He is using biafine and benadryl cream with relief. He reports feeling lightheaded at times.   Orthostatics: BP sitting 119/56 pulse 64. BP standing 94/58, pulse 79  Temp 98.1 F (36.7 C)   Wt 180 lb 6.4 oz (81.8 kg)   SpO2 100% Comment: room air  BMI 26.64 kg/m    Wt Readings from Last 3 Encounters:  06/18/16 180 lb 6.4 oz (81.8 kg)  06/13/16 179 lb 6.4 oz (81.4 kg)  06/11/16 182 lb 6.4 oz (82.7 kg)

## 2016-06-19 ENCOUNTER — Ambulatory Visit
Admission: RE | Admit: 2016-06-19 | Discharge: 2016-06-19 | Disposition: A | Discharge status: Home / Self Care | Payer: PPO | Source: Ambulatory Visit | Attending: Radiation Oncology | Admitting: Radiation Oncology

## 2016-06-19 ENCOUNTER — Ambulatory Visit: Payer: PPO | Admitting: Nutrition

## 2016-06-19 DIAGNOSIS — C060 Malignant neoplasm of cheek mucosa: Secondary | ICD-10-CM

## 2016-06-19 DIAGNOSIS — Z510 Encounter for antineoplastic radiation therapy: Secondary | ICD-10-CM | POA: Diagnosis not present

## 2016-06-19 DIAGNOSIS — R011 Cardiac murmur, unspecified: Secondary | ICD-10-CM | POA: Diagnosis not present

## 2016-06-19 MED ORDER — OSMOLITE 1.5 CAL PO LIQD: ORAL | 0 refills | Status: DC

## 2016-06-19 NOTE — Progress Notes (Signed)
Nutrition follow-up completed with patient diagnosed with left buccal mucosa cancer receiving weekly cisplatin adjuvant radiation therapy. Patient status post PEG on May 21. Weight documented as 180.4 pounds June 18 improved from 179.4 pounds June 13. Patient has been tolerating Osmolite 1.5, one bottle 3 times a day with 60 mL free water before and 120 mL free water after bolus feeding. Patient reports he is not eating much by mouth but enjoys his boost plus milkshakes. He is trying to push fluids.  Nutrition diagnosis:  Unintended weight loss has improved. Severe malnutrition continues.  Estimated nutrition needs: 2340-2540 calories, 106-120 grams protein, 2.5 L fluid.  Intervention: Patient was educated to increase Osmolite 1.5-1-1/2 bottles for times a day with 60 mL of free water before and after bolus feeding.  In addition patient will flush feeding tube with 240 cc water 3 times a day between feedings. This provides 2130 cal, 89.4 g protein, 2286 mL free water Recommended patient try to continue oral intake including boost plus milkshakes by mouth. Provided written information.  Questions were answered. Teach back method used. [facility] was notified.  Monitoring, evaluation, goals:  Patient will tolerate tube feeding advancement and oral intake to minimize weight loss.  Next visit: Tuesday, June 26.  **Disclaimer: This note was dictated with voice recognition software. Similar sounding words can inadvertently be transcribed and this note may contain transcription errors which may not have been corrected upon publication of note.**

## 2016-06-20 ENCOUNTER — Ambulatory Visit
Admission: RE | Admit: 2016-06-20 | Discharge: 2016-06-20 | Disposition: A | Discharge status: Home / Self Care | Payer: PPO | Source: Ambulatory Visit | Attending: Radiation Oncology | Admitting: Radiation Oncology

## 2016-06-20 ENCOUNTER — Other Ambulatory Visit (HOSPITAL_BASED_OUTPATIENT_CLINIC_OR_DEPARTMENT_OTHER): Payer: PPO

## 2016-06-20 ENCOUNTER — Telehealth: Payer: Self-pay | Admitting: *Deleted

## 2016-06-20 ENCOUNTER — Telehealth: Payer: Self-pay | Admitting: Internal Medicine

## 2016-06-20 DIAGNOSIS — C060 Malignant neoplasm of cheek mucosa: Secondary | ICD-10-CM

## 2016-06-20 DIAGNOSIS — R011 Cardiac murmur, unspecified: Secondary | ICD-10-CM | POA: Diagnosis not present

## 2016-06-20 DIAGNOSIS — Z510 Encounter for antineoplastic radiation therapy: Secondary | ICD-10-CM | POA: Diagnosis not present

## 2016-06-20 LAB — CBC WITH DIFFERENTIAL/PLATELET
BASO%: 0.3 % (ref 0.0–2.0)
BASOS ABS: 0 10*3/uL (ref 0.0–0.1)
EOS ABS: 0.2 10*3/uL (ref 0.0–0.5)
EOS%: 4.9 % (ref 0.0–7.0)
HCT: 27.8 % — ABNORMAL LOW (ref 38.4–49.9)
HEMOGLOBIN: 9.6 g/dL — AB (ref 13.0–17.1)
LYMPH#: 0.3 10*3/uL — AB (ref 0.9–3.3)
LYMPH%: 9.4 % — ABNORMAL LOW (ref 14.0–49.0)
MCH: 35.6 pg — ABNORMAL HIGH (ref 27.2–33.4)
MCHC: 34.5 g/dL (ref 32.0–36.0)
MCV: 103 fL — AB (ref 79.3–98.0)
MONO#: 0.4 10*3/uL (ref 0.1–0.9)
MONO%: 11.9 % (ref 0.0–14.0)
NEUT#: 2.4 10*3/uL (ref 1.5–6.5)
NEUT%: 73.5 % (ref 39.0–75.0)
PLATELETS: 87 10*3/uL — AB (ref 140–400)
RBC: 2.7 10*6/uL — ABNORMAL LOW (ref 4.20–5.82)
RDW: 13.7 % (ref 11.0–14.6)
WBC: 3.3 10*3/uL — ABNORMAL LOW (ref 4.0–10.3)
nRBC: 0 % (ref 0–0)

## 2016-06-20 LAB — COMPREHENSIVE METABOLIC PANEL
ALT: 10 U/L (ref 0–55)
ANION GAP: 8 meq/L (ref 3–11)
AST: 11 U/L (ref 5–34)
Albumin: 3.3 g/dL — ABNORMAL LOW (ref 3.5–5.0)
Alkaline Phosphatase: 56 U/L (ref 40–150)
BUN: 24.1 mg/dL (ref 7.0–26.0)
CHLORIDE: 97 meq/L — AB (ref 98–109)
CO2: 31 mEq/L — ABNORMAL HIGH (ref 22–29)
Calcium: 9.2 mg/dL (ref 8.4–10.4)
Creatinine: 1 mg/dL (ref 0.7–1.3)
EGFR: 76 mL/min/{1.73_m2} — AB (ref >90)
Glucose: 242 mg/dl — ABNORMAL HIGH (ref 70–140)
Potassium: 5.8 mEq/L — ABNORMAL HIGH (ref 3.5–5.1)
Sodium: 136 mEq/L (ref 136–145)
Total Bilirubin: 0.45 mg/dL (ref 0.20–1.20)
Total Protein: 6.4 g/dL (ref 6.4–8.3)

## 2016-06-20 LAB — MAGNESIUM: Magnesium: 1.8 mg/dl (ref 1.5–2.5)

## 2016-06-20 MED ORDER — BALSALAZIDE DISODIUM 750 MG PO CAPS: 2250.0000 mg | ORAL_CAPSULE | Freq: Three times a day (TID) | ORAL | 3 refills | Status: DC

## 2016-06-20 MED ORDER — SODIUM POLYSTYRENE SULFONATE 15 GM/60ML PO SUSP: 15.0000 g | Freq: Once | ORAL | 0 refills | Status: AC

## 2016-06-20 NOTE — Telephone Encounter (Signed)
Pt notified of message below.

## 2016-06-20 NOTE — Telephone Encounter (Signed)
Pt has been receiving chemo and radiation for head and neck cancer. He has started having issues swallowing pills. Requesting an alternative for his Lialda as he is having difficulty swallowing it. Wife states he does have a feeding tube. Please advise.

## 2016-06-20 NOTE — Telephone Encounter (Signed)
-----   Message from [name], MD sent at 06/20/2016  3:55 PM EDT ----- Regarding: call patient: re high potassium Tell him he needs to take kayexalate tonight Call in 15 g PO X 1 ----- Message ----- From: Interface, Lab In Three Zero One Sent: 06/20/2016   2:43 PM To: [name], MD

## 2016-06-20 NOTE — Telephone Encounter (Signed)
Can try a non-extended release 5-asa Balsalazide is an option, these can be open and sprinkled on applesauce (could likely be placed in water and given VT also) 2.25 g TID

## 2016-06-20 NOTE — Telephone Encounter (Signed)
Spoke with pts wife and she is aware. Script sent to pharmacy. 

## 2016-06-21 ENCOUNTER — Ambulatory Visit
Admission: RE | Admit: 2016-06-21 | Discharge: 2016-06-21 | Disposition: A | Discharge status: Home / Self Care | Payer: PPO | Source: Ambulatory Visit | Attending: Radiation Oncology | Admitting: Radiation Oncology

## 2016-06-21 ENCOUNTER — Encounter: Payer: Self-pay | Admitting: Hematology and Oncology

## 2016-06-21 ENCOUNTER — Ambulatory Visit (HOSPITAL_BASED_OUTPATIENT_CLINIC_OR_DEPARTMENT_OTHER): Payer: PPO | Admitting: Hematology and Oncology

## 2016-06-21 DIAGNOSIS — E875 Hyperkalemia: Secondary | ICD-10-CM

## 2016-06-21 DIAGNOSIS — C069 Malignant neoplasm of mouth, unspecified: Secondary | ICD-10-CM | POA: Diagnosis not present

## 2016-06-21 DIAGNOSIS — C060 Malignant neoplasm of cheek mucosa: Secondary | ICD-10-CM | POA: Diagnosis not present

## 2016-06-21 DIAGNOSIS — K1231 Oral mucositis (ulcerative) due to antineoplastic therapy: Secondary | ICD-10-CM | POA: Diagnosis not present

## 2016-06-21 DIAGNOSIS — I69991 Dysphagia following unspecified cerebrovascular disease: Secondary | ICD-10-CM | POA: Diagnosis not present

## 2016-06-21 DIAGNOSIS — R011 Cardiac murmur, unspecified: Secondary | ICD-10-CM | POA: Diagnosis not present

## 2016-06-21 DIAGNOSIS — D61818 Other pancytopenia: Secondary | ICD-10-CM | POA: Diagnosis not present

## 2016-06-21 DIAGNOSIS — Z510 Encounter for antineoplastic radiation therapy: Secondary | ICD-10-CM | POA: Diagnosis not present

## 2016-06-21 NOTE — Assessment & Plan Note (Signed)
He had recent hyperkalemia for some unknown reason I have ordered several doses of Kayexalate for him to take every other day for 3-4 doses I will hold treatment tomorrow and recheck his potassium level next week

## 2016-06-21 NOTE — Assessment & Plan Note (Signed)
This is due to side effects of treatment I will hold treatment tomorrow He is not symptomatic We will observe only

## 2016-06-21 NOTE — Assessment & Plan Note (Signed)
I have a long discussion with the patient and his wife regarding chronic pain management. I recommend he start using pain medicine as prescribed regularly.  I will reassess pain control next week

## 2016-06-21 NOTE — Progress Notes (Signed)
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 Tumor measures 0.9 cm in greatest dimension. Tumor extends to the inked deep tissue edge. Depth of invasion: 4 mm (in this material) See note.  Note: an immunostains for P16 was performed at an outside hospital and provided for [place] to review; p16 immunostain is negative. Per outside report, In situ hybridization for high-risk HPV types showed no evidence of transcriptionally active high-risk HPV types.      03/21/2016 Initial Diagnosis    Salient findings:  -EAC's clear -Lesion within left buccal mucosa (pictured below) with about 60m depth. Width is about 2/3 of the left buccal mucosal surface area by palpation. Extends to the oral commissure.  -Tongue soft and mobile without mucosal lesions -Neck soft with no palpable neck masses -Right handed -Right hand is pink and warm with Allen's test negative (normal)  -Left forearm very sun damaged with multiple treated areas from precancerous or cancerous areas. Right forearm not as dramatic, presumably from sun exposure while driving      [id number]/1194Surgery    RESECTION OF ORAL BUCCAL MUCOSA SELECTIVE NECK DISSECTION FREE FLAP RADIAL FOREARM PR SPLIT GInkster<100 SQCM [15100] (SPLIT THICKNESS SKIN GRAFT LEG         04/13/2016 Pathology Results    A.LEFT ORAL COMMISSURE INFERIOR, EXCISION: No malignancy identified.  B.LEFT ORAL COMMISSURE SUPERIOR, EXCISION: No malignancy identified.  C.LEFT DEEP ORAL COMMISSURE, EXCISION: No malignancy identified.  D.RIGHT POSTERIOR BUCCAL INFERIOR, EXCISION: No malignancy identified.  E.RIGHT POSTERIOR BUCCAL SUPERIOR, EXCISION: No malignancy identified.  F.ORAL CAVITY, LEFT BUCCAL, EXCISION:  Invasive squamous cell carcinoma, moderately differentiated,  keratinizing. Tumor size:2.2 cm. Tumor depth of invasion:0.8 cm. Perineural invasion identified. Margins negative for malignancy. Pathologic stage:pT2 pN2a. See tumor protocol summary below.  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 Tumor deposit size:1.1 cm. Extranodal extension present. Benign salivary gland tissue.  SURGICAL PATHOLOGY CANCER CASE SUMMARY (AJCC 8TH EDITION) LIP AND ORAL CAVITY CAP Protocol posting date: June, 2017 Version: LipOralCavity 4.0.0.0  PROCEDURE: Wide resection of buccal mucosa TUMOR SITE:Oral:Buccal mucosa TUMOR LATERALITY: Left TUMOR FOCALITY: Unifocal TUMOR SIZE:Greatest dimension: 2.2 cm TUMOR DEPTH OF INVASION (DOI):8 mm HISTOLOGIC TYPE:Squamous cell carcinoma, conventional HISTOLOGIC GRADE:G2: Moderately differentiated SPECIMEN MARGINS:Uninvolved by invasive carcinoma  DISTANCE FROM CLOSEST MARGIN:1 mm  SPECIFY MARGIN(S):Deep LYMPHOVASCULAR INVASION:Present PERINEURAL INVASION: Present REGIONAL LYMPH NODES: NUMBER OF LYMPH NODES INVOLVED: 1 NUMBER OF LYMPH NODES EXAMINED: 4 LATERALITY OF LYMPH NODES INVOLVED: Ipsilateral SIZE OF LARGEST METASTATIC DEPOSIT:1.1 cm EXTRANODAL EXTENSION:Present PATHOLOGIC STAGE CLASSIFICATION (pTNM, AJCC 8th  Ed): pT2 pN2a pT2: umor >2 cm but <=4 cm and <=10 mm DOI pN2a:Metastasis in a single ipsilateral lymph node, 3 cm or  smaller in greatest dimension and ENE(+) BIOMARKERS:performed on prior outside biopsy ([facility] case [id number] outside case [id number] P16 (Immunohistochemistry):Negative HPV (high-risk types by in situ hybridization):Reported as negative      05/11/2016 Imaging    1.  4 mm left lower lobe pulmonary nodule. Attention on follow-up imaging recommended as metastatic disease not excluded. 2. 2.8 cm left thyroid nodule. Thyroid ultrasound recommended to further evaluate. 3. Bilateral renal cysts. 4. Coronary artery and thoracoabdominal aortic atherosclerosis. 5. Ankylosing spondylitis.        05/11/2016 Imaging    CT neck: Surgical clips in the region of the left buccal mucosa, presumably at the previous primary site. Mild scarring in that region measuring about 8 mm. Cannot assess for residual or recurrent disease in that location, but this may simply be scarring. Previous left submandibular resection and left neck node dissection. No abnormal nodes presently. Multinodular goiter. Ankylosing spondylitis.  C1-2 remains a mobile segment.      05/21/2016 Procedure    1. Successful placement of a right internal jugular approach power injectable Port-A-Cath. The Port a catheter is ready for immediate use. 2. Successful fluoroscopic insertion of a 20-French pull-through gastrostomy tube. The gastrostomy may be used immediately for medication administration and may be utilized in 24 hrs for the initiation of feeds.      05/23/2016 -  Radiation Therapy    He received radiation treatment       05/25/2016 -  Chemotherapy    He received weekly reduced dose cisplatin       06/21/2016 Adverse Reaction    Cycle 5 is delayed due to pancytopenia       INTERVAL HISTORY: Please see below for problem oriented charting. He returns with his wife for further  follow-up He complained of some mucositis in the buccal mucosa area He takes pain medicine as needed He denies constipation No recent nausea He is able to tolerate nutritional supplement and has not lost any weight He has not taken the Kayexalate as ordered yet  REVIEW OF SYSTEMS:   Constitutional: Denies fevers, chills or abnormal weight loss Eyes: Denies blurriness of vision Respiratory: Denies cough, dyspnea or wheezes Cardiovascular: Denies palpitation, chest discomfort or lower extremity swelling Gastrointestinal:  Denies nausea, heartburn or change in bowel habits Skin: Denies abnormal skin rashes Lymphatics: Denies new lymphadenopathy or easy bruising Neurological:Denies numbness, tingling or new weaknesses Behavioral/Psych: Mood is stable, no new changes  All other systems were reviewed with the patient and are negative.  I have reviewed the past medical history, past surgical history, social history and family history with the patient and they are unchanged from previous note.  ALLERGIES:  has No Known Allergies.  MEDICATIONS:  Current Outpatient Prescriptions  Medication Sig Dispense Refill  . aspirin EC 81 MG tablet Take 81 mg by mouth daily.    Marland Kitchen atorvastatin (LIPITOR) 80 MG tablet Take 80 mg by mouth at bedtime.    . balsalazide (COLAZAL) 750 MG capsule Take 3 capsules (2,250 mg total) by mouth 3 (three) times daily. 270 capsule 3  . cyanocobalamin (,VITAMIN B-12,) 1000 MCG/ML injection INJECT 1ML ONCE MONTHLY. 3 mL 1  . isosorbide mononitrate (IMDUR) 60 MG 24 hr tablet Take 90 mg by mouth daily.    Marland Kitchen lidocaine (XYLOCAINE) 2 % solution Patient: Mix 1part 2% viscous lidocaine, 1part H20. Swish & swallow 28mL of diluted mixture, 42min before meals and at bedtime, up to QID 100 mL 5  . lidocaine-prilocaine (EMLA) cream Apply to affected area once 30 g 3  . mercaptopurine (PURINETHOL) 50 MG tablet TAKE ONE TABLET ONCE A DAY ON AN EMPTY STOMACH 1 HR BEFORE OR 2HRS AFTER A  MEAL 30 tablet 2  . mesalamine (LIALDA) 1.2 g EC  tablet Take 2.4 g by mouth daily with breakfast.    . metFORMIN (GLUCOPHAGE) 500 MG tablet Take 1 tablet (500 mg total) by mouth 2 (two) times daily with a meal. Must Keep 05/16/15 appt for future refills 180 tablet 0  . Nutritional Supplements (FEEDING SUPPLEMENT, OSMOLITE 1.5 CAL,) LIQD Give 1 and 1/2 cans Osmolite 1.5 via PEG 4 times daily with 60 mL free water before and after bolus feedings. In addition, drink or flush PEG with 240 mL free water TID. Send formula and supplies. 6 Bottle 0  . nystatin (MYCOSTATIN) 100000 UNIT/ML suspension Take 5 mLs (500,000 Units total) by mouth 4 (four) times daily. 200 mL 0  . fentaNYL (DURAGESIC - DOSED MCG/HR) 25 MCG/HR patch Place 1 patch (25 mcg total) onto the skin every 3 (three) days. (Patient not taking: Reported on 06/11/2016) 5 patch 0  . Morphine Sulfate (MORPHINE CONCENTRATE) 10 mg / 0.5 ml concentrated solution Place 0.5 mLs (10 mg total) into feeding tube every 2 (two) hours as needed for severe pain. (Patient not taking: Reported on 06/11/2016) 120 mL 0  . nitroGLYCERIN (NITROSTAT) 0.4 MG SL tablet Place 0.4 mg under the tongue every 5 (five) minutes as needed for chest pain.    Marland Kitchen ondansetron (ZOFRAN) 8 MG tablet Take 1 tablet (8 mg total) by mouth 2 (two) times daily as needed. Start on the third day after chemotherapy. (Patient not taking: Reported on 06/13/2016) 30 tablet 1  . oxyCODONE (OXY IR/ROXICODONE) 5 MG immediate release tablet Take 5 mg by mouth every 6 (six) hours as needed for severe pain.    Marland Kitchen prochlorperazine (COMPAZINE) 10 MG tablet Take 1 tablet (10 mg total) by mouth every 6 (six) hours as needed (Nausea or vomiting). (Patient not taking: Reported on 06/13/2016) 30 tablet 1   No current facility-administered medications for this visit.     PHYSICAL EXAMINATION: ECOG PERFORMANCE STATUS: 1 - Symptomatic but completely ambulatory  Vitals:   06/21/16 1355  BP: (!) 109/56  Pulse: 78   Resp: 18  Temp: 98.4 F (36.9 C)   Filed Weights   06/21/16 1355  Weight: 181 lb 6.4 oz (82.3 kg)    GENERAL:alert, no distress and comfortable SKIN: Noted evidence of erythema near his mouth at the radiation site EYES: normal, Conjunctiva are pink and non-injected, sclera clear OROPHARYNX: Noted mucositis at the buccal mucosa.  No thrush NECK: supple, thyroid normal size, non-tender, without nodularity LYMPH:  no palpable lymphadenopathy in the cervical, axillary or inguinal LUNGS: clear to auscultation and percussion with normal breathing effort HEART: regular rate & rhythm and no murmurs and no lower extremity edema ABDOMEN:abdomen soft, non-tender and normal bowel sounds Musculoskeletal:no cyanosis of digits and no clubbing  NEURO: alert & oriented x 3 with fluent speech, no focal motor/sensory deficits  LABORATORY DATA:  I have reviewed the data as listed    Component Value Date/Time   NA 136 06/20/2016 1427   K 5.8 (H) 06/20/2016 1427   CL 102 05/22/2016 1020   CO2 31 (H) 06/20/2016 1427   GLUCOSE 242 (H) 06/20/2016 1427   GLUCOSE 98 12/12/2005 0729   BUN 24.1 06/20/2016 1427   CREATININE 1.0 06/20/2016 1427   CALCIUM 9.2 06/20/2016 1427   PROT 6.4 06/20/2016 1427   ALBUMIN 3.3 (L) 06/20/2016 1427   AST 11 06/20/2016 1427   ALT 10 06/20/2016 1427   ALKPHOS 56 06/20/2016 1427   BILITOT 0.45 06/20/2016 1427   GFRNONAA >60 05/22/2016 1020  GFRAA >60 05/22/2016 1020    No results found for: SPEP, UPEP  Lab Results  Component Value Date   WBC 3.3 (L) 06/20/2016   NEUTROABS 2.4 06/20/2016   HGB 9.6 (L) 06/20/2016   HCT 27.8 (L) 06/20/2016   MCV 103.0 (H) 06/20/2016   PLT 87 (L) 06/20/2016      Chemistry      Component Value Date/Time   NA 136 06/20/2016 1427   K 5.8 (H) 06/20/2016 1427   CL 102 05/22/2016 1020   CO2 31 (H) 06/20/2016 1427   BUN 24.1 06/20/2016 1427   CREATININE 1.0 06/20/2016 1427      Component Value Date/Time   CALCIUM 9.2  06/20/2016 1427   ALKPHOS 56 06/20/2016 1427   AST 11 06/20/2016 1427   ALT 10 06/20/2016 1427   BILITOT 0.45 06/20/2016 1427      ASSESSMENT & PLAN:  Buccal mucosa squamous cell carcinoma ([facility]) He tolerated chemotherapy well with expected side effects Recent blood work show significant pancytopenia and hyperkalemia I will do not recommend we proceed with treatment next week and reschedule to the following week after repeat blood work  Acute hyperkalemia He had recent hyperkalemia for some unknown reason I have ordered several doses of Kayexalate for him to take every other day for 3-4 doses I will hold treatment tomorrow and recheck his potassium level next week  Acquired pancytopenia ([place]) This is due to side effects of treatment I will hold treatment tomorrow He is not symptomatic We will observe only  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 I recommend he start using pain medicine as prescribed regularly.  I will reassess pain control next week   No orders of the defined types were placed in this encounter.  All questions were answered. The patient knows to call the clinic with any problems, questions or concerns. No barriers to learning was detected. I spent 20 minutes counseling the patient face to face. The total time spent in the appointment was 25 minutes and more than 50% was on counseling and review of test results     [name], MD 06/21/2016 4:14 PM

## 2016-06-21 NOTE — Assessment & Plan Note (Signed)
He tolerated chemotherapy well with expected side effects Recent blood work show significant pancytopenia and hyperkalemia I will do not recommend we proceed with treatment next week and reschedule to the following week after repeat blood work

## 2016-06-22 ENCOUNTER — Ambulatory Visit
Admission: RE | Admit: 2016-06-22 | Discharge: 2016-06-22 | Disposition: A | Discharge status: Home / Self Care | Payer: PPO | Source: Ambulatory Visit | Attending: Radiation Oncology | Admitting: Radiation Oncology

## 2016-06-22 ENCOUNTER — Ambulatory Visit: Payer: PPO

## 2016-06-22 DIAGNOSIS — C060 Malignant neoplasm of cheek mucosa: Secondary | ICD-10-CM | POA: Diagnosis not present

## 2016-06-22 DIAGNOSIS — Z510 Encounter for antineoplastic radiation therapy: Secondary | ICD-10-CM | POA: Diagnosis not present

## 2016-06-22 DIAGNOSIS — R011 Cardiac murmur, unspecified: Secondary | ICD-10-CM | POA: Diagnosis not present

## 2016-06-22 MED ORDER — BIAFINE EX EMUL
CUTANEOUS | Status: DC | PRN
  Administered 2016-06-22: 16:00:00 via TOPICAL

## 2016-06-25 ENCOUNTER — Ambulatory Visit
Admission: RE | Admit: 2016-06-25 | Discharge: 2016-06-25 | Disposition: A | Discharge status: Home / Self Care | Payer: PPO | Source: Ambulatory Visit | Attending: Radiation Oncology | Admitting: Radiation Oncology

## 2016-06-25 DIAGNOSIS — Z510 Encounter for antineoplastic radiation therapy: Secondary | ICD-10-CM | POA: Diagnosis not present

## 2016-06-25 DIAGNOSIS — R011 Cardiac murmur, unspecified: Secondary | ICD-10-CM | POA: Diagnosis not present

## 2016-06-25 DIAGNOSIS — C060 Malignant neoplasm of cheek mucosa: Secondary | ICD-10-CM | POA: Diagnosis not present

## 2016-06-26 ENCOUNTER — Emergency Department (HOSPITAL_COMMUNITY): Payer: PPO

## 2016-06-26 ENCOUNTER — Ambulatory Visit (HOSPITAL_BASED_OUTPATIENT_CLINIC_OR_DEPARTMENT_OTHER): Payer: PPO

## 2016-06-26 ENCOUNTER — Ambulatory Visit
Admission: RE | Admit: 2016-06-26 | Discharge: 2016-06-26 | Disposition: A | Discharge status: Home / Self Care | Payer: PPO | Source: Ambulatory Visit | Attending: Radiation Oncology | Admitting: Radiation Oncology

## 2016-06-26 ENCOUNTER — Inpatient Hospital Stay (HOSPITAL_COMMUNITY)
Admission: EM | Admit: 2016-06-26 | Discharge: 2016-06-29 | DRG: 808 | Disposition: A | Discharge status: Home / Self Care | Payer: PPO | Attending: Internal Medicine | Admitting: Internal Medicine

## 2016-06-26 ENCOUNTER — Telehealth: Payer: Self-pay | Admitting: *Deleted

## 2016-06-26 ENCOUNTER — Encounter (HOSPITAL_COMMUNITY): Payer: Self-pay | Admitting: Emergency Medicine

## 2016-06-26 ENCOUNTER — Encounter: Payer: Self-pay | Admitting: Nutrition

## 2016-06-26 ENCOUNTER — Encounter: Payer: Self-pay | Admitting: Internal Medicine

## 2016-06-26 ENCOUNTER — Ambulatory Visit (INDEPENDENT_AMBULATORY_CARE_PROVIDER_SITE_OTHER): Payer: PPO | Admitting: Internal Medicine

## 2016-06-26 ENCOUNTER — Encounter: Payer: PPO | Admitting: Nutrition

## 2016-06-26 VITALS — BP 110/48 | HR 86 | Temp 101.2°F | Resp 20 | Ht 69.0 in | Wt 184.2 lb

## 2016-06-26 DIAGNOSIS — D709 Neutropenia, unspecified: Secondary | ICD-10-CM | POA: Diagnosis not present

## 2016-06-26 DIAGNOSIS — R001 Bradycardia, unspecified: Secondary | ICD-10-CM

## 2016-06-26 DIAGNOSIS — I10 Essential (primary) hypertension: Secondary | ICD-10-CM | POA: Diagnosis not present

## 2016-06-26 DIAGNOSIS — D61810 Antineoplastic chemotherapy induced pancytopenia: Secondary | ICD-10-CM | POA: Diagnosis present

## 2016-06-26 DIAGNOSIS — Z931 Gastrostomy status: Secondary | ICD-10-CM

## 2016-06-26 DIAGNOSIS — Z510 Encounter for antineoplastic radiation therapy: Secondary | ICD-10-CM | POA: Diagnosis not present

## 2016-06-26 DIAGNOSIS — E118 Type 2 diabetes mellitus with unspecified complications: Secondary | ICD-10-CM | POA: Diagnosis present

## 2016-06-26 DIAGNOSIS — E46 Unspecified protein-calorie malnutrition: Secondary | ICD-10-CM | POA: Diagnosis not present

## 2016-06-26 DIAGNOSIS — Z6826 Body mass index (BMI) 26.0-26.9, adult: Secondary | ICD-10-CM

## 2016-06-26 DIAGNOSIS — K5190 Ulcerative colitis, unspecified, without complications: Secondary | ICD-10-CM | POA: Diagnosis present

## 2016-06-26 DIAGNOSIS — E785 Hyperlipidemia, unspecified: Secondary | ICD-10-CM | POA: Diagnosis not present

## 2016-06-26 DIAGNOSIS — R509 Fever, unspecified: Secondary | ICD-10-CM | POA: Diagnosis not present

## 2016-06-26 DIAGNOSIS — I2510 Atherosclerotic heart disease of native coronary artery without angina pectoris: Secondary | ICD-10-CM | POA: Diagnosis not present

## 2016-06-26 DIAGNOSIS — Z87891 Personal history of nicotine dependence: Secondary | ICD-10-CM

## 2016-06-26 DIAGNOSIS — C060 Malignant neoplasm of cheek mucosa: Secondary | ICD-10-CM | POA: Diagnosis not present

## 2016-06-26 DIAGNOSIS — K219 Gastro-esophageal reflux disease without esophagitis: Secondary | ICD-10-CM | POA: Diagnosis not present

## 2016-06-26 DIAGNOSIS — Z66 Do not resuscitate: Secondary | ICD-10-CM | POA: Diagnosis not present

## 2016-06-26 DIAGNOSIS — R5081 Fever presenting with conditions classified elsewhere: Secondary | ICD-10-CM | POA: Diagnosis present

## 2016-06-26 DIAGNOSIS — Z85828 Personal history of other malignant neoplasm of skin: Secondary | ICD-10-CM

## 2016-06-26 DIAGNOSIS — D61818 Other pancytopenia: Secondary | ICD-10-CM | POA: Diagnosis not present

## 2016-06-26 DIAGNOSIS — Z7982 Long term (current) use of aspirin: Secondary | ICD-10-CM

## 2016-06-26 DIAGNOSIS — E119 Type 2 diabetes mellitus without complications: Secondary | ICD-10-CM | POA: Diagnosis present

## 2016-06-26 DIAGNOSIS — R918 Other nonspecific abnormal finding of lung field: Secondary | ICD-10-CM | POA: Diagnosis not present

## 2016-06-26 DIAGNOSIS — K5010 Crohn's disease of large intestine without complications: Secondary | ICD-10-CM | POA: Diagnosis present

## 2016-06-26 DIAGNOSIS — K1230 Oral mucositis (ulcerative), unspecified: Secondary | ICD-10-CM | POA: Diagnosis not present

## 2016-06-26 DIAGNOSIS — I639 Cerebral infarction, unspecified: Secondary | ICD-10-CM | POA: Diagnosis not present

## 2016-06-26 DIAGNOSIS — Z951 Presence of aortocoronary bypass graft: Secondary | ICD-10-CM

## 2016-06-26 DIAGNOSIS — E43 Unspecified severe protein-calorie malnutrition: Secondary | ICD-10-CM | POA: Insufficient documentation

## 2016-06-26 DIAGNOSIS — G8929 Other chronic pain: Secondary | ICD-10-CM | POA: Diagnosis not present

## 2016-06-26 DIAGNOSIS — R55 Syncope and collapse: Secondary | ICD-10-CM

## 2016-06-26 DIAGNOSIS — L98499 Non-pressure chronic ulcer of skin of other sites with unspecified severity: Secondary | ICD-10-CM | POA: Diagnosis present

## 2016-06-26 DIAGNOSIS — Y842 Radiological procedure and radiotherapy as the cause of abnormal reaction of the patient, or of later complication, without mention of misadventure at the time of the procedure: Secondary | ICD-10-CM | POA: Diagnosis not present

## 2016-06-26 DIAGNOSIS — D630 Anemia in neoplastic disease: Secondary | ICD-10-CM

## 2016-06-26 DIAGNOSIS — Z79899 Other long term (current) drug therapy: Secondary | ICD-10-CM | POA: Diagnosis not present

## 2016-06-26 DIAGNOSIS — L598 Other specified disorders of the skin and subcutaneous tissue related to radiation: Secondary | ICD-10-CM | POA: Diagnosis present

## 2016-06-26 DIAGNOSIS — K1231 Oral mucositis (ulcerative) due to antineoplastic therapy: Secondary | ICD-10-CM | POA: Diagnosis not present

## 2016-06-26 DIAGNOSIS — D519 Vitamin B12 deficiency anemia, unspecified: Secondary | ICD-10-CM | POA: Diagnosis not present

## 2016-06-26 DIAGNOSIS — T451X5A Adverse effect of antineoplastic and immunosuppressive drugs, initial encounter: Secondary | ICD-10-CM | POA: Diagnosis present

## 2016-06-26 DIAGNOSIS — Z7984 Long term (current) use of oral hypoglycemic drugs: Secondary | ICD-10-CM | POA: Diagnosis not present

## 2016-06-26 LAB — CBC WITH DIFFERENTIAL/PLATELET
BASOS ABS: 0 10*3/uL (ref 0.0–0.1)
BASOS PCT: 1 %
EOS ABS: 0.1 10*3/uL (ref 0.0–0.7)
Eosinophils Relative: 6 %
HCT: 24.3 % — ABNORMAL LOW (ref 39.0–52.0)
Hemoglobin: 8.5 g/dL — ABNORMAL LOW (ref 13.0–17.0)
LYMPHS PCT: 17 %
Lymphs Abs: 0.3 10*3/uL — ABNORMAL LOW (ref 0.7–4.0)
MCH: 34.8 pg — AB (ref 26.0–34.0)
MCHC: 35 g/dL (ref 30.0–36.0)
MCV: 99.6 fL (ref 78.0–100.0)
MONO ABS: 0.5 10*3/uL (ref 0.1–1.0)
Monocytes Relative: 25 %
NEUTROS PCT: 51 %
Neutro Abs: 1 10*3/uL — ABNORMAL LOW (ref 1.7–7.7)
PLATELETS: 138 10*3/uL — AB (ref 150–400)
RBC: 2.44 MIL/uL — ABNORMAL LOW (ref 4.22–5.81)
RDW: 13.9 % (ref 11.5–15.5)
WBC: 1.9 10*3/uL — ABNORMAL LOW (ref 4.0–10.5)

## 2016-06-26 LAB — COMPREHENSIVE METABOLIC PANEL
ALK PHOS: 46 U/L (ref 38–126)
ALT: 11 U/L — AB (ref 17–63)
ANION GAP: 9 (ref 5–15)
AST: 17 U/L (ref 15–41)
Albumin: 3.2 g/dL — ABNORMAL LOW (ref 3.5–5.0)
BILIRUBIN TOTAL: 0.2 mg/dL — AB (ref 0.3–1.2)
BUN: 29 mg/dL — ABNORMAL HIGH (ref 6–20)
CALCIUM: 8.5 mg/dL — AB (ref 8.9–10.3)
CO2: 26 mmol/L (ref 22–32)
Chloride: 97 mmol/L — ABNORMAL LOW (ref 101–111)
Creatinine, Ser: 0.85 mg/dL (ref 0.61–1.24)
Glucose, Bld: 186 mg/dL — ABNORMAL HIGH (ref 65–99)
Potassium: 4.4 mmol/L (ref 3.5–5.1)
SODIUM: 132 mmol/L — AB (ref 135–145)
TOTAL PROTEIN: 6.4 g/dL — AB (ref 6.5–8.1)

## 2016-06-26 LAB — URINALYSIS, ROUTINE W REFLEX MICROSCOPIC
BILIRUBIN URINE: NEGATIVE
HGB URINE DIPSTICK: NEGATIVE
Ketones, ur: NEGATIVE mg/dL
Leukocytes, UA: NEGATIVE
NITRITE: NEGATIVE
Protein, ur: 30 mg/dL — AB
SPECIFIC GRAVITY, URINE: 1.015 (ref 1.005–1.030)
Squamous Epithelial / LPF: NONE SEEN
pH: 7 (ref 5.0–8.0)

## 2016-06-26 LAB — I-STAT CG4 LACTIC ACID, ED: LACTIC ACID, VENOUS: 0.72 mmol/L (ref 0.5–1.9)

## 2016-06-26 MED ORDER — ONDANSETRON HCL 4 MG PO TABS
4.0000 mg | ORAL_TABLET | Freq: Four times a day (QID) | ORAL | Status: DC | PRN
Start: 2016-06-26 — End: 2016-06-29

## 2016-06-26 MED ORDER — OXYCODONE HCL 5 MG PO TABS
5.0000 mg | ORAL_TABLET | Freq: Four times a day (QID) | ORAL | Status: DC | PRN
Start: 2016-06-26 — End: 2016-06-27

## 2016-06-26 MED ORDER — ASPIRIN EC 81 MG PO TBEC
81.0000 mg | DELAYED_RELEASE_TABLET | Freq: Every day | ORAL | Status: DC
  Administered 2016-06-27: 81 mg via ORAL
  Filled 2016-06-26 (×2): qty 1

## 2016-06-26 MED ORDER — NITROGLYCERIN 0.4 MG SL SUBL: 0.4000 mg | SUBLINGUAL_TABLET | SUBLINGUAL | Status: DC | PRN

## 2016-06-26 MED ORDER — BALSALAZIDE DISODIUM 750 MG PO CAPS
2250.0000 mg | ORAL_CAPSULE | Freq: Three times a day (TID) | ORAL | Status: DC
  Administered 2016-06-27 (×2): 2250 mg via ORAL
  Filled 2016-06-26 (×8): qty 3

## 2016-06-26 MED ORDER — PROCHLORPERAZINE MALEATE 10 MG PO TABS
10.0000 mg | ORAL_TABLET | Freq: Four times a day (QID) | ORAL | Status: DC | PRN
  Filled 2016-06-26: qty 1

## 2016-06-26 MED ORDER — MORPHINE SULFATE (CONCENTRATE) 10 MG /0.5 ML PO SOLN
10.0000 mg | ORAL | Status: DC | PRN
  Filled 2016-06-26: qty 0.5

## 2016-06-26 MED ORDER — ATORVASTATIN CALCIUM 40 MG PO TABS
80.0000 mg | ORAL_TABLET | Freq: Every day | ORAL | Status: DC
  Administered 2016-06-27 (×2): 80 mg via ORAL
  Filled 2016-06-26: qty 1
  Filled 2016-06-26: qty 2

## 2016-06-26 MED ORDER — ACETAMINOPHEN 650 MG RE SUPP: 650.0000 mg | Freq: Four times a day (QID) | RECTAL | Status: DC | PRN

## 2016-06-26 MED ORDER — POLYETHYLENE GLYCOL 3350 17 G PO PACK
17.0000 g | PACK | Freq: Every day | ORAL | Status: DC
  Administered 2016-06-27 – 2016-06-29 (×3): 17 g
  Filled 2016-06-26 (×3): qty 1

## 2016-06-26 MED ORDER — VANCOMYCIN HCL IN DEXTROSE 1-5 GM/200ML-% IV SOLN
1000.0000 mg | Freq: Two times a day (BID) | INTRAVENOUS | Status: AC
  Administered 2016-06-27 – 2016-06-28 (×4): 1000 mg via INTRAVENOUS
  Filled 2016-06-26 (×4): qty 200

## 2016-06-26 MED ORDER — PIPERACILLIN-TAZOBACTAM 3.375 G IVPB
3.3750 g | Freq: Three times a day (TID) | INTRAVENOUS | Status: DC
  Administered 2016-06-27 – 2016-06-29 (×7): 3.375 g via INTRAVENOUS
  Filled 2016-06-26 (×7): qty 50

## 2016-06-26 MED ORDER — ENOXAPARIN SODIUM 40 MG/0.4ML ~~LOC~~ SOLN
40.0000 mg | SUBCUTANEOUS | Status: DC
  Administered 2016-06-27 – 2016-06-28 (×3): 40 mg via SUBCUTANEOUS
  Filled 2016-06-26 (×3): qty 0.4

## 2016-06-26 MED ORDER — PERFLUTREN LIPID MICROSPHERE
1.0000 mL | INTRAVENOUS | Status: AC | PRN
  Administered 2016-06-26: 3 mL via INTRAVENOUS

## 2016-06-26 MED ORDER — ONDANSETRON HCL 4 MG PO TABS: 8.0000 mg | ORAL_TABLET | Freq: Two times a day (BID) | ORAL | Status: DC | PRN

## 2016-06-26 MED ORDER — PIPERACILLIN-TAZOBACTAM 3.375 G IVPB 30 MIN
3.3750 g | Freq: Once | INTRAVENOUS | Status: AC
  Administered 2016-06-26: 3.375 g via INTRAVENOUS
  Filled 2016-06-26: qty 50

## 2016-06-26 MED ORDER — ONDANSETRON HCL 4 MG/2ML IJ SOLN: 4.0000 mg | Freq: Four times a day (QID) | INTRAMUSCULAR | Status: DC | PRN

## 2016-06-26 MED ORDER — VANCOMYCIN HCL 10 G IV SOLR
1500.0000 mg | Freq: Once | INTRAVENOUS | Status: AC
  Administered 2016-06-26: 1500 mg via INTRAVENOUS
  Filled 2016-06-26: qty 1500

## 2016-06-26 MED ORDER — PERFLUTREN LIPID MICROSPHERE: 1.0000 mL | INTRAVENOUS | Status: AC | PRN

## 2016-06-26 MED ORDER — OSMOLITE 1.5 CAL PO LIQD
355.0000 mL | Freq: Four times a day (QID) | ORAL | Status: DC
  Administered 2016-06-27: 355 mL
  Filled 2016-06-26 (×3): qty 474

## 2016-06-26 MED ORDER — FENTANYL 25 MCG/HR TD PT72: 25.0000 ug | MEDICATED_PATCH | TRANSDERMAL | Status: DC

## 2016-06-26 MED ORDER — ISOSORBIDE MONONITRATE ER 60 MG PO TB24
90.0000 mg | ORAL_TABLET | Freq: Every day | ORAL | Status: DC
  Administered 2016-06-27: 90 mg via ORAL
  Filled 2016-06-26 (×2): qty 1

## 2016-06-26 MED ORDER — ACETAMINOPHEN 325 MG PO TABS: 650.0000 mg | ORAL_TABLET | Freq: Four times a day (QID) | ORAL | Status: DC | PRN

## 2016-06-26 MED ORDER — SODIUM CHLORIDE 0.9 % IV SOLN
1000.0000 mL | INTRAVENOUS | Status: DC
  Administered 2016-06-26 – 2016-06-27 (×2): 1000 mL via INTRAVENOUS

## 2016-06-26 NOTE — Patient Instructions (Signed)
Fever, Adult °A fever is an increase in the body’s temperature. It is usually defined as a temperature of 100°F (38°C) or higher. Brief mild or moderate fevers generally have no long-term effects, and they often do not require treatment. Moderate or high fevers may make you feel uncomfortable and can sometimes be a sign of a serious illness or disease. The sweating that may occur with repeated or prolonged fever may also cause dehydration. °Fever is confirmed by taking a temperature with a thermometer. A measured temperature can vary with: °· Age. °· Time of day. °· Location of the thermometer: °¨ Mouth (oral). °¨ Rectum (rectal). °¨ Ear (tympanic). °¨ Underarm (axillary). °¨ Forehead (temporal). °Follow these instructions at home: °Pay attention to any changes in your symptoms. Take these actions to help with your condition: °· Take over-the counter and prescription medicines only as told by your health care provider. Follow the dosing instructions carefully. °· If you were prescribed an antibiotic medicine, take it as told by your health care provider. Do not stop taking the antibiotic even if you start to feel better. °· Rest as needed. °· Drink enough fluid to keep your urine clear or pale yellow. This helps to prevent dehydration. °· Sponge yourself or bathe with room-temperature water to help reduce your body temperature as needed. Do not use ice water. °· Do not overbundle yourself in blankets or heavy clothes. °Contact a health care provider if: °· You vomit. °· You cannot eat or drink without vomiting. °· You have diarrhea. °· You have pain when you urinate. °· Your symptoms do not improve with treatment. °· You develop new symptoms. °· You develop excessive weakness. °Get help right away if: °· You have shortness of breath or have trouble breathing. °· You are dizzy or you faint. °· You are disoriented or confused. °· You develop signs of dehydration, such as a dry mouth, decreased urination, or  paleness. °· You develop severe pain in your abdomen. °· You have persistent vomiting or diarrhea. °· You develop a skin rash. °· Your symptoms suddenly get worse. °This information is not intended to replace advice given to you by your health care provider. Make sure you discuss any questions you have with your health care provider. °Document Released: 06/13/2000 Document Revised: 05/26/2015 Document Reviewed: 02/11/2014 °Elsevier Interactive Patient Education © 2017 [employer] ° °

## 2016-06-26 NOTE — Progress Notes (Addendum)
Pharmacy Antibiotic Note  Andres Smith is a 76 y.o. male admitted on 06/26/2016 with febrile neutropenia.  Pharmacy has been consulted for Vancomycin dosing. Patient also ordered one dose of Zosyn in ED.   Plan: Vancomycin 1500mg  IV x 1, then 1g IV q12h. Plan for Vancomycin trough level at steady state. Goal trough level 15-20 mcg/mL.  Monitor renal function, cultures, clinical course.  F/u for continuation of gram negative antimicrobial coverage.   Addendum: Received order to continue Zosyn upon admission:   Zosyn 3.375g IV q8h (infuse over 4 hours)     Temp (24hrs), Avg:100.4 F (38 C), Min:99.9 F (37.7 C), Max:101.2 F (38.4 C)   Recent Labs Lab 06/20/16 1427 06/20/16 1428 06/26/16 1923 06/26/16 1936  WBC  --  3.3* 1.9*  --   CREATININE 1.0  --  0.85  --   LATICACIDVEN  --   --   --  0.72    Estimated Creatinine Clearance: 73.9 mL/min (by C-G formula based on SCr of 0.85 mg/dL).    No Known Allergies  Antimicrobials this admission: 6/26 >> Zosyn x 1 6/26 >> Vancomycin >>  Dose adjustments this admission: --  Microbiology results: 6/26 BCx: sent 6/26 UCx: sent    Thank you for allowing pharmacy to be a part of this patient's care.   Lindell Spar, PharmD, BCPS Pager: [phone] 06/26/2016 7:56 PM

## 2016-06-26 NOTE — Progress Notes (Signed)
Subjective:  Patient ID: Andres Smith, male    DOB: [birth year]  Age: 76 y.o. MRN: [id number]  CC: Fever   HPI [name] presents for A 2 day history of worsening fever with persistent cough over the last few weeks. He also complains over the last month that he has developed a worsening foot drop on the right side. He complains of fatigue, loss of appetite, and weight loss. 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 He recently had to suspend his chemotherapy because of neutropenia.  Outpatient Medications Prior to Visit  Medication Sig Dispense Refill  . aspirin EC 81 MG tablet Take 81 mg by mouth daily.    . balsalazide (COLAZAL) 750 MG capsule Take 3 capsules (2,250 mg total) by mouth 3 (three) times daily. 270 capsule 3  . cyanocobalamin (,VITAMIN B-12,) 1000 MCG/ML injection INJECT 1ML ONCE MONTHLY. 3 mL 1  . lidocaine-prilocaine (EMLA) cream Apply to affected area once 30 g 3  . mercaptopurine (PURINETHOL) 50 MG tablet TAKE ONE TABLET ONCE A DAY ON AN EMPTY STOMACH 1 HR BEFORE OR 2HRS AFTER A MEAL 30 tablet 2  . Morphine Sulfate (MORPHINE CONCENTRATE) 10 mg / 0.5 ml concentrated solution Place 0.5 mLs (10 mg total) into feeding tube every 2 (two) hours as needed for severe pain. 120 mL 0  . Nutritional Supplements (FEEDING SUPPLEMENT, OSMOLITE 1.5 CAL,) LIQD Give 1 and 1/2 cans Osmolite 1.5 via PEG 4 times daily with 60 mL free water before and after bolus feedings. In addition, drink or flush PEG with 240 mL free water TID. Send formula and supplies. 6 Bottle 0  . atorvastatin (LIPITOR) 80 MG tablet Take 80 mg by mouth at bedtime.    . fentaNYL (DURAGESIC - DOSED MCG/HR) 25 MCG/HR patch Place 1 patch (25 mcg total) onto the skin every 3 (three) days. (Patient not taking: Reported on 06/11/2016) 5 patch 0  . isosorbide mononitrate (IMDUR) 60 MG 24 hr tablet Take 90 mg by mouth daily.    Marland Kitchen lidocaine (XYLOCAINE) 2 % solution Patient: Mix 1part 2%  viscous lidocaine, 1part H20. Swish & swallow 21mL of diluted mixture, 51min before meals and at bedtime, up to QID (Patient not taking: Reported on 06/26/2016) 100 mL 5  . mesalamine (LIALDA) 1.2 g EC tablet Take 2.4 g by mouth daily with breakfast.    . metFORMIN (GLUCOPHAGE) 500 MG tablet Take 1 tablet (500 mg total) by mouth 2 (two) times daily with a meal. Must Keep 05/16/15 appt for future refills (Patient not taking: Reported on 06/26/2016) 180 tablet 0  . nitroGLYCERIN (NITROSTAT) 0.4 MG SL tablet Place 0.4 mg under the tongue every 5 (five) minutes as needed for chest pain.    Marland Kitchen nystatin (MYCOSTATIN) 100000 UNIT/ML suspension Take 5 mLs (500,000 Units total) by mouth 4 (four) times daily. (Patient not taking: Reported on 06/26/2016) 200 mL 0  . ondansetron (ZOFRAN) 8 MG tablet Take 1 tablet (8 mg total) by mouth 2 (two) times daily as needed. Start on the third day after chemotherapy. (Patient not taking: Reported on 06/13/2016) 30 tablet 1  . oxyCODONE (OXY IR/ROXICODONE) 5 MG immediate release tablet Take 5 mg by mouth every 6 (six) hours as needed for severe pain.    Marland Kitchen prochlorperazine (COMPAZINE) 10 MG tablet Take 1 tablet (10 mg total) by mouth every 6 (six) hours as needed (Nausea or vomiting). (Patient not taking: Reported on 06/13/2016) 30 tablet 1  No facility-administered medications prior to visit.     ROS Review of Systems  Constitutional: Positive for activity change, appetite change, fatigue, fever and unexpected weight change. Negative for diaphoresis.  HENT: Positive for facial swelling and trouble swallowing. Negative for sinus pressure and voice change.   Eyes: Negative.  Negative for visual disturbance.  Respiratory: Positive for cough and shortness of breath. Negative for chest tightness, wheezing and stridor.   Cardiovascular: Negative.  Negative for chest pain, palpitations and leg swelling.  Gastrointestinal: Negative for abdominal pain, constipation, nausea and  vomiting.  Genitourinary: Negative.  Negative for difficulty urinating, dysuria and urgency.  Musculoskeletal: Negative.   Skin: Positive for color change.  Allergic/Immunologic: Negative.   Neurological: Negative.   Hematological: Negative.  Negative for adenopathy. Does not bruise/bleed easily.  Psychiatric/Behavioral: Negative.     Objective:  BP (!) 110/48 (BP Location: Left Arm, Patient Position: Sitting, Cuff Size: Normal)   Pulse 86   Temp (!) 101.2 F (38.4 C) (Oral)   Resp 20   Ht 5\' 9"  (1.753 m)   Wt 184 lb 3 oz (83.5 kg)   SpO2 (!) 87%   BMI 27.20 kg/m   BP Readings from Last 3 Encounters:  06/26/16 (!) 110/48  06/21/16 (!) 109/56  06/15/16 (!) 134/55    Wt Readings from Last 3 Encounters:  06/26/16 184 lb 3 oz (83.5 kg)  06/21/16 181 lb 6.4 oz (82.3 kg)  06/18/16 180 lb 6.4 oz (81.8 kg)    Physical Exam  Constitutional: He is oriented to person, place, and time.  Non-toxic appearance. He has a sickly appearance. He appears ill. He appears distressed.  HENT:  Head:    Mouth/Throat: Oropharynx is clear and moist. No oropharyngeal exudate.  Eyes: Conjunctivae are normal. Right eye exhibits no discharge. Left eye exhibits no discharge. No scleral icterus.  Neck: Normal range of motion. Neck supple. No JVD present. No thyromegaly present.  Cardiovascular: Normal rate, regular rhythm and intact distal pulses.  Exam reveals no gallop and no friction rub.   Murmur heard. Pulmonary/Chest: Effort normal and breath sounds normal. No respiratory distress. He has no wheezes. He has no rales. He exhibits no tenderness.  Abdominal: Soft. Bowel sounds are normal. He exhibits no distension and no mass. There is no tenderness. There is no rebound and no guarding.  Musculoskeletal: Normal range of motion. He exhibits no edema, tenderness or deformity.  Neurological: He is alert and oriented to person, place, and time. He displays atrophy. He displays no tremor. He exhibits  abnormal muscle tone. He displays a negative Romberg sign. He displays no seizure activity. Coordination and gait abnormal.  Dense paresis in the right foot  Skin: Skin is warm and dry. No rash noted. No erythema. No pallor.  Vitals reviewed.   Lab Results  Component Value Date   WBC 3.3 (L) 06/20/2016   HGB 9.6 (L) 06/20/2016   HCT 27.8 (L) 06/20/2016   PLT 87 (L) 06/20/2016   GLUCOSE 242 (H) 06/20/2016   CHOL 136 05/16/2015   TRIG 90.0 05/16/2015   HDL 58.10 05/16/2015   LDLCALC 60 05/16/2015   ALT 10 06/20/2016   AST 11 06/20/2016   NA 136 06/20/2016   K 5.8 (H) 06/20/2016   CL 102 05/22/2016   CREATININE 1.0 06/20/2016   BUN 24.1 06/20/2016   CO2 31 (H) 06/20/2016   TSH 0.781 06/06/2016   PSA 2.29 05/06/2012   INR 1.02 05/21/2016   HGBA1C 7.3 (H) 05/16/2015  MICROALBUR 13.2 (H) 05/16/2015    No results found.  Assessment & Plan:   Yacob was seen today for fever.  Diagnoses and all orders for this visit:  Neutropenic fever ([place])- I think he needs or urgent workup to find the source of infection and probably needs broad-spectrum IV antibiotics so I sent him to the ED for an immediate evaluation and treatment.   I am having Mr. Malson maintain his metFORMIN, mercaptopurine, cyanocobalamin, atorvastatin, isosorbide mononitrate, nitroGLYCERIN, aspirin EC, oxyCODONE, lidocaine-prilocaine, ondansetron, prochlorperazine, mesalamine, lidocaine, fentaNYL, morphine CONCENTRATE, nystatin, feeding supplement (OSMOLITE 1.5 CAL), and balsalazide.  No orders of the defined types were placed in this encounter.    Follow-up: Return if symptoms worsen or fail to improve.  [name], MD

## 2016-06-26 NOTE — ED Notes (Signed)
ED Provider at bedside. 

## 2016-06-26 NOTE — ED Triage Notes (Signed)
Pt sent by PCP due to fever on neutropenia. Pt was visiting PCP due to foot drop, PCP sent pt here due to fever. Pt also reports difficulty swallowing and weakness x 2 weeks. Hx oral cancer, last radiation treatment today.

## 2016-06-26 NOTE — ED Notes (Signed)
Hospitalist at bedside 

## 2016-06-26 NOTE — H&P (Addendum)
History and Physical    Andres Smith PJK:[id number] DOB: 08/23/1940 DOA: 06/26/2016  PCP: Janith Lima, MD  Patient coming from: Home.  Chief Complaint: Fever.  HPI: Andres Smith is a 76 y.o. 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 Patient states over the last 3-4 months patient has been feeling weak on the right foot area. Had gone to visit his PCP for this and was found to be febrile and since PCP was concerned for neutropenic fever patient was referred to the ER.   ED Course: In the ER patient's blood work showed pancytopenia and patient was febrile with temperatures around 101.26F. Patient did have radiation done today. And skin on the neck area shows radiation changes with mild discoloration. Chest x-ray was unremarkable UA did not show any definite signs of infection. Given the pancytopenia and fever patient is being admitted for empiric antibiotics. Blood cultures were obtained.  Review of Systems: As per HPI, rest all negative.   Past Medical History:  Diagnosis Date  . Ankylosing spondylitis ([place])   . Blood transfusion    hx of transfusion without reaction  . Bradycardia   . Cancer ([place]) 03/2016   oral  . Cholelithiasis   . Coronary artery disease    a. s/p CABG x 6 in 1997 (LIMA->LAD, VG->RI ->OM1->OM2, VG->PDA->PLV // b. 05/2011 Cath:  patent grafs, native prox rca and d2 dzs  ->med rx. // c. Myoview 1/18: not gated, large inferolateral scar, no ischemia; Intermediate Risk (IL scar old - on prior studies >> med rx)  . Crohn's colitis ([place]) 12/01/1997  . Diabetes mellitus   . Dysphagia   . Fatty liver   . GERD (gastroesophageal reflux disease)   . Heart block   . Hiatal hernia 12/03/2008  . History of skin cancer   . Hx of echocardiogram    Echo 5/14:  Mild LVH, EF 55-60%, NL diast fxn,  mild LAE   . Hyperlipidemia   . Hypertension   . Kidney stones   . Neck pain   . Pneumonia    hx of PNA  . 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 cancer removal  2014   removed from left outter leg  . COLONOSCOPY  2012  . CORONARY ARTERY BYPASS GRAFT  04/19/1995   x7  . EXCISION ORAL TUMOR Left    left jaw and lymph node  . HERNIA REPAIR  1989  . IR FLUORO GUIDE PORT INSERTION RIGHT  05/21/2016  . IR GASTROSTOMY TUBE MOD SED  05/21/2016  . IR US GUIDE VASC ACCESS RIGHT  05/21/2016  . LEFT HEART CATHETERIZATION WITH CORONARY/GRAFT ANGIOGRAM N/A 05/24/2011   Procedure: LEFT HEART CATHETERIZATION WITH Beatrix Fetters;  Surgeon: Larey Dresser, MD;  Location: [facility] CATH LAB;  Service: Cardiovascular;  Laterality: N/A;  . MOHS SURGERY     X 2 off chin and nose  . TONSILLECTOMY AND ADENOIDECTOMY  1944  . TRIGGER FINGER RELEASE    . UMBILICAL HERNIA REPAIR       reports that he quit smoking about 33 years ago. He quit smokeless tobacco use about 6 years ago. His smokeless tobacco use included Chew. He reports that he drinks alcohol. He reports that he does not use drugs.  No  Known Allergies  Family History  Problem Relation Age of Onset  . Heart disease Father   . Heart disease Mother   . Arthritis Mother   . Heart disease Unknown   . Crohn's disease Unknown   . Heart disease Sister   . Cancer Paternal Grandmother        colon  . Colon cancer Neg Hx     Prior to Admission medications   Medication Sig Start Date End Date Taking? Authorizing Provider  aspirin EC 81 MG tablet Take 81 mg by mouth daily.   Yes [provider]  atorvastatin (LIPITOR) 80 MG tablet Take 80 mg by mouth at bedtime.   Yes [provider]  balsalazide (COLAZAL) 750 MG capsule Take 3 capsules (2,250 mg total) by mouth 3 (three) times daily. 06/20/16  Yes Pyrtle, Lajuan Lines, MD  cyanocobalamin (,VITAMIN B-12,)  1000 MCG/ML injection INJECT 1ML ONCE MONTHLY. 05/22/16  Yes Janith Lima, MD  fentaNYL (DURAGESIC - DOSED MCG/HR) 25 MCG/HR patch Place 1 patch (25 mcg total) onto the skin every 3 (three) days. 06/07/16  Yes Gorsuch, Ni, MD  isosorbide mononitrate (IMDUR) 60 MG 24 hr tablet Take 90 mg by mouth daily.   Yes [provider]  lidocaine-prilocaine (EMLA) cream Apply to affected area once 05/23/16  Yes Gorsuch, Ni, MD  Morphine Sulfate (MORPHINE CONCENTRATE) 10 mg / 0.5 ml concentrated solution Place 0.5 mLs (10 mg total) into feeding tube every 2 (two) hours as needed for severe pain. 06/07/16  Yes Gorsuch, Ni, MD  nitroGLYCERIN (NITROSTAT) 0.4 MG SL tablet Place 0.4 mg under the tongue every 5 (five) minutes as needed for chest pain.   Yes [provider]  Nutritional Supplements (FEEDING SUPPLEMENT, OSMOLITE 1.5 CAL,) LIQD Give 1 and 1/2 cans Osmolite 1.5 via PEG 4 times daily with 60 mL free water before and after bolus feedings. In addition, drink or flush PEG with 240 mL free water TID. Send formula and supplies. 06/19/16  Yes Gorsuch, Ni, MD  oxyCODONE (OXY IR/ROXICODONE) 5 MG immediate release tablet Take 5 mg by mouth every 6 (six) hours as needed for severe pain.   Yes [provider]  polyethylene glycol (MIRALAX / GLYCOLAX) packet Place 17 g into feeding tube daily.   Yes [provider]  Sennosides (SENEXON PO) Place 1 packet into feeding tube daily as needed (constipation).   Yes [provider]  lidocaine (XYLOCAINE) 2 % solution Patient: Mix 1part 2% viscous lidocaine, 1part H20. Swish & swallow 3mL of diluted mixture, 65min before meals and at bedtime, up to QID Patient not taking: Reported on 06/26/2016 06/04/16   [name], MD  mercaptopurine (PURINETHOL) 50 MG tablet TAKE ONE TABLET ONCE A DAY ON AN EMPTY STOMACH 1 HR BEFORE OR 2HRS AFTER A MEAL 04/02/16   Pyrtle, Lajuan Lines, MD  metFORMIN (GLUCOPHAGE) 500 MG tablet Take 1 tablet (500 mg total) by  mouth 2 (two) times daily with a meal. Must Keep 05/16/15 appt for future refills Patient not taking: Reported on 06/26/2016 04/19/15   Janith Lima, MD  nystatin (MYCOSTATIN) 100000 UNIT/ML suspension Take 5 mLs (500,000 Units total) by mouth 4 (four) times daily. Patient not taking: Reported on 06/26/2016 06/07/16   [name], MD  ondansetron (ZOFRAN) 8 MG tablet Take 1 tablet (8 mg total) by mouth 2 (two) times daily as needed. Start on the third day after chemotherapy. 05/23/16   [name], MD  prochlorperazine (COMPAZINE) 10 MG tablet Take  1 tablet (10 mg total) by mouth every 6 (six) hours as needed (Nausea or vomiting). 05/23/16   [name], MD    Physical Exam: Vitals:   06/26/16 2100 06/26/16 2115 06/26/16 2130 06/26/16 2145  BP: (!) 129/59 121/61 (!) 130/57 140/83  Pulse: 89 89 92 95  Resp: 19 18 19 16   Temp:      TempSrc:      SpO2: 95% 95% 92% 97%      Constitutional: Moderately built and nourished. Vitals:   06/26/16 2100 06/26/16 2115 06/26/16 2130 06/26/16 2145  BP: (!) 129/59 121/61 (!) 130/57 140/83  Pulse: 89 89 92 95  Resp: 19 18 19 16   Temp:      TempSrc:      SpO2: 95% 95% 92% 97%   Eyes: Anicteric no pallor. ENMT: Mucositis. No active discharge. Neck: Erythema around the neck. More on the left side. Respiratory: No rhonchi or crepitations. Cardiovascular: S1-S2. No murmurs appreciated. Abdomen: Soft nontender bowel sounds present. No guarding or rigidity. PEG tube seen. Musculoskeletal mild edema. Skin: Erythema around the neck. Neurologic: Alert awake oriented to time place and person. Mild weakness of the right lower extremity. On the foot area. Psychiatric: Appears normal. Normal affect.   Labs on Admission: I have personally reviewed following labs and imaging studies  CBC:  Recent Labs Lab 06/20/16 1428 06/26/16 1923  WBC 3.3* 1.9*  NEUTROABS 2.4 1.0*  HGB 9.6* 8.5*  HCT 27.8* 24.3*  MCV 103.0* 99.6  PLT 87* 644*   Basic Metabolic  Panel:  Recent Labs Lab 06/20/16 1427 06/26/16 1923  NA 136 132*  K 5.8* 4.4  CL  --  97*  CO2 31* 26  GLUCOSE 242* 186*  BUN 24.1 29*  CREATININE 1.0 0.85  CALCIUM 9.2 8.5*  MG 1.8  --    GFR: Estimated Creatinine Clearance: 73.9 mL/min (by C-G formula based on SCr of 0.85 mg/dL). Liver Function Tests:  Recent Labs Lab 06/20/16 1427 06/26/16 1923  AST 11 17  ALT 10 11*  ALKPHOS 56 46  BILITOT 0.45 0.2*  PROT 6.4 6.4*  ALBUMIN 3.3* 3.2*   No results for input(s): LIPASE, AMYLASE in the last 168 hours. No results for input(s): AMMONIA in the last 168 hours. Coagulation Profile: No results for input(s): INR, PROTIME in the last 168 hours. Cardiac Enzymes: No results for input(s): CKTOTAL, CKMB, CKMBINDEX, TROPONINI in the last 168 hours. BNP (last 3 results) No results for input(s): PROBNP in the last 8760 hours. HbA1C: No results for input(s): HGBA1C in the last 72 hours. CBG: No results for input(s): GLUCAP in the last 168 hours. Lipid Profile: No results for input(s): CHOL, HDL, LDLCALC, TRIG, CHOLHDL, LDLDIRECT in the last 72 hours. Thyroid Function Tests: No results for input(s): TSH, T4TOTAL, FREET4, T3FREE, THYROIDAB in the last 72 hours. Anemia Panel: No results for input(s): VITAMINB12, FOLATE, FERRITIN, TIBC, IRON, RETICCTPCT in the last 72 hours. Urine analysis:    Component Value Date/Time   COLORURINE YELLOW 05/22/2016 [place] 05/22/2016 1414   LABSPEC 1.020 05/22/2016 1414   PHURINE 5.0 05/22/2016 1414   GLUCOSEU NEGATIVE 05/22/2016 1414   GLUCOSEU NEGATIVE 05/16/2015 1621   HGBUR NEGATIVE 05/22/2016 1414   BILIRUBINUR NEGATIVE 05/22/2016 1414   KETONESUR NEGATIVE 05/22/2016 1414   PROTEINUR NEGATIVE 05/22/2016 1414   UROBILINOGEN 0.2 05/16/2015 1621   NITRITE NEGATIVE 05/22/2016 1414   LEUKOCYTESUR NEGATIVE 05/22/2016 1414   Sepsis Labs: @LABRCNTIP (procalcitonin:4,lacticidven:4) )No results found for this  or any  previous visit (from the past 240 hour(s)).   Radiological Exams on Admission: Dg Chest 2 View  Result Date: 06/26/2016 CLINICAL DATA:  Patient with abnormal vital signs. Recent chemotherapy. EXAM: CHEST  2 VIEW COMPARISON:  Chest radiograph 05/22/2016. FINDINGS: Right anterior chest wall Port-A-Cath is present with tip projecting over the superior vena cava. Patient status post median sternotomy. Stable cardiac and mediastinal contours. Low lung volumes. No large area pulmonary consolidation. No pleural effusion or pneumothorax. Thoracic spine degenerative changes. IMPRESSION: Low lung volumes.  No acute cardiopulmonary process. Electronically Signed   By: Lovey Newcomer M.D.   On: 06/26/2016 18:27   Ct Head Wo Contrast  Result Date: 06/26/2016 CLINICAL DATA:  Foot drop and fever. EXAM: CT HEAD WITHOUT CONTRAST TECHNIQUE: Contiguous axial images were obtained from the base of the skull through the vertex without intravenous contrast. COMPARISON:  None. FINDINGS: Brain: There is no evidence for acute hemorrhage, hydrocephalus, mass lesion, or abnormal extra-axial fluid collection. No definite CT evidence for acute infarction. Diffuse loss of parenchymal volume is consistent with atrophy. Patchy low attenuation in the deep hemispheric and periventricular white matter is nonspecific, but likely reflects chronic microvascular ischemic demyelination. 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 Vascular: No hyperdense vessel or unexpected calcification. Skull: No evidence for fracture. No worrisome lytic or sclerotic lesion. Sinuses/Orbits: The visualized paranasal sinuses and mastoid air cells are clear. Visualized portions of the globes and intraorbital fat are unremarkable. Other: None. IMPRESSION: 1. No acute intracranial abnormality 2. Atrophy with chronic small vessel white matter ischemic disease. 3. Lacunar infarct left cerebellar hemisphere. Electronically Signed   By: Misty Stanley M.D.   On: 06/26/2016  20:42     Assessment/Plan Principal Problem:   Fever Active Problems:   Type II diabetes mellitus with manifestations (HCC)   Essential hypertension, benign   Coronary atherosclerosis of native coronary artery   Ulcerative colitis ([facility])   B12 deficiency anemia   Mucositis due to chemotherapy   Acquired pancytopenia ([place])    1. Fever with pancytopenia - absolute neutrophil count is levels more than 500. Source of fever not clear. Follow blood cultures. Could be from skin. Continue with empiric antibiotics. 2. CAD status post CABG - patient states recently he ran out of his Imdur. Otherwise denies any chest pain. On statins aspirin. 3. Ulcerative colitis - on Colazal. No signs of any acute exacerbation. 4. Buccal mucosa cancer - patient is on chemoradiation. Chemotherapy has been held due to pancytopenia by patient's oncologist. Had radiation treatment yesterday. Follow CBC. 5. PEG tube feeds - discussed with pharmacy to dose the Osmolite. 6. Diabetes mellitus type 2 - patient states he has stopped taking metformin for now. We'll follow CBGs and sliding scale coverage. 7. B 12 deficiency on replacement. 8. Right lower extremity weakness - CT head unremarkable. May consider MRI.   DVT prophylaxis: Lovenox. Code Status: DO NOT RESUSCITATE. Family Communication: Discussed with patient.  Disposition Plan: Home.  Consults called: None.  Admission status: Observation.    Rise Patience MD [facility] Pager [phone].  If 7PM-7AM, please contact night-coverage [email or web address] Password [email or web address]  06/26/2016, 10:16 PM

## 2016-06-26 NOTE — Telephone Encounter (Signed)
Patient is in the office today for an echo and his wife say's that patient is having difficulty swallowing imdur 90 mg and was told by her pharmacist that it can't be crushed. Nurse confirmed with wife that the er imdur can not be crushed. Per wife, patient currently has a Peg tube for nourishment and that patient is crushing all of his other medications or has the liquid form. Spoke with pharmacist and she suggested switching patient to the immediate release form of imdur vs extended release so that it can be crushed taking imdur ir 20 mg (2) + imdur 10 mg (1/2=5 mg) twice daily (total of 45 mg twice daily) while patient is having swallowing issues and has the peg-tube. Please advise if this is okay.

## 2016-06-26 NOTE — Progress Notes (Signed)
Patient did not show up for nutrition appointment. 

## 2016-06-26 NOTE — ED Notes (Signed)
Patient transported to CT 

## 2016-06-26 NOTE — ED Provider Notes (Signed)
[place] DEPT Provider Note   CSN: [id number] Arrival date & time: 06/26/16  1618   History   Chief Complaint No chief complaint on file.   HPI Andres Smith is a 76 y.o. male.  HPI Pt presents to the ED for evaluation for fever.  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  He had radiation treatments today.  Last chemo treatment was a couple of weeks ago.  He was not able to get his recent treatments because of the cell counts.  Today he went to his primary care doctors office because of difficulty with weakness in his right foot.  While in the office his doctor noted that he had a fever.  He was sent to the ED to evaluate for possible neutropenic fever.  No cough.  No vomiting.  The rash on his neck associated with his radiation treatments has been looking worse. Past Medical History:  Diagnosis Date  . Ankylosing spondylitis ([place])   . Blood transfusion    hx of transfusion without reaction  . Bradycardia   . Cancer ([place]) 03/2016   oral  . Cholelithiasis   . Coronary artery disease    a. s/p CABG x 6 in 1997 (LIMA->LAD, VG->RI ->OM1->OM2, VG->PDA->PLV // b. 05/2011 Cath:  patent grafs, native prox rca and d2 dzs  ->med rx. // c. Myoview 1/18: not gated, large inferolateral scar, no ischemia; Intermediate Risk (IL scar old - on prior studies >> med rx)  . Crohn's colitis ([place]) 12/01/1997  . Diabetes mellitus   . Dysphagia   . Fatty liver   . GERD (gastroesophageal reflux disease)   . Heart block   . Hiatal hernia 12/03/2008  . History of skin cancer   . Hx of echocardiogram    Echo 5/14:  Mild LVH, EF 55-60%, NL diast fxn, mild LAE   . Hyperlipidemia   . Hypertension   . Kidney stones   . Neck pain   . Pneumonia    hx of PNA  . 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 cancer removal  2014   removed from left outter leg  . COLONOSCOPY  2012  . CORONARY ARTERY BYPASS  GRAFT  04/19/1995   x7  . EXCISION ORAL TUMOR Left    left jaw and lymph node  . HERNIA REPAIR  1989  . IR FLUORO GUIDE PORT INSERTION RIGHT  05/21/2016  . IR GASTROSTOMY TUBE MOD SED  05/21/2016  . IR US GUIDE VASC ACCESS RIGHT  05/21/2016  . LEFT HEART CATHETERIZATION WITH CORONARY/GRAFT ANGIOGRAM N/A 05/24/2011   Procedure: LEFT HEART CATHETERIZATION WITH Beatrix Fetters;  Surgeon: Larey Dresser, MD;  Location: [facility] CATH LAB;  Service: Cardiovascular;  Laterality: N/A;  . MOHS SURGERY     X 2 off chin and nose  . TONSILLECTOMY AND ADENOIDECTOMY  1944  . TRIGGER FINGER RELEASE    . UMBILICAL HERNIA REPAIR         Home Medications    Prior to Admission medications   Medication Sig Start Date End Date Taking? Authorizing Provider  balsalazide (COLAZAL) 750 MG capsule Take 3 capsules (2,250 mg total) by mouth 3 (three) times daily. 06/20/16  Yes Pyrtle, Lajuan Lines, MD  cyanocobalamin (,VITAMIN B-12,) 1000 MCG/ML injection INJECT 1ML ONCE MONTHLY. 05/22/16  Yes Janith Lima, MD  aspirin EC 81 MG tablet Take 81 mg by mouth daily.  [provider]  atorvastatin (LIPITOR) 80 MG tablet Take 80 mg by mouth at bedtime.    [provider]  fentaNYL (DURAGESIC - DOSED MCG/HR) 25 MCG/HR patch Place 1 patch (25 mcg total) onto the skin every 3 (three) days. 06/07/16   [name], MD  isosorbide mononitrate (IMDUR) 60 MG 24 hr tablet Take 90 mg by mouth daily.    [provider]  lidocaine (XYLOCAINE) 2 % solution Patient: Mix 1part 2% viscous lidocaine, 1part H20. Swish & swallow 37mL of diluted mixture, 26min before meals and at bedtime, up to QID Patient not taking: Reported on 06/26/2016 06/04/16   [name], MD  lidocaine-prilocaine (EMLA) cream Apply to affected area once 05/23/16   [name], MD  mercaptopurine (PURINETHOL) 50 MG tablet TAKE ONE TABLET ONCE A DAY ON AN EMPTY STOMACH 1 HR BEFORE OR 2HRS AFTER A MEAL 04/02/16   Pyrtle, Lajuan Lines, MD  metFORMIN  (GLUCOPHAGE) 500 MG tablet Take 1 tablet (500 mg total) by mouth 2 (two) times daily with a meal. Must Keep 05/16/15 appt for future refills Patient not taking: Reported on 06/26/2016 04/19/15   Janith Lima, MD  Morphine Sulfate (MORPHINE CONCENTRATE) 10 mg / 0.5 ml concentrated solution Place 0.5 mLs (10 mg total) into feeding tube every 2 (two) hours as needed for severe pain. 06/07/16   [name], MD  nitroGLYCERIN (NITROSTAT) 0.4 MG SL tablet Place 0.4 mg under the tongue every 5 (five) minutes as needed for chest pain.    [provider]  Nutritional Supplements (FEEDING SUPPLEMENT, OSMOLITE 1.5 CAL,) LIQD Give 1 and 1/2 cans Osmolite 1.5 via PEG 4 times daily with 60 mL free water before and after bolus feedings. In addition, drink or flush PEG with 240 mL free water TID. Send formula and supplies. 06/19/16   [name], MD  nystatin (MYCOSTATIN) 100000 UNIT/ML suspension Take 5 mLs (500,000 Units total) by mouth 4 (four) times daily. Patient not taking: Reported on 06/26/2016 06/07/16   [name], MD  ondansetron (ZOFRAN) 8 MG tablet Take 1 tablet (8 mg total) by mouth 2 (two) times daily as needed. Start on the third day after chemotherapy. Patient not taking: Reported on 06/13/2016 05/23/16   [name], MD  oxyCODONE (OXY IR/ROXICODONE) 5 MG immediate release tablet Take 5 mg by mouth every 6 (six) hours as needed for severe pain.    [provider]  prochlorperazine (COMPAZINE) 10 MG tablet Take 1 tablet (10 mg total) by mouth every 6 (six) hours as needed (Nausea or vomiting). Patient not taking: Reported on 06/13/2016 05/23/16   [name], MD    Family History Family History  Problem Relation Age of Onset  . Heart disease Father   . Heart disease Mother   . Arthritis Mother   . Heart disease Unknown   . Crohn's disease Unknown   . Heart disease Sister   . Cancer Paternal Grandmother        colon  . Colon cancer Neg Hx     Social History Social History    Substance Use Topics  . Smoking status: Former Smoker    Quit date: 01/02/1983  . Smokeless tobacco: Former Systems developer    Types: Chew    Quit date: 01/01/2010  . Alcohol use Yes     Comment: tottie every night     Allergies   Patient has no known allergies.   Review of Systems Review of Systems  Constitutional: Positive for chills and  fever.  All other systems reviewed and are negative.    Physical Exam Updated Vital Signs BP 129/64   Pulse 82   Temp 100.2 F (37.9 C) (Oral)   Resp 17   SpO2 94%   Physical Exam  Constitutional: No distress.  HENT:  Head: Normocephalic and atraumatic.  Right Ear: External ear normal.  Left Ear: External ear normal.  Eyes: Conjunctivae are normal. Right eye exhibits no discharge. Left eye exhibits no discharge. No scleral icterus.  Neck: Neck supple. No tracheal deviation present.  Cardiovascular: Normal rate, regular rhythm and intact distal pulses.   Pulmonary/Chest: Effort normal and breath sounds normal. No stridor. No respiratory distress. He has no wheezes. He has no rales.  Abdominal: Soft. Bowel sounds are normal. He exhibits no distension. There is no tenderness. There is no rebound and no guarding.  Musculoskeletal: He exhibits no edema or tenderness.  Neurological: He is alert. No cranial nerve deficit (no facial droop, extraocular movements intact, no slurred speech) or sensory deficit. He exhibits normal muscle tone. He displays no seizure activity. Coordination normal.  Right foot drop, 5/5 plantar flexion bilaterally, normal ue strength, no cranial nerve defect  Skin: Skin is warm and dry. No rash noted.  Erythema of the face below the lips on the left side, crusting weeping wound on the upper aspect of the chest and left side of the neck  Psychiatric: He has a normal mood and affect.  Nursing note and vitals reviewed.    ED Treatments / Results  Labs (all labs ordered are listed, but only abnormal results are  displayed) Labs Reviewed  COMPREHENSIVE METABOLIC PANEL - Abnormal; Notable for the following:       Result Value   Sodium 132 (*)    Chloride 97 (*)    Glucose, Bld 186 (*)    BUN 29 (*)    Calcium 8.5 (*)    Total Protein 6.4 (*)    Albumin 3.2 (*)    ALT 11 (*)    Total Bilirubin 0.2 (*)    All other components within normal limits  CBC WITH DIFFERENTIAL/PLATELET - Abnormal; Notable for the following:    WBC 1.9 (*)    RBC 2.44 (*)    Hemoglobin 8.5 (*)    HCT 24.3 (*)    MCH 34.8 (*)    Platelets 138 (*)    Neutro Abs 1.0 (*)    Lymphs Abs 0.3 (*)    All other components within normal limits  CULTURE, BLOOD (ROUTINE X 2)  CULTURE, BLOOD (ROUTINE X 2)  URINE CULTURE  URINALYSIS, ROUTINE W REFLEX MICROSCOPIC  CREATININE, SERUM  I-STAT CG4 LACTIC ACID, ED  I-STAT CG4 LACTIC ACID, ED    Radiology Dg Chest 2 View  Result Date: 06/26/2016 CLINICAL DATA:  Patient with abnormal vital signs. Recent chemotherapy. EXAM: CHEST  2 VIEW COMPARISON:  Chest radiograph 05/22/2016. FINDINGS: Right anterior chest wall Port-A-Cath is present with tip projecting over the superior vena cava. Patient status post median sternotomy. Stable cardiac and mediastinal contours. Low lung volumes. No large area pulmonary consolidation. No pleural effusion or pneumothorax. Thoracic spine degenerative changes. IMPRESSION: Low lung volumes.  No acute cardiopulmonary process. Electronically Signed   By: Lovey Newcomer M.D.   On: 06/26/2016 18:27   Ct Head Wo Contrast  Result Date: 06/26/2016 CLINICAL DATA:  Foot drop and fever. EXAM: CT HEAD WITHOUT CONTRAST TECHNIQUE: Contiguous axial images were obtained from the base of the skull through the  vertex without intravenous contrast. COMPARISON:  None. FINDINGS: Brain: There is no evidence for acute hemorrhage, hydrocephalus, mass lesion, or abnormal extra-axial fluid collection. No definite CT evidence for acute infarction. Diffuse loss of parenchymal volume is  consistent with atrophy. Patchy low attenuation in the deep hemispheric and periventricular white matter is nonspecific, but likely reflects chronic microvascular ischemic demyelination. 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 Vascular: No hyperdense vessel or unexpected calcification. Skull: No evidence for fracture. No worrisome lytic or sclerotic lesion. Sinuses/Orbits: The visualized paranasal sinuses and mastoid air cells are clear. Visualized portions of the globes and intraorbital fat are unremarkable. Other: None. IMPRESSION: 1. No acute intracranial abnormality 2. Atrophy with chronic small vessel white matter ischemic disease. 3. Lacunar infarct left cerebellar hemisphere. Electronically Signed   By: Misty Stanley M.D.   On: 06/26/2016 20:42    Procedures .Critical Care Performed by: Dorie Rank Authorized by: Dorie Rank   Critical care provider statement:    Critical care time (minutes):  45   Critical care was time spent personally by me on the following activities:  Discussions with consultants, evaluation of patient's response to treatment, examination of patient, ordering and performing treatments and interventions, ordering and review of laboratory studies, ordering and review of radiographic studies, pulse oximetry, re-evaluation of patient's condition, obtaining history from patient or surrogate and review of old charts   (including critical care time)  Medications Ordered in ED Medications  0.9 %  sodium chloride infusion (1,000 mLs Intravenous New Bag/Given 06/26/16 1934)  vancomycin (VANCOCIN) 1,500 mg in sodium chloride 0.9 % 500 mL IVPB (1,500 mg Intravenous New Bag/Given 06/26/16 2036)    Followed by  vancomycin (VANCOCIN) IVPB 1000 mg/200 mL premix (not administered)  piperacillin-tazobactam (ZOSYN) IVPB 3.375 g (0 g Intravenous Stopped 06/26/16 2031)     Initial Impression / Assessment and Plan / ED Course  I have reviewed the triage vital signs and the nursing  notes.  Pertinent labs & imaging results that were available during my care of the patient were reviewed by me and considered in my medical decision making (see chart for details).  Clinical Course as of Jun 27 2106  Tue Jun 26, 2016  2054 Neutrophil count is pending.  WBC is 1.9.    [JK]  2054 Hgb is down from 13 days ago.  Pt was started on empiric abx in the ED.  Sx concerning for neutropenic fever.  No signs of sepsis at this time.  [JK]    Clinical Course User Index [JK] Dorie Rank, MD    Patient presents to the emergency room with fever associated with chemotherapy and radiation treatments. His white blood cell count is decreased.   Total white blood cell count is 1.9. His absolute neutrophil percentage at 51.  ANC is less than 1000.  Empiric and a monitor started.  No signs of sepsis at this time. I will consult the medical service for admission and further treatment.  Final Clinical Impressions(s) / ED Diagnoses   Final diagnoses:  Neutropenic fever ([place])      Dorie Rank, MD 06/26/16 2110

## 2016-06-27 ENCOUNTER — Ambulatory Visit: Payer: PPO | Admitting: Hematology and Oncology

## 2016-06-27 ENCOUNTER — Ambulatory Visit
Admission: RE | Admit: 2016-06-27 | Discharge: 2016-06-27 | Disposition: A | Discharge status: Home / Self Care | Payer: PPO | Source: Ambulatory Visit | Attending: Radiation Oncology | Admitting: Radiation Oncology

## 2016-06-27 ENCOUNTER — Other Ambulatory Visit: Payer: PPO

## 2016-06-27 DIAGNOSIS — C060 Malignant neoplasm of cheek mucosa: Secondary | ICD-10-CM | POA: Diagnosis not present

## 2016-06-27 DIAGNOSIS — D61818 Other pancytopenia: Secondary | ICD-10-CM | POA: Diagnosis not present

## 2016-06-27 DIAGNOSIS — D519 Vitamin B12 deficiency anemia, unspecified: Secondary | ICD-10-CM | POA: Diagnosis not present

## 2016-06-27 DIAGNOSIS — I2510 Atherosclerotic heart disease of native coronary artery without angina pectoris: Secondary | ICD-10-CM | POA: Diagnosis not present

## 2016-06-27 DIAGNOSIS — K5900 Constipation, unspecified: Secondary | ICD-10-CM | POA: Diagnosis not present

## 2016-06-27 DIAGNOSIS — R5081 Fever presenting with conditions classified elsewhere: Secondary | ICD-10-CM | POA: Diagnosis not present

## 2016-06-27 DIAGNOSIS — C069 Malignant neoplasm of mouth, unspecified: Secondary | ICD-10-CM | POA: Diagnosis not present

## 2016-06-27 DIAGNOSIS — Z79899 Other long term (current) drug therapy: Secondary | ICD-10-CM | POA: Diagnosis not present

## 2016-06-27 DIAGNOSIS — K1231 Oral mucositis (ulcerative) due to antineoplastic therapy: Secondary | ICD-10-CM

## 2016-06-27 DIAGNOSIS — I69991 Dysphagia following unspecified cerebrovascular disease: Secondary | ICD-10-CM | POA: Diagnosis not present

## 2016-06-27 DIAGNOSIS — Z87891 Personal history of nicotine dependence: Secondary | ICD-10-CM | POA: Diagnosis not present

## 2016-06-27 DIAGNOSIS — Z7984 Long term (current) use of oral hypoglycemic drugs: Secondary | ICD-10-CM | POA: Diagnosis not present

## 2016-06-27 DIAGNOSIS — Z951 Presence of aortocoronary bypass graft: Secondary | ICD-10-CM | POA: Diagnosis not present

## 2016-06-27 DIAGNOSIS — G8929 Other chronic pain: Secondary | ICD-10-CM | POA: Diagnosis not present

## 2016-06-27 DIAGNOSIS — E119 Type 2 diabetes mellitus without complications: Secondary | ICD-10-CM | POA: Diagnosis not present

## 2016-06-27 DIAGNOSIS — K219 Gastro-esophageal reflux disease without esophagitis: Secondary | ICD-10-CM | POA: Diagnosis not present

## 2016-06-27 DIAGNOSIS — K5010 Crohn's disease of large intestine without complications: Secondary | ICD-10-CM | POA: Diagnosis not present

## 2016-06-27 DIAGNOSIS — K1230 Oral mucositis (ulcerative), unspecified: Secondary | ICD-10-CM

## 2016-06-27 DIAGNOSIS — T451X5A Adverse effect of antineoplastic and immunosuppressive drugs, initial encounter: Secondary | ICD-10-CM | POA: Diagnosis not present

## 2016-06-27 DIAGNOSIS — Z8261 Family history of arthritis: Secondary | ICD-10-CM | POA: Diagnosis not present

## 2016-06-27 DIAGNOSIS — Z931 Gastrostomy status: Secondary | ICD-10-CM | POA: Diagnosis not present

## 2016-06-27 DIAGNOSIS — E118 Type 2 diabetes mellitus with unspecified complications: Secondary | ICD-10-CM | POA: Diagnosis not present

## 2016-06-27 DIAGNOSIS — D709 Neutropenia, unspecified: Secondary | ICD-10-CM | POA: Diagnosis not present

## 2016-06-27 DIAGNOSIS — Z8249 Family history of ischemic heart disease and other diseases of the circulatory system: Secondary | ICD-10-CM | POA: Diagnosis not present

## 2016-06-27 DIAGNOSIS — K5090 Crohn's disease, unspecified, without complications: Secondary | ICD-10-CM | POA: Diagnosis not present

## 2016-06-27 DIAGNOSIS — L98499 Non-pressure chronic ulcer of skin of other sites with unspecified severity: Secondary | ICD-10-CM | POA: Diagnosis not present

## 2016-06-27 DIAGNOSIS — E042 Nontoxic multinodular goiter: Secondary | ICD-10-CM | POA: Diagnosis not present

## 2016-06-27 DIAGNOSIS — E46 Unspecified protein-calorie malnutrition: Secondary | ICD-10-CM

## 2016-06-27 DIAGNOSIS — Z87442 Personal history of urinary calculi: Secondary | ICD-10-CM | POA: Diagnosis not present

## 2016-06-27 DIAGNOSIS — I10 Essential (primary) hypertension: Secondary | ICD-10-CM | POA: Diagnosis not present

## 2016-06-27 DIAGNOSIS — Z66 Do not resuscitate: Secondary | ICD-10-CM | POA: Diagnosis not present

## 2016-06-27 DIAGNOSIS — K5190 Ulcerative colitis, unspecified, without complications: Secondary | ICD-10-CM | POA: Diagnosis not present

## 2016-06-27 DIAGNOSIS — Y842 Radiological procedure and radiotherapy as the cause of abnormal reaction of the patient, or of later complication, without mention of misadventure at the time of the procedure: Secondary | ICD-10-CM | POA: Diagnosis not present

## 2016-06-27 DIAGNOSIS — E43 Unspecified severe protein-calorie malnutrition: Secondary | ICD-10-CM | POA: Insufficient documentation

## 2016-06-27 DIAGNOSIS — R011 Cardiac murmur, unspecified: Secondary | ICD-10-CM | POA: Diagnosis not present

## 2016-06-27 DIAGNOSIS — Z7982 Long term (current) use of aspirin: Secondary | ICD-10-CM | POA: Diagnosis not present

## 2016-06-27 DIAGNOSIS — Z6826 Body mass index (BMI) 26.0-26.9, adult: Secondary | ICD-10-CM | POA: Diagnosis not present

## 2016-06-27 DIAGNOSIS — R42 Dizziness and giddiness: Secondary | ICD-10-CM | POA: Diagnosis not present

## 2016-06-27 DIAGNOSIS — R509 Fever, unspecified: Secondary | ICD-10-CM | POA: Diagnosis not present

## 2016-06-27 DIAGNOSIS — E785 Hyperlipidemia, unspecified: Secondary | ICD-10-CM | POA: Diagnosis not present

## 2016-06-27 DIAGNOSIS — K760 Fatty (change of) liver, not elsewhere classified: Secondary | ICD-10-CM | POA: Diagnosis not present

## 2016-06-27 DIAGNOSIS — Z510 Encounter for antineoplastic radiation therapy: Secondary | ICD-10-CM | POA: Diagnosis not present

## 2016-06-27 DIAGNOSIS — Z85828 Personal history of other malignant neoplasm of skin: Secondary | ICD-10-CM | POA: Diagnosis not present

## 2016-06-27 LAB — COMPREHENSIVE METABOLIC PANEL
ALBUMIN: 2.7 g/dL — AB (ref 3.5–5.0)
ALK PHOS: 44 U/L (ref 38–126)
ALT: 10 U/L — ABNORMAL LOW (ref 17–63)
AST: 22 U/L (ref 15–41)
Anion gap: 4 — ABNORMAL LOW (ref 5–15)
BILIRUBIN TOTAL: 0.3 mg/dL (ref 0.3–1.2)
BUN: 25 mg/dL — AB (ref 6–20)
CO2: 28 mmol/L (ref 22–32)
CREATININE: 0.83 mg/dL (ref 0.61–1.24)
Calcium: 8.1 mg/dL — ABNORMAL LOW (ref 8.9–10.3)
Chloride: 101 mmol/L (ref 101–111)
GFR calc Af Amer: >60 mL/min (ref >60)
GLUCOSE: 292 mg/dL — AB (ref 65–99)
Potassium: 4.3 mmol/L (ref 3.5–5.1)
Sodium: 133 mmol/L — ABNORMAL LOW (ref 135–145)
TOTAL PROTEIN: 5.6 g/dL — AB (ref 6.5–8.1)

## 2016-06-27 LAB — CBC WITH DIFFERENTIAL/PLATELET
BASOS ABS: 0 10*3/uL (ref 0.0–0.1)
Basophils Relative: 0 %
Eosinophils Absolute: 0.1 10*3/uL (ref 0.0–0.7)
Eosinophils Relative: 6 %
HEMATOCRIT: 22.3 % — AB (ref 39.0–52.0)
Hemoglobin: 7.8 g/dL — ABNORMAL LOW (ref 13.0–17.0)
LYMPHS ABS: 0.3 10*3/uL — AB (ref 0.7–4.0)
Lymphocytes Relative: 23 %
MCH: 35.6 pg — ABNORMAL HIGH (ref 26.0–34.0)
MCHC: 35 g/dL (ref 30.0–36.0)
MCV: 101.8 fL — AB (ref 78.0–100.0)
MONO ABS: 0.3 10*3/uL (ref 0.1–1.0)
MONOS PCT: 19 %
NEUTROS ABS: 0.7 10*3/uL — AB (ref 1.7–7.7)
Neutrophils Relative %: 52 %
PLATELETS: 140 10*3/uL — AB (ref 150–400)
RBC: 2.19 MIL/uL — AB (ref 4.22–5.81)
RDW: 14.4 % (ref 11.5–15.5)
WBC: 1.4 10*3/uL — AB (ref 4.0–10.5)

## 2016-06-27 LAB — GLUCOSE, CAPILLARY
GLUCOSE-CAPILLARY: 169 mg/dL — AB (ref 65–99)
GLUCOSE-CAPILLARY: 198 mg/dL — AB (ref 65–99)
Glucose-Capillary: 299 mg/dL — ABNORMAL HIGH (ref 65–99)
Glucose-Capillary: 308 mg/dL — ABNORMAL HIGH (ref 65–99)

## 2016-06-27 MED ORDER — SODIUM CHLORIDE 0.9 % IV SOLN
1000.0000 mL | INTRAVENOUS | Status: DC
  Administered 2016-06-28 (×2): 1000 mL via INTRAVENOUS

## 2016-06-27 MED ORDER — MAGIC MOUTHWASH W/LIDOCAINE
10.0000 mL | Freq: Four times a day (QID) | ORAL | Status: DC | PRN
  Administered 2016-06-27: 5 mL via ORAL
  Filled 2016-06-27: qty 10

## 2016-06-27 MED ORDER — ORAL CARE MOUTH RINSE: 15.0000 mL | Freq: Two times a day (BID) | OROMUCOSAL | Status: DC

## 2016-06-27 MED ORDER — MORPHINE SULFATE (CONCENTRATE) 10 MG/0.5ML PO SOLN
10.0000 mg | ORAL | Status: DC | PRN
  Administered 2016-06-27 – 2016-06-28 (×3): 10 mg
  Filled 2016-06-27 (×3): qty 0.5

## 2016-06-27 MED ORDER — SILVER SULFADIAZINE 1 % EX CREA
TOPICAL_CREAM | Freq: Two times a day (BID) | CUTANEOUS | Status: DC
  Administered 2016-06-27: 12:00:00 via TOPICAL

## 2016-06-27 MED ORDER — INSULIN ASPART 100 UNIT/ML ~~LOC~~ SOLN
0.0000 [IU] | Freq: Three times a day (TID) | SUBCUTANEOUS | Status: DC
  Administered 2016-06-27 (×2): 2 [IU] via SUBCUTANEOUS
  Administered 2016-06-27: 5 [IU] via SUBCUTANEOUS

## 2016-06-27 MED ORDER — INSULIN ASPART 100 UNIT/ML ~~LOC~~ SOLN: 0.0000 [IU] | SUBCUTANEOUS | Status: DC

## 2016-06-27 MED ORDER — CHLORHEXIDINE GLUCONATE 0.12 % MT SOLN
15.0000 mL | Freq: Two times a day (BID) | OROMUCOSAL | Status: DC
  Administered 2016-06-28: 15 mL via OROMUCOSAL
  Filled 2016-06-27: qty 15

## 2016-06-27 MED ORDER — FENTANYL 12 MCG/HR TD PT72
12.5000 ug | MEDICATED_PATCH | TRANSDERMAL | Status: DC
  Administered 2016-06-27: 12.5 ug via TRANSDERMAL
  Filled 2016-06-27: qty 1

## 2016-06-27 MED ORDER — TBO-FILGRASTIM 300 MCG/0.5ML ~~LOC~~ SOSY
300.0000 ug | PREFILLED_SYRINGE | Freq: Every day | SUBCUTANEOUS | Status: DC
  Administered 2016-06-27 – 2016-06-28 (×2): 300 ug via SUBCUTANEOUS
  Filled 2016-06-27 (×3): qty 0.5

## 2016-06-27 MED ORDER — SODIUM CHLORIDE 0.9% FLUSH: 10.0000 mL | INTRAVENOUS | Status: DC | PRN

## 2016-06-27 MED ORDER — INSULIN ASPART 100 UNIT/ML ~~LOC~~ SOLN
0.0000 [IU] | SUBCUTANEOUS | Status: DC
  Administered 2016-06-27 – 2016-06-28 (×2): 7 [IU] via SUBCUTANEOUS
  Administered 2016-06-28: 1 [IU] via SUBCUTANEOUS
  Administered 2016-06-28: 7 [IU] via SUBCUTANEOUS
  Administered 2016-06-28 (×2): 5 [IU] via SUBCUTANEOUS
  Administered 2016-06-29 (×2): 1 [IU] via SUBCUTANEOUS
  Administered 2016-06-29: 5 [IU] via SUBCUTANEOUS

## 2016-06-27 MED ORDER — OSMOLITE 1.5 CAL PO LIQD
355.0000 mL | ORAL | Status: DC
  Administered 2016-06-27 – 2016-06-29 (×9): 355 mL
  Filled 2016-06-27 (×12): qty 474

## 2016-06-27 MED ORDER — PRO-STAT SUGAR FREE PO LIQD
30.0000 mL | Freq: Two times a day (BID) | ORAL | Status: DC
  Administered 2016-06-27 – 2016-06-29 (×5): 30 mL
  Filled 2016-06-27 (×5): qty 30

## 2016-06-27 NOTE — Progress Notes (Addendum)
Initial Nutrition Assessment  DOCUMENTATION CODES:   Severe malnutrition in context of chronic illness  INTERVENTION:   Osmolite 1.5- home TF regimen 1.5 cans q 6hrs  Prostat liquid protein 30 ml BID via tube, each supplement provides 100 kcal, 15 grams protein.  Give 24m free water before and after tube feeding- home regimen  Give 2465mfree water via tube three times a day in between feedings.- home regimen  Regimen provides 2330kcal/day, 119g/day protein, 228647mree water  NUTRITION DIAGNOSIS:   Malnutrition (severe) related to cancer and cancer related treatments as evidenced by moderate depletion of body fat, moderate depletions of muscle mass, 10 percent weight loss in 4 months.  GOAL:   Patient will meet greater than or equal to 90% of their needs  MONITOR:   Labs, Weight trends, TF tolerance  REASON FOR ASSESSMENT:   Malnutrition Screening Tool    ASSESSMENT:   75 7o. 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 Patient states over the last 3-4 months patient has been feeling weak on the right foot area. Had gone to visit his PCP for this and was found to be febrile and since PCP was concerned for neutropenic fever patient was referred to the ER.    Met with pt and wife in room today. Pt s/p PEG placement in May 2018. Pt had previously lost 20lbs(10%) of his weight since Feb 2018 but has been weight stable for the past month. Pt does not take anything by mouth at this time as he reports that he feels like food gets stuck in his throat and makes him choke. Pt was previously drinking Boost milkshakes at home. Pt is being followed by [facility] at the cancer center. Pt's last chemo was 2 weeks ago; pt had radiation treatment today. Pt with history of ulcerative colitis; pt reports intermittent  constipation/diarrhea but reports that this has been stable for years.   Medications reviewed and include: aspirin, lovenox, fentanyl, insulin, miralax, zosyn, vancomycin  Labs reviewed: Na 133(L), BUN 25(H), Ca 8.1(L) adj. 9.14 wnl, alb 2.7(L) Wbc- 1.4(L), Hgb 7.8(L), Hct 22.3(L) cbgs- 186, 292 x 24 hrs  Nutrition-Focused physical exam completed. Findings are moderate fat depletion in arms and chest, moderate muscle depletion in temporal regions, clavicles, and moderate edema in BLE.   Diet Order:   NPO  Skin:  Reviewed, no issues  Last BM:  6/27  Height:   Ht Readings from Last 1 Encounters:  06/26/16 5' 8"  (1.727 m)    Weight:   Wt Readings from Last 1 Encounters:  06/26/16 181 lb 14.1 oz (82.5 kg)    Ideal Body Weight:  70 kg  BMI:  Body mass index is 27.65 kg/m.  Estimated Nutritional Needs:   Kcal:  2350-2500kcal/day   Protein:  107-123g/day   Fluid:  >2L/day   EDUCATION NEEDS:   Education needs no appropriate at this time  CasKoleen Distance, RD, LDNWillger #- [phone] Hours Pager: [phone]

## 2016-06-27 NOTE — Progress Notes (Signed)
Andres Smith was provided silvadene cream today. I applied a test dose to his left forearm, and instructed him to watch for any redness or allergic reaction to this area. If none noted he can begin using silvadene tomorrow morning to his Left neck radiation site. It needs to be applied twice daily and any residue removed before reapplying. Mr. Edgett voiced his understanding. I also spoke with his wife [name] over the phone and repeated the same instructions to her and she verbalized her understanding. I also spoke with Mr. [place] nurse Janett Billow on [address]. She is aware of the above. They know to call me if they have any questions or concerns.

## 2016-06-27 NOTE — Progress Notes (Signed)
PROGRESS NOTE   Andres Smith  RFF:[id number]    DOB: 1940-11-08    DOA: 06/26/2016  PCP: Janith Lima, MD   I have briefly reviewed patients previous medical records in [facility].  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 In ED, pancytopenia, fever of 101.15F, admitted for evaluation and management of febrile neutropenia.   Assessment & Plan:   Principal Problem:   Fever Active Problems:   Type II diabetes mellitus with manifestations (HCC)   Essential hypertension, benign   Coronary atherosclerosis of native coronary artery   Ulcerative colitis (HCC)   B12 deficiency anemia   Mucositis due to chemotherapy   Acquired pancytopenia (HCC)   Protein-calorie malnutrition, severe   1. Febrile neutropenia: Source of fever is unclear but could be from mucosa/skin (oral mucositis/neck radiation site cellulitis). Neutropenic precautions. Continue empiric IV vancomycin and Zosyn pending culture results. Chest x-ray and urine microscopy not indicative of infection. 2. Pancytopenia: Oncology input appreciated and started him on G-CSF until [place] is greater than 1.5. Secondary to chemoradiation. As per oncology, a further drop in hemoglobin compared to today then for blood transfusion. 3. CAD status post CABG: Recently ran out of Imdur. No chest pain reported. Continue statins and aspirin. 4. Ulcerative colitis: No signs of acute exacerbation 5. PEG tube feeds: Continue. 6. DM 2: Follow CBGs and SSI. Patient stated that he has stopped taking metformin for now. 7. Right lower extremity weakness: CT head unremarkable. May consider MRI. PT evaluation. No focal deficits noted. 8. Buccal mucosa squamous cell carcinoma: Oncology input appreciated.  No further chemotherapy at this point. Oncology recommends holding radiation treatment if ANC is less than 1.5. 9. Mucositis: Management per oncology. 10. Protein calorie malnutrition: Tube feeds.    DVT prophylaxis: Lovenox Code Status: DO NOT RESUSCITATE Family Communication: Discussed in detail with spouse at bedside. Disposition: To be determined.   Consultants:  Oncology   Procedures:  None  Antimicrobials:  IV vancomycin and Zosyn.    Subjective: Feels slightly better. Fever down. Mouth sore. Just returned from radiation treatment this morning. Having chronic loose stools for a couple of weeks-not change.   ROS: No bleeding reported. No dizziness, lightheadedness, chest pain or dyspnea.  Objective:  Vitals:   06/26/16 2245 06/26/16 2344 06/27/16 0437 06/27/16 1419  BP: 130/64 128/65 130/65 (!) 110/42  Pulse: 96 93 92 71  Resp: 19 16 16    Temp:  98.5 F (36.9 C) (!) 100.4 F (38 C) 98.8 F (37.1 C)  TempSrc:  Oral Oral Oral  SpO2: 92% 95% 94% 95%  Weight:  82.5 kg (181 lb 14.1 oz)    Height:  5\' 8"  (1.727 m)      Examination:  General exam: Pleasant elderly male, chronically ill-looking lying comfortably in bed Oral cavity: Tongue dry and significant mucositis but no thrush. Neck: Radiation skin injury around neck, most prominent on left side of neck with some erythema and oozing. Respiratory system: Clear to auscultation. Respiratory effort normal. Cardiovascular system: S1 & S2 heard, RRR. No JVD, murmurs, rubs, gallops or clicks. No pedal edema. Telemetry: Sinus rhythm with first-degree AV block. Gastrointestinal system: Abdomen is nondistended, soft and nontender. No organomegaly or masses felt. Normal bowel sounds heard. Central nervous system:  Alert and oriented. No focal neurological deficits. Extremities: Symmetric 5 x 5 power. Skin: No rashes, lesions or ulcers Psychiatry: Judgement and insight appear normal. Mood & affect appropriate.     Data  Reviewed: I have personally reviewed following labs and imaging studies  CBC:  Recent Labs Lab 06/26/16 1923 06/27/16 0425  WBC 1.9* 1.4*  NEUTROABS 1.0* 0.7*  HGB 8.5* 7.8*  HCT 24.3* 22.3*  MCV 99.6 101.8*  PLT 138* 366*   Basic Metabolic Panel:  Recent Labs Lab 06/26/16 1923 06/27/16 0425  NA 132* 133*  K 4.4 4.3  CL 97* 101  CO2 26 28  GLUCOSE 186* 292*  BUN 29* 25*  CREATININE 0.85 0.83  CALCIUM 8.5* 8.1*   Liver Function Tests:  Recent Labs Lab 06/26/16 1923 06/27/16 0425  AST 17 22  ALT 11* 10*  ALKPHOS 46 44  BILITOT 0.2* 0.3  PROT 6.4* 5.6*  ALBUMIN 3.2* 2.7*   CBG:  Recent Labs Lab 06/27/16 0738 06/27/16 1209  GLUCAP 169* 299*    Recent Results (from the past 240 hour(s))  Blood Culture (routine x 2)     Status: None (Preliminary result)   Collection Time: 06/26/16  7:23 PM  Result Value Ref Range Status   Specimen Description BLOOD PORTA CATH  Final   Special Requests   Final    BOTTLES DRAWN AEROBIC AND ANAEROBIC Blood Culture adequate volume   Culture   Final    NO GROWTH < 24 HOURS Performed at [facility], [place] [address], [place], [place] [address]    Report Status PENDING  Incomplete  Blood Culture (routine x 2)     Status: None (Preliminary result)   Collection Time: 06/26/16  7:24 PM  Result Value Ref Range Status   Specimen Description BLOOD LEFT ANTECUBITAL  Final   Special Requests   Final    BOTTLES DRAWN AEROBIC AND ANAEROBIC Blood Culture adequate volume   Culture   Final    NO GROWTH < 24 HOURS Performed at [facility], [place] [address], [place], [place] [address]    Report Status PENDING  Incomplete         Radiology Studies: Dg Chest 2 View  Result Date: 06/26/2016 CLINICAL DATA:  Patient with abnormal vital signs. Recent chemotherapy. EXAM: CHEST  2 VIEW COMPARISON:  Chest radiograph 05/22/2016. FINDINGS: Right anterior chest wall Port-A-Cath is present with tip projecting over the superior  vena cava. Patient status post median sternotomy. Stable cardiac and mediastinal contours. Low lung volumes. No large area pulmonary consolidation. No pleural effusion or pneumothorax. Thoracic spine degenerative changes. IMPRESSION: Low lung volumes.  No acute cardiopulmonary process. Electronically Signed   By: Lovey Newcomer M.D.   On: 06/26/2016 18:27   Ct Head Wo Contrast  Result Date: 06/26/2016 CLINICAL DATA:  Foot drop and fever. EXAM: CT HEAD WITHOUT CONTRAST TECHNIQUE: Contiguous axial images were obtained from the base of the skull through the vertex without intravenous contrast. COMPARISON:  None. FINDINGS: Brain: There is no evidence for acute hemorrhage, hydrocephalus, mass lesion, or abnormal extra-axial fluid collection. No definite CT evidence for acute infarction. Diffuse loss of parenchymal volume is consistent with atrophy. Patchy low attenuation in the deep hemispheric and periventricular white matter is nonspecific, but likely reflects chronic microvascular ischemic demyelination. 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 Vascular: No hyperdense vessel or unexpected calcification. Skull: No evidence for fracture. No worrisome lytic or sclerotic lesion. Sinuses/Orbits: The visualized paranasal sinuses and mastoid  air cells are clear. Visualized portions of the globes and intraorbital fat are unremarkable. Other: None. IMPRESSION: 1. No acute intracranial abnormality 2. Atrophy with chronic small vessel white matter ischemic disease. 3. Lacunar infarct left cerebellar hemisphere. Electronically Signed   By: Misty Stanley M.D.   On: 06/26/2016 20:42        Scheduled Meds: . aspirin EC  81 mg Oral Daily  . atorvastatin  80 mg Oral QHS  . balsalazide  2,250 mg Oral TID  . chlorhexidine  15 mL Mouth Rinse BID  . enoxaparin (LOVENOX) injection  40 mg Subcutaneous Q24H  . feeding supplement (OSMOLITE 1.5 CAL)  355 mL Per Tube 4 times per day  . feeding supplement (PRO-STAT SUGAR FREE  64)  30 mL Per Tube BID  . fentaNYL  12.5 mcg Transdermal Q72H  . insulin aspart  0-9 Units Subcutaneous TID WC  . isosorbide mononitrate  90 mg Oral Daily  . mouth rinse  15 mL Mouth Rinse q12n4p  . polyethylene glycol  17 g Per Tube Daily  . Tbo-filgastrim (GRANIX) SQ  300 mcg Subcutaneous Daily   Continuous Infusions: . sodium chloride 1,000 mL (06/26/16 1934)  . piperacillin-tazobactam (ZOSYN)  IV Stopped (06/27/16 1612)  . vancomycin Stopped (06/27/16 1421)     LOS: 0 days     Dearra Myhand, MD, FACP, FHM. [facility] Pager [phone]  If 7PM-7AM, please contact night-coverage [email or web address] Password [facility] 06/27/2016, 5:13 PM

## 2016-06-27 NOTE — Progress Notes (Signed)
CRITICAL VALUE ALERT  Critical Value:  WBC 1.4  Date & Time Notied:  06/28/2106 0600  Provider Notified: Lamar Blinks

## 2016-06-27 NOTE — Progress Notes (Signed)
Andres Smith   DOB:03/24/1940   ZT#:[id number]   Patient is well-known to me.  He is supposed to be seen in the outpatient clinic today but was admitted due to neutropenic fever.   Buccal mucosa squamous cell carcinoma ([place])   03/02/2016 Pathology Results    Invasive 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 Tumor measures 0.9 cm in greatest dimension. Tumor extends to the inked deep tissue edge. Depth of invasion: 4 mm (in this material) See note.  Note: an immunostains for P16 was performed at an outside hospital and provided for [place] to review; p16 immunostain is negative. Per outside report, In situ hybridization for high-risk HPV types showed no evidence of transcriptionally active high-risk HPV types.      03/21/2016 Initial Diagnosis    Salient findings:  -EAC's clear -Lesion within left buccal mucosa (pictured below) with about 32mm depth. Width is about 2/3 of the left buccal mucosal surface area by palpation. Extends to the oral commissure.  -Tongue soft and mobile without mucosal lesions -Neck soft with no palpable neck masses -Right handed -Right hand is pink and warm with Allen's test negative (normal)  -Left forearm very sun damaged with multiple treated areas from precancerous or cancerous areas. Right forearm not as dramatic, presumably from sun exposure while driving      3/82/5053 Surgery    RESECTION OF ORAL BUCCAL MUCOSA SELECTIVE NECK DISSECTION FREE FLAP RADIAL FOREARM PR SPLIT [place] <100 SQCM [15100] (SPLIT THICKNESS SKIN GRAFT LEG        04/13/2016 Pathology Results    A.LEFT ORAL COMMISSURE INFERIOR, EXCISION: No malignancy identified.  B.LEFT ORAL COMMISSURE SUPERIOR, EXCISION: No malignancy identified.  C.LEFT DEEP ORAL COMMISSURE, EXCISION: No malignancy identified.  D.RIGHT POSTERIOR BUCCAL INFERIOR, EXCISION: No malignancy identified.  E.RIGHT POSTERIOR BUCCAL  SUPERIOR, EXCISION: No malignancy identified.  F.ORAL CAVITY, LEFT BUCCAL, EXCISION:  Invasive squamous cell carcinoma, moderately differentiated,  keratinizing. Tumor size:2.2 cm. Tumor depth of invasion:0.8 cm. Perineural invasion identified. Margins negative for malignancy. Pathologic stage:pT2 pN2a. See tumor protocol summary below.  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 Tumor deposit size:1.1 cm. Extranodal extension present. Benign salivary gland tissue.  SURGICAL PATHOLOGY CANCER CASE SUMMARY (AJCC 8TH EDITION) LIP AND ORAL CAVITY CAP Protocol posting date: June, 2017 Version: LipOralCavity 4.0.0.0  PROCEDURE: Wide resection of buccal mucosa TUMOR SITE:Oral:Buccal mucosa TUMOR LATERALITY: Left TUMOR FOCALITY: Unifocal TUMOR SIZE:Greatest dimension: 2.2 cm TUMOR DEPTH OF INVASION (DOI):8 mm HISTOLOGIC TYPE:Squamous cell carcinoma, conventional HISTOLOGIC GRADE:G2: Moderately differentiated SPECIMEN MARGINS:Uninvolved by invasive carcinoma  DISTANCE FROM CLOSEST MARGIN:1 mm  SPECIFY MARGIN(S):Deep LYMPHOVASCULAR INVASION:Present PERINEURAL INVASION: Present REGIONAL LYMPH NODES: NUMBER OF LYMPH NODES INVOLVED: 1 NUMBER OF LYMPH NODES EXAMINED: 4 LATERALITY OF LYMPH NODES INVOLVED: Ipsilateral SIZE OF LARGEST METASTATIC DEPOSIT:1.1 cm EXTRANODAL EXTENSION:Present PATHOLOGIC STAGE CLASSIFICATION (pTNM, AJCC 8th Ed): pT2 pN2a pT2: umor >2 cm but <=4 cm and <=10 mm DOI pN2a:Metastasis in a single ipsilateral lymph node, 3 cm or  smaller in greatest dimension and ENE(+) BIOMARKERS:performed on prior outside biopsy [facility] review case [phone], outside case [id number]) P16 (Immunohistochemistry):Negative HPV (high-risk types by in situ hybridization):Reported as  negative      05/11/2016 Imaging    1. 4 mm left lower lobe pulmonary nodule. Attention on follow-up imaging recommended as metastatic disease not excluded. 2. 2.8 cm left thyroid nodule. Thyroid ultrasound recommended to further evaluate. 3. Bilateral renal cysts. 4. Coronary artery and thoracoabdominal aortic atherosclerosis. 5. Ankylosing spondylitis.  05/11/2016 Imaging    CT neck: Surgical clips in the region of the left buccal mucosa, presumably at the previous primary site. Mild scarring in that region measuring about 8 mm. Cannot assess for residual or recurrent disease in that location, but this may simply be scarring. Previous left submandibular resection and left neck node dissection. No abnormal nodes presently. Multinodular goiter. Ankylosing spondylitis.  C1-2 remains a mobile segment.      05/21/2016 Procedure    1. Successful placement of a right internal jugular approach power injectable Port-A-Cath. The Port a catheter is ready for immediate use. 2. Successful fluoroscopic insertion of a 20-French pull-through gastrostomy tube. The gastrostomy may be used immediately for medication administration and may be utilized in 24 hrs for the initiation of feeds.      05/23/2016 -  Radiation Therapy    He received radiation treatment       05/25/2016 -  Chemotherapy    He received weekly reduced dose cisplatin       06/21/2016 Adverse Reaction    Cycle 5 is delayed due to pancytopenia      Subjective: The patient developed fever and chills 2 days ago.  He is noted to have significant mucositis and skin ulceration from radiation induced skin injury.  He presented yesterday for echocardiogram and was evaluated in the outpatient clinic and with high fever was admitted for management of neutropenic fever. He was started on antibiotic treatment. He complained of pain but is refusing to start fentanyl patch He had good results with Kayexalate last week for  hyperkalemia. He denies nausea or vomiting.  Objective:  Vitals:   06/26/16 2344 06/27/16 0437  BP: 128/65 130/65  Pulse: 93 92  Resp: 16 16  Temp: 98.5 F (36.9 C) (!) 100.4 F (38 C)     Intake/Output Summary (Last 24 hours) at 06/27/16 1317 Last data filed at 06/27/16 1227  Gross per 24 hour  Intake          1799.17 ml  Output                0 ml  Net          1799.17 ml    GENERAL:alert, no distress and comfortable SKIN: He has significant skin ulceration on the left side of his neck EYES: normal, Conjunctiva are pink and non-injected, sclera clear OROPHARYNX: Noticed significant mucositis.  No thrush NECK: Difficult to examine due to significant skin injury LYMPH:  no palpable lymphadenopathy in the cervical, axillary or inguinal LUNGS: clear to auscultation and percussion with normal breathing effort HEART: regular rate & rhythm and no murmurs and no lower extremity edema ABDOMEN:abdomen soft, non-tender and normal bowel sounds Musculoskeletal:no cyanosis of digits and no clubbing  NEURO: alert & oriented x 3 with fluent speech, no focal motor/sensory deficits   Labs:  Lab Results  Component Value Date   WBC 1.4 (LL) 06/27/2016   HGB 7.8 (L) 06/27/2016   HCT 22.3 (L) 06/27/2016   MCV 101.8 (H) 06/27/2016   PLT 140 (L) 06/27/2016   NEUTROABS 0.7 (L) 06/27/2016    Lab Results  Component Value Date   NA 133 (L) 06/27/2016   K 4.3 06/27/2016   CL 101 06/27/2016   CO2 28 06/27/2016    Studies:  Dg Chest 2 View  Result Date: 06/26/2016 CLINICAL DATA:  Patient with abnormal vital signs. Recent chemotherapy. EXAM: CHEST  2 VIEW COMPARISON:  Chest radiograph 05/22/2016. FINDINGS: Right anterior chest wall Port-A-Cath  is present with tip projecting over the superior vena cava. Patient status post median sternotomy. Stable cardiac and mediastinal contours. Low lung volumes. No large area pulmonary consolidation. No pleural effusion or pneumothorax. Thoracic spine  degenerative changes. IMPRESSION: Low lung volumes.  No acute cardiopulmonary process. Electronically Signed   By: Lovey Newcomer M.D.   On: 06/26/2016 18:27   Ct Head Wo Contrast  Result Date: 06/26/2016 CLINICAL DATA:  Foot drop and fever. EXAM: CT HEAD WITHOUT CONTRAST TECHNIQUE: Contiguous axial images were obtained from the base of the skull through the vertex without intravenous contrast. COMPARISON:  None. FINDINGS: Brain: There is no evidence for acute hemorrhage, hydrocephalus, mass lesion, or abnormal extra-axial fluid collection. No definite CT evidence for acute infarction. Diffuse loss of parenchymal volume is consistent with atrophy. Patchy low attenuation in the deep hemispheric and periventricular white matter is nonspecific, but likely reflects chronic microvascular ischemic demyelination. 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 Vascular: No hyperdense vessel or unexpected calcification. Skull: No evidence for fracture. No worrisome lytic or sclerotic lesion. Sinuses/Orbits: The visualized paranasal sinuses and mastoid air cells are clear. Visualized portions of the globes and intraorbital fat are unremarkable. Other: None. IMPRESSION: 1. No acute intracranial abnormality 2. Atrophy with chronic small vessel white matter ischemic disease. 3. Lacunar infarct left cerebellar hemisphere. Electronically Signed   By: Misty Stanley M.D.   On: 06/26/2016 20:42    Assessment & Plan:   Buccal mucosa squamous cell carcinoma ([place]) I will not proceed with further chemotherapy. Continue supportive care  Neutropenic fever He is started on broad-spectrum intravenous antibiotics The source is likely from the head and neck region from mucositis and skin ulceration If cultures remain negative, I recommend discontinuation of vancomycin after 48 hours of coverage I will start him on G-CSF support until [place] is greater than 1.5 He should hold radiation treatment if ANC is less than 1.5  Acquired  pancytopenia ([place]) This is due to side effects of treatment He is not symptomatic from anemia.  However, if his hemoglobin drops further tomorrow, I recommend blood transfusion support due to prior cardiac history and slow wound healing without blood He will be started on G-CSF support until [place] is greater than 1.5 as above  Mucositis due to treatment with pain I have a long discussion with the patient and his wife regarding chronic pain management. I recommend he start using pain medicine as prescribed regularly.   I will start him on 12 mcg patch and reassess pain control tomorrow  Protein calorie malnutrition He is seen by dietitian and will resume tube feeding while hospitalized  Discharge planning He is not ready for discharge due to neutropenic fever He will likely be here the next 48-72 hours at least I will follow daily   [name], MD 06/27/2016  1:17 PM

## 2016-06-27 NOTE — Progress Notes (Signed)
[facility] Therapy Treatment Record Phone [phone]   Radiation Therapy was administered to Andres Smith on: 06/27/2016  9:29 AM and was treatment # 24 out of a planned course of 30 treatments.  Radiation Treatment  1). Beam photons with 6-10 energy  2). Brachytherapy None  3). Stereotactic Radiosurgery None  4). Other Radiation None     Gavino Fouch, Raidyn Breiner, RT (T)

## 2016-06-28 ENCOUNTER — Ambulatory Visit: Payer: PPO

## 2016-06-28 LAB — BASIC METABOLIC PANEL
ANION GAP: 6 (ref 5–15)
BUN: 21 mg/dL — ABNORMAL HIGH (ref 6–20)
CO2: 27 mmol/L (ref 22–32)
Calcium: 8 mg/dL — ABNORMAL LOW (ref 8.9–10.3)
Chloride: 105 mmol/L (ref 101–111)
Creatinine, Ser: 0.83 mg/dL (ref 0.61–1.24)
Glucose, Bld: 102 mg/dL — ABNORMAL HIGH (ref 65–99)
POTASSIUM: 3.8 mmol/L (ref 3.5–5.1)
Sodium: 138 mmol/L (ref 135–145)

## 2016-06-28 LAB — GLUCOSE, CAPILLARY
GLUCOSE-CAPILLARY: 113 mg/dL — AB (ref 65–99)
GLUCOSE-CAPILLARY: 128 mg/dL — AB (ref 65–99)
GLUCOSE-CAPILLARY: 313 mg/dL — AB (ref 65–99)
GLUCOSE-CAPILLARY: 286 mg/dL — AB (ref 65–99)
Glucose-Capillary: 286 mg/dL — ABNORMAL HIGH (ref 65–99)
Glucose-Capillary: 310 mg/dL — ABNORMAL HIGH (ref 65–99)

## 2016-06-28 LAB — CBC WITH DIFFERENTIAL/PLATELET
BASOS ABS: 0 10*3/uL (ref 0.0–0.1)
Basophils Relative: 0 %
EOS ABS: 0.2 10*3/uL (ref 0.0–0.7)
EOS PCT: 6 %
HCT: 20.4 % — ABNORMAL LOW (ref 39.0–52.0)
Hemoglobin: 7.1 g/dL — ABNORMAL LOW (ref 13.0–17.0)
Lymphocytes Relative: 18 %
Lymphs Abs: 0.4 10*3/uL — ABNORMAL LOW (ref 0.7–4.0)
MCH: 34.6 pg — ABNORMAL HIGH (ref 26.0–34.0)
MCHC: 34.8 g/dL (ref 30.0–36.0)
MCV: 99.5 fL (ref 78.0–100.0)
Monocytes Absolute: 0.5 10*3/uL (ref 0.1–1.0)
Monocytes Relative: 21 %
NEUTROS PCT: 55 %
Neutro Abs: 1.3 10*3/uL — ABNORMAL LOW (ref 1.7–7.7)
PLATELETS: 133 10*3/uL — AB (ref 150–400)
RBC: 2.05 MIL/uL — AB (ref 4.22–5.81)
RDW: 14.1 % (ref 11.5–15.5)
WBC: 2.4 10*3/uL — AB (ref 4.0–10.5)

## 2016-06-28 LAB — URINE CULTURE: Culture: NO GROWTH

## 2016-06-28 LAB — ABO/RH: ABO/RH(D): A POS

## 2016-06-28 LAB — PREPARE RBC (CROSSMATCH)

## 2016-06-28 MED ORDER — ATORVASTATIN CALCIUM 40 MG PO TABS
80.0000 mg | ORAL_TABLET | Freq: Every day | ORAL | Status: DC
  Administered 2016-06-28: 80 mg
  Filled 2016-06-28: qty 2

## 2016-06-28 MED ORDER — FUROSEMIDE 10 MG/ML IJ SOLN
20.0000 mg | Freq: Once | INTRAMUSCULAR | Status: AC
  Administered 2016-06-28: 20 mg via INTRAVENOUS
  Filled 2016-06-28: qty 2

## 2016-06-28 MED ORDER — ASPIRIN 81 MG PO CHEW
81.0000 mg | CHEWABLE_TABLET | Freq: Every day | ORAL | Status: DC
  Administered 2016-06-28 – 2016-06-29 (×2): 81 mg
  Filled 2016-06-28 (×2): qty 1

## 2016-06-28 MED ORDER — PIPERACILLIN-TAZOBACTAM 3.375 G IVPB 30 MIN
3.3750 g | Freq: Once | INTRAVENOUS | Status: AC
  Administered 2016-06-28: 3.375 g via INTRAVENOUS
  Filled 2016-06-28 (×2): qty 50

## 2016-06-28 MED ORDER — SODIUM CHLORIDE 0.9 % IV SOLN
Freq: Once | INTRAVENOUS | Status: AC
  Administered 2016-06-28: 13:00:00 via INTRAVENOUS

## 2016-06-28 MED ORDER — ISOSORBIDE DINITRATE 20 MG PO TABS
30.0000 mg | ORAL_TABLET | Freq: Three times a day (TID) | ORAL | Status: DC
  Administered 2016-06-28 – 2016-06-29 (×4): 30 mg
  Filled 2016-06-28 (×7): qty 1

## 2016-06-28 MED ORDER — BALSALAZIDE DISODIUM 750 MG PO CAPS
2250.0000 mg | ORAL_CAPSULE | Freq: Three times a day (TID) | ORAL | Status: DC
  Administered 2016-06-28 – 2016-06-29 (×4): 2250 mg via ORAL
  Filled 2016-06-28 (×8): qty 3

## 2016-06-28 MED ORDER — FUROSEMIDE 10 MG/ML IJ SOLN
20.0000 mg | Freq: Once | INTRAMUSCULAR | Status: AC
Start: 2016-06-28 — End: 2016-06-28
  Administered 2016-06-28: 20 mg via INTRAVENOUS
  Filled 2016-06-28: qty 2

## 2016-06-28 NOTE — Progress Notes (Signed)
Andres Smith   DOB:11/03/1940   UK#:[id number]    Subjective: He felt better this morning.  pain is better control.  He complained of excessive fatigue. Noted documented low-grade fever this morning.  Objective:  Vitals:   06/27/16 2000 06/28/16 0414  BP: 128/65 111/60  Pulse: 65 92  Resp: 20 20  Temp: 98.7 F (37.1 C) 99.6 F (37.6 C)     Intake/Output Summary (Last 24 hours) at 06/28/16 0736 Last data filed at 06/28/16 0600  Gross per 24 hour  Intake          3618.33 ml  Output                0 ml  Net          3618.33 ml    GENERAL:alert, no distress and comfortable SKIN: Noted significant ulcerated skin changes on his face and left side of his neck EYES: normal, Conjunctiva are pale and non-injected, sclera clear OROPHARYNX: Noted mucositis.  No thrush NECK: supple, thyroid normal size, non-tender, without nodularity LYMPH:  no palpable lymphadenopathy in the cervical, axillary or inguinal LUNGS: clear to auscultation and percussion with normal breathing effort HEART: regular rate & rhythm and no murmurs and no lower extremity edema ABDOMEN:abdomen soft, non-tender and normal bowel sounds Musculoskeletal:no cyanosis of digits and no clubbing  NEURO: alert & oriented x 3 with fluent speech, no focal motor/sensory deficits   Labs:  Lab Results  Component Value Date   WBC 2.4 (L) 06/28/2016   HGB 7.1 (L) 06/28/2016   HCT 20.4 (L) 06/28/2016   MCV 99.5 06/28/2016   PLT 133 (L) 06/28/2016   NEUTROABS 1.3 (L) 06/28/2016    Lab Results  Component Value Date   NA 138 06/28/2016   K 3.8 06/28/2016   CL 105 06/28/2016   CO2 27 06/28/2016    Studies:  Dg Chest 2 View  Result Date: 06/26/2016 CLINICAL DATA:  Patient with abnormal vital signs. Recent chemotherapy. EXAM: CHEST  2 VIEW COMPARISON:  Chest radiograph 05/22/2016. FINDINGS: Right anterior chest wall Port-A-Cath is present with tip projecting over the superior vena cava. Patient status post median sternotomy.  Stable cardiac and mediastinal contours. Low lung volumes. No large area pulmonary consolidation. No pleural effusion or pneumothorax. Thoracic spine degenerative changes. IMPRESSION: Low lung volumes.  No acute cardiopulmonary process. Electronically Signed   By: Lovey Newcomer M.D.   On: 06/26/2016 18:27   Ct Head Wo Contrast  Result Date: 06/26/2016 CLINICAL DATA:  Foot drop and fever. EXAM: CT HEAD WITHOUT CONTRAST TECHNIQUE: Contiguous axial images were obtained from the base of the skull through the vertex without intravenous contrast. COMPARISON:  None. FINDINGS: Brain: There is no evidence for acute hemorrhage, hydrocephalus, mass lesion, or abnormal extra-axial fluid collection. No definite CT evidence for acute infarction. Diffuse loss of parenchymal volume is consistent with atrophy. Patchy low attenuation in the deep hemispheric and periventricular white matter is nonspecific, but likely reflects chronic microvascular ischemic demyelination. 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 Vascular: No hyperdense vessel or unexpected calcification. Skull: No evidence for fracture. No worrisome lytic or sclerotic lesion. Sinuses/Orbits: The visualized paranasal sinuses and mastoid air cells are clear. Visualized portions of the globes and intraorbital fat are unremarkable. Other: None. IMPRESSION: 1. No acute intracranial abnormality 2. Atrophy with chronic small vessel white matter ischemic disease. 3. Lacunar infarct left cerebellar hemisphere. Electronically Signed   By: Misty Stanley M.D.   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 Continue supportive care  Neutropenic fever He is started on broad-spectrum intravenous antibiotics The source is likely from the head and neck region from mucositis and skin ulceration If cultures remain negative, I recommend discontinuation of vancomycin after 48 hours of  coverage He was started on G-CSF on 06/27/2016 ANC is improving slowly. He should hold radiation treatment if ANC is less than 1.5.  I will alert the radiation department  Acquired pancytopenia ([place]) This is due to side effects of treatment He will be started on G-CSF support until [facility] is greater than 1.5 as above We discussed some of the risks, benefits, and alternatives of blood transfusions. The patient is symptomatic from anemia and the hemoglobin level is critically low.  Some of the side-effects to be expected including risks of transfusion reactions, chills, infection, syndrome of volume overload and risk of hospitalization from various reasons and the patient is willing to proceed and went ahead to sign consent today. He agreed for 2 units of blood transfusion today. I will give him Lasix dose after each unit of blood  Mucositis due to treatment with pain I have a long discussion with the patient and his wife regarding chronic pain management. I recommend he start using pain medicine as prescribed regularly.  He was started on 12 mcg fentanyl patch on 06/27/2016.  His pain is better. I will reassess pain control tomorrow and consider titrating up his fentanyl  Protein calorie malnutrition He is seen by dietitian and will resume tube feeding while hospitalized  Discharge planning He is not ready for discharge due to neutropenic fever I will follow daily [name], MD 06/28/2016  7:36 AM

## 2016-06-28 NOTE — Progress Notes (Signed)
PROGRESS NOTE   Andres Smith  GNF:[id number]    DOB: April 09, 1940    DOA: 06/26/2016  PCP: Janith Lima, MD   I have briefly reviewed patients previous medical [facility] in [facility].  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 In ED, pancytopenia, fever of 101.10F, admitted for evaluation and management of febrile neutropenia.On broad-spectrum IV antibiotics, cultures negative to date, improving. Transfusing for anemia. Oncology following.   Assessment & Plan:   Principal Problem:   Fever Active Problems:   Type II diabetes mellitus with manifestations (HCC)   Essential hypertension, benign   Coronary atherosclerosis of native coronary artery   Ulcerative colitis (HCC)   B12 deficiency anemia   Mucositis due to chemotherapy   Acquired pancytopenia (HCC)   Protein-calorie malnutrition, severe   1. Febrile neutropenia: Neutropenic precautions. Continue empiric IV vancomycin and Zosyn. Chest x-ray and urine microscopy not indicative of infection. As per oncology, source is likely from head and neck region due to mucositis and skin ulceration. Urine culture negative. Blood cultures 2: Negative to date. As per oncology, if cultures remain negative, discontinue vancomycin after 48 hours coverage. 2. Pancytopenia: Oncology input appreciated and started him on G-CSF until [place] is greater than 1.5. Secondary to chemoradiation. [place] slowly improving. Hemoglobin has dropped to 7.1. Oncology transfusing patient with 2 units PRBCs 06/28/16. Holding radiation treatment until [place] >1.5. Follow CBCs daily. 3. CAD status post CABG: No chest pain reported. Continue nitrates, statins and aspirin. 4. Ulcerative colitis: No signs of acute exacerbation 5. PEG  tube feeds: Continue. 6. DM 2: Follow CBGs and SSI. Patient stated that he has stopped taking metformin for now. CBGs widely fluctuating. Monitor on current regimen for now but may need adjustment. CBGs 113-313. 7. Right lower extremity weakness: CT head unremarkable. May consider MRI. PT evaluation. No focal deficits noted. 8. Buccal mucosa squamous cell carcinoma: Oncology input appreciated. No further chemotherapy at this point. Oncology recommends holding radiation treatment if ANC is less than 1.5. Improving. Oncology has started him on fentanyl patch 12.5 MCG for pain control. 9. Mucositis: Management per oncology. 10. Protein calorie malnutrition: Tube feeds.    DVT prophylaxis: Lovenox Code Status: DO NOT RESUSCITATE Family Communication: None at bedside today. Disposition: To be determined. Not medically ready for discharge yet.   Consultants:  Oncology   Procedures:  None  Antimicrobials:  IV vancomycin and Zosyn.    Subjective: Overall feels slightly better. Not as much pain in his mouth or neck. No fever or chills reported. Denies dyspnea, cough or chest pain. No vomiting, diarrhea.  ROS: No bleeding reported. No dizziness, lightheadedness, chest pain or dyspnea.  Objective:  Vitals:   06/28/16 0414 06/28/16 1156 06/28/16 1240 06/28/16 1415  BP: 111/60 (!) 110/59 (!) 110/52 121/68  Pulse: 92 70 87 68  Resp: 20 18 18 18   Temp: 99.6 F (37.6 C) 98.4 F (36.9 C) 98.2 F (36.8 C) 98.6 F (37 C)  TempSrc: Oral Oral Oral Oral  SpO2: 96% 98% 96% 99%  Weight:      Height:        Examination:  General exam: Pleasant elderly male, chronically ill-looking lying comfortably in bed. He looks better than he did yesterday. Oral cavity: Oral mucosa moist  and looks better than it did yesterday. Neck: Radiation skin injury around neck, most prominent on left side of neck with some erythema and oozing. This also looks better than yesterday with less oozing and  redness. Respiratory system: Clear to auscultation. Respiratory effort normal. No change. Cardiovascular system: S1 & S2 heard, RRR. No JVD, murmurs, rubs, gallops or clicks. No pedal edema.  Gastrointestinal system: Abdomen is nondistended, soft and nontender. No organomegaly or masses felt. Normal bowel sounds heard. No change. Central nervous system: Alert and oriented. No focal neurological deficits. Stable. Extremities: Symmetric 5 x 5 power. Skin: No rashes, lesions or ulcers Psychiatry: Judgement and insight appear normal. Mood & affect appropriate.     Data Reviewed: I have personally reviewed following labs and imaging studies  CBC:  Recent Labs Lab 06/26/16 1923 06/27/16 0425 06/28/16 0456  WBC 1.9* 1.4* 2.4*  NEUTROABS 1.0* 0.7* 1.3*  HGB 8.5* 7.8* 7.1*  HCT 24.3* 22.3* 20.4*  MCV 99.6 101.8* 99.5  PLT 138* 140* 417*   Basic Metabolic Panel:  Recent Labs Lab 06/26/16 1923 06/27/16 0425 06/28/16 0456  NA 132* 133* 138  K 4.4 4.3 3.8  CL 97* 101 105  CO2 26 28 27   GLUCOSE 186* 292* 102*  BUN 29* 25* 21*  CREATININE 0.85 0.83 0.83  CALCIUM 8.5* 8.1* 8.0*   Liver Function Tests:  Recent Labs Lab 06/26/16 1923 06/27/16 0425  AST 17 22  ALT 11* 10*  ALKPHOS 46 44  BILITOT 0.2* 0.3  PROT 6.4* 5.6*  ALBUMIN 3.2* 2.7*   CBG:  Recent Labs Lab 06/27/16 2013 06/28/16 0006 06/28/16 0414 06/28/16 0753 06/28/16 1132  GLUCAP 308* 286* 113* 128* 313*    Recent Results (from the past 240 hour(s))  Blood Culture (routine x 2)     Status: None (Preliminary result)   Collection Time: 06/26/16  7:23 PM  Result Value Ref Range Status   Specimen Description BLOOD PORTA CATH  Final   Special Requests   Final    BOTTLES DRAWN AEROBIC AND ANAEROBIC Blood Culture adequate volume   Culture   Final    NO GROWTH < 24 HOURS Performed at [facility], [place] [address], [place], [place] [address]    Report Status PENDING  Incomplete  Blood Culture (routine  x 2)     Status: None (Preliminary result)   Collection Time: 06/26/16  7:24 PM  Result Value Ref Range Status   Specimen Description BLOOD LEFT ANTECUBITAL  Final   Special Requests   Final    BOTTLES DRAWN AEROBIC AND ANAEROBIC Blood Culture adequate volume   Culture   Final    NO GROWTH < 24 HOURS Performed at [facility], [place] [address] [place], [place] [address]    Report Status PENDING  Incomplete  Urine culture     Status: None   Collection Time: 06/26/16 10:28 PM  Result Value Ref Range Status   Specimen Description URINE, CLEAN CATCH  Final   Special Requests NONE  Final   Culture   Final    NO GROWTH Performed at [facility], [place] [address] [place],  [address]    Report Status 06/28/2016 FINAL  Final         Radiology Studies: Dg Chest 2 View  Result Date: 06/26/2016 CLINICAL DATA:  Patient with abnormal vital signs. Recent chemotherapy. EXAM: CHEST  2 VIEW COMPARISON:  Chest radiograph 05/22/2016. FINDINGS: Right anterior chest wall Port-A-Cath is present  with tip projecting over the superior vena cava. Patient status post median sternotomy. Stable cardiac and mediastinal contours. Low lung volumes. No large area pulmonary consolidation. No pleural effusion or pneumothorax. Thoracic spine degenerative changes. IMPRESSION: Low lung volumes.  No acute cardiopulmonary process. Electronically Signed   By: Lovey Newcomer M.D.   On: 06/26/2016 18:27   Ct Head Wo Contrast  Result Date: 06/26/2016 CLINICAL DATA:  Foot drop and fever. EXAM: CT HEAD WITHOUT CONTRAST TECHNIQUE: Contiguous axial images were obtained from the base of the skull through the vertex without intravenous contrast. COMPARISON:  None. FINDINGS: Brain: There is no evidence for acute hemorrhage, hydrocephalus, mass lesion, or abnormal extra-axial fluid collection. No definite CT evidence for acute infarction. Diffuse loss of parenchymal volume is consistent with atrophy. Patchy low  attenuation in the deep hemispheric and periventricular white matter is nonspecific, but likely reflects chronic microvascular ischemic demyelination. 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 Vascular: No hyperdense vessel or unexpected calcification. Skull: No evidence for fracture. No worrisome lytic or sclerotic lesion. Sinuses/Orbits: The visualized paranasal sinuses and mastoid air cells are clear. Visualized portions of the globes and intraorbital fat are unremarkable. Other: None. IMPRESSION: 1. No acute intracranial abnormality 2. Atrophy with chronic small vessel white matter ischemic disease. 3. Lacunar infarct left cerebellar hemisphere. Electronically Signed   By: Misty Stanley M.D.   On: 06/26/2016 20:42        Scheduled Meds: . aspirin  81 mg Per Tube Daily  . atorvastatin  80 mg Per Tube QHS  . balsalazide  2,250 mg Oral TID  . chlorhexidine  15 mL Mouth Rinse BID  . enoxaparin (LOVENOX) injection  40 mg Subcutaneous Q24H  . feeding supplement (OSMOLITE 1.5 CAL)  355 mL Per Tube 4 times per day  . feeding supplement (PRO-STAT SUGAR FREE 64)  30 mL Per Tube BID  . fentaNYL  12.5 mcg Transdermal Q72H  . furosemide  20 mg Intravenous Once  . insulin aspart  0-9 Units Subcutaneous Q4H  . isosorbide dinitrate  30 mg Per Tube TID  . mouth rinse  15 mL Mouth Rinse q12n4p  . polyethylene glycol  17 g Per Tube Daily  . Tbo-filgastrim (GRANIX) SQ  300 mcg Subcutaneous Daily   Continuous Infusions: . piperacillin-tazobactam (ZOSYN)  IV Stopped (06/28/16 6237)  . vancomycin Stopped (06/28/16 1052)     LOS: 1 day     Keigo Whalley, MD, FACP, FHM. [facility] Pager [phone]  If 7PM-7AM, please contact night-coverage [email or web address] Password [facility] 06/28/2016, 3:01 PM

## 2016-06-29 ENCOUNTER — Ambulatory Visit: Payer: PPO

## 2016-06-29 ENCOUNTER — Ambulatory Visit
Admission: RE | Admit: 2016-06-29 | Discharge: 2016-06-29 | Disposition: A | Discharge status: Home / Self Care | Payer: PPO | Source: Ambulatory Visit | Attending: Radiation Oncology | Admitting: Radiation Oncology

## 2016-06-29 ENCOUNTER — Telehealth: Payer: Self-pay | Admitting: Oncology

## 2016-06-29 LAB — TYPE AND SCREEN
ABO/RH(D): A POS
Antibody Screen: NEGATIVE
UNIT DIVISION: 0
UNIT DIVISION: 0

## 2016-06-29 LAB — BPAM RBC
Blood Product Expiration Date: 201807102359
Blood Product Expiration Date: 201807102359
ISSUE DATE / TIME: 201806281213
ISSUE DATE / TIME: 201806281545
UNIT TYPE AND RH: 6200
Unit Type and Rh: 6200

## 2016-06-29 LAB — CBC WITH DIFFERENTIAL/PLATELET
BASOS ABS: 0 10*3/uL (ref 0.0–0.1)
Basophils Relative: 1 %
EOS ABS: 0.2 10*3/uL (ref 0.0–0.7)
Eosinophils Relative: 4 %
HCT: 26.8 % — ABNORMAL LOW (ref 39.0–52.0)
Hemoglobin: 9.4 g/dL — ABNORMAL LOW (ref 13.0–17.0)
Lymphocytes Relative: 14 %
Lymphs Abs: 0.6 10*3/uL — ABNORMAL LOW (ref 0.7–4.0)
MCH: 33.5 pg (ref 26.0–34.0)
MCHC: 35.1 g/dL (ref 30.0–36.0)
MCV: 95.4 fL (ref 78.0–100.0)
Monocytes Absolute: 0.9 10*3/uL (ref 0.1–1.0)
Monocytes Relative: 22 %
NEUTROS ABS: 2.4 10*3/uL (ref 1.7–7.7)
NEUTROS PCT: 59 %
PLATELETS: 159 10*3/uL (ref 150–400)
RBC: 2.81 MIL/uL — AB (ref 4.22–5.81)
RDW: 16.7 % — ABNORMAL HIGH (ref 11.5–15.5)
WBC: 4.1 10*3/uL (ref 4.0–10.5)

## 2016-06-29 LAB — GLUCOSE, CAPILLARY
GLUCOSE-CAPILLARY: 150 mg/dL — AB (ref 65–99)
GLUCOSE-CAPILLARY: 273 mg/dL — AB (ref 65–99)
Glucose-Capillary: 154 mg/dL — ABNORMAL HIGH (ref 65–99)
Glucose-Capillary: 134 mg/dL — ABNORMAL HIGH (ref 65–99)

## 2016-06-29 LAB — BASIC METABOLIC PANEL
Anion gap: 7 (ref 5–15)
BUN: 24 mg/dL — AB (ref 6–20)
CO2: 28 mmol/L (ref 22–32)
CREATININE: 0.83 mg/dL (ref 0.61–1.24)
Calcium: 8.2 mg/dL — ABNORMAL LOW (ref 8.9–10.3)
Chloride: 100 mmol/L — ABNORMAL LOW (ref 101–111)
GFR calc Af Amer: >60 mL/min (ref >60)
Glucose, Bld: 142 mg/dL — ABNORMAL HIGH (ref 65–99)
Potassium: 3.7 mmol/L (ref 3.5–5.1)
SODIUM: 135 mmol/L (ref 135–145)

## 2016-06-29 MED ORDER — AMOXICILLIN-POT CLAVULANATE 250-62.5 MG/5ML PO SUSR: 500.0000 mg | Freq: Three times a day (TID) | ORAL | 0 refills | Status: AC

## 2016-06-29 MED ORDER — METFORMIN HCL 500 MG PO TABS: 500.0000 mg | ORAL_TABLET | Freq: Two times a day (BID) | ORAL | 0 refills | Status: DC

## 2016-06-29 MED ORDER — MAGIC MOUTHWASH W/LIDOCAINE: 10.0000 mL | Freq: Four times a day (QID) | ORAL | 0 refills | Status: DC | PRN

## 2016-06-29 MED ORDER — ISOSORBIDE MONONITRATE ER 60 MG PO TB24: 90.0000 mg | ORAL_TABLET | Freq: Every day | ORAL | 0 refills | Status: DC

## 2016-06-29 MED ORDER — FENTANYL 12 MCG/HR TD PT72: 12.0000 ug | MEDICATED_PATCH | TRANSDERMAL | 0 refills | Status: DC

## 2016-06-29 MED ORDER — HEPARIN SOD (PORK) LOCK FLUSH 100 UNIT/ML IV SOLN
500.0000 [IU] | INTRAVENOUS | Status: AC | PRN
  Administered 2016-06-29: 500 [IU]

## 2016-06-29 NOTE — Discharge Summary (Signed)
Physician Discharge Summary  Andres Smith QJF:[id number] DOB: 1940/08/27  PCP: Janith Lima, MD  Admit date: 06/26/2016 Discharge date: 06/29/2016  Recommendations for Outpatient Follow-up:  1. Dr. [name], PCP in 5 days with repeat labs (CBC with differential & BMP). Please follow final blood culture results that were sent from the hospital. 2. Dr. [name], Oncology in 1 week. 3. Patient advised to continue outpatient radiation therapy appointments.  Home Health: None Equipment/Devices: None    Discharge Condition: Improved and stable  CODE STATUS: DO NOT RESUSCITATE  Diet recommendation: Heart healthy and diabetic diet.  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 In ED, pancytopenia, fever of 101.28F, admitted for evaluation and management of febrile neutropenia.   Assessment & Plan:   1. Febrile neutropenia: Neutropenic precautions. Treated empirically with IV vancomycin and Zosyn. Chest x-ray and urine microscopy not indicative of infection. As per oncology, source is likely from head and neck region due to mucositis and skin ulceration. Urine culture negative. Blood cultures 2: Negative to date. Clinically improved. Discussed with oncology. Okay to discharge home on oral antibiotics/Augmentin for additional 1 week. 2. Pancytopenia: Oncology was consulted. Transfused  2 units of PRBCs. Received G-CSF. Hemoglobin improved from 7.1 to 9.4. WBC is improved from 1.4-4.1. Platelet counts have normalized. Oncology advised that he can resume radiation treatment and cleared him for discharge home. 3. CAD status post CABG: No chest pain reported. Continue nitrates, statins and aspirin. 4. Ulcerative colitis: No signs of acute exacerbation 5. PEG tube feeds: Continue. 6. DM 2:  patient had stopped taking metformin at home for unclear reasons. CBG is in the hospital were uncontrolled and widely fluctuating. He was placed on SSI here. At discharge, advised to resume metformin and close outpatient follow-up with PCP. 7. Right lower extremity weakness: CT head unremarkable. No focal deficits noted. Ambulating without difficulty. 8. Buccal mucosa squamous cell carcinoma: Oncology input appreciated. No further chemotherapy at this point. Oncology has started him on fentanyl patch 12.5 MCG for pain control and this is controlling his pain adequately. Patient had fentanyl patch 25 g at home which she has not used. He was advised to discard those medications and was given a new prescription for fentanyl patch 12.5 g. This was clarified couple of times with the patient. He verbalized understanding. 9. Mucositis: Management per oncology. Improved. Continue topical treatment. 10. Protein calorie malnutrition: Tube feeds.    Consultants:  Oncology   Procedures:  None   Discharge Instructions  Discharge Instructions    Call MD for:  difficulty breathing, headache or visual disturbances    Complete by:  As directed    Call MD for:  extreme fatigue    Complete by:  As directed    Call MD for:  persistant dizziness or light-headedness    Complete by:  As directed    Call MD for:  persistant nausea and vomiting  Complete by:  As directed    Call MD for:  redness, tenderness, or signs of infection (pain, swelling, redness, odor or green/yellow discharge around incision  site)    Complete by:  As directed    Call MD for:  severe uncontrolled pain    Complete by:  As directed    Call MD for:  temperature >100.4    Complete by:  As directed    Diet - low sodium heart healthy    Complete by:  As directed    Increase activity slowly    Complete by:  As directed        Medication List    STOP taking these medications   fentaNYL 25 MCG/HR patch Commonly known as:  DURAGESIC - dosed mcg/hr Replaced by:  fentaNYL 12 MCG/HR   lidocaine 2 % solution Commonly known as:  XYLOCAINE   mercaptopurine 50 MG tablet Commonly known as:  PURINETHOL   nystatin 100000 UNIT/ML suspension Commonly known as:  MYCOSTATIN   oxyCODONE 5 MG immediate release tablet Commonly known as:  Oxy IR/ROXICODONE     TAKE these medications   amoxicillin-clavulanate 250-62.5 MG/5ML suspension Commonly known as:  AUGMENTIN Place 10 mLs (500 mg total) into feeding tube 3 (three) times daily.   aspirin EC 81 MG tablet Take 81 mg by mouth daily.   atorvastatin 80 MG tablet Commonly known as:  LIPITOR Take 80 mg by mouth at bedtime.   balsalazide 750 MG capsule Commonly known as:  COLAZAL Take 3 capsules (2,250 mg total) by mouth 3 (three) times daily.   cyanocobalamin 1000 MCG/ML injection Commonly known as:  (VITAMIN B-12) INJECT 1ML ONCE MONTHLY.   feeding supplement (OSMOLITE 1.5 CAL) Liqd Give 1 and 1/2 cans Osmolite 1.5 via PEG 4 times daily with 60 mL free water before and after bolus feedings. In addition, drink or flush PEG with 240 mL free water TID. Send formula and supplies.   fentaNYL 12 MCG/HR Commonly known as:  DURAGESIC - dosed mcg/hr Place 1 patch (12.5 mcg total) onto the skin every 3 (three) days. Replaces:  fentaNYL 25 MCG/HR patch   isosorbide mononitrate 60 MG 24 hr tablet Commonly known as:  IMDUR Take 1.5 tablets (90 mg total) by mouth daily.   lidocaine-prilocaine cream Commonly known as:  EMLA Apply to affected area once   magic  mouthwash w/lidocaine Soln Take 10 mLs by mouth 4 (four) times daily as needed for mouth pain.   metFORMIN 500 MG tablet Commonly known as:  GLUCOPHAGE Place 1 tablet (500 mg total) into feeding tube 2 (two) times daily with a meal. What changed:  how to take this  additional instructions   morphine CONCENTRATE 10 mg / 0.5 ml concentrated solution Place 0.5 mLs (10 mg total) into feeding tube every 2 (two) hours as needed for severe pain.   nitroGLYCERIN 0.4 MG SL tablet Commonly known as:  NITROSTAT Place 0.4 mg under the tongue every 5 (five) minutes as needed for chest pain.   ondansetron 8 MG tablet Commonly known as:  ZOFRAN Take 1 tablet (8 mg total) by mouth 2 (two) times daily as needed. Start on the third day after chemotherapy.   polyethylene glycol packet Commonly known as:  MIRALAX / GLYCOLAX Place 17 g into feeding tube daily.   prochlorperazine 10 MG tablet Commonly known as:  COMPAZINE Take 1 tablet (10 mg total) by mouth every 6 (six) hours as needed (Nausea or vomiting).   SENEXON PO Place  1 packet into feeding tube daily as needed (constipation).      Follow-up Information    Janith Lima, MD. Schedule an appointment as soon as possible for a visit in 5 day(s).   Specialty:  Internal Medicine Why:  To be seen with repeat labs (CBC with differential and & BMP). Contact information: [address] [place] [address] [phone]        [name], MD. Schedule an appointment as soon as possible for a visit in 1 week(s).   Specialty:  Hematology and Oncology Contact information: [place] [id number] [phone]          No Known Allergies    Procedures/Studies: Dg Chest 2 View  Result Date: 06/26/2016 CLINICAL DATA:  Patient with abnormal vital signs. Recent chemotherapy. EXAM: CHEST  2 VIEW COMPARISON:  Chest radiograph 05/22/2016. FINDINGS: Right anterior chest wall Port-A-Cath is  present with tip projecting over the superior vena cava. Patient status post median sternotomy. Stable cardiac and mediastinal contours. Low lung volumes. No large area pulmonary consolidation. No pleural effusion or pneumothorax. Thoracic spine degenerative changes. IMPRESSION: Low lung volumes.  No acute cardiopulmonary process. Electronically Signed   By: Lovey Newcomer M.D.   On: 06/26/2016 18:27   Ct Head Wo Contrast  Result Date: 06/26/2016 CLINICAL DATA:  Foot drop and fever. EXAM: CT HEAD WITHOUT CONTRAST TECHNIQUE: Contiguous axial images were obtained from the base of the skull through the vertex without intravenous contrast. COMPARISON:  None. FINDINGS: Brain: There is no evidence for acute hemorrhage, hydrocephalus, mass lesion, or abnormal extra-axial fluid collection. No definite CT evidence for acute infarction. Diffuse loss of parenchymal volume is consistent with atrophy. Patchy low attenuation in the deep hemispheric and periventricular white matter is nonspecific, but likely reflects chronic microvascular ischemic demyelination. 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 Vascular: No hyperdense vessel or unexpected calcification. Skull: No evidence for fracture. No worrisome lytic or sclerotic lesion. Sinuses/Orbits: The visualized paranasal sinuses and mastoid air cells are clear. Visualized portions of the globes and intraorbital fat are unremarkable. Other: None. IMPRESSION: 1. No acute intracranial abnormality 2. Atrophy with chronic small vessel white matter ischemic disease. 3. Lacunar infarct left cerebellar hemisphere. Electronically Signed   By: Misty Stanley M.D.   On: 06/26/2016 20:42      Subjective: Seen this morning. Continues to improve and feels much better. Mouth soreness has improved. Requests oral antibiotics and liquid preparation. Tolerating tube feeds. Neck oozing and pain have improved. As per RN, no acute issues and ambulating steadily in the room without  difficulty.  Discharge Exam:  Vitals:   06/28/16 1850 06/28/16 2211 06/29/16 0440 06/29/16 1258  BP: 135/64 122/75 127/63 125/70  Pulse: 74 70 73 73  Resp: 18 18 20 20   Temp: 99 F (37.2 C) 98.7 F (37.1 C) 98.7 F (37.1 C) 98.2 F (36.8 C)  TempSrc: Oral Oral Oral Oral  SpO2: 97% 98% 98% 98%  Weight:      Height:        General exam: Pleasant elderly male, chronically ill-looking sitting up comfortably in reclining chair this morning. Oral cavity: Oral mucosa moist and continues to improve. Neck: Radiation skin injury around neck, most prominent on left side of neck, erythema and oozing have significantly improved. Respiratory system: Clear to auscultation. Respiratory effort normal.  Cardiovascular system: S1 & S2 heard, RRR. No JVD, murmurs, rubs, gallops or clicks. No pedal edema.  Gastrointestinal system: Abdomen is  nondistended, soft and nontender. No organomegaly or masses felt. Normal bowel sounds heard.  Central nervous system: Alert and oriented. No focal neurological deficits.  Extremities: Symmetric 5 x 5 power. Skin: No rashes, lesions or ulcers Psychiatry: Judgement and insight appear normal. Mood & affect appropriate.    The results of significant diagnostics from this hospitalization (including imaging, microbiology, ancillary and laboratory) are listed below for reference.     Microbiology: Recent Results (from the past 240 hour(s))  Blood Culture (routine x 2)     Status: None (Preliminary result)   Collection Time: 06/26/16  7:23 PM  Result Value Ref Range Status   Specimen Description BLOOD PORTA CATH  Final   Special Requests   Final    BOTTLES DRAWN AEROBIC AND ANAEROBIC Blood Culture adequate volume   Culture   Final    NO GROWTH 2 DAYS Performed at [facility], [address], [place], [place] [address]    Report Status PENDING  Incomplete  Blood Culture (routine x 2)     Status: None (Preliminary result)   Collection Time: 06/26/16   7:24 PM  Result Value Ref Range Status   Specimen Description BLOOD LEFT ANTECUBITAL  Final   Special Requests   Final    BOTTLES DRAWN AEROBIC AND ANAEROBIC Blood Culture adequate volume   Culture   Final    NO GROWTH 2 DAYS Performed at [facility], [place] [address], [place], [place] [address]    Report Status PENDING  Incomplete  Urine culture     Status: None   Collection Time: 06/26/16 10:28 PM  Result Value Ref Range Status   Specimen Description URINE, CLEAN CATCH  Final   Special Requests NONE  Final   Culture   Final    NO GROWTH Performed at [facility], [place] [address], [place], [place] [address]    Report Status 06/28/2016 FINAL  Final     Labs: CBC:  Recent Labs Lab 06/26/16 1923 06/27/16 0425 06/28/16 0456 06/29/16 0454  WBC 1.9* 1.4* 2.4* 4.1  NEUTROABS 1.0* 0.7* 1.3* 2.4  HGB 8.5* 7.8* 7.1* 9.4*  HCT 24.3* 22.3* 20.4* 26.8*  MCV 99.6 101.8* 99.5 95.4  PLT 138* 140* 133* 254   Basic Metabolic Panel:  Recent Labs Lab 06/26/16 1923 06/27/16 0425 06/28/16 0456 06/29/16 0454  NA 132* 133* 138 135  K 4.4 4.3 3.8 3.7  CL 97* 101 105 100*  CO2 26 28 27 28   GLUCOSE 186* 292* 102* 142*  BUN 29* 25* 21* 24*  CREATININE 0.85 0.83 0.83 0.83  CALCIUM 8.5* 8.1* 8.0* 8.2*   Liver Function Tests:  Recent Labs Lab 06/26/16 1923 06/27/16 0425  AST 17 22  ALT 11* 10*  ALKPHOS 46 44  BILITOT 0.2* 0.3  PROT 6.4* 5.6*  ALBUMIN 3.2* 2.7*   CBG:  Recent Labs Lab 06/28/16 2006 06/29/16 0008 06/29/16 0436 06/29/16 0732 06/29/16 1256  GLUCAP 310* 273* 154* 134* 150*   Urinalysis    Component Value Date/Time   COLORURINE YELLOW 06/26/2016 2228   APPEARANCEUR CLEAR 06/26/2016 2228   LABSPEC 1.015 06/26/2016 2228   PHURINE 7.0 06/26/2016 2228   GLUCOSEU >=500 (A) 06/26/2016 2228   GLUCOSEU NEGATIVE 05/16/2015 1621   HGBUR NEGATIVE 06/26/2016 2228   BILIRUBINUR NEGATIVE 06/26/2016 2228   KETONESUR NEGATIVE 06/26/2016 2228    PROTEINUR 30 (A) 06/26/2016 2228   UROBILINOGEN 0.2 05/16/2015 1621   NITRITE NEGATIVE 06/26/2016 2228   LEUKOCYTESUR  NEGATIVE 06/26/2016 2228      Time coordinating discharge: Over 30 minutes  SIGNED:  Vernell Leep, MD, FACP, FHM. [facility] Pager [phone]  If 7PM-7AM, please contact night-coverage [email or web address] Password [email or web address] 06/29/2016, 4:07 PM

## 2016-06-29 NOTE — Progress Notes (Signed)
Pharmacy Antibiotic Note  Andres Smith is a 76 y.o. male admitted on 06/26/2016 with febrile neutropenia.  Source likely from the head and neck region from mucositis and skin ulceration per oncology.  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 Patient initiated on vancomycin and zosyn.  Vancomycin discontinued after 48 hours of treatment.  Remains on Zosyn.   SCr stable. ANC improved after 2 doses of Granix. Blood and urine cultures no growth to date.  Plan: Continue Zosyn 3.375g IV q8h (4 hour infusion time).  Current dosage is appropriate and need for further dosage adjustment appears unlikely at present.  Will sign off at this time.  Please reconsult if a change in clinical status warrants re-evaluation of dosage. Would suggest Augmentin suspension when patient ready to transition to oral antibiotics for coverage of both skin and mouth organisms.  Would recommend 500 mg TID.   Height: 5\' 8"  (172.7 cm) Weight: 181 lb 14.1 oz (82.5 kg) IBW/kg (Calculated) : 68.4  Temp (24hrs), Avg:98.6 F (37 C), Min:98.2 F (36.8 C), Max:99 F (37.2 C)   Recent Labs Lab 06/26/16 1923 06/26/16 1936 06/27/16 0425 06/28/16 0456 06/29/16 0454  WBC 1.9*  --  1.4* 2.4* 4.1  CREATININE 0.85  --  0.83 0.83 0.83  LATICACIDVEN  --  0.72  --   --   --     Estimated Creatinine Clearance: 79.3 mL/min (by C-G formula based on SCr of 0.83 mg/dL).    No Known Allergies  Antimicrobials this admission: 6/26 >> Zosyn >> 6/26 >> Vancomycin >> 6/28  Dose adjustments this admission: --  Microbiology results: 6/26 BCx: ngtd 6/26 UCx: NGF  Thank you for allowing pharmacy to be a part of this patient's care.  Hershal Coria, PharmD, BCPS Pager: [phone] 06/29/2016 11:56 AM

## 2016-06-29 NOTE — Discharge Instructions (Signed)

## 2016-06-29 NOTE — Telephone Encounter (Signed)
Called patient's nurse, Erasmo Downer, RN on [address].  She said patient is OK for radiation today.  ANC is now 2.4.  [place] with Dr. Isidore Moos for treatment if patient is feeling OK.  Notified Candace, RTT on [place] [address].

## 2016-06-29 NOTE — Progress Notes (Signed)
[facility] Therapy Treatment Record Phone [phone]   Radiation Therapy was administered to [name] on: 06/29/2016  12:02 PM and was treatment # 25 out of a planned course of 30 treatments.  Radiation Treatment  1). Beam photons with 6-10 energy and Photons 6-10 MeV  2). Brachytherapy None  3). Stereotactic Radiosurgery None  4). Other Radiation None     Adiba Fargnoli F Laren Whaling, RT (T)

## 2016-06-29 NOTE — Progress Notes (Signed)
Andres Smith   DOB:1940-10-07   LG#:[id number]    Subjective: He felt better.  Pain is better control.  He is afebrile.  He is able to tolerate feeding tube supplement without problems  Objective:  Vitals:   06/28/16 2211 06/29/16 0440  BP: 122/75 127/63  Pulse: 70 73  Resp: 18 20  Temp: 98.7 F (37.1 C) 98.7 F (37.1 C)     Intake/Output Summary (Last 24 hours) at 06/29/16 0814 Last data filed at 06/29/16 0600  Gross per 24 hour  Intake             1010 ml  Output                0 ml  Net             1010 ml    GENERAL:alert, no distress and comfortable SKIN: Noted persistent skin changes on his neck.  No active bleeding EYES: normal, Conjunctiva are pink and non-injected, sclera clear OROPHARYNX: Noted mucositis.  No thrush NECK: supple, thyroid normal size, non-tender, without nodularity LYMPH:  no palpable lymphadenopathy in the cervical, axillary or inguinal LUNGS: clear to auscultation and percussion with normal breathing effort HEART: regular rate & rhythm and no murmurs and no lower extremity edema ABDOMEN:abdomen soft, non-tender and normal bowel sounds Musculoskeletal:no cyanosis of digits and no clubbing  NEURO: alert & oriented x 3 with fluent speech, no focal motor/sensory deficits   Labs:  Lab Results  Component Value Date   WBC 4.1 06/29/2016   HGB 9.4 (L) 06/29/2016   HCT 26.8 (L) 06/29/2016   MCV 95.4 06/29/2016   PLT 159 06/29/2016   NEUTROABS 2.4 06/29/2016    Lab Results  Component Value Date   NA 135 06/29/2016   K 3.7 06/29/2016   CL 100 (L) 06/29/2016   CO2 28 06/29/2016    Assessment & Plan:   Buccal mucosa squamous cell carcinoma ([place]) I will not proceed with further chemotherapy. Continue supportive care  Neutropenic fever He is started on broad-spectrum intravenous antibiotics The source is likely from the head and neck region from mucositis and skin ulceration He was started on G-CSF on 06/27/2016 ANC is improving slowly. He  can go home with oral antibiotics  Acquired pancytopenia ([place]) This is due to side effects of treatment He will be started on G-CSF support until [facility] is greater than 1.5 as above  he received blood transfusion on 06/28/2016   Mucositis due to treatment with pain I have a long discussion with the patient and his wife regarding chronic pain management. I recommend he start using pain medicine as prescribed regularly.  He was started on 12 mcg fentanyl patch on 06/27/2016.  His pain is better. Consider titrating up his fentanyl  Protein calorie malnutrition He is seen by dietitian and will resume tube feeding while hospitalized  Discharge planning Hopefully he can be discharged soon I will schedule return outpatient appointment  [name], MD 06/29/2016  8:28 AM

## 2016-06-30 ENCOUNTER — Telehealth: Payer: Self-pay | Admitting: Nurse Practitioner

## 2016-06-30 ENCOUNTER — Other Ambulatory Visit: Payer: Self-pay | Admitting: Nurse Practitioner

## 2016-06-30 MED ORDER — NITROGLYCERIN 0.4 MG/HR TD PT24: 0.4000 mg | MEDICATED_PATCH | Freq: Every day | TRANSDERMAL | 6 refills | Status: DC

## 2016-06-30 NOTE — Telephone Encounter (Signed)
   Pts wife called.  Pt is currently receiving radiation to head/neck and has a feeding tube.  They are unable to crush imdur for use in feeding tube.  I d/c'd imdur and sent in Rx for NTG patch 0.4 mg daily, #30, 6 refills.  I did advise that patches are generally more expensive than pills.  Pts wife hopes this is a short term solution.  Caller verbalized understanding and was grateful for the call back.  Murray Hodgkins, NP 06/30/2016, 10:35 AM

## 2016-07-01 LAB — CULTURE, BLOOD (ROUTINE X 2)
Culture: NO GROWTH
Culture: NO GROWTH
SPECIAL REQUESTS: ADEQUATE
Special Requests: ADEQUATE

## 2016-07-01 NOTE — Telephone Encounter (Signed)
Could go to 40 mg immediate release BID for ease of dosing/ability to crush pills. thanks

## 2016-07-02 ENCOUNTER — Ambulatory Visit
Admission: RE | Admit: 2016-07-02 | Discharge: 2016-07-02 | Disposition: A | Discharge status: Home / Self Care | Payer: PPO | Source: Ambulatory Visit | Attending: Radiation Oncology | Admitting: Radiation Oncology

## 2016-07-02 ENCOUNTER — Telehealth: Payer: Self-pay | Admitting: Hematology and Oncology

## 2016-07-02 DIAGNOSIS — K5090 Crohn's disease, unspecified, without complications: Secondary | ICD-10-CM | POA: Diagnosis not present

## 2016-07-02 DIAGNOSIS — I2510 Atherosclerotic heart disease of native coronary artery without angina pectoris: Secondary | ICD-10-CM | POA: Diagnosis not present

## 2016-07-02 DIAGNOSIS — E119 Type 2 diabetes mellitus without complications: Secondary | ICD-10-CM | POA: Diagnosis not present

## 2016-07-02 DIAGNOSIS — Z510 Encounter for antineoplastic radiation therapy: Secondary | ICD-10-CM | POA: Diagnosis not present

## 2016-07-02 DIAGNOSIS — Z85828 Personal history of other malignant neoplasm of skin: Secondary | ICD-10-CM | POA: Diagnosis not present

## 2016-07-02 DIAGNOSIS — Z931 Gastrostomy status: Secondary | ICD-10-CM | POA: Diagnosis not present

## 2016-07-02 DIAGNOSIS — Z951 Presence of aortocoronary bypass graft: Secondary | ICD-10-CM | POA: Diagnosis not present

## 2016-07-02 DIAGNOSIS — Z7984 Long term (current) use of oral hypoglycemic drugs: Secondary | ICD-10-CM | POA: Diagnosis not present

## 2016-07-02 DIAGNOSIS — Z8261 Family history of arthritis: Secondary | ICD-10-CM | POA: Diagnosis not present

## 2016-07-02 DIAGNOSIS — E785 Hyperlipidemia, unspecified: Secondary | ICD-10-CM | POA: Diagnosis not present

## 2016-07-02 DIAGNOSIS — C060 Malignant neoplasm of cheek mucosa: Secondary | ICD-10-CM

## 2016-07-02 DIAGNOSIS — Z87891 Personal history of nicotine dependence: Secondary | ICD-10-CM | POA: Diagnosis not present

## 2016-07-02 DIAGNOSIS — K219 Gastro-esophageal reflux disease without esophagitis: Secondary | ICD-10-CM | POA: Diagnosis not present

## 2016-07-02 DIAGNOSIS — Z87442 Personal history of urinary calculi: Secondary | ICD-10-CM | POA: Diagnosis not present

## 2016-07-02 DIAGNOSIS — K5900 Constipation, unspecified: Secondary | ICD-10-CM | POA: Diagnosis not present

## 2016-07-02 DIAGNOSIS — Z8249 Family history of ischemic heart disease and other diseases of the circulatory system: Secondary | ICD-10-CM | POA: Diagnosis not present

## 2016-07-02 DIAGNOSIS — I10 Essential (primary) hypertension: Secondary | ICD-10-CM | POA: Diagnosis not present

## 2016-07-02 DIAGNOSIS — K1230 Oral mucositis (ulcerative), unspecified: Secondary | ICD-10-CM | POA: Diagnosis not present

## 2016-07-02 DIAGNOSIS — R42 Dizziness and giddiness: Secondary | ICD-10-CM | POA: Diagnosis not present

## 2016-07-02 DIAGNOSIS — Z7982 Long term (current) use of aspirin: Secondary | ICD-10-CM | POA: Diagnosis not present

## 2016-07-02 DIAGNOSIS — K760 Fatty (change of) liver, not elsewhere classified: Secondary | ICD-10-CM | POA: Diagnosis not present

## 2016-07-02 DIAGNOSIS — E042 Nontoxic multinodular goiter: Secondary | ICD-10-CM | POA: Diagnosis not present

## 2016-07-02 DIAGNOSIS — Z79899 Other long term (current) drug therapy: Secondary | ICD-10-CM | POA: Diagnosis not present

## 2016-07-02 MED ORDER — SILVER SULFADIAZINE 1 % EX CREA
TOPICAL_CREAM | Freq: Every day | CUTANEOUS | Status: DC
  Administered 2016-07-02: 14:00:00 via TOPICAL

## 2016-07-02 NOTE — Progress Notes (Signed)
Went to pharmacy and obtained a  400gm jar silvadene jar for patient, still has a lot desquamation b/l neck area, patient knows ri apply after tradiation treatemnts daily and prn, nothing 4 hours before rad txs,  And must remove with gentle washing with soap and wwarm water before re-applying to neck area 2:30 PM

## 2016-07-02 NOTE — Telephone Encounter (Signed)
Scheduled appt per sch message from NG 6/29 - gave patient calender.

## 2016-07-03 ENCOUNTER — Ambulatory Visit
Admission: RE | Admit: 2016-07-03 | Discharge: 2016-07-03 | Disposition: A | Discharge status: Home / Self Care | Payer: PPO | Source: Ambulatory Visit | Attending: Radiation Oncology | Admitting: Radiation Oncology

## 2016-07-03 DIAGNOSIS — Z510 Encounter for antineoplastic radiation therapy: Secondary | ICD-10-CM | POA: Diagnosis not present

## 2016-07-03 DIAGNOSIS — C060 Malignant neoplasm of cheek mucosa: Secondary | ICD-10-CM | POA: Diagnosis not present

## 2016-07-03 MED ORDER — ISOSORBIDE MONONITRATE 20 MG PO TABS: ORAL_TABLET | ORAL | 11 refills | Status: DC

## 2016-07-03 NOTE — Telephone Encounter (Signed)
I left a voicemail on the pt's mobile with instructions regarding Isosorbide mononitrate. Megan Supple Pharm D assisted in sending in this prescription.

## 2016-07-04 ENCOUNTER — Ambulatory Visit: Payer: PPO

## 2016-07-05 ENCOUNTER — Ambulatory Visit
Admission: RE | Admit: 2016-07-05 | Discharge: 2016-07-05 | Disposition: A | Discharge status: Home / Self Care | Payer: PPO | Source: Ambulatory Visit | Attending: Radiation Oncology | Admitting: Radiation Oncology

## 2016-07-05 ENCOUNTER — Encounter: Payer: Self-pay | Admitting: Cardiovascular Disease

## 2016-07-05 ENCOUNTER — Ambulatory Visit: Payer: PPO

## 2016-07-05 ENCOUNTER — Telehealth: Payer: Self-pay | Admitting: Cardiovascular Disease

## 2016-07-05 ENCOUNTER — Ambulatory Visit (INDEPENDENT_AMBULATORY_CARE_PROVIDER_SITE_OTHER): Payer: PPO | Admitting: Cardiovascular Disease

## 2016-07-05 VITALS — BP 130/64 | HR 60 | Ht 68.0 in | Wt 184.0 lb

## 2016-07-05 DIAGNOSIS — Z510 Encounter for antineoplastic radiation therapy: Secondary | ICD-10-CM | POA: Diagnosis not present

## 2016-07-05 DIAGNOSIS — C060 Malignant neoplasm of cheek mucosa: Secondary | ICD-10-CM | POA: Diagnosis not present

## 2016-07-05 DIAGNOSIS — R609 Edema, unspecified: Secondary | ICD-10-CM

## 2016-07-05 MED ORDER — FUROSEMIDE 40 MG PO TABS: ORAL_TABLET | ORAL | 0 refills | Status: DC

## 2016-07-05 NOTE — Progress Notes (Signed)
Cardiology Office Note Date:  07/13/2016   ID:  EREK KOWAL, DOB Aug 16, 1940, MRN [id number]  PCP:  Janith Lima, MD  Cardiologist:  Sherren Mocha, MD    Chief Complaint  Patient presents with  . Leg Swelling    clarify isosorbide     History of Present Illness: Andres Smith is a 76 y.o. male who presents for evaluation of leg swelling. The patient has been previously followed for coronary artery disease with previous CABG, hypertension, hyperlipidemia, and type 2 diabetes. He is undergoing chemotherapy and radiation for buccal mucosa squamous cell carcinoma. The patient has developed progressive leg swelling and is brought in for further evaluation today. He was just seen in May after a presyncopal event. His antihypertensives were discontinued because of 20 pound weight loss and progressive general decline with chemotherapy, radiation, and worsening nutritional status. An echocardiogram demonstrated normal LV function.  The patient is here with his wife today. He has 3 more radiation treatment sessions remaining. He is not taking any oral medications and is using his enteral feeding. He needs to change from long-acting nitrate to a short acting preparation so that crushed. He has had no chest pain or anginal symptoms. He denies shortness of breath, orthopnea, or PND. His main complaint is leg swelling. He has developed progressive bilateral leg swelling left worse than right over the last 2 weeks. He was hospitalized with neutropenic fever a few weeks ago.  Past Medical History:  Diagnosis Date  . Ankylosing spondylitis ([place])   . Blood transfusion    hx of transfusion without reaction  . Bradycardia   . Cancer ([place]) 03/2016   oral  . Cholelithiasis   . Coronary artery disease    a. s/p CABG x 6 in 1997 (LIMA->LAD, VG->RI ->OM1->OM2, VG->PDA->PLV // b. 05/2011 Cath:  patent grafs, native prox rca and d2 dzs  ->med rx. // c. Myoview 1/18: not gated, large inferolateral scar, no  ischemia; Intermediate Risk (IL scar old - on prior studies >> med rx)  . Crohn's colitis ([place]) 12/01/1997  . Diabetes mellitus   . Dysphagia   . Fatty liver   . GERD (gastroesophageal reflux disease)   . Heart block   . Hiatal hernia 12/03/2008  . History of skin cancer   . Hx of echocardiogram    Echo 5/14:  Mild LVH, EF 55-60%, NL diast fxn, mild LAE   . Hyperlipidemia   . Hypertension   . Kidney stones   . Neck pain   . Pneumonia    hx of PNA  . 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 cancer removal  2014   removed from left outter leg  . COLONOSCOPY  2012  . CORONARY ARTERY BYPASS GRAFT  04/19/1995   x7  . EXCISION ORAL TUMOR Left    left jaw and lymph node  . HERNIA REPAIR  1989  . IR FLUORO GUIDE PORT INSERTION RIGHT  05/21/2016  . IR GASTROSTOMY TUBE MOD SED  05/21/2016  . IR US GUIDE VASC ACCESS RIGHT  05/21/2016  . LEFT HEART CATHETERIZATION WITH CORONARY/GRAFT ANGIOGRAM N/A 05/24/2011   Procedure: LEFT HEART CATHETERIZATION WITH Beatrix Fetters;  Surgeon: Larey Dresser, MD;  Location: [facility] C[facility] LAB;  Service: Cardiovascular;  Laterality: N/A;  . MOHS SURGERY     X 2 off chin and nose  . TONSILLECTOMY AND ADENOIDECTOMY  1944  . TRIGGER FINGER RELEASE    .  UMBILICAL HERNIA REPAIR      Current Outpatient Prescriptions  Medication Sig Dispense Refill  . aspirin EC 81 MG tablet Take 81 mg by mouth daily.    Marland Kitchen atorvastatin (LIPITOR) 80 MG tablet Take 80 mg by mouth at bedtime.    . balsalazide (COLAZAL) 750 MG capsule Take 3 capsules (2,250 mg total) by mouth 3 (three) times daily. 270 capsule 3  . cyanocobalamin (,VITAMIN B-12,) 1000 MCG/ML injection INJECT 1ML ONCE MONTHLY. 3 mL 1  . fentaNYL (DURAGESIC - DOSED MCG/HR) 12 MCG/HR Place 1 patch (12.5 mcg total) onto the skin every 3 (three) days. 5 patch 0  . magic mouthwash w/lidocaine SOLN Take 10 mLs by mouth 4 (four) times daily as  needed for mouth pain. 240 mL 0  . metFORMIN (GLUCOPHAGE) 500 MG tablet Place 1 tablet (500 mg total) into feeding tube 2 (two) times daily with a meal. 60 tablet 0  . nitroGLYCERIN (NITROSTAT) 0.4 MG SL tablet Place 0.4 mg under the tongue every 5 (five) minutes as needed for chest pain.    . Nutritional Supplements (FEEDING SUPPLEMENT, OSMOLITE 1.5 CAL,) LIQD Give 1 and 1/2 cans Osmolite 1.5 via PEG 4 times daily with 60 mL free water before and after bolus feedings. In addition, drink or flush PEG with 240 mL free water TID. Send formula and supplies. 6 Bottle 0  . Sennosides (SENEXON PO) Place 1 packet into feeding tube daily as needed (constipation).    . silver sulfADIAZINE (SILVADENE) 1 % cream Apply 1 application topically daily. Apply after rad txs and prn, nothing before ra dtxs daily, must remove with gentle washing with warm water and soap before re-applying to neck area    . furosemide (LASIX) 40 MG tablet Take one tablet by mouth daily for 3 days and then use as needed for swellling and 3lb weight gain in a 24 hour period 30 tablet 0  . isosorbide mononitrate (ISMO,MONOKET) 20 MG tablet Crush 2 tablets (40 mg total) by mouth 2 (two) times daily at 10 AM and 5 PM. (Patient not taking: Reported on 07/05/2016) 120 tablet 11   No current facility-administered medications for this visit.     Allergies:   Patient has no known allergies.   Social History:  The patient  reports that he quit smoking about 33 years ago. He quit smokeless tobacco use about 6 years ago. His smokeless tobacco use included Chew. He reports that he drinks alcohol. He reports that he does not use drugs.   Family History:  The patient's family history includes Arthritis in his mother; Cancer in his paternal grandmother; Crohn's disease in his unknown relative; Heart disease in his father, mother, sister, and unknown relative.   ROS:  Please see the history of present illness.  Otherwise, review of systems is positive  for visual changes, weight loss.  All other systems are reviewed and negative.   PHYSICAL EXAM: VS:  BP 130/64   Pulse 60   Ht 5\' 8"  (1.727 m)   Wt 184 lb (83.5 kg)   BMI 27.98 kg/m  , BMI Body mass index is 27.98 kg/m. GEN: Elderly male, in no acute distress  HEENT: normal  Neck: extensive skin changes of radiation therapy noted Cardiac: RRR without murmur or gallop                Respiratory:  clear to auscultation bilaterally, normal work of breathing GI: soft, nontender, nondistended, + BS MS: no deformity or atrophy  Ext:  1+ pedal and pretibial edema on the right, 2+ on the left Skin: warm and dry, no rash Neuro:  Strength and sensation are intact Psych: euthymic mood, full affect  EKG:  EKG is not ordered today.  Recent Labs: 06/06/2016: TSH 0.781 07/11/2016: ALT 9; BUN 19.8; Creatinine 1.0; HGB 10.2; Magnesium 2.2; Platelets 184; Potassium 4.9; Sodium 140   Lipid Panel     Component Value Date/Time   CHOL 136 05/16/2015 1621   TRIG 90.0 05/16/2015 1621   TRIG 54 12/07/2005 1117   HDL 58.10 05/16/2015 1621   CHOLHDL 2 05/16/2015 1621   VLDL 18.0 05/16/2015 1621   LDLCALC 60 05/16/2015 1621      Wt Readings from Last 3 Encounters:  07/11/16 183 lb 11.2 oz (83.3 kg)  07/05/16 184 lb (83.5 kg)  06/26/16 181 lb 14.1 oz (82.5 kg)     Cardiac Studies Reviewed: 2D Echo 06/26/2016: Impressions:  - Limited study with Definity-enhanced images. Even with Definity,   study is difficult. EF appears normal, estimate 55-60%. No   definite wall motion abnormalities noted.  Echo 06/07/2016: Study Conclusions  - Left ventricle: The cavity size was mildly dilated. Overall   systolic function appears preserved but cannot give accurate EF   or comment on wall motion as endocardial segments are not   adequately visualized. There was an increased relative   contribution of atrial contraction to ventricular filling.   Doppler parameters are consistent with abnormal left  ventricular   relaxation (grade 1 diastolic dysfunction). - Aortic valve: Trileaflet; normal thickness, moderately calcified   leaflets. Valve mobility was restricted. There was very mild   stenosis. Valve area (VTI): 1.73 cm^2. Valve area (Vmax): 1.66   cm^2. Valve area (Vmean): 1.62 cm^2. - Left atrium: The atrium was mildly dilated. - Pulmonic valve: Poorly visualized. - Pulmonary arteries: Systolic pressure could not be accurately   estimated. - Impressions: Due to poor acoustical windows, cannot comment   accurately on EF or on wall motion. Recommend repeat limited   study with definity contrast.  Impressions:  - Due to poor acoustical windows, cannot comment accurately on EF   or on wall motion. Recommend repeat limited study with definity   contrast.  ASSESSMENT AND PLAN: 1.  Leg swelling: Probably a combination of dependent edema, chronic venous insufficiency, and poor nutritional status. He doesn't have any other clear signs or symptoms of congestive heart failure. His echocardiogram is reviewed and is normal. I recommended furosemide 40 mg daily for 3 days. We discussed leg elevation and he is given information on the lounge doctor device for proper technique of elevating his legs.  2. Coronary artery disease, with angina: No recent symptoms. We will change his maintenance nitroglycerin preparation to isosorbide mononitrate so that he can crush it and use the feeding tube. He continues on low-dose aspirin.  Current medicines are reviewed with the patient today.  The patient does not have concerns regarding medicines.  Labs/ tests ordered today include:  No orders of the defined types were placed in this encounter.  Disposition:   FU as planned in November  Signed, Keoni Havey, MD  07/13/2016 4:14 AM    [facility] [address], [place], [place]  [address] Phone: [phone]; Fax: [phone]

## 2016-07-05 NOTE — Patient Instructions (Signed)
Medication Instructions:  Your physician has recommended you make the following change in your medication:  1. Furosemide 40mg  take one tablet by mouth daily for 3 days (crush medication). After the 3 days this medication can be used as needed for lower extremity edema or weight gain of 3 lbs in a 24 hour period 2. STOP Nitroglycerin patches 3. START Isosobide Mononitrate 20mg  (crush two tablets) and take 2 tablets twice a day  Labwork: No new orders.   Testing/Procedures: No new orders.   Follow-Up: Your physician recommends that you schedule a follow-up appointment in: November with Dr Burt Knack   Any Other Special Instructions Will Be Listed Below (If Applicable).  If you need a refill on your cardiac medications before your next appointment, please call your pharmacy.    For your  leg edema you  should do  the following 1. Leg elevation - I recommend the Lounge Dr. Leg rest.  See below for details  2. Salt restriction  -  Use potassium chloride instead of regular salt as a salt substitute. 3. Walk regularly   Go to [email or web address]

## 2016-07-05 NOTE — Telephone Encounter (Signed)
New Message:    Pt is having unusual swelling in his right leg.Wife would like for him to be seen this morning before he goes to the [facility] this afternoon please.Some of the pt's medicine have been changed,she said they need to talk about that also.

## 2016-07-05 NOTE — Telephone Encounter (Signed)
Pt seen by Dr Burt Knack in the office today. Medication issues addressed.

## 2016-07-05 NOTE — Telephone Encounter (Signed)
I spoke with the pt's wife and arranged an appointment for the pt to be seen today by Dr Burt Knack.  We will discuss clarification on Isosorbide during this appointment.

## 2016-07-06 ENCOUNTER — Ambulatory Visit: Payer: PPO

## 2016-07-06 ENCOUNTER — Ambulatory Visit
Admission: RE | Admit: 2016-07-06 | Discharge: 2016-07-06 | Disposition: A | Discharge status: Home / Self Care | Payer: PPO | Source: Ambulatory Visit | Attending: Radiation Oncology | Admitting: Radiation Oncology

## 2016-07-06 DIAGNOSIS — C060 Malignant neoplasm of cheek mucosa: Secondary | ICD-10-CM | POA: Diagnosis not present

## 2016-07-06 DIAGNOSIS — Z510 Encounter for antineoplastic radiation therapy: Secondary | ICD-10-CM | POA: Diagnosis not present

## 2016-07-09 ENCOUNTER — Encounter: Payer: Self-pay | Admitting: *Deleted

## 2016-07-09 ENCOUNTER — Ambulatory Visit
Admission: RE | Admit: 2016-07-09 | Discharge: 2016-07-09 | Disposition: A | Discharge status: Home / Self Care | Payer: PPO | Source: Ambulatory Visit | Attending: Radiation Oncology | Admitting: Radiation Oncology

## 2016-07-09 ENCOUNTER — Ambulatory Visit: Payer: PPO

## 2016-07-09 DIAGNOSIS — Z510 Encounter for antineoplastic radiation therapy: Secondary | ICD-10-CM | POA: Diagnosis not present

## 2016-07-09 DIAGNOSIS — C060 Malignant neoplasm of cheek mucosa: Secondary | ICD-10-CM | POA: Diagnosis not present

## 2016-07-09 NOTE — Progress Notes (Addendum)
Oncology Nurse Navigator Documentation  Met with Mr. Roza after final RT to offer support and to celebrate end of radiation treatment.  He was accompanied by his wife. I provided wife with a Certificate of Recognition for her supportive care. I provided verbal and written post-RT guidance:  Importance of keeping follow-up appts with Nutrition and SLP.  Importance of protecting treatment area from sun.  Continuation of Biafine application 2-3 times daily, transition to OTC lotion with vitamin E. I discussed the upcoming September [facility], encouraged their attendance.  They expressed interest, understand information to follow. I explained that my role as navigator will continue for several more months and that I will be calling and/or joining him during follow-up visits.   I encouraged him to call me with needs/concerns.   Patient and wife verbalized understanding of information provided.  Gayleen Orem, RN, BSN, [place] [facility] [place] [phone]

## 2016-07-10 ENCOUNTER — Ambulatory Visit: Payer: PPO

## 2016-07-11 ENCOUNTER — Telehealth: Payer: Self-pay | Admitting: Hematology and Oncology

## 2016-07-11 ENCOUNTER — Ambulatory Visit (HOSPITAL_BASED_OUTPATIENT_CLINIC_OR_DEPARTMENT_OTHER): Payer: PPO | Admitting: Hematology and Oncology

## 2016-07-11 ENCOUNTER — Ambulatory Visit: Payer: PPO

## 2016-07-11 ENCOUNTER — Other Ambulatory Visit (HOSPITAL_BASED_OUTPATIENT_CLINIC_OR_DEPARTMENT_OTHER): Payer: PPO

## 2016-07-11 DIAGNOSIS — C060 Malignant neoplasm of cheek mucosa: Secondary | ICD-10-CM

## 2016-07-11 DIAGNOSIS — R197 Diarrhea, unspecified: Secondary | ICD-10-CM | POA: Diagnosis not present

## 2016-07-11 DIAGNOSIS — K1231 Oral mucositis (ulcerative) due to antineoplastic therapy: Secondary | ICD-10-CM

## 2016-07-11 DIAGNOSIS — D638 Anemia in other chronic diseases classified elsewhere: Secondary | ICD-10-CM | POA: Diagnosis not present

## 2016-07-11 LAB — COMPREHENSIVE METABOLIC PANEL
ALT: 9 U/L (ref 0–55)
ANION GAP: 7 meq/L (ref 3–11)
AST: 15 U/L (ref 5–34)
Albumin: 3.2 g/dL — ABNORMAL LOW (ref 3.5–5.0)
Alkaline Phosphatase: 57 U/L (ref 40–150)
BUN: 19.8 mg/dL (ref 7.0–26.0)
CHLORIDE: 101 meq/L (ref 98–109)
CO2: 32 meq/L — AB (ref 22–29)
Calcium: 9.3 mg/dL (ref 8.4–10.4)
Creatinine: 1 mg/dL (ref 0.7–1.3)
EGFR: 74 mL/min/{1.73_m2} — ABNORMAL LOW (ref >90)
Glucose: 208 mg/dl — ABNORMAL HIGH (ref 70–140)
POTASSIUM: 4.9 meq/L (ref 3.5–5.1)
Sodium: 140 mEq/L (ref 136–145)
Total Bilirubin: 0.41 mg/dL (ref 0.20–1.20)
Total Protein: 6.2 g/dL — ABNORMAL LOW (ref 6.4–8.3)

## 2016-07-11 LAB — CBC WITH DIFFERENTIAL/PLATELET
BASO%: 0.6 % (ref 0.0–2.0)
BASOS ABS: 0 10*3/uL (ref 0.0–0.1)
EOS ABS: 0.1 10*3/uL (ref 0.0–0.5)
EOS%: 1.5 % (ref 0.0–7.0)
HEMATOCRIT: 29.9 % — AB (ref 38.4–49.9)
HEMOGLOBIN: 10.2 g/dL — AB (ref 13.0–17.1)
LYMPH%: 8.8 % — ABNORMAL LOW (ref 14.0–49.0)
MCH: 34.3 pg — AB (ref 27.2–33.4)
MCHC: 34.2 g/dL (ref 32.0–36.0)
MCV: 100.3 fL — AB (ref 79.3–98.0)
MONO#: 0.6 10*3/uL (ref 0.1–0.9)
MONO%: 13 % (ref 0.0–14.0)
NEUT%: 76.1 % — ABNORMAL HIGH (ref 39.0–75.0)
NEUTROS ABS: 3.2 10*3/uL (ref 1.5–6.5)
PLATELETS: 184 10*3/uL (ref 140–400)
RBC: 2.98 10*6/uL — ABNORMAL LOW (ref 4.20–5.82)
RDW: 17.1 % — AB (ref 11.0–14.6)
WBC: 4.2 10*3/uL (ref 4.0–10.3)
lymph#: 0.4 10*3/uL — ABNORMAL LOW (ref 0.9–3.3)

## 2016-07-11 LAB — MAGNESIUM: MAGNESIUM: 2.2 mg/dL (ref 1.5–2.5)

## 2016-07-11 NOTE — Telephone Encounter (Signed)
Gave patient avs report and appointments for July.  °

## 2016-07-12 ENCOUNTER — Ambulatory Visit: Payer: PPO

## 2016-07-13 ENCOUNTER — Encounter: Payer: Self-pay | Admitting: Radiation Oncology

## 2016-07-13 ENCOUNTER — Encounter: Payer: Self-pay | Admitting: Hematology and Oncology

## 2016-07-13 DIAGNOSIS — R197 Diarrhea, unspecified: Secondary | ICD-10-CM | POA: Insufficient documentation

## 2016-07-13 DIAGNOSIS — D638 Anemia in other chronic diseases classified elsewhere: Secondary | ICD-10-CM | POA: Insufficient documentation

## 2016-07-13 NOTE — Assessment & Plan Note (Signed)
He has completed all treatment and appears to be recovering well from side effects of treatment I will space out his appointment and will continue to provide supportive care  

## 2016-07-13 NOTE — Assessment & Plan Note (Signed)
This is likely anemia of chronic disease. The patient denies recent history of bleeding such as epistaxis, hematuria or hematochezia. He is asymptomatic from the anemia. We will observe for now.  

## 2016-07-13 NOTE — Assessment & Plan Note (Signed)
I have a long discussion with the patient and his wife regarding chronic pain management. He has minimum pain I will initiate pain medicine taper

## 2016-07-13 NOTE — Progress Notes (Signed)
  Radiation Oncology         [phone] ________________________________  Name: Andres Smith MRN: [id number]  Date: 07/13/2016  DOB: [birth year]  End of Treatment Note  Diagnosis:   Pathologic Stage IVA (pT2, pN2a, cM0) buccal mucosa squamous cell carcinoma    Indication for treatment:  Curative       Radiation treatment dates:   05/23/16 - 07/09/16  Site/dose:     Left Cheek and bilateral neck / 60 Gy in 30 fractions to gross disease, 54 Gy in 30 fractions to intermediate risk nodal echelons  Beams/energy:   IMRT / 6 MV photons  Narrative: The patient tolerated radiation treatment relatively well. The patient reported thick secretions throughout treatment. He experienced radiation related skin changes including early moist desquamation in the low left neck and moist desquamation over his left lips.  Plan: The patient has completed radiation treatment. The patient was advised to use Neosporin to areas of peeling and open skin, and to continue to use Biafine to all other intact areas within the treatment field. The patient will return to radiation oncology clinic for routine followup in one half month. I advised the patient to call or return sooner if any questions or concerns arise that are related to recovery or treatment.  -----------------------------------  [name], MD  This document serves as a record of services personally performed by [name], MD. It was created on her behalf by Maryla Morrow, a trained medical scribe. The creation of this record is based on the scribe's personal observations and the provider's statements to them. This document has been checked and approved by the attending provider.

## 2016-07-13 NOTE — Progress Notes (Signed)
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 Tumor measures 0.9 cm in greatest dimension. Tumor extends to the inked deep tissue edge. Depth of invasion: 4 mm (in this material) See note.  Note: an immunostains for P16 was performed at an outside hospital and provided for [place] to review; p16 immunostain is negative. Per outside report, In situ hybridization for high-risk HPV types showed no evidence of transcriptionally active high-risk HPV types.      03/21/2016 Initial Diagnosis    Salient findings:  -EAC's clear -Lesion within left buccal mucosa (pictured below) with about 46mm depth. Width is about 2/3 of the left buccal mucosal surface area by palpation. Extends to the oral commissure.  -Tongue soft and mobile without mucosal lesions -Neck soft with no palpable neck masses -Right handed -Right hand is pink and warm with Allen's test negative (normal)  -Left forearm very sun damaged with multiple treated areas from precancerous or cancerous areas. Right forearm not as dramatic, presumably from sun exposure while driving      0/93/2355 Surgery    RESECTION OF ORAL BUCCAL MUCOSA SELECTIVE NECK DISSECTION FREE FLAP RADIAL FOREARM PR SPLIT [place] <100 SQCM [15100] (SPLIT THICKNESS SKIN GRAFT LEG        04/13/2016 Pathology Results    A.LEFT ORAL COMMISSURE INFERIOR, EXCISION: No malignancy identified.  B.LEFT ORAL COMMISSURE SUPERIOR, EXCISION: No malignancy identified.  C.LEFT DEEP ORAL COMMISSURE, EXCISION: No malignancy identified.  D.RIGHT POSTERIOR BUCCAL INFERIOR, EXCISION: No malignancy identified.  E.RIGHT POSTERIOR BUCCAL SUPERIOR, EXCISION: No malignancy identified.  F.ORAL CAVITY, LEFT BUCCAL, EXCISION:  Invasive squamous cell carcinoma, moderately differentiated,  keratinizing. Tumor size:2.2 cm. Tumor depth of invasion:0.8 cm. Perineural invasion identified. Margins negative for malignancy. Pathologic stage:pT2 pN2a. See tumor protocol summary below.  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 Tumor deposit size:1.1 cm. Extranodal extension present. Benign salivary gland tissue.  SURGICAL PATHOLOGY CANCER CASE SUMMARY (AJCC 8TH EDITION) LIP AND ORAL CAVITY CAP Protocol posting date: June, 2017 Version: LipOralCavity 4.0.0.0  PROCEDURE: Wide resection of buccal mucosa TUMOR SITE:Oral:Buccal mucosa TUMOR LATERALITY: Left TUMOR FOCALITY: Unifocal TUMOR SIZE:Greatest dimension: 2.2 cm TUMOR DEPTH OF INVASION (DOI):8 mm HISTOLOGIC TYPE:Squamous cell carcinoma, conventional HISTOLOGIC GRADE:G2: Moderately differentiated SPECIMEN MARGINS:Uninvolved by invasive carcinoma  DISTANCE FROM CLOSEST MARGIN:1 mm  SPECIFY MARGIN(S):Deep LYMPHOVASCULAR INVASION:Present PERINEURAL INVASION: Present REGIONAL LYMPH NODES: NUMBER OF LYMPH NODES INVOLVED: 1 NUMBER OF LYMPH NODES EXAMINED: 4 LATERALITY OF LYMPH NODES INVOLVED: Ipsilateral SIZE OF LARGEST METASTATIC DEPOSIT:1.1 cm EXTRANODAL EXTENSION:Present PATHOLOGIC STAGE CLASSIFICATION (pTNM, AJCC 8th  Ed): pT2 pN2a pT2: umor >2 cm but <=4 cm and <=10 mm DOI pN2a:Metastasis in a single ipsilateral lymph node, 3 cm or  smaller in greatest dimension and ENE(+) BIOMARKERS:performed on prior outside biopsy ([place] review case [id number], outside case [id number]) P16 (Immunohistochemistry):Negative HPV (high-risk types by in situ hybridization):Reported as negative      05/11/2016 Imaging    1. 4  mm left lower lobe pulmonary nodule. Attention on follow-up imaging recommended as metastatic disease not excluded. 2. 2.8 cm left thyroid nodule. Thyroid ultrasound recommended to further evaluate. 3. Bilateral renal cysts. 4. Coronary artery and thoracoabdominal aortic atherosclerosis. 5. Ankylosing spondylitis.        05/11/2016 Imaging    CT neck: Surgical clips in the region of the left buccal mucosa, presumably at the previous primary site. Mild scarring in that region measuring about 8 mm. Cannot assess for residual or recurrent disease in that location, but this may simply be scarring. Previous left submandibular resection and left neck node dissection. No abnormal nodes presently. Multinodular goiter. Ankylosing spondylitis.  C1-2 remains a mobile segment.      05/21/2016 Procedure    1. Successful placement of a right internal jugular approach power injectable Port-A-Cath. The Port a catheter is ready for immediate use. 2. Successful fluoroscopic insertion of a 20-French pull-through gastrostomy tube. The gastrostomy may be used immediately for medication administration and may be utilized in 24 hrs for the initiation of feeds.      05/23/2016 - 07/09/2016 Radiation Therapy    He received radiation treatment       05/25/2016 - 06/15/2016 Chemotherapy    He received weekly reduced dose cisplatin       06/21/2016 Adverse Reaction    Cycle 5 is delayed due to pancytopenia      06/26/2016 - 06/29/2016 Hospital Admission    He was admitted for management of neutropenic  fever       INTERVAL HISTORY: Please see below for problem oriented charting. He is feeling better He is able to tolerate almost 6 cans of nutritional supplement He denies pain He is able to tolerate reduced fentanyl patch and rarely uses pain medicine He denies constipation.  In fact, he had recent diarrhea No recent fever or chills  REVIEW OF SYSTEMS:   Constitutional: Denies fevers, chills or abnormal weight loss Eyes: Denies blurriness of vision Respiratory: Denies cough, dyspnea or wheezes Cardiovascular: Denies palpitation, chest discomfort or lower extremity swelling Skin: Denies abnormal skin rashes Lymphatics: Denies new lymphadenopathy or easy bruising Neurological:Denies numbness, tingling or new weaknesses Behavioral/Psych: Mood is stable, no new changes  All other systems were reviewed with the patient and are negative.  I have reviewed the past medical history, past surgical history, social history and family history with the patient and they are unchanged from previous note.  ALLERGIES:  has No Known Allergies.  MEDICATIONS:  Current Outpatient Prescriptions  Medication Sig Dispense Refill  . aspirin EC 81 MG tablet Take 81 mg by mouth daily.    Marland Kitchen atorvastatin (LIPITOR) 80 MG tablet Take 80 mg by mouth at bedtime.    . balsalazide (COLAZAL) 750 MG capsule Take 3 capsules (2,250 mg total) by mouth 3 (three) times daily. 270 capsule 3  . cyanocobalamin (,VITAMIN B-12,) 1000 MCG/ML injection INJECT 1ML ONCE MONTHLY. 3 mL 1  . fentaNYL (DURAGESIC - DOSED MCG/HR) 12 MCG/HR Place 1 patch (12.5 mcg total) onto the skin every 3 (three) days. 5 patch 0  . furosemide (LASIX) 40 MG tablet Take one tablet by mouth daily for 3 days and then use as needed for swellling and 3lb weight gain in a 24 hour period 30 tablet 0  . isosorbide mononitrate (ISMO,MONOKET) 20 MG tablet Crush 2 tablets (40 mg total) by mouth 2 (two) times daily at 10 AM and 5 PM. (Patient not taking:  Reported on 07/05/2016) 120 tablet 11  .  magic mouthwash w/lidocaine SOLN Take 10 mLs by mouth 4 (four) times daily as needed for mouth pain. 240 mL 0  . metFORMIN (GLUCOPHAGE) 500 MG tablet Place 1 tablet (500 mg total) into feeding tube 2 (two) times daily with a meal. 60 tablet 0  . nitroGLYCERIN (NITROSTAT) 0.4 MG SL tablet Place 0.4 mg under the tongue every 5 (five) minutes as needed for chest pain.    . Nutritional Supplements (FEEDING SUPPLEMENT, OSMOLITE 1.5 CAL,) LIQD Give 1 and 1/2 cans Osmolite 1.5 via PEG 4 times daily with 60 mL free water before and after bolus feedings. In addition, drink or flush PEG with 240 mL free water TID. Send formula and supplies. 6 Bottle 0  . Sennosides (SENEXON PO) Place 1 packet into feeding tube daily as needed (constipation).    . silver sulfADIAZINE (SILVADENE) 1 % cream Apply 1 application topically daily. Apply after rad txs and prn, nothing before ra dtxs daily, must remove with gentle washing with warm water and soap before re-applying to neck area     No current facility-administered medications for this visit.     PHYSICAL EXAMINATION: ECOG PERFORMANCE STATUS: 1 - Symptomatic but completely ambulatory  Vitals:   07/11/16 0959  BP: 121/61  Pulse: 66  Resp: 17  Temp: 98.6 F (37 C)   Filed Weights   07/11/16 0959  Weight: 183 lb 11.2 oz (83.3 kg)    GENERAL:alert, no distress and comfortable SKIN: skin color, texture, turgor are normal, no rashes or significant lesions EYES: normal, Conjunctiva are pink and non-injected, sclera clear OROPHARYNX:no exudate, no erythema and lips, buccal mucosa, and tongue normal  NECK: supple, thyroid normal size, non-tender, without nodularity LYMPH:  no palpable lymphadenopathy in the cervical, axillary or inguinal LUNGS: clear to auscultation and percussion with normal breathing effort HEART: regular rate & rhythm and no murmurs and no lower extremity edema ABDOMEN:abdomen soft, non-tender and  normal bowel sounds Musculoskeletal:no cyanosis of digits and no clubbing  NEURO: alert & oriented x 3 with fluent speech, no focal motor/sensory deficits  LABORATORY DATA:  I have reviewed the data as listed    Component Value Date/Time   NA 140 07/11/2016 0950   K 4.9 07/11/2016 0950   CL 100 (L) 06/29/2016 0454   CO2 32 (H) 07/11/2016 0950   GLUCOSE 208 (H) 07/11/2016 0950   GLUCOSE 98 12/12/2005 0729   BUN 19.8 07/11/2016 0950   CREATININE 1.0 07/11/2016 0950   CALCIUM 9.3 07/11/2016 0950   PROT 6.2 (L) 07/11/2016 0950   ALBUMIN 3.2 (L) 07/11/2016 0950   AST 15 07/11/2016 0950   ALT 9 07/11/2016 0950   ALKPHOS 57 07/11/2016 0950   BILITOT 0.41 07/11/2016 0950   GFRNONAA >60 06/29/2016 0454   GFRAA >60 06/29/2016 0454    No results found for: SPEP, UPEP  Lab Results  Component Value Date   WBC 4.2 07/11/2016   NEUTROABS 3.2 07/11/2016   HGB 10.2 (L) 07/11/2016   HCT 29.9 (L) 07/11/2016   MCV 100.3 (H) 07/11/2016   PLT 184 07/11/2016      Chemistry      Component Value Date/Time   NA 140 07/11/2016 0950   K 4.9 07/11/2016 0950   CL 100 (L) 06/29/2016 0454   CO2 32 (H) 07/11/2016 0950   BUN 19.8 07/11/2016 0950   CREATININE 1.0 07/11/2016 0950      Component Value Date/Time   CALCIUM 9.3 07/11/2016 0950   ALKPHOS 57 07/11/2016 0950  AST 15 07/11/2016 0950   ALT 9 07/11/2016 0950   BILITOT 0.41 07/11/2016 0950       RADIOGRAPHIC STUDIES: I have personally reviewed the radiological images as listed and agreed with the findings in the report. Dg Chest 2 View  Result Date: 06/26/2016 CLINICAL DATA:  Patient with abnormal vital signs. Recent chemotherapy. EXAM: CHEST  2 VIEW COMPARISON:  Chest radiograph 05/22/2016. FINDINGS: Right anterior chest wall Port-A-Cath is present with tip projecting over the superior vena cava. Patient status post median sternotomy. Stable cardiac and mediastinal contours. Low lung volumes. No large area pulmonary consolidation.  No pleural effusion or pneumothorax. Thoracic spine degenerative changes. IMPRESSION: Low lung volumes.  No acute cardiopulmonary process. Electronically Signed   By: Lovey Newcomer M.D.   On: 06/26/2016 18:27   Ct Head Wo Contrast  Result Date: 06/26/2016 CLINICAL DATA:  Foot drop and fever. EXAM: CT HEAD WITHOUT CONTRAST TECHNIQUE: Contiguous axial images were obtained from the base of the skull through the vertex without intravenous contrast. COMPARISON:  None. FINDINGS: Brain: There is no evidence for acute hemorrhage, hydrocephalus, mass lesion, or abnormal extra-axial fluid collection. No definite CT evidence for acute infarction. Diffuse loss of parenchymal volume is consistent with atrophy. Patchy low attenuation in the deep hemispheric and periventricular white matter is nonspecific, but likely reflects chronic microvascular ischemic demyelination. 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 Vascular: No hyperdense vessel or unexpected calcification. Skull: No evidence for fracture. No worrisome lytic or sclerotic lesion. Sinuses/Orbits: The visualized paranasal sinuses and mastoid air cells are clear. Visualized portions of the globes and intraorbital fat are unremarkable. Other: None. IMPRESSION: 1. No acute intracranial abnormality 2. Atrophy with chronic small vessel white matter ischemic disease. 3. Lacunar infarct left cerebellar hemisphere. Electronically Signed   By: Misty Stanley M.D.   On: 06/26/2016 20:42    ASSESSMENT & PLAN:  Buccal mucosa squamous cell carcinoma (HCC) He has completed all treatment and appears to be recovering well from side effects of treatment I will space out his appointment and will continue to provide supportive care   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 He has minimum pain I will initiate pain medicine taper  Diarrhea The patient had history of chronic constipation and now  had diarrhea I recommend discontinuation of laxative and regular Imodium  Anemia, chronic disease This is likely anemia of chronic disease. The patient denies recent history of bleeding such as epistaxis, hematuria or hematochezia. He is asymptomatic from the anemia. We will observe for now.    No orders of the defined types were placed in this encounter.  All questions were answered. The patient knows to call the clinic with any problems, questions or concerns. No barriers to learning was detected. I spent 15 minutes counseling the patient face to face. The total time spent in the appointment was 20 minutes and more than 50% was on counseling and review of test results     [name], MD 07/13/2016 3:23 PM

## 2016-07-13 NOTE — Assessment & Plan Note (Signed)
The patient had history of chronic constipation and now had diarrhea I recommend discontinuation of laxative and regular Imodium

## 2016-07-16 ENCOUNTER — Telehealth: Payer: Self-pay | Admitting: *Deleted

## 2016-07-16 NOTE — Telephone Encounter (Addendum)
Oncology Nurse Navigator Documentation  Received call from patient wife:  Andres Smith about next appt with SLP.  She understands I will contact scheduler.  Reported new onset last week of  "dry, horrible cough".  She indicated she has encouraged him to drink water which he minimally complies,  suck on throat lozenges which he has refused.  I recommended she encourage increase sips of water, suck on ice chips and non-mentholated cough drops for relief.  She further noted thick saliva has almost completely resolved, he does not choke or show S/S of aspiration with eating or drinking. She understands I will contact SLP scheduler for appt follow-up. I recommended she encourage him to increase sips of water, suck on ice chips and non-mentholated cough drops to address cough.  I asked her to call me if suggestions do not helpl.  Gayleen Orem, RN, BSN, [place] [facility] Oncology Nurse [place] [facility] [place] [facility] [phone]

## 2016-07-17 ENCOUNTER — Encounter: Payer: Self-pay | Admitting: Radiation Oncology

## 2016-07-17 ENCOUNTER — Telehealth: Payer: Self-pay | Admitting: Internal Medicine

## 2016-07-17 NOTE — Progress Notes (Signed)
Andres Smith presents for follow up of radiation completed 07/09/16 to his Left Cheek.   Pain issues, if any: He reports soreness to the left side of his tongue to touch and when drinking liquids.  Using a feeding tube?: 4 1/2 cans of osmolite daily.  Weight changes, if any: He has lost about 3 lbs since 07/09/16 07/02/16 186.4 lbs 07/09/16 183.4 lbs 07/24/16 179.8 lb  Swallowing issues, if any: When he first finished radiation he was swallow well. Starting this past Sunday he was unable to pass liquids or foods down his throat. He tells me it hurts, then starts gagging him.  Smoking or chewing tobacco? No Using fluoride trays daily? No Last ENT visit was on: Dr. [name] tomorrow 07[facility]25/[facility] Other notable issues, if any:  His radiation site has improved. He is no longer using silvadene. I have encouraged him to begin using vitamin E cream to his radiation site.   BP 117/75   Pulse 70   Temp 97.7 F (36.5 C)   Ht 5\' 8"  (1.727 m)   Wt 179 lb 12.8 oz (81.6 kg)   SpO2 100% Comment: room air  BMI 27.34 kg/m

## 2016-07-17 NOTE — Telephone Encounter (Signed)
Patients wife has been advised of upcoming appointment on 07/26/16. She verbalizes understanding.

## 2016-07-17 NOTE — Telephone Encounter (Signed)
I was not able to find a note or anything regarding a call. Spouse says after radiation he will not talk on the phone but she will be glad to speak to you.

## 2016-07-19 ENCOUNTER — Ambulatory Visit: Payer: PPO | Attending: Radiation Oncology

## 2016-07-19 DIAGNOSIS — R1310 Dysphagia, unspecified: Secondary | ICD-10-CM | POA: Insufficient documentation

## 2016-07-19 NOTE — Therapy (Signed)
[facility] [address] [place], [place], [address] Phone: [phone]   Fax:  [phone]  Speech Language Pathology Treatment  Patient Details  Name: Andres Smith MRN: [id number] Date of Birth: 1940-11-06 Referring Provider: [name], MD  Encounter Date: 07/19/2016      End of Session - 07/19/16 1625    Visit Number 2   Number of Visits 3   Date for SLP Re-Evaluation 08/24/16   SLP Start Time 1532   SLP Stop Time  1610   SLP Time Calculation (min) 38 min   Activity Tolerance Patient tolerated treatment well      Past Medical History:  Diagnosis Date  . Ankylosing spondylitis ([place])   . Blood transfusion    hx of transfusion without reaction  . Bradycardia   . Cancer ([place]) 03/2016   oral  . Cholelithiasis   . Coronary artery disease    a. s/p CABG x 6 in 1997 (LIMA->LAD, VG->RI ->OM1->OM2, VG->PDA->PLV // b. 05/2011 Cath:  patent grafs, native prox rca and d2 dzs  ->med rx. // c. Myoview 1/18: not gated, large inferolateral scar, no ischemia; Intermediate Risk (IL scar old - on prior studies >> med rx)  . Crohn's colitis ([place]) 12/01/1997  . Diabetes mellitus   . Dysphagia   . Fatty liver   . GERD (gastroesophageal reflux disease)   . Heart block   . Hiatal hernia 12/03/2008  . History of radiation therapy 05/23/16- 07/09/16   Left Cheek/ 60 Gy in 30 fractions to gross disease, 63 Gy in 35 fractions to high risk nodal echelons, and 56 Gy in 35 fractions to intermedicate risk nodal echelons.   . History of skin cancer   . Hx of echocardiogram    Echo 5/14:  Mild LVH, EF 55-60%, NL diast fxn, mild LAE   . Hyperlipidemia   . Hypertension   . Kidney stones   . Neck pain   . Pneumonia    hx of PNA  . Squamous cell cancer of buccal mucosa ([place])   . 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 cancer removal  2014   removed from left  outter leg  . COLONOSCOPY  2012  . CORONARY ARTERY BYPASS GRAFT  04/19/1995   x7  . EXCISION ORAL TUMOR Left    left jaw and lymph node  . HERNIA REPAIR  1989  . IR FLUORO GUIDE PORT INSERTION RIGHT  05/21/2016  . IR GASTROSTOMY TUBE MOD SED  05/21/2016  . IR US GUIDE VASC ACCESS RIGHT  05/21/2016  . LEFT HEART CATHETERIZATION WITH CORONARY/GRAFT ANGIOGRAM N/A 05/24/2011   Procedure: LEFT HEART CATHETERIZATION WITH Beatrix Fetters;  Surgeon: Larey Dresser, MD;  Location: [facility] CATH LAB;  Service: Cardiovascular;  Laterality: N/A;  . MOHS SURGERY     X 2 off chin and nose  . TONSILLECTOMY AND ADENOIDECTOMY  1944  . TRIGGER FINGER RELEASE    . UMBILICAL HERNIA REPAIR      There were no vitals filed for this visit.      Subjective Assessment - 07/19/16 1544    Subjective "That thick stuff (saliva) is not near as bad now."   Currently in Pain? No/denies               ADULT SLP TREATMENT - 07/19/16 1545      General Information   Behavior/Cognition Alert;Cooperative;Pleasant mood     Treatment  Provided   Treatment provided Dysphagia     Dysphagia Treatment   Temperature Spikes Noted No   Treatment Methods Skilled observation;Therapeutic exercise;Patient/caregiver education   Patient observed directly with PO's Yes   Type of PO's observed Dysphagia 1 (puree);Thin liquids   Liquids provided via Cup   Oral Phase Signs & Symptoms --  none   Pharyngeal Phase Signs & Symptoms Complaints of residue   Other treatment/comments Pt with 1/2 to 3/4 teaspoons applesauce with complaints of mild residue in pharynx. Liquid wash helped to clear and SLP suggested pt do this. HEP completed at suboptimal frequency so SLP encouraged pt strongly to incr frequency/reps and asked pt rationale. SLP supplied pt with rationale. Educated pt re: food journal and he provided benefits 7 minutes later. Pt req'd min A occasionally for procedure for HEP.          SLP Education - 07/19/16 1624     Education provided Yes   Education Details HEP, late effects head/neck radiation on swalowing   Person(s) Educated Patient;Spouse   Methods Explanation;Demonstration   Comprehension Verbalized understanding;Returned demonstration;Verbal cues required;Need further instruction          SLP Short Term Goals - 07/19/16 1627      SLP SHORT TERM GOAL #1   Title pt will complete HEP with min A    Time --   Period --   Status Achieved  occasionally     SLP SHORT TERM GOAL #2   Title pt will tell SLP why he is completing HEP   Time --   Period --   Status Not Met          SLP Long Term Goals - 07/19/16 1628      SLP LONG TERM GOAL #1   Title pt will complete HEP with modified independence   Time 1   Period --  visits   Status On-going     SLP LONG TERM GOAL #2   Title pt will tell SLP 3 overt s/s aspiration PNA with modified independence   Time 1   Period --  visits   Status On-going     SLP LONG TERM GOAL #3   Title pt will tell SLP why a food journal is helpful in returning to most liberal diet   Status Achieved          Plan - 07/19/16 1625    Clinical Impression Statement Pt with oropharyngeal swallowing WFL for dys I items and water. The probability of swallowing difficulty remains high after completion of chemo and radiation therapy. Pt will need to be followed by SLP for regular assessment of accurate HEP completion as well as for safety with POs both during and following treatment/s.   Speech Therapy Frequency --  approx once every 4 weeks   Duration --  3 visits   Treatment/Interventions Aspiration precaution training;Pharyngeal strengthening exercises;Diet toleration management by SLP;Compensatory techniques;Functional tasks;Patient/family education;SLP instruction and feedback   Potential to [place] provided today   Consulted and Agree with Plan of Care Patient      Patient will benefit from skilled therapeutic  intervention in order to improve the following deficits and impairments:   Dysphagia, unspecified type    Problem List Patient Active Problem List   Diagnosis Date Noted  . Diarrhea 07/13/2016  . Anemia, chronic disease 07/13/2016  . Protein-calorie malnutrition, severe 06/27/2016  . Neutropenic fever ([place]) 06/26/2016  . Fever 06/26/2016  .  Acquired pancytopenia ([place]) 06/14/2016  . Mucositis due to chemotherapy 06/07/2016  . Pressure injury of skin, stage 1 06/07/2016  . Other constipation 06/07/2016  . Oral thrush 06/07/2016  . Acute hyperkalemia 05/31/2016  . Weight loss 05/31/2016  . 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 B12 deficiency anemia 08/03/2013  . Ulcerative colitis ([place]) 01/17/2010  . Occlusion and stenosis of carotid artery without mention of cerebral infarction 09/20/2009  . GERD 12/02/2008  . Type II diabetes mellitus with manifestations ([place]) 04/27/2008  . Hyperlipidemia with target LDL less than 70 12/04/2007  . Essential hypertension, benign 12/04/2007  . Ankylosing spondylitis ([place]) 12/04/2007  . Coronary atherosclerosis of native coronary artery 12/04/2007  . [facility] INTESTINE 05/06/2007    [facility] ,[place], [place]  07/19/2016, 4:29 PM  [facility] [address] [place], [place], [address] Phone: [phone]   Fax:  [phone]   Name: DIGBY GROENEVELD MRN: [id number] Date of Birth: 05/19/1940

## 2016-07-19 NOTE — Patient Instructions (Signed)
Increase your frequency and repetitions of the exercises! Keep a journal of the foods you eat with amount, ease of passage, and taste. Look back every 1-2 weeks and try something familiar.

## 2016-07-24 ENCOUNTER — Other Ambulatory Visit (HOSPITAL_BASED_OUTPATIENT_CLINIC_OR_DEPARTMENT_OTHER): Payer: PPO

## 2016-07-24 ENCOUNTER — Ambulatory Visit (HOSPITAL_BASED_OUTPATIENT_CLINIC_OR_DEPARTMENT_OTHER): Payer: PPO | Admitting: Hematology and Oncology

## 2016-07-24 ENCOUNTER — Ambulatory Visit
Admission: RE | Admit: 2016-07-24 | Discharge: 2016-07-24 | Disposition: A | Discharge status: Home / Self Care | Payer: PPO | Source: Ambulatory Visit | Attending: Radiation Oncology | Admitting: Radiation Oncology

## 2016-07-24 ENCOUNTER — Telehealth: Payer: Self-pay | Admitting: Hematology and Oncology

## 2016-07-24 ENCOUNTER — Telehealth: Payer: Self-pay | Admitting: *Deleted

## 2016-07-24 ENCOUNTER — Encounter: Payer: Self-pay | Admitting: Hematology and Oncology

## 2016-07-24 ENCOUNTER — Encounter: Payer: Self-pay | Admitting: Radiation Oncology

## 2016-07-24 DIAGNOSIS — E875 Hyperkalemia: Secondary | ICD-10-CM | POA: Diagnosis not present

## 2016-07-24 DIAGNOSIS — D61818 Other pancytopenia: Secondary | ICD-10-CM

## 2016-07-24 DIAGNOSIS — C060 Malignant neoplasm of cheek mucosa: Secondary | ICD-10-CM

## 2016-07-24 DIAGNOSIS — R1310 Dysphagia, unspecified: Secondary | ICD-10-CM | POA: Diagnosis not present

## 2016-07-24 DIAGNOSIS — Z510 Encounter for antineoplastic radiation therapy: Secondary | ICD-10-CM | POA: Diagnosis not present

## 2016-07-24 DIAGNOSIS — R1319 Other dysphagia: Secondary | ICD-10-CM

## 2016-07-24 HISTORY — DX: Personal history of irradiation: Z92.3

## 2016-07-24 HISTORY — DX: Malignant neoplasm of cheek mucosa: C06.0

## 2016-07-24 LAB — CBC WITH DIFFERENTIAL/PLATELET
BASO%: 0.6 % (ref 0.0–2.0)
BASOS ABS: 0 10*3/uL (ref 0.0–0.1)
EOS%: 2.8 % (ref 0.0–7.0)
Eosinophils Absolute: 0.1 10*3/uL (ref 0.0–0.5)
HEMATOCRIT: 33.4 % — AB (ref 38.4–49.9)
HEMOGLOBIN: 11.1 g/dL — AB (ref 13.0–17.1)
LYMPH#: 0.6 10*3/uL — AB (ref 0.9–3.3)
LYMPH%: 13.8 % — ABNORMAL LOW (ref 14.0–49.0)
MCH: 33.3 pg (ref 27.2–33.4)
MCHC: 33.2 g/dL (ref 32.0–36.0)
MCV: 100.2 fL — ABNORMAL HIGH (ref 79.3–98.0)
MONO#: 0.6 10*3/uL (ref 0.1–0.9)
MONO%: 14.3 % — AB (ref 0.0–14.0)
NEUT%: 68.5 % (ref 39.0–75.0)
NEUTROS ABS: 3.1 10*3/uL (ref 1.5–6.5)
Platelets: 99 10*3/uL — ABNORMAL LOW (ref 140–400)
RBC: 3.34 10*6/uL — ABNORMAL LOW (ref 4.20–5.82)
RDW: 16.6 % — AB (ref 11.0–14.6)
WBC: 4.5 10*3/uL (ref 4.0–10.3)

## 2016-07-24 LAB — COMPREHENSIVE METABOLIC PANEL
ALK PHOS: 68 U/L (ref 40–150)
ALT: 7 U/L (ref 0–55)
ANION GAP: 8 meq/L (ref 3–11)
AST: 17 U/L (ref 5–34)
Albumin: 3.5 g/dL (ref 3.5–5.0)
BILIRUBIN TOTAL: 0.48 mg/dL (ref 0.20–1.20)
BUN: 23 mg/dL (ref 7.0–26.0)
CALCIUM: 9.5 mg/dL (ref 8.4–10.4)
CO2: 30 meq/L — AB (ref 22–29)
CREATININE: 1 mg/dL (ref 0.7–1.3)
Chloride: 103 mEq/L (ref 98–109)
EGFR: 71 mL/min/{1.73_m2} — AB (ref >90)
Glucose: 173 mg/dl — ABNORMAL HIGH (ref 70–140)
Potassium: 5.3 mEq/L — ABNORMAL HIGH (ref 3.5–5.1)
Sodium: 141 mEq/L (ref 136–145)
TOTAL PROTEIN: 6.6 g/dL (ref 6.4–8.3)

## 2016-07-24 LAB — MAGNESIUM: MAGNESIUM: 2.1 mg/dL (ref 1.5–2.5)

## 2016-07-24 NOTE — Assessment & Plan Note (Signed)
This is due to side effects of treatment He is not symptomatic We will observe only

## 2016-07-24 NOTE — Telephone Encounter (Signed)
Left message with note below 

## 2016-07-24 NOTE — Progress Notes (Signed)
Radiation Oncology         [phone] ________________________________  Name: Andres Smith MRN: [id number]  Date: 07/24/2016  DOB: 01/31/1940  Follow-Up Visit Note  CC: Janith Lima, MD  [name], MD  Diagnosis and Prior Radiotherapy:    ICD-10-CM   1. 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 He is accompanied by his wife today.     The patient saw Dr. Alvy Bimler today. She recommended he discuss his ongoing dysphagia with Dr. [name] at his next appointment for consideration of esophageal dilation if appropriate. For his acute hyperkalemia she recommended laxative therapy and possible Kayexalate therapy in the future. Of note his BUN today was 23 and his creatinine was 1. This shows a slight bump in his BUN from previous.  On review of systems, the patient reports soreness to the left side of his tongue. He is currently using a feeding tube to instill 4.5 cans of Osmolite daily. He estimates he drinks 20 oz of water orally each day. The patient has lost about 3 lbs since 07/09/16. The patient reports he was swallowing well immediately post radiation, however he is now unable to pass liquids down his throat due to pain and gagging. The patient reports he is no longer using Silvadene to the skin at his radiation site, and reports his skin has healed well. He denies constipation.  The patient is not using tobacco products.  His next ENT visit is tomorrow with Dr. [name] at [facility].   ALLERGIES:  has No Known Allergies.  Meds: Current Outpatient Prescriptions  Medication Sig Dispense Refill  . aspirin EC 81 MG tablet Take 81  mg by mouth daily.    Marland Kitchen atorvastatin (LIPITOR) 80 MG tablet Take 80 mg by mouth at bedtime.    . balsalazide (COLAZAL) 750 MG capsule Take 3 capsules (2,250 mg total) by mouth 3 (three) times daily. 270 capsule 3  . cyanocobalamin (,VITAMIN B-12,) 1000 MCG/ML injection INJECT 1ML ONCE MONTHLY. 3 mL 1  . magic mouthwash w/lidocaine SOLN Take 10 mLs by mouth 4 (four) times daily as needed for mouth pain. 240 mL 0  . metFORMIN (GLUCOPHAGE) 500 MG tablet Place 1 tablet (500 mg total) into feeding tube 2 (two) times daily with a meal. 60 tablet 0  . nitroGLYCERIN (NITROSTAT) 0.4 MG SL tablet Place 0.4 mg under the tongue every 5 (five) minutes as needed for chest pain.    . Nutritional Supplements (FEEDING SUPPLEMENT, OSMOLITE 1.5 CAL,) LIQD Give 1 and 1/2 cans Osmolite 1.5 via PEG 4 times daily with 60 mL free water before and after bolus feedings. In addition, drink or flush PEG with 240 mL free water TID. Send formula and supplies. 6 Bottle 0  . Sennosides (SENEXON PO) Place 1 packet into feeding tube daily as needed (constipation).    . isosorbide mononitrate (ISMO,MONOKET) 20 MG tablet Crush 2 tablets (40 mg total) by mouth 2 (two) times daily at 10 AM and 5 PM. (Patient not taking: Reported on 07/05/2016) 120 tablet 11   No current facility-administered medications for this encounter.      Physical Findings:  The patient is in no acute distress. Patient is alert and oriented. Wt Readings from Last 3 Encounters:  07/24/16 179 lb 12.8 oz (81.6 kg)  07/24/16 180 lb (81.6 kg)  07/11/16 183 lb 11.2 oz (83.3 kg)    height is 5\' 8"  (1.727 m) and weight is 179 lb 12.8 oz (81.6 kg). His temperature is 97.7 F (36.5 C). His blood pressure is 117/75 and his pulse is 70. His oxygen saturation is 100%.  General: Alert and oriented, in no acute distress. HEENT: Head is normocephalic. Eyes are red and teary - pt reports this comes and goes for years. He has resolving mucositis in his mouth. He had some  glue-like brown substance on the roof of his mouth which I scraped off today. Some erythema over the left lateral tongue. Skin: Skin in treatment fields shows satisfactory healing. Lymphatics: see Neck Exam Psychiatric: Judgment and insight are intact. Affect is appropriate.   Lab Findings: Lab Results  Component Value Date   WBC 4.5 07/24/2016   HGB 11.1 (L) 07/24/2016   HCT 33.4 (L) 07/24/2016   MCV 100.2 (H) 07/24/2016   PLT 99 (L) 07/24/2016   CMP     Component Value Date/Time   NA 141 07/24/2016 1347   K 5.3 (H) 07/24/2016 1347   CL 100 (L) 06/29/2016 0454   CO2 30 (H) 07/24/2016 1347   GLUCOSE 173 (H) 07/24/2016 1347   GLUCOSE 98 12/12/2005 0729   BUN 23.0 07/24/2016 1347   CREATININE 1.0 07/24/2016 1347   CALCIUM 9.5 07/24/2016 1347   PROT 6.6 07/24/2016 1347   ALBUMIN 3.5 07/24/2016 1347   AST 17 07/24/2016 1347   ALT 7 07/24/2016 1347   ALKPHOS 68 07/24/2016 1347   BILITOT 0.48 07/24/2016 1347   GFRNONAA >60 06/29/2016 0454   GFRAA >60 06/29/2016 0454    Lab Results  Component Value Date   TSH 0.781 06/06/2016    Radiographic Findings: Dg Chest 2 View  Result Date: 06/26/2016 CLINICAL DATA:  Patient with abnormal vital signs. Recent chemotherapy. EXAM: CHEST  2 VIEW COMPARISON:  Chest radiograph 05/22/2016. FINDINGS: Right anterior chest wall Port-A-Cath is present with tip projecting over the superior vena cava. Patient status post median sternotomy. Stable cardiac and mediastinal contours. Low lung volumes. No large area pulmonary consolidation. No pleural effusion or pneumothorax. Thoracic spine degenerative changes. IMPRESSION: Low lung volumes.  No acute cardiopulmonary process. Electronically Signed   By: Lovey Newcomer M.D.   On: 06/26/2016 18:27   Ct Head Wo Contrast  Result Date: 06/26/2016 CLINICAL DATA:  Foot drop and fever. EXAM: CT HEAD WITHOUT CONTRAST TECHNIQUE: Contiguous axial images were obtained from the base of the skull through the vertex  without intravenous contrast. COMPARISON:  None. FINDINGS: Brain: There is no evidence for acute hemorrhage, hydrocephalus, mass lesion, or abnormal extra-axial fluid collection. No definite CT evidence for acute infarction. Diffuse loss of parenchymal volume is consistent with atrophy. Patchy low attenuation in the deep hemispheric and periventricular white matter is nonspecific, but likely reflects chronic microvascular ischemic demyelination. 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 Vascular: No hyperdense vessel or unexpected calcification. Skull: No evidence for fracture. No worrisome lytic or sclerotic lesion. Sinuses/Orbits: The visualized paranasal sinuses and mastoid air cells are clear. Visualized portions of the globes and intraorbital fat are unremarkable. Other: None. IMPRESSION: 1. No acute intracranial abnormality 2. Atrophy with chronic small vessel white matter ischemic disease. 3. Lacunar infarct left cerebellar hemisphere. Electronically Signed   By: Randall Hiss  Tery Sanfilippo M.D.   On: 06/26/2016 20:42    Impression/Plan:    1) Head and Neck Cancer Status: Healing from RT.  2) Nutritional Status: The patient has lost 3 lbs recently - increase osmolite to 6 cans daily. Patient should instill at least 5 cups of water daily. PEG tube: in place; patient is taking most nutrition via PEG tube.  3) Risk Factors: The patient has been educated about risk factors including alcohol and tobacco abuse; they understand that avoidance of alcohol and tobacco is important to prevent recurrences as well as other cancers.  4) Swallowing: difficult - patient will continue to participate in swallowing therapy. I will ask Garald Balding to move up the patient's appointment due to his acute worsening symptoms.  5) Dental: Encouraged to continue regular followup with dentistry, and dental hygiene including fluoride rinses.   6) Thyroid function:  Lab Results  Component Value Date   TSH 0.781 06/06/2016    7)  Other: Patient may use a warm compress to his eyes to soothe redness and irritation. If he wishes, he may pursue follow up with an optometrist for further evaluation.  The patient may continue to gently scrape the secretion from the roof of his mouth as needed.  8) Follow-up with Dr. [name] tomorrow as scheduled. Follow up with me in 2.5 months, with CT scans neck/chest prior to follow up. The patient was encouraged to call with any issues or questions before then.  I spent 20 minutes face to face with the patient and more than 50% of that time was spent in counseling and/or coordination of care. _____________________________________   [name], MD  This document serves as a record of services personally performed by [name], MD. It was created on her behalf by Maryla Morrow, a trained medical scribe. The creation of this record is based on the scribe's personal observations and the provider's statements to them. This document has been checked and approved by the attending provider.

## 2016-07-24 NOTE — Telephone Encounter (Signed)
Scheduled appt per 7/24 los - Gave patient AVS and calender per los.  

## 2016-07-24 NOTE — Assessment & Plan Note (Signed)
He has worsening dysphagia He will continue close follow-up with speech and language therapist I am wondering whether he may have acquired fibrosis I recommend consideration for esophageal dilatation

## 2016-07-24 NOTE — Progress Notes (Signed)
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 Tumor measures 0.9 cm in greatest dimension. Tumor extends to the inked deep tissue edge. Depth of invasion: 4 mm (in this material) See note.  Note: an immunostains for P16 was performed at an outside hospital and provided for [place] to review; p16 immunostain is negative. Per outside report, In situ hybridization for high-risk HPV types showed no evidence of transcriptionally active high-risk HPV types.      03/21/2016 Initial Diagnosis    Salient findings:  -EAC's clear -Lesion within left buccal mucosa (pictured below) with about 60m depth. Width is about 2/3 of the left buccal mucosal surface area by palpation. Extends to the oral commissure.  -Tongue soft and mobile without mucosal lesions -Neck soft with no palpable neck masses -Right handed -Right hand is pink and warm with Allen's test negative (normal)  -Left forearm very sun damaged with multiple treated areas from precancerous or cancerous areas. Right forearm not as dramatic, presumably from sun exposure while driving      [id number]/1194Surgery    RESECTION OF ORAL BUCCAL MUCOSA SELECTIVE NECK DISSECTION FREE FLAP RADIAL FOREARM PR SPLIT GInkster<100 SQCM [15100] (SPLIT THICKNESS SKIN GRAFT LEG         04/13/2016 Pathology Results    A.LEFT ORAL COMMISSURE INFERIOR, EXCISION: No malignancy identified.  B.LEFT ORAL COMMISSURE SUPERIOR, EXCISION: No malignancy identified.  C.LEFT DEEP ORAL COMMISSURE, EXCISION: No malignancy identified.  D.RIGHT POSTERIOR BUCCAL INFERIOR, EXCISION: No malignancy identified.  E.RIGHT POSTERIOR BUCCAL SUPERIOR, EXCISION: No malignancy identified.  F.ORAL CAVITY, LEFT BUCCAL, EXCISION:  Invasive squamous cell carcinoma, moderately differentiated,  keratinizing. Tumor size:2.2 cm. Tumor depth of invasion:0.8 cm. Perineural invasion identified. Margins negative for malignancy. Pathologic stage:pT2 pN2a. See tumor protocol summary below.  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 Tumor deposit size:1.1 cm. Extranodal extension present. Benign salivary gland tissue.  SURGICAL PATHOLOGY CANCER CASE SUMMARY (AJCC 8TH EDITION) LIP AND ORAL CAVITY CAP Protocol posting date: June, 2017 Version: LipOralCavity 4.0.0.0  PROCEDURE: Wide resection of buccal mucosa TUMOR SITE:Oral:Buccal mucosa TUMOR LATERALITY: Left TUMOR FOCALITY: Unifocal TUMOR SIZE:Greatest dimension: 2.2 cm TUMOR DEPTH OF INVASION (DOI):8 mm HISTOLOGIC TYPE:Squamous cell carcinoma, conventional HISTOLOGIC GRADE:G2: Moderately differentiated SPECIMEN MARGINS:Uninvolved by invasive carcinoma  DISTANCE FROM CLOSEST MARGIN:1 mm  SPECIFY MARGIN(S):Deep LYMPHOVASCULAR INVASION:Present PERINEURAL INVASION: Present REGIONAL LYMPH NODES: NUMBER OF LYMPH NODES INVOLVED: 1 NUMBER OF LYMPH NODES EXAMINED: 4 LATERALITY OF LYMPH NODES INVOLVED: Ipsilateral SIZE OF LARGEST METASTATIC DEPOSIT:1.1 cm EXTRANODAL EXTENSION:Present PATHOLOGIC STAGE CLASSIFICATION (pTNM, AJCC 8th  Ed): pT2 pN2a pT2: umor >2 cm but <=4 cm and <=10 mm DOI pN2a:Metastasis in a single ipsilateral lymph node, 3 cm or  smaller in greatest dimension and ENE(+) BIOMARKERS:performed on prior outside biopsy ([facility] case [id number] outside case [id number] P16 (Immunohistochemistry):Negative HPV (high-risk types by in situ hybridization):Reported as negative      05/11/2016 Imaging    1.  4 mm left lower lobe pulmonary nodule. Attention on follow-up imaging recommended as metastatic disease not excluded. 2. 2.8 cm left thyroid nodule. Thyroid ultrasound recommended to further evaluate. 3. Bilateral renal cysts. 4. Coronary artery and thoracoabdominal aortic atherosclerosis. 5. Ankylosing spondylitis.        05/11/2016 Imaging    CT neck: Surgical clips in the region of the left buccal mucosa, presumably at the previous primary site. Mild scarring in that region measuring about 8 mm. Cannot assess for residual or recurrent disease in that location, but this may simply be scarring. Previous left submandibular resection and left neck node dissection. No abnormal nodes presently. Multinodular goiter. Ankylosing spondylitis.  C1-2 remains a mobile segment.      05/21/2016 Procedure    1. Successful placement of a right internal jugular approach power injectable Port-A-Cath. The Port a catheter is ready for immediate use. 2. Successful fluoroscopic insertion of a 20-French pull-through gastrostomy tube. The gastrostomy may be used immediately for medication administration and may be utilized in 24 hrs for the initiation of feeds.      05/23/2016 - 07/09/2016 Radiation Therapy    He received radiation treatment       05/25/2016 - 06/15/2016 Chemotherapy    He received weekly reduced dose cisplatin       06/21/2016 Adverse Reaction    Cycle 5 is delayed due to pancytopenia      06/26/2016 - 06/29/2016 Hospital Admission    He was admitted for management of neutropenic  fever       INTERVAL HISTORY: Please see below for problem oriented charting. He returns for further follow-up He complained of progressive dysphagia Denies recent choking His appetite is stable and have lost just a little bit of weight He alternate between constipation to diarrhea He has appointment pending to see his ENT doctor tomorrow and GI specialist at the end of the week He denies recent fever or chills He is no longer in pain and has not taken any pain medicine  REVIEW OF SYSTEMS:   Constitutional: Denies fevers, chills or abnormal weight loss Eyes: Denies blurriness of vision Ears, nose, mouth, throat, and face: Denies mucositis or sore throat Respiratory: Denies cough, dyspnea or wheezes Cardiovascular: Denies palpitation, chest discomfort or lower extremity swelling Gastrointestinal:  Denies nausea, heartburn or change in bowel habits Skin: Denies abnormal skin rashes Lymphatics: Denies new lymphadenopathy or easy bruising Neurological:Denies numbness, tingling or new weaknesses Behavioral/Psych: Mood is stable, no new changes  All other systems were reviewed with the patient and are negative.  I have reviewed the past medical history, past surgical history, social history and family history with the patient and they are unchanged from previous note.  ALLERGIES:  has No Known Allergies.  MEDICATIONS:  Current Outpatient Prescriptions  Medication Sig Dispense Refill  . aspirin EC 81 MG tablet Take 81 mg by mouth daily.    Marland Kitchen atorvastatin (LIPITOR) 80 MG tablet Take 80 mg by mouth at bedtime.    . balsalazide (COLAZAL) 750 MG capsule Take 3 capsules (2,250 mg total) by mouth 3 (three) times daily. 270 capsule 3  . cyanocobalamin (,VITAMIN B-12,) 1000 MCG/ML injection INJECT 1ML ONCE MONTHLY. 3 mL 1  . isosorbide mononitrate (ISMO,MONOKET) 20 MG tablet Crush 2 tablets (40 mg total) by mouth 2 (two) times daily at 10 AM and 5 PM. (Patient not taking: Reported on  07/05/2016) 120 tablet 11  . magic mouthwash w/lidocaine SOLN Take 10 mLs by mouth 4 (four) times daily  as needed for mouth pain. 240 mL 0  . metFORMIN (GLUCOPHAGE) 500 MG tablet Place 1 tablet (500 mg total) into feeding tube 2 (two) times daily with a meal. 60 tablet 0  . nitroGLYCERIN (NITROSTAT) 0.4 MG SL tablet Place 0.4 mg under the tongue every 5 (five) minutes as needed for chest pain.    . Nutritional Supplements (FEEDING SUPPLEMENT, OSMOLITE 1.5 CAL,) LIQD Give 1 and 1/2 cans Osmolite 1.5 via PEG 4 times daily with 60 mL free water before and after bolus feedings. In addition, drink or flush PEG with 240 mL free water TID. Send formula and supplies. 6 Bottle 0  . Sennosides (SENEXON PO) Place 1 packet into feeding tube daily as needed (constipation).    . silver sulfADIAZINE (SILVADENE) 1 % cream Apply 1 application topically daily. Apply after rad txs and prn, nothing before ra dtxs daily, must remove with gentle washing with warm water and soap before re-applying to neck area     No current facility-administered medications for this visit.     PHYSICAL EXAMINATION: ECOG PERFORMANCE STATUS: 1 - Symptomatic but completely ambulatory  Vitals:   07/24/16 1402  BP: (!) 126/54  Pulse: 61  Resp: 18  Temp: 98.1 F (36.7 C)   Filed Weights   07/24/16 1402  Weight: 180 lb (81.6 kg)    GENERAL:alert, no distress and comfortable SKIN: skin color, texture, turgor are normal, no rashes or significant lesions EYES: normal, Conjunctiva are pink and non-injected, sclera clear OROPHARYNX: Noted limited mouth opening due to prior surgery.  No evidence of thrush NECK: Noted mild lymphedema around his neck  LYMPH:  no palpable lymphadenopathy in the cervical, axillary or inguinal LUNGS: clear to auscultation and percussion with normal breathing effort HEART: regular rate & rhythm and no murmurs and no lower extremity edema ABDOMEN:abdomen soft, non-tender and normal bowel  sounds Musculoskeletal:no cyanosis of digits and no clubbing  NEURO: alert & oriented x 3 with fluent speech, no focal motor/sensory deficits  LABORATORY DATA:  I have reviewed the data as listed    Component Value Date/Time   NA 141 07/24/2016 1347   K 5.3 (H) 07/24/2016 1347   CL 100 (L) 06/29/2016 0454   CO2 30 (H) 07/24/2016 1347   GLUCOSE 173 (H) 07/24/2016 1347   GLUCOSE 98 12/12/2005 0729   BUN 23.0 07/24/2016 1347   CREATININE 1.0 07/24/2016 1347   CALCIUM 9.5 07/24/2016 1347   PROT 6.6 07/24/2016 1347   ALBUMIN 3.5 07/24/2016 1347   AST 17 07/24/2016 1347   ALT 7 07/24/2016 1347   ALKPHOS 68 07/24/2016 1347   BILITOT 0.48 07/24/2016 1347   GFRNONAA >60 06/29/2016 0454   GFRAA >60 06/29/2016 0454    No results found for: SPEP, UPEP  Lab Results  Component Value Date   WBC 4.5 07/24/2016   NEUTROABS 3.1 07/24/2016   HGB 11.1 (L) 07/24/2016   HCT 33.4 (L) 07/24/2016   MCV 100.2 (H) 07/24/2016   PLT 99 (L) 07/24/2016      Chemistry      Component Value Date/Time   NA 141 07/24/2016 1347   K 5.3 (H) 07/24/2016 1347   CL 100 (L) 06/29/2016 0454   CO2 30 (H) 07/24/2016 1347   BUN 23.0 07/24/2016 1347   CREATININE 1.0 07/24/2016 1347      Component Value Date/Time   CALCIUM 9.5 07/24/2016 1347   ALKPHOS 68 07/24/2016 1347   AST 17 07/24/2016 1347   ALT 7 07/24/2016 1347  BILITOT 0.48 07/24/2016 1347       RADIOGRAPHIC STUDIES: I have personally reviewed the radiological images as listed and agreed with the findings in the report. Dg Chest 2 View  Result Date: 06/26/2016 CLINICAL DATA:  Patient with abnormal vital signs. Recent chemotherapy. EXAM: CHEST  2 VIEW COMPARISON:  Chest radiograph 05/22/2016. FINDINGS: Right anterior chest wall Port-A-Cath is present with tip projecting over the superior vena cava. Patient status post median sternotomy. Stable cardiac and mediastinal contours. Low lung volumes. No large area pulmonary consolidation. No pleural  effusion or pneumothorax. Thoracic spine degenerative changes. IMPRESSION: Low lung volumes.  No acute cardiopulmonary process. Electronically Signed   By: Lovey Newcomer M.D.   On: 06/26/2016 18:27   Ct Head Wo Contrast  Result Date: 06/26/2016 CLINICAL DATA:  Foot drop and fever. EXAM: CT HEAD WITHOUT CONTRAST TECHNIQUE: Contiguous axial images were obtained from the base of the skull through the vertex without intravenous contrast. COMPARISON:  None. FINDINGS: Brain: There is no evidence for acute hemorrhage, hydrocephalus, mass lesion, or abnormal extra-axial fluid collection. No definite CT evidence for acute infarction. Diffuse loss of parenchymal volume is consistent with atrophy. Patchy low attenuation in the deep hemispheric and periventricular white matter is nonspecific, but likely reflects chronic microvascular ischemic demyelination. 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 Vascular: No hyperdense vessel or unexpected calcification. Skull: No evidence for fracture. No worrisome lytic or sclerotic lesion. Sinuses/Orbits: The visualized paranasal sinuses and mastoid air cells are clear. Visualized portions of the globes and intraorbital fat are unremarkable. Other: None. IMPRESSION: 1. No acute intracranial abnormality 2. Atrophy with chronic small vessel white matter ischemic disease. 3. Lacunar infarct left cerebellar hemisphere. Electronically Signed   By: Misty Stanley M.D.   On: 06/26/2016 20:42    ASSESSMENT & PLAN:  Buccal mucosa squamous cell carcinoma (HCC) He has completed all treatment and appears to be recovering well from side effects of treatment I will space out his appointment and will continue to provide supportive care Unfortunately, he is developing dysphagia I recommend he consult with his ENT surgeon tomorrow to consider esophageal dilatation   Dysphagia He has worsening dysphagia He will continue close follow-up with speech and language therapist I am wondering  whether he may have acquired fibrosis I recommend consideration for esophageal dilatation  Acute hyperkalemia He continues to have intermittent acute hyperkalemia I recommend laxative therapy If it does not improve, we might have to give him Kayexalate  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  All questions were answered. The patient knows to call the clinic with any problems, questions or concerns. No barriers to learning was detected. I spent 15 minutes counseling the patient face to face. The total time spent in the appointment was 20 minutes and more than 50% was on counseling and review of test results     [name], MD 07/24/2016 2:30 PM

## 2016-07-24 NOTE — Assessment & Plan Note (Signed)
He continues to have intermittent acute hyperkalemia I recommend laxative therapy If it does not improve, we might have to give him Kayexalate

## 2016-07-24 NOTE — Telephone Encounter (Signed)
-----   Message from [name], MD sent at 07/24/2016  2:27 PM EDT ----- Regarding: high potassium again pls call his wife, potassium is high again Recommend continue mild laxatives daily (aim for at least 3 BM) ----- Message ----- From: Interface, Lab In Three Zero One Sent: 07/24/2016   1:56 PM To: [name], MD

## 2016-07-24 NOTE — Assessment & Plan Note (Signed)
He has completed all treatment and appears to be recovering well from side effects of treatment I will space out his appointment and will continue to provide supportive care Unfortunately, he is developing dysphagia I recommend he consult with his ENT surgeon tomorrow to consider esophageal dilatation

## 2016-07-25 ENCOUNTER — Other Ambulatory Visit: Payer: Self-pay | Admitting: Radiation Oncology

## 2016-07-25 DIAGNOSIS — C060 Malignant neoplasm of cheek mucosa: Secondary | ICD-10-CM

## 2016-07-25 DIAGNOSIS — Z931 Gastrostomy status: Secondary | ICD-10-CM | POA: Diagnosis not present

## 2016-07-26 ENCOUNTER — Encounter: Payer: Self-pay | Admitting: Internal Medicine

## 2016-07-26 ENCOUNTER — Ambulatory Visit (INDEPENDENT_AMBULATORY_CARE_PROVIDER_SITE_OTHER): Payer: PPO | Admitting: Internal Medicine

## 2016-07-26 ENCOUNTER — Other Ambulatory Visit: Payer: Self-pay | Admitting: *Deleted

## 2016-07-26 VITALS — BP 118/64 | HR 68 | Ht 68.0 in | Wt 179.2 lb

## 2016-07-26 DIAGNOSIS — R1310 Dysphagia, unspecified: Secondary | ICD-10-CM | POA: Diagnosis not present

## 2016-07-26 DIAGNOSIS — K5190 Ulcerative colitis, unspecified, without complications: Secondary | ICD-10-CM

## 2016-07-26 DIAGNOSIS — C7989 Secondary malignant neoplasm of other specified sites: Secondary | ICD-10-CM | POA: Diagnosis not present

## 2016-07-26 NOTE — Patient Outreach (Signed)
[place] [facility]) Care Management  07/26/2016  RICHMOND COLDREN Oct 04, 1940 [id number]   HTA-High Risk Screen  Initial outreach attempt unsuccessful however RN able to leave a HIPAA approved voice message requesting a call back. Will continue outreach attempts for a health screening.  Raina Mina, RN Care Management Coordinator [place] Office [phone]

## 2016-07-26 NOTE — Progress Notes (Signed)
Subjective:    Patient ID: Andres Smith, male    DOB: 1940/12/16, 76 y.o.   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 He is here today with his wife. He was last seen in the office on 10/03/2015.  Previously his UC had been managed with 6-MP and Lialda. In the past when 6-MP was discontinued by Dr. [name] he had a flare of his colitis and the medication was restarted. His last surveillance colonoscopy was on 02/04/2015 which showed normal colon without evidence of active colitis. Random 4 quadrant surveillance biopsies negative for dysplasia.  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 Tumor was present in lymph nodes. He has been undergoing chemotherapy with cisplatin and radiation therapy. He completed radiation therapy approximately 2 weeks ago. He had neutropenic fever and was admitted to the hospital in late June 2018. He required 2 units of packed red cells during this admission and was on antibiotics for 2 weeks. He also has had intermittent issues with hyperkalemia and has been treated several times with Kayexalate.  He had a G-tube placed in May 2018. He has had issues with swallowing and thus his Lialda was changed to balsalazide. He's been using balsalazide 2.25 g 3 times a day. He has been off of his 6-MP for about a month. His wife states they stopped this medication on advice from Dr. [name], who is one of their close friends and also his previous gastroenterologist.   He reports that he was having issues with diarrhea but this has improved dramatically. He is still having loose  stools about 4 times per day. He attributes this to his tube feeds. He is eating solely by tube feeds recently. He's had odynophagia for the past 6 or 7 days worse than previously. This is caused him at least in the past 5-6 days to eat or drink nothing by mouth.  He followed up with his ENT physician at [facility] Dr. Owens Shark yesterday. He and his wife report that Dr. 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  He denies abdominal pain. Denies blood in his stool. On previous flares he developed lower abdominal crampy pain and blood in his stool, none of which are occurring now. He does report borborygmi with his tube feeds.  Review of Systems  as per history of present illness, otherwise negative   Current Medications, Allergies, Past Medical History, Past Surgical History, Family History and Social History were reviewed in [employer] record.     Objective:   Physical Exam BP 118/64   Pulse 68   Ht 5\' 8"  (1.727 m)   Wt 179 lb 4 oz (81.3 kg)   BMI 27.25 kg/m  Constitutional: Well-developed No distressAnd comfortable appearing. HEENT: Limited jaw mobility but there is evidence of mucosal thickening and erythema in the left tongue and on the roof of his mouth, no obvious thrush is seen. Conjunctivae are normal.  No scleral icterus. Neck: Thickening of the skin particularly in the left neck, nontender Cardiovascular: Normal rate, regular rhythm and intact distal pulses. No M/R/G Pulmonary/chest: Effort  normal and breath sounds normal. No wheezing, rales or rhonchi. Abdominal: Soft, nontender, nondistended. Bowel sounds active throughout.  Extremities: no clubbing, cyanosis, or edema Neurological: Alert and oriented to person place and time. Skin: Skin is warm and dry. Psychiatric: Normal mood and affect. Behavior is normal.  CT ABDOMEN AND  PELVIS WITH CONTRAST   TECHNIQUE: Multidetector CT imaging of the abdomen and pelvis was performed using the standard protocol following bolus administration of intravenous contrast.   CONTRAST:  144mL ISOVUE-300 IOPAMIDOL (ISOVUE-300) INJECTION 61%   COMPARISON:  CT of the abdomen and pelvis performed 05/11/2016   FINDINGS: Lower chest: Diffuse coronary artery calcifications are seen. The patient is status post median sternotomy. Bibasilar atelectasis or scarring is noted.   Hepatobiliary: The liver is unremarkable in appearance. A stone is noted dependently within the gallbladder. The gallbladder is otherwise unremarkable. The common bile duct remains normal in caliber.   Pancreas: The pancreas is within normal limits.   Spleen: The spleen is unremarkable in appearance.   Adrenals/Urinary Tract: The adrenal glands are unremarkable in appearance.   Bilateral renal cysts are noted. Nonspecific perinephric stranding is noted bilaterally. There is no evidence of hydronephrosis. No renal or ureteral stones are identified.   Stomach/Bowel: The patient's G-tube is noted at the body of the stomach. A small amount of free air is seen tracking within the abdomen, reflecting recent surgery.   The small bowel is within normal limits. The appendix is not visualized; there is no evidence for appendicitis.   There is wall thickening along the distal sigmoid colon and rectum, raising question for mild proctitis. The colon is otherwise unremarkable in appearance.   Vascular/Lymphatic: Scattered calcification is seen along the abdominal aorta and its branches. The abdominal aorta is otherwise grossly unremarkable. The inferior vena cava is grossly unremarkable. No retroperitoneal lymphadenopathy is seen. No pelvic sidewall lymphadenopathy is identified.   Reproductive: The bladder is mildly distended. Mild bladder wall thickening could reflect cystitis. The prostate is  enlarged, measuring 5.6 cm in transverse dimension, with mild heterogeneity.   Other: A small left inguinal hernia is noted, containing only fat.   Musculoskeletal: No acute osseous abnormalities are identified. Anterior bridging osteophytes are noted along the lower thoracic and upper lumbar spine. Facet disease is noted along the lumbar spine. The visualized musculature is unremarkable in appearance.   IMPRESSION: 1. Wall thickening along the distal sigmoid colon rectum raises question for mild proctitis. 2. Mild bladder wall thickening could reflect cystitis. 3. Postoperative free air within the abdomen, given recent surgery. G-tube noted ending at the body of the stomach. 4. Diffuse coronary artery calcifications seen. 5. Cholelithiasis.  Gallbladder otherwise unremarkable. 6. Bilateral renal cysts noted. 7. Scattered aortic atherosclerosis. 8. Enlarged prostate, with mild heterogeneity. Would correlate with PSA. 9. Small left inguinal hernia, containing only fat. 10. Anterior bridging osteophytes along the lower thoracic and upper lumbar spine.     Electronically Signed   By: Garald Balding M.D.   On: 05/21/2016 19:21   CBC    Component Value Date/Time   WBC 4.5 07/24/2016 1347   WBC 4.1 06/29/2016 0454   RBC 3.34 (L) 07/24/2016 1347   RBC 2.81 (L) 06/29/2016 0454   HGB 11.1 (L) 07/24/2016 1347   HCT 33.4 (L) 07/24/2016 1347   PLT 99 (L) 07/24/2016 1347   MCV 100.2 (H) 07/24/2016 1347   MCH 33.3 07/24/2016 1347   MCH 33.5 06/29/2016 0454   MCHC 33.2 07/24/2016 1347   MCHC 35.1  06/29/2016 0454   RDW 16.6 (H) 07/24/2016 1347   LYMPHSABS 0.6 (L) 07/24/2016 1347   MONOABS 0.6 07/24/2016 1347   EOSABS 0.1 07/24/2016 1347   BASOSABS 0.0 07/24/2016 1347   CMP     Component Value Date/Time   NA 141 07/24/2016 1347   K 5.3 (H) 07/24/2016 1347   CL 100 (L) 06/29/2016 0454   CO2 30 (H) 07/24/2016 1347   GLUCOSE 173 (H) 07/24/2016 1347   GLUCOSE 98 12/12/2005 0729    BUN 23.0 07/24/2016 1347   CREATININE 1.0 07/24/2016 1347   CALCIUM 9.5 07/24/2016 1347   PROT 6.6 07/24/2016 1347   ALBUMIN 3.5 07/24/2016 1347   AST 17 07/24/2016 1347   ALT 7 07/24/2016 1347   ALKPHOS 68 07/24/2016 1347   BILITOT 0.48 07/24/2016 1347   GFRNONAA >60 06/29/2016 0454   GFRAA >60 06/29/2016 0454       Assessment & Plan:  76 year old male with a history of long-standing pan-ulcerative colitis, ankylosing spondylitis, CAD status post CABG, hypertension, hyperlipidemia, diabetes and unfortunately recent diagnosis of 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  1. Pan ulcerative colitis -- long-standing history of ulcerative colitis though in clinical and histologic remission last year at the time of colonoscopy. We have changed his mesalamine to balsalazide so that it can be given via tube. I recommended that he continue balsalazide 2.25 g 3 times a day. His loose stools at this point are felt secondary to tube feeds and not consistent with flare of ulcerative colitis. He has an off of 6-MP and so I am concerned about the possibility of flare going forward. Several years ago when this medication was stopped he did experience a flare of his colitis. 6-MP certainly can be associated with leukopenia which he does not need given his recent issues with pancytopenia related to chemotherapy. I'm going to reach out to Dr. Alvy Bimler for her opinion regarding resuming 6-MP at previous dose. I will defer this decision to her but if she is okay we will resume 6-MP at 50 mg daily. I asked that he notify me should he develop abdominal pain, worsening loose stools and or blood in stool.  2. 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 Radiation has very recently completed. Patient reported that Dr. Owens Shark recommended against endoscopy this early into recovery. I  agree. We'll hold off on endoscopy for now. Hopefully symptoms will slowly improve after radiation. If after adequate time for healing he continues to have odynophagia or dysphagia I would begin with a barium esophagram followed by endoscopy as appropriate. He has both oncology and ENT follow-up in place.   3. B12 def -- continue monthly IM B12  40 minutes spent with the patient today. Greater than 50% was spent in counseling and coordination of care with the patient

## 2016-07-26 NOTE — Patient Instructions (Addendum)
Please continue on your Colazal,3 capsules,  three times a day via feeding tube.   Normal BMI (Body Mass Index- based on height and weight) is between 19 and 25. Your BMI today is Body mass index is 27.25 kg/m. Marland Kitchen Please consider follow up  regarding your BMI with your Primary Care Provider.    Follow up with Dr. Hilarie Fredrickson in 4 months.

## 2016-07-27 ENCOUNTER — Telehealth: Payer: Self-pay | Admitting: *Deleted

## 2016-07-27 ENCOUNTER — Encounter: Payer: Self-pay | Admitting: *Deleted

## 2016-07-27 ENCOUNTER — Other Ambulatory Visit: Payer: Self-pay | Admitting: *Deleted

## 2016-07-27 ENCOUNTER — Other Ambulatory Visit: Payer: Self-pay | Admitting: Radiation Oncology

## 2016-07-27 DIAGNOSIS — C069 Malignant neoplasm of mouth, unspecified: Secondary | ICD-10-CM | POA: Diagnosis not present

## 2016-07-27 DIAGNOSIS — I69991 Dysphagia following unspecified cerebrovascular disease: Secondary | ICD-10-CM | POA: Diagnosis not present

## 2016-07-27 DIAGNOSIS — K5190 Ulcerative colitis, unspecified, without complications: Secondary | ICD-10-CM

## 2016-07-27 MED ORDER — MERCAPTOPURINE 50 MG PO TABS: 50.0000 mg | ORAL_TABLET | Freq: Every day | ORAL | 1 refills | Status: DC

## 2016-07-27 NOTE — Telephone Encounter (Signed)
-----   Message from Jerene Bears, MD sent at 07/27/2016  9:00 AM EDT ----- Regarding: med Carla Drape, Please see below from Dr. Elson Areas She feels it is appropriate and safe for [name] to resume his 31mp at previous dose.   Please copy conversation to chart and let patient know. This can be given VT Would recommend CBC and CMP 2 weeks after restarting this med which he has tolerated very well in the past. Thanks JMP  ----- Message ----- From: [name], MD Sent: 07/27/2016   6:58 AM To: Jerene Bears, MD  Hi Ulice Dash,  He is more than 6 weeks away from chemo now and has been off radiation for over 1 month His white count has been normal. I think he is ready to resume 6MP.  ----- Message ----- From: Jerene Bears, MD Sent: 07/26/2016   5:12 PM To: [name], MD  Ni,  I saw Andres Smith and his wife today in clinic. I think from UC standpoint he is doing okay.  Loose stools are better and likely tube feed related at present. In the past when 35mp was stopped he had a flare of his colitis.  He has been off of 61mp.  That said, I am hesitant to restart it without your permission given the issues with leukopenia that can result (though this was not an issue for him before). In the interim he will continue balsalazide via tube. Dr. Owens Shark at [facility] recommended more time for healing before any consideration of EGD.  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 I appreciate your thoughts re: 91mp  Thanks again [place]

## 2016-07-27 NOTE — Telephone Encounter (Signed)
I have spoken to patient and his wife to advise that per Dr Alvy Bimler and Dr Hilarie Fredrickson, he may restart mercaptopurine 50 mg (previous dose) by VT and have CBC and CMP in 2 weeks. Patient and wife verbalize understanding.

## 2016-07-27 NOTE — Patient Outreach (Signed)
[facility]) Care Management  07/27/2016  Andres Smith 15-Mar-1940 [id number]   HTA -High Risk Screen  RN received a call back from member. RN introduced agency and the purpose for today's call. Screening completed as pt reports a new diagnosis of cancer (mouth) and discussed his ongoing treatment with chemotherapy and radiation. States he "feels good" and now able to talk with no discomfort. Pt reports his diabetes is under control and he is currently receiving his feeds through a feeding tube. Pt has adjusted and reports he is doing "much better now".  Pt states no needs at this time but may need in the future. RN offered the concierge contact number if needed in the future. Pt receptive with no additional needs.   Raina Mina, RN Care Management Coordinator [place] Office [phone]

## 2016-07-30 ENCOUNTER — Telehealth: Payer: Self-pay | Admitting: *Deleted

## 2016-07-30 ENCOUNTER — Telehealth: Payer: Self-pay | Admitting: Nutrition

## 2016-07-30 NOTE — Telephone Encounter (Signed)
Contacted patient about TF supplies. Patient has contacted home care agency and TF has been delivered.

## 2016-07-31 ENCOUNTER — Other Ambulatory Visit: Payer: Self-pay | Admitting: Radiation Oncology

## 2016-07-31 ENCOUNTER — Other Ambulatory Visit (HOSPITAL_COMMUNITY): Payer: Self-pay | Admitting: Radiation Oncology

## 2016-07-31 DIAGNOSIS — C060 Malignant neoplasm of cheek mucosa: Secondary | ICD-10-CM

## 2016-07-31 DIAGNOSIS — R1319 Other dysphagia: Secondary | ICD-10-CM

## 2016-07-31 NOTE — Telephone Encounter (Signed)
Oncology Nurse Navigator Documentation  Mr. Geralds returned my 1100 call from this morning, indicated he did not have MBSS las week at [facility]. He noted he has not been able to swallow liquids/solids for about a week.  I suggested he try turing his head to R or L as able when swallowing per my patient observation with SLP Garald Balding.  He stated he has limited neck ROM d/t hx of cervical vertebrae fusion but will try.  Dr. Isidore Moos informed.  Gayleen Orem, RN, BSN, [place] Oncology Nurse [place] at [place] [phone]

## 2016-08-02 ENCOUNTER — Telehealth: Payer: Self-pay | Admitting: *Deleted

## 2016-08-02 NOTE — Telephone Encounter (Signed)
Called patient to inform of Modified Barium Swallow for 08-06-16 @ [facility], spoke with patient and he is aware of this appt.

## 2016-08-06 ENCOUNTER — Ambulatory Visit (HOSPITAL_COMMUNITY): Payer: PPO

## 2016-08-06 ENCOUNTER — Ambulatory Visit (HOSPITAL_COMMUNITY): Admission: RE | Admit: 2016-08-06 | Payer: PPO | Source: Ambulatory Visit

## 2016-08-06 ENCOUNTER — Ambulatory Visit (HOSPITAL_COMMUNITY)
Admission: RE | Admit: 2016-08-06 | Discharge: 2016-08-06 | Disposition: A | Discharge status: Home / Self Care | Payer: PPO | Source: Ambulatory Visit | Attending: Radiation Oncology | Admitting: Radiation Oncology

## 2016-08-06 ENCOUNTER — Other Ambulatory Visit (HOSPITAL_COMMUNITY): Payer: PPO

## 2016-08-06 ENCOUNTER — Telehealth: Payer: Self-pay | Admitting: *Deleted

## 2016-08-06 DIAGNOSIS — C760 Malignant neoplasm of head, face and neck: Secondary | ICD-10-CM | POA: Diagnosis not present

## 2016-08-06 DIAGNOSIS — K5010 Crohn's disease of large intestine without complications: Secondary | ICD-10-CM | POA: Insufficient documentation

## 2016-08-06 DIAGNOSIS — I2510 Atherosclerotic heart disease of native coronary artery without angina pectoris: Secondary | ICD-10-CM | POA: Insufficient documentation

## 2016-08-06 DIAGNOSIS — Z923 Personal history of irradiation: Secondary | ICD-10-CM | POA: Insufficient documentation

## 2016-08-06 DIAGNOSIS — I10 Essential (primary) hypertension: Secondary | ICD-10-CM | POA: Insufficient documentation

## 2016-08-06 DIAGNOSIS — Z85828 Personal history of other malignant neoplasm of skin: Secondary | ICD-10-CM | POA: Insufficient documentation

## 2016-08-06 DIAGNOSIS — Z951 Presence of aortocoronary bypass graft: Secondary | ICD-10-CM | POA: Insufficient documentation

## 2016-08-06 DIAGNOSIS — E119 Type 2 diabetes mellitus without complications: Secondary | ICD-10-CM | POA: Insufficient documentation

## 2016-08-06 DIAGNOSIS — Z931 Gastrostomy status: Secondary | ICD-10-CM | POA: Insufficient documentation

## 2016-08-06 DIAGNOSIS — Z87442 Personal history of urinary calculi: Secondary | ICD-10-CM | POA: Diagnosis not present

## 2016-08-06 DIAGNOSIS — E785 Hyperlipidemia, unspecified: Secondary | ICD-10-CM | POA: Insufficient documentation

## 2016-08-06 DIAGNOSIS — K760 Fatty (change of) liver, not elsewhere classified: Secondary | ICD-10-CM | POA: Diagnosis not present

## 2016-08-06 DIAGNOSIS — K219 Gastro-esophageal reflux disease without esophagitis: Secondary | ICD-10-CM | POA: Diagnosis not present

## 2016-08-06 DIAGNOSIS — C060 Malignant neoplasm of cheek mucosa: Secondary | ICD-10-CM | POA: Insufficient documentation

## 2016-08-06 DIAGNOSIS — E042 Nontoxic multinodular goiter: Secondary | ICD-10-CM | POA: Diagnosis not present

## 2016-08-06 DIAGNOSIS — R1312 Dysphagia, oropharyngeal phase: Secondary | ICD-10-CM | POA: Diagnosis not present

## 2016-08-06 DIAGNOSIS — R1319 Other dysphagia: Secondary | ICD-10-CM

## 2016-08-06 DIAGNOSIS — M453 Ankylosing spondylitis of cervicothoracic region: Secondary | ICD-10-CM | POA: Diagnosis not present

## 2016-08-06 DIAGNOSIS — R1310 Dysphagia, unspecified: Secondary | ICD-10-CM | POA: Diagnosis not present

## 2016-08-06 NOTE — Telephone Encounter (Signed)
Oncology Nurse Navigator Documentation  Received call from patient's wife stating she cancelled his 2:00 MBSS today "because I'm afraid he will aspirate, he can only drink liquids".  I emphasized the importance of study to clarify his swallowing function so he can be provided dietary guidance, in turn SLP Garald Balding can provide further support with HEP when he sees him tomorrow.  She voiced understanding, indicated she would call to reinstate appt.  SLP Garald Balding updated.  Gayleen Orem, RN, BSN, [place] Oncology Nurse [place] at [place] [phone]

## 2016-08-07 ENCOUNTER — Ambulatory Visit: Payer: PPO | Attending: Radiation Oncology

## 2016-08-07 DIAGNOSIS — R1310 Dysphagia, unspecified: Secondary | ICD-10-CM | POA: Insufficient documentation

## 2016-08-07 NOTE — Patient Instructions (Signed)
Follow the recommendations on the sheet Tammy provided to you yesterday.

## 2016-08-07 NOTE — Therapy (Signed)
[facility] [address] [place], [place], [address] Phone: [phone]   Fax:  [phone]  Speech Language Pathology Treatment  Patient Details  Name: Andres Smith MRN: [id number] Date of Birth: 1940/10/10 Referring Provider: [name], MD  Encounter Date: 08/07/2016      End of Session - 08/07/16 2338    Visit Number 3   Number of Visits 5   Date for SLP Re-Evaluation 10/24/16   SLP Start Time 1449   SLP Stop Time  1529   SLP Time Calculation (min) 40 min   Activity Tolerance Patient tolerated treatment well      Past Medical History:  Diagnosis Date  . Ankylosing spondylitis ([place])   . Blood transfusion    hx of transfusion without reaction  . Bradycardia   . Cancer ([place]) 03/2016   oral  . Cholelithiasis   . Coronary artery disease    a. s/p CABG x 6 in 1997 (LIMA->LAD, VG->RI ->OM1->OM2, VG->PDA->PLV // b. 05/2011 Cath:  patent grafs, native prox rca and d2 dzs  ->med rx. // c. Myoview 1/18: not gated, large inferolateral scar, no ischemia; Intermediate Risk (IL scar old - on prior studies >> med rx)  . Crohn's colitis ([place]) 12/01/1997  . Diabetes mellitus   . Dysphagia   . Fatty liver   . GERD (gastroesophageal reflux disease)   . Heart block   . Hiatal hernia 12/03/2008  . History of radiation therapy 05/23/16- 07/09/16   Left Cheek/ 60 Gy in 30 fractions to gross disease, 63 Gy in 35 fractions to high risk nodal echelons, and 56 Gy in 35 fractions to intermedicate risk nodal echelons.   . History of skin cancer   . Hx of echocardiogram    Echo 5/14:  Mild LVH, EF 55-60%, NL diast fxn, mild LAE   . Hyperlipidemia   . Hypertension   . Kidney stones   . Neck pain   . Pneumonia    hx of PNA  . Squamous cell cancer of buccal mucosa ([place])   . 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 cancer removal  2014   removed from left  outter leg  . COLONOSCOPY  2012  . CORONARY ARTERY BYPASS GRAFT  04/19/1995   x7  . EXCISION ORAL TUMOR Left    left jaw and lymph node  . HERNIA REPAIR  1989  . IR FLUORO GUIDE PORT INSERTION RIGHT  05/21/2016  . IR GASTROSTOMY TUBE MOD SED  05/21/2016  . IR US GUIDE VASC ACCESS RIGHT  05/21/2016  . LEFT HEART CATHETERIZATION WITH CORONARY/GRAFT ANGIOGRAM N/A 05/24/2011   Procedure: LEFT HEART CATHETERIZATION WITH Beatrix Fetters;  Surgeon: Larey Dresser, MD;  Location: [facility] CATH LAB;  Service: Cardiovascular;  Laterality: N/A;  . MOHS SURGERY     X 2 off chin and nose  . TONSILLECTOMY AND ADENOIDECTOMY  1944  . TRIGGER FINGER RELEASE    . UMBILICAL HERNIA REPAIR      There were no vitals filed for this visit.             ADULT SLP TREATMENT - 08/07/16 2332      General Information   Behavior/Cognition Alert;Cooperative;Pleasant mood     Treatment Provided   Treatment provided Dysphagia     Dysphagia Treatment   Temperature Spikes Noted No   Respiratory Status Room air   Treatment Methods Skilled observation;Therapeutic exercise;Patient/caregiver education;Compensation  strategy training   Patient observed directly with PO's Yes   Type of PO's observed Dysphagia 1 (puree);Thin liquids   Liquids provided via Cup   Oral Phase Signs & Symptoms Prolonged bolus formation  (believed intentional)   Pharyngeal Phase Signs & Symptoms --  none, when precautions from 08-06-16 MBSS used   Other treatment/comments Pt with POs as above. HEP completed today with rare min A. Pt was modified independent with swallow/aspiration precautions.      Assessment / Recommendations / Plan   Plan Continue with current plan of care     Dysphagia Recommendations   Diet recommendations Dysphagia 3 (mechanical soft);Dysphagia 2 (fine chop);Dysphagia 1 (puree);Thin liquid  (diet as tolerated)   Liquids provided via Cup   Medication Administration Via alternative means   Supervision  Patient able to self feed   Compensations Slow rate;Small sips/bites;Follow solids with liquid;Effortful swallow     Progression Toward Goals   Progression toward goals Progressing toward goals          SLP Education - 08/07/16 2337    Education provided Yes   Education Details precautions from yesterday's modified (MBSS)   Person(s) Educated Patient;Spouse   Methods Explanation;Demonstration   Comprehension Verbalized understanding;Returned demonstration          SLP Short Term Goals - 07/19/16 1627      SLP SHORT TERM GOAL #1   Title pt will complete HEP with min A    Time --   Period --   Status Achieved  occasionally     SLP SHORT TERM GOAL #2   Title pt will tell SLP why he is completing HEP   Time --   Period --   Status Not Met          SLP Long Term Goals - 08/07/16 2340      SLP LONG TERM GOAL #1   Title pt will complete HEP with modified independence over two sessions   Time 2   Period --  visits   Status Partially Met  revised, and ongoing     SLP LONG TERM GOAL #2   Title pt will tell SLP 3 overt s/s aspiration PNA with modified independence   Time 2   Period --  visits   Status Deferred     SLP LONG TERM GOAL #3   Title pt will tell SLP why a food journal is helpful in returning to most liberal diet   Status Achieved     SLP LONG TERM GOAL #4   Title pt will tell SLP when he can reduce HEP frequency to x2/week   Time 2   Period --  visits   Status New          Plan - 08/07/16 2339    Clinical Impression Statement Pt with oropharyngeal swallowing WFL for dys I items and water. Pt with modified (MBSS) yesterday - see results in "imaging". The probability of swallowing difficulty remains high after completion of chemo and radiation therapy. Pt will need to cont to be followed by SLP for regular assessment of accurate HEP completion as well as for safety with POs both during and following treatment/s.   Speech Therapy Frequency --   approx once every 4 weeks   Duration --  5 total visits   Treatment/Interventions Aspiration precaution training;Pharyngeal strengthening exercises;Diet toleration management by SLP;Compensatory techniques;Functional tasks;Patient/family education;SLP instruction and feedback   Potential to [place] provided today  Consulted and Agree with Plan of Care Patient      Patient will benefit from skilled therapeutic intervention in order to improve the following deficits and impairments:   Dysphagia, unspecified type    Problem List Patient Active Problem List   Diagnosis Date Noted  . Diarrhea 07/13/2016  . Anemia, chronic disease 07/13/2016  . Protein-calorie malnutrition, severe 06/27/2016  . Neutropenic fever ([place]) 06/26/2016  . Fever 06/26/2016  . Acquired pancytopenia ([place]) 06/14/2016  . Pressure injury of skin, stage 1 06/07/2016  . Other constipation 06/07/2016  . Acute hyperkalemia 05/31/2016  . Weight loss 05/31/2016  . Buccal mucosa squamous cell carcinoma ([place]) 05/10/2016  . B12 deficiency anemia 08/03/2013  . Ulcerative colitis ([place]) 01/17/2010  . Occlusion and stenosis of carotid artery without mention of cerebral infarction 09/20/2009  . GERD 12/02/2008  . Type II diabetes mellitus with manifestations ([place]) 04/27/2008  . Hyperlipidemia with target LDL less than 70 12/04/2007  . Essential hypertension, benign 12/04/2007  . Ankylosing spondylitis ([place]) 12/04/2007  . Coronary atherosclerosis of native coronary artery 12/04/2007  . 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 Dysphagia 05/06/2007    [facility] 08/07/2016, 11:42 PM  [facility] [address] [place], [place], [address] Phone: [phone]   Fax:  [phone]   Name: Andres Smith MRN: [id number] Date of Birth: 1940-03-15

## 2016-08-13 ENCOUNTER — Other Ambulatory Visit: Payer: Self-pay

## 2016-08-13 ENCOUNTER — Ambulatory Visit: Payer: PPO

## 2016-08-13 ENCOUNTER — Telehealth: Payer: Self-pay | Admitting: Hematology and Oncology

## 2016-08-13 ENCOUNTER — Other Ambulatory Visit (INDEPENDENT_AMBULATORY_CARE_PROVIDER_SITE_OTHER): Payer: PPO

## 2016-08-13 ENCOUNTER — Ambulatory Visit (HOSPITAL_BASED_OUTPATIENT_CLINIC_OR_DEPARTMENT_OTHER): Payer: PPO | Admitting: Hematology and Oncology

## 2016-08-13 ENCOUNTER — Other Ambulatory Visit: Payer: PPO

## 2016-08-13 DIAGNOSIS — D61818 Other pancytopenia: Secondary | ICD-10-CM | POA: Diagnosis not present

## 2016-08-13 DIAGNOSIS — C060 Malignant neoplasm of cheek mucosa: Secondary | ICD-10-CM | POA: Diagnosis not present

## 2016-08-13 DIAGNOSIS — R634 Abnormal weight loss: Secondary | ICD-10-CM | POA: Diagnosis not present

## 2016-08-13 DIAGNOSIS — K51919 Ulcerative colitis, unspecified with unspecified complications: Secondary | ICD-10-CM

## 2016-08-13 DIAGNOSIS — R1310 Dysphagia, unspecified: Secondary | ICD-10-CM

## 2016-08-13 DIAGNOSIS — Z95828 Presence of other vascular implants and grafts: Secondary | ICD-10-CM

## 2016-08-13 DIAGNOSIS — K5190 Ulcerative colitis, unspecified, without complications: Secondary | ICD-10-CM | POA: Diagnosis not present

## 2016-08-13 LAB — COMPREHENSIVE METABOLIC PANEL
ALK PHOS: 66 U/L (ref 39–117)
ALT: 8 U/L (ref 0–53)
AST: 16 U/L (ref 0–37)
Albumin: 3.9 g/dL (ref 3.5–5.2)
BILIRUBIN TOTAL: 0.7 mg/dL (ref 0.2–1.2)
BUN: 19 mg/dL (ref 6–23)
CO2: 32 mEq/L (ref 19–32)
Calcium: 9.5 mg/dL (ref 8.4–10.5)
Chloride: 101 mEq/L (ref 96–112)
Creatinine, Ser: 0.82 mg/dL (ref 0.40–1.50)
GFR: 96.85 mL/min (ref >60.00)
GLUCOSE: 209 mg/dL — AB (ref 70–99)
Potassium: 4.4 mEq/L (ref 3.5–5.1)
Sodium: 137 mEq/L (ref 135–145)
TOTAL PROTEIN: 6.3 g/dL (ref 6.0–8.3)

## 2016-08-13 LAB — CBC WITH DIFFERENTIAL/PLATELET
BASOS ABS: 0 10*3/uL (ref 0.0–0.1)
Basophils Relative: 0.3 % (ref 0.0–3.0)
EOS ABS: 0.1 10*3/uL (ref 0.0–0.7)
Eosinophils Relative: 3.3 % (ref 0.0–5.0)
HEMATOCRIT: 33.1 % — AB (ref 39.0–52.0)
Hemoglobin: 11 g/dL — ABNORMAL LOW (ref 13.0–17.0)
LYMPHS PCT: 17.7 % (ref 12.0–46.0)
Lymphs Abs: 0.5 10*3/uL — ABNORMAL LOW (ref 0.7–4.0)
MCHC: 33.3 g/dL (ref 30.0–36.0)
MCV: 101.7 fl — AB (ref 78.0–100.0)
MONOS PCT: 10.2 % (ref 3.0–12.0)
Monocytes Absolute: 0.3 10*3/uL (ref 0.1–1.0)
NEUTROS PCT: 68.5 % (ref 43.0–77.0)
Neutro Abs: 2 10*3/uL (ref 1.4–7.7)
Platelets: 131 10*3/uL — ABNORMAL LOW (ref 150.0–400.0)
RBC: 3.25 Mil/uL — AB (ref 4.22–5.81)
RDW: 16 % — ABNORMAL HIGH (ref 11.5–15.5)
WBC: 3 10*3/uL — ABNORMAL LOW (ref 4.0–10.5)

## 2016-08-13 MED ORDER — SODIUM CHLORIDE 0.9% FLUSH
10.0000 mL | INTRAVENOUS | Status: DC | PRN
  Administered 2016-08-13: 10 mL via INTRAVENOUS
  Filled 2016-08-13: qty 10

## 2016-08-13 NOTE — Telephone Encounter (Signed)
Gave patient avs and calendar for upcoming appt.

## 2016-08-14 ENCOUNTER — Encounter: Payer: Self-pay | Admitting: Hematology and Oncology

## 2016-08-14 ENCOUNTER — Telehealth: Payer: Self-pay | Admitting: Internal Medicine

## 2016-08-14 NOTE — Assessment & Plan Note (Signed)
He has completed all treatment and appears to be recovering well from side effects of treatment I will space out his appointment and will continue to provide supportive care  

## 2016-08-14 NOTE — Progress Notes (Signed)
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 Tumor measures 0.9 cm in greatest dimension. Tumor extends to the inked deep tissue edge. Depth of invasion: 4 mm (in this material) See note.  Note: an immunostains for P16 was performed at an outside hospital and provided for [place] to review; p16 immunostain is negative. Per outside report, In situ hybridization for high-risk HPV types showed no evidence of transcriptionally active high-risk HPV types.      03/21/2016 Initial Diagnosis    Salient findings:  -EAC's clear -Lesion within left buccal mucosa (pictured below) with about 60m depth. Width is about 2/3 of the left buccal mucosal surface area by palpation. Extends to the oral commissure.  -Tongue soft and mobile without mucosal lesions -Neck soft with no palpable neck masses -Right handed -Right hand is pink and warm with Allen's test negative (normal)  -Left forearm very sun damaged with multiple treated areas from precancerous or cancerous areas. Right forearm not as dramatic, presumably from sun exposure while driving      [id number]/1194Surgery    RESECTION OF ORAL BUCCAL MUCOSA SELECTIVE NECK DISSECTION FREE FLAP RADIAL FOREARM PR SPLIT GInkster<100 SQCM [15100] (SPLIT THICKNESS SKIN GRAFT LEG         04/13/2016 Pathology Results    A.LEFT ORAL COMMISSURE INFERIOR, EXCISION: No malignancy identified.  B.LEFT ORAL COMMISSURE SUPERIOR, EXCISION: No malignancy identified.  C.LEFT DEEP ORAL COMMISSURE, EXCISION: No malignancy identified.  D.RIGHT POSTERIOR BUCCAL INFERIOR, EXCISION: No malignancy identified.  E.RIGHT POSTERIOR BUCCAL SUPERIOR, EXCISION: No malignancy identified.  F.ORAL CAVITY, LEFT BUCCAL, EXCISION:  Invasive squamous cell carcinoma, moderately differentiated,  keratinizing. Tumor size:2.2 cm. Tumor depth of invasion:0.8 cm. Perineural invasion identified. Margins negative for malignancy. Pathologic stage:pT2 pN2a. See tumor protocol summary below.  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 Tumor deposit size:1.1 cm. Extranodal extension present. Benign salivary gland tissue.  SURGICAL PATHOLOGY CANCER CASE SUMMARY (AJCC 8TH EDITION) LIP AND ORAL CAVITY CAP Protocol posting date: June, 2017 Version: LipOralCavity 4.0.0.0  PROCEDURE: Wide resection of buccal mucosa TUMOR SITE:Oral:Buccal mucosa TUMOR LATERALITY: Left TUMOR FOCALITY: Unifocal TUMOR SIZE:Greatest dimension: 2.2 cm TUMOR DEPTH OF INVASION (DOI):8 mm HISTOLOGIC TYPE:Squamous cell carcinoma, conventional HISTOLOGIC GRADE:G2: Moderately differentiated SPECIMEN MARGINS:Uninvolved by invasive carcinoma  DISTANCE FROM CLOSEST MARGIN:1 mm  SPECIFY MARGIN(S):Deep LYMPHOVASCULAR INVASION:Present PERINEURAL INVASION: Present REGIONAL LYMPH NODES: NUMBER OF LYMPH NODES INVOLVED: 1 NUMBER OF LYMPH NODES EXAMINED: 4 LATERALITY OF LYMPH NODES INVOLVED: Ipsilateral SIZE OF LARGEST METASTATIC DEPOSIT:1.1 cm EXTRANODAL EXTENSION:Present PATHOLOGIC STAGE CLASSIFICATION (pTNM, AJCC 8th  Ed): pT2 pN2a pT2: umor >2 cm but <=4 cm and <=10 mm DOI pN2a:Metastasis in a single ipsilateral lymph node, 3 cm or  smaller in greatest dimension and ENE(+) BIOMARKERS:performed on prior outside biopsy ([facility] case [id number] outside case [id number] P16 (Immunohistochemistry):Negative HPV (high-risk types by in situ hybridization):Reported as negative      05/11/2016 Imaging    1.  4 mm left lower lobe pulmonary nodule. Attention on follow-up imaging recommended as metastatic disease not excluded. 2. 2.8 cm left thyroid nodule. Thyroid ultrasound recommended to further evaluate. 3. Bilateral renal cysts. 4. Coronary artery and thoracoabdominal aortic atherosclerosis. 5. Ankylosing spondylitis.        05/11/2016 Imaging    CT neck: Surgical clips in the region of the left buccal mucosa, presumably at the previous primary site. Mild scarring in that region measuring about 8 mm. Cannot assess for residual or recurrent disease in that location, but this may simply be scarring. Previous left submandibular resection and left neck node dissection. No abnormal nodes presently. Multinodular goiter. Ankylosing spondylitis.  C1-2 remains a mobile segment.      05/21/2016 Procedure    1. Successful placement of a right internal jugular approach power injectable Port-A-Cath. The Port a catheter is ready for immediate use. 2. Successful fluoroscopic insertion of a 20-French pull-through gastrostomy tube. The gastrostomy may be used immediately for medication administration and may be utilized in 24 hrs for the initiation of feeds.      05/23/2016 - 07/09/2016 Radiation Therapy    He received radiation treatment       05/25/2016 - 06/15/2016 Chemotherapy    He received weekly reduced dose cisplatin       06/21/2016 Adverse Reaction    Cycle 5 is delayed due to pancytopenia      06/26/2016 - 06/29/2016 Hospital Admission    He was admitted for management of neutropenic  fever       INTERVAL HISTORY: Please see below for problem oriented charting. He is seen in the office He is doing better He has lost a bit of weight but overall he feels a bit stronger He is active with most activities of daily living He denies recent worsening diarrhea He continues to have dysphagia and has seen speech and language therapist He is able to tolerate some soft diet He is still using his feeding tube for nutritional supplement Denies pain or nausea  REVIEW OF SYSTEMS:   Constitutional: Denies fevers, chills Eyes: Denies blurriness of vision Ears, nose, mouth, throat, and face: Denies mucositis or sore throat Respiratory: Denies cough, dyspnea or wheezes Cardiovascular: Denies palpitation, chest discomfort or lower extremity swelling Gastrointestinal:  Denies nausea, heartburn or change in bowel habits Skin: Denies abnormal skin rashes Lymphatics: Denies new lymphadenopathy or easy bruising Neurological:Denies numbness, tingling or new weaknesses Behavioral/Psych: Mood is stable, no new changes  All other systems were reviewed with the patient and are negative.  I have reviewed the past medical history, past surgical history, social history and family history with the patient and they are unchanged from previous note.  ALLERGIES:  has No Known Allergies.  MEDICATIONS:  Current Outpatient Prescriptions  Medication Sig Dispense Refill  . aspirin EC 81 MG tablet Take 81 mg by mouth daily.    Marland Kitchen atorvastatin (LIPITOR) 80 MG tablet Take 80 mg by mouth at bedtime.    . balsalazide (COLAZAL) 750 MG capsule Take 3 capsules (2,250 mg total) by mouth 3 (three) times daily. 270 capsule 3  . cyanocobalamin (,VITAMIN B-12,) 1000 MCG/ML injection INJECT 1ML ONCE MONTHLY. 3 mL 1  . isosorbide mononitrate (ISMO,MONOKET) 20 MG tablet Crush 2 tablets (40 mg total) by mouth 2 (two) times daily at 10 AM and 5 PM. 120 tablet 11  . magic mouthwash w/lidocaine SOLN Take 10 mLs by  mouth 4 (four) times daily as needed for mouth pain. 240 mL 0  .  mercaptopurine (PURINETHOL) 50 MG tablet Take 1 tablet (50 mg total) by mouth daily. Give on an empty stomach 1 hour before or 2 hours after meals. Caution: Chemotherapy. 30 tablet 1  . metFORMIN (GLUCOPHAGE) 500 MG tablet Place 1 tablet (500 mg total) into feeding tube 2 (two) times daily with a meal. 60 tablet 0  . nitroGLYCERIN (NITROSTAT) 0.4 MG SL tablet Place 0.4 mg under the tongue every 5 (five) minutes as needed for chest pain.    . Nutritional Supplements (FEEDING SUPPLEMENT, OSMOLITE 1.5 CAL,) LIQD Give 1 and 1/2 cans Osmolite 1.5 via PEG 4 times daily with 60 mL free water before and after bolus feedings. In addition, drink or flush PEG with 240 mL free water TID. Send formula and supplies. 6 Bottle 0  . Sennosides (SENEXON PO) Place 1 packet into feeding tube daily as needed (constipation).     No current facility-administered medications for this visit.     PHYSICAL EXAMINATION: ECOG PERFORMANCE STATUS: 1 - Symptomatic but completely ambulatory  Vitals:   08/13/16 1401  BP: 113/64  Pulse: 77  Resp: 18  Temp: 97.8 F (36.6 C)  SpO2: 100%   Filed Weights   08/13/16 1401  Weight: 175 lb (79.4 kg)    GENERAL:alert, no distress and comfortable SKIN: skin color, texture, turgor are normal, no rashes or significant lesions EYES: normal, Conjunctiva are pink and non-injected, sclera clear OROPHARYNX:no exudate, no erythema and lips, buccal mucosa, and tongue normal  NECK: supple, thyroid normal size, non-tender, without nodularity LYMPH:  no palpable lymphadenopathy in the cervical, axillary or inguinal LUNGS: clear to auscultation and percussion with normal breathing effort HEART: regular rate & rhythm and no murmurs and no lower extremity edema ABDOMEN:abdomen soft, non-tender and normal bowel sounds.  Feeding tube site looks okay Musculoskeletal:no cyanosis of digits and no clubbing  NEURO: alert & oriented  x 3 with fluent speech, no focal motor/sensory deficits  LABORATORY DATA:  I have reviewed the data as listed    Component Value Date/Time   NA 137 08/13/2016 1117   NA 141 07/24/2016 1347   K 4.4 08/13/2016 1117   K 5.3 (H) 07/24/2016 1347   CL 101 08/13/2016 1117   CO2 32 08/13/2016 1117   CO2 30 (H) 07/24/2016 1347   GLUCOSE 209 (H) 08/13/2016 1117   GLUCOSE 173 (H) 07/24/2016 1347   GLUCOSE 98 12/12/2005 0729   BUN 19 08/13/2016 1117   BUN 23.0 07/24/2016 1347   CREATININE 0.82 08/13/2016 1117   CREATININE 1.0 07/24/2016 1347   CALCIUM 9.5 08/13/2016 1117   CALCIUM 9.5 07/24/2016 1347   PROT 6.3 08/13/2016 1117   PROT 6.6 07/24/2016 1347   ALBUMIN 3.9 08/13/2016 1117   ALBUMIN 3.5 07/24/2016 1347   AST 16 08/13/2016 1117   AST 17 07/24/2016 1347   ALT 8 08/13/2016 1117   ALT 7 07/24/2016 1347   ALKPHOS 66 08/13/2016 1117   ALKPHOS 68 07/24/2016 1347   BILITOT 0.7 08/13/2016 1117   BILITOT 0.48 07/24/2016 1347   GFRNONAA >60 06/29/2016 0454   GFRAA >60 06/29/2016 0454    No results found for: SPEP, UPEP  Lab Results  Component Value Date   WBC 3.0 (L) 08/13/2016   NEUTROABS 2.0 08/13/2016   HGB 11.0 (L) 08/13/2016   HCT 33.1 (L) 08/13/2016   MCV 101.7 (H) 08/13/2016   PLT 131.0 (L) 08/13/2016      Chemistry      Component Value Date/Time   NA  137 08/13/2016 1117   NA 141 07/24/2016 1347   K 4.4 08/13/2016 1117   K 5.3 (H) 07/24/2016 1347   CL 101 08/13/2016 1117   CO2 32 08/13/2016 1117   CO2 30 (H) 07/24/2016 1347   BUN 19 08/13/2016 1117   BUN 23.0 07/24/2016 1347   CREATININE 0.82 08/13/2016 1117   CREATININE 1.0 07/24/2016 1347      Component Value Date/Time   CALCIUM 9.5 08/13/2016 1117   CALCIUM 9.5 07/24/2016 1347   ALKPHOS 66 08/13/2016 1117   ALKPHOS 68 07/24/2016 1347   AST 16 08/13/2016 1117   AST 17 07/24/2016 1347   ALT 8 08/13/2016 1117   ALT 7 07/24/2016 1347   BILITOT 0.7 08/13/2016 1117   BILITOT 0.48 07/24/2016 1347        RADIOGRAPHIC STUDIES: I have personally reviewed the radiological images as listed and agreed with the findings in the report. Dg Op Swallowing Func-medicare/speech Path  Result Date: 08/06/2016 Objective Swallowing Evaluation: Type of Study: MBS-Modified Barium Swallow Study Patient Details Name: CRAYTON SAVARESE MRN: [id number] Date of Birth: September 12, 1940 Today's Date: 08/06/2016 Time: SLP Start Time (ACUTE ONLY): 1325-SLP Stop Time (ACUTE ONLY): 1405 SLP Time Calculation (min) (ACUTE ONLY): 40 min Past Medical History: Past Medical History: Diagnosis Date . Ankylosing spondylitis ([place])  . Blood transfusion   hx of transfusion without reaction . Bradycardia  . Cancer ([place]) 03/2016  oral . Cholelithiasis  . Coronary artery disease   a. s/p CABG x 6 in 1997 (LIMA->LAD, VG->RI ->OM1->OM2, VG->PDA->PLV // b. 05/2011 Cath:  patent grafs, native prox rca and d2 dzs  ->med rx. // c. Myoview 1/18: not gated, large inferolateral scar, no ischemia; Intermediate Risk (IL scar old - on prior studies >> med rx) . Crohn's colitis ([place]) 12/01/1997 . Diabetes mellitus  . Dysphagia  . Fatty liver  . GERD (gastroesophageal reflux disease)  . Heart block  . Hiatal hernia 12/03/2008 . History of radiation therapy 05/23/16- 07/09/16  Left Cheek/ 60 Gy in 30 fractions to gross disease, 63 Gy in 35 fractions to high risk nodal echelons, and 56 Gy in 35 fractions to intermedicate risk nodal echelons.  . History of skin cancer  . Hx of echocardiogram   Echo 5/14:  Mild LVH, EF 55-60%, NL diast fxn, mild LAE  . Hyperlipidemia  . Hypertension  . Kidney stones  . Neck pain  . Pneumonia   hx of PNA . Squamous cell cancer of buccal mucosa ([place])  . 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 cancer removal  2014  removed from left outter leg . COLONOSCOPY  2012 . CORONARY ARTERY BYPASS GRAFT  04/19/1995  x7 . EXCISION ORAL TUMOR Left   left jaw and lymph node .  HERNIA REPAIR  1989 . IR FLUORO GUIDE PORT INSERTION RIGHT  05/21/2016 . IR GASTROSTOMY TUBE MOD SED  05/21/2016 . IR US GUIDE VASC ACCESS RIGHT  05/21/2016 . LEFT HEART CATHETERIZATION WITH CORONARY/GRAFT ANGIOGRAM N/A 05/24/2011  Procedure: LEFT HEART CATHETERIZATION WITH Beatrix Fetters;  Surgeon: Larey Dresser, MD;  Location: [facility] CATH LAB;  Service: Cardiovascular;  Laterality: N/A; . MOHS SURGERY    X 2 off chin and nose . TONSILLECTOMY AND ADENOIDECTOMY  1944 . TRIGGER FINGER RELEASE   . UMBILICAL HERNIA REPAIR   HPI: 76 yo male referred for MBS due to dysphagia complaints.  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  Pt reported dysphagia to his MD/health care providers.  He has a PEG and had not consumed po since 07/26/2016 due to dysphagia. He reports sensing a "knot" in his throat, having excessive secretions he consistently expectorates and odynophagia.  dynophagia/mucositis.  Pt also states he chokes on water easily and "can't swallow".  Dr Owens Shark (ENT) at [facility] reported concern for radiation esophagitis and desired to defer upper endoscopy to allow for more improvement/healing.  Concern also present for oral thrush per notes 06/07/16 and pt uses mouth rinse with baking soda and salt.  Pt also had mucositis and left lateral tongue erythema per Dr Pearlie Oyster 07/24/16 note. Soft tissue neck image 05/11/16 showed left submandibular resection, multinodular goiter, ankylosing spondylitis.  G tube and port placed 05/21/16.  PMH also + for ulcerative colitis, CAD s/p CABG.  Pt was hospitalized 6/26-6/29/18 for pancytopenia.  Subjective: pt awake in chair Assessment / Plan / Recommendation CHL IP CLINICAL IMPRESSIONS 08/06/2016 Clinical Impression Pt presents with moderate oropharyngeal dysphagia likely due to surgery and radiation.  Lingual pumping with  delayed oral transiting noted - limited lingual ROM apparent - but no significant oral residuals present.  Pharyngeal swallow was strong with small amounts of barium *1/2 tsp amounts*.  However pt did have laryngeal penetration of cup bolus of thin due to decreased timing and adequacy of laryngeal closure. Laryngeal penetration noted during and after the swallow-during - timing issue and after due to pyriform sinus residuals spilling into open larynx.  Chin tuck and head turn not tested due to pt's neck discomfort, ankylosing spondylosis likely contributing.  Pt did NOT aspirate/penetrate tsp amounts of all boluses. He did cough and expectorate secretions throughout MBS.  Advised pt and spouse for pt to continue po intake via tsp - thin, nectar, puree, soft solids with strict precautions.  Fortunately pt is sensate to laryngeal penetration.  Using diagram, pt and wife extensively educated and sample packets of thickener provided for their use.  Recommended he avoid diary due to secretions and acidic items due to possible discomfort from radiation.  Recommend follow up with OP SLP and consider repeat MBS in future if indicated.  Thanks for this referral of this most pleasant patient.  SLP Visit Diagnosis Dysphagia, oropharyngeal phase (R13.12);Dysphagia, pharyngoesophageal phase (R13.14) Attention and concentration deficit following -- Frontal lobe and executive function deficit following -- Impact on safety and function --   CHL IP TREATMENT RECOMMENDATION 08/06/2016 Treatment Recommendations Defer treatment plan to f/u with SLP   No flowsheet data found. CHL IP DIET RECOMMENDATION 08/06/2016 SLP Diet Recommendations Dysphagia 3 (Mech soft) solids;Dysphagia 1 (Puree) solids;Nectar thick liquid;Thin liquid Liquid Administration via Spoon Medication Administration Via alternative means Compensations Minimize environmental distractions;Slow rate;Small sips/bites;Effortful swallow; Stop intake if coughing; Swish and  expectorate with water before and after meals - and throughout the day Postural Changes Remain semi-upright after after feeds/meals (Comment);Seated upright at 90 degrees   CHL IP OTHER RECOMMENDATIONS 08/06/2016 Recommended Consults -- Oral Care Recommendations Oral care QID;Oral care before and after PO Other Recommendations Order thickener from pharmacy   CHL IP FOLLOW UP RECOMMENDATIONS 08/06/2016 Follow up Recommendations Outpatient SLP   No flowsheet data found.     CHL IP ORAL PHASE 08/06/2016 Oral Phase Impaired Oral - Pudding Teaspoon -- Oral - Pudding Cup -- Oral - Honey Teaspoon -- Oral - Honey Cup -- Oral - Nectar Teaspoon Lingual pumping;Holding  of bolus;Delayed oral transit Oral - Nectar Cup -- Oral - Nectar Straw -- Oral - Thin Teaspoon Lingual pumping;Holding of bolus;Delayed oral transit Oral - Thin Cup Lingual pumping;Holding of bolus;Delayed oral transit Oral - Thin Straw -- Oral - Puree Lingual pumping;Holding of bolus;Delayed oral transit Oral - Mech Soft Impaired mastication;Lingual pumping;Holding of bolus;Delayed oral transit Oral - Regular -- Oral - Multi-Consistency -- Oral - Pill -- Oral Phase - Comment patient oral holding noted with anterior organization of boluses, decreased lingual ROM/strength, facial ROM/strength due to surgery/chemo  CHL IP PHARYNGEAL PHASE 08/06/2016 Pharyngeal Phase Impaired Pharyngeal- Pudding Teaspoon -- Pharyngeal -- Pharyngeal- Pudding Cup -- Pharyngeal -- Pharyngeal- Honey Teaspoon -- Pharyngeal -- Pharyngeal- Honey Cup -- Pharyngeal -- Pharyngeal- Nectar Teaspoon WFL Pharyngeal -- Pharyngeal- Nectar Cup -- Pharyngeal -- Pharyngeal- Nectar Straw -- Pharyngeal -- Pharyngeal- Thin Teaspoon WFL Pharyngeal -- Pharyngeal- Thin Cup Reduced anterior laryngeal mobility;Reduced laryngeal elevation;Reduced airway/laryngeal closure;Penetration/Aspiration during swallow;Penetration/Apiration after swallow;Pharyngeal residue - pyriform Pharyngeal Material enters airway, remains  ABOVE vocal cords then ejected out;Material enters airway, CONTACTS cords and then ejected out Pharyngeal- Thin Straw -- Pharyngeal -- Pharyngeal- Puree WFL Pharyngeal -- Pharyngeal- Mechanical Soft WFL Pharyngeal -- Pharyngeal- Regular -- Pharyngeal -- Pharyngeal- Multi-consistency -- Pharyngeal -- Pharyngeal- Pill -- Pharyngeal -- Pharyngeal Comment pt moved out of flouro when cough was elicited during penetration of thin liquid via cup, mild amount of pyriform sinus residuals spilled into open airway after swallow also eliciting cough, small tsps of barium without aspiration/penetration  CHL IP CERVICAL ESOPHAGEAL PHASE 08/06/2016 Cervical Esophageal Phase Impaired Pudding Teaspoon -- Pudding Cup -- Honey Teaspoon -- Honey Cup -- Nectar Teaspoon WFL Nectar Cup -- Nectar Straw -- Thin Teaspoon WFL Thin Cup -- Thin Straw -- Puree WFL Mechanical Soft WFL Regular -- Multi-consistency -- Pill -- Cervical Esophageal Comment mild barium residuals appearing below UES - please see images on xray CHL IP GO 08/06/2016 Functional Assessment Tool Used MBS, clinical judgement Functional Limitations Swallowing Swallow Current Status (Z6109) CJ Swallow Goal Status (U0454) CJ Swallow Discharge Status [phone]) CJ Macario Golds 08/06/2016, 3:25 PM Luanna Salk, MS [facility] SLP [phone]            CLINICAL DATA:  Head and neck cancer status post resection and radiation therapy, with difficulty swallowing. EXAM: MODIFIED BARIUM SWALLOW TECHNIQUE: Different consistencies of barium were administered orally to the patient by the Speech Pathologist. Imaging of the pharynx was performed in the lateral projection. FLUOROSCOPY TIME:  Fluoroscopy Time:  2.2 minutes Radiation Exposure Index (if provided by the fluoroscopic device): 6.4 mGy Number of Acquired Spot Images: 0 COMPARISON:  None. FINDINGS: Thin liquid- smaller volume swallows appeared normal. With a large volume swallow from a cup, there was laryngeal penetration with a strong  coughing response. Nectar thick liquid- within normal limits Pure- within normal limits Pure with cracker- within normal limits Incidental note is made of cervicothoracic vertebral findings characteristic of ankylosing spondylitis. IMPRESSION: 1. Smaller volume swallows with thin and nectar thick liquid and puree appear relatively normal. With a larger volume swallow of thin liquid, the patient had considerable laryngeal penetration resulting in a strong coughing response. 2. Vertebral findings compatible with ankylosing spondylitis. Please refer to the Speech Pathologists report for complete details and recommendations. Electronically Signed   By: Van Clines M.D.   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[place]) He has completed all treatment and appears to be recovering well from side effects  of treatment I will space out his appointment and will continue to provide supportive care   Acquired pancytopenia ([place]) His blood counts are stable and improving Continue close observation The patient is currently started on mercaptopurine that could also cause pancytopenia  Dysphagia He has persistent dysphagia He will continue close follow-up with speech and language therapist I encouraged him to attend oral intake as much as possible with effort to wean him off his feeding tube  Weight loss He has recent weight loss I encouraged him to increase oral intake as tolerated My goal would be to try to wean him off his feeding tube by next month.   No orders of the defined types were placed in this encounter.  All questions were answered. The patient knows to call the clinic with any problems, questions or concerns. No barriers to learning was detected. I spent 15 minutes counseling the patient face to face. The total time spent in the appointment was 20 minutes and more than 50% was on counseling and review of test results     [name],  MD 08/14/2016 6:56 AM

## 2016-08-14 NOTE — Assessment & Plan Note (Signed)
He has persistent dysphagia He will continue close follow-up with speech and language therapist I encouraged him to attend oral intake as much as possible with effort to wean him off his feeding tube

## 2016-08-14 NOTE — Telephone Encounter (Signed)
I spoke to the patient's wife briefly today and to Mr. Kabel regarding his dysphagia. I got a call from Dr. Verl Blalock who is a personal friend and asked that I check on Mr. Lupton He reports that he is doing well. He had a modified barium swallow and speech and swallow evaluation last week. Swallowing is slowly improving. On review he is having primarily pharyngeal dysphagia with pooling of barium and also some laryngeal penetration which he is aware of and able to clear. He feels that swallowing is slowly improving. He ate soup last night and has had applesauce today. He followed up yesterday with Dr. Alvy Bimler I made him aware that I remain available should we need to further investigate esophageal dysphagia though in this case I expect he is still dealing with acute esophagitis which has little treatment. He is not having heartburn so I'm holding off on starting PPI. If esophageal dysphagia becomes more of a question and would start with barium swallow to evaluate for soft gel strictures which can be seen as a late complication of radiation therapy

## 2016-08-14 NOTE — Assessment & Plan Note (Signed)
His blood counts are stable and improving Continue close observation The patient is currently started on mercaptopurine that could also cause pancytopenia

## 2016-08-14 NOTE — Assessment & Plan Note (Signed)
He has recent weight loss I encouraged him to increase oral intake as tolerated My goal would be to try to wean him off his feeding tube by next month.

## 2016-08-23 ENCOUNTER — Ambulatory Visit (INDEPENDENT_AMBULATORY_CARE_PROVIDER_SITE_OTHER): Payer: PPO | Admitting: Internal Medicine

## 2016-08-23 ENCOUNTER — Encounter: Payer: Self-pay | Admitting: Internal Medicine

## 2016-08-23 VITALS — BP 120/68 | HR 84 | Temp 97.6°F | Resp 14 | Ht 68.0 in | Wt 177.1 lb

## 2016-08-23 DIAGNOSIS — M21371 Foot drop, right foot: Secondary | ICD-10-CM | POA: Diagnosis not present

## 2016-08-23 DIAGNOSIS — I10 Essential (primary) hypertension: Secondary | ICD-10-CM

## 2016-08-23 DIAGNOSIS — E118 Type 2 diabetes mellitus with unspecified complications: Secondary | ICD-10-CM

## 2016-08-23 NOTE — Progress Notes (Signed)
Subjective:  Patient ID: Andres Smith, male    DOB: [birth year]  Age: 76 y.o. MRN: [id number]  CC: Hypertension and Diabetes   HPI Andres Smith presents for f/up - he complains that he has been dragging his right foot for the last 5 months. He wants to see a surgeon to consider surgical treatment options. He has chronic LBP that has not recently worsened. He offers no other complaints today.   Outpatient Medications Prior to Visit  Medication Sig Dispense Refill  . aspirin EC 81 MG tablet Take 81 mg by mouth daily.    Marland Kitchen atorvastatin (LIPITOR) 80 MG tablet Take 80 mg by mouth at bedtime.    . balsalazide (COLAZAL) 750 MG capsule Take 3 capsules (2,250 mg total) by mouth 3 (three) times daily. 270 capsule 3  . cyanocobalamin (,VITAMIN B-12,) 1000 MCG/ML injection INJECT 1ML ONCE MONTHLY. 3 mL 1  . isosorbide mononitrate (ISMO,MONOKET) 20 MG tablet Crush 2 tablets (40 mg total) by mouth 2 (two) times daily at 10 AM and 5 PM. 120 tablet 11  . magic mouthwash w/lidocaine SOLN Take 10 mLs by mouth 4 (four) times daily as needed for mouth pain. 240 mL 0  . mercaptopurine (PURINETHOL) 50 MG tablet Take 1 tablet (50 mg total) by mouth daily. Give on an empty stomach 1 hour before or 2 hours after meals. Caution: Chemotherapy. 30 tablet 1  . metFORMIN (GLUCOPHAGE) 500 MG tablet Place 1 tablet (500 mg total) into feeding tube 2 (two) times daily with a meal. 60 tablet 0  . nitroGLYCERIN (NITROSTAT) 0.4 MG SL tablet Place 0.4 mg under the tongue every 5 (five) minutes as needed for chest pain.    . Nutritional Supplements (FEEDING SUPPLEMENT, OSMOLITE 1.5 CAL,) LIQD Give 1 and 1/2 cans Osmolite 1.5 via PEG 4 times daily with 60 mL free water before and after bolus feedings. In addition, drink or flush PEG with 240 mL free water TID. Send formula and supplies. 6 Bottle 0  . Sennosides (SENEXON PO) Place 1 packet into feeding tube daily as needed (constipation).     No facility-administered medications  prior to visit.     ROS Review of Systems  Constitutional: Negative for appetite change, chills, diaphoresis, fatigue and fever.  HENT: Negative.  Negative for facial swelling, sore throat and trouble swallowing.   Eyes: Negative.  Negative for visual disturbance.  Respiratory: Negative.  Negative for cough, chest tightness, shortness of breath and wheezing.   Cardiovascular: Negative for chest pain, palpitations and leg swelling.  Gastrointestinal: Negative for abdominal pain, constipation, diarrhea, nausea and vomiting.  Endocrine: Negative.  Negative for polydipsia, polyphagia and polyuria.  Genitourinary: Negative.  Negative for decreased urine volume, difficulty urinating and urgency.  Musculoskeletal: Positive for back pain. Negative for myalgias and neck pain.  Skin: Negative.  Negative for color change and rash.  Allergic/Immunologic: Negative.   Neurological: Positive for weakness (rt foot only). Negative for dizziness, light-headedness, numbness and headaches.  Hematological: Negative for adenopathy. Does not bruise/bleed easily.  Psychiatric/Behavioral: Negative.  Negative for suicidal ideas.    Objective:  BP 120/68 (BP Location: Left Arm, Patient Position: Sitting, Cuff Size: Normal)   Pulse 84   Temp 97.6 F (36.4 C) (Oral)   Resp 14   Ht 5\' 8"  (1.727 m)   Wt 177 lb 1.9 oz (80.3 kg)   SpO2 96%   BMI 26.93 kg/m   BP Readings from Last 3 Encounters:  08/23/16 120/68  08/13/16 113/64  07/26/16 118/64    Wt Readings from Last 3 Encounters:  08/23/16 177 lb 1.9 oz (80.3 kg)  08/13/16 175 lb (79.4 kg)  07/26/16 179 lb 4 oz (81.3 kg)    Physical Exam  Constitutional: He is oriented to person, place, and time. No distress.  HENT:  Mouth/Throat: Oropharynx is clear and moist. No oropharyngeal exudate.  Eyes: Conjunctivae are normal. Right eye exhibits no discharge. Left eye exhibits no discharge. No scleral icterus.  Neck: Normal range of motion. Neck supple. No  JVD present. No thyromegaly present.  Cardiovascular: Normal rate, regular rhythm and intact distal pulses.  Exam reveals no gallop and no friction rub.   No murmur heard. Pulmonary/Chest: Effort normal and breath sounds normal. No respiratory distress. He has no wheezes. He has no rales. He exhibits no tenderness.  Abdominal: Soft. Bowel sounds are normal. He exhibits no distension and no mass. There is no tenderness. There is no rebound and no guarding.  Musculoskeletal: Normal range of motion. He exhibits no edema, tenderness or deformity.  Lymphadenopathy:    He has no cervical adenopathy.  Neurological: He is alert and oriented to person, place, and time. He has normal reflexes. He displays normal reflexes. No cranial nerve deficit. He exhibits abnormal muscle tone. Coordination normal.  He is unable to dorsiflex his right foot  Skin: Skin is warm and dry. No rash noted. He is not diaphoretic. No erythema. No pallor.  Vitals reviewed.   Lab Results  Component Value Date   WBC 3.0 (L) 08/13/2016   HGB 11.0 (L) 08/13/2016   HCT 33.1 (L) 08/13/2016   PLT 131.0 (L) 08/13/2016   GLUCOSE 209 (H) 08/13/2016   CHOL 136 05/16/2015   TRIG 90.0 05/16/2015   HDL 58.10 05/16/2015   LDLCALC 60 05/16/2015   ALT 8 08/13/2016   AST 16 08/13/2016   NA 137 08/13/2016   K 4.4 08/13/2016   CL 101 08/13/2016   CREATININE 0.82 08/13/2016   BUN 19 08/13/2016   CO2 32 08/13/2016   TSH 0.781 06/06/2016   PSA 2.29 05/06/2012   INR 1.02 05/21/2016   HGBA1C 7.3 (H) 05/16/2015   MICROALBUR 13.2 (H) 05/16/2015    Dg Op Swallowing Func-medicare/speech Path  Result Date: 08/06/2016 Objective Swallowing Evaluation: Type of Study: MBS-Modified Barium Swallow Study Patient Details Name: Andres Smith MRN: [id number] Date of Birth: 09/29/1940 Today's Date: 08/06/2016 Time: SLP Start Time (ACUTE ONLY): 1325-SLP Stop Time (ACUTE ONLY): 1405 SLP Time Calculation (min) (ACUTE ONLY): 40 min Past Medical History: Past  Medical History: Diagnosis Date . Ankylosing spondylitis ([place])  . Blood transfusion   hx of transfusion without reaction . Bradycardia  . Cancer ([place]) 03/2016  oral . Cholelithiasis  . Coronary artery disease   a. s/p CABG x 6 in 1997 (LIMA->LAD, VG->RI ->OM1->OM2, VG->PDA->PLV // b. 05/2011 Cath:  patent grafs, native prox rca and d2 dzs  ->med rx. // c. Myoview 1/18: not gated, large inferolateral scar, no ischemia; Intermediate Risk (IL scar old - on prior studies >> med rx) . Crohn's colitis ([place]) 12/01/1997 . Diabetes mellitus  . Dysphagia  . Fatty liver  . GERD (gastroesophageal reflux disease)  . Heart block  . Hiatal hernia 12/03/2008 . History of radiation therapy 05/23/16- 07/09/16  Left Cheek/ 60 Gy in 30 fractions to gross disease, 63 Gy in 35 fractions to high risk nodal echelons, and 56 Gy in 35 fractions to intermedicate risk nodal echelons.  . History of skin cancer  .  Hx of echocardiogram   Echo 5/14:  Mild LVH, EF 55-60%, NL diast fxn, mild LAE  . Hyperlipidemia  . Hypertension  . Kidney stones  . Neck pain  . Pneumonia   hx of PNA . 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 cancer removal  2014  removed from left outter leg . COLONOSCOPY  2012 . CORONARY ARTERY BYPASS GRAFT  04/19/1995  x7 . EXCISION ORAL TUMOR Left   left jaw and lymph node . HERNIA REPAIR  1989 . IR FLUORO GUIDE PORT INSERTION RIGHT  05/21/2016 . IR GASTROSTOMY TUBE MOD SED  05/21/2016 . IR US GUIDE VASC ACCESS RIGHT  05/21/2016 . LEFT HEART CATHETERIZATION WITH CORONARY/GRAFT ANGIOGRAM N/A 05/24/2011  Procedure: LEFT HEART CATHETERIZATION WITH Beatrix Fetters;  Surgeon: Larey Dresser, MD;  Location: [facility] CATH LAB;  Service: Cardiovascular;  Laterality: N/A; . MOHS SURGERY    X 2 off chin and nose . TONSILLECTOMY AND ADENOIDECTOMY  1944 . TRIGGER FINGER RELEASE   . UMBILICAL HERNIA REPAIR   HPI:  76 yo male referred for MBS due to dysphagia complaints.  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  Pt reported dysphagia to his MD/health care providers.  He has a PEG and had not consumed po since 07/26/2016 due to dysphagia. He reports sensing a "knot" in his throat, having excessive secretions he consistently expectorates and odynophagia.  dynophagia/mucositis.  Pt also states he chokes on water easily and "can't swallow".  Dr Owens Shark (ENT) at [facility] reported concern for radiation esophagitis and desired to defer upper endoscopy to allow for more improvement/healing.  Concern also present for oral thrush per notes 06/07/16 and pt uses mouth rinse with baking soda and salt.  Pt also had mucositis and left lateral tongue erythema per Dr Pearlie Oyster 07/24/16 note. Soft tissue neck image 05/11/16 showed left submandibular resection, multinodular goiter, ankylosing spondylitis.  G tube and port placed 05/21/16.  PMH also + for ulcerative colitis, CAD s/p CABG.  Pt was hospitalized 6/26-6/29/18 for pancytopenia.  Subjective: pt awake in chair Assessment / Plan / Recommendation CHL IP CLINICAL IMPRESSIONS 08/06/2016 Clinical Impression Pt presents with moderate oropharyngeal dysphagia likely due to surgery and radiation.  Lingual pumping with delayed oral transiting noted - limited lingual ROM apparent - but no significant oral residuals present.  Pharyngeal swallow was strong with small amounts of barium *1/2 tsp amounts*.  However pt did have laryngeal penetration of cup bolus of thin due to decreased timing and adequacy of laryngeal closure. Laryngeal penetration noted during and after the swallow-during - timing issue and after due to pyriform sinus residuals spilling into open larynx.  Chin tuck and head turn not tested due to pt's neck discomfort, ankylosing  spondylosis likely contributing.  Pt did NOT aspirate/penetrate tsp amounts of all boluses. He did cough and expectorate secretions throughout MBS.  Advised pt and spouse for pt to continue po intake via tsp - thin, nectar, puree, soft solids with strict precautions.  Fortunately pt is sensate to laryngeal penetration.  Using diagram, pt and wife extensively educated and sample packets of thickener provided for their use.  Recommended he avoid diary due to secretions and acidic items due to possible discomfort from radiation.  Recommend follow up with OP SLP and consider  repeat MBS in future if indicated.  Thanks for this referral of this most pleasant patient.  SLP Visit Diagnosis Dysphagia, oropharyngeal phase (R13.12);Dysphagia, pharyngoesophageal phase (R13.14) Attention and concentration deficit following -- Frontal lobe and executive function deficit following -- Impact on safety and function --   CHL IP TREATMENT RECOMMENDATION 08/06/2016 Treatment Recommendations Defer treatment plan to f/u with SLP   No flowsheet data found. CHL IP DIET RECOMMENDATION 08/06/2016 SLP Diet Recommendations Dysphagia 3 (Mech soft) solids;Dysphagia 1 (Puree) solids;Nectar thick liquid;Thin liquid Liquid Administration via Spoon Medication Administration Via alternative means Compensations Minimize environmental distractions;Slow rate;Small sips/bites;Effortful swallow; Stop intake if coughing; Swish and expectorate with water before and after meals - and throughout the day Postural Changes Remain semi-upright after after feeds/meals (Comment);Seated upright at 90 degrees   CHL IP OTHER RECOMMENDATIONS 08/06/2016 Recommended Consults -- Oral Care Recommendations Oral care QID;Oral care before and after PO Other Recommendations Order thickener from pharmacy   CHL IP FOLLOW UP RECOMMENDATIONS 08/06/2016 Follow up Recommendations Outpatient SLP   No flowsheet data found.     CHL IP ORAL PHASE 08/06/2016 Oral Phase Impaired Oral - Pudding  Teaspoon -- Oral - Pudding Cup -- Oral - Honey Teaspoon -- Oral - Honey Cup -- Oral - Nectar Teaspoon Lingual pumping;Holding of bolus;Delayed oral transit Oral - Nectar Cup -- Oral - Nectar Straw -- Oral - Thin Teaspoon Lingual pumping;Holding of bolus;Delayed oral transit Oral - Thin Cup Lingual pumping;Holding of bolus;Delayed oral transit Oral - Thin Straw -- Oral - Puree Lingual pumping;Holding of bolus;Delayed oral transit Oral - Mech Soft Impaired mastication;Lingual pumping;Holding of bolus;Delayed oral transit Oral - Regular -- Oral - Multi-Consistency -- Oral - Pill -- Oral Phase - Comment patient oral holding noted with anterior organization of boluses, decreased lingual ROM/strength, facial ROM/strength due to surgery/chemo  CHL IP PHARYNGEAL PHASE 08/06/2016 Pharyngeal Phase Impaired Pharyngeal- Pudding Teaspoon -- Pharyngeal -- Pharyngeal- Pudding Cup -- Pharyngeal -- Pharyngeal- Honey Teaspoon -- Pharyngeal -- Pharyngeal- Honey Cup -- Pharyngeal -- Pharyngeal- Nectar Teaspoon WFL Pharyngeal -- Pharyngeal- Nectar Cup -- Pharyngeal -- Pharyngeal- Nectar Straw -- Pharyngeal -- Pharyngeal- Thin Teaspoon WFL Pharyngeal -- Pharyngeal- Thin Cup Reduced anterior laryngeal mobility;Reduced laryngeal elevation;Reduced airway/laryngeal closure;Penetration/Aspiration during swallow;Penetration/Apiration after swallow;Pharyngeal residue - pyriform Pharyngeal Material enters airway, remains ABOVE vocal cords then ejected out;Material enters airway, CONTACTS cords and then ejected out Pharyngeal- Thin Straw -- Pharyngeal -- Pharyngeal- Puree WFL Pharyngeal -- Pharyngeal- Mechanical Soft WFL Pharyngeal -- Pharyngeal- Regular -- Pharyngeal -- Pharyngeal- Multi-consistency -- Pharyngeal -- Pharyngeal- Pill -- Pharyngeal -- Pharyngeal Comment pt moved out of flouro when cough was elicited during penetration of thin liquid via cup, mild amount of pyriform sinus residuals spilled into open airway after swallow also  eliciting cough, small tsps of barium without aspiration/penetration  CHL IP CERVICAL ESOPHAGEAL PHASE 08/06/2016 Cervical Esophageal Phase Impaired Pudding Teaspoon -- Pudding Cup -- Honey Teaspoon -- Honey Cup -- Nectar Teaspoon WFL Nectar Cup -- Nectar Straw -- Thin Teaspoon WFL Thin Cup -- Thin Straw -- Puree WFL Mechanical Soft WFL Regular -- Multi-consistency -- Pill -- Cervical Esophageal Comment mild barium residuals appearing below UES - please see images on xray CHL IP GO 08/06/2016 Functional Assessment Tool Used MBS, clinical judgement Functional Limitations Swallowing Swallow Current Status (I7124) CJ Swallow Goal Status (P8099) [employer] Discharge Status [phone]) CJ Macario Golds 08/06/2016, 3:25 PM Luanna Salk, MS [facility] SLP [phone]            CLINICAL DATA:  Head  and neck cancer status post resection and radiation therapy, with difficulty swallowing. EXAM: MODIFIED BARIUM SWALLOW TECHNIQUE: Different consistencies of barium were administered orally to the patient by the Speech Pathologist. Imaging of the pharynx was performed in the lateral projection. FLUOROSCOPY TIME:  Fluoroscopy Time:  2.2 minutes Radiation Exposure Index (if provided by the fluoroscopic device): 6.4 mGy Number of Acquired Spot Images: 0 COMPARISON:  None. FINDINGS: Thin liquid- smaller volume swallows appeared normal. With a large volume swallow from a cup, there was laryngeal penetration with a strong coughing response. Nectar thick liquid- within normal limits Pure- within normal limits Pure with cracker- within normal limits Incidental note is made of cervicothoracic vertebral findings characteristic of ankylosing spondylitis. IMPRESSION: 1. Smaller volume swallows with thin and nectar thick liquid and puree appear relatively normal. With a larger volume swallow of thin liquid, the patient had considerable laryngeal penetration resulting in a strong coughing response. 2. Vertebral findings compatible with ankylosing  spondylitis. Please refer to the Speech Pathologists report for complete details and recommendations. Electronically Signed   By: Van Clines M.D.   On: 08/06/2016 14:26    Assessment & Plan:   Jarmaine was seen today for hypertension and diabetes.  Diagnoses and all orders for this visit:  Foot drop, right- he will see ortho to consider surgical treatment options. -     Ambulatory referral to Orthopedic Surgery  Essential hypertension, benign- His blood pressure is adequately well-controlled  Type 2 diabetes mellitus with complication, without long-term current use of insulin ([place])- I will monitor his A1c to see that his blood sugars are adequately well-controlled. His recent renal function was normal. -     Hemoglobin A1c; Future -     Microalbumin / creatinine urine ratio; Future   I am having Mr. Wesby maintain his cyanocobalamin, atorvastatin, nitroGLYCERIN, aspirin EC, feeding supplement (OSMOLITE 1.5 CAL), balsalazide, Sennosides (SENEXON PO), magic mouthwash w/lidocaine, metFORMIN, isosorbide mononitrate, and mercaptopurine.  No orders of the defined types were placed in this encounter.    Follow-up: Return in about 6 months (around 02/23/2017).  [name], MD

## 2016-08-24 NOTE — Patient Instructions (Signed)

## 2016-08-27 ENCOUNTER — Emergency Department (HOSPITAL_COMMUNITY): Payer: PPO

## 2016-08-27 ENCOUNTER — Encounter (HOSPITAL_COMMUNITY): Payer: Self-pay

## 2016-08-27 ENCOUNTER — Emergency Department (HOSPITAL_COMMUNITY)
Admission: EM | Admit: 2016-08-27 | Discharge: 2016-08-27 | Disposition: A | Discharge status: Home / Self Care | Payer: PPO | Attending: Emergency Medicine | Admitting: Emergency Medicine

## 2016-08-27 DIAGNOSIS — X509XXA Other and unspecified overexertion or strenuous movements or postures, initial encounter: Secondary | ICD-10-CM | POA: Diagnosis not present

## 2016-08-27 DIAGNOSIS — Z85828 Personal history of other malignant neoplasm of skin: Secondary | ICD-10-CM | POA: Diagnosis not present

## 2016-08-27 DIAGNOSIS — I2510 Atherosclerotic heart disease of native coronary artery without angina pectoris: Secondary | ICD-10-CM | POA: Insufficient documentation

## 2016-08-27 DIAGNOSIS — I119 Hypertensive heart disease without heart failure: Secondary | ICD-10-CM | POA: Diagnosis not present

## 2016-08-27 DIAGNOSIS — Z7982 Long term (current) use of aspirin: Secondary | ICD-10-CM | POA: Diagnosis not present

## 2016-08-27 DIAGNOSIS — Z87891 Personal history of nicotine dependence: Secondary | ICD-10-CM | POA: Diagnosis not present

## 2016-08-27 DIAGNOSIS — Z7984 Long term (current) use of oral hypoglycemic drugs: Secondary | ICD-10-CM | POA: Insufficient documentation

## 2016-08-27 DIAGNOSIS — E119 Type 2 diabetes mellitus without complications: Secondary | ICD-10-CM | POA: Diagnosis not present

## 2016-08-27 DIAGNOSIS — Z85818 Personal history of malignant neoplasm of other sites of lip, oral cavity, and pharynx: Secondary | ICD-10-CM | POA: Insufficient documentation

## 2016-08-27 DIAGNOSIS — Y9353 Activity, golf: Secondary | ICD-10-CM | POA: Insufficient documentation

## 2016-08-27 DIAGNOSIS — R0789 Other chest pain: Secondary | ICD-10-CM | POA: Diagnosis not present

## 2016-08-27 DIAGNOSIS — S2241XA Multiple fractures of ribs, right side, initial encounter for closed fracture: Secondary | ICD-10-CM | POA: Diagnosis not present

## 2016-08-27 DIAGNOSIS — Y998 Other external cause status: Secondary | ICD-10-CM | POA: Insufficient documentation

## 2016-08-27 DIAGNOSIS — Y9239 Other specified sports and athletic area as the place of occurrence of the external cause: Secondary | ICD-10-CM | POA: Diagnosis not present

## 2016-08-27 DIAGNOSIS — S2231XA Fracture of one rib, right side, initial encounter for closed fracture: Secondary | ICD-10-CM | POA: Diagnosis not present

## 2016-08-27 DIAGNOSIS — S299XXA Unspecified injury of thorax, initial encounter: Secondary | ICD-10-CM | POA: Diagnosis present

## 2016-08-27 LAB — URINALYSIS, ROUTINE W REFLEX MICROSCOPIC
Bilirubin Urine: NEGATIVE
Glucose, UA: NEGATIVE mg/dL
Hgb urine dipstick: NEGATIVE
KETONES UR: NEGATIVE mg/dL
LEUKOCYTES UA: NEGATIVE
NITRITE: NEGATIVE
PROTEIN: NEGATIVE mg/dL
Specific Gravity, Urine: <1.005 — ABNORMAL LOW (ref 1.005–1.030)
pH: 7.5 (ref 5.0–8.0)

## 2016-08-27 LAB — CBC
HCT: 34 % — ABNORMAL LOW (ref 39.0–52.0)
Hemoglobin: 11.3 g/dL — ABNORMAL LOW (ref 13.0–17.0)
MCH: 33.2 pg (ref 26.0–34.0)
MCHC: 33.2 g/dL (ref 30.0–36.0)
MCV: 100 fL (ref 78.0–100.0)
PLATELETS: 140 10*3/uL — AB (ref 150–400)
RBC: 3.4 MIL/uL — AB (ref 4.22–5.81)
RDW: 14.3 % (ref 11.5–15.5)
WBC: 5.1 10*3/uL (ref 4.0–10.5)

## 2016-08-27 LAB — COMPREHENSIVE METABOLIC PANEL WITH GFR
ALT: 14 U/L — ABNORMAL LOW (ref 17–63)
AST: 23 U/L (ref 15–41)
Albumin: 4 g/dL (ref 3.5–5.0)
Alkaline Phosphatase: 71 U/L (ref 38–126)
Anion gap: 4 — ABNORMAL LOW (ref 5–15)
BUN: 22 mg/dL — ABNORMAL HIGH (ref 6–20)
CO2: 32 mmol/L (ref 22–32)
Calcium: 9.4 mg/dL (ref 8.9–10.3)
Chloride: 101 mmol/L (ref 101–111)
Creatinine, Ser: 0.9 mg/dL (ref 0.61–1.24)
GFR calc Af Amer: >60 mL/min
GFR calc non Af Amer: >60 mL/min
Glucose, Bld: 229 mg/dL — ABNORMAL HIGH (ref 65–99)
Potassium: 4.3 mmol/L (ref 3.5–5.1)
Sodium: 137 mmol/L (ref 135–145)
Total Bilirubin: 0.6 mg/dL (ref 0.3–1.2)
Total Protein: 7 g/dL (ref 6.5–8.1)

## 2016-08-27 LAB — D-DIMER, QUANTITATIVE: D-Dimer, Quant: 2.01 ug{FEU}/mL — ABNORMAL HIGH (ref 0.00–0.50)

## 2016-08-27 LAB — LIPASE, BLOOD: LIPASE: 40 U/L (ref 11–51)

## 2016-08-27 MED ORDER — IOPAMIDOL (ISOVUE-370) INJECTION 76%
INTRAVENOUS | Status: AC
  Administered 2016-08-27: 100 mL via INTRAVENOUS
  Filled 2016-08-27: qty 100

## 2016-08-27 MED ORDER — HEPARIN SOD (PORK) LOCK FLUSH 100 UNIT/ML IV SOLN
500.0000 [IU] | Freq: Once | INTRAVENOUS | Status: AC
  Administered 2016-08-27: 500 [IU]
  Filled 2016-08-27: qty 5

## 2016-08-27 MED ORDER — LIDOCAINE 5 % EX PTCH: 1.0000 | MEDICATED_PATCH | CUTANEOUS | 0 refills | Status: DC

## 2016-08-27 MED ORDER — LIDOCAINE 5 % EX PTCH
1.0000 | MEDICATED_PATCH | CUTANEOUS | Status: DC
  Administered 2016-08-27: 1 via TRANSDERMAL
  Filled 2016-08-27: qty 1

## 2016-08-27 NOTE — Discharge Instructions (Signed)
Please read and follow all provided instructions.  Your diagnoses today include:  1. Closed fracture of multiple ribs of right side, initial encounter     Tests performed today include: Vital signs. See below for your results today.   Medications prescribed:  Take as prescribed   Home care instructions:  Follow any educational materials contained in this packet.  Follow-up instructions: Please follow-up with your primary care provider for further evaluation of symptoms and treatment   Return instructions:  Please return to the Emergency Department if you do not get better, if you get worse, or new symptoms OR  - Fever (temperature greater than 101.28F)  - Bleeding that does not stop with holding pressure to the area    -Severe pain (please note that you may be more sore the day after your accident)  - Chest Pain  - Difficulty breathing  - Severe nausea or vomiting  - Inability to tolerate food and liquids  - Passing out  - Skin becoming red around your wounds  - Change in mental status (confusion or lethargy)  - New numbness or weakness    Please return if you have any other emergent concerns.  Additional Information:  Your vital signs today were: BP 121/62 (BP Location: Right Arm)    Pulse 61    Temp 98.1 F (36.7 C) (Oral)    Resp 18    Ht 5\' 8"  (1.727 m)    Wt 80.3 kg (177 lb)    SpO2 99%    BMI 26.91 kg/m  If your blood pressure (BP) was elevated above 135/85 this visit, please have this repeated by your doctor within one month. ---------------

## 2016-08-27 NOTE — ED Notes (Signed)
ED Provider at bedside. 

## 2016-08-27 NOTE — ED Provider Notes (Signed)
Patient with right-sided rib cage pain at mid axillary line. Pain is worse with deep inspiration or changing position. He denies abdominal pain. On exam he is alert and in no distress lungs clear to auscultation. Chest is tender, reproducing pain exactly at lower rib cage in the midaxillary line right side crepitance abdomen soft, nontender   Orlie Dakin, MD 08/27/16 2035

## 2016-08-27 NOTE — ED Notes (Signed)
Lab to add on D-Dimer 

## 2016-08-27 NOTE — ED Triage Notes (Signed)
Patient c/o intermittent right upper abdominal pain x 3 days. Today ws the worst. Patient denies any N/V/D. Last BM at 0800. Patient currently has a feeding tube and states he takes very little orally.

## 2016-08-27 NOTE — ED Notes (Signed)
Patient transported to CT 

## 2016-08-27 NOTE — ED Notes (Signed)
PA at bedside.

## 2016-08-27 NOTE — ED Provider Notes (Signed)
[facility] DEPT Provider Note   CSN: [id number] Arrival date & time: 08/27/16  1427     History   Chief Complaint Chief Complaint  Patient presents with  . Abdominal Pain    HPI ORI TREJOS is a 76 y.o. male.  HPI  76 y.o. male with a hx of Ankylosing Spondylitis, CAD, Crohn's Disease, DM, GERD, Hiatal Hernia, HTN, HLD, Oral Cancer, presents to the Emergency Department today due to intermittent RUQ abdominal pain x 3 days. Worse of the pain started today after golfing. Pt unsure of when it first started, but states it is intermittent and worse with movement. Hurts worse with putting shirt on and with deep breaths. No CP/SOB. Denies N/V/D. Last BM around 0800. Regular stools. Pt with feeding tube placed due to Oral Cancer and has been eating that way. No fevers. No cough/congestion. No headaches. No neck stiffness. No numbness/tingling. Pt does not working out with rowing machine x 3 days ago and feeling sensation then, but was not as bad. Pt states this feels similar to broken rib that he had on the left side. No other symptoms noted.   Past Medical History:  Diagnosis Date  . Ankylosing spondylitis ([place])   . Blood transfusion    hx of transfusion without reaction  . Bradycardia   . Cancer ([place]) 03/2016   oral  . Cholelithiasis   . Coronary artery disease    a. s/p CABG x 6 in 1997 (LIMA->LAD, VG->RI ->OM1->OM2, VG->PDA->PLV // b. 05/2011 Cath:  patent grafs, native prox rca and d2 dzs  ->med rx. // c. Myoview 1/18: not gated, large inferolateral scar, no ischemia; Intermediate Risk (IL scar old - on prior studies >> med rx)  . Crohn's colitis ([place]) 12/01/1997  . Diabetes mellitus   . Dysphagia   . Fatty liver   . GERD (gastroesophageal reflux disease)   . Heart block   . Hiatal hernia 12/03/2008  . History of radiation therapy 05/23/16- 07/09/16   Left Cheek/ 60 Gy in 30 fractions to gross disease, 63 Gy in 35 fractions to high risk nodal echelons, and 56 Gy in 35 fractions  to intermedicate risk nodal echelons.   . History of skin cancer   . Hx of echocardiogram    Echo 5/14:  Mild LVH, EF 55-60%, NL diast fxn, mild LAE   . Hyperlipidemia   . Hypertension   . Kidney stones   . Neck pain   . Pneumonia    hx of PNA  . 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 Foot drop, right 08/23/2016  . Anemia, chronic disease 07/13/2016  . Acquired pancytopenia ([place]) 06/14/2016  . Other constipation 06/07/2016  . 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 B12 deficiency anemia 08/03/2013  . Ulcerative colitis ([place]) 01/17/2010  . Occlusion and stenosis of carotid artery without mention of cerebral infarction 09/20/2009  . GERD 12/02/2008  . Type II diabetes mellitus with manifestations ([place]) 04/27/2008  . Hyperlipidemia with target LDL less than 70 12/04/2007  . Essential hypertension, benign 12/04/2007  . Ankylosing spondylitis ([place]) 12/04/2007  . Coronary atherosclerosis of native coronary artery 12/04/2007  . 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 Dysphagia 05/06/2007    Past Surgical History:  Procedure Laterality Date  . cancer removal  2014   removed from left outter leg  . COLONOSCOPY  2012  .  CORONARY ARTERY BYPASS GRAFT  04/19/1995   x7  . EXCISION ORAL TUMOR Left    left jaw and lymph node  . HERNIA REPAIR  1989  . IR FLUORO GUIDE PORT INSERTION RIGHT  05/21/2016  . IR GASTROSTOMY TUBE MOD SED  05/21/2016  . IR US GUIDE VASC ACCESS RIGHT  05/21/2016  . LEFT HEART CATHETERIZATION WITH CORONARY/GRAFT ANGIOGRAM N/A 05/24/2011   Procedure: LEFT HEART CATHETERIZATION WITH Beatrix Fetters;  Surgeon: Larey Dresser, MD;  Location: [facility] CATH LAB;  Service: Cardiovascular;  Laterality: N/A;  . MOHS SURGERY     X 2 off chin and nose  . TONSILLECTOMY AND ADENOIDECTOMY  1944  . TRIGGER FINGER RELEASE    .  UMBILICAL HERNIA REPAIR         Home Medications    Prior to Admission medications   Medication Sig Start Date End Date Taking? Authorizing Provider  aspirin EC 81 MG tablet Take 81 mg by mouth daily.    [provider]  atorvastatin (LIPITOR) 80 MG tablet Take 80 mg by mouth at bedtime.    [provider]  balsalazide (COLAZAL) 750 MG capsule Take 3 capsules (2,250 mg total) by mouth 3 (three) times daily. 06/20/16   Pyrtle, Lajuan Lines, MD  cyanocobalamin (,VITAMIN B-12,) 1000 MCG/ML injection INJECT 1ML ONCE MONTHLY. 05/22/16   Janith Lima, MD  furosemide (LASIX) 40 MG tablet Take 40 mg by mouth daily. 07/05/16   [provider]  isosorbide mononitrate (ISMO,MONOKET) 20 MG tablet Crush 2 tablets (40 mg total) by mouth 2 (two) times daily at 10 AM and 5 PM. 07/03/16   Sherren Mocha, MD  lidocaine-prilocaine (EMLA) cream Apply 1 application topically as needed.    [provider]  magic mouthwash w/lidocaine SOLN Take 10 mLs by mouth 4 (four) times daily as needed for mouth pain. 06/29/16   Hongalgi, Lenis Dickinson, MD  mercaptopurine (PURINETHOL) 50 MG tablet Take 1 tablet (50 mg total) by mouth daily. Give on an empty stomach 1 hour before or 2 hours after meals. Caution: Chemotherapy. 07/27/16   Pyrtle, Lajuan Lines, MD  metFORMIN (GLUCOPHAGE) 500 MG tablet Place 1 tablet (500 mg total) into feeding tube 2 (two) times daily with a meal. 06/29/16   Hongalgi, Lenis Dickinson, MD  nitroGLYCERIN (NITROSTAT) 0.4 MG SL tablet Place 0.4 mg under the tongue every 5 (five) minutes as needed for chest pain.    [provider]  Nutritional Supplements (FEEDING SUPPLEMENT, OSMOLITE 1.5 CAL,) LIQD Give 1 and 1/2 cans Osmolite 1.5 via PEG 4 times daily with 60 mL free water before and after bolus feedings. In addition, drink or flush PEG with 240 mL free water TID. Send formula and supplies. 06/19/16   [name], MD  Sennosides (SENEXON PO) Place 1 packet into feeding tube daily as needed  (constipation).    [provider]    Family History Family History  Problem Relation Age of Onset  . Heart disease Father   . Heart disease Mother   . Arthritis Mother   . Heart disease Unknown   . Crohn's disease Unknown   . Heart disease Sister   . Cancer Paternal Grandmother        colon  . Colon cancer Neg Hx     Social History Social History  Substance Use Topics  . Smoking status: Former Smoker    Quit date: 01/02/1983  . Smokeless tobacco: Former Systems developer    Types: Loss adjuster, chartered  Quit date: 01/01/2010  . Alcohol use Yes     Comment: tottie every night     Allergies   Patient has no known allergies.   Review of Systems Review of Systems ROS reviewed and all are negative for acute change except as noted in the HPI.  Physical Exam Updated Vital Signs BP 135/80 (BP Location: Right Arm)   Pulse 67   Temp 98.1 F (36.7 C) (Oral)   Resp 16   Ht 5\' 8"  (1.727 m)   Wt 80.3 kg (177 lb)   SpO2 98%   BMI 26.91 kg/m   Physical Exam  Constitutional: He is oriented to person, place, and time. Vital signs are normal. He appears well-developed and well-nourished. No distress.  HENT:  Head: Normocephalic and atraumatic.  Right Ear: Hearing, tympanic membrane, external ear and ear canal normal.  Left Ear: Hearing, tympanic membrane, external ear and ear canal normal.  Nose: Nose normal.  Mouth/Throat: Uvula is midline, oropharynx is clear and moist and mucous membranes are normal. No trismus in the jaw. No oropharyngeal exudate, posterior oropharyngeal erythema or tonsillar abscesses.  Eyes: Pupils are equal, round, and reactive to light. Conjunctivae and EOM are normal.  Neck: Normal range of motion. Neck supple. No tracheal deviation present.  Cardiovascular: Normal rate, regular rhythm, S1 normal, S2 normal, normal heart sounds, intact distal pulses and normal pulses.   Pulmonary/Chest: Effort normal and breath sounds normal. No respiratory distress. He has no decreased  breath sounds. He has no wheezes. He has no rhonchi. He has no rales.  TTP between rib 8-9. No palpable or visible deformities.   Abdominal: Normal appearance and bowel sounds are normal. There is no tenderness. There is no rigidity, no rebound, no guarding, no CVA tenderness, no tenderness at McBurney's point and negative Murphy's sign.  Abdomen soft. Non tender  Musculoskeletal: Normal range of motion.  Neurological: He is alert and oriented to person, place, and time.  Skin: Skin is warm and dry.  Psychiatric: He has a normal mood and affect. His speech is normal and behavior is normal. Thought content normal.     ED Treatments / Results  Labs (all labs ordered are listed, but only abnormal results are displayed) Labs Reviewed  COMPREHENSIVE METABOLIC PANEL - Abnormal; Notable for the following:       Result Value   Glucose, Bld 229 (*)    BUN 22 (*)    ALT 14 (*)    Anion gap 4 (*)    All other components within normal limits  CBC - Abnormal; Notable for the following:    RBC 3.40 (*)    Hemoglobin 11.3 (*)    HCT 34.0 (*)    Platelets 140 (*)    All other components within normal limits  URINALYSIS, ROUTINE W REFLEX MICROSCOPIC - Abnormal; Notable for the following:    Specific Gravity, Urine <1.005 (*)    All other components within normal limits  D-DIMER, QUANTITATIVE (NOT AT [facility]) - Abnormal; Notable for the following:    D-Dimer, Quant 2.01 (*)    All other components within normal limits  LIPASE, BLOOD    EKG  EKG Interpretation None       Radiology Dg Ribs Unilateral W/chest Right  Result Date: 08/27/2016 CLINICAL DATA:  Right rib pain EXAM: RIGHT RIBS AND CHEST - 3+ VIEW COMPARISON:  06/26/2016 FINDINGS: Single-view chest demonstrates post sternotomy changes. Right-sided central venous port tip faintly visualized over the cavoatrial region. No acute infiltrate  or effusion. Normal heart size. No pneumothorax. Degenerative change of the right AC joint and  shoulder. Right rib series demonstrates acute minimally displaced right eighth lateral rib fracture and questionable fracture of the ninth rib. Gastrostomy tube. IMPRESSION: 1. Negative for pneumothorax or pleural effusion 2. Acute right eighth rib fracture. Questionable fracture deformity of the right ninth rib. Electronically Signed   By: Donavan Foil M.D.   On: 08/27/2016 20:38   Ct Angio Chest Pe W And/or Wo Contrast  Result Date: 08/27/2016 CLINICAL DATA:  Right-sided chest wall pain, pleuritic. The pain is reproducible on palpation. EXAM: CT ANGIOGRAPHY CHEST WITH CONTRAST TECHNIQUE: Multidetector CT imaging of the chest was performed using the standard protocol during bolus administration of intravenous contrast. Multiplanar CT image reconstructions and MIPs were obtained to evaluate the vascular anatomy. CONTRAST:  100 mL Isovue 370 intravenous COMPARISON:  Radiographs 08/27/2016, CT, 05/11/2016 FINDINGS: Cardiovascular: Satisfactory opacification of the pulmonary arteries to the segmental level. No evidence of pulmonary embolism. Normal heart size. No pericardial effusion. Aortic and coronary atherosclerotic calcifications. Mediastinum/Nodes: No enlarged mediastinal, hilar, or axillary lymph nodes. Unchanged 2.2 cm left lobe thyroid nodule. Lungs/Pleura: Unchanged noncalcified 4 mm nodule of the left lower lobe. No consolidation. No effusions. Airways are patent and normal in caliber. Upper Abdomen: No acute findings. Musculoskeletal: No significant skeletal lesion. No evidence of acute fracture. Ankylosing spondylitis again noted. Review of the MIP images confirms the above findings. IMPRESSION: 1. Negative for acute pulmonary embolism. 2. Unchanged noncalcified left lower lobe pulmonary nodule. No follow-up needed if patient is low-risk. Non-contrast chest CT can be considered in 12 months if patient is high-risk. This recommendation follows the consensus statement: Guidelines for Management of  Incidental Pulmonary Nodules Detected on CT Images: From the Fleischner Society 2017; Radiology 2017; 284:228-243. 3. Unchanged left lobe thyroid nodule. Recommend thyroid ultrasound for characterization. 4. Ankylosing spondylitis. Electronically Signed   By: Andreas Newport M.D.   On: 08/27/2016 22:59    Procedures Procedures (including critical care time)  Medications Ordered in ED Medications  lidocaine (LIDODERM) 5 % 1 patch (1 patch Transdermal Patch Applied 08/27/16 2007)  iopamidol (ISOVUE-370) 76 % injection (100 mLs Intravenous Contrast Given 08/27/16 2127)     Initial Impression / Assessment and Plan / ED Course  I have reviewed the triage vital signs and the nursing notes.  Pertinent labs & imaging results that were available during my care of the patient were reviewed by me and considered in my medical decision making (see chart for details).  Final Clinical Impressions(s) / ED Diagnoses  {I have reviewed and evaluated the relevant laboratory values. {I have reviewed and evaluated the relevant imaging studies.  {I have reviewed the relevant previous healthcare records.  {I obtained HPI from historian. {Patient discussed with supervising physician.  ED Course:  Assessment: Pt is a 76 y.o. male with a hx of Ankylosing Spondylitis, CAD, Crohn's Disease, DM, GERD, Hiatal Hernia, HTN, HLD, Oral Cancer, presents to the Emergency Department today due to intermittent RUQ abdominal pain x 3 days. Worse of the pain started today after golfing. Pt unsure of when it first started, but states it is intermittent and worse with movement. Hurts worse with putting shirt on and with deep breaths. No CP/SOB. Denies N/V/D. Last BM around 0800. Regular stools. Pt with feeding tube placed due to Oral Cancer and has been eating that way. No fevers. No cough/congestion. No headaches. No neck stiffness. No numbness/tingling. Pt does not working out with rowing machine  x 3 days ago and feeling sensation  then, but was not as bad. Pt states this feels similar to broken rib that he had on the left side. On exam, pt in NAD. Nontoxic/nonseptic appearing. VSS. Afebrile. Lungs CTA. Heart RRR. Abdomen nontender soft. TTP along ribs 11-12. No palpable or visible deformities. Labs unremarkable. LFTs unremarkable. DG Rib with 8th and 9th rib fractures. Given lidoderm patch in ED. Seen by attending physician. Will add on D dimer due to worsening with with shortness of breath and hx cancer. D dimer was elevated at 2.01. Xray shows 8th and 9th rib fractures. CT Angio ordered, which showed no PE. NO malignancy. Plan is to DC home with follow up to PCP. Given Chiropodist. At time of discharge, Patient is in no acute distress. Vital Signs are stable. Patient is able to ambulate. Patient able to tolerate PO.   Disposition/Plan:  DC Home Additional Verbal discharge instructions given and discussed with patient.  Pt Instructed to f/u with PCP in the next week for evaluation and treatment of symptoms. Return precautions given Pt acknowledges and agrees with plan  Supervising Physician Orlie Dakin, MD  Final diagnoses:  Closed fracture of multiple ribs of right side, initial encounter    New Prescriptions New Prescriptions   No medications on file     Shary Decamp, Hershal Coria 08/27/16 2324    Orlie Dakin, MD 08/28/16 0000

## 2016-08-28 DIAGNOSIS — C069 Malignant neoplasm of mouth, unspecified: Secondary | ICD-10-CM | POA: Diagnosis not present

## 2016-08-28 DIAGNOSIS — I69991 Dysphagia following unspecified cerebrovascular disease: Secondary | ICD-10-CM | POA: Diagnosis not present

## 2016-08-30 DIAGNOSIS — D485 Neoplasm of uncertain behavior of skin: Secondary | ICD-10-CM | POA: Diagnosis not present

## 2016-08-30 DIAGNOSIS — Z85828 Personal history of other malignant neoplasm of skin: Secondary | ICD-10-CM | POA: Diagnosis not present

## 2016-08-30 DIAGNOSIS — L821 Other seborrheic keratosis: Secondary | ICD-10-CM | POA: Diagnosis not present

## 2016-08-30 DIAGNOSIS — L570 Actinic keratosis: Secondary | ICD-10-CM | POA: Diagnosis not present

## 2016-08-30 DIAGNOSIS — D0439 Carcinoma in situ of skin of other parts of face: Secondary | ICD-10-CM | POA: Diagnosis not present

## 2016-09-02 ENCOUNTER — Other Ambulatory Visit: Payer: Self-pay | Admitting: Cardiovascular Disease

## 2016-09-04 ENCOUNTER — Ambulatory Visit (INDEPENDENT_AMBULATORY_CARE_PROVIDER_SITE_OTHER): Payer: PPO

## 2016-09-04 ENCOUNTER — Encounter (HOSPITAL_COMMUNITY): Payer: Self-pay | Admitting: Emergency Medicine

## 2016-09-04 ENCOUNTER — Ambulatory Visit (HOSPITAL_COMMUNITY)
Admission: EM | Admit: 2016-09-04 | Discharge: 2016-09-04 | Disposition: A | Discharge status: Home / Self Care | Payer: PPO | Attending: Family Medicine | Admitting: Family Medicine

## 2016-09-04 DIAGNOSIS — W19XXXA Unspecified fall, initial encounter: Secondary | ICD-10-CM

## 2016-09-04 DIAGNOSIS — S2242XA Multiple fractures of ribs, left side, initial encounter for closed fracture: Secondary | ICD-10-CM

## 2016-09-04 DIAGNOSIS — S20212A Contusion of left front wall of thorax, initial encounter: Secondary | ICD-10-CM

## 2016-09-04 MED ORDER — HYDROCODONE-ACETAMINOPHEN 5-325 MG PO TABS: 1.0000 | ORAL_TABLET | Freq: Four times a day (QID) | ORAL | 0 refills | Status: DC | PRN

## 2016-09-04 NOTE — ED Provider Notes (Signed)
[place]   [id number] 09/04/16 Arrival Time: 3267  ASSESSMENT & PLAN:  1. Rib contusion, left, initial encounter   2. Closed fracture of multiple ribs of left side, initial encounter   No pneumothorax.  Meds ordered this encounter  Medications  . HYDROcodone-acetaminophen (NORCO/VICODIN) 5-325 MG tablet    Sig: Take 1 tablet by mouth every 6 (six) hours as needed for moderate pain or severe pain.    Dispense:  15 tablet    Refill:  0   Ibuprofen as tolerated and hydrocodone as needed. Medication sedation precautions.  He has planned f/u with PCP. May f/u here as needed.  [place] Controlled Substances Registry consulted for this patient. I feel the risk/benefit ratio today is favorable for proceeding with this prescription for a controlled substance.  Reviewed expectations re: course of current medical issues. Questions answered. Outlined signs and symptoms indicating need for more acute intervention. Patient verbalized understanding. After Visit Summary given.   SUBJECTIVE:  Andres Smith is a 76 y.o. male who reports falling in his bathroom this morning hitting L ribs on toilet. Immediate pain. Reports breaking two ribs last week (non-traumatic). No respiratory difficulty/SOB. Pain exacerbated with inspiration. No analgesics taken. Ambulatory without assistance. Noticed small abrasion above R eye. No bleeding. Questions if he hit his head. No LOC reported. No amnesia. Vision normal. No HA or n/v.  ROS: As per HPI.   OBJECTIVE:  Vitals:   09/04/16 1534 09/04/16 1535  BP:  118/78  Pulse:  87  Resp:  16  Temp:  97.6 F (36.4 C)  TempSrc:  Oral  SpO2:  99%  Weight: 170 lb (77.1 kg)   Height: 5\' 8"  (1.727 m)     General appearance: alert; no distress Eyes: PERRLA; EOMI; conjunctiva normal HENT: normocephalic with <1IW abrasion above R eyebrow; no bleeding Neck: supple Lungs: clear to auscultation bilaterally; normal respiratory effort Heart: regular rate and  rhythm Chest Wall: tender over L lateral ribs; no gross deformity Abdomen: soft, non-tender Extremities: no cyanosis or edema; symmetrical with no gross deformities Psychological: alert and cooperative; normal mood and affect  Imaging: Dg Ribs Unilateral W/chest Left  Result Date: 09/04/2016 CLINICAL DATA:  Initial encounter for Patient states that he tripped and fell this morning onto his left side, pain in ribs. Hx of cardiac bypass, hernia repair. Former smoker- quit 25-30 years ago. EXAM: LEFT RIBS AND CHEST - 3+ VIEW COMPARISON:  08/27/2016 FINDINGS: Frontal view chest and two views of left-sided ribs. Frontal view of the chest demonstrates a right-sided Port-A-Cath which terminates at the low SVC. Prior median sternotomy. No pleural effusion or pneumothorax. Clear lungs. Left-sided rib films demonstrate minimally displaced posterior eighth left rib fracture. posterolateral left seventh nondisplaced fracture. Ankylosing spondylitis. IMPRESSION: Left-sided rib fractures, without pneumothorax or pleural fluid. Electronically Signed   By: Abigail Miyamoto M.D.   On: 09/04/2016 16:03    No Known Allergies  Past Medical History:  Diagnosis Date  . Ankylosing spondylitis ([place])   . Blood transfusion    hx of transfusion without reaction  . Bradycardia   . Cancer ([place]) 03/2016   oral  . Cholelithiasis   . Coronary artery disease    a. s/p CABG x 6 in 1997 (LIMA->LAD, VG->RI ->OM1->OM2, VG->PDA->PLV // b. 05/2011 Cath:  patent grafs, native prox rca and d2 dzs  ->med rx. // c. Myoview 1/18: not gated, large inferolateral scar, no ischemia; Intermediate Risk (IL scar old - on prior studies >> med rx)  .  Crohn's colitis ([place]) 12/01/1997  . Diabetes mellitus   . Dysphagia   . Fatty liver   . GERD (gastroesophageal reflux disease)   . Heart block   . Hiatal hernia 12/03/2008  . History of radiation therapy 05/23/16- 07/09/16   Left Cheek/ 60 Gy in 30 fractions to gross disease, 63 Gy in 35 fractions  to high risk nodal echelons, and 56 Gy in 35 fractions to intermedicate risk nodal echelons.   . History of skin cancer   . Hx of echocardiogram    Echo 5/14:  Mild LVH, EF 55-60%, NL diast fxn, mild LAE   . Hyperlipidemia   . Hypertension   . Kidney stones   . Neck pain   . Pneumonia    hx of PNA  . 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 Marital status: Married    Spouse name: N/A  . Number of children: 2  . Years of education: N/A   Occupational History  . semi retired Sports coach Employed   Social History Main Topics  . Smoking status: Former Smoker    Quit date: 01/02/1983  . Smokeless tobacco: Former Systems developer    Types: Chew    Quit date: 01/01/2010  . Alcohol use Yes     Comment: tottie every night  . Drug use: No  . Sexual activity: Not Currently   Other Topics Concern  . Not on file   Social History Narrative   HSG, [place] - Civil engineering/education in industrial, [place] from [place]. Married '64. 1 son '74, 1 dtr - '69; 1 grandchild, 1 on the way. 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 Lives with wife. ACP -            Family History  Problem Relation Age of Onset  . Heart disease Father   . Heart disease Mother   . Arthritis Mother   . Heart disease Unknown   . Crohn's disease Unknown   . Heart disease Sister   . Cancer Paternal Grandmother        colon  . Colon cancer Neg Hx    Past Surgical History:  Procedure Laterality Date  . cancer removal  2014   removed from left outter leg  . COLONOSCOPY  2012  . CORONARY ARTERY BYPASS GRAFT  04/19/1995   x7  . EXCISION ORAL TUMOR Left    left jaw and lymph node  . HERNIA REPAIR  1989  . IR FLUORO GUIDE PORT INSERTION  RIGHT  05/21/2016  . IR GASTROSTOMY TUBE MOD SED  05/21/2016  . IR US GUIDE VASC ACCESS RIGHT  05/21/2016  . LEFT HEART CATHETERIZATION WITH CORONARY/GRAFT ANGIOGRAM N/A 05/24/2011   Procedure: LEFT HEART CATHETERIZATION WITH Beatrix Fetters;  Surgeon: Larey Dresser, MD;  Location: [facility] CATH LAB;  Service: Cardiovascular;  Laterality: N/A;  . MOHS SURGERY     X 2 off chin and nose  . TONSILLECTOMY AND ADENOIDECTOMY  1944  . TRIGGER FINGER RELEASE    . UMBILICAL HERNIA REPAIR       Vanessa Kick, MD 09/05/16 [phone]

## 2016-09-04 NOTE — ED Triage Notes (Signed)
PT tripped in the bathroom this morning and hit his head, left elbow, and left ribs. PT believes he may have lost consciousness. PT reports he broke ribs on right side last week.

## 2016-09-07 ENCOUNTER — Telehealth: Payer: Self-pay

## 2016-09-07 MED ORDER — METFORMIN HCL 500 MG PO TABS: 500.0000 mg | ORAL_TABLET | Freq: Two times a day (BID) | ORAL | 1 refills | Status: DC

## 2016-09-07 NOTE — Telephone Encounter (Signed)
Pt came in and requested refills. Reviewed chart and LOV 08/28/2016. Follow up need in Feb of 2019.   Erx sent and pt informed of same.

## 2016-09-12 ENCOUNTER — Encounter: Payer: Self-pay | Admitting: *Deleted

## 2016-09-12 ENCOUNTER — Encounter: Payer: Self-pay | Admitting: Hematology and Oncology

## 2016-09-12 ENCOUNTER — Telehealth: Payer: Self-pay

## 2016-09-12 ENCOUNTER — Ambulatory Visit (HOSPITAL_BASED_OUTPATIENT_CLINIC_OR_DEPARTMENT_OTHER): Payer: PPO | Admitting: Hematology and Oncology

## 2016-09-12 ENCOUNTER — Ambulatory Visit: Payer: PPO | Attending: Radiation Oncology

## 2016-09-12 DIAGNOSIS — C060 Malignant neoplasm of cheek mucosa: Secondary | ICD-10-CM

## 2016-09-12 DIAGNOSIS — R1310 Dysphagia, unspecified: Secondary | ICD-10-CM | POA: Diagnosis not present

## 2016-09-12 DIAGNOSIS — R432 Parageusia: Secondary | ICD-10-CM

## 2016-09-12 NOTE — Patient Instructions (Addendum)
  Please complete exercises at LEAST 12 reps at a time, twice to 3x/day if you'd rather, than doing 20 reps twice/day.

## 2016-09-12 NOTE — Telephone Encounter (Signed)
Printed patient avs and calender for October. Per 9/12 los

## 2016-09-12 NOTE — Assessment & Plan Note (Signed)
Dysphagia is improving He has appointment to see speech and language therapist today I encouraged him to increase oral intake as tolerated with the goal to wean him off feeding tube by next month.

## 2016-09-12 NOTE — Progress Notes (Signed)
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 Tumor measures 0.9 cm in greatest dimension. Tumor extends to the inked deep tissue edge. Depth of invasion: 4 mm (in this material) See note.  Note: an immunostains for P16 was performed at an outside hospital and provided for [place] to review; p16 immunostain is negative. Per outside report, In situ hybridization for high-risk HPV types showed no evidence of transcriptionally active high-risk HPV types.      03/21/2016 Initial Diagnosis    Salient findings:  -EAC's clear -Lesion within left buccal mucosa (pictured below) with about 60m depth. Width is about 2/3 of the left buccal mucosal surface area by palpation. Extends to the oral commissure.  -Tongue soft and mobile without mucosal lesions -Neck soft with no palpable neck masses -Right handed -Right hand is pink and warm with Allen's test negative (normal)  -Left forearm very sun damaged with multiple treated areas from precancerous or cancerous areas. Right forearm not as dramatic, presumably from sun exposure while driving      [id number]    RESECTION OF ORAL BUCCAL MUCOSA SELECTIVE NECK DISSECTION FREE FLAP RADIAL FOREARM PR SPLIT GInkster<100 SQCM [15100] (SPLIT THICKNESS SKIN GRAFT LEG         04/13/2016 Pathology Results    A.LEFT ORAL COMMISSURE INFERIOR, EXCISION: No malignancy identified.  B.LEFT ORAL COMMISSURE SUPERIOR, EXCISION: No malignancy identified.  C.LEFT DEEP ORAL COMMISSURE, EXCISION: No malignancy identified.  D.RIGHT POSTERIOR BUCCAL INFERIOR, EXCISION: No malignancy identified.  E.RIGHT POSTERIOR BUCCAL SUPERIOR, EXCISION: No malignancy identified.  F.ORAL CAVITY, LEFT BUCCAL, EXCISION:  Invasive squamous cell carcinoma, moderately differentiated,  keratinizing. Tumor size:2.2 cm. Tumor depth of invasion:0.8 cm. Perineural invasion identified. Margins negative for malignancy. Pathologic stage:pT2 pN2a. See tumor protocol summary below.  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 Tumor deposit size:1.1 cm. Extranodal extension present. Benign salivary gland tissue.  SURGICAL PATHOLOGY CANCER CASE SUMMARY (AJCC 8TH EDITION) LIP AND ORAL CAVITY CAP Protocol posting date: June, 2017 Version: LipOralCavity 4.0.0.0  PROCEDURE: Wide resection of buccal mucosa TUMOR SITE:Oral:Buccal mucosa TUMOR LATERALITY: Left TUMOR FOCALITY: Unifocal TUMOR SIZE:Greatest dimension: 2.2 cm TUMOR DEPTH OF INVASION (DOI):8 mm HISTOLOGIC TYPE:Squamous cell carcinoma, conventional HISTOLOGIC GRADE:G2: Moderately differentiated SPECIMEN MARGINS:Uninvolved by invasive carcinoma  DISTANCE FROM CLOSEST MARGIN:1 mm  SPECIFY MARGIN(S):Deep LYMPHOVASCULAR INVASION:Present PERINEURAL INVASION: Present REGIONAL LYMPH NODES: NUMBER OF LYMPH NODES INVOLVED: 1 NUMBER OF LYMPH NODES EXAMINED: 4 LATERALITY OF LYMPH NODES INVOLVED: Ipsilateral SIZE OF LARGEST METASTATIC DEPOSIT:1.1 cm EXTRANODAL EXTENSION:Present PATHOLOGIC STAGE CLASSIFICATION (pTNM, AJCC 8th  Ed): pT2 pN2a pT2: umor >2 cm but <=4 cm and <=10 mm DOI pN2a:Metastasis in a single ipsilateral lymph node, 3 cm or  smaller in greatest dimension and ENE(+) BIOMARKERS:performed on prior outside biopsy ([facility] case [id number] outside case [id number] P16 (Immunohistochemistry):Negative HPV (high-risk types by in situ hybridization):Reported as negative      05/11/2016 Imaging    1.  4 mm left lower lobe pulmonary nodule. Attention on follow-up imaging recommended as metastatic disease not excluded. 2. 2.8 cm left thyroid nodule. Thyroid ultrasound recommended to further evaluate. 3. Bilateral renal cysts. 4. Coronary artery and thoracoabdominal aortic atherosclerosis. 5. Ankylosing spondylitis.        05/11/2016 Imaging    CT neck: Surgical clips in the region of the left buccal mucosa, presumably at the previous primary site. Mild scarring in that region measuring about 8 mm. Cannot assess for residual or recurrent disease in that location, but this may simply be scarring. Previous left submandibular resection and left neck node dissection. No abnormal nodes presently. Multinodular goiter. Ankylosing spondylitis.  C1-2 remains a mobile segment.      05/21/2016 Procedure    1. Successful placement of a right internal jugular approach power injectable Port-A-Cath. The Port a catheter is ready for immediate use. 2. Successful fluoroscopic insertion of a 20-French pull-through gastrostomy tube. The gastrostomy may be used immediately for medication administration and may be utilized in 24 hrs for the initiation of feeds.      05/23/2016 - 07/09/2016 Radiation Therapy    He received radiation treatment       05/25/2016 - 06/15/2016 Chemotherapy    He received weekly reduced dose cisplatin       06/21/2016 Adverse Reaction    Cycle 5 is delayed due to pancytopenia      06/26/2016 - 06/29/2016 Hospital Admission    He was admitted for management of neutropenic  fever      08/27/2016 Imaging    1. Negative for pneumothorax or pleural effusion 2. Acute right eighth rib fracture. Questionable fracture deformity of the right ninth rib.      09/04/2016 Imaging    Left-sided rib fractures, without pneumothorax or pleural fluid       INTERVAL HISTORY: Please see below for problem oriented charting. He returns for further follow-up He complain of altered taste sensation, dry mouth and hence not able to eat He denies swallowing difficulties He has gained almost 7 pounds of weight He had recurrent emergency department visit due to falls and rib contusions He is taking minimum pain medicine for this No new lymphadenopathy in the neck  REVIEW OF SYSTEMS:   Constitutional: Denies fevers, chills or abnormal weight loss Eyes: Denies blurriness of vision Ears, nose, mouth, throat, and face: Denies mucositis or sore throat Respiratory: Denies cough, dyspnea or wheezes Cardiovascular: Denies palpitation, chest discomfort or lower extremity swelling Gastrointestinal:  Denies nausea, heartburn or change in bowel habits Skin: Denies abnormal skin rashes Lymphatics: Denies new lymphadenopathy or easy bruising Neurological:Denies numbness, tingling or new weaknesses Behavioral/Psych: Mood is stable, no new changes  All other systems were reviewed with the patient and are negative.  I have reviewed the past medical history, past surgical history, social history and family history with the patient and they are unchanged from previous note.  ALLERGIES:  has No Known Allergies.  MEDICATIONS:  Current Outpatient Prescriptions  Medication Sig Dispense Refill  . aspirin EC 81 MG tablet Take 81 mg by mouth daily.    Marland Kitchen atorvastatin (LIPITOR) 80 MG tablet Take 80 mg by mouth at bedtime.    . balsalazide (COLAZAL) 750 MG capsule Take 3 capsules (2,250 mg total) by mouth 3 (three) times daily. 270 capsule 3  . cyanocobalamin (,VITAMIN B-12,) 1000 MCG/ML injection  INJECT 1ML ONCE MONTHLY. 3 mL 1  . HYDROcodone-acetaminophen (NORCO/VICODIN) 5-325 MG tablet Take 1 tablet by mouth every 6 (  six) hours as needed for moderate pain or severe pain. 15 tablet 0  . isosorbide mononitrate (ISMO,MONOKET) 20 MG tablet Crush 2 tablets (40 mg total) by mouth 2 (two) times daily at 10 AM and 5 PM. 120 tablet 11  . magic mouthwash w/lidocaine SOLN Take 10 mLs by mouth 4 (four) times daily as needed for mouth pain. 240 mL 0  . mercaptopurine (PURINETHOL) 50 MG tablet Take 1 tablet (50 mg total) by mouth daily. Give on an empty stomach 1 hour before or 2 hours after meals. Caution: Chemotherapy. 30 tablet 1  . metFORMIN (GLUCOPHAGE) 500 MG tablet Place 1 tablet (500 mg total) into feeding tube 2 (two) times daily with a meal. 180 tablet 1  . nitroGLYCERIN (NITROSTAT) 0.4 MG SL tablet Place 1 tablet (0.4 mg total) under the tongue every 5 (five) minutes as needed for chest pain. Max 3 doses. In no response call 911. 25 tablet 5  . Nutritional Supplements (FEEDING SUPPLEMENT, OSMOLITE 1.5 CAL,) LIQD Give 1 and 1/2 cans Osmolite 1.5 via PEG 4 times daily with 60 mL free water before and after bolus feedings. In addition, drink or flush PEG with 240 mL free water TID. Send formula and supplies. 6 Bottle 0  . polyethylene glycol (MIRALAX / GLYCOLAX) packet Take 17 g by mouth daily as needed for moderate constipation.    . SPS 15 GM/60ML suspension Place 15 g into feeding tube daily as needed.      No current facility-administered medications for this visit.     PHYSICAL EXAMINATION: ECOG PERFORMANCE STATUS: 0 - Asymptomatic  Vitals:   09/12/16 1008  BP: (!) 127/57  Pulse: 69  Resp: 17  Temp: (!) 97.5 F (36.4 C)  SpO2: 98%   Filed Weights   09/12/16 1008  Weight: 176 lb 12.8 oz (80.2 kg)    GENERAL:alert, no distress and comfortable SKIN: skin color, texture, turgor are normal, no rashes or significant lesions EYES: normal, Conjunctiva are pink and non-injected,  sclera clear OROPHARYNX:no exudate, no erythema and lips, buccal mucosa, and tongue normal  NECK: supple, thyroid normal size, non-tender, without nodularity LYMPH:  no palpable lymphadenopathy in the cervical, axillary or inguinal LUNGS: clear to auscultation and percussion with normal breathing effort HEART: regular rate & rhythm and no murmurs and no lower extremity edema ABDOMEN:abdomen soft, non-tender and normal bowel sounds Musculoskeletal:no cyanosis of digits and no clubbing  NEURO: alert & oriented x 3 with fluent speech, no focal motor/sensory deficits  LABORATORY DATA:  I have reviewed the data as listed    Component Value Date/Time   NA 137 08/27/2016 1614   NA 141 07/24/2016 1347   K 4.3 08/27/2016 1614   K 5.3 (H) 07/24/2016 1347   CL 101 08/27/2016 1614   CO2 32 08/27/2016 1614   CO2 30 (H) 07/24/2016 1347   GLUCOSE 229 (H) 08/27/2016 1614   GLUCOSE 173 (H) 07/24/2016 1347   GLUCOSE 98 12/12/2005 0729   BUN 22 (H) 08/27/2016 1614   BUN 23.0 07/24/2016 1347   CREATININE 0.90 08/27/2016 1614   CREATININE 1.0 07/24/2016 1347   CALCIUM 9.4 08/27/2016 1614   CALCIUM 9.5 07/24/2016 1347   PROT 7.0 08/27/2016 1614   PROT 6.6 07/24/2016 1347   ALBUMIN 4.0 08/27/2016 1614   ALBUMIN 3.5 07/24/2016 1347   AST 23 08/27/2016 1614   AST 17 07/24/2016 1347   ALT 14 (L) 08/27/2016 1614   ALT 7 07/24/2016 1347   ALKPHOS 71 08/27/2016 1614  ALKPHOS 68 07/24/2016 1347   BILITOT 0.6 08/27/2016 1614   BILITOT 0.48 07/24/2016 1347   GFRNONAA >60 08/27/2016 1614   GFRAA >60 08/27/2016 1614    No results found for: SPEP, UPEP  Lab Results  Component Value Date   WBC 5.1 08/27/2016   NEUTROABS 2.0 08/13/2016   HGB 11.3 (L) 08/27/2016   HCT 34.0 (L) 08/27/2016   MCV 100.0 08/27/2016   PLT 140 (L) 08/27/2016      Chemistry      Component Value Date/Time   NA 137 08/27/2016 1614   NA 141 07/24/2016 1347   K 4.3 08/27/2016 1614   K 5.3 (H) 07/24/2016 1347   CL 101  08/27/2016 1614   CO2 32 08/27/2016 1614   CO2 30 (H) 07/24/2016 1347   BUN 22 (H) 08/27/2016 1614   BUN 23.0 07/24/2016 1347   CREATININE 0.90 08/27/2016 1614   CREATININE 1.0 07/24/2016 1347      Component Value Date/Time   CALCIUM 9.4 08/27/2016 1614   CALCIUM 9.5 07/24/2016 1347   ALKPHOS 71 08/27/2016 1614   ALKPHOS 68 07/24/2016 1347   AST 23 08/27/2016 1614   AST 17 07/24/2016 1347   ALT 14 (L) 08/27/2016 1614   ALT 7 07/24/2016 1347   BILITOT 0.6 08/27/2016 1614   BILITOT 0.48 07/24/2016 1347       RADIOGRAPHIC STUDIES: I have personally reviewed the radiological images as listed and agreed with the findings in the report. Dg Ribs Unilateral W/chest Left  Result Date: 09/04/2016 CLINICAL DATA:  Initial encounter for Patient states that he tripped and fell this morning onto his left side, pain in ribs. Hx of cardiac bypass, hernia repair. Former smoker- quit 25-30 years ago. EXAM: LEFT RIBS AND CHEST - 3+ VIEW COMPARISON:  08/27/2016 FINDINGS: Frontal view chest and two views of left-sided ribs. Frontal view of the chest demonstrates a right-sided Port-A-Cath which terminates at the low SVC. Prior median sternotomy. No pleural effusion or pneumothorax. Clear lungs. Left-sided rib films demonstrate minimally displaced posterior eighth left rib fracture. posterolateral left seventh nondisplaced fracture. Ankylosing spondylitis. IMPRESSION: Left-sided rib fractures, without pneumothorax or pleural fluid. Electronically Signed   By: Abigail Miyamoto M.D.   On: 09/04/2016 16:03   Dg Ribs Unilateral W/chest Right  Result Date: 08/27/2016 CLINICAL DATA:  Right rib pain EXAM: RIGHT RIBS AND CHEST - 3+ VIEW COMPARISON:  06/26/2016 FINDINGS: Single-view chest demonstrates post sternotomy changes. Right-sided central venous port tip faintly visualized over the cavoatrial region. No acute infiltrate or effusion. Normal heart size. No pneumothorax. Degenerative change of the right AC joint and  shoulder. Right rib series demonstrates acute minimally displaced right eighth lateral rib fracture and questionable fracture of the ninth rib. Gastrostomy tube. IMPRESSION: 1. Negative for pneumothorax or pleural effusion 2. Acute right eighth rib fracture. Questionable fracture deformity of the right ninth rib. Electronically Signed   By: Donavan Foil M.D.   On: 08/27/2016 20:38   Ct Angio Chest Pe W And/or Wo Contrast  Result Date: 08/27/2016 CLINICAL DATA:  Right-sided chest wall pain, pleuritic. The pain is reproducible on palpation. EXAM: CT ANGIOGRAPHY CHEST WITH CONTRAST TECHNIQUE: Multidetector CT imaging of the chest was performed using the standard protocol during bolus administration of intravenous contrast. Multiplanar CT image reconstructions and MIPs were obtained to evaluate the vascular anatomy. CONTRAST:  100 mL Isovue 370 intravenous COMPARISON:  Radiographs 08/27/2016, CT, 05/11/2016 FINDINGS: Cardiovascular: Satisfactory opacification of the pulmonary arteries to the segmental level. No  evidence of pulmonary embolism. Normal heart size. No pericardial effusion. Aortic and coronary atherosclerotic calcifications. Mediastinum/Nodes: No enlarged mediastinal, hilar, or axillary lymph nodes. Unchanged 2.2 cm left lobe thyroid nodule. Lungs/Pleura: Unchanged noncalcified 4 mm nodule of the left lower lobe. No consolidation. No effusions. Airways are patent and normal in caliber. Upper Abdomen: No acute findings. Musculoskeletal: No significant skeletal lesion. No evidence of acute fracture. Ankylosing spondylitis again noted. Review of the MIP images confirms the above findings. IMPRESSION: 1. Negative for acute pulmonary embolism. 2. Unchanged noncalcified left lower lobe pulmonary nodule. No follow-up needed if patient is low-risk. Non-contrast chest CT can be considered in 12 months if patient is high-risk. This recommendation follows the consensus statement: Guidelines for Management of  Incidental Pulmonary Nodules Detected on CT Images: From the Fleischner Society 2017; Radiology 2017; 284:228-243. 3. Unchanged left lobe thyroid nodule. Recommend thyroid ultrasound for characterization. 4. Ankylosing spondylitis. Electronically Signed   By: Andreas Newport M.D.   On: 08/27/2016 22:59    ASSESSMENT & PLAN:  Buccal mucosa squamous cell carcinoma ([facility]) He has completed all treatment and appears to be recovering well from side effects of treatment I will space out his appointment and will continue to provide supportive care   Dysphagia Dysphagia is improving He has appointment to see speech and language therapist today I encouraged him to increase oral intake as tolerated with the goal to wean him off feeding tube by next month.  Dysgeusia He has dysgeusia due to altered taste sensation and persistent dry mouth I encouraged him to increase oral intake as tolerated   No orders of the defined types were placed in this encounter.  All questions were answered. The patient knows to call the clinic with any problems, questions or concerns. No barriers to learning was detected. I spent 15 minutes counseling the patient face to face. The total time spent in the appointment was 20 minutes and more than 50% was on counseling and review of test results     [name], MD 09/12/2016 10:32 AM

## 2016-09-12 NOTE — Assessment & Plan Note (Signed)
He has completed all treatment and appears to be recovering well from side effects of treatment I will space out his appointment and will continue to provide supportive care

## 2016-09-12 NOTE — Assessment & Plan Note (Signed)
He has dysgeusia due to altered taste sensation and persistent dry mouth I encouraged him to increase oral intake as tolerated

## 2016-09-12 NOTE — Therapy (Signed)
[facility] [address] [place], [place], [address] Phone: [phone]   Fax:  [phone]  Speech Language Pathology Treatment  Patient Details  Name: Andres Smith MRN: [id number] Date of Birth: May 26, 1940 Referring Provider: [name], MD  Encounter Date: 09/12/2016      End of Session - 09/12/16 1622    Visit Number 4   Number of Visits 5   Date for SLP Re-Evaluation 10/24/16   SLP Start Time 4665   SLP Stop Time  1445   SLP Time Calculation (min) 43 min   Activity Tolerance Patient tolerated treatment well      Past Medical History:  Diagnosis Date  . Ankylosing spondylitis ([place])   . Blood transfusion    hx of transfusion without reaction  . Bradycardia   . Cancer ([place]) 03/2016   oral  . Cholelithiasis   . Coronary artery disease    a. s/p CABG x 6 in 1997 (LIMA->LAD, VG->RI ->OM1->OM2, VG->PDA->PLV // b. 05/2011 Cath:  patent grafs, native prox rca and d2 dzs  ->med rx. // c. Myoview 1/18: not gated, large inferolateral scar, no ischemia; Intermediate Risk (IL scar old - on prior studies >> med rx)  . Crohn's colitis ([place]) 12/01/1997  . Diabetes mellitus   . Dysphagia   . Fatty liver   . GERD (gastroesophageal reflux disease)   . Heart block   . Hiatal hernia 12/03/2008  . History of radiation therapy 05/23/16- 07/09/16   Left Cheek/ 60 Gy in 30 fractions to gross disease, 63 Gy in 35 fractions to high risk nodal echelons, and 56 Gy in 35 fractions to intermedicate risk nodal echelons.   . History of skin cancer   . Hx of echocardiogram    Echo 5/14:  Mild LVH, EF 55-60%, NL diast fxn, mild LAE   . Hyperlipidemia   . Hypertension   . Kidney stones   . Neck pain   . Pneumonia    hx of PNA  . 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 cancer removal  2014   removed from left  outter leg  . COLONOSCOPY  2012  . CORONARY ARTERY BYPASS GRAFT  04/19/1995   x7  . EXCISION ORAL TUMOR Left    left jaw and lymph node  . HERNIA REPAIR  1989  . IR FLUORO GUIDE PORT INSERTION RIGHT  05/21/2016  . IR GASTROSTOMY TUBE MOD SED  05/21/2016  . IR US GUIDE VASC ACCESS RIGHT  05/21/2016  . LEFT HEART CATHETERIZATION WITH CORONARY/GRAFT ANGIOGRAM N/A 05/24/2011   Procedure: LEFT HEART CATHETERIZATION WITH Beatrix Fetters;  Surgeon: Larey Dresser, MD;  Location: [facility] CATH LAB;  Service: Cardiovascular;  Laterality: N/A;  . MOHS SURGERY     X 2 off chin and nose  . TONSILLECTOMY AND ADENOIDECTOMY  1944  . TRIGGER FINGER RELEASE    . UMBILICAL HERNIA REPAIR      There were no vitals filed for this visit.      Subjective Assessment - 09/12/16 1410    Subjective "I can eat a little better now."   Currently in Pain? Yes   Pain Score 7    Pain Location Abdomen   Pain Orientation Right;Left;Upper   Pain Descriptors / Indicators [place]   Pain Type Acute pain   Pain Onset 1 to 4 weeks ago  Pain Frequency Intermittent               ADULT SLP TREATMENT - 09/12/16 1415      General Information   Behavior/Cognition Alert;Cooperative;Pleasant mood     Treatment Provided   Treatment provided Dysphagia     Dysphagia Treatment   Temperature Spikes Noted No   Respiratory Status Room air   Treatment Methods Skilled observation;Therapeutic exercise;Patient/caregiver education   Patient observed directly with PO's Yes   Type of PO's observed Dysphagia 1 (puree);Thin liquids  "pills" (M&M)   Liquids provided via Cup   Oral Phase Signs & Symptoms Prolonged bolus formation   Pharyngeal Phase Signs & Symptoms Complaints of residue   Other treatment/comments Pt with PO consumption much like last session. This session however pt atempted M&M and had immediate coughing. Req'd 1/2 teaspoon of applesauce to clear pharynx. SLP recommended to pt he crush meds with applesauce  and not take them whole with applesauce. HEP was completed with occasional min A - pt completing HEP piecemeal (5 reps) throughout the day - SLP told pt to complete at LEAST 12 reps at a time, twice a day, if he was going to do piecemeal.      Assessment / Recommendations / Plan   Plan Continue with current plan of care     Dysphagia Recommendations   Diet recommendations Dysphagia 3 (mechanical soft)   Liquids provided via Cup   Medication Administration Crushed with puree   Supervision Patient able to self feed   Compensations Slow rate;Small sips/bites;Follow solids with liquid;Effortful swallow     Progression Toward Goals   Progression toward goals Progressing toward goals          SLP Education - 09/12/16 1621    Education provided Yes   Education Details HEP procedure, piecemeal HEP completion insufficient except if pt dows 12 reps/piece as opposed to x5 reps apiece   Person(s) Educated Patient;Spouse   Methods Explanation;Demonstration   Comprehension Verbalized understanding;Returned demonstration          SLP Short Term Goals - 07/19/16 1627      SLP SHORT TERM GOAL #1   Title pt will complete HEP with min A    Time --   Period --   Status Achieved  occasionally     SLP SHORT TERM GOAL #2   Title pt will tell SLP why he is completing HEP   Time --   Period --   Status Not Met          SLP Long Term Goals - 09/12/16 1623      SLP LONG TERM GOAL #1   Title pt will complete HEP with modified independence over two sessions   Time 1   Period --  visits   Status Partially Met  revised, and ongoing     SLP LONG TERM GOAL #2   Title pt will tell SLP 3 overt s/s aspiration PNA with modified independence   Time 1   Period --  visits   Status On-going     SLP LONG TERM GOAL #3   Title pt will tell SLP why a food journal is helpful in returning to most liberal diet   Status Achieved     SLP LONG TERM GOAL #4   Title pt will tell SLP when he can  reduce HEP frequency to x2/week   Time 2   Period --  visits   Status On-going  Plan - 09/12/16 1622    Clinical Impression Statement Pt with oropharyngeal swallowing WFL for dys I items and water. Pt trying to "piecemeal" the HEP at 5 reps/piece a few times a day, SLP told pt that at least 12 reps were needed to make practice worth anything. The probability of swallowing difficulty remains high after completion of chemo and radiation therapy. Pt will need to cont to be followed by SLP for regular assessment of accurate HEP completion as well as for safety with POs both during and following treatment/s.   Speech Therapy Frequency --  approx once every 4 weeks   Duration --  5 total visits   Treatment/Interventions Aspiration precaution training;Pharyngeal strengthening exercises;Diet toleration management by SLP;Compensatory techniques;Functional tasks;Patient/family education;SLP instruction and feedback   Potential to [place] provided today   Consulted and Agree with Plan of Care Patient      Patient will benefit from skilled therapeutic intervention in order to improve the following deficits and impairments:   Dysphagia, unspecified type    Problem List Patient Active Problem List   Diagnosis Date Noted  . Dysgeusia 09/12/2016  . Foot drop, right 08/23/2016  . Anemia, chronic disease 07/13/2016  . Acquired pancytopenia ([place]) 06/14/2016  . Other constipation 06/07/2016  . 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 B12 deficiency anemia 08/03/2013  . Ulcerative colitis ([place]) 01/17/2010  . Occlusion and stenosis of carotid artery without mention of cerebral infarction 09/20/2009  . GERD 12/02/2008  . Type II diabetes mellitus with manifestations ([place]) 04/27/2008  . Hyperlipidemia with target LDL less than 70 12/04/2007  . Essential hypertension, benign 12/04/2007  . Ankylosing spondylitis ([place]) 12/04/2007  .  Coronary atherosclerosis of native coronary artery 12/04/2007  . 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 Dysphagia 05/06/2007    [facility] ,[place], [place]  09/12/2016, 4:24 PM  [facility] [address] [place], [place], [address] Phone: [phone]   Fax:  [phone]   Name: Andres Smith MRN: [id number] Date of Birth: [birth year]

## 2016-09-13 ENCOUNTER — Telehealth: Payer: Self-pay | Admitting: *Deleted

## 2016-09-13 NOTE — Progress Notes (Signed)
Oncology Nurse Navigator Documentation  To provide support, encouragement and care continuity, met with Mr. Okelley and his wife during Est. Pt. appt with Dr. Alvy Bimler.  He completed weekly low dose Cisplatin 06/15/16 and RT 7/9 for buccal mucosa SCC.  He arrived with 6 lb weight gain since last visit 09/04/16. He reported:  Continuation of thickened saliva.  Using salt water/baking soda rinse daily to manage.  I encourage increased usage throughout day to maximize benefit.  Instilling 4-5 cans Osmolite 1.5 daily via PEG.  Eating some solid food but intake limited d/t lack of appetite d/t fullness, loss of taste sensation, inability to open mouth widely d/t surgery.  He voiced understanding of Dr. Calton Dach guidance to balance decrease of Osmolite with increase oral intake to reduce PEG dependency.  She encouraged keeping food diary along with home weight log.   He voiced understanding of criteria for PEG removal:  100% oral intake, stable weight.  I encouraged him to call with questions/concerns prior to his 9/28 f/u appt with Dr. Isidore Moos.  Gayleen Orem, RN, BSN, [place] [facility] Oncology Nurse [place] [facility] [place] [phone]

## 2016-09-13 NOTE — Telephone Encounter (Signed)
CALLED PATIENT TO INFORM OF CT FOR 09-27-16 - ARRIVAL TIME - 10:15 AM , PT. TO BE NPO- 4 HRS, PRIOR TO TEST, PT. TO GET RESULTS ON 09-28-16 @ 10:40 AM WITH DR. Isidore Moos

## 2016-09-13 NOTE — Telephone Encounter (Signed)
SPOKE WITH  PATIENT'S WIFE, ELIZABETH AND SHE IS AWARE OF CT ON 09-27-16 AND FU ON 09-28-16 WITH DR. Isidore Moos

## 2016-09-17 DIAGNOSIS — M456 Ankylosing spondylitis lumbar region: Secondary | ICD-10-CM | POA: Diagnosis not present

## 2016-09-17 DIAGNOSIS — M5137 Other intervertebral disc degeneration, lumbosacral region: Secondary | ICD-10-CM | POA: Diagnosis not present

## 2016-09-17 DIAGNOSIS — M47816 Spondylosis without myelopathy or radiculopathy, lumbar region: Secondary | ICD-10-CM | POA: Diagnosis not present

## 2016-09-17 DIAGNOSIS — M21371 Foot drop, right foot: Secondary | ICD-10-CM | POA: Diagnosis not present

## 2016-09-19 ENCOUNTER — Telehealth: Payer: Self-pay | Admitting: *Deleted

## 2016-09-19 MED ORDER — PREDNISONE 5 MG PO TABS: 10.0000 mg | ORAL_TABLET | Freq: Every day | ORAL | 0 refills | Status: DC

## 2016-09-19 NOTE — Telephone Encounter (Signed)
Recommend low dose 10 mg prednisone as appetite stimulant Please call in 10 mg daily, 30 tabs no refills Please take with food every morning, ask wife to call back next week for update

## 2016-09-19 NOTE — Telephone Encounter (Signed)
Oncology Nurse Navigator Documentation  Returned [phone] VMM from patient's wife stating "he's not eating, feels weak". She explained she spoke with (Triage) RN [facility]) re above, shared guidance provided.   To her concern re ill-fitting dentures, I explained most patients who have received RT for H&N cancers typically wait for several months from EOT before being refitted in order to allow oral SEs to resolve.   I encouraged oral intake of soft, high-protein/calorie foods, eg scrambled eggs w/ cheese (she indicated he had successfully eaten the other day), mashed potatoes with gravy, oat meal with butter, cottage cheese with diced fruit. I reinforced value of eating frequent small meals and keeping of food diary per Rosalind's suggestion. Wife stated he is "constantly" sipping water.  I encouraged tracking of daily consumption as part of food diary. She stated he is having regular BMs, using Miralax as needed. On another note, she stated both she and her husband I encouraged her to call Triage or me with future needs/concerns.

## 2016-09-19 NOTE — Telephone Encounter (Signed)
FYI Patient not eating well.  Lengthy call with spouse.   "This is Andres Smith (c: [phone]) calling about my husband.  Andres Smith is not eating and I do not know what to do.  Do not want to spend another nine hours in the ED.  I called the navigator but I need help.  I do not want to nag him so I called our daughter in [place] she's good with him.  [name] makes a vain attempt, only eats a few bites then says he has no desire to eat.    We were told to stop using the Osmolite but yesterday he used a little if the Osmolite.  No trouble swallowing.  No sore throat.  He blames his dentures saying they're loose, his mouth hurts a little from his dentures and he can't open his mouth as wide since Dr. Owens Shark did the surgery.  Tried K & W after last week's visit.  Soon after started eating tender salisbury steak and mashed potatoes he got choked on the meat.   A friend made boiled custard that's rich, high in calories.  He loves and eats this.   He seems weak with no fuel.  Maybe depressed or anxiety.    I didn't know he got up last night took one hydrocodone on an empty stomach so he's sleeping now.  I do not want him to have pain or nausea vomiting taking medicines on an empty stomach.  He hasn't thrown up in years.  I have not kept a diary or food journal but can tell you he eats a few bites of eggs, coffee, a few spoonfuls of potato soup, takes two to three hours to drink a milkshake with Boost and ice cream.    He gets exercise, walks, rides stationary bike at the gym with me.  Sunday he went alone and was extremely tired.      If Dr Alvy Bimler has any other advice or orders cal my mobile number."        Routing call information to navigator, collaborative nurse and provider for review.  Further patient communication through collaborative nurse. This triage nurse strongly encouraged to keep food journal, offer foods and beverages throughout the day, okay for small, quick snacks as these may be tolerated  better than three full meals a day.  Do not count calories let him eat whatever he enjoys.  A denture check for correct fitting is helpful to eat comfortably.

## 2016-09-19 NOTE — Telephone Encounter (Signed)
Called with below message. Verbalized understanding, will call next week with update.

## 2016-09-20 NOTE — Progress Notes (Signed)
  [name] presents for follow up of radiation completed 07/09/16 to his Left buccal and bilateral neck.   Pain issues, if any: He denies pain currently. He does have burning to his left tongue area.  Using a feeding tube?: Yes, he is instilling about 1 can of osmolite daily.  Weight changes, if any:  Wt Readings from Last 3 Encounters:  09/28/16 165 lb (74.8 kg)  09/12/16 176 lb 12.8 oz (80.2 kg)  09/04/16 170 lb (77.1 kg)   Swallowing issues, if any: He needs to take small bites or a small amount of food at a time. He chews his food very well. He has a decreased appetite and little desire to eat.  Smoking or chewing tobacco? No Using fluoride trays daily? No Last ENT visit was on: Dr. [name] July. His next appointment is in November per his wife Other notable issues, if any:  He tells me that he is riding his bike and walking.   BP (!) 113/57   Pulse 64   Temp 97.9 F (36.6 C)   Ht 5\' 8"  (1.727 m)   Wt 165 lb (74.8 kg)   SpO2 100% Comment: room air  BMI 25.09 kg/m

## 2016-09-24 ENCOUNTER — Other Ambulatory Visit (INDEPENDENT_AMBULATORY_CARE_PROVIDER_SITE_OTHER): Payer: PPO

## 2016-09-24 DIAGNOSIS — K51919 Ulcerative colitis, unspecified with unspecified complications: Secondary | ICD-10-CM | POA: Diagnosis not present

## 2016-09-24 LAB — COMPREHENSIVE METABOLIC PANEL
ALT: 8 U/L (ref 0–53)
AST: 16 U/L (ref 0–37)
Albumin: 4 g/dL (ref 3.5–5.2)
Alkaline Phosphatase: 88 U/L (ref 39–117)
BILIRUBIN TOTAL: 0.7 mg/dL (ref 0.2–1.2)
BUN: 18 mg/dL (ref 6–23)
CHLORIDE: 99 meq/L (ref 96–112)
CO2: 30 meq/L (ref 19–32)
Calcium: 9.5 mg/dL (ref 8.4–10.5)
Creatinine, Ser: 1.01 mg/dL (ref 0.40–1.50)
GFR: 76.12 mL/min (ref >60.00)
Glucose, Bld: 147 mg/dL — ABNORMAL HIGH (ref 70–99)
POTASSIUM: 4.3 meq/L (ref 3.5–5.1)
Sodium: 137 mEq/L (ref 135–145)
Total Protein: 7 g/dL (ref 6.0–8.3)

## 2016-09-24 LAB — CBC WITH DIFFERENTIAL/PLATELET
Basophils Absolute: 0 10*3/uL (ref 0.0–0.1)
Basophils Relative: 0.2 % (ref 0.0–3.0)
EOS PCT: 0.5 % (ref 0.0–5.0)
Eosinophils Absolute: 0 10*3/uL (ref 0.0–0.7)
HCT: 36.9 % — ABNORMAL LOW (ref 39.0–52.0)
Hemoglobin: 12.1 g/dL — ABNORMAL LOW (ref 13.0–17.0)
LYMPHS ABS: 0.5 10*3/uL — AB (ref 0.7–4.0)
Lymphocytes Relative: 7.3 % — ABNORMAL LOW (ref 12.0–46.0)
MCHC: 32.8 g/dL (ref 30.0–36.0)
MCV: 97.5 fl (ref 78.0–100.0)
MONOS PCT: 4.6 % (ref 3.0–12.0)
Monocytes Absolute: 0.3 10*3/uL (ref 0.1–1.0)
NEUTROS ABS: 5.4 10*3/uL (ref 1.4–7.7)
PLATELETS: 209 10*3/uL (ref 150.0–400.0)
RBC: 3.79 Mil/uL — ABNORMAL LOW (ref 4.22–5.81)
RDW: 15.2 % (ref 11.5–15.5)
WBC: 6.1 10*3/uL (ref 4.0–10.5)

## 2016-09-25 ENCOUNTER — Other Ambulatory Visit: Payer: Self-pay

## 2016-09-25 DIAGNOSIS — K51919 Ulcerative colitis, unspecified with unspecified complications: Secondary | ICD-10-CM

## 2016-09-26 ENCOUNTER — Encounter: Payer: Self-pay | Admitting: Internal Medicine

## 2016-09-26 ENCOUNTER — Telehealth: Payer: Self-pay | Admitting: *Deleted

## 2016-09-26 NOTE — Telephone Encounter (Signed)
Oncology Nurse Navigator Documentation  Spoke with Mr. Catlin wife re his current nutritional status. She reported:  He has been trying to increase oral intake, has eaten small amounts of soft foods eg sauteed yellow squash with onion, scrambled eggs, grits, rice pudding, milkshake, had chicken nuggets and milkshake for lunch today.  He has been instilling about 4 cans Osmolite 1.5 daily but is going to increase to 5 cans daily bco weigh loss concerns.  Currently weighs about 162 lbs. I noted he was last seen by Dory Peru, Nutrition, 06/19/16, was No Show for 6/26 appt.  I indicated I will update Barb, ask her to coordinate follow-up with and upcoming appt.  Note routed to Drs. Herbie Drape and Dory Peru, Nutrition.  Gayleen Orem, RN, BSN, [place] [facility] Oncology Nurse [facility] [place] [phone]

## 2016-09-27 ENCOUNTER — Ambulatory Visit (HOSPITAL_COMMUNITY)
Admission: RE | Admit: 2016-09-27 | Discharge: 2016-09-27 | Disposition: A | Discharge status: Home / Self Care | Payer: PPO | Source: Ambulatory Visit | Attending: Radiation Oncology | Admitting: Radiation Oncology

## 2016-09-27 DIAGNOSIS — K8020 Calculus of gallbladder without cholecystitis without obstruction: Secondary | ICD-10-CM | POA: Diagnosis not present

## 2016-09-27 DIAGNOSIS — Z85818 Personal history of malignant neoplasm of other sites of lip, oral cavity, and pharynx: Secondary | ICD-10-CM | POA: Diagnosis not present

## 2016-09-27 DIAGNOSIS — M4322 Fusion of spine, cervical region: Secondary | ICD-10-CM | POA: Insufficient documentation

## 2016-09-27 DIAGNOSIS — E042 Nontoxic multinodular goiter: Secondary | ICD-10-CM | POA: Insufficient documentation

## 2016-09-27 DIAGNOSIS — R918 Other nonspecific abnormal finding of lung field: Secondary | ICD-10-CM | POA: Diagnosis not present

## 2016-09-27 DIAGNOSIS — C060 Malignant neoplasm of cheek mucosa: Secondary | ICD-10-CM

## 2016-09-27 DIAGNOSIS — I700 Atherosclerosis of aorta: Secondary | ICD-10-CM | POA: Insufficient documentation

## 2016-09-27 DIAGNOSIS — Z9889 Other specified postprocedural states: Secondary | ICD-10-CM | POA: Insufficient documentation

## 2016-09-27 MED ORDER — IOPAMIDOL (ISOVUE-300) INJECTION 61%
INTRAVENOUS | Status: AC
  Filled 2016-09-27: qty 75

## 2016-09-27 MED ORDER — IOPAMIDOL (ISOVUE-300) INJECTION 61%
75.0000 mL | Freq: Once | INTRAVENOUS | Status: AC | PRN
  Administered 2016-09-27: 75 mL via INTRAVENOUS

## 2016-09-28 ENCOUNTER — Ambulatory Visit
Admission: RE | Admit: 2016-09-28 | Discharge: 2016-09-28 | Disposition: A | Discharge status: Home / Self Care | Payer: PPO | Source: Ambulatory Visit | Attending: Radiation Oncology | Admitting: Radiation Oncology

## 2016-09-28 ENCOUNTER — Encounter: Payer: Self-pay | Admitting: Radiation Oncology

## 2016-09-28 DIAGNOSIS — Z7982 Long term (current) use of aspirin: Secondary | ICD-10-CM | POA: Diagnosis not present

## 2016-09-28 DIAGNOSIS — Z79899 Other long term (current) drug therapy: Secondary | ICD-10-CM | POA: Diagnosis not present

## 2016-09-28 DIAGNOSIS — K1230 Oral mucositis (ulcerative), unspecified: Secondary | ICD-10-CM | POA: Diagnosis not present

## 2016-09-28 DIAGNOSIS — I2510 Atherosclerotic heart disease of native coronary artery without angina pectoris: Secondary | ICD-10-CM | POA: Diagnosis not present

## 2016-09-28 DIAGNOSIS — I10 Essential (primary) hypertension: Secondary | ICD-10-CM | POA: Insufficient documentation

## 2016-09-28 DIAGNOSIS — K760 Fatty (change of) liver, not elsewhere classified: Secondary | ICD-10-CM | POA: Insufficient documentation

## 2016-09-28 DIAGNOSIS — E042 Nontoxic multinodular goiter: Secondary | ICD-10-CM | POA: Insufficient documentation

## 2016-09-28 DIAGNOSIS — Z951 Presence of aortocoronary bypass graft: Secondary | ICD-10-CM | POA: Insufficient documentation

## 2016-09-28 DIAGNOSIS — Z87891 Personal history of nicotine dependence: Secondary | ICD-10-CM | POA: Insufficient documentation

## 2016-09-28 DIAGNOSIS — K5090 Crohn's disease, unspecified, without complications: Secondary | ICD-10-CM | POA: Diagnosis not present

## 2016-09-28 DIAGNOSIS — Z931 Gastrostomy status: Secondary | ICD-10-CM | POA: Insufficient documentation

## 2016-09-28 DIAGNOSIS — Z87442 Personal history of urinary calculi: Secondary | ICD-10-CM | POA: Insufficient documentation

## 2016-09-28 DIAGNOSIS — R42 Dizziness and giddiness: Secondary | ICD-10-CM | POA: Diagnosis not present

## 2016-09-28 DIAGNOSIS — Z510 Encounter for antineoplastic radiation therapy: Secondary | ICD-10-CM | POA: Diagnosis not present

## 2016-09-28 DIAGNOSIS — E119 Type 2 diabetes mellitus without complications: Secondary | ICD-10-CM | POA: Diagnosis not present

## 2016-09-28 DIAGNOSIS — Z8249 Family history of ischemic heart disease and other diseases of the circulatory system: Secondary | ICD-10-CM | POA: Insufficient documentation

## 2016-09-28 DIAGNOSIS — Z8261 Family history of arthritis: Secondary | ICD-10-CM | POA: Diagnosis not present

## 2016-09-28 DIAGNOSIS — E785 Hyperlipidemia, unspecified: Secondary | ICD-10-CM | POA: Diagnosis not present

## 2016-09-28 DIAGNOSIS — K5900 Constipation, unspecified: Secondary | ICD-10-CM | POA: Insufficient documentation

## 2016-09-28 DIAGNOSIS — K219 Gastro-esophageal reflux disease without esophagitis: Secondary | ICD-10-CM | POA: Insufficient documentation

## 2016-09-28 DIAGNOSIS — Z85828 Personal history of other malignant neoplasm of skin: Secondary | ICD-10-CM | POA: Insufficient documentation

## 2016-09-28 DIAGNOSIS — Z7984 Long term (current) use of oral hypoglycemic drugs: Secondary | ICD-10-CM | POA: Diagnosis not present

## 2016-09-28 DIAGNOSIS — C060 Malignant neoplasm of cheek mucosa: Secondary | ICD-10-CM | POA: Insufficient documentation

## 2016-10-02 ENCOUNTER — Other Ambulatory Visit: Payer: Self-pay | Admitting: Radiation Oncology

## 2016-10-02 DIAGNOSIS — R634 Abnormal weight loss: Secondary | ICD-10-CM

## 2016-10-02 NOTE — Progress Notes (Signed)
Radiation Oncology         [phone] ________________________________  Name: Andres Smith MRN: [id number]  Date: 09/28/2016  DOB: 02-01-1940  Follow-Up Visit Note  CC: Janith Lima, MD  [name], MD  Diagnosis and Prior Radiotherapy:    ICD-10-CM   1. 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 He is accompanied by his wife today.      Pain issues, if any: He denies pain currently. He does have burning to his left tongue area.  Using a feeding tube?: Yes, he is instilling about 1 can of osmolite daily.  Weight changes, if any:  Wt Readings from Last 3 Encounters:  09/28/16 165 lb (74.8 kg)  09/12/16 176 lb 12.8 oz (80.2 kg)  09/04/16 170 lb (77.1 kg)   Swallowing issues, if any: He needs to take small bites or a small amount of food at a time. He chews his food very well. He has a decreased appetite and little desire to eat.  Smoking or chewing tobacco? No Using fluoride trays daily? No Last ENT visit was on: Dr. [name] July. His next appointment is in November per his wife Other notable issues, if any:  he is riding his bike and walking.   ALLERGIES:  has No Known Allergies.  Meds: Current Outpatient Prescriptions  Medication Sig Dispense Refill  . aspirin EC 81 MG tablet Take 81 mg by mouth daily.    Marland Kitchen atorvastatin (LIPITOR) 80 MG tablet Take 80 mg by mouth at bedtime.    . balsalazide (COLAZAL) 750 MG capsule Take 3 capsules (2,250 mg total) by mouth 3 (three) times daily. 270 capsule 3  . cyanocobalamin (,VITAMIN B-12,) 1000 MCG/ML injection INJECT 1ML ONCE MONTHLY. 3 mL 1  . isosorbide mononitrate  (ISMO,MONOKET) 20 MG tablet Crush 2 tablets (40 mg total) by mouth 2 (two) times daily at 10 AM and 5 PM. 120 tablet 11  . magic mouthwash w/lidocaine SOLN Take 10 mLs by mouth 4 (four) times daily as needed for mouth pain. 240 mL 0  . mercaptopurine (PURINETHOL) 50 MG tablet Take 1 tablet (50 mg total) by mouth daily. Give on an empty stomach 1 hour before or 2 hours after meals. Caution: Chemotherapy. 30 tablet 1  . metFORMIN (GLUCOPHAGE) 500 MG tablet Place 1 tablet (500 mg total) into feeding tube 2 (two) times daily with a meal. 180 tablet 1  . naproxen sodium (ANAPROX) 220 MG tablet Take 220 mg by mouth 2 (two) times daily with a meal.    . nitroGLYCERIN (NITROSTAT) 0.4 MG SL tablet Place 1 tablet (0.4 mg total) under the tongue every 5 (five) minutes as needed for chest pain. Max 3 doses. In no response call 911. 25 tablet 5  . Nutritional Supplements (FEEDING SUPPLEMENT, OSMOLITE 1.5 CAL,) LIQD Give 1 and 1/2 cans Osmolite 1.5 via PEG 4 times daily with 60 mL free water before and after bolus feedings. In addition, drink or flush PEG with 240 mL free water TID. Send formula and supplies. 6 Bottle 0  . polyethylene glycol (MIRALAX / GLYCOLAX) packet Take 17 g by mouth daily  as needed for moderate constipation.    . predniSONE (DELTASONE) 5 MG tablet Take 2 tablets (10 mg total) by mouth daily with breakfast. 60 tablet 0  . SPS 15 GM/60ML suspension Place 15 g into feeding tube daily as needed.     Marland Kitchen HYDROcodone-acetaminophen (NORCO/VICODIN) 5-325 MG tablet Take 1 tablet by mouth every 6 (six) hours as needed for moderate pain or severe pain. (Patient not taking: Reported on 09/28/2016) 15 tablet 0   No current facility-administered medications for this encounter.      Physical Findings: The patient is in no acute distress. Patient is alert and oriented. Wt Readings from Last 3 Encounters:  09/28/16 165 lb (74.8 kg)  09/12/16 176 lb 12.8 oz (80.2 kg)  09/04/16 170 lb (77.1 kg)    height is  5\' 8"  (1.727 m) and weight is 165 lb (74.8 kg). His temperature is 97.9 F (36.6 C). His blood pressure is 113/57 (abnormal) and his pulse is 64. His oxygen saturation is 100%.  General: Alert and oriented, in no acute distress. HEENT: Head is normocephalic. no sign of tumor in mouth Skin: Skin in treatment fields shows satisfactory healing. No masses in neck Lymphatics: see Neck Exam Psychiatric: Judgment and insight are intact. Affect is appropriate. Chest : CTAB Heart: RRR  Abd: PEG tube present  Lab Findings: Lab Results  Component Value Date   WBC 6.1 09/24/2016   HGB 12.1 (L) 09/24/2016   HCT 36.9 (L) 09/24/2016   MCV 97.5 09/24/2016   PLT 209.0 09/24/2016   CMP     Component Value Date/Time   NA 137 09/24/2016 1523   NA 141 07/24/2016 1347   K 4.3 09/24/2016 1523   K 5.3 (H) 07/24/2016 1347   CL 99 09/24/2016 1523   CO2 30 09/24/2016 1523   CO2 30 (H) 07/24/2016 1347   GLUCOSE 147 (H) 09/24/2016 1523   GLUCOSE 173 (H) 07/24/2016 1347   GLUCOSE 98 12/12/2005 0729   BUN 18 09/24/2016 1523   BUN 23.0 07/24/2016 1347   CREATININE 1.01 09/24/2016 1523   CREATININE 1.0 07/24/2016 1347   CALCIUM 9.5 09/24/2016 1523   CALCIUM 9.5 07/24/2016 1347   PROT 7.0 09/24/2016 1523   PROT 6.6 07/24/2016 1347   ALBUMIN 4.0 09/24/2016 1523   ALBUMIN 3.5 07/24/2016 1347   AST 16 09/24/2016 1523   AST 17 07/24/2016 1347   ALT 8 09/24/2016 1523   ALT 7 07/24/2016 1347   ALKPHOS 88 09/24/2016 1523   ALKPHOS 68 07/24/2016 1347   BILITOT 0.7 09/24/2016 1523   BILITOT 0.48 07/24/2016 1347   GFRNONAA >60 08/27/2016 1614   GFRAA >60 08/27/2016 1614    Lab Results  Component Value Date   TSH 0.781 06/06/2016    Radiographic Findings: Dg Ribs Unilateral W/chest Left  Result Date: 09/04/2016 CLINICAL DATA:  Initial encounter for Patient states that he tripped and fell this morning onto his left side, pain in ribs. Hx of cardiac bypass, hernia repair. Former smoker- quit 25-30  years ago. EXAM: LEFT RIBS AND CHEST - 3+ VIEW COMPARISON:  08/27/2016 FINDINGS: Frontal view chest and two views of left-sided ribs. Frontal view of the chest demonstrates a right-sided Port-A-Cath which terminates at the low SVC. Prior median sternotomy. No pleural effusion or pneumothorax. Clear lungs. Left-sided rib films demonstrate minimally displaced posterior eighth left rib fracture. posterolateral left seventh nondisplaced fracture. Ankylosing spondylitis. IMPRESSION: Left-sided rib fractures, without pneumothorax or pleural fluid. Electronically Signed   By: Marylyn Ishihara  Jobe Igo M.D.   On: 09/04/2016 16:03   Ct Soft Tissue Neck W Contrast  Result Date: 09/27/2016 CLINICAL DATA:  Status post surgical resection of left buccal mucosa squamous cell carcinoma. Radiation and chemotherapy completed July 2018. No current complaints. EXAM: CT NECK WITH CONTRAST TECHNIQUE: Multidetector CT imaging of the neck was performed using the standard protocol following the bolus administration of intravenous contrast. CONTRAST:  28mL ISOVUE-300 IOPAMIDOL (ISOVUE-300) INJECTION 61% COMPARISON:  CT neck 05/11/2016 FINDINGS: Pharynx and larynx: --Nasopharynx: Fossae of Rosenmuller are clear. Normal adenoid tonsils for age. --Oral cavity and oropharynx: There are surgical clips in unchanged position at the left buccal mucosa. The surrounding tissues are unchanged. Mild soft tissue prominence anteriorly again seen. The remainder of the oral cavity is normal. The oropharynx is normal. --Hypopharynx: Normal vallecula and pyriform sinuses. --Larynx: Normal epiglottis and pre-epiglottic space. Normal aryepiglottic and vocal folds. --Retropharyngeal space: No abscess, effusion or lymphadenopathy. Salivary glands: --Parotid: No mass lesion or inflammation. No sialolithiasis or ductal dilatation. --Submandibular: Symmetric without inflammation. No sialolithiasis or ductal dilatation. --Sublingual: Normal. No ranula or other visible lesion  of the base of tongue and floor of mouth. Thyroid: Unchanged appearance of left thyroid lobe nodule. This measures 1.9 cm. Lymph nodes: No enlarged or abnormal density lymph nodes. Vascular: Right chest wall Port-A-Cath. Mild aortic calcific atherosclerosis. Left carotid bifurcation atherosclerosis without hemodynamically significant stenosis. Limited intracranial: Normal. Visualized orbits: Normal. Mastoids and visualized paranasal sinuses: Clear. Skeleton: Unchanged cervical ankylosis and C1-C2 degenerative change. Upper chest: Please see dedicated report of concomitant chest CT. Other: None. IMPRESSION: 1. Unchanged appearance of the left buccal mucosa resection site without evidence of disease recurrence or progression. 2. Unchanged cervical ankylosis. 3. Mild carotid and aortic atherosclerosis (ICD10-I70.0). Electronically Signed   By: Ulyses Jarred M.D.   On: 09/27/2016 14:16   Ct Chest W Contrast  Result Date: 09/27/2016 CLINICAL DATA:  Buccal mucosa squamous cell carcinoma diagnosed December 2017 status post chemotherapy and radiation therapy completed July 2018. Restaging. EXAM: CT CHEST WITH CONTRAST TECHNIQUE: Multidetector CT imaging of the chest was performed during intravenous contrast administration. CONTRAST:  22mL ISOVUE-300 IOPAMIDOL (ISOVUE-300) INJECTION 61% COMPARISON:  09/04/2016 chest radiograph. 08/27/2016 chest CT angiogram. 05/11/2016 chest CT. FINDINGS: Cardiovascular: Normal heart size. No significant pericardial fluid/thickening. Left main, left anterior descending, left circumflex and right coronary atherosclerosis status post CABG. Right internal jugular MediPort terminates at the cavoatrial junction. Atherosclerotic nonaneurysmal thoracic aorta. Normal caliber pulmonary arteries. No central pulmonary emboli. Mediastinum/Nodes: Stable multinodular goiter with dominant 2.4 cm hypodense left thyroid lobe nodule. Unremarkable esophagus. No pathologically enlarged axillary, mediastinal  or hilar lymph nodes. Lungs/Pleura: No pneumothorax. No pleural effusion. No acute consolidative airspace disease or lung masses. Multiple (at least 6) scattered solid pulmonary nodules throughout both lungs are new since 05/11/2016 chest CT and increased since 08/27/2016 chest CT, for example a 1.0 cm right middle lobe pulmonary nodule (series 6/ image 83), increased from 0.4 cm on 08/27/2016, and a 1.1 cm central left lower lobe nodule (series 6/ image 92), increased from 0.5 cm on 08/27/2016. Left lower lobe 0.5 cm solid pulmonary nodule (series 6/image 95) is stable since 05/11/2016. Upper abdomen: Cholelithiasis. Percutaneous gastrostomy tube is in place in the body of the stomach. Musculoskeletal: No aggressive appearing focal osseous lesions. Intact sternotomy wires. Marked thoracic spondylosis. IMPRESSION: 1. Multiple (at least 6) scattered solid pulmonary nodules throughout both lungs, all new since 05/11/2016 chest CT and increased since 08/27/2016 chest CT, compatible with growing pulmonary metastases. 2.  No thoracic adenopathy. 3. Additional findings include stable multinodular goiter and cholelithiasis. Aortic Atherosclerosis (ICD10-I70.0). Electronically Signed   By: Ilona Sorrel M.D.   On: 09/27/2016 15:08    Impression/Plan:    1) Head and Neck Cancer Status: highly concerning for new lung metastases, disease is controlled in H+N region  2) Nutritional Status: push oral and PEG intake, decreased appetite, 5 lb loss this month  3) Risk Factors: The patient has been educated about risk factors including alcohol and tobacco abuse; they understand that avoidance of alcohol and tobacco is important to prevent recurrences as well as other cancers.  4) Swallowing: continue SLP PRN  5) Dental: dentures to be refitted  6) Thyroid function: WNL in June, will order another TSH for next [facility] visit - then check annually via PCP. Lab Results  Component Value Date   TSH 0.781 06/06/2016    7)  Will review at ENT board and patient will see Dr Alvy Bimler on 10-10-16.  I will see him PRN, no role for RT at this time. Patient and wife are coping relatively well with this unsettling news.  I spent 20 minutes face to face with the patient and more than 50% of that time was spent in counseling and/or coordination of care. _____________________________________   [name], MD  This document serves as a record of services personally performed by [name], MD. It was created on her behalf by Maryla Morrow, a trained medical scribe. The creation of this record is based on the scribe's personal observations and the provider's statements to them. This document has been checked and approved by the attending provider.

## 2016-10-03 ENCOUNTER — Telehealth: Payer: Self-pay | Admitting: *Deleted

## 2016-10-03 NOTE — Telephone Encounter (Signed)
CALLED PATIENT TO INFORM OF LABS ON 10-10-16 @ 10:30 AM, LVM FOR A RETURN CALL

## 2016-10-03 NOTE — Telephone Encounter (Addendum)
Oncology Nurse Navigator Documentation  Per Dr. Pearlie Oyster guidance, spoke with [name] and his Andres Smith re discussion at this morning's H&N Conference, indicated systemic therapy recommended as tmt for lung nodules identified in 9/27 CT Chest.   Explained biopsy may not be required to proceed.   Indicated Dr. Alvy Bimler will discuss treatment options during next [facility] 10:00 appt.   They expressed appreciation for my call.  Gayleen Orem, RN, BSN, [place] Oncology Nurse [place] [facility] [place] [phone]

## 2016-10-10 ENCOUNTER — Telehealth: Payer: Self-pay | Admitting: Hematology and Oncology

## 2016-10-10 ENCOUNTER — Ambulatory Visit (HOSPITAL_BASED_OUTPATIENT_CLINIC_OR_DEPARTMENT_OTHER): Payer: PPO | Admitting: Hematology and Oncology

## 2016-10-10 ENCOUNTER — Ambulatory Visit
Admission: RE | Admit: 2016-10-10 | Discharge: 2016-10-10 | Disposition: A | Discharge status: Home / Self Care | Payer: PPO | Source: Ambulatory Visit | Attending: Radiation Oncology | Admitting: Radiation Oncology

## 2016-10-10 ENCOUNTER — Ambulatory Visit: Payer: PPO | Attending: Radiation Oncology

## 2016-10-10 ENCOUNTER — Encounter: Payer: Self-pay | Admitting: *Deleted

## 2016-10-10 ENCOUNTER — Encounter: Payer: Self-pay | Admitting: Hematology and Oncology

## 2016-10-10 DIAGNOSIS — C060 Malignant neoplasm of cheek mucosa: Secondary | ICD-10-CM | POA: Diagnosis not present

## 2016-10-10 DIAGNOSIS — R634 Abnormal weight loss: Secondary | ICD-10-CM | POA: Diagnosis not present

## 2016-10-10 DIAGNOSIS — R918 Other nonspecific abnormal finding of lung field: Secondary | ICD-10-CM | POA: Diagnosis not present

## 2016-10-10 DIAGNOSIS — R1310 Dysphagia, unspecified: Secondary | ICD-10-CM | POA: Diagnosis not present

## 2016-10-10 DIAGNOSIS — Z7189 Other specified counseling: Secondary | ICD-10-CM | POA: Diagnosis not present

## 2016-10-10 DIAGNOSIS — K5090 Crohn's disease, unspecified, without complications: Secondary | ICD-10-CM | POA: Diagnosis not present

## 2016-10-10 DIAGNOSIS — R0789 Other chest pain: Secondary | ICD-10-CM

## 2016-10-10 LAB — TSH: TSH: 1.971 m[IU]/L (ref 0.320–4.118)

## 2016-10-10 NOTE — Assessment & Plan Note (Signed)
He has multiple enlarging pulmonary nodules, worrisome for metastatic disease He is not symptomatic The location of the lung nodules are difficult for biopsy We discussed the risk and benefits of biopsy versus surveillance imaging Given his frail status, I would not recommend biopsy We discussed implication that the enlarging lung nodules might represent metastatic disease We also discussed briefly treatment options available including intravenous methotrexate or immunotherapy With his ongoing active Crohn's disease, immunotherapy is relatively contraindicated

## 2016-10-10 NOTE — Progress Notes (Signed)
Oncology Nurse Navigator Documentation  Andres Smith and his wife attended the Fall 2018 H&N [facility] survivorship series, 5 consecutive Tuesday evenings 6:00-7:15 pm beginning 09/11/16.  Gayleen Orem, RN, BSN, [place] Oncology Nurse [place] [facility] [place] [phone]

## 2016-10-10 NOTE — Telephone Encounter (Signed)
Gave avs and calendar for October and November  °

## 2016-10-10 NOTE — Assessment & Plan Note (Signed)
The patient is aware he has incurable disease and treatment is strictly palliative. We discussed importance of Advanced Directives and Living will. We discussed CODE STATUS; the patient is undecided We discussed goals of care of treatment We discussed prognosis We will recent visit this issue in his next visit

## 2016-10-10 NOTE — Patient Instructions (Signed)
Signs of Aspiration Pneumonia   . Chest pain/tightness . Fever (can be low grade) . Cough  o With foul-smelling phlegm (sputum) o With sputum containing pus or blood o With greenish sputum . Fatigue  . Shortness of breath  . Wheezing   **IF YOU HAVE THESE SIGNS, CONTACT YOUR DOCTOR OR GO TO THE EMERGENCY DEPARTMENT OR URGENT CARE AS SOON AS POSSIBLE**      

## 2016-10-10 NOTE — Progress Notes (Signed)
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 Tumor measures 0.9 cm in greatest dimension. Tumor extends to the inked deep tissue edge. Depth of invasion: 4 mm (in this material) See note.  Note: an immunostains for P16 was performed at an outside hospital and provided for [place] to review; p16 immunostain is negative. Per outside report, In situ hybridization for high-risk HPV types showed no evidence of transcriptionally active high-risk HPV types.      03/21/2016 Initial Diagnosis    Salient findings:  -EAC's clear -Lesion within left buccal mucosa (pictured below) with about 60m depth. Width is about 2/3 of the left buccal mucosal surface area by palpation. Extends to the oral commissure.  -Tongue soft and mobile without mucosal lesions -Neck soft with no palpable neck masses -Right handed -Right hand is pink and warm with Allen's test negative (normal)  -Left forearm very sun damaged with multiple treated areas from precancerous or cancerous areas. Right forearm not as dramatic, presumably from sun exposure while driving      [id number]/1194Surgery    RESECTION OF ORAL BUCCAL MUCOSA SELECTIVE NECK DISSECTION FREE FLAP RADIAL FOREARM PR SPLIT GInkster<100 SQCM [15100] (SPLIT THICKNESS SKIN GRAFT LEG         04/13/2016 Pathology Results    A.LEFT ORAL COMMISSURE INFERIOR, EXCISION: No malignancy identified.  B.LEFT ORAL COMMISSURE SUPERIOR, EXCISION: No malignancy identified.  C.LEFT DEEP ORAL COMMISSURE, EXCISION: No malignancy identified.  D.RIGHT POSTERIOR BUCCAL INFERIOR, EXCISION: No malignancy identified.  E.RIGHT POSTERIOR BUCCAL SUPERIOR, EXCISION: No malignancy identified.  F.ORAL CAVITY, LEFT BUCCAL, EXCISION:  Invasive squamous cell carcinoma, moderately differentiated,  keratinizing. Tumor size:2.2 cm. Tumor depth of invasion:0.8 cm. Perineural invasion identified. Margins negative for malignancy. Pathologic stage:pT2 pN2a. See tumor protocol summary below.  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 Tumor deposit size:1.1 cm. Extranodal extension present. Benign salivary gland tissue.  SURGICAL PATHOLOGY CANCER CASE SUMMARY (AJCC 8TH EDITION) LIP AND ORAL CAVITY CAP Protocol posting date: June, 2017 Version: LipOralCavity 4.0.0.0  PROCEDURE: Wide resection of buccal mucosa TUMOR SITE:Oral:Buccal mucosa TUMOR LATERALITY: Left TUMOR FOCALITY: Unifocal TUMOR SIZE:Greatest dimension: 2.2 cm TUMOR DEPTH OF INVASION (DOI):8 mm HISTOLOGIC TYPE:Squamous cell carcinoma, conventional HISTOLOGIC GRADE:G2: Moderately differentiated SPECIMEN MARGINS:Uninvolved by invasive carcinoma  DISTANCE FROM CLOSEST MARGIN:1 mm  SPECIFY MARGIN(S):Deep LYMPHOVASCULAR INVASION:Present PERINEURAL INVASION: Present REGIONAL LYMPH NODES: NUMBER OF LYMPH NODES INVOLVED: 1 NUMBER OF LYMPH NODES EXAMINED: 4 LATERALITY OF LYMPH NODES INVOLVED: Ipsilateral SIZE OF LARGEST METASTATIC DEPOSIT:1.1 cm EXTRANODAL EXTENSION:Present PATHOLOGIC STAGE CLASSIFICATION (pTNM, AJCC 8th  Ed): pT2 pN2a pT2: umor >2 cm but <=4 cm and <=10 mm DOI pN2a:Metastasis in a single ipsilateral lymph node, 3 cm or  smaller in greatest dimension and ENE(+) BIOMARKERS:performed on prior outside biopsy ([facility] case [id number] outside case [id number] P16 (Immunohistochemistry):Negative HPV (high-risk types by in situ hybridization):Reported as negative      05/11/2016 Imaging    1.  4 mm left lower lobe pulmonary nodule. Attention on follow-up imaging recommended as metastatic disease not excluded. 2. 2.8 cm left thyroid nodule. Thyroid ultrasound recommended to further evaluate. 3. Bilateral renal cysts. 4. Coronary artery and thoracoabdominal aortic atherosclerosis. 5. Ankylosing spondylitis.        05/11/2016 Imaging    CT neck: Surgical clips in the region of the left buccal mucosa, presumably at the previous primary site. Mild scarring in that region measuring about 8 mm. Cannot assess for residual or recurrent disease in that location, but this may simply be scarring. Previous left submandibular resection and left neck node dissection. No abnormal nodes presently. Multinodular goiter. Ankylosing spondylitis.  C1-2 remains a mobile segment.      05/21/2016 Procedure    1. Successful placement of a right internal jugular approach power injectable Port-A-Cath. The Port a catheter is ready for immediate use. 2. Successful fluoroscopic insertion of a 20-French pull-through gastrostomy tube. The gastrostomy may be used immediately for medication administration and may be utilized in 24 hrs for the initiation of feeds.      05/23/2016 - 07/09/2016 Radiation Therapy    Site/dose:     Left Cheek and bilateral neck / 60 Gy in 30 fractions to gross disease, 54 Gy in 30 fractions to intermediate risk nodal echelons Beams/energy:   IMRT / 6 MV photons       05/25/2016 - 06/15/2016 Chemotherapy    He received weekly reduced dose cisplatin       06/21/2016 Adverse  Reaction    Cycle 5 is delayed due to pancytopenia      06/26/2016 - 06/29/2016 Hospital Admission    He was admitted for management of neutropenic fever      08/27/2016 Imaging    1. Negative for pneumothorax or pleural effusion 2. Acute right eighth rib fracture. Questionable fracture deformity of the right ninth rib.      09/04/2016 Imaging    Left-sided rib fractures, without pneumothorax or pleural fluid       INTERVAL HISTORY: Please see below for problem oriented charting. He is returning with his family members including Mickel Baas and Yahir, his children for further discussion The patient have lost some weight since he was last seen  His appetite has mildly improved with steroid therapy His Crohn's disease appears to be well controlled No nausea or vomiting  He denies recent cough, chest pain or shortness of breath He continued to have chest wall discomfort from prior rib fracture He denies dysphagia  REVIEW OF SYSTEMS:   Constitutional: Denies fevers, chills Eyes: Denies blurriness of vision Ears, nose, mouth, throat, and face: Denies mucositis or sore throat Respiratory: Denies cough, dyspnea or wheezes Cardiovascular: Denies palpitation, chest discomfort or lower extremity swelling Gastrointestinal:  Denies nausea, heartburn or change in bowel habits Skin: Denies abnormal skin rashes Lymphatics: Denies new lymphadenopathy or easy bruising Neurological:Denies numbness, tingling or new weaknesses Behavioral/Psych: Mood is stable, no new changes  All other systems were reviewed with the patient and are negative.  I have reviewed the past medical history, past surgical history, social history and family history with the patient and they are unchanged from previous note.  ALLERGIES:  has No Known Allergies.  MEDICATIONS:  Current Outpatient Prescriptions  Medication Sig Dispense Refill  . aspirin EC 81 MG tablet Take 81 mg by mouth daily.    Marland Kitchen atorvastatin (LIPITOR) 80  MG tablet Take 80 mg by mouth at bedtime.    . balsalazide (COLAZAL) 750 MG  capsule Take 3 capsules (2,250 mg total) by mouth 3 (three) times daily. 270 capsule 3  . cyanocobalamin (,VITAMIN B-12,) 1000 MCG/ML injection INJECT 1ML ONCE MONTHLY. 3 mL 1  . HYDROcodone-acetaminophen (NORCO/VICODIN) 5-325 MG tablet Take 1 tablet by mouth every 6 (six) hours as needed for moderate pain or severe pain. (Patient not taking: Reported on 09/28/2016) 15 tablet 0  . isosorbide mononitrate (ISMO,MONOKET) 20 MG tablet Crush 2 tablets (40 mg total) by mouth 2 (two) times daily at 10 AM and 5 PM. 120 tablet 11  . magic mouthwash w/lidocaine SOLN Take 10 mLs by mouth 4 (four) times daily as needed for mouth pain. 240 mL 0  . mercaptopurine (PURINETHOL) 50 MG tablet Take 1 tablet (50 mg total) by mouth daily. Give on an empty stomach 1 hour before or 2 hours after meals. Caution: Chemotherapy. 30 tablet 1  . metFORMIN (GLUCOPHAGE) 500 MG tablet Place 1 tablet (500 mg total) into feeding tube 2 (two) times daily with a meal. 180 tablet 1  . naproxen sodium (ANAPROX) 220 MG tablet Take 220 mg by mouth 2 (two) times daily with a meal.    . nitroGLYCERIN (NITROSTAT) 0.4 MG SL tablet Place 1 tablet (0.4 mg total) under the tongue every 5 (five) minutes as needed for chest pain. Max 3 doses. In no response call 911. 25 tablet 5  . Nutritional Supplements (FEEDING SUPPLEMENT, OSMOLITE 1.5 CAL,) LIQD Give 1 and 1/2 cans Osmolite 1.5 via PEG 4 times daily with 60 mL free water before and after bolus feedings. In addition, drink or flush PEG with 240 mL free water TID. Send formula and supplies. 6 Bottle 0  . polyethylene glycol (MIRALAX / GLYCOLAX) packet Take 17 g by mouth daily as needed for moderate constipation.    . predniSONE (DELTASONE) 5 MG tablet Take 2 tablets (10 mg total) by mouth daily with breakfast. 60 tablet 0  . SPS 15 GM/60ML suspension Place 15 g into feeding tube daily as needed.      No current  facility-administered medications for this visit.     PHYSICAL EXAMINATION: ECOG PERFORMANCE STATUS: 2 - Symptomatic, <50% confined to bed  Vitals:   10/10/16 1009  BP: 129/81  Pulse: 76  Resp: 18  Temp: 97.6 F (36.4 C)  SpO2: 99%   Filed Weights   10/10/16 1009  Weight: 160 lb 6.4 oz (72.8 kg)    GENERAL:alert, no distress and comfortable SKIN: skin color, texture, turgor are normal, no rashes or significant lesions EYES: normal, Conjunctiva are pink and non-injected, sclera clear OROPHARYNX:no exudate, no erythema and lips, buccal mucosa, and tongue normal  NECK: supple, thyroid normal size, non-tender, without nodularity LYMPH:  no palpable lymphadenopathy in the cervical, axillary or inguinal LUNGS: clear to auscultation and percussion with normal breathing effort HEART: regular rate & rhythm and no murmurs and no lower extremity edema ABDOMEN:abdomen soft, non-tender and normal bowel sounds Musculoskeletal:no cyanosis of digits and no clubbing  NEURO: alert & oriented x 3 with fluent speech, no focal motor/sensory deficits  LABORATORY DATA:  I have reviewed the data as listed    Component Value Date/Time   NA 137 09/24/2016 1523   NA 141 07/24/2016 1347   K 4.3 09/24/2016 1523   K 5.3 (H) 07/24/2016 1347   CL 99 09/24/2016 1523   CO2 30 09/24/2016 1523   CO2 30 (H) 07/24/2016 1347   GLUCOSE 147 (H) 09/24/2016 1523   GLUCOSE 173 (H) 07/24/2016 1347  GLUCOSE 98 12/12/2005 0729   BUN 18 09/24/2016 1523   BUN 23.0 07/24/2016 1347   CREATININE 1.01 09/24/2016 1523   CREATININE 1.0 07/24/2016 1347   CALCIUM 9.5 09/24/2016 1523   CALCIUM 9.5 07/24/2016 1347   PROT 7.0 09/24/2016 1523   PROT 6.6 07/24/2016 1347   ALBUMIN 4.0 09/24/2016 1523   ALBUMIN 3.5 07/24/2016 1347   AST 16 09/24/2016 1523   AST 17 07/24/2016 1347   ALT 8 09/24/2016 1523   ALT 7 07/24/2016 1347   ALKPHOS 88 09/24/2016 1523   ALKPHOS 68 07/24/2016 1347   BILITOT 0.7 09/24/2016 1523    BILITOT 0.48 07/24/2016 1347   GFRNONAA >60 08/27/2016 1614   GFRAA >60 08/27/2016 1614    No results found for: SPEP, UPEP  Lab Results  Component Value Date   WBC 6.1 09/24/2016   NEUTROABS 5.4 09/24/2016   HGB 12.1 (L) 09/24/2016   HCT 36.9 (L) 09/24/2016   MCV 97.5 09/24/2016   PLT 209.0 09/24/2016      Chemistry      Component Value Date/Time   NA 137 09/24/2016 1523   NA 141 07/24/2016 1347   K 4.3 09/24/2016 1523   K 5.3 (H) 07/24/2016 1347   CL 99 09/24/2016 1523   CO2 30 09/24/2016 1523   CO2 30 (H) 07/24/2016 1347   BUN 18 09/24/2016 1523   BUN 23.0 07/24/2016 1347   CREATININE 1.01 09/24/2016 1523   CREATININE 1.0 07/24/2016 1347      Component Value Date/Time   CALCIUM 9.5 09/24/2016 1523   CALCIUM 9.5 07/24/2016 1347   ALKPHOS 88 09/24/2016 1523   ALKPHOS 68 07/24/2016 1347   AST 16 09/24/2016 1523   AST 17 07/24/2016 1347   ALT 8 09/24/2016 1523   ALT 7 07/24/2016 1347   BILITOT 0.7 09/24/2016 1523   BILITOT 0.48 07/24/2016 1347       RADIOGRAPHIC STUDIES: I have reviewed multiple imaging studies with him and family I have personally reviewed the radiological images as listed and agreed with the findings in the report. Ct Soft Tissue Neck W Contrast  Result Date: 09/27/2016 CLINICAL DATA:  Status post surgical resection of left buccal mucosa squamous cell carcinoma. Radiation and chemotherapy completed July 2018. No current complaints. EXAM: CT NECK WITH CONTRAST TECHNIQUE: Multidetector CT imaging of the neck was performed using the standard protocol following the bolus administration of intravenous contrast. CONTRAST:  81mL ISOVUE-300 IOPAMIDOL (ISOVUE-300) INJECTION 61% COMPARISON:  CT neck 05/11/2016 FINDINGS: Pharynx and larynx: --Nasopharynx: Fossae of Rosenmuller are clear. Normal adenoid tonsils for age. --Oral cavity and oropharynx: There are surgical clips in unchanged position at the left buccal mucosa. The surrounding tissues are unchanged.  Mild soft tissue prominence anteriorly again seen. The remainder of the oral cavity is normal. The oropharynx is normal. --Hypopharynx: Normal vallecula and pyriform sinuses. --Larynx: Normal epiglottis and pre-epiglottic space. Normal aryepiglottic and vocal folds. --Retropharyngeal space: No abscess, effusion or lymphadenopathy. Salivary glands: --Parotid: No mass lesion or inflammation. No sialolithiasis or ductal dilatation. --Submandibular: Symmetric without inflammation. No sialolithiasis or ductal dilatation. --Sublingual: Normal. No ranula or other visible lesion of the base of tongue and floor of mouth. Thyroid: Unchanged appearance of left thyroid lobe nodule. This measures 1.9 cm. Lymph nodes: No enlarged or abnormal density lymph nodes. Vascular: Right chest wall Port-A-Cath. Mild aortic calcific atherosclerosis. Left carotid bifurcation atherosclerosis without hemodynamically significant stenosis. Limited intracranial: Normal. Visualized orbits: Normal. Mastoids and visualized paranasal sinuses: Clear. Skeleton: Unchanged  cervical ankylosis and C1-C2 degenerative change. Upper chest: Please see dedicated report of concomitant chest CT. Other: None. IMPRESSION: 1. Unchanged appearance of the left buccal mucosa resection site without evidence of disease recurrence or progression. 2. Unchanged cervical ankylosis. 3. Mild carotid and aortic atherosclerosis (ICD10-I70.0). Electronically Signed   By: Ulyses Jarred M.D.   On: 09/27/2016 14:16   Ct Chest W Contrast  Result Date: 09/27/2016 CLINICAL DATA:  Buccal mucosa squamous cell carcinoma diagnosed December 2017 status post chemotherapy and radiation therapy completed July 2018. Restaging. EXAM: CT CHEST WITH CONTRAST TECHNIQUE: Multidetector CT imaging of the chest was performed during intravenous contrast administration. CONTRAST:  64mL ISOVUE-300 IOPAMIDOL (ISOVUE-300) INJECTION 61% COMPARISON:  09/04/2016 chest radiograph. 08/27/2016 chest CT  angiogram. 05/11/2016 chest CT. FINDINGS: Cardiovascular: Normal heart size. No significant pericardial fluid/thickening. Left main, left anterior descending, left circumflex and right coronary atherosclerosis status post CABG. Right internal jugular MediPort terminates at the cavoatrial junction. Atherosclerotic nonaneurysmal thoracic aorta. Normal caliber pulmonary arteries. No central pulmonary emboli. Mediastinum/Nodes: Stable multinodular goiter with dominant 2.4 cm hypodense left thyroid lobe nodule. Unremarkable esophagus. No pathologically enlarged axillary, mediastinal or hilar lymph nodes. Lungs/Pleura: No pneumothorax. No pleural effusion. No acute consolidative airspace disease or lung masses. Multiple (at least 6) scattered solid pulmonary nodules throughout both lungs are new since 05/11/2016 chest CT and increased since 08/27/2016 chest CT, for example a 1.0 cm right middle lobe pulmonary nodule (series 6/ image 83), increased from 0.4 cm on 08/27/2016, and a 1.1 cm central left lower lobe nodule (series 6/ image 92), increased from 0.5 cm on 08/27/2016. Left lower lobe 0.5 cm solid pulmonary nodule (series 6/image 95) is stable since 05/11/2016. Upper abdomen: Cholelithiasis. Percutaneous gastrostomy tube is in place in the body of the stomach. Musculoskeletal: No aggressive appearing focal osseous lesions. Intact sternotomy wires. Marked thoracic spondylosis. IMPRESSION: 1. Multiple (at least 6) scattered solid pulmonary nodules throughout both lungs, all new since 05/11/2016 chest CT and increased since 08/27/2016 chest CT, compatible with growing pulmonary metastases. 2. No thoracic adenopathy. 3. Additional findings include stable multinodular goiter and cholelithiasis. Aortic Atherosclerosis (ICD10-I70.0). Electronically Signed   By: Ilona Sorrel M.D.   On: 09/27/2016 15:08    ASSESSMENT & PLAN:  Buccal mucosa squamous cell carcinoma (HCC) He has no signs of local recurrence However, CT  imaging of the chest show enlarging lung nodules, suspicious for metastatic disease The patient is frail, has lost some weight and is in poor performance status due to weight loss and inflammatory bowel disease For now, I recommend active supportive care and surveillance imaging in a few months  Multiple pulmonary nodules He has multiple enlarging pulmonary nodules, worrisome for metastatic disease He is not symptomatic The location of the lung nodules are difficult for biopsy We discussed the risk and benefits of biopsy versus surveillance imaging Given his frail status, I would not recommend biopsy We discussed implication that the enlarging lung nodules might represent metastatic disease We also discussed briefly treatment options available including intravenous methotrexate or immunotherapy With his ongoing active Crohn's disease, immunotherapy is relatively contraindicated  Weight loss He has recent weight loss due to poor appetite and dysgeusia He is improving with steroid therapy I recommend he use his feeding tube for nutritional supplement  Goals of care, counseling/discussion The patient is aware he has incurable disease and treatment is strictly palliative. We discussed importance of Advanced Directives and Living will. We discussed CODE STATUS; the patient is undecided We discussed goals of  care of treatment We discussed prognosis We will recent visit this issue in his next visit   No orders of the defined types were placed in this encounter.  All questions were answered. The patient knows to call the clinic with any problems, questions or concerns. No barriers to learning was detected. I spent 50 minutes counseling the patient face to face. The total time spent in the appointment was 80 minutes and more than 50% was on counseling and review of test results     [name], MD 10/10/2016 2:15 PM

## 2016-10-10 NOTE — Therapy (Signed)
[facility] [address] [place], [place], [address] Phone: [phone]   Fax:  [phone]  Speech Language Pathology Treatment  Patient Details  Name: Andres Smith MRN: [id number] Date of Birth: [birth year] Referring Provider: [name], MD  Encounter Date: 10/10/2016      End of Session - 10/10/16 1623    Visit Number 5   Number of Visits 5   Date for SLP Re-Evaluation 10/24/16   SLP Start Time 5681   SLP Stop Time  1445   SLP Time Calculation (min) 42 min   Activity Tolerance Patient tolerated treatment well      Past Medical History:  Diagnosis Date  . Ankylosing spondylitis ([place])   . Blood transfusion    hx of transfusion without reaction  . Bradycardia   . Cancer ([place]) 03/2016   oral  . Cholelithiasis   . Coronary artery disease    a. s/p CABG x 6 in 1997 (LIMA->LAD, VG->RI ->OM1->OM2, VG->PDA->PLV // b. 05/2011 Cath:  patent grafs, native prox rca and d2 dzs  ->med rx. // c. Myoview 1/18: not gated, large inferolateral scar, no ischemia; Intermediate Risk (IL scar old - on prior studies >> med rx)  . Crohn's colitis ([place]) 12/01/1997  . Diabetes mellitus   . Dysphagia   . Fatty liver   . GERD (gastroesophageal reflux disease)   . Heart block   . Hiatal hernia 12/03/2008  . History of radiation therapy 05/23/16- 07/09/16   Left Cheek/ 60 Gy in 30 fractions to gross disease, 63 Gy in 35 fractions to high risk nodal echelons, and 56 Gy in 35 fractions to intermedicate risk nodal echelons.   . History of skin cancer   . Hx of echocardiogram    Echo 5/14:  Mild LVH, EF 55-60%, NL diast fxn, mild LAE   . Hyperlipidemia   . Hypertension   . Kidney stones   . Neck pain   . Pneumonia    hx of PNA  . 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 cancer removal  2014   removed from left  outter leg  . COLONOSCOPY  2012  . CORONARY ARTERY BYPASS GRAFT  04/19/1995   x7  . EXCISION ORAL TUMOR Left    left jaw and lymph node  . HERNIA REPAIR  1989  . IR FLUORO GUIDE PORT INSERTION RIGHT  05/21/2016  . IR GASTROSTOMY TUBE MOD SED  05/21/2016  . IR US GUIDE VASC ACCESS RIGHT  05/21/2016  . LEFT HEART CATHETERIZATION WITH CORONARY/GRAFT ANGIOGRAM N/A 05/24/2011   Procedure: LEFT HEART CATHETERIZATION WITH Beatrix Fetters;  Surgeon: Larey Dresser, MD;  Location[facility] CATH LAB;  Service: Cardiovascular;  Laterality: N/A;  . MOHS SURGERY     X 2 off chin and nose  . TONSILLECTOMY AND ADENOIDECTOMY  1944  . TRIGGER FINGER RELEASE    . UMBILICAL HERNIA REPAIR      There were no vitals filed for this visit.      Subjective Assessment - 10/10/16 1412    Subjective "My swallowing is doing is good.", "Boy that ketchup burned my tongue."               ADULT SLP TREATMENT - 10/10/16 1413      General Information   Behavior/Cognition Alert;Cooperative;Pleasant mood     Treatment Provided   Treatment  provided Cognitive-Linquistic     Dysphagia Treatment   Temperature Spikes Noted No   Respiratory Status Room air   Treatment Methods Skilled observation;Therapeutic exercise;Patient/caregiver education   Patient observed directly with PO's Yes   Type of PO's observed Dysphagia 3 (soft);Thin liquids   Liquids provided via Cup   Oral Phase Signs & Symptoms Prolonged bolus formation  "It's sticky" (d/t xerostomia)   Pharyngeal Phase Signs & Symptoms --  none with applesauce ; pt used liquid wash spontaneously   Other treatment/comments [employer] hamburger was ingested without difficulty when removed bun. Ketchup burned pt's mouth due to acidity. Pt hasn't been doing exercises as prescribed - "When I think about it, I do it." Pt reports once a day routinely, some exercises BID - approx 10-15 reps each. SLP encouraged BID HEP and 15-20 reps. Pt has likely lung CA  (suspected) and stated, "This (swallowing) is important Glendell Docker but it's not the thrust right now." MD Alvy Bimler) did not remove pt's PEG as planned as pt has lost 15 (reported) lbs. since last MD visit. No overt s/s aspiration. Pain with Masako, SLP suggested 3 reps Masako alternating with another exercise until pt gets 18-20 reps. Procedure on all exercises was WNL. Pt told SLP 3 overt s/s aspiration PNA with modified independence.     Pain Assessment   Pain Assessment 0-10   Pain Score 3    Pain Location abdomen   Pain Descriptors / Indicators Constant;Sore   Pain Intervention(s) Monitored during session     Assessment / Recommendations / Plan   Plan --  see pt in December due to WNL HEP and safe POs     Dysphagia Recommendations   Diet recommendations Dysphagia 3 (mechanical soft);Thin liquid   Liquids provided via Cup   Medication Administration Crushed with puree   Supervision Patient able to self feed   Compensations Slow rate;Small sips/bites;Follow solids with liquid;Effortful swallow     Progression Toward Goals   Progression toward goals Progressing toward goals            SLP Short Term Goals - 07/19/16 1627      SLP SHORT TERM GOAL #1   Title pt will complete HEP with min A    Time --   Period --   Status Achieved  occasionally     SLP SHORT TERM GOAL #2   Title pt will tell SLP why he is completing HEP   Time --   Period --   Status Not Met          SLP Long Term Goals - 10/10/16 1627      SLP LONG TERM GOAL #1   Title pt will complete HEP with modified independence over two sessions   Baseline 10-10-16   Time 1   Period --  visits   Status Partially Met  and ongoing     SLP LONG TERM GOAL #2   Title pt will tell SLP 3 overt s/s aspiration PNA with modified independence   Status Achieved     SLP LONG TERM GOAL #3   Title pt will tell SLP why a food journal is helpful in returning to most liberal diet   Status Achieved     SLP LONG TERM GOAL  #4   Title pt will tell SLP when he can reduce HEP frequency to x2/week   Time 1   Period --  visits   Status On-going          Plan -  10/10/16 1624    Clinical Impression Statement Pt with oropharyngeal swallowing WFL for dys I and dys III items and water. Extra time necessary for dys III due to bready/pasty item which "sticks" due to xerostomia. Pt cont to "piecemeal" the HEP at approx 10 reps/piece once a day, SLP told pt that at least 15 reps were optimal, twice a day. Because pt is functional/safe with POs and due to exercises WNL/modified independence, pt will be seen in December for assessment of cont'd accurate HEP completion as well as for cont'd safety with POs.   Speech Therapy Frequency --  approx once every 4 weeks   Duration --  5 total visits   Treatment/Interventions Aspiration precaution training;Pharyngeal strengthening exercises;Diet toleration management by SLP;Compensatory techniques;Functional tasks;Patient/family education;SLP instruction and feedback   Potential to [place] provided today   Consulted and Agree with Plan of Care Patient      Patient will benefit from skilled therapeutic intervention in order to improve the following deficits and impairments:   Dysphagia, unspecified type    Problem List Patient Active Problem List   Diagnosis Date Noted  . Multiple pulmonary nodules 10/10/2016  . Goals of care, counseling/discussion 10/10/2016  . Dysgeusia 09/12/2016  . Foot drop, right 08/23/2016  . Anemia, chronic disease 07/13/2016  . Acquired pancytopenia ([place]) 06/14/2016  . Other constipation 06/07/2016  . Weight loss 05/31/2016  . 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 B12 deficiency anemia 08/03/2013  . Ulcerative colitis ([place]) 01/17/2010  . Occlusion and stenosis of carotid artery without mention of cerebral infarction 09/20/2009  . GERD 12/02/2008  . Type II diabetes mellitus with  manifestations ([place]) 04/27/2008  . Hyperlipidemia with target LDL less than 70 12/04/2007  . Essential hypertension, benign 12/04/2007  . Ankylosing spondylitis ([place]) 12/04/2007  . Coronary atherosclerosis of native coronary artery 12/04/2007  . 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 Dysphagia 05/06/2007    [facility] ,[place], [place]  10/10/2016, 4:30 PM  [facility] [address] [place], [place], [address] Phone: [phone]   Fax:  [phone]   Name: Andres Smith MRN: [id number] Date of Birth: Jul 02, 1940

## 2016-10-10 NOTE — Assessment & Plan Note (Signed)
He has recent weight loss due to poor appetite and dysgeusia He is improving with steroid therapy I recommend he use his feeding tube for nutritional supplement

## 2016-10-10 NOTE — Assessment & Plan Note (Signed)
He has no signs of local recurrence However, CT imaging of the chest show enlarging lung nodules, suspicious for metastatic disease The patient is frail, has lost some weight and is in poor performance status due to weight loss and inflammatory bowel disease For now, I recommend active supportive care and surveillance imaging in a few months

## 2016-10-11 ENCOUNTER — Ambulatory Visit (HOSPITAL_BASED_OUTPATIENT_CLINIC_OR_DEPARTMENT_OTHER): Payer: PPO

## 2016-10-11 ENCOUNTER — Encounter: Payer: Self-pay | Admitting: *Deleted

## 2016-10-11 VITALS — BP 115/55 | HR 80 | Temp 97.9°F | Resp 20

## 2016-10-11 DIAGNOSIS — Z23 Encounter for immunization: Secondary | ICD-10-CM

## 2016-10-11 DIAGNOSIS — C060 Malignant neoplasm of cheek mucosa: Secondary | ICD-10-CM

## 2016-10-11 DIAGNOSIS — Z95828 Presence of other vascular implants and grafts: Secondary | ICD-10-CM

## 2016-10-11 MED ORDER — SODIUM CHLORIDE 0.9% FLUSH
10.0000 mL | INTRAVENOUS | Status: DC | PRN
  Administered 2016-10-11: 10 mL via INTRAVENOUS
  Filled 2016-10-11: qty 10

## 2016-10-11 MED ORDER — INFLUENZA VAC SPLIT QUAD 0.5 ML IM SUSY
0.5000 mL | PREFILLED_SYRINGE | Freq: Once | INTRAMUSCULAR | Status: AC
  Administered 2016-10-11: 0.5 mL via INTRAMUSCULAR
  Filled 2016-10-11: qty 0.5

## 2016-10-11 MED ORDER — HEPARIN SOD (PORK) LOCK FLUSH 100 UNIT/ML IV SOLN
500.0000 [IU] | Freq: Once | INTRAVENOUS | Status: AC
  Administered 2016-10-11: 500 [IU] via INTRAVENOUS
  Filled 2016-10-11: qty 5

## 2016-10-11 NOTE — Patient Instructions (Addendum)
Implanted Port Home Guide An implanted port is a type of central line that is placed under the skin. Central lines are used to provide IV access when treatment or nutrition needs to be given through a person's veins. Implanted ports are used for long-term IV access. An implanted port may be placed because:  You need IV medicine that would be irritating to the small veins in your hands or arms.  You need long-term IV medicines, such as antibiotics.  You need IV nutrition for a long period.  You need frequent blood draws for lab tests.  You need dialysis.  Implanted ports are usually placed in the chest area, but they can also be placed in the upper arm, the abdomen, or the leg. An implanted port has two main parts:  Reservoir. The reservoir is round and will appear as a small, raised area under your skin. The reservoir is the part where a needle is inserted to give medicines or draw blood.  Catheter. The catheter is a thin, flexible tube that extends from the reservoir. The catheter is placed into a large vein. Medicine that is inserted into the reservoir goes into the catheter and then into the vein.  How will I care for my incision site? Do not get the incision site wet. Bathe or shower as directed by your health care provider. How is my port accessed? Special steps must be taken to access the port:  Before the port is accessed, a numbing cream can be placed on the skin. This helps numb the skin over the port site.  Your health care provider uses a sterile technique to access the port. ? Your health care provider must put on a mask and sterile gloves. ? The skin over your port is cleaned carefully with an antiseptic and allowed to dry. ? The port is gently pinched between sterile gloves, and a needle is inserted into the port.  Only "non-coring" port needles should be used to access the port. Once the port is accessed, a blood return should be checked. This helps ensure that the port  is in the vein and is not clogged.  If your port needs to remain accessed for a constant infusion, a clear (transparent) bandage will be placed over the needle site. The bandage and needle will need to be changed every week, or as directed by your health care provider.  Keep the bandage covering the needle clean and dry. Do not get it wet. Follow your health care provider's instructions on how to take a shower or bath while the port is accessed.  If your port does not need to stay accessed, no bandage is needed over the port.  What is flushing? Flushing helps keep the port from getting clogged. Follow your health care provider's instructions on how and when to flush the port. Ports are usually flushed with saline solution or a medicine called heparin. The need for flushing will depend on how the port is used.  If the port is used for intermittent medicines or blood draws, the port will need to be flushed: ? After medicines have been given. ? After blood has been drawn. ? As part of routine maintenance.  If a constant infusion is running, the port may not need to be flushed.  How long will my port stay implanted? The port can stay in for as long as your health care provider thinks it is needed. When it is time for the port to come out, surgery will be   done to remove it. The procedure is similar to the one performed when the port was put in. When should I seek immediate medical care? When you have an implanted port, you should seek immediate medical care if:  You notice a bad smell coming from the incision site.  You have swelling, redness, or drainage at the incision site.  You have more swelling or pain at the port site or the surrounding area.  You have a fever that is not controlled with medicine.  This information is not intended to replace advice given to you by your health care provider. Make sure you discuss any questions you have with your health care provider. Document  Released: 12/18/2004 Document Revised: 05/26/2015 Document Reviewed: 08/25/2012 Elsevier Interactive Patient Education  2017 Elsevier Inc. Influenza Virus Vaccine (Flucelvax) What is this medicine? INFLUENZA VIRUS VACCINE (in floo EN zuh VAHY ruhs vak SEEN) helps to reduce the risk of getting influenza also known as the flu. The vaccine only helps protect you against some strains of the flu. This medicine may be used for other purposes; ask your health care provider or pharmacist if you have questions. COMMON BRAND NAME(S): FLUCELVAX What should I tell my health care provider before I take this medicine? They need to know if you have any of these conditions: -bleeding disorder like hemophilia -fever or infection -Guillain-Barre syndrome or other neurological problems -immune system problems -infection with the human immunodeficiency virus (HIV) or AIDS -low blood platelet counts -multiple sclerosis -an unusual or allergic reaction to influenza virus vaccine, other medicines, foods, dyes or preservatives -pregnant or trying to get pregnant -breast-feeding How should I use this medicine? This vaccine is for injection into a muscle. It is given by a health care professional. A copy of Vaccine Information Statements will be given before each vaccination. Read this sheet carefully each time. The sheet may change frequently. Talk to your pediatrician regarding the use of this medicine in children. Special care may be needed. Overdosage: If you think you've taken too much of this medicine contact a poison control center or emergency room at once. Overdosage: If you think you have taken too much of this medicine contact a poison control center or emergency room at once. NOTE: This medicine is only for you. Do not share this medicine with others. What if I miss a dose? This does not apply. What may interact with this medicine? -chemotherapy or radiation therapy -medicines that lower your immune  system like etanercept, anakinra, infliximab, and adalimumab -medicines that treat or prevent blood clots like warfarin -phenytoin -steroid medicines like prednisone or cortisone -theophylline -vaccines This list may not describe all possible interactions. Give your health care provider a list of all the medicines, herbs, non-prescription drugs, or dietary supplements you use. Also tell them if you smoke, drink alcohol, or use illegal drugs. Some items may interact with your medicine. What should I watch for while using this medicine? Report any side effects that do not go away within 3 days to your doctor or health care professional. Call your health care provider if any unusual symptoms occur within 6 weeks of receiving this vaccine. You may still catch the flu, but the illness is not usually as bad. You cannot get the flu from the vaccine. The vaccine will not protect against colds or other illnesses that may cause fever. The vaccine is needed every year. What side effects may I notice from receiving this medicine? Side effects that you should report to your doctor   or health care professional as soon as possible: -allergic reactions like skin rash, itching or hives, swelling of the face, lips, or tongue Side effects that usually do not require medical attention (Report these to your doctor or health care professional if they continue or are bothersome.): -fever -headache -muscle aches and pains -pain, tenderness, redness, or swelling at the injection site -tiredness This list may not describe all possible side effects. Call your doctor for medical advice about side effects. You may report side effects to FDA at 1-800-FDA-1088. Where should I keep my medicine? The vaccine will be given by a health care professional in a clinic, pharmacy, doctor's office, or other health care setting. You will not be given vaccine doses to store at home. NOTE: This sheet is a summary. It may not cover all  possible information. If you have questions about this medicine, talk to your doctor, pharmacist, or health care provider.  2018 Elsevier/Gold Standard (2010-11-29 14:06:47)  

## 2016-10-11 NOTE — Progress Notes (Signed)
Oncology Nurse Navigator Documentation  Met patient and wife [facility] upon arrival for PAC flush.  Per wife's request, provided documentation for dates of chemotherapy, radiation and June hospitalization. Made copy of Living Will and HCPOA, delivered to Radiation Oncology Business Office for scanning.  Rick , RN, BSN, CHPN Head & Neck Oncology Nurse Navigator [facility] [place] [phone] 

## 2016-10-12 ENCOUNTER — Telehealth: Payer: Self-pay | Admitting: *Deleted

## 2016-10-12 NOTE — Telephone Encounter (Signed)
-----   Message from Andres Bears, MD sent at 10/12/2016 11:54 AM EDT ----- Regarding: Appt [name] called to say that he has been having some right upper quadrant pain May need to be worked in to see me or advanced practitioner next week Please call to see how he is doing Thanks [employer]

## 2016-10-12 NOTE — Telephone Encounter (Signed)
Left voicemail for patient to call back. 

## 2016-10-15 ENCOUNTER — Other Ambulatory Visit: Payer: Self-pay | Admitting: Hematology and Oncology

## 2016-10-15 ENCOUNTER — Other Ambulatory Visit: Payer: Self-pay | Admitting: Internal Medicine

## 2016-10-15 NOTE — Telephone Encounter (Signed)
I have spoken to patient and have scheduled him to see Tye Savoy, NP on 10/16/16 at 230 pm. Patient states pain starts under right ribcage and and then radiates to left side and goes up to the left side of neck. States pain is intermittent. Has been told he has gallstones in the past. Patient states he is admittedly nervous about this with his history of cancer etc.

## 2016-10-15 NOTE — Telephone Encounter (Signed)
Left voicemail for patient to call back. 

## 2016-10-16 ENCOUNTER — Ambulatory Visit (INDEPENDENT_AMBULATORY_CARE_PROVIDER_SITE_OTHER)
Admission: RE | Admit: 2016-10-16 | Discharge: 2016-10-16 | Disposition: A | Discharge status: Home / Self Care | Payer: PPO | Source: Ambulatory Visit | Attending: Nurse Practitioner | Admitting: Nurse Practitioner

## 2016-10-16 ENCOUNTER — Ambulatory Visit (INDEPENDENT_AMBULATORY_CARE_PROVIDER_SITE_OTHER): Payer: PPO | Admitting: Nurse Practitioner

## 2016-10-16 ENCOUNTER — Encounter: Payer: Self-pay | Admitting: Nurse Practitioner

## 2016-10-16 VITALS — BP 100/48 | HR 76 | Ht 68.0 in | Wt 160.6 lb

## 2016-10-16 DIAGNOSIS — R0781 Pleurodynia: Secondary | ICD-10-CM

## 2016-10-16 DIAGNOSIS — Z8781 Personal history of (healed) traumatic fracture: Secondary | ICD-10-CM

## 2016-10-16 NOTE — Patient Instructions (Signed)
If you are age 77 or older, your body mass index should be between 23-30. Your Body mass index is 24.42 kg/m. If this is out of the aforementioned range listed, please consider follow up with your Primary Care Provider.  If you are age 56 or younger, your body mass index should be between 19-25. Your Body mass index is 24.42 kg/m. If this is out of the aformentioned range listed, please consider follow up with your Primary Care Provider.   Your provider has requested that you have an x ray before leaving today. Please go to the basement floor to our Radiology department for the test.  We will call you with results.  Thank you for choosing me and [facility].   Tye Savoy, NP

## 2016-10-16 NOTE — Progress Notes (Addendum)
Chief Complaint:   RUQ pain  HPI: Patient is a 76 year old male known to Dr. Hilarie Fredrickson. He has a long-standing history of pan ulcerative colitis, ankylosing spondylitis, maintained on balsalazide 2.25 g TID . 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 He is status post chemoradiation. Last visit here in late July . He was having loose stools but this was felt to be secondary to enteral feedings not ulcerative colitis. He complained of odynophagia which we felt be radiation induced esophagitis. Patient had been off of his 6-MP due to swallowing problems. Because of concern for leukopenia in this patient undergoing chemotherapy patient's 6-MP had been on hold. We wanted to confer with Oncolologist about resuming the 27mp.   No signs of local recurrence of buccal cancer but CT chest 09/27/16 shows enlarging lung nodule suspicious for metastatic disease. Location of the lung nodules are difficult for biopsy. Patient's weight is down 17 pounds since late August. Patient is a friend of Dr. [name]. Apparently Dr. [name] called our office to inform us the patient had been having some right upper quadrant pain. LFTs normal. Recent chest CT revealed cholelithiasis. His LFTs are normal.  He apparently fell in late Aug sustained a right 9th rib fx. He had some pain in left rib cage in early Sept and found to have left rib fracture as well. He had similar right sided pain following rib fracture but then pain got much better. Began to have recurrent pain in the last several days. It is affecting his sleep. No recent falls. He was pulling on a generator recently. Has been to gym but not worked out upper body.   His right sided pain is related to movement. It is intermittent but hurts to take a deep breath. He hasn't tried taking any anti-inflammatories or other treatment for the pain. No SOB. The pain is unrelated to eating. No  nausea. His BMs are at baseline and feels UC is well managed at this point. His appetite is poor, supplementing with enteral feedings.  Past Medical History:  Diagnosis Date  . Ankylosing spondylitis ([place])   . Blood transfusion    hx of transfusion without reaction  . Bradycardia   . Cancer ([place]) 03/2016   oral  . Cholelithiasis   . Coronary artery disease    a. s/p CABG x 6 in 1997 (LIMA->LAD, VG->RI ->OM1->OM2, VG->PDA->PLV // b. 05/2011 Cath:  patent grafs, native prox rca and d2 dzs  ->med rx. // c. Myoview 1/18: not gated, large inferolateral scar, no ischemia; Intermediate Risk (IL scar old - on prior studies >> med rx)  . Crohn's colitis ([place]) 12/01/1997  . Diabetes mellitus   . Dysphagia   . Fatty liver   . GERD (gastroesophageal reflux disease)   . Heart block   . Hiatal hernia 12/03/2008  . History of radiation therapy 05/23/16- 07/09/16   Left Cheek/ 60 Gy in 30 fractions to gross disease, 63 Gy in 35 fractions to high risk nodal echelons, and 56 Gy in 35 fractions to intermedicate risk nodal echelons.   . History of skin cancer   . Hx of echocardiogram    Echo 5/14:  Mild LVH, EF 55-60%, NL diast fxn, mild LAE   . Hyperlipidemia   . Hypertension   . Kidney stones   . Neck pain   . Pneumonia    hx of PNA  . Squamous cell cancer of buccal mucosa (  [place])   . 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 No  obvious masses, no hepatomegaly,  normal bowel sounds SKIN:  turgor, no lesions seen Musculoskeletal:  Area of localized tenderness over right rib cage NEURO: Alert and oriented x 3, no focal neurologic deficits  ASSESSMENT and PLAN:   Pleasant 76year old male with right rib cage pain related to movements.  -Going to repeat his right rib xray today to see if the fracture from late August has changed or if new fx or even any bone mets.  -will call him with results.   Tye Savoy , NP 10/16/2016, 2:49 PM   Addendum: Reviewed and agree with management. On review of xray there is a lytic bone lesion and this is very likely the source of pain on that side.  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 We have alerted Dr. Alvy Bimler and the patient will followup there this week. Tramadol has been rx'ed for pain by oncology. Pyrtle, Lajuan Lines, MD

## 2016-10-17 ENCOUNTER — Telehealth: Payer: Self-pay | Admitting: Nurse Practitioner

## 2016-10-17 ENCOUNTER — Telehealth: Payer: Self-pay | Admitting: *Deleted

## 2016-10-17 ENCOUNTER — Telehealth: Payer: Self-pay | Admitting: Hematology and Oncology

## 2016-10-17 NOTE — Telephone Encounter (Signed)
Scheduled appt per Ernestene Kiel - patient is aware of appt date and time.

## 2016-10-17 NOTE — Telephone Encounter (Signed)
Contacted by the provider

## 2016-10-17 NOTE — Telephone Encounter (Signed)
"  Tye Savoy calling to speak with on oncologist about this mutual patient I saw yesterday." Offered voicemail. "Cannot leave voicemail or go home knowing what I found yesterday and learned today.  Patient needs F/U for pain, in severe pain this morning, called EMS after he almost passed out.  I saw him yesterday with a c/o abdominal pain but Xray reveals an oncologic problem.  Took a pain medicine reportedly prescribed by Dr.Gorsuch I do not know if it's expired." This nurse can and will notify on-call provider if provider out of office at this time.  Called and reached patient.  "I'm okay.  Had a problem this morning.  Called EMS in severe pain, sweating, B/P dropped to 80/? something.  I took one Hydrocodone 5-325 mg tablet between 10:00 and 11:00 am which seems to have worked.  I have two pills left of what Dr. Benson Norway DDS ordered on 02-29-2016 after the biopsy in my mouth.  How do I get refills on this.  I was not given xray results but EMS believes I aggravated old rib fractures after working so hard with our power loss.  Had to clean up a tree that fell and start the generator."  No radiology results provided with this call.  Encouraged not to push, pull or lift anything but do normal day to day activity.  Use medicine on hand if needed.  This nurse will provide information to provider.  Expect a call tomorrow with further instructions, orders or refill information.  Hydrocodone prescriptions must be picked up, THEN taken to phramacy to fill.  Next scheduled F/U is 11-29-2016 after seen on 10+10-2016.     Routing call information to collaborative nurse and provider for review.  Further patient communication through collaborative nurse.  External radiology report printed for review.

## 2016-10-18 ENCOUNTER — Telehealth: Payer: Self-pay | Admitting: Hematology and Oncology

## 2016-10-18 ENCOUNTER — Other Ambulatory Visit: Payer: Self-pay

## 2016-10-18 DIAGNOSIS — R079 Chest pain, unspecified: Secondary | ICD-10-CM | POA: Diagnosis not present

## 2016-10-18 MED ORDER — TRAMADOL HCL 50 MG PO TABS: 50.0000 mg | ORAL_TABLET | Freq: Four times a day (QID) | ORAL | 0 refills | Status: DC | PRN

## 2016-10-18 NOTE — Telephone Encounter (Signed)
Called and left message with wife. Ask them to call nurse back. Left message asking if patient could check in tomorrow at  1 pm and see Dr. Alvy Bimler at [facility]

## 2016-10-18 NOTE — Telephone Encounter (Signed)
Wife called back , unable make appt tomorrow. Will be available next week. Instructed to take tylenol extra strength for pain as needed.

## 2016-10-18 NOTE — Telephone Encounter (Signed)
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

## 2016-10-18 NOTE — Telephone Encounter (Signed)
Called back, will able to come Monday at 1200, 10-22, scheduling message sent. Rx for Tramadol called in pharmacy.

## 2016-10-18 NOTE — Telephone Encounter (Signed)
Spoke with patient regarding appt that was scheduled per 10/18 sch msg.

## 2016-10-22 ENCOUNTER — Ambulatory Visit (HOSPITAL_BASED_OUTPATIENT_CLINIC_OR_DEPARTMENT_OTHER): Payer: PPO | Admitting: Hematology and Oncology

## 2016-10-22 DIAGNOSIS — Z5189 Encounter for other specified aftercare: Secondary | ICD-10-CM

## 2016-10-22 DIAGNOSIS — G893 Neoplasm related pain (acute) (chronic): Secondary | ICD-10-CM | POA: Diagnosis not present

## 2016-10-22 DIAGNOSIS — K6389 Other specified diseases of intestine: Secondary | ICD-10-CM

## 2016-10-22 DIAGNOSIS — C060 Malignant neoplasm of cheek mucosa: Secondary | ICD-10-CM

## 2016-10-22 DIAGNOSIS — Z7189 Other specified counseling: Secondary | ICD-10-CM

## 2016-10-23 ENCOUNTER — Encounter: Payer: Self-pay | Admitting: Hematology and Oncology

## 2016-10-23 ENCOUNTER — Telehealth: Payer: Self-pay

## 2016-10-23 DIAGNOSIS — G893 Neoplasm related pain (acute) (chronic): Secondary | ICD-10-CM | POA: Insufficient documentation

## 2016-10-23 NOTE — Assessment & Plan Note (Signed)
The patient has intermittent rib pain I review his recent chest x-ray and his last CT scan dated 09/27/2016 Indeed, it appeared he may have a rib lesion that was missed on the original report He has intermittent symptomatic pain, typically positional in nature Low-dose tramadol appears to control his pain well We also discussed the role of palliative radiation therapy For now, we will continue tramadol as needed for pain

## 2016-10-23 NOTE — Telephone Encounter (Signed)
I sent scheduling msg for next week 

## 2016-10-23 NOTE — Progress Notes (Signed)
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 Tumor measures 0.9 cm in greatest dimension. Tumor extends to the inked deep tissue edge. Depth of invasion: 4 mm (in this material) See note.  Note: an immunostains for P16 was performed at an outside hospital and provided for [place] to review; p16 immunostain is negative. Per outside report, In situ hybridization for high-risk HPV types showed no evidence of transcriptionally active high-risk HPV types.      03/21/2016 Initial Diagnosis    Salient findings:  -EAC's clear -Lesion within left buccal mucosa (pictured below) with about 29mm depth. Width is about 2/3 of the left buccal mucosal surface area by palpation. Extends to the oral commissure.  -Tongue soft and mobile without mucosal lesions -Neck soft with no palpable neck masses -Right handed -Right hand is pink and warm with Allen's test negative (normal)  -Left forearm very sun damaged with multiple treated areas from precancerous or cancerous areas. Right forearm not as dramatic, presumably from sun exposure while driving      4/85/4627 Surgery    RESECTION OF ORAL BUCCAL MUCOSA SELECTIVE NECK DISSECTION FREE FLAP RADIAL FOREARM PR SPLIT [place] <100 SQCM  [15100] (SPLIT THICKNESS SKIN GRAFT LEG        04/13/2016 Pathology Results    A.LEFT ORAL COMMISSURE INFERIOR, EXCISION: No malignancy identified.  B.LEFT ORAL COMMISSURE SUPERIOR, EXCISION: No malignancy identified.  C.LEFT DEEP ORAL COMMISSURE, EXCISION: No malignancy identified.  D.RIGHT POSTERIOR BUCCAL INFERIOR, EXCISION: No malignancy identified.  E.RIGHT POSTERIOR BUCCAL SUPERIOR, EXCISION: No malignancy identified.  F.ORAL CAVITY, LEFT BUCCAL, EXCISION:  Invasive squamous cell carcinoma, moderately differentiated,  keratinizing. Tumor size:2.2 cm. Tumor depth of invasion:0.8 cm. Perineural invasion identified. Margins negative for malignancy. Pathologic stage:pT2 pN2a. See tumor protocol summary below.  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 Tumor deposit size:1.1 cm. Extranodal extension present. Benign salivary gland tissue.  SURGICAL PATHOLOGY CANCER CASE SUMMARY (AJCC 8TH EDITION) LIP AND ORAL CAVITY CAP Protocol posting date: June, 2017 Version: LipOralCavity 4.0.0.0  PROCEDURE: Wide resection of buccal mucosa TUMOR SITE:Oral:Buccal mucosa TUMOR LATERALITY: Left TUMOR FOCALITY: Unifocal TUMOR SIZE:Greatest dimension: 2.2 cm TUMOR DEPTH OF INVASION (DOI):8 mm HISTOLOGIC TYPE:Squamous cell carcinoma, conventional HISTOLOGIC GRADE:G2: Moderately differentiated SPECIMEN MARGINS:Uninvolved by invasive carcinoma  DISTANCE FROM CLOSEST MARGIN:1 mm  SPECIFY MARGIN(S):Deep LYMPHOVASCULAR INVASION:Present PERINEURAL INVASION: Present REGIONAL LYMPH NODES: NUMBER OF LYMPH NODES INVOLVED: 1 NUMBER OF LYMPH NODES EXAMINED: 4 LATERALITY OF LYMPH NODES INVOLVED: Ipsilateral SIZE OF LARGEST METASTATIC DEPOSIT:1.1 cm EXTRANODAL  EXTENSION:Present PATHOLOGIC STAGE CLASSIFICATION (pTNM, AJCC 8th Ed): pT2 pN2a pT2: umor >2 cm but <=4 cm and <=10 mm DOI pN2a:Metastasis in a single ipsilateral lymph node, 3 cm or  smaller in greatest dimension and ENE(+) BIOMARKERS:performed on prior outside biopsy [facility] review case [phone], outside case [id number]) P16 (Immunohistochemistry):Negative HPV (high-risk types by in situ hybridization):Reported as negative  05/11/2016 Imaging    1. 4 mm left lower lobe pulmonary nodule. Attention on follow-up imaging recommended as metastatic disease not excluded. 2. 2.8 cm left thyroid nodule. Thyroid ultrasound recommended to further evaluate. 3. Bilateral renal cysts. 4. Coronary artery and thoracoabdominal aortic atherosclerosis. 5. Ankylosing spondylitis.        05/11/2016 Imaging    CT neck: Surgical clips in the region of the left buccal mucosa, presumably at the previous primary site. Mild scarring in that region measuring about 8 mm. Cannot assess for residual or recurrent disease in that location, but this may simply be scarring. Previous left submandibular resection and left neck node dissection. No abnormal nodes presently. Multinodular goiter. Ankylosing spondylitis.  C1-2 remains a mobile segment.      05/21/2016 Procedure    1. Successful placement of a right internal jugular approach power injectable Port-A-Cath. The Port a catheter is ready for immediate use. 2. Successful fluoroscopic insertion of a 20-French pull-through gastrostomy tube. The gastrostomy may be used immediately for medication administration and may be utilized in 24 hrs for the initiation of feeds.      05/23/2016 - 07/09/2016 Radiation Therapy    Site/dose:     Left Cheek and bilateral neck / 60 Gy in 30 fractions to gross disease, 54 Gy in 30 fractions to intermediate risk nodal echelons Beams/energy:   IMRT / 6 MV photons       05/25/2016 - 06/15/2016 Chemotherapy    He  received weekly reduced dose cisplatin       06/21/2016 Adverse Reaction    Cycle 5 is delayed due to pancytopenia      06/26/2016 - 06/29/2016 Hospital Admission    He was admitted for management of neutropenic fever      08/27/2016 Imaging    1. Negative for pneumothorax or pleural effusion 2. Acute right eighth rib fracture. Questionable fracture deformity of the right ninth rib.      09/04/2016 Imaging    Left-sided rib fractures, without pneumothorax or pleural fluid      09/27/2016 Imaging    CT chest 1. Multiple (at least 6) scattered solid pulmonary nodules throughout both lungs, all new since 05/11/2016 chest CT and increased since 08/27/2016 chest CT, compatible with growing pulmonary metastases. 2. No thoracic adenopathy. 3. Additional findings include stable multinodular goiter and cholelithiasis.  Aortic Atherosclerosis (ICD10-I70.0).      09/27/2016 Imaging    1. Unchanged appearance of the left buccal mucosa resection site without evidence of disease recurrence or progression. 2. Unchanged cervical ankylosis. 3. Mild carotid and aortic atherosclerosis (ICD10-I70.0).      10/16/2016 Imaging    No evidence of active pulmonary disease.  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        INTERVAL HISTORY: Please see below for problem oriented charting. He returns with his wife for further follow-up He has intermittent right rib pain Last week, with tramadol as needed, his pain subsided It is positional related His appetite remained poor but he has not lost any more weight He denies cough, shortness of breath   REVIEW OF SYSTEMS:   Constitutional: Denies fevers, chills or abnormal weight loss Eyes: Denies blurriness of vision Ears, nose, mouth, throat, and face: Denies mucositis or sore throat Respiratory: Denies cough, dyspnea or wheezes Gastrointestinal:  Denies nausea, heartburn or change in bowel  habits Skin: Denies abnormal skin rashes Lymphatics: Denies new lymphadenopathy or easy bruising Neurological:Denies numbness, tingling or new  weaknesses Behavioral/Psych: Mood is stable, no new changes  All other systems were reviewed with the patient and are negative.  I have reviewed the past medical history, past surgical history, social history and family history with the patient and they are unchanged from previous note.  ALLERGIES:  has No Known Allergies.  MEDICATIONS:  Current Outpatient Prescriptions  Medication Sig Dispense Refill  . aspirin EC 81 MG tablet Take 81 mg by mouth daily.    Marland Kitchen atorvastatin (LIPITOR) 80 MG tablet Take 80 mg by mouth at bedtime.    . balsalazide (COLAZAL) 750 MG capsule Take 3 capsules (2,250 mg total) by mouth 3 (three) times daily. 270 capsule 3  . cyanocobalamin (,VITAMIN B-12,) 1000 MCG/ML injection INJECT 1ML ONCE MONTHLY. 3 mL 1  . isosorbide mononitrate (ISMO,MONOKET) 20 MG tablet Crush 2 tablets (40 mg total) by mouth 2 (two) times daily at 10 AM and 5 PM. 120 tablet 11  . magic mouthwash w/lidocaine SOLN Take 10 mLs by mouth 4 (four) times daily as needed for mouth pain. 240 mL 0  . mercaptopurine (PURINETHOL) 50 MG tablet TAKE ONE TABLET ONCE A DAY ON AN EMPTY STOMACH 1 HR BEFORE OR 2HRS AFTER A MEAL 30 tablet 2  . metFORMIN (GLUCOPHAGE) 500 MG tablet Place 1 tablet (500 mg total) into feeding tube 2 (two) times daily with a meal. 180 tablet 1  . naproxen sodium (ANAPROX) 220 MG tablet Take 220 mg by mouth 2 (two) times daily as needed.     . nitroGLYCERIN (NITROSTAT) 0.4 MG SL tablet Place 1 tablet (0.4 mg total) under the tongue every 5 (five) minutes as needed for chest pain. Max 3 doses. In no response call 911. 25 tablet 5  . Nutritional Supplements (FEEDING SUPPLEMENT, OSMOLITE 1.5 CAL,) LIQD Give 1 and 1/2 cans Osmolite 1.5 via PEG 4 times daily with 60 mL free water before and after bolus feedings. In addition, drink or flush PEG with  240 mL free water TID. Send formula and supplies. 6 Bottle 0  . polyethylene glycol (MIRALAX / GLYCOLAX) packet Take 17 g by mouth daily as needed for moderate constipation.    . predniSONE (DELTASONE) 5 MG tablet TAKE 2 TABLETS IN THE MORNING WITH BREAKFAST. 60 tablet 0  . traMADol (ULTRAM) 50 MG tablet Take 1 tablet (50 mg total) by mouth every 6 (six) hours as needed for moderate pain. 30 tablet 0   No current facility-administered medications for this visit.     PHYSICAL EXAMINATION: ECOG PERFORMANCE STATUS: 1 - Symptomatic but completely ambulatory  Vitals:   10/22/16 1202  BP: (!) 138/99  Pulse: 61  Resp: 18  SpO2: 98%   Filed Weights   10/22/16 1202  Weight: 160 lb 14.4 oz (73 kg)    GENERAL:alert, no distress and comfortable SKIN: skin color, texture, turgor are normal, no rashes or significant lesions EYES: normal, Conjunctiva are pink and non-injected, sclera clear Musculoskeletal:no cyanosis of digits and no clubbing  NEURO: alert & oriented x 3 with fluent speech, no focal motor/sensory deficits  LABORATORY DATA:  I have reviewed the data as listed    Component Value Date/Time   NA 137 09/24/2016 1523   NA 141 07/24/2016 1347   K 4.3 09/24/2016 1523   K 5.3 (H) 07/24/2016 1347   CL 99 09/24/2016 1523   CO2 30 09/24/2016 1523   CO2 30 (H) 07/24/2016 1347   GLUCOSE 147 (H) 09/24/2016 1523   GLUCOSE 173 (H) 07/24/2016 1347  GLUCOSE 98 12/12/2005 0729   BUN 18 09/24/2016 1523   BUN 23.0 07/24/2016 1347   CREATININE 1.01 09/24/2016 1523   CREATININE 1.0 07/24/2016 1347   CALCIUM 9.5 09/24/2016 1523   CALCIUM 9.5 07/24/2016 1347   PROT 7.0 09/24/2016 1523   PROT 6.6 07/24/2016 1347   ALBUMIN 4.0 09/24/2016 1523   ALBUMIN 3.5 07/24/2016 1347   AST 16 09/24/2016 1523   AST 17 07/24/2016 1347   ALT 8 09/24/2016 1523   ALT 7 07/24/2016 1347   ALKPHOS 88 09/24/2016 1523   ALKPHOS 68 07/24/2016 1347   BILITOT 0.7 09/24/2016 1523   BILITOT 0.48 07/24/2016  1347   GFRNONAA >60 08/27/2016 1614   GFRAA >60 08/27/2016 1614    No results found for: SPEP, UPEP  Lab Results  Component Value Date   WBC 6.1 09/24/2016   NEUTROABS 5.4 09/24/2016   HGB 12.1 (L) 09/24/2016   HCT 36.9 (L) 09/24/2016   MCV 97.5 09/24/2016   PLT 209.0 09/24/2016      Chemistry      Component Value Date/Time   NA 137 09/24/2016 1523   NA 141 07/24/2016 1347   K 4.3 09/24/2016 1523   K 5.3 (H) 07/24/2016 1347   CL 99 09/24/2016 1523   CO2 30 09/24/2016 1523   CO2 30 (H) 07/24/2016 1347   BUN 18 09/24/2016 1523   BUN 23.0 07/24/2016 1347   CREATININE 1.01 09/24/2016 1523   CREATININE 1.0 07/24/2016 1347      Component Value Date/Time   CALCIUM 9.5 09/24/2016 1523   CALCIUM 9.5 07/24/2016 1347   ALKPHOS 88 09/24/2016 1523   ALKPHOS 68 07/24/2016 1347   AST 16 09/24/2016 1523   AST 17 07/24/2016 1347   ALT 8 09/24/2016 1523   ALT 7 07/24/2016 1347   BILITOT 0.7 09/24/2016 1523   BILITOT 0.48 07/24/2016 1347       RADIOGRAPHIC STUDIES: I have reviewed multiple imaging studies with the patient and his wife I have personally reviewed the radiological images as listed and agreed with the findings in the report. Dg Ribs Unilateral W/chest Right  Result Date: 10/16/2016 CLINICAL DATA:  Anterior right mid rib pain for 1 month. Previous history of right rib fractures 2 months ago. No new injury. EXAM: RIGHT RIBS AND CHEST - 3+ VIEW COMPARISON:  09/04/2016 FINDINGS: Postoperative changes in the mediastinum. Power port type central venous catheter with tip over the cavoatrial junction region. No pneumothorax. No airspace disease or consolidation in the lungs. No blunting of costophrenic angles. Tortuous aorta. Degenerative changes in the shoulders. Old left rib fractures. Gastrostomy tube in the left upper quadrant. There is a loss of bone and cortex demonstrated along the anterior aspect of the right anterior ninth ribs. This is worrisome for lytic bone  metastasis. No additional bone lesions are identified. No acute displaced fractures are visualized. IMPRESSION: No evidence of active pulmonary disease. 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 Electronically Signed   By: Lucienne Capers M.D.   On: 10/16/2016 21:23   Ct Soft Tissue Neck W Contrast  Result Date: 09/27/2016 CLINICAL DATA:  Status post surgical resection of left buccal mucosa squamous cell carcinoma. Radiation and chemotherapy completed July 2018. No current complaints. EXAM: CT NECK WITH CONTRAST TECHNIQUE: Multidetector CT imaging of the neck was performed using the standard protocol following the bolus administration of intravenous contrast. CONTRAST:  20mL ISOVUE-300 IOPAMIDOL (ISOVUE-300) INJECTION 61% COMPARISON:  CT neck  05/11/2016 FINDINGS: Pharynx and larynx: --Nasopharynx: Fossae of Rosenmuller are clear. Normal adenoid tonsils for age. --Oral cavity and oropharynx: There are surgical clips in unchanged position at the left buccal mucosa. The surrounding tissues are unchanged. Mild soft tissue prominence anteriorly again seen. The remainder of the oral cavity is normal. The oropharynx is normal. --Hypopharynx: Normal vallecula and pyriform sinuses. --Larynx: Normal epiglottis and pre-epiglottic space. Normal aryepiglottic and vocal folds. --Retropharyngeal space: No abscess, effusion or lymphadenopathy. Salivary glands: --Parotid: No mass lesion or inflammation. No sialolithiasis or ductal dilatation. --Submandibular: Symmetric without inflammation. No sialolithiasis or ductal dilatation. --Sublingual: Normal. No ranula or other visible lesion of the base of tongue and floor of mouth. Thyroid: Unchanged appearance of left thyroid lobe nodule. This measures 1.9 cm. Lymph nodes: No enlarged or abnormal density lymph nodes. Vascular: Right chest wall Port-A-Cath. Mild aortic calcific atherosclerosis. Left carotid bifurcation  atherosclerosis without hemodynamically significant stenosis. Limited intracranial: Normal. Visualized orbits: Normal. Mastoids and visualized paranasal sinuses: Clear. Skeleton: Unchanged cervical ankylosis and C1-C2 degenerative change. Upper chest: Please see dedicated report of concomitant chest CT. Other: None. IMPRESSION: 1. Unchanged appearance of the left buccal mucosa resection site without evidence of disease recurrence or progression. 2. Unchanged cervical ankylosis. 3. Mild carotid and aortic atherosclerosis (ICD10-I70.0). Electronically Signed   By: Ulyses Jarred M.D.   On: 09/27/2016 14:16   Ct Chest W Contrast  Result Date: 09/27/2016 CLINICAL DATA:  Buccal mucosa squamous cell carcinoma diagnosed December 2017 status post chemotherapy and radiation therapy completed July 2018. Restaging. EXAM: CT CHEST WITH CONTRAST TECHNIQUE: Multidetector CT imaging of the chest was performed during intravenous contrast administration. CONTRAST:  72mL ISOVUE-300 IOPAMIDOL (ISOVUE-300) INJECTION 61% COMPARISON:  09/04/2016 chest radiograph. 08/27/2016 chest CT angiogram. 05/11/2016 chest CT. FINDINGS: Cardiovascular: Normal heart size. No significant pericardial fluid/thickening. Left main, left anterior descending, left circumflex and right coronary atherosclerosis status post CABG. Right internal jugular MediPort terminates at the cavoatrial junction. Atherosclerotic nonaneurysmal thoracic aorta. Normal caliber pulmonary arteries. No central pulmonary emboli. Mediastinum/Nodes: Stable multinodular goiter with dominant 2.4 cm hypodense left thyroid lobe nodule. Unremarkable esophagus. No pathologically enlarged axillary, mediastinal or hilar lymph nodes. Lungs/Pleura: No pneumothorax. No pleural effusion. No acute consolidative airspace disease or lung masses. Multiple (at least 6) scattered solid pulmonary nodules throughout both lungs are new since 05/11/2016 chest CT and increased since 08/27/2016 chest CT,  for example a 1.0 cm right middle lobe pulmonary nodule (series 6/ image 83), increased from 0.4 cm on 08/27/2016, and a 1.1 cm central left lower lobe nodule (series 6/ image 92), increased from 0.5 cm on 08/27/2016. Left lower lobe 0.5 cm solid pulmonary nodule (series 6/image 95) is stable since 05/11/2016. Upper abdomen: Cholelithiasis. Percutaneous gastrostomy tube is in place in the body of the stomach. Musculoskeletal: No aggressive appearing focal osseous lesions. Intact sternotomy wires. Marked thoracic spondylosis. IMPRESSION: 1. Multiple (at least 6) scattered solid pulmonary nodules throughout both lungs, all new since 05/11/2016 chest CT and increased since 08/27/2016 chest CT, compatible with growing pulmonary metastases. 2. No thoracic adenopathy. 3. Additional findings include stable multinodular goiter and cholelithiasis. Aortic Atherosclerosis (ICD10-I70.0). Electronically Signed   By: Ilona Sorrel M.D.   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 Ultimately, he is undecided He will go home and think about it and will call me tomorrow  Cancer associated pain The patient has intermittent rib pain I review his recent chest x-ray and his last CT scan dated 09/27/2016 Indeed, it appeared he may have a rib lesion that was missed on the original report He has intermittent symptomatic pain, typically positional in nature Low-dose tramadol appears to control his pain well We also discussed the role of  palliative radiation therapy For now, we will continue tramadol as needed for pain  Goals of care, counseling/discussion The patient is aware he has incurable disease and treatment is strictly palliative. We discussed importance of Advanced Directives and Living will. We discussed CODE STATUS; the patient is undecided We discussed goals of care of treatment I also evaluated the patient's overall expectation; the patient shared with me that he desire to live up to 74 if possible We discussed the overall life expectancy of men in [place] is typically around 77 years old With stage IV cancer, average overall survival is a year and a half with aggressive chemotherapy I addressed all his questions   No orders of the defined types were placed in this encounter.  All questions were answered. The patient knows to call the clinic with any problems, questions or concerns. No barriers to learning was detected. I spent 40 minutes counseling the patient face to face. The total time spent in the appointment was 60 minutes and more than 50% was on counseling and review of test results     [name], MD 10/23/2016 7:44 AM

## 2016-10-23 NOTE — Assessment & Plan Note (Signed)
The patient is aware he has incurable disease and treatment is strictly palliative. We discussed importance of Advanced Directives and Living will. We discussed CODE STATUS; the patient is undecided We discussed goals of care of treatment I also evaluated the patient's overall expectation; the patient shared with me that he desire to live up to 46 if possible We discussed the overall life expectancy of men in [place] is typically around 77 years old With stage IV cancer, average overall survival is a year and a half with aggressive chemotherapy I addressed all his questions

## 2016-10-23 NOTE — Telephone Encounter (Signed)
Wife called requesting appt with Dr. Alvy Bimler. He is still trying to decide and needs just 10 minutes.

## 2016-10-23 NOTE — Assessment & Plan Note (Addendum)
Unfortunately, the patient has symptomatic metastatic disease We discussed treatment options Given his chronic inflammatory bowel disease, immunotherapy is not not a good option I doubt the patient can tolerate triple combination chemotherapy We discussed the risk and benefits of single agent therapy versus dual agent therapy We discussed common side effects to be expected including risk of pancytopenia, infection, peripheral neuropathy, etc. Ultimately, he is undecided He will go home and think about it and will call me tomorrow

## 2016-10-24 ENCOUNTER — Other Ambulatory Visit: Payer: Self-pay | Admitting: Internal Medicine

## 2016-10-24 ENCOUNTER — Telehealth: Payer: Self-pay | Admitting: Hematology and Oncology

## 2016-10-24 NOTE — Telephone Encounter (Signed)
Scheduled appt per 10/23 sch message - patient is aware of appt date and time   

## 2016-10-29 ENCOUNTER — Telehealth: Payer: Self-pay | Admitting: *Deleted

## 2016-10-29 NOTE — Telephone Encounter (Signed)
Oncology Nurse Navigator Documentation  Per patient wife request, provided date-specific documentation for chemotherapy and radiation treatments needed for [facility] insurance claim, including copy for patient records.  LVMM on home and mobile phones indicating information available for pick-up at [facility] reception.  Gayleen Orem, RN, BSN, [place] Oncology Nurse [place] at [place] [phone]

## 2016-10-30 ENCOUNTER — Telehealth: Payer: Self-pay

## 2016-10-30 ENCOUNTER — Telehealth: Payer: Self-pay | Admitting: *Deleted

## 2016-10-30 NOTE — Telephone Encounter (Signed)
Opened in error

## 2016-10-30 NOTE — Telephone Encounter (Signed)
Will come on Friday as scheduled

## 2016-10-30 NOTE — Telephone Encounter (Signed)
LM to see if they want to come in today at 2:00pm to see Dr Alvy Bimler

## 2016-10-30 NOTE — Telephone Encounter (Signed)
-----   Message from Flo Shanks, RN sent at 10/30/2016  8:15 AM EDT ----- Regarding: RE: follow-up conversation from last week   ----- Message ----- From: [name], MD Sent: 10/30/2016   7:05 AM To: Flo Shanks, RN Subject: follow-up conversation from last week          Does the patient/wife wants to come in to talk more at 2 pm or is it OK just to call? Reminder I will not talk on the phone for more than 10 mins. I can see them face to face at 2 pm and I have 45 mins

## 2016-10-31 ENCOUNTER — Encounter: Payer: PPO | Admitting: Nutrition

## 2016-11-01 DIAGNOSIS — I639 Cerebral infarction, unspecified: Secondary | ICD-10-CM

## 2016-11-01 HISTORY — DX: Cerebral infarction, unspecified: I63.9

## 2016-11-02 ENCOUNTER — Telehealth: Payer: Self-pay | Admitting: Hematology and Oncology

## 2016-11-02 ENCOUNTER — Ambulatory Visit (HOSPITAL_BASED_OUTPATIENT_CLINIC_OR_DEPARTMENT_OTHER): Payer: PPO | Admitting: Hematology and Oncology

## 2016-11-02 ENCOUNTER — Encounter: Payer: Self-pay | Admitting: Hematology and Oncology

## 2016-11-02 ENCOUNTER — Other Ambulatory Visit: Payer: Self-pay | Admitting: Hematology and Oncology

## 2016-11-02 VITALS — BP 131/70 | HR 93 | Temp 97.7°F | Resp 18 | Ht 68.0 in | Wt 158.8 lb

## 2016-11-02 DIAGNOSIS — R432 Parageusia: Secondary | ICD-10-CM

## 2016-11-02 DIAGNOSIS — R634 Abnormal weight loss: Secondary | ICD-10-CM | POA: Diagnosis not present

## 2016-11-02 DIAGNOSIS — C060 Malignant neoplasm of cheek mucosa: Secondary | ICD-10-CM | POA: Diagnosis not present

## 2016-11-02 DIAGNOSIS — R918 Other nonspecific abnormal finding of lung field: Secondary | ICD-10-CM | POA: Diagnosis not present

## 2016-11-02 DIAGNOSIS — G893 Neoplasm related pain (acute) (chronic): Secondary | ICD-10-CM

## 2016-11-02 MED ORDER — TRAMADOL HCL 50 MG PO TABS: 50.0000 mg | ORAL_TABLET | Freq: Four times a day (QID) | ORAL | 0 refills | Status: DC | PRN

## 2016-11-02 NOTE — Progress Notes (Signed)
[place] OFFICE PROGRESS NOTE  Patient Care Team: Janith Lima, Andres Smith as PCP - General (Internal Medicine) Sherren Mocha, Andres Smith as Consulting Physician (Cardiology) Jarome Matin, Andres Smith as Consulting Physician (Dermatology) Pyrtle, Lajuan Lines, Andres Smith as Consulting Physician (Gastroenterology) Katy Apo, Andres Smith as Consulting Physician (Ophthalmology) Frederik Schmidt, Andres Smith as Consulting Physician (Oral Surgery) [name], Andres Smith as Attending Physician (Radiation Oncology) Leota Sauers, RN as Oncology Nurse Navigator Karie Mainland, RD as Dietitian (Nutrition)  SUMMARY OF ONCOLOGIC HISTORY:   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 Tumor measures 0.9 cm in greatest dimension. Tumor extends to the inked deep tissue edge. Depth of invasion: 4 mm (in this material) See note.  Note: an immunostains for P16 was performed at an outside hospital and provided for [place] to review; p16 immunostain is negative. Per outside report, In situ hybridization for high-risk HPV types showed no evidence of transcriptionally active high-risk HPV types.      03/21/2016 Initial Diagnosis    Salient findings:  -EAC's clear -Lesion within left buccal mucosa (pictured below) with about 44mm depth. Width is about 2/3 of the left buccal mucosal surface area by palpation. Extends to the oral commissure.  -Tongue soft and mobile without mucosal lesions -Neck soft with no palpable neck masses -Right handed -Right hand is pink and warm with Allen's test negative (normal)  -Left forearm very sun damaged with multiple treated areas from precancerous or cancerous areas. Right forearm not as dramatic, presumably from sun exposure while driving      9/93/7169 Surgery    RESECTION OF ORAL BUCCAL MUCOSA SELECTIVE NECK DISSECTION FREE FLAP RADIAL FOREARM PR SPLIT [place] <100 SQCM  [15100] (SPLIT THICKNESS SKIN GRAFT LEG        04/13/2016 Pathology Results    A.LEFT ORAL COMMISSURE INFERIOR, EXCISION: No malignancy identified.  B.LEFT ORAL COMMISSURE SUPERIOR, EXCISION: No malignancy identified.  C.LEFT DEEP ORAL COMMISSURE, EXCISION: No malignancy identified.  D.RIGHT POSTERIOR BUCCAL INFERIOR, EXCISION: No malignancy identified.  E.RIGHT POSTERIOR BUCCAL SUPERIOR, EXCISION: No malignancy identified.  F.ORAL CAVITY, LEFT BUCCAL, EXCISION:  Invasive squamous cell carcinoma, moderately differentiated,  keratinizing. Tumor size:2.2 cm. Tumor depth of invasion:0.8 cm. Perineural invasion identified. Margins negative for malignancy. Pathologic stage:pT2 pN2a. See tumor protocol summary below.  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 Tumor deposit size:1.1 cm. Extranodal extension present. Benign salivary gland tissue.  SURGICAL PATHOLOGY CANCER CASE SUMMARY (AJCC 8TH EDITION) LIP AND ORAL CAVITY CAP Protocol posting date: June, 2017 Version: LipOralCavity 4.0.0.0  PROCEDURE: Wide resection of buccal mucosa TUMOR SITE:Oral:Buccal mucosa TUMOR LATERALITY: Left TUMOR FOCALITY: Unifocal TUMOR SIZE:Greatest dimension: 2.2 cm TUMOR DEPTH OF INVASION (DOI):8 mm HISTOLOGIC TYPE:Squamous cell carcinoma, conventional HISTOLOGIC GRADE:G2: Moderately differentiated SPECIMEN MARGINS:Uninvolved by invasive carcinoma  DISTANCE FROM CLOSEST MARGIN:1 mm  SPECIFY MARGIN(S):Deep LYMPHOVASCULAR INVASION:Present PERINEURAL INVASION: Present REGIONAL LYMPH NODES: NUMBER OF LYMPH NODES INVOLVED: 1 NUMBER OF LYMPH NODES EXAMINED: 4 LATERALITY OF LYMPH NODES INVOLVED: Ipsilateral SIZE OF LARGEST METASTATIC DEPOSIT:1.1 cm EXTRANODAL  EXTENSION:Present PATHOLOGIC STAGE CLASSIFICATION (pTNM, AJCC 8th Ed): pT2 pN2a pT2: umor >2 cm but <=4 cm and <=10 mm DOI pN2a:Metastasis in a single ipsilateral lymph node, 3 cm or  smaller in greatest dimension and ENE(+) BIOMARKERS:performed on prior outside biopsy [facility] review case [phone], outside case [id number]) P16 (Immunohistochemistry):Negative HPV (high-risk types by in situ hybridization):Reported as negative  05/11/2016 Imaging    1. 4 mm left lower lobe pulmonary nodule. Attention on follow-up imaging recommended as metastatic disease not excluded. 2. 2.8 cm left thyroid nodule. Thyroid ultrasound recommended to further evaluate. 3. Bilateral renal cysts. 4. Coronary artery and thoracoabdominal aortic atherosclerosis. 5. Ankylosing spondylitis.        05/11/2016 Imaging    CT neck: Surgical clips in the region of the left buccal mucosa, presumably at the previous primary site. Mild scarring in that region measuring about 8 mm. Cannot assess for residual or recurrent disease in that location, but this may simply be scarring. Previous left submandibular resection and left neck node dissection. No abnormal nodes presently. Multinodular goiter. Ankylosing spondylitis.  C1-2 remains a mobile segment.      05/21/2016 Procedure    1. Successful placement of a right internal jugular approach power injectable Port-A-Cath. The Port a catheter is ready for immediate use. 2. Successful fluoroscopic insertion of a 20-French pull-through gastrostomy tube. The gastrostomy may be used immediately for medication administration and may be utilized in 24 hrs for the initiation of feeds.      05/23/2016 - 07/09/2016 Radiation Therapy    Site/dose:     Left Cheek and bilateral neck / 60 Gy in 30 fractions to gross disease, 54 Gy in 30 fractions to intermediate risk nodal echelons Beams/energy:   IMRT / 6 MV photons       05/25/2016 - 06/15/2016 Chemotherapy    Andres Smith  received weekly reduced dose cisplatin       06/21/2016 Adverse Reaction    Cycle 5 is delayed due to pancytopenia      06/26/2016 - 06/29/2016 Hospital Admission    Andres Smith was admitted for management of neutropenic fever      08/27/2016 Imaging    1. Negative for pneumothorax or pleural effusion 2. Acute right eighth rib fracture. Questionable fracture deformity of the right ninth rib.      09/04/2016 Imaging    Left-sided rib fractures, without pneumothorax or pleural fluid      09/27/2016 Imaging    CT chest 1. Multiple (at least 6) scattered solid pulmonary nodules throughout both lungs, all new since 05/11/2016 chest CT and increased since 08/27/2016 chest CT, compatible with growing pulmonary metastases. 2. No thoracic adenopathy. 3. Additional findings include stable multinodular goiter and cholelithiasis.  Aortic Atherosclerosis (ICD10-I70.0).      09/27/2016 Imaging    1. Unchanged appearance of the left buccal mucosa resection site without evidence of disease recurrence or progression. 2. Unchanged cervical ankylosis. 3. Mild carotid and aortic atherosclerosis (ICD10-I70.0).      10/16/2016 Imaging    No evidence of active pulmonary disease.  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        INTERVAL HISTORY: Please see below for problem oriented charting. Andres Smith returns with family members to discuss plan of care The patient have lost some weight due to dysgeusia His rib pain is well controlled with tramadol Andres Smith denies constipation His energy level is excellent Andres Smith denies recent cough, chest pain or shortness of breath  REVIEW OF SYSTEMS:   Constitutional: Denies fevers, chills or abnormal weight loss Eyes: Denies blurriness of vision Ears, nose, mouth, throat, and face: Denies mucositis or sore throat Respiratory: Denies cough, dyspnea or wheezes Cardiovascular: Denies palpitation, chest discomfort or lower  extremity swelling Gastrointestinal:  Denies nausea, heartburn or change in bowel habits Skin: Denies abnormal skin rashes Lymphatics: Denies new lymphadenopathy  or easy bruising Neurological:Denies numbness, tingling or new weaknesses Behavioral/Psych: Mood is stable, no new changes  All other systems were reviewed with the patient and are negative.  I have reviewed the past medical history, past surgical history, social history and family history with the patient and they are unchanged from previous note.  ALLERGIES:  has No Known Allergies.  MEDICATIONS:  Current Outpatient Prescriptions  Medication Sig Dispense Refill  . aspirin EC 81 MG tablet Take 81 mg by mouth daily.    Marland Kitchen atorvastatin (LIPITOR) 80 MG tablet Take 80 mg by mouth at bedtime.    . balsalazide (COLAZAL) 750 MG capsule TAKE 3 CAPSULES 3 TIMES A DAY. OPEN CAPS & SPRINKLE ON APPLSAUCE OR DISSOLVE IN WATER & TAKE VIA TUBE. 270 capsule 0  . cyanocobalamin (,VITAMIN B-12,) 1000 MCG/ML injection INJECT 1ML ONCE MONTHLY. 3 mL 1  . isosorbide mononitrate (ISMO,MONOKET) 20 MG tablet Crush 2 tablets (40 mg total) by mouth 2 (two) times daily at 10 AM and 5 PM. 120 tablet 11  . magic mouthwash w/lidocaine SOLN Take 10 mLs by mouth 4 (four) times daily as needed for mouth pain. 240 mL 0  . mercaptopurine (PURINETHOL) 50 MG tablet TAKE ONE TABLET ONCE A DAY ON AN EMPTY STOMACH 1 HR BEFORE OR 2HRS AFTER A MEAL 30 tablet 2  . metFORMIN (GLUCOPHAGE) 500 MG tablet Place 1 tablet (500 mg total) into feeding tube 2 (two) times daily with a meal. 180 tablet 1  . naproxen sodium (ANAPROX) 220 MG tablet Take 220 mg by mouth 2 (two) times daily as needed.     . nitroGLYCERIN (NITROSTAT) 0.4 MG SL tablet Place 1 tablet (0.4 mg total) under the tongue every 5 (five) minutes as needed for chest pain. Max 3 doses. In no response call 911. 25 tablet 5  . Nutritional Supplements (FEEDING SUPPLEMENT, OSMOLITE 1.5 CAL,) LIQD Give 1 and 1/2 cans Osmolite  1.5 via PEG 4 times daily with 60 mL free water before and after bolus feedings. In addition, drink or flush PEG with 240 mL free water TID. Send formula and supplies. 6 Bottle 0  . polyethylene glycol (MIRALAX / GLYCOLAX) packet Take 17 g by mouth daily as needed for moderate constipation.    . predniSONE (DELTASONE) 5 MG tablet TAKE 2 TABLETS IN THE MORNING WITH BREAKFAST. 60 tablet 0  . traMADol (ULTRAM) 50 MG tablet Take 1 tablet (50 mg total) by mouth every 6 (six) hours as needed for moderate pain. 90 tablet 0   No current facility-administered medications for this visit.     PHYSICAL EXAMINATION: ECOG PERFORMANCE STATUS: 1 - Symptomatic but completely ambulatory  Vitals:   11/02/16 1123  BP: 131/70  Pulse: 93  Resp: 18  Temp: 97.7 F (36.5 C)  SpO2: 97%   Filed Weights   11/02/16 1123  Weight: 158 lb 12.8 oz (72 kg)    GENERAL:alert, no distress and comfortable SKIN: skin color, texture, turgor are normal, no rashes or significant lesions EYES: normal, Conjunctiva are pink and non-injected, sclera clear Musculoskeletal:no cyanosis of digits and no clubbing  NEURO: alert & oriented x 3 with fluent speech, no focal motor/sensory deficits  LABORATORY DATA:  I have reviewed the data as listed    Component Value Date/Time   NA 137 09/24/2016 1523   NA 141 07/24/2016 1347   K 4.3 09/24/2016 1523   K 5.3 (H) 07/24/2016 1347   CL 99 09/24/2016 1523   CO2 30 09/24/2016 1523  CO2 30 (H) 07/24/2016 1347   GLUCOSE 147 (H) 09/24/2016 1523   GLUCOSE 173 (H) 07/24/2016 1347   GLUCOSE 98 12/12/2005 0729   BUN 18 09/24/2016 1523   BUN 23.0 07/24/2016 1347   CREATININE 1.01 09/24/2016 1523   CREATININE 1.0 07/24/2016 1347   CALCIUM 9.5 09/24/2016 1523   CALCIUM 9.5 07/24/2016 1347   PROT 7.0 09/24/2016 1523   PROT 6.6 07/24/2016 1347   ALBUMIN 4.0 09/24/2016 1523   ALBUMIN 3.5 07/24/2016 1347   AST 16 09/24/2016 1523   AST 17 07/24/2016 1347   ALT 8 09/24/2016 1523    ALT 7 07/24/2016 1347   ALKPHOS 88 09/24/2016 1523   ALKPHOS 68 07/24/2016 1347   BILITOT 0.7 09/24/2016 1523   BILITOT 0.48 07/24/2016 1347   GFRNONAA >60 08/27/2016 1614   GFRAA >60 08/27/2016 1614    No results found for: SPEP, UPEP  Lab Results  Component Value Date   WBC 6.1 09/24/2016   NEUTROABS 5.4 09/24/2016   HGB 12.1 (L) 09/24/2016   HCT 36.9 (L) 09/24/2016   MCV 97.5 09/24/2016   PLT 209.0 09/24/2016      Chemistry      Component Value Date/Time   NA 137 09/24/2016 1523   NA 141 07/24/2016 1347   K 4.3 09/24/2016 1523   K 5.3 (H) 07/24/2016 1347   CL 99 09/24/2016 1523   CO2 30 09/24/2016 1523   CO2 30 (H) 07/24/2016 1347   BUN 18 09/24/2016 1523   BUN 23.0 07/24/2016 1347   CREATININE 1.01 09/24/2016 1523   CREATININE 1.0 07/24/2016 1347      Component Value Date/Time   CALCIUM 9.5 09/24/2016 1523   CALCIUM 9.5 07/24/2016 1347   ALKPHOS 88 09/24/2016 1523   ALKPHOS 68 07/24/2016 1347   AST 16 09/24/2016 1523   AST 17 07/24/2016 1347   ALT 8 09/24/2016 1523   ALT 7 07/24/2016 1347   BILITOT 0.7 09/24/2016 1523   BILITOT 0.48 07/24/2016 1347       RADIOGRAPHIC STUDIES: I have personally reviewed the radiological images as listed and agreed with the findings in the report. Dg Ribs Unilateral W/chest Right  Result Date: 10/16/2016 CLINICAL DATA:  Anterior right mid rib pain for 1 month. Previous history of right rib fractures 2 months ago. No new injury. EXAM: RIGHT RIBS AND CHEST - 3+ VIEW COMPARISON:  09/04/2016 FINDINGS: Postoperative changes in the mediastinum. Power port type central venous catheter with tip over the cavoatrial junction region. No pneumothorax. No airspace disease or consolidation in the lungs. No blunting of costophrenic angles. Tortuous aorta. Degenerative changes in the shoulders. Old left rib fractures. Gastrostomy tube in the left upper quadrant. There is a loss of bone and cortex demonstrated along the anterior aspect of the  right anterior ninth ribs. This is worrisome for lytic bone metastasis. No additional bone lesions are identified. No acute displaced fractures are visualized. IMPRESSION: No evidence of active pulmonary disease. 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 Electronically Signed   By: Lucienne Capers M.D.   On: 10/16/2016 21:23    ASSESSMENT & PLAN:  Buccal mucosa squamous cell carcinoma (HCC) We have very prolonged discussion today about risks, benefits, side effects of treatment and the timing of treatment and repeat of imaging study For now, Andres Smith is relatively asymptomatic except for occasional rib pain, well controlled with tramadol After extensive discussion, ultimately, Andres Smith agreed for repeat imaging study after Thanksgiving  and to start first dose chemotherapy after that We discussed the risk, benefits, side effects of chemotherapy with combination carboplatin and Taxol I would not add cetuximab due to 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 The intent is of palliative intent.  We discussed some of the risks, benefits, side-effects of carboplatin and Taxol  Some of the short term side-effects included, though not limited to, including weight loss, life threatening infections, risk of allergic reactions, need for transfusions of blood products, nausea, vomiting, change in bowel habits, loss of hair, admission to hospital for various reasons, and risks of death.   Long term side-effects are also discussed including risks of infertility, permanent damage to nerve function, hearing loss, chronic fatigue, kidney damage with possibility needing hemodialysis, and rare secondary malignancy including bone marrow disorders.  The patient is aware that the response rates discussed earlier is not guaranteed.  After a long discussion, patient made an informed decision to proceed with the prescribed plan of care.    Patient education material was dispensed. I will order PET CT scan before I see him back    Cancer associated pain The patient has intermittent rib pain I review his recent chest x-ray and his last CT scan dated 09/27/2016 Indeed, it appeared Andres Smith may have a rib lesion that was missed on the original report Andres Smith has intermittent symptomatic pain, typically positional in nature Low-dose tramadol appears to control his pain well We also discussed the role of palliative radiation therapy For now, we will continue tramadol as needed for pain  Weight loss Andres Smith has recent weight loss due to poor appetite and dysgeusia Andres Smith is improving with steroid therapy I recommend Andres Smith use his feeding tube for nutritional supplement   Orders Placed This Encounter  Procedures  . NM PET Image Restag (PS) Skull Base To Thigh    Standing Status:   Future    Standing Expiration Date:   11/02/2017    Order Specific Question:   If indicated for the ordered procedure, I authorize the administration of a radiopharmaceutical per Radiology protocol    Answer:   Yes    Order Specific Question:   Preferred imaging location?    Answer:   [facility]    Order Specific Question:   Radiology Contrast Protocol - do NOT remove file path    Answer:   [email or web address]   All questions were answered. The patient knows to call the clinic with any problems, questions or concerns. No barriers to learning was detected. I spent 40 minutes counseling the patient face to face. The total time spent in the appointment was 60 minutes and more than 50% was on counseling and review of test results     Andres Lark, Andres Smith 11/02/2016 2:37 PM

## 2016-11-02 NOTE — Telephone Encounter (Signed)
Gave avs and calendar for November and December  °

## 2016-11-02 NOTE — Assessment & Plan Note (Signed)
He has recent weight loss due to poor appetite and dysgeusia He is improving with steroid therapy I recommend he use his feeding tube for nutritional supplement

## 2016-11-02 NOTE — Assessment & Plan Note (Signed)
The patient has intermittent rib pain I review his recent chest x-ray and his last CT scan dated 09/27/2016 Indeed, it appeared he may have a rib lesion that was missed on the original report He has intermittent symptomatic pain, typically positional in nature Low-dose tramadol appears to control his pain well We also discussed the role of palliative radiation therapy For now, we will continue tramadol as needed for pain

## 2016-11-02 NOTE — Progress Notes (Signed)
DISCONTINUE ON PATHWAY REGIMEN - Head and Neck     Administer weekly:     Cisplatin   **Always confirm dose/schedule in your pharmacy ordering system**    REASON: Disease Progression PRIOR TREATMENT: [id number]: Cisplatin 40 mg/m2 Weekly with Concurrent Radiation for 6 - 7 Weeks TREATMENT RESPONSE: Progressive Disease (PD)  START OFF PATHWAY REGIMEN - Head and Neck   [id number]:Carboplatin + Paclitaxel + Cetuximab q21d:   A cycle is every 21 days:     Cetuximab      Cetuximab      Paclitaxel      Carboplatin   **Always confirm dose/schedule in your pharmacy ordering system**    Patient Characteristics: Oral Cavity, Metastatic (Squamous Cell), Second Line Disease Classification: Oral Cavity AJCC T Category: T2 Current Disease Status: Metastatic Disease AJCC M Category: M1 AJCC N Category: pN2c AJCC 8 Stage Grouping: IVC Line of therapy: Second Line Would you be surprised if this patient died  in the next year<= I would be surprised if this patient died in the next year Intent of Therapy: Non-Curative / Palliative Intent, Discussed with Patient

## 2016-11-02 NOTE — Assessment & Plan Note (Signed)
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 The intent is of palliative intent.  We discussed some of the risks, benefits, side-effects of carboplatin and Taxol  Some of the short term side-effects included, though not limited to, including weight loss, life threatening infections, risk of allergic reactions, need for transfusions of blood products, nausea, vomiting, change in bowel habits, loss of hair, admission to hospital for various reasons, and risks of death.   Long term side-effects are also discussed including risks of infertility, permanent damage to nerve function, hearing loss, chronic fatigue, kidney damage with possibility needing hemodialysis, and rare secondary malignancy including bone marrow disorders.  The patient is aware that the response rates discussed earlier is not guaranteed.  After a long discussion, patient made an informed decision to proceed with the prescribed plan of care.   Patient education material was dispensed. I will order PET CT scan before I see him back

## 2016-11-07 DIAGNOSIS — C069 Malignant neoplasm of mouth, unspecified: Secondary | ICD-10-CM | POA: Diagnosis not present

## 2016-11-07 DIAGNOSIS — I69991 Dysphagia following unspecified cerebrovascular disease: Secondary | ICD-10-CM | POA: Diagnosis not present

## 2016-11-12 ENCOUNTER — Telehealth: Payer: Self-pay | Admitting: *Deleted

## 2016-11-12 NOTE — Telephone Encounter (Addendum)
Oncology Nurse Navigator Documentation  Returned patient wife's 1350 VMM.    She asked for clarification of Nutrition appt scheduled she thought for tomorrow.  I determined it had been cancelled d/t to conflict with [facility] M[facility].  She requested appt be rescheduled next available per my follow-up.  She noted, too, he is having difficulty with dentures as they no longer fit well since tmt.  He refuses her encouragement to see his dentist.  She stated "he will listen to you or Barb".  I indicated one of [place] will speak with him, most likely Barb during rescheduled appt.  She agree to plan.  Gayleen Orem, RN, BSN, [place] Oncology Nurse [place] at [place] [phone]

## 2016-11-13 ENCOUNTER — Encounter: Payer: PPO | Admitting: Nutrition

## 2016-11-14 ENCOUNTER — Telehealth: Payer: Self-pay | Admitting: *Deleted

## 2016-11-14 NOTE — Telephone Encounter (Signed)
CALLED PATIENT TO ASK ABOUT RESCHEDULING NUTRITION APPT. WITH BARBARA NEFF, LVM FOR A RETURN CALL

## 2016-11-15 ENCOUNTER — Ambulatory Visit: Payer: PPO | Admitting: Nutrition

## 2016-11-15 ENCOUNTER — Ambulatory Visit: Payer: PPO | Admitting: Cardiovascular Disease

## 2016-11-15 NOTE — Progress Notes (Signed)
Nutrition follow-up completed with patient diagnosed with left buccal mucosa cancer. Patient to receive palliative chemotherapy using carboplatin and Taxol. Patient continues on tube feeding via PEG.  He is now at goal rate of 6 bottles of Osmolite 1.5 daily providing 2130 cal, 89.4 g protein. Patient is consuming small amounts of food during the day. He has dry mouth so he is constantly drinking water. Reports he can taste some of the food but he is only able to eat a small amount. Concern raised patient's dentures may not fit correctly Weight documented as 158.8 pounds on November 2.  Estimated nutrition needs: 2340-2540 calories, 106-120 grams protein, 2.5 L fluid.  Nutrition diagnosis: Unintended weight loss continues. Severe malnutrition, ongoing.  Intervention: I educated patient to continue 6 bottles of Osmolite 1.5 daily via PEG. He will give 2 bottles via PEG TID with 60 mL free water before and after bolus feedings. Educated patient to sit down at mealtimes and try small amounts of food as tolerated. Reviewed food options.  Patient and wife agreeable. Suggested patient have dentures evaluated.  He has lost 40 pounds since he has had his dentures fitted. Questions answered.  Teach back method used.  Monitoring, evaluation, goals: Patient will tolerate 6 bottles Osmolite 1.5 via PEG. He will attempt small amounts of food 4 times a day.  Next visit: November 28, during infusion.  **Disclaimer: This note was dictated with voice recognition software. Similar sounding words can inadvertently be transcribed and this note may contain transcription errors which may not have been corrected upon publication of note.**

## 2016-11-16 ENCOUNTER — Other Ambulatory Visit: Payer: Self-pay | Admitting: Hematology and Oncology

## 2016-11-18 ENCOUNTER — Other Ambulatory Visit: Payer: Self-pay | Admitting: Internal Medicine

## 2016-11-26 ENCOUNTER — Ambulatory Visit (HOSPITAL_COMMUNITY)
Admission: RE | Admit: 2016-11-26 | Discharge: 2016-11-26 | Disposition: A | Discharge status: Home / Self Care | Payer: PPO | Source: Ambulatory Visit | Attending: Hematology and Oncology | Admitting: Hematology and Oncology

## 2016-11-26 ENCOUNTER — Telehealth: Payer: Self-pay

## 2016-11-26 DIAGNOSIS — G459 Transient cerebral ischemic attack, unspecified: Secondary | ICD-10-CM | POA: Diagnosis not present

## 2016-11-26 DIAGNOSIS — G893 Neoplasm related pain (acute) (chronic): Secondary | ICD-10-CM | POA: Diagnosis not present

## 2016-11-26 DIAGNOSIS — R1310 Dysphagia, unspecified: Secondary | ICD-10-CM | POA: Diagnosis present

## 2016-11-26 DIAGNOSIS — M21371 Foot drop, right foot: Secondary | ICD-10-CM | POA: Diagnosis present

## 2016-11-26 DIAGNOSIS — I10 Essential (primary) hypertension: Secondary | ICD-10-CM | POA: Diagnosis present

## 2016-11-26 DIAGNOSIS — H53461 Homonymous bilateral field defects, right side: Secondary | ICD-10-CM | POA: Diagnosis present

## 2016-11-26 DIAGNOSIS — C7800 Secondary malignant neoplasm of unspecified lung: Secondary | ICD-10-CM | POA: Diagnosis present

## 2016-11-26 DIAGNOSIS — I361 Nonrheumatic tricuspid (valve) insufficiency: Secondary | ICD-10-CM | POA: Diagnosis not present

## 2016-11-26 DIAGNOSIS — I672 Cerebral atherosclerosis: Secondary | ICD-10-CM | POA: Diagnosis present

## 2016-11-26 DIAGNOSIS — C060 Malignant neoplasm of cheek mucosa: Secondary | ICD-10-CM

## 2016-11-26 DIAGNOSIS — E43 Unspecified severe protein-calorie malnutrition: Secondary | ICD-10-CM | POA: Diagnosis present

## 2016-11-26 DIAGNOSIS — Z7902 Long term (current) use of antithrombotics/antiplatelets: Secondary | ICD-10-CM | POA: Diagnosis not present

## 2016-11-26 DIAGNOSIS — I480 Paroxysmal atrial fibrillation: Secondary | ICD-10-CM | POA: Diagnosis present

## 2016-11-26 DIAGNOSIS — H5347 Heteronymous bilateral field defects: Secondary | ICD-10-CM | POA: Diagnosis present

## 2016-11-26 DIAGNOSIS — C7951 Secondary malignant neoplasm of bone: Secondary | ICD-10-CM | POA: Diagnosis present

## 2016-11-26 DIAGNOSIS — E1165 Type 2 diabetes mellitus with hyperglycemia: Secondary | ICD-10-CM | POA: Diagnosis present

## 2016-11-26 DIAGNOSIS — K5010 Crohn's disease of large intestine without complications: Secondary | ICD-10-CM | POA: Diagnosis not present

## 2016-11-26 DIAGNOSIS — Z951 Presence of aortocoronary bypass graft: Secondary | ICD-10-CM | POA: Diagnosis not present

## 2016-11-26 DIAGNOSIS — R918 Other nonspecific abnormal finding of lung field: Secondary | ICD-10-CM | POA: Insufficient documentation

## 2016-11-26 DIAGNOSIS — Z931 Gastrostomy status: Secondary | ICD-10-CM | POA: Diagnosis not present

## 2016-11-26 DIAGNOSIS — I63431 Cerebral infarction due to embolism of right posterior cerebral artery: Secondary | ICD-10-CM | POA: Diagnosis not present

## 2016-11-26 DIAGNOSIS — Z87891 Personal history of nicotine dependence: Secondary | ICD-10-CM | POA: Diagnosis not present

## 2016-11-26 DIAGNOSIS — I2510 Atherosclerotic heart disease of native coronary artery without angina pectoris: Secondary | ICD-10-CM | POA: Diagnosis present

## 2016-11-26 DIAGNOSIS — R29702 NIHSS score 2: Secondary | ICD-10-CM | POA: Diagnosis present

## 2016-11-26 DIAGNOSIS — I749 Embolism and thrombosis of unspecified artery: Secondary | ICD-10-CM | POA: Diagnosis not present

## 2016-11-26 DIAGNOSIS — I63432 Cerebral infarction due to embolism of left posterior cerebral artery: Secondary | ICD-10-CM | POA: Diagnosis present

## 2016-11-26 DIAGNOSIS — Z9221 Personal history of antineoplastic chemotherapy: Secondary | ICD-10-CM | POA: Diagnosis not present

## 2016-11-26 DIAGNOSIS — R471 Dysarthria and anarthria: Secondary | ICD-10-CM | POA: Diagnosis present

## 2016-11-26 DIAGNOSIS — C4492 Squamous cell carcinoma of skin, unspecified: Secondary | ICD-10-CM | POA: Diagnosis not present

## 2016-11-26 DIAGNOSIS — Z7952 Long term (current) use of systemic steroids: Secondary | ICD-10-CM | POA: Diagnosis not present

## 2016-11-26 DIAGNOSIS — E785 Hyperlipidemia, unspecified: Secondary | ICD-10-CM | POA: Diagnosis present

## 2016-11-26 DIAGNOSIS — D6859 Other primary thrombophilia: Secondary | ICD-10-CM | POA: Diagnosis present

## 2016-11-26 DIAGNOSIS — K529 Noninfective gastroenteritis and colitis, unspecified: Secondary | ICD-10-CM | POA: Diagnosis present

## 2016-11-26 DIAGNOSIS — R29818 Other symptoms and signs involving the nervous system: Secondary | ICD-10-CM | POA: Diagnosis not present

## 2016-11-26 DIAGNOSIS — I639 Cerebral infarction, unspecified: Secondary | ICD-10-CM | POA: Diagnosis not present

## 2016-11-26 LAB — GLUCOSE, CAPILLARY
GLUCOSE-CAPILLARY: 264 mg/dL — AB (ref 65–99)
GLUCOSE-CAPILLARY: 271 mg/dL — AB (ref 65–99)

## 2016-11-26 MED ORDER — FLUDEOXYGLUCOSE F - 18 (FDG) INJECTION
7.7000 | Freq: Once | INTRAVENOUS | Status: AC | PRN
  Administered 2016-11-26: 7.7 via INTRAVENOUS

## 2016-11-26 NOTE — Telephone Encounter (Signed)
Wife feels pt needs to see the dentist to get his dentures fit better. She feels this is affecting his eating. He is not eating. She heard there is a dentist that helps the head and neck cancer patients.  During conversation she remembered the doctor at [facility] said he has an excellent dentist he can recommend. She will ask him. Instructed her to call again if we can help.

## 2016-11-28 ENCOUNTER — Emergency Department (HOSPITAL_COMMUNITY): Payer: PPO

## 2016-11-28 ENCOUNTER — Inpatient Hospital Stay (HOSPITAL_COMMUNITY)
Admission: EM | Admit: 2016-11-28 | Discharge: 2016-11-30 | DRG: 061 | Disposition: A | Discharge status: Home Health | Payer: PPO | Attending: Neurology | Admitting: Neurology

## 2016-11-28 ENCOUNTER — Ambulatory Visit: Payer: PPO

## 2016-11-28 ENCOUNTER — Encounter (HOSPITAL_COMMUNITY): Payer: Self-pay | Admitting: Internal Medicine

## 2016-11-28 ENCOUNTER — Other Ambulatory Visit: Payer: Self-pay

## 2016-11-28 ENCOUNTER — Other Ambulatory Visit (HOSPITAL_BASED_OUTPATIENT_CLINIC_OR_DEPARTMENT_OTHER): Payer: PPO

## 2016-11-28 ENCOUNTER — Ambulatory Visit: Payer: PPO | Admitting: Nutrition

## 2016-11-28 ENCOUNTER — Encounter: Payer: Self-pay | Admitting: Hematology and Oncology

## 2016-11-28 ENCOUNTER — Other Ambulatory Visit (HOSPITAL_COMMUNITY): Payer: Self-pay

## 2016-11-28 ENCOUNTER — Ambulatory Visit (HOSPITAL_BASED_OUTPATIENT_CLINIC_OR_DEPARTMENT_OTHER): Payer: PPO | Admitting: Hematology and Oncology

## 2016-11-28 DIAGNOSIS — Z931 Gastrostomy status: Secondary | ICD-10-CM | POA: Diagnosis not present

## 2016-11-28 DIAGNOSIS — C060 Malignant neoplasm of cheek mucosa: Secondary | ICD-10-CM | POA: Diagnosis present

## 2016-11-28 DIAGNOSIS — Z79899 Other long term (current) drug therapy: Secondary | ICD-10-CM

## 2016-11-28 DIAGNOSIS — Z8249 Family history of ischemic heart disease and other diseases of the circulatory system: Secondary | ICD-10-CM

## 2016-11-28 DIAGNOSIS — K529 Noninfective gastroenteritis and colitis, unspecified: Secondary | ICD-10-CM | POA: Diagnosis present

## 2016-11-28 DIAGNOSIS — R29702 NIHSS score 2: Secondary | ICD-10-CM | POA: Diagnosis present

## 2016-11-28 DIAGNOSIS — I6521 Occlusion and stenosis of right carotid artery: Secondary | ICD-10-CM | POA: Diagnosis present

## 2016-11-28 DIAGNOSIS — Z7902 Long term (current) use of antithrombotics/antiplatelets: Secondary | ICD-10-CM

## 2016-11-28 DIAGNOSIS — I10 Essential (primary) hypertension: Secondary | ICD-10-CM | POA: Diagnosis present

## 2016-11-28 DIAGNOSIS — R1310 Dysphagia, unspecified: Secondary | ICD-10-CM | POA: Diagnosis present

## 2016-11-28 DIAGNOSIS — Z7952 Long term (current) use of systemic steroids: Secondary | ICD-10-CM

## 2016-11-28 DIAGNOSIS — Z923 Personal history of irradiation: Secondary | ICD-10-CM

## 2016-11-28 DIAGNOSIS — I749 Embolism and thrombosis of unspecified artery: Secondary | ICD-10-CM

## 2016-11-28 DIAGNOSIS — M21371 Foot drop, right foot: Secondary | ICD-10-CM | POA: Diagnosis present

## 2016-11-28 DIAGNOSIS — K5010 Crohn's disease of large intestine without complications: Secondary | ICD-10-CM | POA: Diagnosis not present

## 2016-11-28 DIAGNOSIS — I651 Occlusion and stenosis of basilar artery: Secondary | ICD-10-CM | POA: Diagnosis present

## 2016-11-28 DIAGNOSIS — C7800 Secondary malignant neoplasm of unspecified lung: Secondary | ICD-10-CM | POA: Diagnosis present

## 2016-11-28 DIAGNOSIS — I2510 Atherosclerotic heart disease of native coronary artery without angina pectoris: Secondary | ICD-10-CM | POA: Diagnosis present

## 2016-11-28 DIAGNOSIS — Z951 Presence of aortocoronary bypass graft: Secondary | ICD-10-CM

## 2016-11-28 DIAGNOSIS — I672 Cerebral atherosclerosis: Secondary | ICD-10-CM | POA: Diagnosis present

## 2016-11-28 DIAGNOSIS — D6859 Other primary thrombophilia: Secondary | ICD-10-CM | POA: Diagnosis present

## 2016-11-28 DIAGNOSIS — Z9221 Personal history of antineoplastic chemotherapy: Secondary | ICD-10-CM | POA: Diagnosis not present

## 2016-11-28 DIAGNOSIS — G893 Neoplasm related pain (acute) (chronic): Secondary | ICD-10-CM | POA: Diagnosis not present

## 2016-11-28 DIAGNOSIS — H53461 Homonymous bilateral field defects, right side: Secondary | ICD-10-CM | POA: Diagnosis present

## 2016-11-28 DIAGNOSIS — E43 Unspecified severe protein-calorie malnutrition: Secondary | ICD-10-CM | POA: Diagnosis present

## 2016-11-28 DIAGNOSIS — I639 Cerebral infarction, unspecified: Secondary | ICD-10-CM | POA: Diagnosis not present

## 2016-11-28 DIAGNOSIS — H5347 Heteronymous bilateral field defects: Secondary | ICD-10-CM | POA: Diagnosis present

## 2016-11-28 DIAGNOSIS — I63432 Cerebral infarction due to embolism of left posterior cerebral artery: Secondary | ICD-10-CM | POA: Diagnosis present

## 2016-11-28 DIAGNOSIS — C7951 Secondary malignant neoplasm of bone: Secondary | ICD-10-CM | POA: Diagnosis present

## 2016-11-28 DIAGNOSIS — E785 Hyperlipidemia, unspecified: Secondary | ICD-10-CM | POA: Diagnosis present

## 2016-11-28 DIAGNOSIS — E1165 Type 2 diabetes mellitus with hyperglycemia: Secondary | ICD-10-CM | POA: Diagnosis present

## 2016-11-28 DIAGNOSIS — G459 Transient cerebral ischemic attack, unspecified: Secondary | ICD-10-CM | POA: Insufficient documentation

## 2016-11-28 DIAGNOSIS — Z6823 Body mass index (BMI) 23.0-23.9, adult: Secondary | ICD-10-CM

## 2016-11-28 DIAGNOSIS — I361 Nonrheumatic tricuspid (valve) insufficiency: Secondary | ICD-10-CM | POA: Diagnosis not present

## 2016-11-28 DIAGNOSIS — R471 Dysarthria and anarthria: Secondary | ICD-10-CM | POA: Diagnosis present

## 2016-11-28 DIAGNOSIS — I480 Paroxysmal atrial fibrillation: Secondary | ICD-10-CM | POA: Diagnosis present

## 2016-11-28 DIAGNOSIS — Z8261 Family history of arthritis: Secondary | ICD-10-CM

## 2016-11-28 LAB — COMPREHENSIVE METABOLIC PANEL
ALBUMIN: 3.3 g/dL — AB (ref 3.5–5.0)
ALK PHOS: 72 U/L (ref 40–150)
ALK PHOS: 68 U/L (ref 38–126)
ALT: 11 U/L (ref 0–55)
ALT: 14 U/L — AB (ref 17–63)
AST: 15 U/L (ref 5–34)
AST: 21 U/L (ref 15–41)
Albumin: 3.4 g/dL — ABNORMAL LOW (ref 3.5–5.0)
Anion Gap: 10 mEq/L (ref 3–11)
Anion gap: 9 (ref 5–15)
BUN: 22.8 mg/dL (ref 7.0–26.0)
BUN: 23 mg/dL — ABNORMAL HIGH (ref 6–20)
CALCIUM: 9.1 mg/dL (ref 8.9–10.3)
CHLORIDE: 95 meq/L — AB (ref 98–109)
CHLORIDE: 95 mmol/L — AB (ref 101–111)
CO2: 31 meq/L — AB (ref 22–29)
CO2: 29 mmol/L (ref 22–32)
CREATININE: 0.7 mg/dL (ref 0.61–1.24)
Calcium: 9.9 mg/dL (ref 8.4–10.4)
Creatinine: 0.9 mg/dL (ref 0.7–1.3)
GFR calc Af Amer: >60 mL/min (ref >60)
GFR calc non Af Amer: >60 mL/min (ref >60)
GLUCOSE: 207 mg/dL — AB (ref 70–140)
GLUCOSE: 205 mg/dL — AB (ref 65–99)
POTASSIUM: 4.2 meq/L (ref 3.5–5.1)
Potassium: 4.2 mmol/L (ref 3.5–5.1)
SODIUM: 135 meq/L — AB (ref 136–145)
SODIUM: 133 mmol/L — AB (ref 135–145)
Total Bilirubin: 0.64 mg/dL (ref 0.20–1.20)
Total Bilirubin: 0.6 mg/dL (ref 0.3–1.2)
Total Protein: 7.1 g/dL (ref 6.4–8.3)
Total Protein: 6.3 g/dL — ABNORMAL LOW (ref 6.5–8.1)

## 2016-11-28 LAB — CBC WITH DIFFERENTIAL/PLATELET
BASO%: 0.4 % (ref 0.0–2.0)
BASOS ABS: 0 10*3/uL (ref 0.0–0.1)
EOS ABS: 0.1 10*3/uL (ref 0.0–0.5)
EOS%: 1.3 % (ref 0.0–7.0)
HCT: 35.4 % — ABNORMAL LOW (ref 38.4–49.9)
HGB: 11.6 g/dL — ABNORMAL LOW (ref 13.0–17.1)
LYMPH%: 7.8 % — AB (ref 14.0–49.0)
MCH: 31.5 pg (ref 27.2–33.4)
MCHC: 32.7 g/dL (ref 32.0–36.0)
MCV: 96.2 fL (ref 79.3–98.0)
MONO#: 0.7 10*3/uL (ref 0.1–0.9)
MONO%: 9.8 % (ref 0.0–14.0)
NEUT#: 5.5 10*3/uL (ref 1.5–6.5)
NEUT%: 80.7 % — ABNORMAL HIGH (ref 39.0–75.0)
Platelets: 195 10*3/uL (ref 140–400)
RBC: 3.68 10*6/uL — AB (ref 4.20–5.82)
RDW: 16.8 % — ABNORMAL HIGH (ref 11.0–14.6)
WBC: 6.9 10*3/uL (ref 4.0–10.3)
lymph#: 0.5 10*3/uL — ABNORMAL LOW (ref 0.9–3.3)

## 2016-11-28 LAB — DIFFERENTIAL
BASOS ABS: 0 10*3/uL (ref 0.0–0.1)
BASOS PCT: 0 %
Eosinophils Absolute: 0 10*3/uL (ref 0.0–0.7)
Eosinophils Relative: 0 %
LYMPHS PCT: 8 %
Lymphs Abs: 0.5 10*3/uL — ABNORMAL LOW (ref 0.7–4.0)
Monocytes Absolute: 0.2 10*3/uL (ref 0.1–1.0)
Monocytes Relative: 3 %
NEUTROS ABS: 6.3 10*3/uL (ref 1.7–7.7)
NEUTROS PCT: 89 %

## 2016-11-28 LAB — MRSA PCR SCREENING: MRSA BY PCR: NEGATIVE

## 2016-11-28 LAB — RAPID URINE DRUG SCREEN, HOSP PERFORMED
AMPHETAMINES: NOT DETECTED
BARBITURATES: NOT DETECTED
BENZODIAZEPINES: NOT DETECTED
Cocaine: NOT DETECTED
Opiates: NOT DETECTED
Tetrahydrocannabinol: NOT DETECTED

## 2016-11-28 LAB — URINALYSIS, ROUTINE W REFLEX MICROSCOPIC
Bilirubin Urine: NEGATIVE
Glucose, UA: 50 mg/dL — AB
HGB URINE DIPSTICK: NEGATIVE
Ketones, ur: NEGATIVE mg/dL
Leukocytes, UA: NEGATIVE
Nitrite: NEGATIVE
PROTEIN: NEGATIVE mg/dL
SPECIFIC GRAVITY, URINE: 1.013 (ref 1.005–1.030)
pH: 8 (ref 5.0–8.0)

## 2016-11-28 LAB — CBC
HCT: 30.9 % — ABNORMAL LOW (ref 39.0–52.0)
Hemoglobin: 10.3 g/dL — ABNORMAL LOW (ref 13.0–17.0)
MCH: 31.7 pg (ref 26.0–34.0)
MCHC: 33.3 g/dL (ref 30.0–36.0)
MCV: 95.1 fL (ref 78.0–100.0)
PLATELETS: 154 10*3/uL (ref 150–400)
RBC: 3.25 MIL/uL — AB (ref 4.22–5.81)
RDW: 15.2 % (ref 11.5–15.5)
WBC: 7.1 10*3/uL (ref 4.0–10.5)

## 2016-11-28 LAB — PROTIME-INR
INR: 0.97
PROTHROMBIN TIME: 12.8 s (ref 11.4–15.2)

## 2016-11-28 LAB — APTT: APTT: 27 s (ref 24–36)

## 2016-11-28 LAB — I-STAT TROPONIN, ED: Troponin i, poc: 0.01 ng/mL (ref 0.00–0.08)

## 2016-11-28 MED ORDER — ALTEPLASE (STROKE) FULL DOSE INFUSION
0.9000 mg/kg | Freq: Once | INTRAVENOUS | Status: AC
  Administered 2016-11-28: 65 mg via INTRAVENOUS
  Filled 2016-11-28: qty 65

## 2016-11-28 MED ORDER — SENNOSIDES-DOCUSATE SODIUM 8.6-50 MG PO TABS: 1.0000 | ORAL_TABLET | Freq: Every evening | ORAL | Status: DC | PRN

## 2016-11-28 MED ORDER — SODIUM CHLORIDE 0.9% FLUSH
10.0000 mL | Freq: Once | INTRAVENOUS | Status: DC
  Filled 2016-11-28: qty 10

## 2016-11-28 MED ORDER — SODIUM CHLORIDE 0.9 % IV SOLN
INTRAVENOUS | Status: DC
  Administered 2016-11-28 – 2016-11-30 (×3): via INTRAVENOUS

## 2016-11-28 MED ORDER — LABETALOL HCL 5 MG/ML IV SOLN
20.0000 mg | Freq: Once | INTRAVENOUS | Status: DC
  Filled 2016-11-28: qty 4

## 2016-11-28 MED ORDER — ACETAMINOPHEN 325 MG PO TABS: 650.0000 mg | ORAL_TABLET | ORAL | Status: DC | PRN

## 2016-11-28 MED ORDER — SODIUM CHLORIDE 0.9% FLUSH: 10.0000 mL | INTRAVENOUS | Status: DC | PRN

## 2016-11-28 MED ORDER — PANTOPRAZOLE SODIUM 40 MG IV SOLR
40.0000 mg | Freq: Every day | INTRAVENOUS | Status: DC
  Administered 2016-11-28 – 2016-11-29 (×2): 40 mg via INTRAVENOUS
  Filled 2016-11-28 (×2): qty 40

## 2016-11-28 MED ORDER — IOPAMIDOL (ISOVUE-370) INJECTION 76%
INTRAVENOUS | Status: AC
  Filled 2016-11-28: qty 100

## 2016-11-28 MED ORDER — ACETAMINOPHEN 650 MG RE SUPP: 650.0000 mg | RECTAL | Status: DC | PRN

## 2016-11-28 MED ORDER — ONDANSETRON HCL 8 MG PO TABS: 8.0000 mg | ORAL_TABLET | Freq: Three times a day (TID) | ORAL | 3 refills | Status: DC | PRN

## 2016-11-28 MED ORDER — SODIUM CHLORIDE 0.9 % IV SOLN
50.0000 mL | Freq: Once | INTRAVENOUS | Status: AC
  Administered 2016-11-28: 50 mL via INTRAVENOUS

## 2016-11-28 MED ORDER — IOPAMIDOL (ISOVUE-370) INJECTION 76%
100.0000 mL | Freq: Once | INTRAVENOUS | Status: AC | PRN
  Administered 2016-11-28: 50 mL via INTRAVENOUS

## 2016-11-28 MED ORDER — STROKE: EARLY STAGES OF RECOVERY BOOK
Freq: Once | Status: AC
  Administered 2016-11-28: 21:00:00
  Filled 2016-11-28: qty 1

## 2016-11-28 MED ORDER — SODIUM CHLORIDE 0.9 % IV BOLUS (SEPSIS)
500.0000 mL | Freq: Once | INTRAVENOUS | Status: AC
  Administered 2016-11-28: 500 mL via INTRAVENOUS

## 2016-11-28 MED ORDER — ACETAMINOPHEN 160 MG/5ML PO SOLN
650.0000 mg | ORAL | Status: DC | PRN
  Administered 2016-11-28: 650 mg
  Filled 2016-11-28: qty 20.3

## 2016-11-28 MED ORDER — SODIUM CHLORIDE 0.9 % IV SOLN
100.0000 mL/h | INTRAVENOUS | Status: DC
  Administered 2016-11-28: 100 mL/h via INTRAVENOUS

## 2016-11-28 MED ORDER — NICARDIPINE HCL IN NACL 20-0.86 MG/200ML-% IV SOLN
0.0000 mg/h | INTRAVENOUS | Status: DC
  Filled 2016-11-28: qty 200

## 2016-11-28 MED ORDER — PROCHLORPERAZINE MALEATE 10 MG PO TABS: 10.0000 mg | ORAL_TABLET | Freq: Four times a day (QID) | ORAL | 0 refills | Status: DC | PRN

## 2016-11-28 MED ORDER — NICARDIPINE HCL IN NACL 20-0.86 MG/200ML-% IV SOLN: 0.0000 mg/h | INTRAVENOUS | Status: DC

## 2016-11-28 MED ORDER — CHLORHEXIDINE GLUCONATE CLOTH 2 % EX PADS
6.0000 | MEDICATED_PAD | Freq: Every day | CUTANEOUS | Status: DC
  Administered 2016-11-29: 6 via TOPICAL

## 2016-11-28 MED ORDER — LABETALOL HCL 5 MG/ML IV SOLN: 20.0000 mg | Freq: Once | INTRAVENOUS | Status: DC

## 2016-11-28 NOTE — ED Notes (Signed)
Patient transported to CT 

## 2016-11-28 NOTE — Progress Notes (Signed)
Attempted to see patient in infusion room. Patient had just been sent to the ED for evaluation.

## 2016-11-28 NOTE — ED Notes (Signed)
Attempted report x 1; name and call back number provided 

## 2016-11-28 NOTE — Consult Note (Signed)
TeleNeurology Consult Note  Andres Smith     Consulting Physician: Clabe Seal Code Status: Prior Primary Care Physician:  Janith Lima, MD  DOB:  [birth year]   Age: 76 y.o.  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 Emegent telestroke consult requested. Neuroimaging done and case discussed with ED staff. Patient was to begin a new round of chemotherapy today. On ASA. Pateint appears to have a right hemianopsia.    Verbal Consent to tPA by patient 6063 I have explained to the patient/family the nature of the patient's condition, the use of tPA fibrinolytic agent, and the benefits to be reasonably expected compared with alternative approaches. I have discussed the likelihood of major risks or complications of this procedure including (if applicable) but not limited to loss of limb function, brain damage, paralysis, hemorrhage, infection, complications from transfusion of blood components, drug reactions, blood clots and loss of life. I have also indicated that with any procedure there is always the possibility of an unexpected complication. I have explained the risks which include:      Death, Stroke or permanent neurologic injury (paralysis, coma, etc)      Worsening of stroke symptoms from swelling or bleeding in the brain      Bleeding in other parts of the body      Need for blood transfusions to replace blood or clotting factors      Allergic reaction to medications      Other unexpected complications      All questions were answered and the patient or family expressed understanding of the treatment plan and consent to the procedure.  Review of  Systems  Patient Active Problem List   Diagnosis Date Noted  . TIA due to embolism ([place]) 11/28/2016  . Cancer associated pain 10/23/2016  . Multiple pulmonary nodules 10/10/2016  . Goals of care, counseling/discussion 10/10/2016  . Dysgeusia 09/12/2016  . Foot drop, right 08/23/2016  . Anemia, chronic disease 07/13/2016  . Acquired pancytopenia ([place]) 06/14/2016  . Other constipation 06/07/2016  . Weight loss 05/31/2016  . 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 B12 deficiency anemia 08/03/2013  . Ulcerative colitis ([place]) 01/17/2010  . Occlusion and stenosis of carotid artery without mention of cerebral infarction 09/20/2009  . GERD 12/02/2008  . Type II diabetes mellitus with manifestations ([place]) 04/27/2008  . Hyperlipidemia with target LDL less than 70 12/04/2007  . Essential hypertension, benign 12/04/2007  . Ankylosing spondylitis ([place]) 12/04/2007  . Coronary atherosclerosis of native coronary artery 12/04/2007  . 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 Dysphagia 05/06/2007   Past Medical History:  Diagnosis Date  . Ankylosing spondylitis ([place])   . Blood transfusion    hx of transfusion without reaction  . Bradycardia   . Cancer ([place]) 03/2016   oral  . Cholelithiasis   . Coronary artery disease    a. s/p CABG x 6 in 1997 (LIMA->LAD, VG->RI ->OM1->OM2, VG->PDA->PLV // b. 05/2011 Cath:  patent grafs, native prox rca and d2 dzs  ->med rx. // c. Myoview 1/18: not gated, large inferolateral scar, no ischemia; Intermediate Risk (IL scar old - on prior studies >> med rx)  . Crohn's colitis (  [place]) 12/01/1997  . Diabetes mellitus   . Dysphagia   . Fatty liver   . GERD (gastroesophageal reflux disease)   . Heart block   . Hiatal hernia 12/03/2008  . History of radiation therapy 05/23/16- 07/09/16   Left Cheek/ 60 Gy in 30 fractions to gross disease, 63 Gy in 35 fractions to high risk nodal echelons, and 56 Gy in 35 fractions to intermedicate risk nodal  echelons.   . History of skin cancer   . Hx of echocardiogram    Echo 5/14:  Mild LVH, EF 55-60%, NL diast fxn, mild LAE   . Hyperlipidemia   . Hypertension   . Kidney stones   . Neck pain   . Pneumonia    hx of PNA  . Squamous cell cancer of buccal mucosa ([place])   . 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 cancer removal  2014   removed from left outter leg  . COLONOSCOPY  2012  . CORONARY ARTERY BYPASS GRAFT  04/19/1995   x7  . EXCISION ORAL TUMOR Left    left jaw and lymph node  . IR FLUORO GUIDE PORT INSERTION RIGHT  05/21/2016  . IR GASTROSTOMY TUBE MOD SED  05/21/2016  . IR US GUIDE VASC ACCESS RIGHT  05/21/2016  . LEFT HEART CATHETERIZATION WITH CORONARY/GRAFT ANGIOGRAM N/A 05/24/2011   Procedure: LEFT HEART CATHETERIZATION WITH Beatrix Fetters;  Surgeon: Larey Dresser, MD;  Location[facility] C[facility] LAB;  Service: Cardiovascular;  Laterality: N/A;  . MOHS SURGERY     X 2 off chin and nose  . TONSILLECTOMY AND ADENOIDECTOMY  1944  . TRIGGER FINGER RELEASE     x 2  . UMBILICAL HERNIA REPAIR     No Known Allergies  Social History   Socioeconomic History  . Marital status: Married    Spouse name: Not on file  . Number of children: 2  . Years of education: Not on file  . Highest education level: Not on file  Social Needs  . Financial resource strain: Not on file  . Food insecurity - worry: Not on file  . Food insecurity - inability: Not on file  . Transportation needs - medical: Not on file  . Transportation needs - non-medical: Not on file  Occupational History  . Occupation: semi retired    Fish farm manager: SELF EMPLOYED  Tobacco Use  . Smoking status: Former Smoker    Last attempt to quit: 01/02/1983    Years since quitting: 33.9  . Smokeless tobacco: Former Systems developer    Types: [place] date: 01/01/2010  Substance and Sexual Activity  . Alcohol use: Yes    Comment: tottie every night  . Drug  use: No  . Sexual activity: Not Currently  Other Topics Concern  . Not on file  Social History Narrative   HSG,  - Civil engineering/education in industrial, [place] from [place]. Married '64. 1 son '74, 1 dtr - '69; 1 grandchild, 1 on the way. 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 Lives with wife. ACP -            Family History Family History  Problem Relation Age of Onset  . Heart disease Father   . Heart disease Mother   . Arthritis Mother   .  Heart disease Unknown   . Crohn's disease Unknown   . Heart disease Sister   . Colon cancer Paternal Grandmother       Objective:  Vital signs in last 24 hours: Temp:  [97 F (36.1 C)-97.8 F (36.6 C)] 97.8 F (36.6 C) (11/28 1252) Pulse Rate:  [82-93] 90 (11/28 1252) Resp:  [18-19] 19 (11/28 1252) BP: (119-137)/(65-89) 137/89 (11/28 1252) SpO2:  [98 %] 98 % (11/28 1252) Weight:  [72.6 kg (160 lb 1.6 oz)] 72.6 kg (160 lb 1.6 oz) (11/28 1047) Temp (24hrs), Avg:97.3 F (36.3 C), Min:97 F (36.1 C), Max:97.8 F (36.6 C)    Intake/Output last 3 shifts: No intake/output data recorded.  Intake/Output this shift: No intake/output data recorded.  Physical Exam 1a- LOC: Keenly responsive - 0      1b- LOC questions: Answers both questions correctly - 0     1c- LOC commands- Performs both tasks correctly- 0     2- Gaze: Normal; no gaze paresis or gaze deviation - 0     3- Visual Fields: right hemianopsia - 2     4- Facial movements: no facial palsy - 0     5a- Right Upper limb motor - no drift -0     5b -Left Upper limb motor - no drift -0     6a- Right Lower limb motor - no drift - 0      6b- Left Lower limb motor - no drift - 0 7- Limb Coordination: absent ataxia - 0      8- Sensory : no sensory loss - 0      9- Language  - No aphasia - 0      10- Speech - No dysarthria -0     11- Neglect / Extinction - none found -0     NIHSS score   2   Diagnostic Findings:  Pertinent Labs:  Recent Labs  Lab 11/28/16 1028  WBC 6.9  MCV 96.2   Recent Labs  Lab 11/28/16 1028  CHLORIDE 95*  CALCIUM 9.9  BUN 22.8  GLUCOSE 207*  CREATININE 0.9  CO2 31*   Recent Labs  Lab 11/28/16 1028  AST 15  ALT 11  ALBUMIN 3.4*    Extensive review of previous medical records completed.    Assessment: Patient Active Problem List   Diagnosis Date Noted  . TIA due to embolism ([place]) 11/28/2016  . Cancer associated pain 10/23/2016  . Multiple pulmonary nodules 10/10/2016  . Goals of care, counseling/discussion 10/10/2016  . Dysgeusia 09/12/2016  . Foot drop, right 08/23/2016  . Anemia, chronic disease 07/13/2016  . Acquired pancytopenia ([place]) 06/14/2016  . Other constipation 06/07/2016  . Weight loss 05/31/2016  . 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 B12 deficiency anemia 08/03/2013  . Ulcerative colitis ([place]) 01/17/2010  . Occlusion and stenosis of carotid artery without mention of cerebral infarction 09/20/2009  . GERD 12/02/2008  . Type II diabetes mellitus with manifestations ([place]) 04/27/2008  . Hyperlipidemia with target LDL less than 70 12/04/2007  . Essential hypertension, benign 12/04/2007  . Ankylosing spondylitis ([place]) 12/04/2007  . Coronary atherosclerosis of native coronary artery 12/04/2007  . 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 Dysphagia 05/06/2007     Plan: [facility] TeleNeurology Consult Services  Impression: Stroke     Differential Diagnosis:  1. Cardioembolic stroke  2. Small vessel disease/lacune    3. Thromboembolic, artery-to-artery mechanism 4. Hypercoagulable state-related infarct  5. Thrombotic mechanism, large artery disease 6.  Transient ischemic attack  Comments: Last known well:   [place] [phone] [facility] contacted:    [phone] [facility] at bedside:   1319 NIHSS assessment time (after neuroimaging):1326 CT head reviewed at  1325 TPA ordered at 1341 Needle time:1402 (pharamcy mixing delay) Thrombectomy not considered since large vessel (MCA/ACA or Basilar artery) occlusion is not suspected and h/o metastatic cancer.      Discussion:     Our recommendations are outlined below.  Recommendations: Admit to ICU s/p TPA for Neurochecks, BP control and repeat CT head in 24 hours DVT prophylaxis    Patient advised not to driving given his visual deficit Follow up with Neurology for further testing and evaluation  Medical Decision Making:  - Extensive number of diagnosis or management options are considered above. - Extensive amount of complex data reviewed.  - High risk of complication and/or morbidity or mortality are associated with differential diagnostic considerations above. - There may be uncertain outcome and increased probability of prolonged functional impairment or high probability of severe prolonged functional impairment associated with some of these differential diagnosis.  Medical Data Reviewed: 1.Data reviewed include clinical labs, radiology, Medical Tests; 2.Tests results discussed w/performing or interpreting physician;    3.Obtaining/reviewing old medical records; 4.Obtaining case history from another source;    5.Independent review of image, tracing or specimen.  11/28/2016  1:20 PM

## 2016-11-28 NOTE — ED Notes (Signed)
Code stroke called by Dr. Vanita Panda. Tele stroke machine moved to pt's room and t

## 2016-11-28 NOTE — ED Provider Notes (Signed)
[facility] DEPT Provider Note   CSN: [id number] Arrival date & time: 11/28/16  1251     History   Chief Complaint Chief Complaint  Patient presents with  . Visual Field Change    field cut    HPI Andres Smith is a 76 y.o. male.  HPI Patient presents due to sudden onset vision deficit.  Patient was receiving chemotherapy infusion, had just received his heparin flush, when he felt onset of vision loss. He describes no pain, but inability to see from the right side. No new weakness anywhere, though he does have generalized mild weakness. He notes that he was otherwise in his usual state of health, aside from ongoing cancer therapy prior to the onset of symptoms. Acknowledges a history of TIA takes only aspirin for prophylaxis. Past Medical History:  Diagnosis Date  . Ankylosing spondylitis ([place])   . Blood transfusion    hx of transfusion without reaction  . Bradycardia   . Cancer ([place]) 03/2016   oral  . Cholelithiasis   . Coronary artery disease    a. s/p CABG x 6 in 1997 (LIMA->LAD, VG->RI ->OM1->OM2, VG->PDA->PLV // b. 05/2011 Cath:  patent grafs, native prox rca and d2 dzs  ->med rx. // c. Myoview 1/18: not gated, large inferolateral scar, no ischemia; Intermediate Risk (IL scar old - on prior studies >> med rx)  . Crohn's colitis ([place]) 12/01/1997  . Diabetes mellitus   . Dysphagia   . Fatty liver   . GERD (gastroesophageal reflux disease)   . Heart block   . Hiatal hernia 12/03/2008  . History of radiation therapy 05/23/16- 07/09/16   Left Cheek/ 60 Gy in 30 fractions to gross disease, 63 Gy in 35 fractions to high risk nodal echelons, and 56 Gy in 35 fractions to intermedicate risk nodal echelons.   . History of skin cancer   . Hx of echocardiogram    Echo 5/14:  Mild LVH, EF 55-60%, NL diast fxn, mild LAE   . Hyperlipidemia   . Hypertension   . Kidney stones   . Neck pain   . Pneumonia    hx of PNA  . 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 TIA due to embolism ([place]) 11/28/2016  . Cancer associated pain 10/23/2016  . Multiple pulmonary nodules 10/10/2016  . Goals of care, counseling/discussion 10/10/2016  . Dysgeusia 09/12/2016  . Foot drop, right 08/23/2016  . Anemia, chronic disease 07/13/2016  . Acquired pancytopenia ([place]) 06/14/2016  . Other constipation 06/07/2016  . Weight loss 05/31/2016  . 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 B12 deficiency anemia 08/03/2013  . Ulcerative colitis ([place]) 01/17/2010  . Occlusion and stenosis of carotid artery without mention of cerebral infarction 09/20/2009  . GERD 12/02/2008  . Type II diabetes mellitus with manifestations ([place]) 04/27/2008  . Hyperlipidemia with target LDL less than 70 12/04/2007  . Essential hypertension, benign 12/04/2007  . Ankylosing spondylitis ([place]) 12/04/2007  . Coronary atherosclerosis of native coronary artery 12/04/2007  . 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 Dysphagia 05/06/2007    Past Surgical History:  Procedure Laterality Date  . cancer removal  2014   removed from left outter leg  . COLONOSCOPY  2012  . CORONARY ARTERY BYPASS GRAFT  04/19/1995   x7  . EXCISION ORAL TUMOR Left    left  jaw and lymph node  . IR FLUORO GUIDE PORT INSERTION RIGHT  05/21/2016  . IR GASTROSTOMY TUBE MOD SED  05/21/2016  . IR US GUIDE VASC ACCESS RIGHT  05/21/2016  . LEFT HEART CATHETERIZATION WITH CORONARY/GRAFT ANGIOGRAM N/A 05/24/2011   Procedure: LEFT HEART CATHETERIZATION WITH Beatrix Fetters;  Surgeon: Larey Dresser, MD;  Location: [facility] CATH LAB;  Service: Cardiovascular;  Laterality: N/A;  . MOHS SURGERY     X 2 off chin and nose  . TONSILLECTOMY AND ADENOIDECTOMY  1944  . TRIGGER FINGER RELEASE     x 2  . UMBILICAL HERNIA REPAIR         Home Medications    Prior to Admission  medications   Medication Sig Start Date End Date Taking? Authorizing Provider  aspirin EC 81 MG tablet Take 81 mg by mouth daily.    [provider]  atorvastatin (LIPITOR) 80 MG tablet Take 80 mg by mouth at bedtime.    [provider]  balsalazide (COLAZAL) 750 MG capsule TAKE 3 CAPSULES 3 TIMES A DAY. OPEN CAPS & SPRINKLE ON APPLESAUCE OR DISSOLVE IN WATER & TAKE VIA TUBE. 11/19/16   Pyrtle, Lajuan Lines, MD  cyanocobalamin (,VITAMIN B-12,) 1000 MCG/ML injection INJECT 1ML ONCE MONTHLY. 05/22/16   Janith Lima, MD  isosorbide mononitrate (ISMO,MONOKET) 20 MG tablet Crush 2 tablets (40 mg total) by mouth 2 (two) times daily at 10 AM and 5 PM. 07/03/16   Sherren Mocha, MD  magic mouthwash w/lidocaine SOLN Take 10 mLs by mouth 4 (four) times daily as needed for mouth pain. 06/29/16   Hongalgi, Lenis Dickinson, MD  mercaptopurine (PURINETHOL) 50 MG tablet TAKE ONE TABLET ONCE A DAY ON AN EMPTY STOMACH 1 HR BEFORE OR 2HRS AFTER A MEAL 10/15/16   Pyrtle, Lajuan Lines, MD  metFORMIN (GLUCOPHAGE) 500 MG tablet Place 1 tablet (500 mg total) into feeding tube 2 (two) times daily with a meal. 09/07/16   Janith Lima, MD  naproxen sodium (ANAPROX) 220 MG tablet Take 220 mg by mouth 2 (two) times daily as needed.     [provider]  nitroGLYCERIN (NITROSTAT) 0.4 MG SL tablet Place 1 tablet (0.4 mg total) under the tongue every 5 (five) minutes as needed for chest pain. Max 3 doses. In no response call 911. 09/05/16   Sherren Mocha, MD  Nutritional Supplements (FEEDING SUPPLEMENT, OSMOLITE 1.5 CAL,) LIQD Give 1 and 1/2 cans Osmolite 1.5 via PEG 4 times daily with 60 mL free water before and after bolus feedings. In addition, drink or flush PEG with 240 mL free water TID. Send formula and supplies. 06/19/16   [name], MD  ondansetron (ZOFRAN) 8 MG tablet Take 1 tablet (8 mg total) by mouth every 8 (eight) hours as needed for nausea. 11/28/16   [name], MD  polyethylene glycol (MIRALAX / GLYCOLAX)  packet Take 17 g by mouth daily as needed for moderate constipation.    [provider]  predniSONE (DELTASONE) 5 MG tablet TAKE 2 TABLETS IN THE MORNING WITH BREAKFAST. 11/16/16   [name], MD  prochlorperazine (COMPAZINE) 10 MG tablet Take 1 tablet (10 mg total) by mouth every 6 (six) hours as needed for nausea or vomiting. 11/28/16   [name], MD  traMADol (ULTRAM) 50 MG tablet Take 1 tablet (50 mg total) by mouth every 6 (six) hours as needed for moderate pain. 11/02/16   [name], MD    Family History Family History  Problem Relation  Age of Onset  . Heart disease Father   . Heart disease Mother   . Arthritis Mother   . Heart disease Unknown   . Crohn's disease Unknown   . Heart disease Sister   . Colon cancer Paternal Grandmother     Social History Social History   Tobacco Use  . Smoking status: Former Smoker    Last attempt to quit: 01/02/1983    Years since quitting: 33.9  . Smokeless tobacco: Former Systems developer    Types: Chew    Quit date: 01/01/2010  Substance Use Topics  . Alcohol use: Yes    Comment: tottie every night  . Drug use: No     Allergies   Patient has no known allergies.   Review of Systems Review of Systems  Constitutional:       Per HPI, otherwise negative  HENT:       Per HPI, otherwise negative  Eyes: Positive for visual disturbance. Negative for pain.  Respiratory:       Per HPI, otherwise negative  Cardiovascular:       Per HPI, otherwise negative  Gastrointestinal: Negative for vomiting.  Endocrine:       Negative aside from HPI  Genitourinary:       Neg aside from HPI   Musculoskeletal:       Per HPI, otherwise negative  Skin: Negative.   Allergic/Immunologic: Positive for immunocompromised state.  Neurological: Negative for syncope and headaches.     Physical Exam Updated Vital Signs BP 137/89   Pulse 90   Temp 97.8 F (36.6 C) (Oral)   Resp 19   SpO2 98%   Physical Exam  Constitutional: He is oriented to  person, place, and time. He appears well-developed. No distress.  HENT:  Head: Normocephalic and atraumatic.  Eyes: Conjunctivae and EOM are normal.  Cardiovascular: Normal rate and regular rhythm.  Pulmonary/Chest: Effort normal. No stridor. No respiratory distress.  Upper chest port, unremarkable  Abdominal: He exhibits no distension.    Musculoskeletal: He exhibits no edema.  Neurological: He is alert and oriented to person, place, and time. He displays atrophy. A cranial nerve deficit is present.  EOMI, but w R lateral gaze of R eye >L eye there is no perception Face symmetric Strength 5/5 in UE. No aphasia  Skin: Skin is warm and dry.  Psychiatric: He has a normal mood and affect.  Nursing note and vitals reviewed.    ED Treatments / Results  Labs (all labs ordered are listed, but only abnormal results are displayed) Labs Reviewed  CBC - Abnormal; Notable for the following components:      Result Value   RBC 3.25 (*)    Hemoglobin 10.3 (*)    HCT 30.9 (*)    All other components within normal limits  DIFFERENTIAL - Abnormal; Notable for the following components:   Lymphs Abs 0.5 (*)    All other components within normal limits  PROTIME-INR  APTT  COMPREHENSIVE METABOLIC PANEL  RAPID URINE DRUG SCREEN, HOSP PERFORMED  URINALYSIS, ROUTINE W REFLEX MICROSCOPIC  I-STAT TROPONIN, ED    Radiology Nm Pet Image Initial (pi) Skull Base To Thigh  Result Date: 11/26/2016 CLINICAL DATA:  Subsequent treatment strategy for 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 Patient status post chemo radiation therapy. EXAM: NUCLEAR MEDICINE PET SKULL BASE TO THIGH TECHNIQUE: 7.6 mCi F-18 FDG was injected intravenously. Full-ring PET imaging was performed from the skull base to thigh after the  radiotracer. CT data was obtained and used for attenuation correction and anatomic localization. FASTING BLOOD GLUCOSE:  Value: 264 mg/dl COMPARISON:  CT chest 09/27/2016  FINDINGS: NECK No residual hypermetabolic activity in the oral cavity. No hypermetabolic lymph nodes in the neck. Postsurgical change in the LEFT submandibular neck. CHEST There are several solid pulmonary nodules increased in size from comparison CT and with associated metabolic activity. For example: RIGHT upper lobe nodule measuring 13 mm with SUV max 4.5. 16  mm RIGHT lower lobe nodule with SUV max equal 6.0 19 mm nodule in the medial LEFT lower lobe with SUV max 5.5. These 3 nodules are on same image slice (image 37, series 2). ABDOMEN/PELVIS Percutaneous gastrostomy tube in the stomach. Gallstones noted. No hypermetabolic lesion in the liver and. Normal adrenal glands. No hypermetabolic abdominopelvic nodes.  Prostate gland SKELETON Expansile metastatic lesion within the anteromedial aspect of the right eighth rib with soft tissue component measuring 3.5 by 4.0 cm and with intense peripheral metabolic activity (SUV max 7.4). Second skeletal metastasis within the RIGHT iliac wing associated with lesion lytic lesion and SUV max equal 6.6. IMPRESSION: 1. Bilateral hypermetabolic enlarging nodules consistent with metastatic pulmonary nodules. 2. 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 Second metastatic lesion in the RIGHT iliac wing. 3. No evidence of local recurrence within the oral cavity Electronically Signed   By: Suzy Bouchard M.D.   On: 11/26/2016 16:05   Ct Head Code Stroke Wo Contrast  Result Date: 11/28/2016 CLINICAL DATA:  Code stroke. 76 year old male scheduled for chemotherapy today, acute onset loss of right side vision in both eyes. A fib. EXAM: CT HEAD WITHOUT CONTRAST TECHNIQUE: Contiguous axial images were obtained from the base of the skull through the vertex without intravenous contrast. COMPARISON:  Neck CT 09/27/2016.  Head CT 06/26/2016 and earlier. FINDINGS: Brain: Stable cerebral volume. No acute intracranial hemorrhage  identified. No midline shift, mass effect, or evidence of intracranial mass lesion. No ventriculomegaly. 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 Comparatively mild patchy bilateral white matter hypodensity appears stable. No cortically based acute infarct identified. Vascular: Calcified atherosclerosis at the skull base. No suspicious intracranial vascular hyperdensity. Skull: Degenerative changes in the upper cervical spine. No acute osseous abnormality identified. Sinuses/Orbits: Clear. Other: Orbits soft tissues appear stable and normal. Visualized scalp soft tissues are within normal limits. ASPECTS [facility] Stroke Program Early CT Score) - Ganglionic level infarction (caudate, lentiform nuclei, internal capsule, insula, M1-M3 cortex): 7 - Supraganglionic infarction (M4-M6 cortex): 3 Total score (0-10 with 10 being normal): 10 IMPRESSION: 1. No acute cortically based infarct or acute intracranial hemorrhage identified. 2. ASPECTS is 10. 3. 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 4. Study discussed by telephone with Dr. Carmin Muskrat on 11/28/2016 at 13:39 . Electronically Signed   By: Genevie Ann M.D.   On: 11/28/2016 13:39    Procedures Procedures (including critical care time)  Medications Ordered in ED Medications  sodium chloride 0.9 % bolus 500 mL (500 mLs Intravenous New Bag/Given 11/28/16 1335)    Followed by  0.9 %  sodium chloride infusion (not administered)  alteplase (ACTIVASE) 1 mg/mL infusion 65 mg (not administered)    Followed by  0.9 %  sodium chloride infusion (not administered)  labetalol (NORMODYNE,TRANDATE) injection 20 mg (not administered)    And  nicardipine (CARDENE) 20mg  in 0.86% saline 22ml IV infusion (0.1 mg/ml) (  not administered)     Initial Impression / Assessment and Plan / ED Course  I have reviewed the triage vital signs and  the nursing notes.  Pertinent labs & imaging results that were available during my care of the patient were reviewed by me and considered in my medical decision making (see chart for details).  Discussed patient's case with our radiologist after CT results were available, no evidence for hemorrhage. Subsequently, the patient was evaluated by our tele-neurology consulting team, and with exam consistent with line, right-sided hemianopsia, indication was for TPA. Patient acknowledged this. I discussed this with the patient's wife at length, including risks and benefits.  Subsequently, the patient required transfer to our stroke center for further evaluation and management.  Final Clinical Impressions(s) / ED Diagnoses  Hemianopsia  CRITICAL CARE Performed by: Carmin Muskrat Total critical care time: 35 minutes Critical care time was exclusive of separately billable procedures and treating other patients. Critical care was necessary to treat or prevent imminent or life-threatening deterioration. Critical care was time spent personally by me on the following activities: development of treatment plan with patient and/or surrogate as well as nursing, discussions with consultants, evaluation of patient's response to treatment, examination of patient, obtaining history from patient or surrogate, ordering and performing treatments and interventions, ordering and review of laboratory studies, ordering and review of radiographic studies, pulse oximetry and re-evaluation of patient's condition.    Carmin Muskrat, MD 11/28/16 [phone]

## 2016-11-28 NOTE — ED Notes (Signed)
Bed: RESB Expected date:  Expected time:  Means of arrival:  Comments: Andres Smith

## 2016-11-28 NOTE — Progress Notes (Signed)
Patient to clinic for 1st Taxol and Carboplatin, teaching done and [facility] accessed which patient tolerated well.  Upon flushing port with normal saline patient experienced blurry vision bilaterally and lost peripheral vision on right side.  Patient described dizziness.  Sandi Mealy PA-C paged to chair side and EKG ordered.  Patient with irregular heart rate and peripheral vision not improved with vaso vagel exercise.  Rapid Response paged.  Patient transported to ED via stretcher.  Wife called and she is coming back to [facility].  Patient alert and oriented throughout assessment.  Report given to ED nurse upon arrival to ED.

## 2016-11-28 NOTE — Assessment & Plan Note (Signed)
We have reviewed PET/CT scan today The patient is relatively asymptomatic from disease We will proceed with carboplatin and Taxol as discussed The patient understood the potential risk of pancytopenia, peripheral neuropathy and infection I will see him on a weekly basis for supportive care

## 2016-11-28 NOTE — ED Notes (Signed)
Pt arrived via [facility]- reported that pt was at cancer center receiving treatment for metastatic disease when they flushed his port-o-cath, pt began having complete R sided vision loss; pt given 65mg  tPA prior to arrival; [facility] reports vitals remained stable enroute; pt alert and oriented at this time

## 2016-11-28 NOTE — ED Notes (Addendum)
[facility] at bedside.  Report given 

## 2016-11-28 NOTE — H&P (Signed)
H and P    Chief Complaint: stroke  History obtained from:  Patient     HPI:                                                                                                                                         Andres Smith is an 76 y.o. 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  Patient was receiving chemotherapy infusion, had just received his heparin flush, when he felt onset of vision loss.  Due to these findings he was given TPA and CTA of head and neck were obtained.  CTA did not show stenosis to account for his symptoms.  On arrival to ED - he continues to have right hemianopsia with no other symptoms.   At baseline he has PEG tube for dysphagia and right foot drop.   Date last known well: Date: 11/28/2016 Time last known well: Time: 12:10 tPA Given: Yes NIHSS2 Modified Rankin: Rankin Score=0    Past Medical History:  Diagnosis Date  . Ankylosing spondylitis ([place])   . Blood transfusion    hx of transfusion without reaction  . Bradycardia   . Cancer ([place]) 03/2016   oral  . Cholelithiasis   . Coronary artery disease    a. s/p CABG x 6 in 1997 (LIMA->LAD, VG->RI ->OM1->OM2, VG->PDA->PLV // b. 05/2011 Cath:  patent grafs, native prox rca and d2 dzs  ->med rx. // c. Myoview 1/18: not gated, large inferolateral scar, no ischemia; Intermediate Risk (IL scar old - on prior studies >> med rx)  . Crohn's colitis ([place]) 12/01/1997  . Diabetes mellitus   . Dysphagia   . Fatty liver   . GERD (gastroesophageal reflux disease)   . Heart block   . Hiatal hernia 12/03/2008  . History of radiation therapy 05/23/16- 07/09/16   Left Cheek/ 60 Gy in 30 fractions to gross disease, 63 Gy in 35 fractions to high risk nodal echelons, and 56 Gy in  35 fractions to intermedicate risk nodal echelons.   . History of skin cancer   . Hx of echocardiogram    Echo 5/14:  Mild LVH, EF 55-60%, NL diast fxn, mild LAE   . Hyperlipidemia   . Hypertension   . Kidney stones   . Neck pain   . Pneumonia    hx of PNA  . Squamous cell cancer of buccal mucosa ([place])   . 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 cancer removal  2014   removed from left outter leg  . COLONOSCOPY  2012  . CORONARY ARTERY BYPASS GRAFT  04/19/1995   x7  . EXCISION ORAL TUMOR Left  left jaw and lymph node  . IR FLUORO GUIDE PORT INSERTION RIGHT  05/21/2016  . IR GASTROSTOMY TUBE MOD SED  05/21/2016  . IR US GUIDE VASC ACCESS RIGHT  05/21/2016  . LEFT HEART CATHETERIZATION WITH CORONARY/GRAFT ANGIOGRAM N/A 05/24/2011   Procedure: LEFT HEART CATHETERIZATION WITH Beatrix Fetters;  Surgeon: Larey Dresser, MD;  Location: [facility] CATH LAB;  Service: Cardiovascular;  Laterality: N/A;  . MOHS SURGERY     X 2 off chin and nose  . TONSILLECTOMY AND ADENOIDECTOMY  1944  . TRIGGER FINGER RELEASE     x 2  . UMBILICAL HERNIA REPAIR      Family History  Problem Relation Age of Onset  . Heart disease Father   . Heart disease Mother   . Arthritis Mother   . Heart disease Unknown   . Crohn's disease Unknown   . Heart disease Sister   . Colon cancer Paternal Grandmother    Social History:  reports that he quit smoking about 33 years ago. He quit smokeless tobacco use about 6 years ago. His smokeless tobacco use included chew. He reports that he drinks alcohol. He reports that he does not use drugs.  Allergies: No Known Allergies  Medications:                                                                                                                           Current Facility-Administered Medications  Medication Dose Route Frequency Provider Last Rate Last Dose  . 0.9 %  sodium chloride  infusion  100 mL/hr Intravenous Continuous Carmin Muskrat, MD 100 mL/hr at 11/28/16 1400 100 mL/hr at 11/28/16 1400  . 0.9 %  sodium chloride infusion  50 mL Intravenous Once Clabe Seal, MD      . iopamidol (ISOVUE-370) 76 % injection           . labetalol (NORMODYNE,TRANDATE) injection 20 mg  20 mg Intravenous Once Clabe Seal, MD       And  . nicardipine (CARDENE) 20mg  in 0.86% saline 256ml IV infusion (0.1 mg/ml)  0-15 mg/hr Intravenous Continuous Clabe Seal, MD       Current Outpatient Medications  Medication Sig Dispense Refill  . aspirin EC 81 MG tablet Take 81 mg by mouth daily.    Marland Kitchen atorvastatin (LIPITOR) 80 MG tablet Take 80 mg by mouth at bedtime.    . balsalazide (COLAZAL) 750 MG capsule TAKE 3 CAPSULES 3 TIMES A DAY. OPEN CAPS & SPRINKLE ON APPLESAUCE OR DISSOLVE IN WATER & TAKE VIA TUBE. 270 capsule 0  . cyanocobalamin (,VITAMIN B-12,) 1000 MCG/ML injection INJECT 1ML ONCE MONTHLY. 3 mL 1  . isosorbide mononitrate (ISMO,MONOKET) 20 MG tablet Crush 2 tablets (40 mg total) by mouth 2 (two) times daily at 10 AM and 5 PM. 120 tablet 11  . magic mouthwash w/lidocaine SOLN Take 10 mLs by mouth 4 (four) times daily as needed for mouth pain. 240 mL 0  .  mercaptopurine (PURINETHOL) 50 MG tablet TAKE ONE TABLET ONCE A DAY ON AN EMPTY STOMACH 1 HR BEFORE OR 2HRS AFTER A MEAL 30 tablet 2  . metFORMIN (GLUCOPHAGE) 500 MG tablet Place 1 tablet (500 mg total) into feeding tube 2 (two) times daily with a meal. 180 tablet 1  . naproxen sodium (ANAPROX) 220 MG tablet Take 220 mg by mouth 2 (two) times daily as needed.     . nitroGLYCERIN (NITROSTAT) 0.4 MG SL tablet Place 1 tablet (0.4 mg total) under the tongue every 5 (five) minutes as needed for chest pain. Max 3 doses. In no response call 911. 25 tablet 5  . Nutritional Supplements (FEEDING SUPPLEMENT, OSMOLITE 1.5 CAL,) LIQD Give 1 and 1/2 cans Osmolite 1.5 via PEG 4 times daily with 60 mL free water before and after  bolus feedings. In addition, drink or flush PEG with 240 mL free water TID. Send formula and supplies. 6 Bottle 0  . ondansetron (ZOFRAN) 8 MG tablet Take 1 tablet (8 mg total) by mouth every 8 (eight) hours as needed for nausea. 30 tablet 3  . polyethylene glycol (MIRALAX / GLYCOLAX) packet Take 17 g by mouth daily as needed for moderate constipation.    . predniSONE (DELTASONE) 5 MG tablet TAKE 2 TABLETS IN THE MORNING WITH BREAKFAST. 60 tablet 0  . prochlorperazine (COMPAZINE) 10 MG tablet Take 1 tablet (10 mg total) by mouth every 6 (six) hours as needed for nausea or vomiting. 30 tablet 0  . traMADol (ULTRAM) 50 MG tablet Take 1 tablet (50 mg total) by mouth every 6 (six) hours as needed for moderate pain. 90 tablet 0     ROS:                                                                                                                                       History obtained from the patient  General ROS: negative for - chills, fatigue, fever, night sweats, weight gain or weight loss Psychological ROS: negative for - behavioral disorder, hallucinations, memory difficulties, mood swings or suicidal ideation Ophthalmic ROS: negative for - blurry vision, double vision, eye pain or loss of vision ENT ROS: negative for - epistaxis, nasal discharge, oral lesions, sore throat, tinnitus or vertigo Allergy and Immunology ROS: negative for - hives or itchy/watery eyes Hematological and Lymphatic ROS: negative for - bleeding problems, bruising or swollen lymph nodes Endocrine ROS: negative for - galactorrhea, hair pattern changes, polydipsia/polyuria or temperature intolerance Respiratory ROS: negative for - cough, hemoptysis, shortness of breath or wheezing Cardiovascular ROS: negative for - chest pain, dyspnea on exertion, edema or irregular heartbeat Gastrointestinal ROS: negative for - abdominal pain, diarrhea, hematemesis, nausea/vomiting or stool incontinence Genito-Urinary ROS: negative for -  dysuria, hematuria, incontinence or urinary frequency/urgency Musculoskeletal ROS: negative for - joint swelling or muscular weakness Neurological ROS: as noted in HPI Dermatological ROS: negative for rash and skin  lesion changes  Neurologic Examination:                                                                                                      Blood pressure 133/69, pulse 89, temperature 97.6 F (36.4 C), resp. rate 17, SpO2 96 %.  HEENT-  Normocephalic, no lesions, without obvious abnormality.  Normal external eye and conjunctiva.  Normal TM's bilaterally.  Normal auditory canals and external ears. Normal external nose, mucus membranes and septum.  Normal pharynx. Cardiovascular- S1, S2 normal, pulses palpable throughout   Lungs- chest clear, no wheezing, rales, normal symmetric air entry Abdomen- normal findings: bowel sounds normal Extremities- no edema Lymph-no adenopathy palpable Musculoskeletal-no joint tenderness, deformity or swelling Skin-warm and dry, no hyperpigmentation, vitiligo, or suspicious lesions  Neurological Examination Mental Status: Alert, oriented, thought content appropriate.  Speech fluent without evidence of aphasia.  Able to follow 3 step commands without difficulty. Cranial Nerves: II: right homonymous hemianopsia III,IV, VI: ptosis not present, extra-ocular motions intact bilaterally, pupils equal, round, reactive to light and accommodation V,VII: smile symmetric, facial light touch sensation normal bilaterally VIII: hearing normal bilaterally IX,X: uvula rises symmetrically XI: bilateral shoulder shrug XII: midline tongue extension Motor: Right : Upper extremity   5/5    Left:     Upper extremity   5/5  Lower extremity   5/5     Lower extremity   5/5 Right foot drop Tone and bulk:normal tone throughout; no atrophy noted Sensory: Pinprick and light touch intact throughout, bilaterally Deep Tendon Reflexes: 2+ and symmetric  throughout Plantars: Right: downgoing   Left: downgoing Cerebellar: normal finger-to-nose,  and normal heel-to-shin test Gait: not tested   Lab Results: Basic Metabolic Panel: Recent Labs  Lab 11/28/16 1028 11/28/16 1333  NA 135* 133*  K 4.2 4.2  CL  --  95*  CO2 31* 29  GLUCOSE 207* 205*  BUN 22.8 23*  CREATININE 0.9 0.70  CALCIUM 9.9 9.1    Liver Function Tests: Recent Labs  Lab 11/28/16 1028 11/28/16 1333  AST 15 21  ALT 11 14*  ALKPHOS 72 68  BILITOT 0.64 0.6  PROT 7.1 6.3*  ALBUMIN 3.4* 3.3*   No results for input(s): LIPASE, AMYLASE in the last 168 hours. No results for input(s): AMMONIA in the last 168 hours.  CBC: Recent Labs  Lab 11/28/16 1028 11/28/16 1333  WBC 6.9 7.1  NEUTROABS 5.5 6.3  HGB 11.6* 10.3*  HCT 35.4* 30.9*  MCV 96.2 95.1  PLT 195 154   CBG: Recent Labs  Lab 11/26/16 1303 11/26/16 1311  GLUCAP 101* 271*    Microbiology: Results for orders placed or performed during the hospital encounter of 06/26/16  Blood Culture (routine x 2)     Status: None   Collection Time: 06/26/16  7:23 PM  Result Value Ref Range Status   Specimen Description BLOOD PORTA CATH  Final   Special Requests   Final    BOTTLES DRAWN AEROBIC AND ANAEROBIC Blood Culture adequate volume   Culture   Final    NO GROWTH 5  DAYS Performed at [facility], [place] [address] [place], [place] [address]    Report Status 07/01/2016 FINAL  Final  Blood Culture (routine x 2)     Status: None   Collection Time: 06/26/16  7:24 PM  Result Value Ref Range Status   Specimen Description BLOOD LEFT ANTECUBITAL  Final   Special Requests   Final    BOTTLES DRAWN AEROBIC AND ANAEROBIC Blood Culture adequate volume   Culture   Final    NO GROWTH 5 DAYS Performed at [facility], [place] [address] [place], [place] [address]    Report Status 07/01/2016 FINAL  Final  Urine culture     Status: None   Collection Time: 06/26/16 10:28 PM  Result Value Ref Range  Status   Specimen Description URINE, CLEAN CATCH  Final   Special Requests NONE  Final   Culture   Final    NO GROWTH Performed at [facility], [place] [address] [place], [place] [address]    Report Status 06/28/2016 FINAL  Final    Coagulation Studies: Recent Labs    11/28/16 1333  LABPROT 12.8  INR 0.97    Imaging: Ct Angio Head W Or Wo Contrast  Result Date: 11/28/2016 CLINICAL DATA:  76 year old male acute onset right side hemianopia today. History of prior bilateral cerebellar infarcts. Symptom onset when the patient presented to the cancer center for chemotherapy. EXAM: CT ANGIOGRAPHY HEAD TECHNIQUE: Multidetector CT imaging of the head was performed using the standard protocol during bolus administration of intravenous contrast. Multiplanar CT image reconstructions and MIPs were obtained to evaluate the vascular anatomy. CONTRAST:  30mL ISOVUE-370 IOPAMIDOL (ISOVUE-370) INJECTION 76% COMPARISON:  Head CT without contrast 1322 hours today. FINDINGS: Posterior circulation: Codominant distal vertebral arteries are patent to the vertebrobasilar junction. There is calcified right V4 segment plaque with mild stenosis. There is noncalcified mild to moderate stenosis of the left V4 segment (series 10, image 26). Right PICA origin an dominant appearing left AICA origin are patent. Patent vertebrobasilar junction. Mild to moderate irregularity and stenosis of the midbasilar artery. The distal basilar is patent. SCA (duplicated, normal variant) and PCA origins appear normal. Posterior communicating arteries are diminutive or absent. The left PCA P1, P2 segments and bifurcation is not patent and normal. The left P3 branches appear within normal limits. Contralateral right PCA branches appear normal. Anterior circulation: Distal cervical ICAs are patent. Tortuous bilateral ICA siphons are patent with mild to moderate left and moderate to severe right side calcified atherosclerosis. There is  moderate to severe right ICA supraclinoid segment stenosis. No significant stenosis on the left. Both ophthalmic artery origins are patent. Bilateral carotid termini are patent. MCA and ACA origins are normal. Anterior communicating artery and bilateral ACA branches are within normal limits. Left MCA M1 segment, left MCA trifurcation and left MCA branches are within normal limits. Right MCA M1 segment, bifurcation, and right MCA branches are within normal limits. Venous sinuses: Patent. Anatomic variants: None. Delayed phase: Stable gray-white matter differentiation throughout the brain. No cortically based acute infarct identified. No acute intracranial hemorrhage identified. No midline shift, mass effect, or evidence of intracranial mass lesion. No abnormal enhancement identified. There is probably a small area of chronic encephalomalacia in the posterior right MCA territory, right peri-Rolandic cortex on series 16, image 22 which appears stable since 06/26/2016. Review of the MIP images confirms the above findings IMPRESSION: 1. Negative for emergent large intracranial vessel occlusion. Dr. Lovenia Kim gave  a preliminary report of this finding to Dr. Jerelyn Charles. 2. Posterior circulation atherosclerosis including multifocal mild to moderate bilateral distal vertebral and basilar artery stenosis. The left PCA appears normal. 3. Stable CT appearance of the brain from earlier today. No acute or evolving infarct identified. 4. Bulky right ICA siphon calcified atherosclerosis with moderate or severe supraclinoid ICA stenosis. 5. Elsewhere the anterior circulation is within normal limits. Electronically Signed   By: Genevie Ann M.D.   On: 11/28/2016 15:09   Ct Head Code Stroke Wo Contrast  Result Date: 11/28/2016 CLINICAL DATA:  Code stroke. 76 year old male scheduled for chemotherapy today, acute onset loss of right side vision in both eyes. A fib. EXAM: CT HEAD WITHOUT CONTRAST TECHNIQUE: Contiguous axial images were  obtained from the base of the skull through the vertex without intravenous contrast. COMPARISON:  Neck CT 09/27/2016.  Head CT 06/26/2016 and earlier. FINDINGS: Brain: Stable cerebral volume. No acute intracranial hemorrhage identified. No midline shift, mass effect, or evidence of intracranial mass lesion. No ventriculomegaly. 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 Comparatively mild patchy bilateral white matter hypodensity appears stable. No cortically based acute infarct identified. Vascular: Calcified atherosclerosis at the skull base. No suspicious intracranial vascular hyperdensity. Skull: Degenerative changes in the upper cervical spine. No acute osseous abnormality identified. Sinuses/Orbits: Clear. Other: Orbits soft tissues appear stable and normal. Visualized scalp soft tissues are within normal limits. ASPECTS [facility] Stroke Program Early CT Score) - Ganglionic level infarction (caudate, lentiform nuclei, internal capsule, insula, M1-M3 cortex): 7 - Supraganglionic infarction (M4-M6 cortex): 3 Total score (0-10 with 10 being normal): 10 IMPRESSION: 1. No acute cortically based infarct or acute intracranial hemorrhage identified. 2. ASPECTS is 10. 3. 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 4. Study discussed by telephone with Dr. Carmin Muskrat on 11/28/2016 at 13:39 . Electronically Signed   By: Genevie Ann M.D.   On: 11/28/2016 13:39   Assessment and plan discussed with with attending physician and they are in agreement.    Etta Quill PA-C [facility] [phone]  11/28/2016, 3:22 PM   Assessment: 76 y.o. male with right hemianopsia S/P tPA.  Stroke Risk Factors - diabetes mellitus, hyperlipidemia and hypertension PLAN 1. HgbA1c, fasting lipid panel 2. MRI of the brain without contrast 3. PT consult, OT consult, Speech consult 4.  Echocardiogram 5. 80 mg of Atorvistatin 6. Prophylactic therapy-Antiplatelet med: Aspirin 81 mg tomorrow 7. Risk factor modification 8. Telemetry monitoring 9. Frequent neuro checks 10 NPO until passes stroke swallow screen 11 please page stroke NP  Or  PA  Or MD from 8am -4 pm  as this patient from this time will be  followed by the stroke.   You can look them up on [email or web address]  [place]  Attending Neurohospitalist Addendum Patient seen and examined. Agree with the history and physical as documented above. Agree with the plan as documented, which I helped formulate. I have independently obtaining history, and review of systems on my exam and the patient and reviewed the chart including pertinent labs and pertinent imaging.    Assessment: 76 year old man with sudden onset of right sided homonymous hemianopsia, seen by telemedicine neurology over at [facility], status post IV TPA for disabling symptoms of the hemianopsia, transferred to [facility] for post TPA care.  Symptoms most likely related to an acute ischemic stroke embolic in nature versus metastases although CT scan of the  head was unremarkable for any masses or metastases.  Received TPA at [facility] via telemedicine neurology consult.  No large vessel occlusion on CTA hence not a candidate for endovascular therapy.  Acute Ischemic Stroke Acuity: Acute Current Suspected Etiology: Embolic Continue Evaluation:  -Admit to: Neurological ICU -Blood pressure control, goal of SYS <180 -MRI/ECHO/A1C/Lipid panel. -Hyperglycemia management per SSI to maintain glucose 140-180mg /dL. -PT/OT/ST therapies and recommendations when able  CNS -Close neuro monitoring  Dysarthria Dysphagia following cerebral infarction  -Has PEG tube.  To remain n.p.o. -PT/OT  RESP -No active issues-monitor clinically  CV Essential (primary) hypertension -Aggressive BP control, goal SBP < 180 -TTE  Hyperlipidemia,  unspecified  - Statin for goal LDL < 70  HEME Iron Deficiency Anemia -Monitor -transfuse for hgb < 7  History of metastatic squamous cell cancer of buccal mucosa -Consider oncology consult in the morning regarding chemotherapy infusions  Coagulopathy secondary to tPA at [facility] -Trend PT/PTT/INR  ENDO Type 2 diabetes mellitus with hyperglycemia  -SSI  -goal HgbA1c < 7  GI/GU -Gentle hydration -avoid nephrotoxic agents   Fluid/Electrolyte Disorders -Replete as necessary -Repeat labs   ID Possible Aspiration PNA -CXR -NPO -Monitor   Prophylaxis DVT:  scd GI: na Bowel: doc/senna   Diet: NPO until cleared by speech  Code Status: Full Code   THE FOLLOWING WERE PRESENT ON ADMISSION: CNS -  Acute Ischemic Stroke Respiratory - Probable Aspiration Pneumonia Cancer - Primary site- oral  --- Amie [place], MD [facility] [phone]  If 7pm to 7am, please call on call as listed on AMION.    CRITICAL CARE ATTESTATION This patient is critically ill and at significant risk of neurological worsening, death and care requires constant monitoring of vital signs, hemodynamics,respiratory and cardiac monitoring, neurological assessment, discussion with family, other specialists and medical decision making of high complexity. I spent 30  minutes of neurocritical care time  in the care of  this patient.

## 2016-11-28 NOTE — Assessment & Plan Note (Signed)
At the time of dictation, right after his port was accessed for chemotherapy, the patient developed sudden loss of right eye vision The patient is noted to be intermittently bradycardic Due to significant cardiovascular risk factors and concern for acute stroke, chemotherapy plan is abandoned and the patient is directed to the emergency department for urgent evaluation

## 2016-11-28 NOTE — Assessment & Plan Note (Signed)
The patient has intermittent rib pain For now, we will continue tramadol as needed for pain

## 2016-11-28 NOTE — Progress Notes (Signed)
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 Tumor measures 0.9 cm in greatest dimension. Tumor extends to the inked deep tissue edge. Depth of invasion: 4 mm (in this material) See note.  Note: an immunostains for P16 was performed at an outside hospital and provided for [place] to review; p16 immunostain is negative. Per outside report, In situ hybridization for high-risk HPV types showed no evidence of transcriptionally active high-risk HPV types.      03/21/2016 Initial Diagnosis    Salient findings:  -EAC's clear -Lesion within left buccal mucosa (pictured below) with about 22mm depth. Width is about 2/3 of the left buccal mucosal surface area by palpation. Extends to the oral commissure.  -Tongue soft and mobile without mucosal lesions -Neck soft with no palpable neck masses -Right handed -Right hand is pink and warm with Allen's test negative (normal)  -Left forearm very sun damaged with multiple treated areas from precancerous or cancerous areas. Right forearm not as dramatic, presumably from sun exposure while driving      7/37/1062 Surgery    RESECTION OF ORAL BUCCAL MUCOSA SELECTIVE NECK DISSECTION FREE FLAP RADIAL FOREARM PR SPLIT [place] <100 SQCM  [15100] (SPLIT THICKNESS SKIN GRAFT LEG        04/13/2016 Pathology Results    A.LEFT ORAL COMMISSURE INFERIOR, EXCISION: No malignancy identified.  B.LEFT ORAL COMMISSURE SUPERIOR, EXCISION: No malignancy identified.  C.LEFT DEEP ORAL COMMISSURE, EXCISION: No malignancy identified.  D.RIGHT POSTERIOR BUCCAL INFERIOR, EXCISION: No malignancy identified.  E.RIGHT POSTERIOR BUCCAL SUPERIOR, EXCISION: No malignancy identified.  F.ORAL CAVITY, LEFT BUCCAL, EXCISION:  Invasive squamous cell carcinoma, moderately differentiated,  keratinizing. Tumor size:2.2 cm. Tumor depth of invasion:0.8 cm. Perineural invasion identified. Margins negative for malignancy. Pathologic stage:pT2 pN2a. See tumor protocol summary below.  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 Tumor deposit size:1.1 cm. Extranodal extension present. Benign salivary gland tissue.  SURGICAL PATHOLOGY CANCER CASE SUMMARY (AJCC 8TH EDITION) LIP AND ORAL CAVITY CAP Protocol posting date: June, 2017 Version: LipOralCavity 4.0.0.0  PROCEDURE: Wide resection of buccal mucosa TUMOR SITE:Oral:Buccal mucosa TUMOR LATERALITY: Left TUMOR FOCALITY: Unifocal TUMOR SIZE:Greatest dimension: 2.2 cm TUMOR DEPTH OF INVASION (DOI):8 mm HISTOLOGIC TYPE:Squamous cell carcinoma, conventional HISTOLOGIC GRADE:G2: Moderately differentiated SPECIMEN MARGINS:Uninvolved by invasive carcinoma  DISTANCE FROM CLOSEST MARGIN:1 mm  SPECIFY MARGIN(S):Deep LYMPHOVASCULAR INVASION:Present PERINEURAL INVASION: Present REGIONAL LYMPH NODES: NUMBER OF LYMPH NODES INVOLVED: 1 NUMBER OF LYMPH NODES EXAMINED: 4 LATERALITY OF LYMPH NODES INVOLVED: Ipsilateral SIZE OF LARGEST METASTATIC DEPOSIT:1.1 cm EXTRANODAL  EXTENSION:Present PATHOLOGIC STAGE CLASSIFICATION (pTNM, AJCC 8th Ed): pT2 pN2a pT2: umor >2 cm but <=4 cm and <=10 mm DOI pN2a:Metastasis in a single ipsilateral lymph node, 3 cm or  smaller in greatest dimension and ENE(+) BIOMARKERS:performed on prior outside biopsy [facility] review case [phone], outside case [id number]) P16 (Immunohistochemistry):Negative HPV (high-risk types by in situ hybridization):Reported as negative  05/11/2016 Imaging    1. 4 mm left lower lobe pulmonary nodule. Attention on follow-up imaging recommended as metastatic disease not excluded. 2. 2.8 cm left thyroid nodule. Thyroid ultrasound recommended to further evaluate. 3. Bilateral renal cysts. 4. Coronary artery and thoracoabdominal aortic atherosclerosis. 5. Ankylosing spondylitis.        05/11/2016 Imaging    CT neck: Surgical clips in the region of the left buccal mucosa, presumably at the previous primary site. Mild scarring in that region measuring about 8 mm. Cannot assess for residual or recurrent disease in that location, but this may simply be scarring. Previous left submandibular resection and left neck node dissection. No abnormal nodes presently. Multinodular goiter. Ankylosing spondylitis.  C1-2 remains a mobile segment.      05/21/2016 Procedure    1. Successful placement of a right internal jugular approach power injectable Port-A-Cath. The Port a catheter is ready for immediate use. 2. Successful fluoroscopic insertion of a 20-French pull-through gastrostomy tube. The gastrostomy may be used immediately for medication administration and may be utilized in 24 hrs for the initiation of feeds.      05/23/2016 - 07/09/2016 Radiation Therapy    Site/dose:     Left Cheek and bilateral neck / 60 Gy in 30 fractions to gross disease, 54 Gy in 30 fractions to intermediate risk nodal echelons Beams/energy:   IMRT / 6 MV photons       05/25/2016 - 06/15/2016 Chemotherapy    He  received weekly reduced dose cisplatin       06/21/2016 Adverse Reaction    Cycle 5 is delayed due to pancytopenia      06/26/2016 - 06/29/2016 Hospital Admission    He was admitted for management of neutropenic fever      08/27/2016 Imaging    1. Negative for pneumothorax or pleural effusion 2. Acute right eighth rib fracture. Questionable fracture deformity of the right ninth rib.      09/04/2016 Imaging    Left-sided rib fractures, without pneumothorax or pleural fluid      09/27/2016 Imaging    CT chest 1. Multiple (at least 6) scattered solid pulmonary nodules throughout both lungs, all new since 05/11/2016 chest CT and increased since 08/27/2016 chest CT, compatible with growing pulmonary metastases. 2. No thoracic adenopathy. 3. Additional findings include stable multinodular goiter and cholelithiasis.  Aortic Atherosclerosis (ICD10-I70.0).      09/27/2016 Imaging    1. Unchanged appearance of the left buccal mucosa resection site without evidence of disease recurrence or progression. 2. Unchanged cervical ankylosis. 3. Mild carotid and aortic atherosclerosis (ICD10-I70.0).      10/16/2016 Imaging    No evidence of active pulmonary disease.  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       11/26/2016 PET scan    1. Bilateral hypermetabolic enlarging nodules consistent with metastatic pulmonary nodules. 2. 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 Second metastatic lesion in the RIGHT iliac wing. 3. No evidence of local recurrence within the oral cavity       INTERVAL HISTORY: Please see below for problem oriented charting. He returns with his wife for further follow-up He did complain of intermittent rib pain, stable He is using his feeding tube and was able to maintain his weight No recent nausea or vomiting He has stable colitis  REVIEW OF  SYSTEMS:   Constitutional: Denies fevers, chills or abnormal weight loss Eyes: Denies blurriness of  vision Ears, nose, mouth, throat, and face: Denies mucositis or sore throat Respiratory: Denies cough, dyspnea or wheezes Cardiovascular: Denies palpitation, chest discomfort or lower extremity swelling Gastrointestinal:  Denies nausea, heartburn or change in bowel habits Skin: Denies abnormal skin rashes Lymphatics: Denies new lymphadenopathy or easy bruising Neurological:Denies numbness, tingling or new weaknesses Behavioral/Psych: Mood is stable, no new changes  All other systems were reviewed with the patient and are negative.  I have reviewed the past medical history, past surgical history, social history and family history with the patient and they are unchanged from previous note.  ALLERGIES:  has No Known Allergies.  MEDICATIONS:  Current Outpatient Medications  Medication Sig Dispense Refill  . aspirin EC 81 MG tablet Take 81 mg by mouth daily.    Marland Kitchen atorvastatin (LIPITOR) 80 MG tablet Take 80 mg by mouth at bedtime.    . balsalazide (COLAZAL) 750 MG capsule TAKE 3 CAPSULES 3 TIMES A DAY. OPEN CAPS & SPRINKLE ON APPLESAUCE OR DISSOLVE IN WATER & TAKE VIA TUBE. 270 capsule 0  . cyanocobalamin (,VITAMIN B-12,) 1000 MCG/ML injection INJECT 1ML ONCE MONTHLY. 3 mL 1  . isosorbide mononitrate (ISMO,MONOKET) 20 MG tablet Crush 2 tablets (40 mg total) by mouth 2 (two) times daily at 10 AM and 5 PM. 120 tablet 11  . magic mouthwash w/lidocaine SOLN Take 10 mLs by mouth 4 (four) times daily as needed for mouth pain. 240 mL 0  . mercaptopurine (PURINETHOL) 50 MG tablet TAKE ONE TABLET ONCE A DAY ON AN EMPTY STOMACH 1 HR BEFORE OR 2HRS AFTER A MEAL 30 tablet 2  . metFORMIN (GLUCOPHAGE) 500 MG tablet Place 1 tablet (500 mg total) into feeding tube 2 (two) times daily with a meal. 180 tablet 1  . naproxen sodium (ANAPROX) 220 MG tablet Take 220 mg by mouth 2 (two) times daily as needed.     .  nitroGLYCERIN (NITROSTAT) 0.4 MG SL tablet Place 1 tablet (0.4 mg total) under the tongue every 5 (five) minutes as needed for chest pain. Max 3 doses. In no response call 911. 25 tablet 5  . Nutritional Supplements (FEEDING SUPPLEMENT, OSMOLITE 1.5 CAL,) LIQD Give 1 and 1/2 cans Osmolite 1.5 via PEG 4 times daily with 60 mL free water before and after bolus feedings. In addition, drink or flush PEG with 240 mL free water TID. Send formula and supplies. 6 Bottle 0  . ondansetron (ZOFRAN) 8 MG tablet Take 1 tablet (8 mg total) by mouth every 8 (eight) hours as needed for nausea. 30 tablet 3  . polyethylene glycol (MIRALAX / GLYCOLAX) packet Take 17 g by mouth daily as needed for moderate constipation.    . predniSONE (DELTASONE) 5 MG tablet TAKE 2 TABLETS IN THE MORNING WITH BREAKFAST. 60 tablet 0  . prochlorperazine (COMPAZINE) 10 MG tablet Take 1 tablet (10 mg total) by mouth every 6 (six) hours as needed for nausea or vomiting. 30 tablet 0  . traMADol (ULTRAM) 50 MG tablet Take 1 tablet (50 mg total) by mouth every 6 (six) hours as needed for moderate pain. 90 tablet 0   No current facility-administered medications for this visit.     PHYSICAL EXAMINATION: ECOG PERFORMANCE STATUS: 1 - Symptomatic but completely ambulatory  Vitals:   11/28/16 1047  BP: 119/66  Pulse: 93  Resp: 18  Temp: (!) 97 F (36.1 C)  SpO2: 98%   Filed Weights   11/28/16 1047  Weight: 160 lb 1.6 oz (72.6 kg)  GENERAL:alert, no distress and comfortable SKIN: skin color, texture, turgor are normal, no rashes or significant lesions EYES: normal, Conjunctiva are pink and non-injected, sclera clear OROPHARYNX:no exudate, no erythema and lips, buccal mucosa, and tongue normal  NECK: supple, thyroid normal size, non-tender, without nodularity LYMPH:  no palpable lymphadenopathy in the cervical, axillary or inguinal LUNGS: clear to auscultation and percussion with normal breathing effort HEART: regular rate &  rhythm and no murmurs and no lower extremity edema ABDOMEN:abdomen soft, non-tender and normal bowel sounds Musculoskeletal:no cyanosis of digits and no clubbing  NEURO: alert & oriented x 3 with fluent speech, no focal motor/sensory deficits  LABORATORY DATA:  I have reviewed the data as listed    Component Value Date/Time   NA 135 (L) 11/28/2016 1028   K 4.2 11/28/2016 1028   CL 99 09/24/2016 1523   CO2 31 (H) 11/28/2016 1028   GLUCOSE 207 (H) 11/28/2016 1028   GLUCOSE 98 12/12/2005 0729   BUN 22.8 11/28/2016 1028   CREATININE 0.9 11/28/2016 1028   CALCIUM 9.9 11/28/2016 1028   PROT 7.1 11/28/2016 1028   ALBUMIN 3.4 (L) 11/28/2016 1028   AST 15 11/28/2016 1028   ALT 11 11/28/2016 1028   ALKPHOS 72 11/28/2016 1028   BILITOT 0.64 11/28/2016 1028   GFRNONAA >60 08/27/2016 1614   GFRAA >60 08/27/2016 1614    No results found for: SPEP, UPEP  Lab Results  Component Value Date   WBC 6.9 11/28/2016   NEUTROABS 5.5 11/28/2016   HGB 11.6 (L) 11/28/2016   HCT 35.4 (L) 11/28/2016   MCV 96.2 11/28/2016   PLT 195 11/28/2016      Chemistry      Component Value Date/Time   NA 135 (L) 11/28/2016 1028   K 4.2 11/28/2016 1028   CL 99 09/24/2016 1523   CO2 31 (H) 11/28/2016 1028   BUN 22.8 11/28/2016 1028   CREATININE 0.9 11/28/2016 1028      Component Value Date/Time   CALCIUM 9.9 11/28/2016 1028   ALKPHOS 72 11/28/2016 1028   AST 15 11/28/2016 1028   ALT 11 11/28/2016 1028   BILITOT 0.64 11/28/2016 1028       RADIOGRAPHIC STUDIES: I have personally reviewed the radiological images as listed and agreed with the findings in the report. 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 Patient status post chemo radiation therapy. EXAM: NUCLEAR MEDICINE PET SKULL BASE TO THIGH TECHNIQUE: 7.6 mCi F-18 FDG was injected intravenously.  Full-ring PET imaging was performed from the skull base to thigh after the radiotracer. CT data was obtained and used for attenuation correction and anatomic localization. FASTING BLOOD GLUCOSE:  Value: 264 mg/dl COMPARISON:  CT chest 09/27/2016 FINDINGS: NECK No residual hypermetabolic activity in the oral cavity. No hypermetabolic lymph nodes in the neck. Postsurgical change in the LEFT submandibular neck. CHEST There are several solid pulmonary nodules increased in size from comparison CT and with associated metabolic activity. For example: RIGHT upper lobe nodule measuring 13 mm with SUV max 4.5. 16  mm RIGHT lower lobe nodule with SUV max equal 6.0 19 mm nodule in the medial LEFT lower lobe with SUV max 5.5. These 3 nodules are on same image slice (image 37, series 2). ABDOMEN/PELVIS Percutaneous gastrostomy tube in the stomach. Gallstones noted. No hypermetabolic lesion in the liver and. Normal adrenal glands. No hypermetabolic abdominopelvic  nodes.  Prostate gland SKELETON Expansile metastatic lesion within the anteromedial aspect of the right eighth rib with soft tissue component measuring 3.5 by 4.0 cm and with intense peripheral metabolic activity (SUV max 7.4). Second skeletal metastasis within the RIGHT iliac wing associated with lesion lytic lesion and SUV max equal 6.6. IMPRESSION: 1. Bilateral hypermetabolic enlarging nodules consistent with metastatic pulmonary nodules. 2. 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 Second metastatic lesion in the RIGHT iliac wing. 3. No evidence of local recurrence within the oral cavity Electronically Signed   By: Suzy Bouchard M.D.   On: 11/26/2016 16:05    ASSESSMENT & PLAN:  Buccal mucosa squamous cell carcinoma (HCC) We have reviewed PET/CT scan today The patient is relatively asymptomatic from disease We will proceed with carboplatin and Taxol as discussed The patient understood the  potential risk of pancytopenia, peripheral neuropathy and infection I will see him on a weekly basis for supportive care  Cancer associated pain The patient has intermittent rib pain For now, we will continue tramadol as needed for pain  [facility] [place] I told the patient is a possibility he might become constipated during chemotherapy He will have to hold his immunosuppressive therapy if that happens  TIA due to embolism ([place]) At the time of dictation, right after his port was accessed for chemotherapy, the patient developed sudden loss of right eye vision The patient is noted to be intermittently bradycardic Due to significant cardiovascular risk factors and concern for acute stroke, chemotherapy plan is abandoned and the patient is directed to the emergency department for urgent evaluation   No orders of the defined types were placed in this encounter.  All questions were answered. The patient knows to call the clinic with any problems, questions or concerns. No barriers to learning was detected. I spent 30 minutes counseling the patient face to face. The total time spent in the appointment was 40 minutes and more than 50% was on counseling and review of test results     [name], MD 11/28/2016 12:33 PM

## 2016-11-28 NOTE — ED Triage Notes (Addendum)
Pt arrived from [facility] cancer center brought by rapid response nurses. Pt was scheduled to get a chemo treatment today. RN accessed and flushed port. When RN flushed the port, patient's vision became blurry and he was unable to see past the midline to the right in both eyes (hemiinopsia). Pt is in a-fib and cancer center RN reports HR goes from 42-80s instantly intermittently.   Cancer center RNs reported equal grips bilaterally. Right leg drift is normal per patient.

## 2016-11-28 NOTE — ED Notes (Signed)
Patient transported to MRI 

## 2016-11-28 NOTE — Assessment & Plan Note (Signed)
I told the patient is a possibility he might become constipated during chemotherapy He will have to hold his immunosuppressive therapy if that happens

## 2016-11-29 ENCOUNTER — Inpatient Hospital Stay (HOSPITAL_COMMUNITY): Payer: PPO

## 2016-11-29 ENCOUNTER — Ambulatory Visit: Payer: PPO | Admitting: Hematology and Oncology

## 2016-11-29 ENCOUNTER — Telehealth: Payer: Self-pay | Admitting: *Deleted

## 2016-11-29 ENCOUNTER — Other Ambulatory Visit: Payer: Self-pay | Admitting: Nurse Practitioner

## 2016-11-29 ENCOUNTER — Other Ambulatory Visit: Payer: PPO

## 2016-11-29 ENCOUNTER — Other Ambulatory Visit: Payer: Self-pay

## 2016-11-29 DIAGNOSIS — I639 Cerebral infarction, unspecified: Secondary | ICD-10-CM

## 2016-11-29 DIAGNOSIS — I63432 Cerebral infarction due to embolism of left posterior cerebral artery: Principal | ICD-10-CM

## 2016-11-29 DIAGNOSIS — C060 Malignant neoplasm of cheek mucosa: Secondary | ICD-10-CM

## 2016-11-29 DIAGNOSIS — C7951 Secondary malignant neoplasm of bone: Secondary | ICD-10-CM

## 2016-11-29 DIAGNOSIS — I480 Paroxysmal atrial fibrillation: Secondary | ICD-10-CM

## 2016-11-29 DIAGNOSIS — C7800 Secondary malignant neoplasm of unspecified lung: Secondary | ICD-10-CM

## 2016-11-29 DIAGNOSIS — I361 Nonrheumatic tricuspid (valve) insufficiency: Secondary | ICD-10-CM

## 2016-11-29 LAB — LIPID PANEL
Cholesterol: 104 mg/dL (ref 0–200)
HDL: 41 mg/dL (ref >40)
LDL CALC: 47 mg/dL (ref 0–99)
TRIGLYCERIDES: 80 mg/dL (ref <150)
Total CHOL/HDL Ratio: 2.5 RATIO
VLDL: 16 mg/dL (ref 0–40)

## 2016-11-29 LAB — HEMOGLOBIN A1C
HEMOGLOBIN A1C: 7.4 % — AB (ref 4.8–5.6)
Mean Plasma Glucose: 165.68 mg/dL

## 2016-11-29 LAB — MAGNESIUM
MAGNESIUM: 2.2 mg/dL (ref 1.7–2.4)
Magnesium: 1.6 mg/dL — ABNORMAL LOW (ref 1.7–2.4)

## 2016-11-29 LAB — ECHOCARDIOGRAM COMPLETE
HEIGHTINCHES: 68 in
WEIGHTICAEL: 2504.43 [oz_av]

## 2016-11-29 LAB — PHOSPHORUS
PHOSPHORUS: 2.4 mg/dL — AB (ref 2.5–4.6)
Phosphorus: 3.1 mg/dL (ref 2.5–4.6)

## 2016-11-29 LAB — GLUCOSE, CAPILLARY
GLUCOSE-CAPILLARY: 89 mg/dL (ref 65–99)
GLUCOSE-CAPILLARY: 182 mg/dL — AB (ref 65–99)
Glucose-Capillary: 193 mg/dL — ABNORMAL HIGH (ref 65–99)
Glucose-Capillary: 223 mg/dL — ABNORMAL HIGH (ref 65–99)

## 2016-11-29 MED ORDER — INSULIN ASPART 100 UNIT/ML ~~LOC~~ SOLN
0.0000 [IU] | SUBCUTANEOUS | Status: DC
  Administered 2016-11-29: 3 [IU] via SUBCUTANEOUS
  Administered 2016-11-29: 2 [IU] via SUBCUTANEOUS
  Administered 2016-11-30: 1 [IU] via SUBCUTANEOUS
  Administered 2016-11-30: 2 [IU] via SUBCUTANEOUS
  Administered 2016-11-30: 5 [IU] via SUBCUTANEOUS
  Administered 2016-11-30: 1 [IU] via SUBCUTANEOUS

## 2016-11-29 MED ORDER — FREE WATER
120.0000 mL | Freq: Four times a day (QID) | Status: DC
  Administered 2016-11-29 – 2016-11-30 (×4): 120 mL

## 2016-11-29 MED ORDER — OSMOLITE 1.5 CAL PO LIQD
360.0000 mL | Freq: Four times a day (QID) | ORAL | Status: DC
  Administered 2016-11-29 – 2016-11-30 (×5): 360 mL
  Filled 2016-11-29 (×9): qty 474

## 2016-11-29 MED ORDER — ASPIRIN EC 81 MG PO TBEC
81.0000 mg | DELAYED_RELEASE_TABLET | Freq: Every day | ORAL | Status: DC
  Administered 2016-11-29: 81 mg via ORAL
  Filled 2016-11-29: qty 1

## 2016-11-29 MED ORDER — ATORVASTATIN CALCIUM 80 MG PO TABS
80.0000 mg | ORAL_TABLET | Freq: Every day | ORAL | Status: DC
  Administered 2016-11-29: 80 mg via ORAL
  Filled 2016-11-29: qty 1

## 2016-11-29 MED ORDER — ORAL CARE MOUTH RINSE
15.0000 mL | Freq: Two times a day (BID) | OROMUCOSAL | Status: DC
  Administered 2016-11-29 (×2): 15 mL via OROMUCOSAL

## 2016-11-29 MED ORDER — PRO-STAT SUGAR FREE PO LIQD
30.0000 mL | Freq: Two times a day (BID) | ORAL | Status: DC
  Administered 2016-11-29 – 2016-11-30 (×2): 30 mL
  Filled 2016-11-29 (×2): qty 30

## 2016-11-29 MED ORDER — CHLORHEXIDINE GLUCONATE 0.12 % MT SOLN
15.0000 mL | Freq: Two times a day (BID) | OROMUCOSAL | Status: DC
  Administered 2016-11-29 – 2016-11-30 (×3): 15 mL via OROMUCOSAL
  Filled 2016-11-29 (×3): qty 15

## 2016-11-29 MED ORDER — MAGNESIUM SULFATE 2 GM/50ML IV SOLN
2.0000 g | Freq: Once | INTRAVENOUS | Status: AC
  Administered 2016-11-29: 2 g via INTRAVENOUS
  Filled 2016-11-29: qty 50

## 2016-11-29 NOTE — Telephone Encounter (Signed)
"  Candise Che NP with neurology department needing to talk with Dr.Gorsuch directly about this patient.  My return number is [phone]."  Delivering to provider.

## 2016-11-29 NOTE — Progress Notes (Signed)
Initial Nutrition Assessment  DOCUMENTATION CODES:   Severe malnutrition in context of chronic illness  INTERVENTION:   1.5 can (360 ml) Osmolite 1.5 QID 30 ml Prostat BID 60 ml free water before and after each feeding  Provides: 2330 kcal, 119 grams protein, and 1086 ml free water.  Total free water: 1566 ml  Once IVF's are d/c'ed recommend: 240 ml free water TID    NUTRITION DIAGNOSIS:   Severe Malnutrition related to chronic illness(metastatic cancer) as evidenced by severe fat depletion, severe muscle depletion.  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 Pt with acute ischemic stroke now s/p tPA.    Pt reports that he has had his feeding tube since May 2018. He feels that he is still losing weight. He reports getting in at least 6 bottles of Osmolite 1.5 per day and is not having any issues with his feeding.  Pt reports that he does eat and has no problems chewing but does have issues swallowing if the food gets pasty. He only eats a few bites at a time and therefore has not contributed much for kcal and protein. He states that he has no issues with diarrhea or constipation but does have stools that tend to be loose.  Pt can drink water, this was offered to pt as he was c/o dry mouth.  Per wife they are running out of TF at home, notified cancer center RD.   Medications reviewed and include: novolog, cardene NS @ 75 ml/hr Labs reviewed: Na 133 (L), glucose 165    NUTRITION - FOCUSED PHYSICAL EXAM:    Most Recent Value  Orbital Region  Severe depletion  Upper Arm Region  Severe depletion   Thoracic and Lumbar Region  Severe depletion  Buccal Region  Severe depletion  Temple Region  Severe depletion  Clavicle Bone Region  Severe depletion  Clavicle and Acromion Bone Region  Mild depletion  Scapular Bone Region  Unable to assess  Dorsal Hand  Moderate depletion  Patellar Region  Moderate depletion  Anterior Thigh Region  Moderate depletion  Posterior Calf Region  Mild depletion  Edema (RD Assessment)  None  Hair  Reviewed  Eyes  Reviewed  Mouth  Reviewed [dry]  Skin  Reviewed  Nails  Reviewed      Diet Order:  DIET SOFT Room service appropriate? Yes; Fluid consistency: Thin  EDUCATION NEEDS:   No education needs have been identified at this time  Skin:  Skin Assessment: Reviewed RN Assessment  Last BM:  11/28  Height:   Ht Readings from Last 1 Encounters:  11/28/16 5\' 8"  (1.727 m)    Weight:   Wt Readings from Last 1 Encounters:  11/28/16 156 lb 8.4 oz (71 kg)    Ideal Body Weight:  70 kg  BMI:  Body mass index is 23.8 kg/m.  Estimated Nutritional Needs:   Kcal:  2300-2500  Protein:  100-115 grams  Fluid:  > 2.3 L/day  Maylon Peppers RD, LDN, CNSC [phone] Pager [phone] After Hours Pager

## 2016-11-29 NOTE — Progress Notes (Signed)
OT Cancellation   11/29/16 1600  OT Visit Information  Last OT Received On 11/29/16  Reason Eval/Treat Not Completed Patient at procedure or test/ unavailable (vascular)   [facility], OT/L  [phone] 11/29/2016

## 2016-11-29 NOTE — Progress Notes (Signed)
Andres Smith   DOB:September 19, 1940   PJ#:[id number]    Subjective: The patient was admitted to the hospital urgently yesterday after acute right hemianopsia.  He was subsequently transferred to [facility].  MRI showed large occipital infarct.  Patient is currently managed medically. Received TPA Further consultation with cardiologist is pending. For now, he is to have right hemianopsia.  He denies headache, other weakness.  He denies syncopal episode, chest pain or shortness of breath recently  Objective:  Vitals:   11/29/16 1300 11/29/16 1400  BP: (!) 137/108 (!) 158/146  Pulse: 66 90  Resp: 17 19  Temp:    SpO2: 100% 100%     Intake/Output Summary (Last 24 hours) at 11/29/2016 1555 Last data filed at 11/29/2016 1436 Gross per 24 hour  Intake 1458.75 ml  Output 2225 ml  Net -766.25 ml    GENERAL:alert, no distress and comfortable SKIN: skin color, texture, turgor are normal, no rashes or significant lesions EYES: normal, Conjunctiva are pink and non-injected, sclera clear Musculoskeletal:no cyanosis of digits and no clubbing  NEURO: alert & oriented x 3 with fluent speech, no focal motor/sensory deficits   Labs:  Lab Results  Component Value Date   WBC 7.1 11/28/2016   HGB 10.3 (L) 11/28/2016   HCT 30.9 (L) 11/28/2016   MCV 95.1 11/28/2016   PLT 154 11/28/2016   NEUTROABS 6.3 11/28/2016    Lab Results  Component Value Date   NA 133 (L) 11/28/2016   K 4.2 11/28/2016   CL 95 (L) 11/28/2016   CO2 29 11/28/2016    Studies:  Ct Angio Head W Or Wo Contrast  Result Date: 11/28/2016 CLINICAL DATA:  76 year old male acute onset right side hemianopia today. History of prior bilateral cerebellar infarcts. Symptom onset when the patient presented to the cancer center for chemotherapy. EXAM: CT ANGIOGRAPHY HEAD TECHNIQUE: Multidetector CT imaging of the head was performed using the standard protocol during bolus administration of intravenous contrast. Multiplanar CT image  reconstructions and MIPs were obtained to evaluate the vascular anatomy. CONTRAST:  97mL ISOVUE-370 IOPAMIDOL (ISOVUE-370) INJECTION 76% COMPARISON:  Head CT without contrast 1322 hours today. FINDINGS: Posterior circulation: Codominant distal vertebral arteries are patent to the vertebrobasilar junction. There is calcified right V4 segment plaque with mild stenosis. There is noncalcified mild to moderate stenosis of the left V4 segment (series 10, image 26). Right PICA origin an dominant appearing left AICA origin are patent. Patent vertebrobasilar junction. Mild to moderate irregularity and stenosis of the midbasilar artery. The distal basilar is patent. SCA (duplicated, normal variant) and PCA origins appear normal. Posterior communicating arteries are diminutive or absent. The left PCA P1, P2 segments and bifurcation is not patent and normal. The left P3 branches appear within normal limits. Contralateral right PCA branches appear normal. Anterior circulation: Distal cervical ICAs are patent. Tortuous bilateral ICA siphons are patent with mild to moderate left and moderate to severe right side calcified atherosclerosis. There is moderate to severe right ICA supraclinoid segment stenosis. No significant stenosis on the left. Both ophthalmic artery origins are patent. Bilateral carotid termini are patent. MCA and ACA origins are normal. Anterior communicating artery and bilateral ACA branches are within normal limits. Left MCA M1 segment, left MCA trifurcation and left MCA branches are within normal limits. Right MCA M1 segment, bifurcation, and right MCA branches are within normal limits. Venous sinuses: Patent. Anatomic variants: None. Delayed phase: Stable gray-white matter differentiation throughout the brain. No cortically based acute infarct identified. No  acute intracranial hemorrhage identified. No midline shift, mass effect, or evidence of intracranial mass lesion. No abnormal enhancement identified.  There is probably a small area of chronic encephalomalacia in the posterior right MCA territory, right peri-Rolandic cortex on series 16, image 22 which appears stable since 06/26/2016. Review of the MIP images confirms the above findings IMPRESSION: 1. Negative for emergent large intracranial vessel occlusion. Dr. Lovenia Kim gave a preliminary report of this finding to Dr. Jerelyn Charles. 2. Posterior circulation atherosclerosis including multifocal mild to moderate bilateral distal vertebral and basilar artery stenosis. The left PCA appears normal. 3. Stable CT appearance of the brain from earlier today. No acute or evolving infarct identified. 4. Bulky right ICA siphon calcified atherosclerosis with moderate or severe supraclinoid ICA stenosis. 5. Elsewhere the anterior circulation is within normal limits. Electronically Signed   By: Genevie Ann M.D.   On: 11/28/2016 15:09   Mr Brain Wo Contrast  Result Date: 11/29/2016 CLINICAL DATA:  RIGHT hemianopsia.  Acute onset. EXAM: MRI HEAD WITHOUT CONTRAST TECHNIQUE: Multiplanar, multiecho pulse sequences of the brain and surrounding structures were obtained without intravenous contrast. COMPARISON:  CT head 11/28/2016.  CTA head 11/28/2016. FINDINGS: Brain: Large area of restricted diffusion involves much of the LEFT occipital lobe, including the calcarine cortex, corresponding low ADC, consistent with acute infarction. No mass lesion, hemorrhage, hydrocephalus, or extra-axial fluid. Generalized atrophy. Chronic microvascular ischemic change. Scattered areas of chronic lacunar infarction affect the cerebral and cerebellar hemispheres. Vascular: Flow voids are maintained, specifically in the BILATERAL vertebral and basilar arteries. LEFT PCA appears patent as well. Skull and upper cervical spine: Post treatment marrow changes of the skull base and upper cervical region. Sinuses/Orbits: No layering fluid or significant opacity. Normal orbits. Other: Unremarkable.  IMPRESSION: Large acute LEFT PCA territory infarct affecting much of the LEFT occipital lobe. No superimposed hemorrhage is evident. Electronically Signed   By: Staci Righter M.D.   On: 11/29/2016 12:05   Ct Head Code Stroke Wo Contrast  Result Date: 11/28/2016 CLINICAL DATA:  Code stroke. 76 year old male scheduled for chemotherapy today, acute onset loss of right side vision in both eyes. A fib. EXAM: CT HEAD WITHOUT CONTRAST TECHNIQUE: Contiguous axial images were obtained from the base of the skull through the vertex without intravenous contrast. COMPARISON:  Neck CT 09/27/2016.  Head CT 06/26/2016 and earlier. FINDINGS: Brain: Stable cerebral volume. No acute intracranial hemorrhage identified. No midline shift, mass effect, or evidence of intracranial mass lesion. No ventriculomegaly. 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 Comparatively mild patchy bilateral white matter hypodensity appears stable. No cortically based acute infarct identified. Vascular: Calcified atherosclerosis at the skull base. No suspicious intracranial vascular hyperdensity. Skull: Degenerative changes in the upper cervical spine. No acute osseous abnormality identified. Sinuses/Orbits: Clear. Other: Orbits soft tissues appear stable and normal. Visualized scalp soft tissues are within normal limits. ASPECTS [facility] Stroke Program Early CT Score) - Ganglionic level infarction (caudate, lentiform nuclei, internal capsule, insula, M1-M3 cortex): 7 - Supraganglionic infarction (M4-M6 cortex): 3 Total score (0-10 with 10 being normal): 10 IMPRESSION: 1. No acute cortically based infarct or acute intracranial hemorrhage identified. 2. ASPECTS is 10. 3. 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 4. Study discussed by telephone with Dr. Carmin Muskrat on 11/28/2016 at 13:39 . Electronically Signed   By: Genevie Ann M.D.   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 I would recommend keeping his port for now  Discharge planning Will defer to primary service He has appointment to see me next Wednesday  [name], MD 11/29/2016  3:55 PM

## 2016-11-29 NOTE — Progress Notes (Signed)
Asked by Dr Leonie Man to review telemetry. No clear AF, SR with frequent PAC's.  Discussed with Dr Burt Knack who knows the patient well. Will plan for 30 day monitor and follow up in 4-6 weeks to review results.   Chanetta Marshall, NP 11/29/2016 7:30 PM

## 2016-11-29 NOTE — Progress Notes (Signed)
STROKE TEAM PROGRESS NOTE  Admission History: Andres Smith is an 76 y.o. 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  Patient was receiving chemotherapy infusion, had just received his heparin flush, when he felt onset of vision loss.  Due to these findings he was given TPA and CTA of head and neck were obtained.  CTA did not show stenosis to account for his symptoms.  On arrival to ED - he continues to have right hemianopsia with no other symptoms.   At baseline he has PEG tube for dysphagia and right foot drop.  Date last known well: Date: 11/28/2016 Time last known well: Time: 12:10 tPA Given: Yes NIHSS2 Modified Rankin: Rankin Score=0  SUBJECTIVE (INTERVAL HISTORY) Son is at the bedside. Patient is found laying in bed in NAD  Overall he feels his condition is unchanged. Continues to complain of vision loss. Voices no new complaints. No new events reported overnight. Admits to medication compliance with ASA at home.  OBJECTIVE Lab Results: CBC:  Recent Labs  Lab 11/28/16 1028 11/28/16 1333  WBC 6.9 7.1  HGB 11.6* 10.3*  HCT 35.4* 30.9*  MCV 96.2 95.1  PLT 195 154   BMP: Recent Labs  Lab 11/28/16 1028 11/28/16 1333 11/29/16 1215  NA 135* 133*  --   K 4.2 4.2  --   CL  --  95*  --   CO2 31* 29  --   GLUCOSE 207* 205*  --   BUN 22.8 23*  --   CREATININE 0.9 0.70  --   CALCIUM 9.9 9.1  --   MG  --   --  1.6*  PHOS  --   --  3.1   Liver Function Tests:  Recent Labs  Lab 11/28/16 1028 11/28/16 1333  AST 15 21  ALT 11 14*  ALKPHOS 72 68  BILITOT 0.64 0.6  PROT 7.1 6.3*  ALBUMIN 3.4* 3.3*   Coagulation Studies:  Recent Labs    11/28/16 1333  APTT 27  INR 0.97   Urinalysis:  Recent Labs  Lab 11/28/16 Fate 1.013  PHURINE 8.0  GLUCOSEU 50*  HGBUR NEGATIVE  BILIRUBINUR NEGATIVE  KETONESUR NEGATIVE  PROTEINUR NEGATIVE  NITRITE NEGATIVE  LEUKOCYTESUR NEGATIVE   Urine Drug Screen:     Component Value Date/Time   LABOPIA NONE DETECTED 11/28/2016 1758   COCAINSCRNUR NONE DETECTED 11/28/2016 1758   LABBENZ NONE DETECTED 11/28/2016 1758   AMPHETMU NONE DETECTED 11/28/2016 1758   THCU NONE DETECTED 11/28/2016 1758   LABBARB NONE DETECTED 11/28/2016 1758    PHYSICAL EXAM Temp:  [97.9 F (36.6 C)-98.3 F (36.8 C)] 98.1 F (36.7 C) (11/29 1200) Pulse Rate:  [40-99] 90 (11/29 1400) Resp:  [8-31] 19 (11/29 1400) BP: (110-162)/(56-146) 158/146 (11/29 1400) SpO2:  [95 %-100 %] 100 % (11/29 1400) Weight:  [156 lb 8.4 oz (71 kg)] 156 lb 8.4 oz (71 kg) (11/28 1945) General - Well nourished, well developed, in no apparent distress Respiratory - Lungs clear bilaterally. No wheezing. Cardiovascular - Irregular rate and rhythm   Neurological Examination Mental Status: Alert, oriented, thought content appropriate.  Speech fluent without evidence of aphasia.  Able to follow 3 step commands without difficulty. Diminished recall 2/3 Cranial Nerves: II: right dense homonymous hemianopsia III,IV,  VI: ptosis not present, extra-ocular motions intact bilaterally, pupils equal, round, reactive to light and accommodation V,VII: smile symmetric, facial light touch sensation normal bilaterally VIII: hearing normal bilaterally IX,X: uvula rises symmetrically XI: bilateral shoulder shrug XII: midline tongue extension Motor: Right :  Upper extremity   5/5                                      Left:     Upper extremity   5/5             Lower extremity   5/5   - 3/5 Ankle                               Lower extremity   5/5 Right foot drop Tone and bulk:normal tone throughout; no atrophy noted Sensory: Pinprick and light touch intact throughout,  bilaterally Deep Tendon Reflexes: 2+ and symmetric throughout Plantars: Right: downgoing                                Left: downgoing Cerebellar: normal finger-to-nose,  and normal heel-to-shin test Gait: not tested   IMAGING: I have personally reviewed the radiological images below and agree with the radiology interpretations. Ct Angio Head W Or Wo Contrast Result Date: 11/28/2016 IMPRESSION: 1. Negative for emergent large intracranial vessel occlusion. 2. Posterior circulation atherosclerosis including multifocal mild to moderate bilateral distal vertebral and basilar artery stenosis. The left PCA appears normal. 3. Stable CT appearance of the brain from earlier today. No acute or evolving infarct identified. 4. Bulky right ICA siphon calcified atherosclerosis with moderate or severe supraclinoid ICA stenosis. 5. Elsewhere the anterior circulation is within normal limits. Electronically Signed    Mr Brain Wo Contrast Result Date: 11/29/2016 IMPRESSION: Large acute LEFT PCA territory infarct affecting much of the LEFT occipital lobe. No superimposed hemorrhage is evident.   Ct Head Code Stroke Wo Contrast Result Date: 11/28/2016 IMPRESSION: 1. No acute cortically based infarct or acute intracranial hemorrhage identified. 2. ASPECTS is 10. 3. 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  Echocardiogram:                                                                not done          PENDING Recent Results (from the past 43800 hour(s))  ECHOCARDIOGRAM COMPLETE   Collection Time: 06/07/16 10:35 AM   Narrative                           [facility]                        [address]                        [place], [place] [address]  [phone]  Study Conclusions - Left ventricle: The cavity size was mildly dilated. Overall   systolic function appears  preserved but cannot give accurate EF   or comment on wall motion as endocardial segments are not   adequately visualized. There was an increased relative   contribution of atrial contraction to ventricular filling.   Doppler parameters are consistent with abnormal left ventricular   relaxation (grade 1 diastolic dysfunction). - Aortic valve: Trileaflet; normal thickness, moderately calcified   leaflets. Valve mobility was restricted. There was very mild   stenosis. Valve area (VTI): 1.73 cm^2. Valve area (Vmax): 1.66   cm^2. Valve area (Vmean): 1.62 cm^2. - Left atrium: The atrium was mildly dilated. - Pulmonic valve: Poorly visualized. - Pulmonary arteries: Systolic pressure could not be accurately   estimated. - Impressions: Due to poor acoustical windows, cannot comment   accurately on EF or on wall motion. Recommend repeat limited   study with definity contrast. Impressions: - Due to poor acoustical windows, cannot comment accurately on EF   or on wall motion. Recommend repeat limited study with definity   contrast.   B/L Carotid U/S:                                                PENDING _____________________________________________________________________ ASSESSMENT: Mr. JAECOB LOWDEN is a 76 y.o. 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 Initial Head CT negative, he was given TPA and CTA of head and neck were obtained. CTA did not show stenosis to account for his symptoms. On arrival to [facility] ED - he continues to have right hemianopsia with no other symptoms.    Large acute LEFT PCA - LEFT occipital lobe Infarct etiology embolic source unclear paroxysmal A. fib versus intracranial atherosclerosis Multiple, stable, chronic lacunar infarcts B/L cerebellar  hemispheres  Moderate B/L distal vertebral and basilar artery stenosis.  Bulky right ICA siphon calcified atherosclerosis with moderate or severe supraclinoid ICA stenosis.  Suspected Etiology: embolic due to New onset AFIB vs metastases  Resultant Symptoms: right homonymous hemianopsia Stroke Risk Factors: diabetes mellitus, hyperlipidemia and hypertension Other Stroke Risk Factors: Advanced age, Coronary artery disease  Outstanding Stroke Work-up Studies: Echocardiogram: not done                                    PENDING B/L Carotid U/S:                                                     PENDING  11/29/16: Neuro exam remains stable. No worsening of visual loss and no complaints of weakness on exam. AFIB noted on Cardiac monitor this AM on rounding. Will consult Cardiology to confirm Dx. Discussed with patient, son and Oncologist Dr Lanice Schwab possibility of starting Eliquis if AFIB confirmed.  PLAN  11/29/2016: Continue Aspirin/ Statin PT/OT/SLP Ongoing aggressive stroke risk factor management Patient counseled to be compliant with her antithrombotic medications May need TEE and Loop if  AFIB not confirmed on this admission  INTRACRANIAL Atherosclerosis &Stenosis: If AFIB confirmed will start Eliquis in AM Due to past history of chronic colitis and GI bleed will not add ASA   BASELINE DYSPHAGIA WITH PEG: Dietitian consulted to resume PEG feedings  New onset AFIB: EKG shows A. Fib - Will consult Cardiology for confirmation [facility] - for now, until AFIB Dx confirmed, Oncologist Dr Lanice Schwab in agreement to Eliquis if needed. Will need long-term anticoagulation if AFIB confirmed If confirmed will start Eliquis in AM  FLUID/ELECTROLYTE DISORDERS: Hypo Magnesium - replacement in progress Repeat labs in AM  ANEMIA: Hgb 10.3 & Hct 30.9 on admission No signs of active bleeding Repeat labs in AM Will continue to monitor closely transfuse for hgb <  7  HYPERTENSION: Stable Aggressive BP control, goal SBP < 180 Long term BP goal normotensive. Home Meds: NONE  HYPERLIPIDEMIA:    Component Value Date/Time   CHOL 104 11/29/2016 0717   TRIG 80 11/29/2016 0717   TRIG 54 12/07/2005 1117   HDL 41 11/29/2016 0717   CHOLHDL 2.5 11/29/2016 0717   VLDL 16 11/29/2016 0717   LDLCALC 47 11/29/2016 0717  Home Meds:  Lipitor 80 mg LDL  goal < 70 Continued on  Lipitor to 80 mg daily Continue statin at discharge  DIABETES:Type 2 diabetes mellitus with hyperglycemia  Lab Results  Component Value Date   HGBA1C 7.4 (H) 11/29/2016  HgbA1c goal < 7.0 Currently on: Novolog Continue CBG monitoring and SSI DM education   Other Active Problems: Active Problems:   Stroke (cerebrum) (HCC)   Paroxysmal atrial fibrillation [facility])  Hospital day # 1  VTE prophylaxis: SCD's  Diet : DIET SOFT Room service appropriate? Yes; Fluid consistency: Thin   Prior Home Stroke Medications: aspirin 81 mg daily and Lipitor 80 mg  Hospital Current Stroke Medications: aspirin 81 mg daily and Lipitor 80 mg Stroke New Meds Plan: Now on aspirin 81 mg daily and Lipitor 80 mg Discharge Stroke Meds: Likely on  Eliquis (apixaban) daily and Lipitor 80 mg   Disposition: 01-Home or Self Care Therapy Recs:  HOME Follow Recs:  Follow-up Information    [name], MD. Schedule an appointment as soon as possible for a visit in 2 week(s).   Specialty:  Hematology and Oncology Contact information: [place] [id number] [phone]        Garvin Fila, MD. Schedule an appointment as soon as possible for a visit in 6 week(s).   Specialties:  Neurology, Radiology Contact information: [address] [place] [address] [phone]          Janith Lima, MD- PCP follow up in 2-3 weeks  FAMILY UPDATES: Son at bedside  TEAM UPDATES: [place], [place] Stroke Neurology  Team 11/29/2016 4:44 PM I have personally examined this patient, reviewed notes, independently viewed imaging studies, participated in medical decision making and plan of care.ROS completed by me personally and pertinent positives fully documented  I have made any additions or clarifications directly to the above note. Agree with note above. He has presented with a right homonymous hemianopsia due to left posterior cerebral artery infarct likely of embolic etiology etiology indeterminate -l A. fib versus vessel to vessel embolism from intracranial atherosclerosis. Patient has history of GI bleed and may not be a good candidate for dual antiplatelet therapy at this time. Maintain strict blood pressure control as per post TPA protocol and close neurological monitoring  Continue aspirin alone for now and may switch to eliquis if A. fib is confirmed. Discussed with Dr. Alvy Bimler which, patient and son and answered question.This patient is critically ill and at significant risk of neurological worsening, death and care requires constant monitoring of vital signs, hemodynamics,respiratory and cardiac monitoring, extensive review of multiple databases, frequent neurological assessment, discussion with family, other specialists and medical decision making of high complexity.I have made any additions or clarifications directly to the above note.This critical care time does not reflect procedure time, or teaching time or supervisory time of PA/NP/Med Resident etc but could involve care discussion time.  I spent 30 minutes of neurocritical care time  in the care of  this patient.     Antony Contras, MD Medical Director [facility] Pager: [phone] 11/29/2016 4:46 PM  To contact Stroke Continuity provider, please refer to [email or web address] After hours, contact General Neurology

## 2016-11-29 NOTE — Progress Notes (Addendum)
OT Cancellation Note  Patient Details Name: Andres Smith MRN: [id number] DOB: 11-20-1940   Cancelled Treatment:    Reason Eval/Treat Not Completed: Patient at procedure or test/ unavailable/ on bedrest.  Iraan, OT/L  [phone] 11/29/2016 11/29/2016, 11:53 AM

## 2016-11-29 NOTE — Progress Notes (Signed)
PT Cancellation Note  Patient Details Name: Andres Smith MRN: [id number] DOB: 02-11-1940   Cancelled Treatment:    Reason Eval/Treat Not Completed: Medical issues which prohibited therapy(pt on strict bedrest)   [name] 11/29/2016, 7:11 AM Elwyn Reach, [place]

## 2016-11-29 NOTE — Progress Notes (Signed)
Inpatient Diabetes Program Recommendations  AACE/ADA: New Consensus Statement on Inpatient Glycemic Control (2015)  Target Ranges:  Prepandial:   less than 140 mg/dL      Peak postprandial:   less than 180 mg/dL (1-2 hours)      Critically ill patients:  140 - 180 mg/dL   Lab Results  Component Value Date   GLUCAP 271 (H) 11/26/2016   HGBA1C 7.4 (H) 11/29/2016    Review of Glycemic ControlResults for MANSOOR, HILLYARD (MRN [id number]) as of 11/29/2016 10:52  Ref. Range 11/26/2016 13:03 11/26/2016 13:11  Glucose-Capillary Latest Ref Range: 65 - 99 mg/dL 264 (H) 271 (H)    Diabetes history: Type 2 DM Outpatient Diabetes medications: Metformin 500 mg bid Current orders for Inpatient glycemic control:  None  Inpatient Diabetes Program Recommendations:    Sent text page to PA, requesting Novolog sensitive correction q 4 hours while in the hospital.  Will follow.   Thanks, Adah Perl, RN, BC-ADM Inpatient Diabetes Coordinator Pager [phone] (8a-5p)

## 2016-11-29 NOTE — Progress Notes (Signed)
*  PRELIMINARY RESULTS* Echocardiogram 2D Echocardiogram has been performed.  Andres Smith 11/29/2016, 4:35 PM

## 2016-11-29 NOTE — Care Management Note (Signed)
Case Management Note  Patient Details  Name: DELMORE SEAR MRN: [id number] Date of Birth: 05-18-1940  Subjective/Objective:  76 y.o. male with right hemianopsia S/P tPA.  PTA, pt resides at home with spouse.  At baseline he has PEG tube for dysphagia and right foot drop.                   Action/Plan: Will follow for discharge planning as pt progresses.  PT/OT consults pending.    Expected Discharge Date:                  Expected Discharge Plan:     In-House Referral:     Discharge planning Services  CM Consult  Post Acute Care Choice:    Choice offered to:     DME Arranged:    DME Agency:     HH Arranged:    HH Agency:     Status of Service:  In process, will continue to follow  If discussed at Long Length of Stay Meetings, dates discussed:    Additional Comments:  Reinaldo Raddle, RN, BSN  Trauma/Neuro ICU Case Manager [phone]

## 2016-11-30 ENCOUNTER — Inpatient Hospital Stay (HOSPITAL_COMMUNITY): Payer: PPO

## 2016-11-30 DIAGNOSIS — I63432 Cerebral infarction due to embolism of left posterior cerebral artery: Secondary | ICD-10-CM

## 2016-11-30 LAB — BASIC METABOLIC PANEL
Anion gap: 6 (ref 5–15)
BUN: 14 mg/dL (ref 6–20)
CHLORIDE: 99 mmol/L — AB (ref 101–111)
CO2: 26 mmol/L (ref 22–32)
Calcium: 8.5 mg/dL — ABNORMAL LOW (ref 8.9–10.3)
Creatinine, Ser: 0.64 mg/dL (ref 0.61–1.24)
GFR calc non Af Amer: >60 mL/min (ref >60)
Glucose, Bld: 136 mg/dL — ABNORMAL HIGH (ref 65–99)
POTASSIUM: 3.9 mmol/L (ref 3.5–5.1)
SODIUM: 131 mmol/L — AB (ref 135–145)

## 2016-11-30 LAB — CBC
HEMATOCRIT: 32.4 % — AB (ref 39.0–52.0)
HEMOGLOBIN: 11 g/dL — AB (ref 13.0–17.0)
MCH: 31.5 pg (ref 26.0–34.0)
MCHC: 34 g/dL (ref 30.0–36.0)
MCV: 92.8 fL (ref 78.0–100.0)
Platelets: 157 10*3/uL (ref 150–400)
RBC: 3.49 MIL/uL — AB (ref 4.22–5.81)
RDW: 15.6 % — ABNORMAL HIGH (ref 11.5–15.5)
WBC: 7 10*3/uL (ref 4.0–10.5)

## 2016-11-30 LAB — GLUCOSE, CAPILLARY
Glucose-Capillary: 141 mg/dL — ABNORMAL HIGH (ref 65–99)
Glucose-Capillary: 150 mg/dL — ABNORMAL HIGH (ref 65–99)
Glucose-Capillary: 294 mg/dL — ABNORMAL HIGH (ref 65–99)

## 2016-11-30 LAB — MAGNESIUM: Magnesium: 1.9 mg/dL (ref 1.7–2.4)

## 2016-11-30 LAB — PHOSPHORUS: Phosphorus: 2.7 mg/dL (ref 2.5–4.6)

## 2016-11-30 MED ORDER — METFORMIN HCL 500 MG PO TABS: 500.0000 mg | ORAL_TABLET | Freq: Two times a day (BID) | ORAL | Status: DC

## 2016-11-30 MED ORDER — PREDNISONE 10 MG PO TABS: 10.0000 mg | ORAL_TABLET | Freq: Every day | ORAL | Status: DC

## 2016-11-30 MED ORDER — MERCAPTOPURINE 50 MG PO TABS
50.0000 mg | ORAL_TABLET | Freq: Every day | ORAL | Status: DC
  Filled 2016-11-30: qty 1

## 2016-11-30 MED ORDER — ISOSORBIDE MONONITRATE 20 MG PO TABS
40.0000 mg | ORAL_TABLET | Freq: Two times a day (BID) | ORAL | Status: DC
  Filled 2016-11-30: qty 2

## 2016-11-30 MED ORDER — HEPARIN SOD (PORK) LOCK FLUSH 100 UNIT/ML IV SOLN
500.0000 [IU] | INTRAVENOUS | Status: AC | PRN
  Administered 2016-11-30: 500 [IU]

## 2016-11-30 MED ORDER — BALSALAZIDE DISODIUM 750 MG PO CAPS: 2250.0000 mg | ORAL_CAPSULE | Freq: Three times a day (TID) | ORAL | Status: DC

## 2016-11-30 MED ORDER — BALSALAZIDE DISODIUM 750 MG PO CAPS
2250.0000 mg | ORAL_CAPSULE | Freq: Three times a day (TID) | ORAL | Status: DC
  Filled 2016-11-30 (×2): qty 3

## 2016-11-30 MED ORDER — NITROGLYCERIN 0.4 MG SL SUBL: 0.4000 mg | SUBLINGUAL_TABLET | SUBLINGUAL | Status: DC | PRN

## 2016-11-30 MED ORDER — TRAMADOL HCL 50 MG PO TABS: 50.0000 mg | ORAL_TABLET | Freq: Four times a day (QID) | ORAL | Status: DC | PRN

## 2016-11-30 MED ORDER — ASPIRIN EC 325 MG PO TBEC
325.0000 mg | DELAYED_RELEASE_TABLET | Freq: Every day | ORAL | Status: DC
  Filled 2016-11-30: qty 1

## 2016-11-30 MED ORDER — CLOPIDOGREL BISULFATE 75 MG PO TABS
75.0000 mg | ORAL_TABLET | Freq: Every day | ORAL | Status: DC
  Administered 2016-11-30: 75 mg via ORAL
  Filled 2016-11-30: qty 1

## 2016-11-30 MED ORDER — ISOSORBIDE DINITRATE 20 MG PO TABS
40.0000 mg | ORAL_TABLET | Freq: Two times a day (BID) | ORAL | Status: DC
  Administered 2016-11-30: 40 mg via ORAL
  Filled 2016-11-30 (×2): qty 2

## 2016-11-30 MED ORDER — PANTOPRAZOLE SODIUM 40 MG PO TBEC: 40.0000 mg | DELAYED_RELEASE_TABLET | Freq: Every day | ORAL | Status: DC

## 2016-11-30 MED ORDER — CLOPIDOGREL BISULFATE 75 MG PO TABS: 75.0000 mg | ORAL_TABLET | Freq: Every day | ORAL | 1 refills | Status: DC

## 2016-11-30 NOTE — Care Management Note (Signed)
Case Management Note  Patient Details  Name: Andres Smith MRN: [id number] Date of Birth: [birth year]  Subjective/Objective:  76 y.o. male with right hemianopsia S/P tPA.  PTA, pt resides at home with spouse.  At baseline he has PEG tube for dysphagia and right foot drop.                   Action/Plan: Will follow for discharge planning as pt progresses.  PT/OT consults pending.    Expected Discharge Date:  11/30/16               Expected Discharge Plan:  [place]  In-House Referral:     Discharge planning Services  CM Consult  Post Acute Care Choice:  Home Health Choice offered to:  Patient, Spouse  DME Arranged:    DME Agency:     HH Arranged:  RN, PT, OT, Nurse's Aide [place] Agency:  [place]  Status of Service:  Completed, signed off  If discussed at [place] of Stay Meetings, dates discussed:    Additional Comments:  11/30/16 J. Sharnice Bosler, RN, BSN Pt medically stable for discharge home today with spouse.  PT/OT recommending OP follow up, but pt/family prefer [facility] services, as pt unable to drive, and wife with limited driving abilities at times.  Received HH orders from neuro NP; referral to [facility], per pt/wife choice.  Start of care 24-48h post dc date.  Best phone #s to call are [phone] and [phone].    Reinaldo Raddle, RN, BSN  Trauma/Neuro ICU Case Manager [phone]

## 2016-11-30 NOTE — Progress Notes (Signed)
STROKE TEAM PROGRESS NOTE  Admission History: Andres Smith is an 76 y.o. 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  Patient was receiving chemotherapy infusion, had just received his heparin flush, when he felt onset of vision loss.  Due to these findings he was given TPA and CTA of head and neck were obtained.  CTA did not show stenosis to account for his symptoms.  On arrival to ED - he continues to have right hemianopsia with no other symptoms.   At baseline he has PEG tube for dysphagia and right foot drop.  Date last known well: Date: 11/28/2016 Time last known well: Time: 12:10 tPA Given: Yes NIHSS2 Modified Rankin: Rankin Score=0  SUBJECTIVE (INTERVAL HISTORY) Son is at the bedside. Patient is found laying in bed in NAD  Overall he feels his condition is unchanged. Continues to complain of vision loss. Voices no new complaints. No new events reported overnight.  He has been seen by therapy and will benefit from outpatient therapy    He seems agreeable.  OBJECTIVE Lab Results: CBC:  Recent Labs  Lab 11/28/16 1028 11/28/16 1333 11/30/16 0505  WBC 6.9 7.1 7.0  HGB 11.6* 10.3* 11.0*  HCT 35.4* 30.9* 32.4*  MCV 96.2 95.1 92.8  PLT 195 154 157   BMP: Recent Labs  Lab 11/28/16 1028 11/28/16 1333 11/29/16 1215 11/29/16 1718 11/30/16 0505  NA 135* 133*  --   --  131*  K 4.2 4.2  --   --  3.9  CL  --  95*  --   --  99*  CO2 31* 29  --   --  26  GLUCOSE 207* 205*  --   --  136*  BUN 22.8 23*  --   --  14  CREATININE 0.9 0.70  --   --  0.64  CALCIUM 9.9 9.1  --   --  8.5*  MG  --   --  1.6* 2.2 1.9  PHOS  --   --  3.1 2.4* 2.7   Liver Function Tests:  Recent Labs  Lab 11/28/16 1028 11/28/16 1333  AST 15 21  ALT 11 14*   ALKPHOS 72 68  BILITOT 0.64 0.6  PROT 7.1 6.3*  ALBUMIN 3.4* 3.3*   Coagulation Studies:  Recent Labs    11/28/16 1333  APTT 27  INR 0.97   Urinalysis:  Recent Labs  Lab 11/28/16 1758  COLORURINE STRAW*  APPEARANCEUR CLEAR  LABSPEC 1.013  PHURINE 8.0  GLUCOSEU 50*  HGBUR NEGATIVE  BILIRUBINUR NEGATIVE  KETONESUR NEGATIVE  PROTEINUR NEGATIVE  NITRITE NEGATIVE  LEUKOCYTESUR NEGATIVE   Urine Drug Screen:     Component Value Date/Time   LABOPIA NONE DETECTED 11/28/2016 1758   COCAINSCRNUR NONE DETECTED 11/28/2016 1758   LABBENZ NONE DETECTED 11/28/2016 1758   AMPHETMU NONE DETECTED 11/28/2016 1758   THCU NONE DETECTED 11/28/2016 1758   LABBARB NONE DETECTED 11/28/2016 1758    PHYSICAL EXAM Temp:  [98.2 F (36.8 C)-99.3 F (37.4 C)] 98.6 F (37 C) (11/30 1200) Pulse Rate:  [39-108] 108 (11/30 1500) Resp:  [16-30] 19 (11/30 1500) BP: (69-140)/(48-98) 120/63 (11/30 1300) SpO2:  [94 %-100 %] 100 % (11/30 1500) Weight:  [159 lb 2.8 oz (72.2 kg)] 159 lb 2.8 oz (72.2 kg) (11/30 1200) General - Well  nourished, well developed, in no apparent distress Respiratory - Lungs clear bilaterally. No wheezing. Cardiovascular - Irregular rate and rhythm   Neurological Examination Mental Status: Alert, oriented, thought content appropriate.  Speech fluent without evidence of aphasia.  Able to follow 3 step commands without difficulty. Diminished recall 2/3 Cranial Nerves: II: right dense homonymous hemianopsia III,IV, VI: ptosis not present, extra-ocular motions intact bilaterally, pupils equal, round, reactive to light and accommodation V,VII: smile symmetric, facial light touch sensation normal bilaterally VIII: hearing normal bilaterally IX,X: uvula rises symmetrically XI: bilateral shoulder shrug XII: midline tongue extension Motor: Right :  Upper extremity   5/5                                      Left:     Upper extremity   5/5             Lower extremity   5/5   -  3/5 Ankle                               Lower extremity   5/5 Right foot drop Tone and bulk:normal tone throughout; no atrophy noted Sensory: Pinprick and light touch intact throughout, bilaterally Deep Tendon Reflexes: 2+ and symmetric throughout Plantars: Right: downgoing                                Left: downgoing Cerebellar: normal finger-to-nose,  and normal heel-to-shin test Gait: not tested   IMAGING: I have personally reviewed the radiological images below and agree with the radiology interpretations. Ct Angio Head W Or Wo Contrast Result Date: 11/28/2016 IMPRESSION: 1. Negative for emergent large intracranial vessel occlusion. 2. Posterior circulation atherosclerosis including multifocal mild to moderate bilateral distal vertebral and basilar artery stenosis. The left PCA appears normal. 3. Stable CT appearance of the brain from earlier today. No acute or evolving infarct identified. 4. Bulky right ICA siphon calcified atherosclerosis with moderate or severe supraclinoid ICA stenosis. 5. Elsewhere the anterior circulation is within normal limits. Electronically Signed    Mr Brain Wo Contrast Result Date: 11/29/2016 IMPRESSION: Large acute LEFT PCA territory infarct affecting much of the LEFT occipital lobe. No superimposed hemorrhage is evident.   Ct Head Code Stroke Wo Contrast Result Date: 11/28/2016 IMPRESSION: 1. No acute cortically based infarct or acute intracranial hemorrhage identified. 2. ASPECTS is 10. 3. 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  Echocardiogram:                                                                not done          PENDING Recent Results (from the past 43800 hour(s))  ECHOCARDIOGRAM COMPLETE   Collection Time: 06/07/16 10:35 AM   Narrative                           [facility] [address]  [place] [place], [place] [address]                            [phone]  Study Conclusions - Left ventricle: The cavity size was mildly dilated. Overall   systolic function appears preserved but cannot give accurate EF   or comment on wall motion as endocardial segments are not   adequately visualized. There was an increased relative   contribution of atrial contraction to ventricular filling.   Doppler parameters are consistent with abnormal left ventricular   relaxation (grade 1 diastolic dysfunction). - Aortic valve: Trileaflet; normal thickness, moderately calcified   leaflets. Valve mobility was restricted. There was very mild   stenosis. Valve area (VTI): 1.73 cm^2. Valve area (Vmax): 1.66   cm^2. Valve area (Vmean): 1.62 cm^2. - Left atrium: The atrium was mildly dilated. - Pulmonic valve: Poorly visualized. - Pulmonary arteries: Systolic pressure could not be accurately   estimated. - Impressions: Due to poor acoustical windows, cannot comment   accurately on EF or on wall motion. Recommend repeat limited   study with definity contrast. Impressions: - Due to poor acoustical windows, cannot comment accurately on EF   or on wall motion. Recommend repeat limited study with definity   contrast.   B/L Carotid U/S:                                                PENDING _____________________________________________________________________ ASSESSMENT: [name] is a 76 y.o. 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 Initial Head CT negative, he was given TPA and CTA of head and neck were obtained. CTA did not show stenosis to account for his symptoms. On arrival to [facility] ED - he continues to have right hemianopsia with no other symptoms.    Large acute LEFT PCA -  LEFT occipital lobe Infarct etiology embolic source unclear paroxysmal A. 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  Bulky right ICA siphon calcified atherosclerosis with moderate or severe supraclinoid ICA stenosis.  Suspected Etiology: embolic due to suspected AFIB but not proven.   Resultant Symptoms: right homonymous hemianopsia Stroke Risk Factors: diabetes mellitus, hyperlipidemia and hypertension Other Stroke Risk Factors: Advanced age, Coronary artery disease  Outstanding Stroke Work-up Studies: Echocardiogram: not done                                    PENDING B/L Carotid U/S:                                                     PENDING  11/29/16: Neuro exam remains stable. No worsening of visual loss and no complaints of weakness on exam. AFIB  noted on Cardiac monitor this AM on rounding. Will consult Cardiology to confirm Dx. Discussed with patient, son and Oncologist Dr Lanice Schwab possibility of starting Eliquis if AFIB confirmed.  PLAN  11/30/2016: Continue Aspirin/ Statin PT/OT/SLP Ongoing aggressive stroke risk factor management Patient counseled to be compliant with her antithrombotic medications May need TEE and Loop if AFIB not confirmed on this admission  INTRACRANIAL Atherosclerosis &Stenosis:   Due to past history of chronic colitis and GI bleed will not do dual antiplatelet therapy but will do coated aspirin only  BASELINE DYSPHAGIA WITH PEG: Dietitian consulted to resume PEG feedings    FLUID/ELECTROLYTE DISORDERS: Hypo Magnesium - replacement in progress Repeat labs in AM  ANEMIA: Hgb 10.3 & Hct 30.9 on admission No signs of active bleeding Repeat labs in AM Will continue to monitor closely transfuse for hgb < 7  HYPERTENSION: Stable Aggressive BP control, goal SBP < 180 Long term BP goal normotensive. Home Meds:  NONE  HYPERLIPIDEMIA:    Component Value Date/Time   CHOL 104 11/29/2016 0717   TRIG 80 11/29/2016 0717   TRIG 54 12/07/2005 1117   HDL 41 11/29/2016 0717   CHOLHDL 2.5 11/29/2016 0717   VLDL 16 11/29/2016 0717   LDLCALC 47 11/29/2016 0717  Home Meds:  Lipitor 80 mg LDL  goal < 70 Continued on  Lipitor to 80 mg daily Continue statin at discharge  DIABETES:Type 2 diabetes mellitus with hyperglycemia  Lab Results  Component Value Date   HGBA1C 7.4 (H) 11/29/2016  HgbA1c goal < 7.0 Currently on: Novolog Continue CBG monitoring and SSI DM education   Other Active Problems: Active Problems:   Stroke (cerebrum) (HCC)   Paroxysmal atrial fibrillation [facility])  Hospital day # 2  VTE prophylaxis: SCD's  Diet : DIET SOFT Room service appropriate? Yes; Fluid consistency: Thin   Prior Home Stroke Medications: aspirin 81 mg daily and Lipitor 80 mg  Hospital Current Stroke Medications: aspirin 81 mg daily and Lipitor 80 mg Stroke New Meds Plan: Now on aspirin 81 mg daily and Lipitor 80 mg Discharge Stroke Meds: Likely on  Eliquis (apixaban) daily and Lipitor 80 mg   Disposition: 01-Home or Self Care Therapy Recs:  HOME Follow Recs:  Follow-up Information    [name], MD. Schedule an appointment as soon as possible for a visit in 2 week(s).   Specialty:  Hematology and Oncology Contact information: [place] [address] [phone]        Garvin Fila, MD. Schedule an appointment as soon as possible for a visit in 6 week(s).   Specialties:  Neurology, Radiology Contact information: [address] [place] [address] [phone]        Janith Lima, MD. Schedule an appointment as soon as possible for a visit in 2 week(s).   Specialty:  Internal Medicine Contact information: [address] [place] [address] [phone]          Janith Lima, MD- PCP follow up in 2-3 weeks  FAMILY  UPDATES: Son at bedside  TEAM UPDATES: Tucker, B[place] Stroke Neurology Team 11/30/2016   I have personally examined this patient, reviewed notes, independently viewed imaging studies, participated in medical decision making and plan of care.ROS completed by me personally and pertinent positives fully documented  I have made any additions or clarifications directly to the above note. Agree with note above. He has presented with a right homonymous hemianopsia due  to left posterior cerebral artery infarct likely of embolic etiology etiology indeterminate -l A. fib versus vessel to vessel embolism from intracranial atherosclerosis. Patient has history of GI bleed and may not be a good candidate for dual antiplatelet therapy at this time. Maintain strict blood pressure control as per post TPA protocol and close neurological monitoring Continue aspirin alone for now and may switch to eliquis if A. fib is confirmed.  Later on outpatient 30-day heart monitor.  Discussed with Dr. Alvy Bimler which, patient and son and answered question  Discharge patient home today with outpatient therapies and follow-up in 1 week with oncologist to continue chemotherapy.  Cardiology service has arranged for 30-day outpatient heart monitor.  Follow-up in the stroke clinic in 6 weeks.  Long discussion with patient and son at the bedside and answered questions.    Antony Contras, MD Medical Director [facility] Pager: [phone] 11/30/2016 109 PM  To contact Stroke Continuity provider, please refer to [email or web address] After hours, contact General Neurology

## 2016-11-30 NOTE — Evaluation (Addendum)
Physical Therapy Evaluation Patient Details Name: Andres Smith MRN: [id number] DOB: 1940/06/07 Today's Date: 11/30/2016   History of Present Illness  76 y.o. 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 Admitted with sudden onset of right sided vision loss s/p tPA with occipital infarct  Clinical Impression  Pt very pleasant and eager to return home. Pt reports an active lifestyle and playing golf despite onset of right foot drop. Pt reports he was supposed to go to therapy but he didn't and educated for benefit of AFO for balance and gait. Pt with decreased safety awareness, vision and balance who will benefit from acute therapy to maximize mobility, function, safety and independence to decrease burden of care. Pt encouraged to mobilize acutely with assist and educated for need to scan environment as well as attend to right with impaired vision. Will follow acutely.  Family states wife cannot drive pt to OPPT and therefore recommend HHPT.      Follow Up Recommendations HHPT;Supervision for mobility/OOB    Equipment Recommendations  None recommended by PT    Recommendations for Other Services       Precautions / Restrictions Precautions Precautions: Fall Precaution Comments: right foot drop, right hemianopsia      Mobility  Bed Mobility               General bed mobility comments: in chair on arrival  Transfers Overall transfer level: Needs assistance   Transfers: Sit to/from Stand Sit to Stand: Supervision         General transfer comment: supervision for safety   Ambulation/Gait Ambulation/Gait assistance: Supervision Ambulation Distance (Feet): 200 Feet Assistive device: None Gait Pattern/deviations: Step-through pattern;Decreased stride length;Decreased dorsiflexion - right;Steppage    Gait velocity interpretation: Below normal speed for age/gender General Gait Details: pt with steppage gait on RLE with ability to maintain balance and needs supervision and cues for vision and attending to environment  Stairs Stairs: Yes Stairs assistance: Min assist Stair Management: No rails;Forwards;Step to pattern Number of Stairs: 2 General stair comments: pt with good use of stepping ascending stairs but required min assist for balance with descent with change in depth perception. Pt encouraged to install rail and have assist initially for stairs at home  Wheelchair Mobility    Modified Rankin (Stroke Patients Only)       Balance Overall balance assessment: Needs assistance   Sitting balance-Leahy Scale: Good       Standing balance-Leahy Scale: Good                               Pertinent Vitals/Pain Pain Assessment: No/denies pain    Home Living Family/patient expects to be discharged to:: Private residence Living Arrangements: Spouse/significant other Available Help at Discharge: Family;Available 24 hours/day Type of Home: House Home Access: Stairs to enter Entrance Stairs-Rails: None Entrance Stairs-Number of Steps: 2 Home Layout: Two level;Able to live on main level with bedroom/bathroom Home Equipment: Shower seat - built in      Prior Function Level of Independence: Independent         Comments: pt has been using steppage gait for 6 months since onset of foot drop     Hand Dominance        Extremity/Trunk Assessment   Upper Extremity Assessment Upper Extremity Assessment: Defer to OT evaluation  Lower Extremity Assessment Lower Extremity Assessment: RLE deficits/detail RLE Deficits / Details: foot drop    Cervical / Trunk Assessment Cervical / Trunk Assessment: Other exceptions Cervical / Trunk Exceptions: very limited rotation and spinal motion due to ankylosing spondylitis  Communication   Communication: No  difficulties  Cognition Arousal/Alertness: Awake/alert Behavior During Therapy: WFL for tasks assessed/performed Overall Cognitive Status: Impaired/Different from baseline Area of Impairment: Safety/judgement                         Safety/Judgement: Decreased awareness of deficits;Decreased awareness of safety     General Comments: pt requires cues for safety, attending to right side and avoidance of obstacles      General Comments      Exercises     Assessment/Plan    PT Assessment Patient needs continued PT services  PT Problem List Decreased mobility;Decreased safety awareness;Decreased activity tolerance;Decreased balance;Decreased cognition       PT Treatment Interventions Gait training;Therapeutic exercise;Patient/family education;Balance training;Stair training;Functional mobility training;Therapeutic activities    PT Goals (Current goals can be found in the Care Plan section)  Acute Rehab PT Goals Patient Stated Goal: play golf PT Goal Formulation: With patient/family Time For Goal Achievement: 12/07/16 Potential to Achieve Goals: Good    Frequency Min 3X/week   Barriers to discharge        Co-evaluation               AM-PAC PT "6 Clicks" Daily Activity  Outcome Measure Difficulty turning over in bed (including adjusting bedclothes, sheets and blankets)?: None Difficulty moving from lying on back to sitting on the side of the bed? : None Difficulty sitting down on and standing up from a chair with arms (e.g., wheelchair, bedside commode, etc,.)?: None Help needed moving to and from a bed to chair (including a wheelchair)?: A Little Help needed walking in hospital room?: A Little Help needed climbing 3-5 steps with a railing? : A Little 6 Click Score: 21    End of Session Equipment Utilized During Treatment: Gait belt Activity Tolerance: Patient tolerated treatment well Patient left: Other (comment)(transitioned to OT end of  session) Nurse Communication: Mobility status PT Visit Diagnosis: Other abnormalities of gait and mobility (R26.89);Other symptoms and signs involving the nervous system (R29.898)    Time: 6503-5465 PT Time Calculation (min) (ACUTE ONLY): 16 min   Charges:   PT Evaluation $PT Eval Moderate Complexity: 1 Mod     PT G Codes:        Elwyn Reach, PT [phone]   Arroy[place] B Tacha Manni 11/30/2016, 10:25 AM

## 2016-11-30 NOTE — Evaluation (Addendum)
Occupational Therapy Evaluation and Discharge Patient Details Name: Andres Smith MRN: [id number] DOB: [birth year] Today's Date: 11/30/2016    History of Present Illness 76 y.o. 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 Currently pt requires min guard-supervision for ADL and functional mobility. Pt presenting with R sided visual deficits and balance issues impacting his independence and safety with ADL and functional mobility. Educated pt on visual compensatory strategies and home safety/fall prevention. Pt planning to d/c home today with family supervision. Recommending HHOT for follow up to maximize independence and safety with ADL and functional mobility. All further OT needs can be met at the next venue of care. Please re-consult if needs change. Thank you for this referral.    Follow Up Recommendations  Home health OT;Supervision/Assistance - 24 hour    Equipment Recommendations  Tub/shower seat;Other (comment)(grab bars in shower)    Recommendations for Other Services       Precautions / Restrictions Precautions Precautions: Fall Precaution Comments: right foot drop, right hemianopsia Restrictions Weight Bearing Restrictions: No      Mobility Bed Mobility               General bed mobility comments: Pt OOB with PT upon arrival  Transfers Overall transfer level: Needs assistance Equipment used: None Transfers: Sit to/from Stand Sit to Stand: Min guard         General transfer comment: initially    Balance Overall balance assessment: Needs assistance Sitting-balance support: Feet supported;No upper extremity supported Sitting  balance-Leahy Scale: Good     Standing balance support: No upper extremity supported;During functional activity Standing balance-Leahy Scale: Good                             ADL either performed or assessed with clinical judgement   ADL Overall ADL's : Needs assistance/impaired Eating/Feeding: Set up;Sitting   Grooming: Supervision/safety;Standing   Upper Body Bathing: Set up;Sitting   Lower Body Bathing: Min guard;Sit to/from stand   Upper Body Dressing : Set up;Sitting   Lower Body Dressing: Min guard;Sit to/from stand   Toilet Transfer: Min guard;Ambulation;Comfort height toilet           Functional mobility during ADLs: Min guard General ADL Comments: Educated pt on compensatory strategies for visual deficits, no driving, fall prevention and home safety--installing grab bars in shower and use of shower seat due to balance and visual deficits.     Vision Baseline Vision/History: Wears glasses Wears Glasses: Reading only Patient Visual Report: Peripheral vision impairment Vision Assessment?: Yes Eye Alignment: Within Functional Limits Ocular Range of Motion: Within Functional Limits Alignment/Gaze Preference: Within Defined Limits Tracking/Visual Pursuits: Able to track stimulus in all quads without difficulty Visual Fields: Right homonymous hemianopsia Additional Comments: Pt aware of visual deficits and need to compensate     Perception     Praxis      Pertinent Vitals/Pain Pain Assessment: No/denies pain     Hand Dominance Right   Extremity/Trunk Assessment Upper Extremity Assessment Upper Extremity Assessment: Overall WFL for tasks assessed   Lower Extremity Assessment Lower Extremity Assessment: Defer to PT evaluation   Cervical / Trunk Assessment Cervical / Trunk Assessment: Other exceptions  Cervical / Trunk Exceptions: very limited rotation and spinal motion due to ankylosing spondylitis   Communication  Communication Communication: No difficulties   Cognition Arousal/Alertness: Awake/alert Behavior During Therapy: WFL for tasks assessed/performed Overall Cognitive Status: Impaired/Different from baseline Area of Impairment: Safety/judgement                         Safety/Judgement: Decreased awareness of deficits;Decreased awareness of safety     General Comments: pt requires cues for safety, attending to right side and avoidance of obstacles   General Comments       Exercises     Shoulder Instructions      Home Living Family/patient expects to be discharged to:: Private residence Living Arrangements: Spouse/significant other Available Help at Discharge: Family;Available 24 hours/day Type of Home: House Home Access: Stairs to enter [employer] of Steps: 2 Entrance Stairs-Rails: None Home Layout: Two level;Bed/bath upstairs     Bathroom Shower/Tub: Occupational psychologist: Standard     Home Equipment: Shower seat - built in          Prior Functioning/Environment Level of Independence: Independent                 OT Problem List:        OT Treatment/Interventions:      OT Goals(Current goals can be found in the care plan section) Acute Rehab OT Goals Patient Stated Goal: play golf OT Goal Formulation: All assessment and education complete, DC therapy  OT Frequency:     Barriers to D/C:            Co-evaluation              AM-PAC PT "6 Clicks" Daily Activity     Outcome Measure Help from another person eating meals?: None Help from another person taking care of personal grooming?: A Little Help from another person toileting, which includes using toliet, bedpan, or urinal?: A Little Help from another person bathing (including washing, rinsing, drying)?: A Little Help from another person to put on and taking off regular upper body clothing?: A Little Help from another person to put on and taking off regular  lower body clothing?: A Little 6 Click Score: 19   End of Session Equipment Utilized During Treatment: Gait belt Nurse Communication: Other (comment)(in bathroom)  Activity Tolerance: Patient tolerated treatment well Patient left: with call bell/phone within reach;with family/visitor present;Other (comment)(in bathroom)  OT Visit Diagnosis: Unsteadiness on feet (R26.81);Low vision, both eyes (H54.2)                Time: 4996-9249 OT Time Calculation (min): 15 min Charges:  OT General Charges $OT Visit: 1 Visit OT Evaluation $OT Eval Moderate Complexity: 1 Mod G-Codes:     Doniesha Landau A. Ulice Brilliant, M.S., OTR/L Pager: [place] 11/30/2016, 2:32 PM

## 2016-11-30 NOTE — Progress Notes (Signed)
Bilateral carotid artery duplex has been completed. 1-39% ICA stenosis bilaterally.  11/30/16 12:10 PM Andres Smith RVT

## 2016-11-30 NOTE — Care Management Note (Signed)
Case Management Note  Patient Details  Name: Andres Smith MRN: [id number] Date of Birth: 1940/12/24  Subjective/Objective:  76 y.o. male with right hemianopsia S/P tPA.  PTA, pt resides at home with spouse.  At baseline he has PEG tube for dysphagia and right foot drop.                   Action/Plan: Will follow for discharge planning as pt progresses.  PT/OT consults pending.    Expected Discharge Date:  11/30/16               Expected Discharge Plan:  [place]  In-House Referral:     Discharge planning Services  CM Consult  Post Acute Care Choice:  Home Health Choice offered to:  Patient, Spouse  DME Arranged:  Shower stool DME Agency:  [place] Arranged:  RN, PT, OT, Nurse's Aide [place] Agency:  [facility]  Status of Service:  Completed, signed off  If discussed at [place] of Stay Meetings, dates discussed:    Additional Comments:  11/30/16 J. Kraven Calk, RN, BSN Pt medically stable for discharge home today with spouse.  PT/OT recommending OP follow up, but pt/family prefer [facility] services, as pt unable to drive, and wife with limited driving abilities at times.  Received HH orders from neuro NP; referral to [facility], per pt/wife choice.  Start of care 24-48h post dc date.  Best phone #s to call are [phone] and [phone].    Pt/family requesting shower chair.  Referral to [facility] for DME needs.    Reinaldo Raddle, RN, BSN  Trauma/Neuro ICU Case Manager [phone]

## 2016-12-01 ENCOUNTER — Telehealth: Payer: Self-pay | Admitting: Physician Assistant

## 2016-12-01 NOTE — Telephone Encounter (Signed)
Pt wife called stating he was discharged from hospital following a stroke and was never set up for an event monitor. I advised her this is not something that we can do on a Sat and to please call the office on Monday morning. I also sent a staff message to schedule event monitor and follow up appt.

## 2016-12-01 NOTE — Discharge Summary (Signed)
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 Ankylosing spondylitis ([place])   . Blood transfusion    hx of transfusion without reaction  . Bradycardia   . Cancer ([name]) 03/2016   oral  . Cholelithiasis   . Coronary artery disease    a. s/p CABG x 6 in 1997 (LIMA->LAD, VG->RI ->OM1->OM2, VG->PDA->PLV // b. 05/2011 Cath:  patent grafs, native prox rca and d2 dzs  ->med rx. // c. Myoview 1/18: not gated, large inferolateral scar, no ischemia; Intermediate Risk (IL scar old - on prior studies >> med rx)  . Crohn's colitis ([place]) 12/01/1997  . Diabetes mellitus   . Dysphagia   . Fatty liver   . GERD (gastroesophageal reflux disease)   . Heart block   . Hiatal hernia 12/03/2008  . History of radiation therapy 05/23/16- 07/09/16   Left Cheek/ 60 Gy in 30 fractions to gross disease, 63 Gy in 35 fractions to high risk nodal echelons, and 56 Gy in 35 fractions to intermedicate risk nodal echelons.   . History of skin cancer   . Hx of echocardiogram    Echo 5/14:  Mild LVH, EF 55-60%, NL diast fxn, mild LAE   . Hyperlipidemia   . Hypertension   . Kidney stones   . Neck pain   . Pneumonia    hx of PNA  . Squamous cell cancer of buccal mucosa ([place])   . 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 cancer removal  2014   removed from left outter leg  . COLONOSCOPY  2012  . CORONARY ARTERY BYPASS GRAFT  04/19/1995   x7  . EXCISION  ORAL TUMOR Left    left jaw and lymph node  . IR FLUORO GUIDE PORT INSERTION RIGHT  05/21/2016  . IR GASTROSTOMY TUBE MOD SED  05/21/2016  . IR US GUIDE VASC ACCESS RIGHT  05/21/2016  . LEFT HEART CATHETERIZATION WITH CORONARY/GRAFT ANGIOGRAM N/A 05/24/2011   Procedure: LEFT HEART CATHETERIZATION WITH Beatrix Fetters;  Surgeon: Larey Dresser, MD;  Location[facility] CATH LAB;  Service: Cardiovascular;  Laterality: N/A;  . MOHS SURGERY     X 2 off chin and nose  . TONSILLECTOMY AND ADENOIDECTOMY  1944  . TRIGGER FINGER RELEASE     x 2  . UMBILICAL HERNIA REPAIR     Allergies as of 11/30/2016   No Known Allergies     Medication List    TAKE these medications   atorvastatin 80 MG tablet Commonly known as:  LIPITOR Take 80 mg by mouth at bedtime.   balsalazide 750 MG capsule Commonly known as:  COLAZAL TAKE 3 CAPSULES 3 TIMES A DAY. OPEN CAPS & SPRINKLE ON APPLESAUCE OR DISSOLVE IN WATER & TAKE VIA TUBE.   clopidogrel 75 MG tablet Commonly known as:  PLAVIX Take 1 tablet (75 mg total) by mouth daily.   cyanocobalamin 1000 MCG/ML injection Commonly  known as:  (VITAMIN B-12) INJECT 1ML ONCE MONTHLY.   feeding supplement (OSMOLITE 1.5 CAL) Liqd Give 1 and 1/2 cans Osmolite 1.5 via PEG 4 times daily with 60 mL free water before and after bolus feedings. In addition, drink or flush PEG with 240 mL free water TID. Send formula and supplies.   isosorbide mononitrate 20 MG tablet Commonly known as:  ISMO,MONOKET Crush 2 tablets (40 mg total) by mouth 2 (two) times daily at 10 AM and 5 PM. What changed:    how much to take  how to take this  when to take this  additional instructions   mercaptopurine 50 MG tablet Commonly known as:  PURINETHOL TAKE ONE TABLET ONCE A DAY ON AN EMPTY STOMACH 1 HR BEFORE OR 2HRS AFTER A MEAL   metFORMIN 500 MG tablet Commonly known as:  GLUCOPHAGE Place 1 tablet (500 mg total) into feeding tube 2 (two) times daily with a meal.    nitroGLYCERIN 0.4 MG SL tablet Commonly known as:  NITROSTAT Place 1 tablet (0.4 mg total) under the tongue every 5 (five) minutes as needed for chest pain. Max 3 doses. In no response call 911.   predniSONE 5 MG tablet Commonly known as:  DELTASONE TAKE 2 TABLETS IN THE MORNING WITH BREAKFAST. What changed:  See the new instructions.   traMADol 50 MG tablet Commonly known as:  ULTRAM Take 1 tablet (50 mg total) by mouth every 6 (six) hours as needed for moderate pain.      LABORATORY STUDIES CBC    Component Value Date/Time   WBC 7.0 11/30/2016 0505   RBC 3.49 (L) 11/30/2016 0505   HGB 11.0 (L) 11/30/2016 0505   HGB 11.6 (L) 11/28/2016 1028   HCT 32.4 (L) 11/30/2016 0505   HCT 35.4 (L) 11/28/2016 1028   PLT 157 11/30/2016 0505   PLT 195 11/28/2016 1028   MCV 92.8 11/30/2016 0505   MCV 96.2 11/28/2016 1028   MCH 31.5 11/30/2016 0505   MCHC 34.0 11/30/2016 0505   RDW 15.6 (H) 11/30/2016 0505   RDW 16.8 (H) 11/28/2016 1028   LYMPHSABS 0.5 (L) 11/28/2016 1333   LYMPHSABS 0.5 (L) 11/28/2016 1028   MONOABS 0.2 11/28/2016 1333   MONOABS 0.7 11/28/2016 1028   EOSABS 0.0 11/28/2016 1333   EOSABS 0.1 11/28/2016 1028   BASOSABS 0.0 11/28/2016 1333   BASOSABS 0.0 11/28/2016 1028   CMP    Component Value Date/Time   NA 131 (L) 11/30/2016 0505   NA 135 (L) 11/28/2016 1028   K 3.9 11/30/2016 0505   K 4.2 11/28/2016 1028   CL 99 (L) 11/30/2016 0505   CO2 26 11/30/2016 0505   CO2 31 (H) 11/28/2016 1028   GLUCOSE 136 (H) 11/30/2016 0505   GLUCOSE 207 (H) 11/28/2016 1028   GLUCOSE 98 12/12/2005 0729   BUN 14 11/30/2016 0505   BUN 22.8 11/28/2016 1028   CREATININE 0.64 11/30/2016 0505   CREATININE 0.9 11/28/2016 1028   CALCIUM 8.5 (L) 11/30/2016 0505   CALCIUM 9.9 11/28/2016 1028   PROT 6.3 (L) 11/28/2016 1333   PROT 7.1 11/28/2016 1028   ALBUMIN 3.3 (L) 11/28/2016 1333   ALBUMIN 3.4 (L) 11/28/2016 1028   AST 21 11/28/2016 1333   AST 15 11/28/2016 1028   ALT 14 (L)  11/28/2016 1333   ALT 11 11/28/2016 1028   ALKPHOS 68 11/28/2016 1333   ALKPHOS 72 11/28/2016 1028   BILITOT 0.6 11/28/2016 1333   BILITOT 0.64 11/28/2016 1028  GFRNONAA >60 11/30/2016 0505   GFRAA >60 11/30/2016 0505   COAGS Lab Results  Component Value Date   INR 0.97 11/28/2016   INR 1.02 05/21/2016   INR 0.9 ratio 12/02/2008   Lipid Panel    Component Value Date/Time   CHOL 104 11/29/2016 0717   TRIG 80 11/29/2016 0717   TRIG 54 12/07/2005 1117   HDL 41 11/29/2016 0717   CHOLHDL 2.5 11/29/2016 0717   VLDL 16 11/29/2016 0717   LDLCALC 47 11/29/2016 0717   HgbA1C  Lab Results  Component Value Date   HGBA1C 7.4 (H) 11/29/2016   Urinalysis    Component Value Date/Time   COLORURINE STRAW (A) 11/28/2016 1758   APPEARANCEUR CLEAR 11/28/2016 1758   LABSPEC 1.013 11/28/2016 1758   PHURINE 8.0 11/28/2016 1758   GLUCOSEU 50 (A) 11/28/2016 1758   GLUCOSEU NEGATIVE 05/16/2015 1621   HGBUR NEGATIVE 11/28/2016 1758   BILIRUBINUR NEGATIVE 11/28/2016 1758   KETONESUR NEGATIVE 11/28/2016 1758   PROTEINUR NEGATIVE 11/28/2016 1758   UROBILINOGEN 0.2 05/16/2015 1621   NITRITE NEGATIVE 11/28/2016 1758   LEUKOCYTESUR NEGATIVE 11/28/2016 1758   Urine Drug Screen     Component Value Date/Time   LABOPIA NONE DETECTED 11/28/2016 1758   COCAINSCRNUR NONE DETECTED 11/28/2016 1758   LABBENZ NONE DETECTED 11/28/2016 1758   AMPHETMU NONE DETECTED 11/28/2016 1758   THCU NONE DETECTED 11/28/2016 1758   LABBARB NONE DETECTED 11/28/2016 1758     SIGNIFICANT DIAGNOSTIC STUDIES Ct Angio Head W Or Wo Contrast Result Date: 11/28/2016 IMPRESSION: 1. Negative for emergent large intracranial vessel occlusion. 2. Posterior circulation atherosclerosis including multifocal mild to moderate bilateral distal vertebral and basilar artery stenosis. The left PCA appears normal. 3. Stable CT appearance of the brain from earlier today. No acute or evolving infarct identified. 4. Bulky right ICA siphon  calcified atherosclerosis with moderate or severe supraclinoid ICA stenosis. 5. Elsewhere the anterior circulation is within normal limits. Electronically Signed    Mr Brain Wo Contrast Result Date: 11/29/2016 IMPRESSION: Large acute LEFT PCA territory infarct affecting much of the LEFT occipital lobe. No superimposed hemorrhage is evident.   Ct Head Code Stroke Wo Contrast Result Date: 11/28/2016 IMPRESSION: 1. No acute cortically based infarct or acute intracranial hemorrhage identified. 2. ASPECTS is 10. 3. 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  Echocardiogram:                                    Study Conclusions - Left ventricle: LVEF is approximately 45 to 50% with basal   inferior hypokinesis. The cavity size was normal. Wall thickness   was increased in a pattern of mild LVH. - Right ventricle: The cavity size was mildly dilated. Systolic   function was mildly reduced. Impressions: - Poor acoustic windows limit study  B/L Carotid U/S:                                                 Right Carotid: There is evidence in the right ICA of a 1-39% stenosis. Left Carotid: There is evidence in the left ICA of a 1-39% stenosis. Vertebrals: Both vertebral arteries were patent with antegrade flow.  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 CTA did not show stenosis to account for his symptoms. On arrival [facility] ED-  hecontinues to have right hemianopsia with no other symptoms. At baseline he has PEG tube for dysphagia and right foot drop.  Date last known well:Date:11/28/2016 Time last known well:Time:12:10 tPA Given:Yes, NIHSS2 Modified Rankin:Rankin Score=0  Large acute LEFT PCA - LEFT occipital lobe Infarct etiology embolic source unclear paroxysmal A. 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  Bulky right ICA siphon calcified atherosclerosis with moderate or severe supraclinoid ICA stenosis.  Suspected Etiology: embolic due to suspected AFIB but not proven.   Resultant Symptoms: righthomonymoushemianopsia Stroke Risk Factors: diabetes mellitus, hyperlipidemia and hypertension Other Stroke Risk Factors: Advanced age, Coronary artery disease  11/29/16: Neuro exam remains stable. No worsening of visual loss and no complaints of weakness on exam. AFIB noted on Cardiac monitor this AM on rounding. Will consult Cardiology to confirm Dx. Discussed with patient, son and Oncologist Dr Lanice Schwab possibility of starting Eliquis if AFIB confirmed.  11/30/16: He has presented with a right homonymous hemianopsia due to left posterior cerebral artery infarct likely of embolic etiology etiology indeterminate -l A. fib versus vessel to vessel embolism from intracranial atherosclerosis. Patient has history of GI bleed and may not be a good candidate for dual antiplatelet therapy at this time. Continue Plavix alone for now and may switch to eliquis if A. fib is confirmed.  Later on outpatient 30-day heart monitor.  Discussed with Dr. Alvy Bimler which, patient and son and answered question  Discharge patient home today with outpatient therapies and follow-up in 1 week with oncologist to continue chemotherapy.  Cardiology service has arranged for 30-day outpatient heart monitor.   Follow-up in the stroke clinic in 6 weeks.  Long discussion with patient and son at the bedside and answered questions.  PLAN  11/30/2016: Continue Plavix/ Statin PT/OT/SLP - HH Patient counseled to be compliant withhisantithrombotic medications Outpatient Cardiac heart monitor to r/o AFIB,  not confirmed on this admission  R/O AFIB Follow up with outpatient Cardiology Outpatient Cardiac heart monitor to r/o AFIB,  not confirmed on this admission May consider Eliquis if AFIB confirmed  INTRACRANIAL Atherosclerosis &Stenosis: Due to past history of chronic colitis and GI bleed will not do dual antiplatelet therapy. Changed to Plavix   BASELINE DYSPHAGIA WITH PEG: Resume home PEG feedings   FLUID/ELECTROLYTE DISORDERS: Resolved PCP to monitor  ANEMIA: Stable No signs of active bleeding PCP to monitor   HYPERTENSION: Stable Aggressive BP control, goal SBP <180 Long term BP goal normotensive. PCP to monitor  HYPERLIPIDEMIA: Labs(Brief)          Component Value Date/Time   CHOL 104 11/29/2016 0717   TRIG 80 11/29/2016 0717   TRIG 54 12/07/2005 1117   HDL 41 11/29/2016 0717   CHOLHDL 2.5 11/29/2016 0717   VLDL 16 11/29/2016 0717   LDLCALC 47 11/29/2016 0717    Home Meds:  Lipitor 80 mg LDL  goal < 70 Continued on  Lipitor to 80 mg daily Continued statin at discharge  DIABETES:Type 2 diabetes mellitus with hyperglycemia  RecentLabs       Lab Results  Component Value Date   HGBA1C 7.4 (H) 11/29/2016    HgbA1c goal < 7.0 Currently on: Novolog Continue CBG monitoring and SSI DM education Home meds resumed PCP to monitor   Other Active Problems: Active Problems:   Stroke (cerebrum) (H[facility])   Paroxysmal atrial fibrillation [facility])  Hospital day # 2  VTE prophylaxis: SCD's  Diet : DIET SOFT Room service appropriate? Yes; Fluid consistency: Thin   DISCHARGE EXAM Blood pressure 120/63, pulse (!) 108, temperature 98.6 F (37 C),  temperature source Oral, resp. rate 19, height 5\' 8"  (1.727 m), weight 72.2 kg (159 lb 2.8 oz), SpO2 100 %.  General - Well nourished, well developed, in no apparent distress Respiratory - Lungs clear bilaterally. No wheezing. Cardiovascular - Irregular rate and rhythm   Neurological Examination Mental Status: Alert, oriented, thought content appropriate. Speech fluent without evidence of aphasia. Able to follow 3 step commands without difficulty. Diminished recall 2/3 Cranial Nerves: [id number] homonymoushemianopsia III,IV, VI: ptosis not present, extra-ocular motions intact bilaterally, pupils equal, round, reactive to light and accommodation V,VII: smile symmetric, facial light touch sensation normal bilaterally VIII: hearing normal bilaterally IX,X: uvula rises symmetrically XI: bilateral shoulder shrug XII: midline tongue extension Motor: Right :Upper extremity 5/5Left: Upper extremity 5/5 Lower extremity 5/5- 3/5 AnkleLower extremity 5/5 Right foot drop Tone and bulk:normal tone throughout; no atrophy noted Sensory: Pinprick and light touch intact throughout, bilaterally Deep Tendon Reflexes: 2+ and symmetric throughout Plantars: Right: downgoingLeft: downgoing Cerebellar: normal finger-to-nose, and normal heel-to-shin test Gait:not tested  DISCHARGE PLAN  Prior Home Stroke Medications: aspirin 81 mg daily and Lipitor 80 mg  Hospital Current Stroke Medications: aspirin 81 mg daily and Lipitor 80 mg Stroke New Meds Plan: Now on aspirin 81 mg daily and Lipitor 80 mg Discharge Stroke Meds: Plavix 75mg  daily and Lipitor 80 mg   Disposition: 01-Home or Self Care Therapy Recs: HOME with [place] PT/OT/SLP Follow Recs:     Follow-up Information    [name], MD. Schedule an appointment as soon as possible for a visit in 2 week(s).    Specialty:  Hematology and Oncology Contact information: [place] [address] [phone]        Garvin Fila, MD. Schedule an appointment as soon as possible for a visit in 6 week(s).   Specialties:  Neurology, Radiology Contact information: [address] [place] [address] [phone]        Janith Lima, MD. Schedule an appointment as soon as possible for a visit in 2 week(s).   Specialty:  Internal Medicine Contact information: [address] [address] [place], ANP-C Stroke Neurology Team 11/30/16  Greater than 35 minutes were spent preparing discharge. I have personally examined this patient, reviewed notes, independently viewed imaging studies, participated in medical decision making and plan of care.ROS completed by me personally and pertinent positives fully documented  I have made any additions or clarifications directly to the above note. Agree with note above.   Antony Contras, MD Medical Director [place] Pager: [phone] 12/01/2016 3:40 PM

## 2016-12-03 ENCOUNTER — Other Ambulatory Visit: Payer: Self-pay | Admitting: Cardiovascular Disease

## 2016-12-03 DIAGNOSIS — Z483 Aftercare following surgery for neoplasm: Secondary | ICD-10-CM | POA: Diagnosis not present

## 2016-12-03 DIAGNOSIS — K117 Disturbances of salivary secretion: Secondary | ICD-10-CM | POA: Diagnosis not present

## 2016-12-03 DIAGNOSIS — C7800 Secondary malignant neoplasm of unspecified lung: Secondary | ICD-10-CM | POA: Diagnosis not present

## 2016-12-03 DIAGNOSIS — C060 Malignant neoplasm of cheek mucosa: Secondary | ICD-10-CM | POA: Diagnosis not present

## 2016-12-03 DIAGNOSIS — R1310 Dysphagia, unspecified: Secondary | ICD-10-CM | POA: Diagnosis not present

## 2016-12-03 DIAGNOSIS — Z85818 Personal history of malignant neoplasm of other sites of lip, oral cavity, and pharynx: Secondary | ICD-10-CM | POA: Diagnosis not present

## 2016-12-03 NOTE — Progress Notes (Signed)
Late entry for missed Modified Rankin Score. Based on the review of the medical record.     11/30/16 1430  Modified Rankin (Stroke Patients Only)  Pre-Morbid Rankin Score 1  Modified Rankin [place], [place]  [phone] 12/03/2016

## 2016-12-04 ENCOUNTER — Telehealth: Payer: Self-pay | Admitting: Cardiovascular Disease

## 2016-12-04 DIAGNOSIS — C069 Malignant neoplasm of mouth, unspecified: Secondary | ICD-10-CM | POA: Diagnosis not present

## 2016-12-04 DIAGNOSIS — I69991 Dysphagia following unspecified cerebrovascular disease: Secondary | ICD-10-CM | POA: Diagnosis not present

## 2016-12-04 NOTE — Telephone Encounter (Signed)
Spoke with patient and his wife and they are calling about appointments scheduled for his post hospital visit.  They don't feel they were given good discharge instructions.  They were both under the impression that he would be discharged with a 30 day monitor from the hospital.  I let them know that we do not place monitors on at the hospital and I was sorry they were misinformed but I would see about getting his monitor appointment moved up from 12/17/16.    I was able to get his appointment moved up to 12/11/189 at 3pm.  He is aware and was very appreciative of my help.

## 2016-12-04 NOTE — Telephone Encounter (Signed)
New message    Dr [name] wants you to call him about the quality of care that Mr Scala has received,  No one has followed up with patient for cardiac care

## 2016-12-05 ENCOUNTER — Ambulatory Visit (HOSPITAL_BASED_OUTPATIENT_CLINIC_OR_DEPARTMENT_OTHER): Payer: PPO | Admitting: Hematology and Oncology

## 2016-12-05 ENCOUNTER — Encounter: Payer: Self-pay | Admitting: Hematology and Oncology

## 2016-12-05 ENCOUNTER — Other Ambulatory Visit: Payer: Self-pay

## 2016-12-05 ENCOUNTER — Telehealth: Payer: Self-pay

## 2016-12-05 ENCOUNTER — Other Ambulatory Visit (HOSPITAL_BASED_OUTPATIENT_CLINIC_OR_DEPARTMENT_OTHER): Payer: PPO

## 2016-12-05 ENCOUNTER — Ambulatory Visit (HOSPITAL_BASED_OUTPATIENT_CLINIC_OR_DEPARTMENT_OTHER): Payer: PPO

## 2016-12-05 ENCOUNTER — Ambulatory Visit: Payer: PPO

## 2016-12-05 VITALS — BP 108/55 | HR 62 | Temp 97.5°F | Resp 16

## 2016-12-05 DIAGNOSIS — R918 Other nonspecific abnormal finding of lung field: Secondary | ICD-10-CM

## 2016-12-05 DIAGNOSIS — I69991 Dysphagia following unspecified cerebrovascular disease: Secondary | ICD-10-CM | POA: Diagnosis not present

## 2016-12-05 DIAGNOSIS — C060 Malignant neoplasm of cheek mucosa: Secondary | ICD-10-CM

## 2016-12-05 DIAGNOSIS — I639 Cerebral infarction, unspecified: Secondary | ICD-10-CM

## 2016-12-05 DIAGNOSIS — C069 Malignant neoplasm of mouth, unspecified: Secondary | ICD-10-CM | POA: Diagnosis not present

## 2016-12-05 DIAGNOSIS — Z5111 Encounter for antineoplastic chemotherapy: Secondary | ICD-10-CM

## 2016-12-05 LAB — COMPREHENSIVE METABOLIC PANEL
ALT: 17 U/L (ref 0–55)
AST: 22 U/L (ref 5–34)
Albumin: 2.9 g/dL — ABNORMAL LOW (ref 3.5–5.0)
Alkaline Phosphatase: 63 U/L (ref 40–150)
Anion Gap: 10 mEq/L (ref 3–11)
BUN: 20.3 mg/dL (ref 7.0–26.0)
CALCIUM: 9.3 mg/dL (ref 8.4–10.4)
CHLORIDE: 95 meq/L — AB (ref 98–109)
CO2: 29 mEq/L (ref 22–29)
Creatinine: 0.8 mg/dL (ref 0.7–1.3)
Glucose: 246 mg/dl — ABNORMAL HIGH (ref 70–140)
POTASSIUM: 4.3 meq/L (ref 3.5–5.1)
SODIUM: 133 meq/L — AB (ref 136–145)
Total Bilirubin: 0.63 mg/dL (ref 0.20–1.20)
Total Protein: 6.3 g/dL — ABNORMAL LOW (ref 6.4–8.3)

## 2016-12-05 LAB — CBC WITH DIFFERENTIAL/PLATELET
BASO%: 0.1 % (ref 0.0–2.0)
BASOS ABS: 0 10*3/uL (ref 0.0–0.1)
EOS%: 0.7 % (ref 0.0–7.0)
Eosinophils Absolute: 0.1 10*3/uL (ref 0.0–0.5)
HEMATOCRIT: 29.5 % — AB (ref 38.4–49.9)
HGB: 9.7 g/dL — ABNORMAL LOW (ref 13.0–17.1)
LYMPH%: 5.1 % — ABNORMAL LOW (ref 14.0–49.0)
MCH: 31.4 pg (ref 27.2–33.4)
MCHC: 32.9 g/dL (ref 32.0–36.0)
MCV: 95.5 fL (ref 79.3–98.0)
MONO#: 0.8 10*3/uL (ref 0.1–0.9)
MONO%: 11.2 % (ref 0.0–14.0)
NEUT#: 5.7 10*3/uL (ref 1.5–6.5)
NEUT%: 82.9 % — AB (ref 39.0–75.0)
Platelets: 150 10*3/uL (ref 140–400)
RBC: 3.09 10*6/uL — ABNORMAL LOW (ref 4.20–5.82)
RDW: 14.9 % — ABNORMAL HIGH (ref 11.0–14.6)
WBC: 6.9 10*3/uL (ref 4.0–10.3)
lymph#: 0.4 10*3/uL — ABNORMAL LOW (ref 0.9–3.3)

## 2016-12-05 MED ORDER — SODIUM CHLORIDE 0.9 % IV SOLN
Freq: Once | INTRAVENOUS | Status: AC
  Administered 2016-12-05: 12:00:00 via INTRAVENOUS

## 2016-12-05 MED ORDER — PALONOSETRON HCL INJECTION 0.25 MG/5ML
0.2500 mg | Freq: Once | INTRAVENOUS | Status: AC
  Administered 2016-12-05: 0.25 mg via INTRAVENOUS

## 2016-12-05 MED ORDER — FAMOTIDINE IN NACL 20-0.9 MG/50ML-% IV SOLN
INTRAVENOUS | Status: AC
  Filled 2016-12-05: qty 50

## 2016-12-05 MED ORDER — SODIUM CHLORIDE 0.9 % IV SOLN
176.0000 mg | Freq: Once | INTRAVENOUS | Status: AC
  Administered 2016-12-05: 180 mg via INTRAVENOUS
  Filled 2016-12-05: qty 18

## 2016-12-05 MED ORDER — SODIUM CHLORIDE 0.9% FLUSH
10.0000 mL | INTRAVENOUS | Status: DC | PRN
  Administered 2016-12-05: 10 mL
  Filled 2016-12-05: qty 10

## 2016-12-05 MED ORDER — SODIUM CHLORIDE 0.9 % IV SOLN
45.0000 mg/m2 | Freq: Once | INTRAVENOUS | Status: AC
  Administered 2016-12-05: 84 mg via INTRAVENOUS
  Filled 2016-12-05: qty 14

## 2016-12-05 MED ORDER — PROMETHAZINE HCL 25 MG PO TABS: 25.0000 mg | ORAL_TABLET | Freq: Four times a day (QID) | ORAL | 3 refills | Status: DC | PRN

## 2016-12-05 MED ORDER — SODIUM CHLORIDE 0.9 % IV SOLN
20.0000 mg | Freq: Once | INTRAVENOUS | Status: AC
  Administered 2016-12-05: 20 mg via INTRAVENOUS
  Filled 2016-12-05: qty 2

## 2016-12-05 MED ORDER — SODIUM CHLORIDE 0.9% FLUSH
10.0000 mL | Freq: Once | INTRAVENOUS | Status: AC
  Administered 2016-12-05: 10 mL
  Filled 2016-12-05: qty 10

## 2016-12-05 MED ORDER — ONDANSETRON HCL 8 MG PO TABS: 8.0000 mg | ORAL_TABLET | Freq: Three times a day (TID) | ORAL | 3 refills | Status: DC | PRN

## 2016-12-05 MED ORDER — DIPHENHYDRAMINE HCL 50 MG/ML IJ SOLN
50.0000 mg | Freq: Once | INTRAMUSCULAR | Status: AC
  Administered 2016-12-05: 50 mg via INTRAVENOUS

## 2016-12-05 MED ORDER — HEPARIN SOD (PORK) LOCK FLUSH 100 UNIT/ML IV SOLN
500.0000 [IU] | Freq: Once | INTRAVENOUS | Status: AC | PRN
  Administered 2016-12-05: 500 [IU]
  Filled 2016-12-05: qty 5

## 2016-12-05 MED ORDER — FAMOTIDINE IN NACL 20-0.9 MG/50ML-% IV SOLN
20.0000 mg | Freq: Once | INTRAVENOUS | Status: AC
  Administered 2016-12-05: 20 mg via INTRAVENOUS

## 2016-12-05 MED ORDER — PALONOSETRON HCL INJECTION 0.25 MG/5ML
INTRAVENOUS | Status: AC
Start: 2016-12-05 — End: 2016-12-05
  Filled 2016-12-05: qty 5

## 2016-12-05 MED ORDER — DIPHENHYDRAMINE HCL 50 MG/ML IJ SOLN
INTRAMUSCULAR | Status: AC
  Filled 2016-12-05: qty 1

## 2016-12-05 NOTE — Patient Instructions (Signed)
[facility] Discharge Instructions for Patients Receiving Chemotherapy  Today you received the following chemotherapy agents Taxol and Carboplatin  To help prevent nausea and vomiting after your treatment, we encourage you to take your nausea medication as directed.   If you develop nausea and vomiting that is not controlled by your nausea medication, call the clinic.   BELOW ARE SYMPTOMS THAT SHOULD BE REPORTED IMMEDIATELY:  *FEVER GREATER THAN 100.5 F  *CHILLS WITH OR WITHOUT FEVER  NAUSEA AND VOMITING THAT IS NOT CONTROLLED WITH YOUR NAUSEA MEDICATION  *UNUSUAL SHORTNESS OF BREATH  *UNUSUAL BRUISING OR BLEEDING  TENDERNESS IN MOUTH AND THROAT WITH OR WITHOUT PRESENCE OF ULCERS  *URINARY PROBLEMS  *BOWEL PROBLEMS  UNUSUAL RASH Items with * indicate a potential emergency and should be followed up as soon as possible.  Feel free to call the clinic should you have any questions or concerns. The clinic phone number is [phone].  Please show the [facility] at check-in to the Emergency Department and triage nurse.   Paclitaxel injection (Taxol) What is this medicine? PACLITAXEL (PAK li TAX el) is a chemotherapy drug. It targets fast dividing cells, like cancer cells, and causes these cells to die. This medicine is used to treat ovarian cancer, breast cancer, and other cancers. This medicine may be used for other purposes; ask your health care provider or pharmacist if you have questions. COMMON BRAND NAME(S): Onxol, Taxol What should I tell my health care provider before I take this medicine? They need to know if you have any of these conditions: -blood disorders -irregular heartbeat -infection (especially a virus infection such as chickenpox, cold sores, or herpes) -liver disease -previous or ongoing radiation therapy -an unusual or allergic reaction to paclitaxel, alcohol, polyoxyethylated castor oil, other chemotherapy agents, other medicines,  foods, dyes, or preservatives -pregnant or trying to get pregnant -breast-feeding How should I use this medicine? This drug is given as an infusion into a vein. It is administered in a hospital or clinic by a specially trained health care professional. Talk to your pediatrician regarding the use of this medicine in children. Special care may be needed. Overdosage: If you think you have taken too much of this medicine contact a poison control center or emergency room at once. NOTE: This medicine is only for you. Do not share this medicine with others. What if I miss a dose? It is important not to miss your dose. Call your doctor or health care professional if you are unable to keep an appointment. What may interact with this medicine? Do not take this medicine with any of the following medications: -disulfiram -metronidazole This medicine may also interact with the following medications: -cyclosporine -diazepam -ketoconazole -medicines to increase blood counts like filgrastim, pegfilgrastim, sargramostim -other chemotherapy drugs like cisplatin, doxorubicin, epirubicin, etoposide, teniposide, vincristine -quinidine -testosterone -vaccines -verapamil Talk to your doctor or health care professional before taking any of these medicines: -acetaminophen -aspirin -ibuprofen -ketoprofen -naproxen This list may not describe all possible interactions. Give your health care provider a list of all the medicines, herbs, non-prescription drugs, or dietary supplements you use. Also tell them if you smoke, drink alcohol, or use illegal drugs. Some items may interact with your medicine. What should I watch for while using this medicine? Your condition will be monitored carefully while you are receiving this medicine. You will need important blood work done while you are taking this medicine. This medicine can cause serious allergic reactions. To reduce your risk you  will need to take other  medicine(s) before treatment with this medicine. If you experience allergic reactions like skin rash, itching or hives, swelling of the face, lips, or tongue, tell your doctor or health care professional right away. In some cases, you may be given additional medicines to help with side effects. Follow all directions for their use. This drug may make you feel generally unwell. This is not uncommon, as chemotherapy can affect healthy cells as well as cancer cells. Report any side effects. Continue your course of treatment even though you feel ill unless your doctor tells you to stop. Call your doctor or health care professional for advice if you get a fever, chills or sore throat, or other symptoms of a cold or flu. Do not treat yourself. This drug decreases your body's ability to fight infections. Try to avoid being around people who are sick. This medicine may increase your risk to bruise or bleed. Call your doctor or health care professional if you notice any unusual bleeding. Be careful brushing and flossing your teeth or using a toothpick because you may get an infection or bleed more easily. If you have any dental work done, tell your dentist you are receiving this medicine. Avoid taking products that contain aspirin, acetaminophen, ibuprofen, naproxen, or ketoprofen unless instructed by your doctor. These medicines may hide a fever. Do not become pregnant while taking this medicine. Women should inform their doctor if they wish to become pregnant or think they might be pregnant. There is a potential for serious side effects to an unborn child. Talk to your health care professional or pharmacist for more information. Do not breast-feed an infant while taking this medicine. Men are advised not to father a child while receiving this medicine. This product may contain alcohol. Ask your pharmacist or healthcare provider if this medicine contains alcohol. Be sure to tell all healthcare providers you are  taking this medicine. Certain medicines, like metronidazole and disulfiram, can cause an unpleasant reaction when taken with alcohol. The reaction includes flushing, headache, nausea, vomiting, sweating, and increased thirst. The reaction can last from 30 minutes to several hours. What side effects may I notice from receiving this medicine? Side effects that you should report to your doctor or health care professional as soon as possible: -allergic reactions like skin rash, itching or hives, swelling of the face, lips, or tongue -low blood counts - This drug may decrease the number of white blood cells, red blood cells and platelets. You may be at increased risk for infections and bleeding. -signs of infection - fever or chills, cough, sore throat, pain or difficulty passing urine -signs of decreased platelets or bleeding - bruising, pinpoint red spots on the skin, black, tarry stools, nosebleeds -signs of decreased red blood cells - unusually weak or tired, fainting spells, lightheadedness -breathing problems -chest pain -high or low blood pressure -mouth sores -nausea and vomiting -pain, swelling, redness or irritation at the injection site -pain, tingling, numbness in the hands or feet -slow or irregular heartbeat -swelling of the ankle, feet, hands Side effects that usually do not require medical attention (report to your doctor or health care professional if they continue or are bothersome): -bone pain -complete hair loss including hair on your head, underarms, pubic hair, eyebrows, and eyelashes -changes in the color of fingernails -diarrhea -loosening of the fingernails -loss of appetite -muscle or joint pain -red flush to skin -sweating This list may not describe all possible side effects. Call your doctor for   medical advice about side effects. You may report side effects to FDA at 1-800-FDA-1088. Where should I keep my medicine? This drug is given in a hospital or clinic and  will not be stored at home. NOTE: This sheet is a summary. It may not cover all possible information. If you have questions about this medicine, talk to your doctor, pharmacist, or health care provider.  2018 [employer] Standard (2014-10-19 19:58:00)   Carboplatin injection What is this medicine? CARBOPLATIN (KAR boe pla tin) is a chemotherapy drug. It targets fast dividing cells, like cancer cells, and causes these cells to die. This medicine is used to treat ovarian cancer and many other cancers. This medicine may be used for other purposes; ask your health care provider or pharmacist if you have questions. COMMON BRAND NAME(S): Paraplatin What should I tell my health care provider before I take this medicine? They need to know if you have any of these conditions: -blood disorders -hearing problems -kidney disease -recent or ongoing radiation therapy -an unusual or allergic reaction to carboplatin, cisplatin, other chemotherapy, other medicines, foods, dyes, or preservatives -pregnant or trying to get pregnant -breast-feeding How should I use this medicine? This drug is usually given as an infusion into a vein. It is administered in a hospital or clinic by a specially trained health care professional. Talk to your pediatrician regarding the use of this medicine in children. Special care may be needed. Overdosage: If you think you have taken too much of this medicine contact a poison control center or emergency room at once. NOTE: This medicine is only for you. Do not share this medicine with others. What if I miss a dose? It is important not to miss a dose. Call your doctor or health care professional if you are unable to keep an appointment. What may interact with this medicine? -medicines for seizures -medicines to increase blood counts like filgrastim, pegfilgrastim, sargramostim -some antibiotics like amikacin, gentamicin, neomycin, streptomycin, tobramycin -vaccines Talk to  your doctor or health care professional before taking any of these medicines: -acetaminophen -aspirin -ibuprofen -ketoprofen -naproxen This list may not describe all possible interactions. Give your health care provider a list of all the medicines, herbs, non-prescription drugs, or dietary supplements you use. Also tell them if you smoke, drink alcohol, or use illegal drugs. Some items may interact with your medicine. What should I watch for while using this medicine? Your condition will be monitored carefully while you are receiving this medicine. You will need important blood work done while you are taking this medicine. This drug may make you feel generally unwell. This is not uncommon, as chemotherapy can affect healthy cells as well as cancer cells. Report any side effects. Continue your course of treatment even though you feel ill unless your doctor tells you to stop. In some cases, you may be given additional medicines to help with side effects. Follow all directions for their use. Call your doctor or health care professional for advice if you get a fever, chills or sore throat, or other symptoms of a cold or flu. Do not treat yourself. This drug decreases your body's ability to fight infections. Try to avoid being around people who are sick. This medicine may increase your risk to bruise or bleed. Call your doctor or health care professional if you notice any unusual bleeding. Be careful brushing and flossing your teeth or using a toothpick because you may get an infection or bleed more easily. If you have any dental work done,   tell your dentist you are receiving this medicine. Avoid taking products that contain aspirin, acetaminophen, ibuprofen, naproxen, or ketoprofen unless instructed by your doctor. These medicines may hide a fever. Do not become pregnant while taking this medicine. Women should inform their doctor if they wish to become pregnant or think they might be pregnant. There is a  potential for serious side effects to an unborn child. Talk to your health care professional or pharmacist for more information. Do not breast-feed an infant while taking this medicine. What side effects may I notice from receiving this medicine? Side effects that you should report to your doctor or health care professional as soon as possible: -allergic reactions like skin rash, itching or hives, swelling of the face, lips, or tongue -signs of infection - fever or chills, cough, sore throat, pain or difficulty passing urine -signs of decreased platelets or bleeding - bruising, pinpoint red spots on the skin, black, tarry stools, nosebleeds -signs of decreased red blood cells - unusually weak or tired, fainting spells, lightheadedness -breathing problems -changes in hearing -changes in vision -chest pain -high blood pressure -low blood counts - This drug may decrease the number of white blood cells, red blood cells and platelets. You may be at increased risk for infections and bleeding. -nausea and vomiting -pain, swelling, redness or irritation at the injection site -pain, tingling, numbness in the hands or feet -problems with balance, talking, walking -trouble passing urine or change in the amount of urine Side effects that usually do not require medical attention (report to your doctor or health care professional if they continue or are bothersome): -hair loss -loss of appetite -metallic taste in the mouth or changes in taste This list may not describe all possible side effects. Call your doctor for medical advice about side effects. You may report side effects to FDA at 1-800-FDA-1088. Where should I keep my medicine? This drug is given in a hospital or clinic and will not be stored at home. NOTE: This sheet is a summary. It may not cover all possible information. If you have questions about this medicine, talk to your doctor, pharmacist, or health care provider.  2018 [employer]/Gold  Standard (2007-03-25 14:38:05)

## 2016-12-05 NOTE — Assessment & Plan Note (Signed)
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

## 2016-12-05 NOTE — Progress Notes (Signed)
[facility] OFFICE PROGRESS NOTE  Patient Care Team: Janith Lima, MD as PCP - General (Internal Medicine) Sherren Mocha, MD as Consulting Physician (Cardiology) Jarome Matin, MD as Consulting Physician (Dermatology) Pyrtle, Lajuan Lines, MD as Consulting Physician (Gastroenterology) Katy Apo, MD as Consulting Physician (Ophthalmology) Frederik Schmidt, MD as Consulting Physician (Oral Surgery) [name], MD as Attending Physician (Radiation Oncology) Leota Sauers, RN as Oncology Nurse Navigator Karie Mainland, RD as Dietitian (Nutrition) Dannielle Karvonen, RN as [place] [facility]  SUMMARY OF ONCOLOGIC HISTORY:   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 Tumor measures 0.9 cm in greatest dimension. Tumor extends to the inked deep tissue edge. Depth of invasion: 4 mm (in this material) See note.  Note: an immunostains for P16 was performed at an outside hospital and provided for [place] to review; p16 immunostain is negative. Per outside report, In situ hybridization for high-risk HPV types showed no evidence of transcriptionally active high-risk HPV types.      03/21/2016 Initial Diagnosis    Salient findings:  -EAC's clear -Lesion within left buccal mucosa (pictured below) with about 11mm depth. Width is about 2/3 of the left buccal mucosal surface area by palpation. Extends to the oral commissure.  -Tongue soft and mobile without mucosal lesions -Neck soft with no palpable neck masses -Right handed -Right hand is pink and warm with Allen's test negative (normal)  -Left forearm very sun damaged with multiple treated areas from precancerous or cancerous areas. Right forearm not as dramatic, presumably from sun exposure while driving      1/61/0960 Surgery    RESECTION OF ORAL BUCCAL MUCOSA SELECTIVE NECK  DISSECTION FREE FLAP RADIAL FOREARM PR SPLIT Rudolph <100 SQCM [15100] (SPLIT THICKNESS SKIN GRAFT LEG        04/13/2016 Pathology Results    A.LEFT ORAL COMMISSURE INFERIOR, EXCISION: No malignancy identified.  B.LEFT ORAL COMMISSURE SUPERIOR, EXCISION: No malignancy identified.  C.LEFT DEEP ORAL COMMISSURE, EXCISION: No malignancy identified.  D.RIGHT POSTERIOR BUCCAL INFERIOR, EXCISION: No malignancy identified.  E.RIGHT POSTERIOR BUCCAL SUPERIOR, EXCISION: No malignancy identified.  F.ORAL CAVITY, LEFT BUCCAL, EXCISION:  Invasive squamous cell carcinoma, moderately differentiated,  keratinizing. Tumor size:2.2 cm. Tumor depth of invasion:0.8 cm. Perineural invasion identified. Margins negative for malignancy. Pathologic stage:pT2 pN2a. See tumor protocol summary below.  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 Tumor deposit size:1.1 cm. Extranodal extension present. Benign salivary gland tissue.  SURGICAL PATHOLOGY CANCER CASE SUMMARY (AJCC 8TH EDITION) LIP AND ORAL CAVITY CAP Protocol posting date: June, 2017 Version: LipOralCavity 4.0.0.0  PROCEDURE: Wide resection of buccal mucosa TUMOR SITE:Oral:Buccal mucosa TUMOR LATERALITY: Left TUMOR FOCALITY: Unifocal TUMOR SIZE:Greatest dimension: 2.2 cm TUMOR DEPTH OF INVASION (DOI):8 mm HISTOLOGIC TYPE:Squamous cell carcinoma, conventional HISTOLOGIC GRADE:G2: Moderately differentiated SPECIMEN MARGINS:Uninvolved by invasive carcinoma  DISTANCE FROM CLOSEST MARGIN:1 mm  SPECIFY MARGIN(S):Deep LYMPHOVASCULAR INVASION:Present PERINEURAL INVASION: Present REGIONAL LYMPH NODES: NUMBER OF LYMPH NODES INVOLVED: 1 NUMBER OF LYMPH NODES EXAMINED: 4 LATERALITY OF LYMPH NODES INVOLVED:  Ipsilateral SIZE OF LARGEST METASTATIC DEPOSIT:1.1 cm EXTRANODAL EXTENSION:Present PATHOLOGIC STAGE CLASSIFICATION (pTNM, AJCC 8th Ed): pT2 pN2a pT2: umor >2 cm but <=4 cm and <=10 mm DOI pN2a:Metastasis in a single ipsilateral lymph node, 3 cm or  smaller in greatest dimension and ENE(+) BIOMARKERS:performed on prior outside biopsy [facility] review case [id number], outside case [id number]) P16 (Immunohistochemistry):Negative HPV (high-risk  types by in situ hybridization):Reported as negative      05/11/2016 Imaging    1. 4 mm left lower lobe pulmonary nodule. Attention on follow-up imaging recommended as metastatic disease not excluded. 2. 2.8 cm left thyroid nodule. Thyroid ultrasound recommended to further evaluate. 3. Bilateral renal cysts. 4. Coronary artery and thoracoabdominal aortic atherosclerosis. 5. Ankylosing spondylitis.        05/11/2016 Imaging    CT neck: Surgical clips in the region of the left buccal mucosa, presumably at the previous primary site. Mild scarring in that region measuring about 8 mm. Cannot assess for residual or recurrent disease in that location, but this may simply be scarring. Previous left submandibular resection and left neck node dissection. No abnormal nodes presently. Multinodular goiter. Ankylosing spondylitis.  C1-2 remains a mobile segment.      05/21/2016 Procedure    1. Successful placement of a right internal jugular approach power injectable Port-A-Cath. The Port a catheter is ready for immediate use. 2. Successful fluoroscopic insertion of a 20-French pull-through gastrostomy tube. The gastrostomy may be used immediately for medication administration and may be utilized in 24 hrs for the initiation of feeds.      05/23/2016 - 07/09/2016 Radiation Therapy    Site/dose:     Left Cheek and bilateral neck / 60 Gy in 30 fractions to gross disease, 54 Gy in 30 fractions to intermediate risk nodal echelons Beams/energy:    IMRT / 6 MV photons       05/25/2016 - 06/15/2016 Chemotherapy    He received weekly reduced dose cisplatin       06/21/2016 Adverse Reaction    Cycle 5 is delayed due to pancytopenia      06/26/2016 - 06/29/2016 Hospital Admission    He was admitted for management of neutropenic fever      08/27/2016 Imaging    1. Negative for pneumothorax or pleural effusion 2. Acute right eighth rib fracture. Questionable fracture deformity of the right ninth rib.      09/04/2016 Imaging    Left-sided rib fractures, without pneumothorax or pleural fluid      09/27/2016 Imaging    CT chest 1. Multiple (at least 6) scattered solid pulmonary nodules throughout both lungs, all new since 05/11/2016 chest CT and increased since 08/27/2016 chest CT, compatible with growing pulmonary metastases. 2. No thoracic adenopathy. 3. Additional findings include stable multinodular goiter and cholelithiasis.  Aortic Atherosclerosis (ICD10-I70.0).      09/27/2016 Imaging    1. Unchanged appearance of the left buccal mucosa resection site without evidence of disease recurrence or progression. 2. Unchanged cervical ankylosis. 3. Mild carotid and aortic atherosclerosis (ICD10-I70.0).      10/16/2016 Imaging    No evidence of active pulmonary disease.  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       11/26/2016 PET scan    1. Bilateral hypermetabolic enlarging nodules consistent with metastatic pulmonary nodules. 2. 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 Second metastatic lesion in the RIGHT iliac wing. 3. No evidence of local recurrence within the oral cavity      11/28/2016 - 11/30/2016 Hospital Admission    He was admitted for acute stroke      11/29/2016 Imaging    MRI brain: Large acute LEFT PCA territory infarct affecting much of the LEFT occipital lobe. No superimposed hemorrhage  is evident       INTERVAL HISTORY: Please see  below for problem oriented charting. He returns for further chemotherapy Since discharge from the hospital, he has not had home health arrangement with PT, OT or RN visits He continues to have visual disturbances He bumped his head against a wall several days ago and developed bruises on his face He denies other focal neurological deficits His anticoagulation treatment is switched to Plavix The patient denies any recent signs or symptoms of bleeding such as spontaneous epistaxis, hematuria or hematochezia.  REVIEW OF SYSTEMS:   Constitutional: Denies fevers, chills or abnormal weight loss Eyes: Denies blurriness of vision Ears, nose, mouth, throat, and face: Denies mucositis or sore throat Respiratory: Denies cough, dyspnea or wheezes Cardiovascular: Denies palpitation, chest discomfort or lower extremity swelling Gastrointestinal:  Denies nausea, heartburn or change in bowel habits Skin: Denies abnormal skin rashes Lymphatics: Denies new lymphadenopathy  Neurological:Denies numbness, tingling or new weaknesses Behavioral/Psych: Mood is stable, no new changes  All other systems were reviewed with the patient and are negative.  I have reviewed the past medical history, past surgical history, social history and family history with the patient and they are unchanged from previous note.  ALLERGIES:  has No Known Allergies.  MEDICATIONS:  Current Outpatient Medications  Medication Sig Dispense Refill  . atorvastatin (LIPITOR) 80 MG tablet TAKE 1 TABLET AT 6PM. 90 tablet 2  . balsalazide (COLAZAL) 750 MG capsule TAKE 3 CAPSULES 3 TIMES A DAY. OPEN CAPS & SPRINKLE ON APPLESAUCE OR DISSOLVE IN WATER & TAKE VIA TUBE. 270 capsule 0  . clopidogrel (PLAVIX) 75 MG tablet Take 1 tablet (75 mg total) by mouth daily. 30 tablet 1  . cyanocobalamin (,VITAMIN B-12,) 1000 MCG/ML injection INJECT 1ML ONCE MONTHLY. 3 mL 1  . isosorbide mononitrate  (ISMO,MONOKET) 20 MG tablet Crush 2 tablets (40 mg total) by mouth 2 (two) times daily at 10 AM and 5 PM. (Patient taking differently: Take 40 mg by mouth 2 (two) times daily. Crush 2 tablets (40 mg total) by mouth 2 (two) times daily at 10 AM and 5 PM.) 120 tablet 11  . mercaptopurine (PURINETHOL) 50 MG tablet TAKE ONE TABLET ONCE A DAY ON AN EMPTY STOMACH 1 HR BEFORE OR 2HRS AFTER A MEAL 30 tablet 2  . metFORMIN (GLUCOPHAGE) 500 MG tablet Place 1 tablet (500 mg total) into feeding tube 2 (two) times daily with a meal. 180 tablet 1  . nitroGLYCERIN (NITROSTAT) 0.4 MG SL tablet Place 1 tablet (0.4 mg total) under the tongue every 5 (five) minutes as needed for chest pain. Max 3 doses. In no response call 911. 25 tablet 5  . Nutritional Supplements (FEEDING SUPPLEMENT, OSMOLITE 1.5 CAL,) LIQD Give 1 and 1/2 cans Osmolite 1.5 via PEG 4 times daily with 60 mL free water before and after bolus feedings. In addition, drink or flush PEG with 240 mL free water TID. Send formula and supplies. 6 Bottle 0  . ondansetron (ZOFRAN) 8 MG tablet Take 1 tablet (8 mg total) by mouth every 8 (eight) hours as needed for nausea. 30 tablet 3  . predniSONE (DELTASONE) 5 MG tablet TAKE 2 TABLETS IN THE MORNING WITH BREAKFAST. (Patient taking differently: TAKE 10MG  IN THE MORNING WITH BREAKFAST.) 60 tablet 0  . promethazine (PHENERGAN) 25 MG tablet Take 1 tablet (25 mg total) by mouth every 6 (six) hours as needed for nausea. 30 tablet 3  . traMADol (ULTRAM) 50 MG tablet Take 1 tablet (50 mg total) by mouth every 6 (six) hours as needed for moderate pain.  90 tablet 0   No current facility-administered medications for this visit.    Facility-Administered Medications Ordered in Other Visits  Medication Dose Route Frequency Provider Last Rate Last Dose  . CARBOplatin (PARAPLATIN) 180 mg in sodium chloride 0.9 % 250 mL chemo infusion  180 mg Intravenous Once Alvy Bimler, Moussa Wiegand, MD      . heparin lock flush 100 unit/mL  500 Units  Intracatheter Once PRN Alvy Bimler, Ronaldo Crilly, MD      . PACLitaxel (TAXOL) 84 mg in sodium chloride 0.9 % 250 mL chemo infusion (</= 80mg /m2)  45 mg/m2 (Treatment Plan Recorded) Intravenous Once Madiha Bambrick, MD      . sodium chloride flush (NS) 0.9 % injection 10 mL  10 mL Intracatheter PRN Alvy Bimler, Camerin Ladouceur, MD        PHYSICAL EXAMINATION: ECOG PERFORMANCE STATUS: 1 - Symptomatic but completely ambulatory  Vitals:   12/05/16 1005  BP: 117/60  Pulse: 94  Resp: 18  Temp: 97.7 F (36.5 C)  SpO2: 98%   Filed Weights   12/05/16 1005  Weight: 163 lb 3.2 oz (74 kg)    GENERAL:alert, no distress and comfortable SKIN: skin color, texture, turgor are normal, no rashes or significant lesions.  Noticed skin bruising EYES: normal, Conjunctiva are pink and non-injected, sclera clear OROPHARYNX:no exudate, no erythema and lips, buccal mucosa, and tongue normal  NECK: supple, thyroid normal size, non-tender, without nodularity LYMPH:  no palpable lymphadenopathy in the cervical, axillary or inguinal LUNGS: clear to auscultation and percussion with normal breathing effort HEART: regular rate & rhythm and no murmurs and no lower extremity edema ABDOMEN:abdomen soft, non-tender and normal bowel sounds Musculoskeletal:no cyanosis of digits and no clubbing  NEURO: alert & oriented x 3 with fluent speech, no focal motor/sensory deficits  LABORATORY DATA:  I have reviewed the data as listed    Component Value Date/Time   NA 133 (L) 12/05/2016 0934   K 4.3 12/05/2016 0934   CL 99 (L) 11/30/2016 0505   CO2 29 12/05/2016 0934   GLUCOSE 246 (H) 12/05/2016 0934   GLUCOSE 98 12/12/2005 0729   BUN 20.3 12/05/2016 0934   CREATININE 0.8 12/05/2016 0934   CALCIUM 9.3 12/05/2016 0934   PROT 6.3 (L) 12/05/2016 0934   ALBUMIN 2.9 (L) 12/05/2016 0934   AST 22 12/05/2016 0934   ALT 17 12/05/2016 0934   ALKPHOS 63 12/05/2016 0934   BILITOT 0.63 12/05/2016 0934   GFRNONAA >60 11/30/2016 0505   GFRAA >60 11/30/2016  0505    No results found for: SPEP, UPEP  Lab Results  Component Value Date   WBC 6.9 12/05/2016   NEUTROABS 5.7 12/05/2016   HGB 9.7 (L) 12/05/2016   HCT 29.5 (L) 12/05/2016   MCV 95.5 12/05/2016   PLT 150 12/05/2016      Chemistry      Component Value Date/Time   NA 133 (L) 12/05/2016 0934   K 4.3 12/05/2016 0934   CL 99 (L) 11/30/2016 0505   CO2 29 12/05/2016 0934   BUN 20.3 12/05/2016 0934   CREATININE 0.8 12/05/2016 0934      Component Value Date/Time   CALCIUM 9.3 12/05/2016 0934   ALKPHOS 63 12/05/2016 0934   AST 22 12/05/2016 0934   ALT 17 12/05/2016 0934   BILITOT 0.63 12/05/2016 0934       RADIOGRAPHIC STUDIES: I have personally reviewed the radiological images as listed and agreed with the findings in the report. Ct Angio Head W Or Wo Contrast  Result  Date: 11/28/2016 CLINICAL DATA:  76 year old male acute onset right side hemianopia today. History of prior bilateral cerebellar infarcts. Symptom onset when the patient presented to the cancer center for chemotherapy. EXAM: CT ANGIOGRAPHY HEAD TECHNIQUE: Multidetector CT imaging of the head was performed using the standard protocol during bolus administration of intravenous contrast. Multiplanar CT image reconstructions and MIPs were obtained to evaluate the vascular anatomy. CONTRAST:  58mL ISOVUE-370 IOPAMIDOL (ISOVUE-370) INJECTION 76% COMPARISON:  Head CT without contrast 1322 hours today. FINDINGS: Posterior circulation: Codominant distal vertebral arteries are patent to the vertebrobasilar junction. There is calcified right V4 segment plaque with mild stenosis. There is noncalcified mild to moderate stenosis of the left V4 segment (series 10, image 26). Right PICA origin an dominant appearing left AICA origin are patent. Patent vertebrobasilar junction. Mild to moderate irregularity and stenosis of the midbasilar artery. The distal basilar is patent. SCA (duplicated, normal variant) and PCA origins appear  normal. Posterior communicating arteries are diminutive or absent. The left PCA P1, P2 segments and bifurcation is not patent and normal. The left P3 branches appear within normal limits. Contralateral right PCA branches appear normal. Anterior circulation: Distal cervical ICAs are patent. Tortuous bilateral ICA siphons are patent with mild to moderate left and moderate to severe right side calcified atherosclerosis. There is moderate to severe right ICA supraclinoid segment stenosis. No significant stenosis on the left. Both ophthalmic artery origins are patent. Bilateral carotid termini are patent. MCA and ACA origins are normal. Anterior communicating artery and bilateral ACA branches are within normal limits. Left MCA M1 segment, left MCA trifurcation and left MCA branches are within normal limits. Right MCA M1 segment, bifurcation, and right MCA branches are within normal limits. Venous sinuses: Patent. Anatomic variants: None. Delayed phase: Stable gray-white matter differentiation throughout the brain. No cortically based acute infarct identified. No acute intracranial hemorrhage identified. No midline shift, mass effect, or evidence of intracranial mass lesion. No abnormal enhancement identified. There is probably a small area of chronic encephalomalacia in the posterior right MCA territory, right peri-Rolandic cortex on series 16, image 22 which appears stable since 06/26/2016. Review of the MIP images confirms the above findings IMPRESSION: 1. Negative for emergent large intracranial vessel occlusion. Dr. Lovenia Kim gave a preliminary report of this finding to Dr. Jerelyn Charles. 2. Posterior circulation atherosclerosis including multifocal mild to moderate bilateral distal vertebral and basilar artery stenosis. The left PCA appears normal. 3. Stable CT appearance of the brain from earlier today. No acute or evolving infarct identified. 4. Bulky right ICA siphon calcified atherosclerosis with moderate or  severe supraclinoid ICA stenosis. 5. Elsewhere the anterior circulation is within normal limits. Electronically Signed   By: Genevie Ann M.D.   On: 11/28/2016 15:09   Mr Brain Wo Contrast  Result Date: 11/29/2016 CLINICAL DATA:  RIGHT hemianopsia.  Acute onset. EXAM: MRI HEAD WITHOUT CONTRAST TECHNIQUE: Multiplanar, multiecho pulse sequences of the brain and surrounding structures were obtained without intravenous contrast. COMPARISON:  CT head 11/28/2016.  CTA head 11/28/2016. FINDINGS: Brain: Large area of restricted diffusion involves much of the LEFT occipital lobe, including the calcarine cortex, corresponding low ADC, consistent with acute infarction. No mass lesion, hemorrhage, hydrocephalus, or extra-axial fluid. Generalized atrophy. Chronic microvascular ischemic change. Scattered areas of chronic lacunar infarction affect the cerebral and cerebellar hemispheres. Vascular: Flow voids are maintained, specifically in the BILATERAL vertebral and basilar arteries. LEFT PCA appears patent as well. Skull and upper cervical spine: Post treatment marrow changes of the skull  base and upper cervical region. Sinuses/Orbits: No layering fluid or significant opacity. Normal orbits. Other: Unremarkable. IMPRESSION: Large acute LEFT PCA territory infarct affecting much of the LEFT occipital lobe. No superimposed hemorrhage is evident. Electronically Signed   By: Staci Righter M.D.   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 Patient status post chemo radiation therapy. EXAM: NUCLEAR MEDICINE PET SKULL BASE TO THIGH TECHNIQUE: 7.6 mCi F-18 FDG was injected intravenously. Full-ring PET imaging was performed from the skull base to thigh after the radiotracer. CT data was obtained and used for attenuation correction and anatomic localization. FASTING  BLOOD GLUCOSE:  Value: 264 mg/dl COMPARISON:  CT chest 09/27/2016 FINDINGS: NECK No residual hypermetabolic activity in the oral cavity. No hypermetabolic lymph nodes in the neck. Postsurgical change in the LEFT submandibular neck. CHEST There are several solid pulmonary nodules increased in size from comparison CT and with associated metabolic activity. For example: RIGHT upper lobe nodule measuring 13 mm with SUV max 4.5. 16  mm RIGHT lower lobe nodule with SUV max equal 6.0 19 mm nodule in the medial LEFT lower lobe with SUV max 5.5. These 3 nodules are on same image slice (image 37, series 2). ABDOMEN/PELVIS Percutaneous gastrostomy tube in the stomach. Gallstones noted. No hypermetabolic lesion in the liver and. Normal adrenal glands. No hypermetabolic abdominopelvic nodes.  Prostate gland SKELETON Expansile metastatic lesion within the anteromedial aspect of the right eighth rib with soft tissue component measuring 3.5 by 4.0 cm and with intense peripheral metabolic activity (SUV max 7.4). Second skeletal metastasis within the RIGHT iliac wing associated with lesion lytic lesion and SUV max equal 6.6. IMPRESSION: 1. Bilateral hypermetabolic enlarging nodules consistent with metastatic pulmonary nodules. 2. 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 Second metastatic lesion in the RIGHT iliac wing. 3. No evidence of local recurrence within the oral cavity Electronically Signed   By: Suzy Bouchard M.D.   On: 11/26/2016 16:05   Ct Head Code Stroke Wo Contrast  Result Date: 11/28/2016 CLINICAL DATA:  Code stroke. 76 year old male scheduled for chemotherapy today, acute onset loss of right side vision in both eyes. A fib. EXAM: CT HEAD WITHOUT CONTRAST TECHNIQUE: Contiguous axial images were obtained from the base of the skull through the vertex without intravenous contrast. COMPARISON:  Neck CT 09/27/2016.  Head CT 06/26/2016 and earlier.  FINDINGS: Brain: Stable cerebral volume. No acute intracranial hemorrhage identified. No midline shift, mass effect, or evidence of intracranial mass lesion. No ventriculomegaly. 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 Comparatively mild patchy bilateral white matter hypodensity appears stable. No cortically based acute infarct identified. Vascular: Calcified atherosclerosis at the skull base. No suspicious intracranial vascular hyperdensity. Skull: Degenerative changes in the upper cervical spine. No acute osseous abnormality identified. Sinuses/Orbits: Clear. Other: Orbits soft tissues appear stable and normal. Visualized scalp soft tissues are within normal limits. ASPECTS [facility] Stroke Program Early CT Score) - Ganglionic level infarction (caudate, lentiform nuclei, internal capsule, insula, M1-M3 cortex): 7 - Supraganglionic infarction (M4-M6 cortex): 3 Total score (0-10 with 10 being normal): 10 IMPRESSION: 1. No acute cortically based infarct or acute intracranial hemorrhage identified. 2. ASPECTS is 10. 3. 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 4. Study discussed by  telephone with Dr. Carmin Muskrat on 11/28/2016 at 13:39 . Electronically Signed   By: Genevie Ann M.D.   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HCC) We will proceed with carboplatin and Taxol as discussed I will see him on a weekly basis for supportive care  Acute cardioembolic stroke [facility]) He continues to have right hemianopia with significant visual difficulties He bumped into objects recently We will get in touch with advanced home care to set up home health, OT and PT He has appointment to follow with neurologist next month.  Multiple pulmonary nodules He has multiple enlarging pulmonary nodules, worrisome for metastatic disease He is not  symptomatic Continue close observation   No orders of the defined types were placed in this encounter.  All questions were answered. The patient knows to call the clinic with any problems, questions or concerns. No barriers to learning was detected. I spent 15 minutes counseling the patient face to face. The total time spent in the appointment was 20 minutes and more than 50% was on counseling and review of test results     [name], MD 12/05/2016 2:12 PM

## 2016-12-05 NOTE — Patient Outreach (Signed)
[facility]) Care Management  12/05/2016  Andres Smith 1940-05-21 [id number]   EMMI: stroke Referral date: 12/05/16 Referral source: EMMI stroke red alert Referral reason: Problems setting up rehab: yes Day # 1  Telephone call to patient regarding EMMI stroke red alert. Unable to reach patient. HIPAA compliant voice message left with call back phone number.   PLAN:  RNCM will attempt 2nd telephone call to patient within 3 business days.   Quinn Plowman RN,BSN,CCM [facility] Telephonic  [phone]

## 2016-12-05 NOTE — Telephone Encounter (Signed)
Printed avs and calender for up coming appointment. Per 12/5 los 

## 2016-12-05 NOTE — Assessment & Plan Note (Signed)
He has multiple enlarging pulmonary nodules, worrisome for metastatic disease He is not symptomatic Continue close observation

## 2016-12-05 NOTE — Assessment & Plan Note (Signed)
We will proceed with carboplatin and Taxol as discussed I will see him on a weekly basis for supportive care

## 2016-12-06 ENCOUNTER — Other Ambulatory Visit: Payer: Self-pay

## 2016-12-06 ENCOUNTER — Other Ambulatory Visit: Payer: Self-pay | Admitting: Nurse Practitioner

## 2016-12-06 DIAGNOSIS — I639 Cerebral infarction, unspecified: Secondary | ICD-10-CM

## 2016-12-06 DIAGNOSIS — I4891 Unspecified atrial fibrillation: Secondary | ICD-10-CM

## 2016-12-06 NOTE — Patient Outreach (Signed)
[facility]) Care Management  12/06/2016  Andres Smith 1940/10/10 [id number]   EMMI: stroke Referral date: 12/05/16 Referral source: EMMI stroke red alert Referral reason: Problems setting up rehab: yes Day # 1 Attempt #2  Telephone call to patient regarding EMMI stroke red alert. Patient request call back at another time. Patient states, "this is not a good time."  PLAN:  RNCM will attempt 3rd telephone call with patient within 3 business days.   Quinn Plowman RN,BSN,CCM [facility] Telephonic  [phone]

## 2016-12-07 ENCOUNTER — Ambulatory Visit (HOSPITAL_BASED_OUTPATIENT_CLINIC_OR_DEPARTMENT_OTHER): Payer: PPO | Admitting: Medical

## 2016-12-07 ENCOUNTER — Ambulatory Visit (INDEPENDENT_AMBULATORY_CARE_PROVIDER_SITE_OTHER): Payer: PPO

## 2016-12-07 ENCOUNTER — Other Ambulatory Visit: Payer: Self-pay

## 2016-12-07 ENCOUNTER — Telehealth: Payer: Self-pay

## 2016-12-07 ENCOUNTER — Other Ambulatory Visit (HOSPITAL_COMMUNITY)
Admission: RE | Admit: 2016-12-07 | Discharge: 2016-12-07 | Disposition: A | Discharge status: Home / Self Care | Payer: PPO | Source: Other Acute Inpatient Hospital | Attending: Medical | Admitting: Medical

## 2016-12-07 ENCOUNTER — Encounter: Payer: Self-pay | Admitting: *Deleted

## 2016-12-07 VITALS — BP 122/71 | HR 81 | Temp 98.0°F | Resp 18 | Ht 68.0 in | Wt 162.9 lb

## 2016-12-07 DIAGNOSIS — I639 Cerebral infarction, unspecified: Secondary | ICD-10-CM

## 2016-12-07 DIAGNOSIS — R911 Solitary pulmonary nodule: Secondary | ICD-10-CM

## 2016-12-07 DIAGNOSIS — I4891 Unspecified atrial fibrillation: Secondary | ICD-10-CM

## 2016-12-07 DIAGNOSIS — X58XXXA Exposure to other specified factors, initial encounter: Secondary | ICD-10-CM | POA: Diagnosis not present

## 2016-12-07 DIAGNOSIS — C7951 Secondary malignant neoplasm of bone: Secondary | ICD-10-CM

## 2016-12-07 DIAGNOSIS — T148XXA Other injury of unspecified body region, initial encounter: Secondary | ICD-10-CM | POA: Diagnosis not present

## 2016-12-07 DIAGNOSIS — C060 Malignant neoplasm of cheek mucosa: Secondary | ICD-10-CM | POA: Diagnosis not present

## 2016-12-07 DIAGNOSIS — L24A9 Irritant contact dermatitis due friction or contact with other specified body fluids: Secondary | ICD-10-CM

## 2016-12-07 MED ORDER — AMOXICILLIN-POT CLAVULANATE 400-57 MG/5ML PO SUSR: 10.0000 mL | Freq: Two times a day (BID) | ORAL | 0 refills | Status: AC

## 2016-12-07 NOTE — Progress Notes (Signed)
Pt here as he states he is seeing drainage around PEG tube at stoma site.  Dressing was changed last night and has yellow drainage and dark stringy mucous. Swabbed site for wound culture. Gayleen Orem, RN Navigator up to examine.

## 2016-12-07 NOTE — Progress Notes (Signed)
Oncology Nurse Navigator Documentation  At request of RN Beth, met with Mr. Trefz and his wife in [facility] to assess PEG which patient indicated "is leaking". Dressing moderately saturated with nutritional supplement, center opening had suspicious odor.  RN Beth collected culture swab. Insertion site unremarkable with exception of small granulation inferiorly.  No evidence of discharge, site lightly erythematous. Retention ring excessively loose at 4 cm mark.  After cleaning site with NS and redressing with split gauze, I moved to 3 cm. I encouraged pt to contact me if further evaluation/care needed.  He voiced understanding.  Gayleen Orem, RN, BSN, [place] Oncology Nurse [place] [facility] [place] [phone]

## 2016-12-07 NOTE — Telephone Encounter (Signed)
Called wife back. Sandi Mealy, PA will see this afternoon. They will be here by 3:30 for appt.

## 2016-12-07 NOTE — Telephone Encounter (Signed)
Wife called and left message wanting patient to be seen before the snow starts tomorrow. Tube feeding started leaking yesterday.

## 2016-12-07 NOTE — Progress Notes (Signed)
Symptoms Management Clinic Progress Note   Andres Smith [id number] 12-07-1940 76 y.o.  Andres Smith is managed by Dr. [name]  Actively treated with chemotherapy: no  Assessment: Plan:    Drainage from wound - Plan: Culture, Wound, amoxicillin-clavulanate (AUGMENTIN) 400-57 MG/5ML suspension   Drainage at insertion site of a left upper quadrant peg tube: Button at the base of the patients peg tube was adjusted by Gayleen Orem. Wound culture was collected. The patient was given a prescription for Augmentin 400-57 mg/29ml, 10 ml via tube BID prn for fevers, chills, sweats, or increased exudate from peg tube site.  Please see After Visit Summary for patient specific instructions.  Future Appointments  Date Time Provider [place]  12/10/2016 10:30 AM Janith Lima, MD [facility]  12/12/2016  9:15 AM [facility] LAB 3 [facility] None  12/12/2016  9:30 AM [facility] INJ NURSE [facility] None  12/12/2016 10:00 AM [name], MD [facility] None  12/12/2016 11:00 AM Sharen Counter, CCC-SLP [facility]  12/12/2016 11:45 AM [facility] E14 [facility] None  12/12/2016 11:45 AM Karie Mainland, RD [facility] None  12/18/2016  1:30 PM Pyrtle, Lajuan Lines, MD [facility]  12/19/2016 10:45 AM [facility] LAB ONLY [facility] None  12/19/2016 11:00 AM [facility] A3 [facility] None  12/19/2016 11:45 AM [name], MD [facility] None  12/19/2016  1:00 PM [facility] F19 [facility] None  01/23/2017 12:15 PM Patsey Berthold, NP [facility]  01/31/2017  2:30 PM Garvin Fila, MD [facility] None    Orders Placed This Encounter  Procedures  . Culture, Wound       Subjective:   Patient ID:  Andres Smith is a 76 y.o. (DOB 09/02/1940) male.  Chief Complaint:  Chief Complaint  Patient presents with  . 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 He was treated with radiation therapy to the left cheek and bilateral neck from 05/23/2016 through 07/09/2016.  He also received weekly reduced dose cisplatin from 05/25/2016 through 06/15/2016.  Cycle 5 of chemotherapy was delayed secondary to pancytopenia.  He restaging CT scan completed on 09/27/2016 showed multiple scattered solid pulmonary nodules throughout both lungs which are all new since his CT scan of 05/11/2016 and had increased since his CT scan of the chest completed on 08/27/2016.  The patient's medical course has been complicated by an acute stroke on 11/28/2016 with an MRI showing a large acute left PCA territory infarct affecting most of the left occipital lobe.  Her office was contacted on 12/07/2016 with a report that there was leakage around the patient's feeding tube.  Medications: I have reviewed the patient's current medications.  Allergies: No Known Allergies  Past Medical History:  Diagnosis Date  . Ankylosing spondylitis ([place])   . Blood transfusion    hx of transfusion without reaction  . Bradycardia   . Cancer ([place]) 03/2016   oral  . Cholelithiasis   . Coronary artery disease    a. s/p CABG x 6 in 1997 (LIMA->LAD, VG->RI ->OM1->OM2, VG->PDA->PLV // b. 05/2011 Cath:  patent grafs, native prox rca and d2 dzs  ->med rx. // c. Myoview 1/18: not gated, large inferolateral scar, no  ischemia; Intermediate Risk (IL scar old - on prior studies >> med rx)  . Crohn's colitis ([place]) 12/01/1997  . Diabetes mellitus   . Dysphagia   . Fatty liver   . GERD (gastroesophageal reflux disease)   . Heart block   . Hiatal hernia 12/03/2008  . History of radiation therapy 05/23/16- 07/09/16   Left Cheek/ 60 Gy in 30 fractions to gross disease, 63 Gy in 35  fractions to high risk nodal echelons, and 56 Gy in 35 fractions to intermedicate risk nodal echelons.   . History of skin cancer   . Hx of echocardiogram    Echo 5/14:  Mild LVH, EF 55-60%, NL diast fxn, mild LAE   . Hyperlipidemia   . Hypertension   . Kidney stones   . Neck pain   . Pneumonia    hx of PNA  . Squamous cell cancer of buccal mucosa ([place])   . 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 cancer removal  2014   removed from left outter leg  . COLONOSCOPY  2012  . CORONARY ARTERY BYPASS GRAFT  04/19/1995   x7  . EXCISION ORAL TUMOR Left    left jaw and lymph node  . IR FLUORO GUIDE PORT INSERTION RIGHT  05/21/2016  . IR GASTROSTOMY TUBE MOD SED  05/21/2016  . IR US GUIDE VASC ACCESS RIGHT  05/21/2016  . LEFT HEART CATHETERIZATION WITH CORONARY/GRAFT ANGIOGRAM N/A 05/24/2011   Procedure: LEFT HEART CATHETERIZATION WITH Beatrix Fetters;  Surgeon: Larey Dresser, MD;  Location[facility] CATH LAB;  Service: Cardiovascular;  Laterality: N/A;  . MOHS SURGERY     X 2 off chin and nose  . TONSILLECTOMY AND ADENOIDECTOMY  1944  . TRIGGER FINGER RELEASE     x 2  . UMBILICAL HERNIA REPAIR      Family History  Problem Relation Age of Onset  . Heart disease Father   . Heart disease Mother   . Arthritis Mother   . Heart disease Unknown   . Crohn's disease Unknown   . Heart disease Sister   . Colon cancer Paternal Grandmother     Social History   Socioeconomic History  . Marital status: Married    Spouse name: Not on file  . Number of children: 2  . Years of education: Not on file  . Highest education level: Not on file  Social Needs  . Financial resource strain: Not on file  . Food insecurity - worry: Not on file  . Food insecurity - inability: Not on file  . Transportation needs - medical: Not on file  . Transportation needs - non-medical: Not on file  Occupational History  . Occupation:  semi retired    Fish farm manager: SELF EMPLOYED  Tobacco Use  . Smoking status: Former Smoker    Last attempt to quit: 01/02/1983    Years since quitting: 33.9  . Smokeless tobacco: Former Systems developer    Types: [place] date: 01/01/2010  Substance and Sexual Activity  . Alcohol use: Yes    Comment: tottie every night  . Drug use: No  . Sexual activity: Not Currently  Other Topics Concern  . Not on file  Social History Narrative   HSG, [employer] - Civil engineering/education in industrial, [place] from [place]. Married '64. 1 son '74, 1 dtr - '69; 1 grandchild, 1 on the way. 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 Lives with wife. ACP -             Past Medical History, Surgical history, Social history, and Family history were reviewed and updated as appropriate.   Please see review of systems for further details on the patient's review from today.   Review of Systems:  Review of Systems  Constitutional: Negative for chills, diaphoresis and fever.  HENT: Positive for trouble swallowing.   Gastrointestinal:       Discharge around the insertion site of the peg-tube.     Objective:   Physical Exam:  BP 122/71 (BP Location: Left Arm, Patient Position: Sitting)   Pulse 81   Temp 98 F (36.7 C) (Oral)   Resp 18   Ht 5\' 8"  (1.727 m)   Wt 162 lb 14.4 oz (73.9 kg)   SpO2 98%   BMI 24.77 kg/m  ECOG: 1  Physical Exam  Constitutional: No distress.  HENT:  Head: Normocephalic and atraumatic.  Abdominal: Soft. Bowel sounds are normal. He exhibits no distension and no mass. There is no tenderness. There is no rebound and no guarding.    Skin: He is not diaphoretic.    Lab Review:     Component Value Date/Time   NA 133 (L) 12/05/2016 0934   K 4.3 12/05/2016 0934   CL 99 (L) 11/30/2016 0505    CO2 29 12/05/2016 0934   GLUCOSE 246 (H) 12/05/2016 0934   GLUCOSE 98 12/12/2005 0729   BUN 20.3 12/05/2016 0934   CREATININE 0.8 12/05/2016 0934   CALCIUM 9.3 12/05/2016 0934   PROT 6.3 (L) 12/05/2016 0934   ALBUMIN 2.9 (L) 12/05/2016 0934   AST 22 12/05/2016 0934   ALT 17 12/05/2016 0934   ALKPHOS 63 12/05/2016 0934   BILITOT 0.63 12/05/2016 0934   GFRNONAA >60 11/30/2016 0505   GFRAA >60 11/30/2016 0505       Component Value Date/Time   WBC 6.9 12/05/2016 0934   WBC 7.0 11/30/2016 0505   RBC 3.09 (L) 12/05/2016 0934   RBC 3.49 (L) 11/30/2016 0505   HGB 9.7 (L) 12/05/2016 0934   HCT 29.5 (L) 12/05/2016 0934   PLT 150 12/05/2016 0934   MCV 95.5 12/05/2016 0934   MCH 31.4 12/05/2016 0934   MCH 31.5 11/30/2016 0505   MCHC 32.9 12/05/2016 0934   MCHC 34.0 11/30/2016 0505   RDW 14.9 (H) 12/05/2016 0934   LYMPHSABS 0.4 (L) 12/05/2016 0934   MONOABS 0.8 12/05/2016 0934   EOSABS 0.1 12/05/2016 0934   BASOSABS 0.0 12/05/2016 0934   -------------------------------  Imaging from last 24 hours (if applicable):  Radiology interpretation: Ct Angio Head W Or Wo Contrast  Result Date: 11/28/2016 CLINICAL DATA:  76 year old male acute onset right side hemianopia today. History of prior bilateral cerebellar infarcts. Symptom onset when the patient presented to the cancer center for chemotherapy. EXAM: CT ANGIOGRAPHY HEAD TECHNIQUE: Multidetector CT imaging of the head was performed using the standard protocol during bolus administration of intravenous contrast. Multiplanar CT image reconstructions and MIPs were obtained to evaluate the vascular anatomy. CONTRAST:  19mL ISOVUE-370 IOPAMIDOL (ISOVUE-370) INJECTION 76% COMPARISON:  Head CT without contrast 1322 hours today. FINDINGS: Posterior circulation: Codominant distal vertebral arteries are patent to the vertebrobasilar junction. There is calcified right V4 segment plaque with mild stenosis. There is noncalcified mild to moderate  stenosis  of the left V4 segment (series 10, image 26). Right PICA origin an dominant appearing left AICA origin are patent. Patent vertebrobasilar junction. Mild to moderate irregularity and stenosis of the midbasilar artery. The distal basilar is patent. SCA (duplicated, normal variant) and PCA origins appear normal. Posterior communicating arteries are diminutive or absent. The left PCA P1, P2 segments and bifurcation is not patent and normal. The left P3 branches appear within normal limits. Contralateral right PCA branches appear normal. Anterior circulation: Distal cervical ICAs are patent. Tortuous bilateral ICA siphons are patent with mild to moderate left and moderate to severe right side calcified atherosclerosis. There is moderate to severe right ICA supraclinoid segment stenosis. No significant stenosis on the left. Both ophthalmic artery origins are patent. Bilateral carotid termini are patent. MCA and ACA origins are normal. Anterior communicating artery and bilateral ACA branches are within normal limits. Left MCA M1 segment, left MCA trifurcation and left MCA branches are within normal limits. Right MCA M1 segment, bifurcation, and right MCA branches are within normal limits. Venous sinuses: Patent. Anatomic variants: None. Delayed phase: Stable gray-white matter differentiation throughout the brain. No cortically based acute infarct identified. No acute intracranial hemorrhage identified. No midline shift, mass effect, or evidence of intracranial mass lesion. No abnormal enhancement identified. There is probably a small area of chronic encephalomalacia in the posterior right MCA territory, right peri-Rolandic cortex on series 16, image 22 which appears stable since 06/26/2016. Review of the MIP images confirms the above findings IMPRESSION: 1. Negative for emergent large intracranial vessel occlusion. Dr. Lovenia Kim gave a preliminary report of this finding to Dr. Jerelyn Charles. 2. Posterior circulation  atherosclerosis including multifocal mild to moderate bilateral distal vertebral and basilar artery stenosis. The left PCA appears normal. 3. Stable CT appearance of the brain from earlier today. No acute or evolving infarct identified. 4. Bulky right ICA siphon calcified atherosclerosis with moderate or severe supraclinoid ICA stenosis. 5. Elsewhere the anterior circulation is within normal limits. Electronically Signed   By: Genevie Ann M.D.   On: 11/28/2016 15:09   Mr Brain Wo Contrast  Result Date: 11/29/2016 CLINICAL DATA:  RIGHT hemianopsia.  Acute onset. EXAM: MRI HEAD WITHOUT CONTRAST TECHNIQUE: Multiplanar, multiecho pulse sequences of the brain and surrounding structures were obtained without intravenous contrast. COMPARISON:  CT head 11/28/2016.  CTA head 11/28/2016. FINDINGS: Brain: Large area of restricted diffusion involves much of the LEFT occipital lobe, including the calcarine cortex, corresponding low ADC, consistent with acute infarction. No mass lesion, hemorrhage, hydrocephalus, or extra-axial fluid. Generalized atrophy. Chronic microvascular ischemic change. Scattered areas of chronic lacunar infarction affect the cerebral and cerebellar hemispheres. Vascular: Flow voids are maintained, specifically in the BILATERAL vertebral and basilar arteries. LEFT PCA appears patent as well. Skull and upper cervical spine: Post treatment marrow changes of the skull base and upper cervical region. Sinuses/Orbits: No layering fluid or significant opacity. Normal orbits. Other: Unremarkable. IMPRESSION: Large acute LEFT PCA territory infarct affecting much of the LEFT occipital lobe. No superimposed hemorrhage is evident. Electronically Signed   By: Staci Righter M.D.   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 Patient status post chemo  radiation therapy. EXAM: NUCLEAR MEDICINE PET SKULL BASE TO THIGH TECHNIQUE: 7.6 mCi F-18 FDG was injected intravenously. Full-ring PET imaging was performed from the skull base to thigh  after the radiotracer. CT data was obtained and used for attenuation correction and anatomic localization. FASTING BLOOD GLUCOSE:  Value: 264 mg/dl COMPARISON:  CT chest 09/27/2016 FINDINGS: NECK No residual hypermetabolic activity in the oral cavity. No hypermetabolic lymph nodes in the neck. Postsurgical change in the LEFT submandibular neck. CHEST There are several solid pulmonary nodules increased in size from comparison CT and with associated metabolic activity. For example: RIGHT upper lobe nodule measuring 13 mm with SUV max 4.5. 16  mm RIGHT lower lobe nodule with SUV max equal 6.0 19 mm nodule in the medial LEFT lower lobe with SUV max 5.5. These 3 nodules are on same image slice (image 37, series 2). ABDOMEN/PELVIS Percutaneous gastrostomy tube in the stomach. Gallstones noted. No hypermetabolic lesion in the liver and. Normal adrenal glands. No hypermetabolic abdominopelvic nodes.  Prostate gland SKELETON Expansile metastatic lesion within the anteromedial aspect of the right eighth rib with soft tissue component measuring 3.5 by 4.0 cm and with intense peripheral metabolic activity (SUV max 7.4). Second skeletal metastasis within the RIGHT iliac wing associated with lesion lytic lesion and SUV max equal 6.6. IMPRESSION: 1. Bilateral hypermetabolic enlarging nodules consistent with metastatic pulmonary nodules. 2. 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 Second metastatic lesion in the RIGHT iliac wing. 3. No evidence of local recurrence within the oral cavity Electronically Signed   By: Suzy Bouchard M.D.   On: 11/26/2016 16:05   Ct Head Code Stroke Wo Contrast  Result Date: 11/28/2016 CLINICAL DATA:  Code stroke. 76 year old male scheduled for  chemotherapy today, acute onset loss of right side vision in both eyes. A fib. EXAM: CT HEAD WITHOUT CONTRAST TECHNIQUE: Contiguous axial images were obtained from the base of the skull through the vertex without intravenous contrast. COMPARISON:  Neck CT 09/27/2016.  Head CT 06/26/2016 and earlier. FINDINGS: Brain: Stable cerebral volume. No acute intracranial hemorrhage identified. No midline shift, mass effect, or evidence of intracranial mass lesion. No ventriculomegaly. 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 Comparatively mild patchy bilateral white matter hypodensity appears stable. No cortically based acute infarct identified. Vascular: Calcified atherosclerosis at the skull base. No suspicious intracranial vascular hyperdensity. Skull: Degenerative changes in the upper cervical spine. No acute osseous abnormality identified. Sinuses/Orbits: Clear. Other: Orbits soft tissues appear stable and normal. Visualized scalp soft tissues are within normal limits. ASPECTS [facility] Stroke Program Early CT Score) - Ganglionic level infarction (caudate, lentiform nuclei, internal capsule, insula, M1-M3 cortex): 7 - Supraganglionic infarction (M4-M6 cortex): 3 Total score (0-10 with 10 being normal): 10 IMPRESSION: 1. No acute cortically based infarct or acute intracranial hemorrhage identified. 2. ASPECTS is 10. 3. 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 4. Study discussed by telephone with Dr. Carmin Muskrat on 11/28/2016 at 13:39 . Electronically Signed   By: Genevie Ann M.D.   On: 11/28/2016 13:39

## 2016-12-07 NOTE — Telephone Encounter (Signed)
I am overbooked already Please see if symptom management clinic can see him today

## 2016-12-07 NOTE — Patient Outreach (Signed)
[place] [facility]) Care Management  12/07/2016  Andres Smith 12-02-1940 [id number]  EMMI:stroke Referral date:12/05/16 Referral source:EMMI stroke red alert Referral reason:Problems setting up rehab: yes Day #1 Attempt #3  Telephone call to patient regarding EMMI stroke red alert. Patient states he is unable to talk at this time because he is leaving for a doctors appointment.   PLAN: RNCM gave patient  Contact phone number and requested return call. RNCM will send outreach letter to patient for contact.   Quinn Plowman RN,BSN,CCM [facility] Telephonic  [phone]

## 2016-12-08 DIAGNOSIS — I2510 Atherosclerotic heart disease of native coronary artery without angina pectoris: Secondary | ICD-10-CM | POA: Diagnosis not present

## 2016-12-08 DIAGNOSIS — Z79899 Other long term (current) drug therapy: Secondary | ICD-10-CM | POA: Diagnosis not present

## 2016-12-08 DIAGNOSIS — E785 Hyperlipidemia, unspecified: Secondary | ICD-10-CM | POA: Diagnosis not present

## 2016-12-08 DIAGNOSIS — C7951 Secondary malignant neoplasm of bone: Secondary | ICD-10-CM | POA: Diagnosis not present

## 2016-12-08 DIAGNOSIS — C7800 Secondary malignant neoplasm of unspecified lung: Secondary | ICD-10-CM | POA: Diagnosis not present

## 2016-12-08 DIAGNOSIS — I69991 Dysphagia following unspecified cerebrovascular disease: Secondary | ICD-10-CM | POA: Diagnosis not present

## 2016-12-08 DIAGNOSIS — H53461 Homonymous bilateral field defects, right side: Secondary | ICD-10-CM | POA: Diagnosis not present

## 2016-12-08 DIAGNOSIS — Z8673 Personal history of transient ischemic attack (TIA), and cerebral infarction without residual deficits: Secondary | ICD-10-CM | POA: Diagnosis not present

## 2016-12-08 DIAGNOSIS — Z85828 Personal history of other malignant neoplasm of skin: Secondary | ICD-10-CM | POA: Diagnosis not present

## 2016-12-08 DIAGNOSIS — E119 Type 2 diabetes mellitus without complications: Secondary | ICD-10-CM | POA: Diagnosis not present

## 2016-12-08 DIAGNOSIS — I69291 Dysphagia following other nontraumatic intracranial hemorrhage: Secondary | ICD-10-CM | POA: Diagnosis not present

## 2016-12-08 DIAGNOSIS — R1310 Dysphagia, unspecified: Secondary | ICD-10-CM | POA: Diagnosis not present

## 2016-12-08 DIAGNOSIS — Z951 Presence of aortocoronary bypass graft: Secondary | ICD-10-CM | POA: Diagnosis not present

## 2016-12-08 DIAGNOSIS — D509 Iron deficiency anemia, unspecified: Secondary | ICD-10-CM | POA: Diagnosis not present

## 2016-12-08 DIAGNOSIS — Z87891 Personal history of nicotine dependence: Secondary | ICD-10-CM | POA: Diagnosis not present

## 2016-12-08 DIAGNOSIS — M459 Ankylosing spondylitis of unspecified sites in spine: Secondary | ICD-10-CM | POA: Diagnosis not present

## 2016-12-08 DIAGNOSIS — C069 Malignant neoplasm of mouth, unspecified: Secondary | ICD-10-CM | POA: Diagnosis not present

## 2016-12-08 DIAGNOSIS — Z431 Encounter for attention to gastrostomy: Secondary | ICD-10-CM | POA: Diagnosis not present

## 2016-12-08 DIAGNOSIS — Z7984 Long term (current) use of oral hypoglycemic drugs: Secondary | ICD-10-CM | POA: Diagnosis not present

## 2016-12-08 DIAGNOSIS — I10 Essential (primary) hypertension: Secondary | ICD-10-CM | POA: Diagnosis not present

## 2016-12-08 DIAGNOSIS — M21371 Foot drop, right foot: Secondary | ICD-10-CM | POA: Diagnosis not present

## 2016-12-10 ENCOUNTER — Inpatient Hospital Stay: Payer: PPO | Admitting: Internal Medicine

## 2016-12-11 ENCOUNTER — Ambulatory Visit: Payer: PPO

## 2016-12-11 LAB — AEROBIC CULTURE  (SUPERFICIAL SPECIMEN)

## 2016-12-11 LAB — AEROBIC CULTURE W GRAM STAIN (SUPERFICIAL SPECIMEN)

## 2016-12-11 NOTE — Progress Notes (Signed)
These results were called to Andres Smith and were reviewed with him . He was told to start Augmentin that was prescribed last week. He continues to have leakage at the peg insertion site. He has an appointment tomorrow. He expressed understanding with these instructions. Lucianne Lei.

## 2016-12-12 ENCOUNTER — Ambulatory Visit: Payer: PPO

## 2016-12-12 ENCOUNTER — Telehealth: Payer: Self-pay | Admitting: Internal Medicine

## 2016-12-12 ENCOUNTER — Ambulatory Visit (HOSPITAL_BASED_OUTPATIENT_CLINIC_OR_DEPARTMENT_OTHER): Payer: PPO

## 2016-12-12 ENCOUNTER — Encounter: Payer: Self-pay | Admitting: Hematology and Oncology

## 2016-12-12 ENCOUNTER — Ambulatory Visit: Payer: PPO | Admitting: Nutrition

## 2016-12-12 ENCOUNTER — Telehealth: Payer: Self-pay | Admitting: Hematology and Oncology

## 2016-12-12 ENCOUNTER — Other Ambulatory Visit (HOSPITAL_BASED_OUTPATIENT_CLINIC_OR_DEPARTMENT_OTHER): Payer: PPO

## 2016-12-12 ENCOUNTER — Ambulatory Visit: Payer: PPO | Attending: Radiation Oncology

## 2016-12-12 ENCOUNTER — Encounter: Payer: PPO | Admitting: Nutrition

## 2016-12-12 ENCOUNTER — Ambulatory Visit (HOSPITAL_BASED_OUTPATIENT_CLINIC_OR_DEPARTMENT_OTHER): Payer: PPO | Admitting: Hematology and Oncology

## 2016-12-12 DIAGNOSIS — Z5111 Encounter for antineoplastic chemotherapy: Secondary | ICD-10-CM | POA: Diagnosis not present

## 2016-12-12 DIAGNOSIS — R1310 Dysphagia, unspecified: Secondary | ICD-10-CM | POA: Diagnosis not present

## 2016-12-12 DIAGNOSIS — C060 Malignant neoplasm of cheek mucosa: Secondary | ICD-10-CM

## 2016-12-12 DIAGNOSIS — I639 Cerebral infarction, unspecified: Secondary | ICD-10-CM | POA: Diagnosis not present

## 2016-12-12 DIAGNOSIS — R238 Other skin changes: Secondary | ICD-10-CM

## 2016-12-12 DIAGNOSIS — R918 Other nonspecific abnormal finding of lung field: Secondary | ICD-10-CM

## 2016-12-12 LAB — CBC WITH DIFFERENTIAL/PLATELET
BASO%: 0.3 % (ref 0.0–2.0)
Basophils Absolute: 0 10*3/uL (ref 0.0–0.1)
EOS ABS: 0.1 10*3/uL (ref 0.0–0.5)
EOS%: 1.7 % (ref 0.0–7.0)
HEMATOCRIT: 30.3 % — AB (ref 38.4–49.9)
HEMOGLOBIN: 10.1 g/dL — AB (ref 13.0–17.1)
LYMPH#: 0.4 10*3/uL — AB (ref 0.9–3.3)
LYMPH%: 8.7 % — AB (ref 14.0–49.0)
MCH: 31.8 pg (ref 27.2–33.4)
MCHC: 33.3 g/dL (ref 32.0–36.0)
MCV: 95.5 fL (ref 79.3–98.0)
MONO#: 0.3 10*3/uL (ref 0.1–0.9)
MONO%: 6.5 % (ref 0.0–14.0)
NEUT%: 82.8 % — ABNORMAL HIGH (ref 39.0–75.0)
NEUTROS ABS: 3.9 10*3/uL (ref 1.5–6.5)
PLATELETS: 217 10*3/uL (ref 140–400)
RBC: 3.18 10*6/uL — AB (ref 4.20–5.82)
RDW: 16 % — AB (ref 11.0–14.6)
WBC: 4.7 10*3/uL (ref 4.0–10.3)

## 2016-12-12 LAB — COMPREHENSIVE METABOLIC PANEL
ALBUMIN: 3.1 g/dL — AB (ref 3.5–5.0)
ALK PHOS: 72 U/L (ref 40–150)
ALT: 18 U/L (ref 0–55)
ANION GAP: 8 meq/L (ref 3–11)
AST: 22 U/L (ref 5–34)
BILIRUBIN TOTAL: 0.62 mg/dL (ref 0.20–1.20)
BUN: 23.7 mg/dL (ref 7.0–26.0)
CO2: 29 meq/L (ref 22–29)
CREATININE: 0.8 mg/dL (ref 0.7–1.3)
Calcium: 9.3 mg/dL (ref 8.4–10.4)
Chloride: 97 mEq/L — ABNORMAL LOW (ref 98–109)
Glucose: 222 mg/dl — ABNORMAL HIGH (ref 70–140)
Potassium: 4.3 mEq/L (ref 3.5–5.1)
Sodium: 134 mEq/L — ABNORMAL LOW (ref 136–145)
TOTAL PROTEIN: 6.7 g/dL (ref 6.4–8.3)

## 2016-12-12 MED ORDER — HEPARIN SOD (PORK) LOCK FLUSH 100 UNIT/ML IV SOLN
500.0000 [IU] | Freq: Once | INTRAVENOUS | Status: AC | PRN
  Administered 2016-12-12: 500 [IU]
  Filled 2016-12-12: qty 5

## 2016-12-12 MED ORDER — PACLITAXEL CHEMO INJECTION 300 MG/50ML
45.0000 mg/m2 | Freq: Once | INTRAVENOUS | Status: AC
  Administered 2016-12-12: 84 mg via INTRAVENOUS
  Filled 2016-12-12: qty 14

## 2016-12-12 MED ORDER — SODIUM CHLORIDE 0.9 % IV SOLN
Freq: Once | INTRAVENOUS | Status: AC
  Administered 2016-12-12: 12:00:00 via INTRAVENOUS

## 2016-12-12 MED ORDER — PALONOSETRON HCL INJECTION 0.25 MG/5ML
0.2500 mg | Freq: Once | INTRAVENOUS | Status: AC
  Administered 2016-12-12: 0.25 mg via INTRAVENOUS

## 2016-12-12 MED ORDER — SODIUM CHLORIDE 0.9 % IV SOLN
176.0000 mg | Freq: Once | INTRAVENOUS | Status: AC
  Administered 2016-12-12: 180 mg via INTRAVENOUS
  Filled 2016-12-12: qty 18

## 2016-12-12 MED ORDER — SODIUM CHLORIDE 0.9 % IV SOLN
20.0000 mg | Freq: Once | INTRAVENOUS | Status: AC
  Administered 2016-12-12: 20 mg via INTRAVENOUS
  Filled 2016-12-12: qty 2

## 2016-12-12 MED ORDER — PALONOSETRON HCL INJECTION 0.25 MG/5ML
INTRAVENOUS | Status: AC
  Filled 2016-12-12: qty 5

## 2016-12-12 MED ORDER — SODIUM CHLORIDE 0.9% FLUSH
10.0000 mL | Freq: Once | INTRAVENOUS | Status: AC
  Administered 2016-12-12: 10 mL
  Filled 2016-12-12: qty 10

## 2016-12-12 MED ORDER — FAMOTIDINE IN NACL 20-0.9 MG/50ML-% IV SOLN
INTRAVENOUS | Status: AC
  Filled 2016-12-12: qty 50

## 2016-12-12 MED ORDER — SODIUM CHLORIDE 0.9% FLUSH
10.0000 mL | INTRAVENOUS | Status: DC | PRN
  Administered 2016-12-12: 10 mL
  Filled 2016-12-12: qty 10

## 2016-12-12 MED ORDER — DIPHENHYDRAMINE HCL 50 MG/ML IJ SOLN
50.0000 mg | Freq: Once | INTRAMUSCULAR | Status: AC
  Administered 2016-12-12: 50 mg via INTRAVENOUS

## 2016-12-12 MED ORDER — DIPHENHYDRAMINE HCL 50 MG/ML IJ SOLN
INTRAMUSCULAR | Status: AC
  Filled 2016-12-12: qty 1

## 2016-12-12 MED ORDER — FAMOTIDINE IN NACL 20-0.9 MG/50ML-% IV SOLN
20.0000 mg | Freq: Once | INTRAVENOUS | Status: AC
  Administered 2016-12-12: 20 mg via INTRAVENOUS

## 2016-12-12 NOTE — Assessment & Plan Note (Signed)
Dysphagia is stable He has appointment to see speech and language therapist today I encouraged him to increase oral intake as tolerated with the goal to wean him off feeding tube .

## 2016-12-12 NOTE — Telephone Encounter (Signed)
yes

## 2016-12-12 NOTE — Patient Instructions (Signed)
   [facility] Discharge Instructions for Patients Receiving Chemotherapy  Today you received the following chemotherapy agents Taxol and Carboplatin   To help prevent nausea and vomiting after your treatment, we encourage you to take your nausea medication as directed.    If you develop nausea and vomiting that is not controlled by your nausea medication, call the clinic.   BELOW ARE SYMPTOMS THAT SHOULD BE REPORTED IMMEDIATELY:  *FEVER GREATER THAN 100.5 F  *CHILLS WITH OR WITHOUT FEVER  NAUSEA AND VOMITING THAT IS NOT CONTROLLED WITH YOUR NAUSEA MEDICATION  *UNUSUAL SHORTNESS OF BREATH  *UNUSUAL BRUISING OR BLEEDING  TENDERNESS IN MOUTH AND THROAT WITH OR WITHOUT PRESENCE OF ULCERS  *URINARY PROBLEMS  *BOWEL PROBLEMS  UNUSUAL RASH Items with * indicate a potential emergency and should be followed up as soon as possible.  Feel free to call the clinic should you have any questions or concerns. The clinic phone number is [phone].  Please show the CHEMO ALERT CARD at check-in to the Emergency Department and triage nurse.   

## 2016-12-12 NOTE — Telephone Encounter (Signed)
Copied from [place]. Topic: Quick Communication - See Telephone Encounter >> Dec 12, 2016 10:25 AM Ether Griffins B wrote: CRM for notification. See Telephone encounter for:  Andres Smith with advanced home care calling needing verbal order for social work for evaluation. Call back number [phone] 12/12/16.

## 2016-12-12 NOTE — Progress Notes (Signed)
[place] OFFICE PROGRESS NOTE  Patient Care Team: Janith Lima, MD as PCP - General (Internal Medicine) Sherren Mocha, MD as Consulting Physician (Cardiology) Jarome Matin, MD as Consulting Physician (Dermatology) Pyrtle, Lajuan Lines, MD as Consulting Physician (Gastroenterology) Katy Apo, MD as Consulting Physician (Ophthalmology) Frederik Schmidt, MD as Consulting Physician (Oral Surgery) [name], MD as Attending Physician (Radiation Oncology) Leota Sauers, RN as Oncology Nurse Navigator Karie Mainland, RD as Dietitian (Nutrition) Dannielle Karvonen, RN as [place] Management  SUMMARY OF ONCOLOGIC HISTORY:   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 Tumor measures 0.9 cm in greatest dimension. Tumor extends to the inked deep tissue edge. Depth of invasion: 4 mm (in this material) See note.  Note: an immunostains for P16 was performed at an outside hospital and provided for [place] to review; p16 immunostain is negative. Per outside report, In situ hybridization for high-risk HPV types showed no evidence of transcriptionally active high-risk HPV types.      03/21/2016 Initial Diagnosis    Salient findings:  -EAC's clear -Lesion within left buccal mucosa (pictured below) with about 89mm depth. Width is about 2/3 of the left buccal mucosal surface area by palpation. Extends to the oral commissure.  -Tongue soft and mobile without mucosal lesions -Neck soft with no palpable neck masses -Right handed -Right hand is pink and warm with Allen's test negative (normal)  -Left forearm very sun damaged with multiple treated areas from precancerous or cancerous areas. Right forearm not as dramatic, presumably from sun exposure while driving      0/93/8182 Surgery    RESECTION OF ORAL BUCCAL MUCOSA SELECTIVE NECK  DISSECTION FREE FLAP RADIAL FOREARM PR SPLIT [place] <100 SQCM [15100] (SPLIT THICKNESS SKIN GRAFT LEG        04/13/2016 Pathology Results    A.LEFT ORAL COMMISSURE INFERIOR, EXCISION: No malignancy identified.  B.LEFT ORAL COMMISSURE SUPERIOR, EXCISION: No malignancy identified.  C.LEFT DEEP ORAL COMMISSURE, EXCISION: No malignancy identified.  D.RIGHT POSTERIOR BUCCAL INFERIOR, EXCISION: No malignancy identified.  E.RIGHT POSTERIOR BUCCAL SUPERIOR, EXCISION: No malignancy identified.  F.ORAL CAVITY, LEFT BUCCAL, EXCISION:  Invasive squamous cell carcinoma, moderately differentiated,  keratinizing. Tumor size:2.2 cm. Tumor depth of invasion:0.8 cm. Perineural invasion identified. Margins negative for malignancy. Pathologic stage:pT2 pN2a. See tumor protocol summary below.  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 Tumor deposit size:1.1 cm. Extranodal extension present. Benign salivary gland tissue.  SURGICAL PATHOLOGY CANCER CASE SUMMARY (AJCC 8TH EDITION) LIP AND ORAL CAVITY CAP Protocol posting date: June, 2017 Version: LipOralCavity 4.0.0.0  PROCEDURE: Wide resection of buccal mucosa TUMOR SITE:Oral:Buccal mucosa TUMOR LATERALITY: Left TUMOR FOCALITY: Unifocal TUMOR SIZE:Greatest dimension: 2.2 cm TUMOR DEPTH OF INVASION (DOI):8 mm HISTOLOGIC TYPE:Squamous cell carcinoma, conventional HISTOLOGIC GRADE:G2: Moderately differentiated SPECIMEN MARGINS:Uninvolved by invasive carcinoma  DISTANCE FROM CLOSEST MARGIN:1 mm  SPECIFY MARGIN(S):Deep LYMPHOVASCULAR INVASION:Present PERINEURAL INVASION: Present REGIONAL LYMPH NODES: NUMBER OF LYMPH NODES INVOLVED: 1 NUMBER OF LYMPH NODES EXAMINED: 4 LATERALITY OF LYMPH NODES INVOLVED:  Ipsilateral SIZE OF LARGEST METASTATIC DEPOSIT:1.1 cm EXTRANODAL EXTENSION:Present PATHOLOGIC STAGE CLASSIFICATION (pTNM, AJCC 8th Ed): pT2 pN2a pT2: umor >2 cm but <=4 cm and <=10 mm DOI pN2a:Metastasis in a single ipsilateral lymph node, 3 cm or  smaller in greatest dimension and ENE(+) BIOMARKERS:performed on prior outside biopsy [facility] review case [phone], outside case [id number]) P16 (Immunohistochemistry):Negative HPV (high-risk  types by in situ hybridization):Reported as negative      05/11/2016 Imaging    1. 4 mm left lower lobe pulmonary nodule. Attention on follow-up imaging recommended as metastatic disease not excluded. 2. 2.8 cm left thyroid nodule. Thyroid ultrasound recommended to further evaluate. 3. Bilateral renal cysts. 4. Coronary artery and thoracoabdominal aortic atherosclerosis. 5. Ankylosing spondylitis.        05/11/2016 Imaging    CT neck: Surgical clips in the region of the left buccal mucosa, presumably at the previous primary site. Mild scarring in that region measuring about 8 mm. Cannot assess for residual or recurrent disease in that location, but this may simply be scarring. Previous left submandibular resection and left neck node dissection. No abnormal nodes presently. Multinodular goiter. Ankylosing spondylitis.  C1-2 remains a mobile segment.      05/21/2016 Procedure    1. Successful placement of a right internal jugular approach power injectable Port-A-Cath. The Port a catheter is ready for immediate use. 2. Successful fluoroscopic insertion of a 20-French pull-through gastrostomy tube. The gastrostomy may be used immediately for medication administration and may be utilized in 24 hrs for the initiation of feeds.      05/23/2016 - 07/09/2016 Radiation Therapy    Site/dose:     Left Cheek and bilateral neck / 60 Gy in 30 fractions to gross disease, 54 Gy in 30 fractions to intermediate risk nodal echelons Beams/energy:    IMRT / 6 MV photons       05/25/2016 - 06/15/2016 Chemotherapy    He received weekly reduced dose cisplatin       06/21/2016 Adverse Reaction    Cycle 5 is delayed due to pancytopenia      06/26/2016 - 06/29/2016 Hospital Admission    He was admitted for management of neutropenic fever      08/27/2016 Imaging    1. Negative for pneumothorax or pleural effusion 2. Acute right eighth rib fracture. Questionable fracture deformity of the right ninth rib.      09/04/2016 Imaging    Left-sided rib fractures, without pneumothorax or pleural fluid      09/27/2016 Imaging    CT chest 1. Multiple (at least 6) scattered solid pulmonary nodules throughout both lungs, all new since 05/11/2016 chest CT and increased since 08/27/2016 chest CT, compatible with growing pulmonary metastases. 2. No thoracic adenopathy. 3. Additional findings include stable multinodular goiter and cholelithiasis.  Aortic Atherosclerosis (ICD10-I70.0).      09/27/2016 Imaging    1. Unchanged appearance of the left buccal mucosa resection site without evidence of disease recurrence or progression. 2. Unchanged cervical ankylosis. 3. Mild carotid and aortic atherosclerosis (ICD10-I70.0).      10/16/2016 Imaging    No evidence of active pulmonary disease.  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       11/26/2016 PET scan    1. Bilateral hypermetabolic enlarging nodules consistent with metastatic pulmonary nodules. 2. 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 Second metastatic lesion in the RIGHT iliac wing. 3. No evidence of local recurrence within the oral cavity      11/28/2016 - 11/30/2016 Hospital Admission    He was admitted for acute stroke      11/29/2016 Imaging    MRI brain: Large acute LEFT PCA territory infarct affecting much of the LEFT occipital lobe. No superimposed hemorrhage  is evident      12/05/2016 -  Chemotherapy  He received weekly chemo with carboplatin and Taxol       INTERVAL HISTORY: Please see below for problem oriented charting. He is seen with his wife for further therapy He denies side effects from chemo such as nausea, vomiting or peripheral neuropathy He denies new neurological deficit He is still dependent on feeding tube daily for nutritional intake He declined eating by mouth due to dysgeusia He denies significant dysphagia He had recent reflux sensation and increased discharge around his feeding tube He was evaluated and was started on antibiotic therapy He denies recent fever or chills  REVIEW OF SYSTEMS:   Constitutional: Denies fevers, chills  Eyes: Denies blurriness of vision Ears, nose, mouth, throat, and face: Denies mucositis or sore throat Respiratory: Denies cough, dyspnea or wheezes Cardiovascular: Denies palpitation, chest discomfort or lower extremity swelling Skin: Denies abnormal skin rashes Lymphatics: Denies new lymphadenopathy or easy bruising Neurological:Denies numbness, tingling or new weaknesses Behavioral/Psych: Mood is stable, no new changes  All other systems were reviewed with the patient and are negative.  I have reviewed the past medical history, past surgical history, social history and family history with the patient and they are unchanged from previous note.  ALLERGIES:  has No Known Allergies.  MEDICATIONS:  Current Outpatient Medications  Medication Sig Dispense Refill  . amoxicillin-clavulanate (AUGMENTIN) 400-57 MG/5ML suspension Take 10 mLs by mouth 2 (two) times daily for 7 days. 150 mL 0  . atorvastatin (LIPITOR) 80 MG tablet TAKE 1 TABLET AT 6PM. 90 tablet 2  . balsalazide (COLAZAL) 750 MG capsule TAKE 3 CAPSULES 3 TIMES A DAY. OPEN CAPS & SPRINKLE ON APPLESAUCE OR DISSOLVE IN WATER & TAKE VIA TUBE. 270 capsule 0  . clopidogrel (PLAVIX) 75 MG tablet Take 1 tablet (75 mg total) by mouth  daily. 30 tablet 1  . cyanocobalamin (,VITAMIN B-12,) 1000 MCG/ML injection INJECT 1ML ONCE MONTHLY. 3 mL 1  . isosorbide mononitrate (ISMO,MONOKET) 20 MG tablet Crush 2 tablets (40 mg total) by mouth 2 (two) times daily at 10 AM and 5 PM. (Patient taking differently: Take 40 mg by mouth 2 (two) times daily. Crush 2 tablets (40 mg total) by mouth 2 (two) times daily at 10 AM and 5 PM.) 120 tablet 11  . mercaptopurine (PURINETHOL) 50 MG tablet TAKE ONE TABLET ONCE A DAY ON AN EMPTY STOMACH 1 HR BEFORE OR 2HRS AFTER A MEAL 30 tablet 2  . metFORMIN (GLUCOPHAGE) 500 MG tablet Place 1 tablet (500 mg total) into feeding tube 2 (two) times daily with a meal. 180 tablet 1  . nitroGLYCERIN (NITROSTAT) 0.4 MG SL tablet Place 1 tablet (0.4 mg total) under the tongue every 5 (five) minutes as needed for chest pain. Max 3 doses. In no response call 911. 25 tablet 5  . Nutritional Supplements (FEEDING SUPPLEMENT, OSMOLITE 1.5 CAL,) LIQD Give 1 and 1/2 cans Osmolite 1.5 via PEG 4 times daily with 60 mL free water before and after bolus feedings. In addition, drink or flush PEG with 240 mL free water TID. Send formula and supplies. 6 Bottle 0  . ondansetron (ZOFRAN) 8 MG tablet Take 1 tablet (8 mg total) by mouth every 8 (eight) hours as needed for nausea. 30 tablet 3  . predniSONE (DELTASONE) 5 MG tablet TAKE 2 TABLETS IN THE MORNING WITH BREAKFAST. (Patient taking differently: TAKE 10MG  IN THE MORNING WITH BREAKFAST.) 60 tablet 0  . promethazine (PHENERGAN) 25 MG tablet Take 1 tablet (25 mg total) by mouth every 6 (six) hours  as needed for nausea. 30 tablet 3  . traMADol (ULTRAM) 50 MG tablet Take 1 tablet (50 mg total) by mouth every 6 (six) hours as needed for moderate pain. 90 tablet 0   No current facility-administered medications for this visit.    Facility-Administered Medications Ordered in Other Visits  Medication Dose Route Frequency Provider Last Rate Last Dose  . 0.9 %  sodium chloride infusion    Intravenous Once Lucette Kratz, MD      . CARBOplatin (PARAPLATIN) 180 mg in sodium chloride 0.9 % 100 mL chemo infusion  180 mg Intravenous Once Alvy Bimler, Taisha Pennebaker, MD      . dexamethasone (DECADRON) 20 mg in sodium chloride 0.9 % 50 mL IVPB  20 mg Intravenous Once Alvy Bimler, Kentrail Shew, MD      . diphenhydrAMINE (BENADRYL) injection 50 mg  50 mg Intravenous Once Alvy Bimler, Severo Beber, MD      . famotidine (PEPCID) IVPB 20 mg premix  20 mg Intravenous Once Alvy Bimler, Malay Fantroy, MD      . heparin lock flush 100 unit/mL  500 Units Intracatheter Once PRN Alvy Bimler, Aniylah Avans, MD      . PACLitaxel (TAXOL) 84 mg in sodium chloride 0.9 % 250 mL chemo infusion (</= 80mg /m2)  45 mg/m2 (Treatment Plan Recorded) Intravenous Once Alvy Bimler, Meosha Castanon, MD      . palonosetron (ALOXI) injection 0.25 mg  0.25 mg Intravenous Once Ryler Laskowski, MD      . sodium chloride flush (NS) 0.9 % injection 10 mL  10 mL Intracatheter PRN Alvy Bimler, Parnika Tweten, MD        PHYSICAL EXAMINATION: ECOG PERFORMANCE STATUS: 1 - Symptomatic but completely ambulatory  Vitals:   12/12/16 0938  BP: (!) 132/56  Pulse: 88  Resp: 18  Temp: 97.7 F (36.5 C)  SpO2: 96%   Filed Weights   12/12/16 0938  Weight: 160 lb 6.4 oz (72.8 kg)    GENERAL:alert, no distress and comfortable SKIN: skin color, texture, turgor are normal, no rashes or significant lesions EYES: normal, Conjunctiva are pink and non-injected, sclera clear OROPHARYNX:no exudate, no erythema and lips, buccal mucosa, and tongue normal  NECK: supple, thyroid normal size, non-tender, without nodularity LYMPH:  no palpable lymphadenopathy in the cervical, axillary or inguinal LUNGS: clear to auscultation and percussion with normal breathing effort HEART: regular rate & rhythm and no murmurs and no lower extremity edema ABDOMEN:abdomen soft, non-tender and normal bowel sounds.  The feeding tube site looks okay without signs of infection Musculoskeletal:no cyanosis of digits and no clubbing  NEURO: alert & oriented x 3 with fluent  speech, no focal motor/sensory deficits  LABORATORY DATA:  I have reviewed the data as listed    Component Value Date/Time   NA 134 (L) 12/12/2016 0901   K 4.3 12/12/2016 0901   CL 99 (L) 11/30/2016 0505   CO2 29 12/12/2016 0901   GLUCOSE 222 (H) 12/12/2016 0901   GLUCOSE 98 12/12/2005 0729   BUN 23.7 12/12/2016 0901   CREATININE 0.8 12/12/2016 0901   CALCIUM 9.3 12/12/2016 0901   PROT 6.7 12/12/2016 0901   ALBUMIN 3.1 (L) 12/12/2016 0901   AST 22 12/12/2016 0901   ALT 18 12/12/2016 0901   ALKPHOS 72 12/12/2016 0901   BILITOT 0.62 12/12/2016 0901   GFRNONAA >60 11/30/2016 0505   GFRAA >60 11/30/2016 0505    No results found for: SPEP, UPEP  Lab Results  Component Value Date   WBC 4.7 12/12/2016   NEUTROABS 3.9 12/12/2016   HGB 10.1 (L)  12/12/2016   HCT 30.3 (L) 12/12/2016   MCV 95.5 12/12/2016   PLT 217 12/12/2016      Chemistry      Component Value Date/Time   NA 134 (L) 12/12/2016 0901   K 4.3 12/12/2016 0901   CL 99 (L) 11/30/2016 0505   CO2 29 12/12/2016 0901   BUN 23.7 12/12/2016 0901   CREATININE 0.8 12/12/2016 0901      Component Value Date/Time   CALCIUM 9.3 12/12/2016 0901   ALKPHOS 72 12/12/2016 0901   AST 22 12/12/2016 0901   ALT 18 12/12/2016 0901   BILITOT 0.62 12/12/2016 0901       RADIOGRAPHIC STUDIES: I have personally reviewed the radiological images as listed and agreed with the findings in the report. Ct Angio Head W Or Wo Contrast  Result Date: 11/28/2016 CLINICAL DATA:  76 year old male acute onset right side hemianopia today. History of prior bilateral cerebellar infarcts. Symptom onset when the patient presented to the cancer center for chemotherapy. EXAM: CT ANGIOGRAPHY HEAD TECHNIQUE: Multidetector CT imaging of the head was performed using the standard protocol during bolus administration of intravenous contrast. Multiplanar CT image reconstructions and MIPs were obtained to evaluate the vascular anatomy. CONTRAST:  25mL  ISOVUE-370 IOPAMIDOL (ISOVUE-370) INJECTION 76% COMPARISON:  Head CT without contrast 1322 hours today. FINDINGS: Posterior circulation: Codominant distal vertebral arteries are patent to the vertebrobasilar junction. There is calcified right V4 segment plaque with mild stenosis. There is noncalcified mild to moderate stenosis of the left V4 segment (series 10, image 26). Right PICA origin an dominant appearing left AICA origin are patent. Patent vertebrobasilar junction. Mild to moderate irregularity and stenosis of the midbasilar artery. The distal basilar is patent. SCA (duplicated, normal variant) and PCA origins appear normal. Posterior communicating arteries are diminutive or absent. The left PCA P1, P2 segments and bifurcation is not patent and normal. The left P3 branches appear within normal limits. Contralateral right PCA branches appear normal. Anterior circulation: Distal cervical ICAs are patent. Tortuous bilateral ICA siphons are patent with mild to moderate left and moderate to severe right side calcified atherosclerosis. There is moderate to severe right ICA supraclinoid segment stenosis. No significant stenosis on the left. Both ophthalmic artery origins are patent. Bilateral carotid termini are patent. MCA and ACA origins are normal. Anterior communicating artery and bilateral ACA branches are within normal limits. Left MCA M1 segment, left MCA trifurcation and left MCA branches are within normal limits. Right MCA M1 segment, bifurcation, and right MCA branches are within normal limits. Venous sinuses: Patent. Anatomic variants: None. Delayed phase: Stable gray-white matter differentiation throughout the brain. No cortically based acute infarct identified. No acute intracranial hemorrhage identified. No midline shift, mass effect, or evidence of intracranial mass lesion. No abnormal enhancement identified. There is probably a small area of chronic encephalomalacia in the posterior right MCA  territory, right peri-Rolandic cortex on series 16, image 22 which appears stable since 06/26/2016. Review of the MIP images confirms the above findings IMPRESSION: 1. Negative for emergent large intracranial vessel occlusion. Dr. Lovenia Kim gave a preliminary report of this finding to Dr. Jerelyn Charles. 2. Posterior circulation atherosclerosis including multifocal mild to moderate bilateral distal vertebral and basilar artery stenosis. The left PCA appears normal. 3. Stable CT appearance of the brain from earlier today. No acute or evolving infarct identified. 4. Bulky right ICA siphon calcified atherosclerosis with moderate or severe supraclinoid ICA stenosis. 5. Elsewhere the anterior circulation is within normal limits. Electronically Signed  By: Genevie Ann M.D.   On: 11/28/2016 15:09   Mr Brain Wo Contrast  Result Date: 11/29/2016 CLINICAL DATA:  RIGHT hemianopsia.  Acute onset. EXAM: MRI HEAD WITHOUT CONTRAST TECHNIQUE: Multiplanar, multiecho pulse sequences of the brain and surrounding structures were obtained without intravenous contrast. COMPARISON:  CT head 11/28/2016.  CTA head 11/28/2016. FINDINGS: Brain: Large area of restricted diffusion involves much of the LEFT occipital lobe, including the calcarine cortex, corresponding low ADC, consistent with acute infarction. No mass lesion, hemorrhage, hydrocephalus, or extra-axial fluid. Generalized atrophy. Chronic microvascular ischemic change. Scattered areas of chronic lacunar infarction affect the cerebral and cerebellar hemispheres. Vascular: Flow voids are maintained, specifically in the BILATERAL vertebral and basilar arteries. LEFT PCA appears patent as well. Skull and upper cervical spine: Post treatment marrow changes of the skull base and upper cervical region. Sinuses/Orbits: No layering fluid or significant opacity. Normal orbits. Other: Unremarkable. IMPRESSION: Large acute LEFT PCA territory infarct affecting much of the LEFT occipital lobe. No  superimposed hemorrhage is evident. Electronically Signed   By: Staci Righter M.D.   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 Patient status post chemo radiation therapy. EXAM: NUCLEAR MEDICINE PET SKULL BASE TO THIGH TECHNIQUE: 7.6 mCi F-18 FDG was injected intravenously. Full-ring PET imaging was performed from the skull base to thigh after the radiotracer. CT data was obtained and used for attenuation correction and anatomic localization. FASTING BLOOD GLUCOSE:  Value: 264 mg/dl COMPARISON:  CT chest 09/27/2016 FINDINGS: NECK No residual hypermetabolic activity in the oral cavity. No hypermetabolic lymph nodes in the neck. Postsurgical change in the LEFT submandibular neck. CHEST There are several solid pulmonary nodules increased in size from comparison CT and with associated metabolic activity. For example: RIGHT upper lobe nodule measuring 13 mm with SUV max 4.5. 16  mm RIGHT lower lobe nodule with SUV max equal 6.0 19 mm nodule in the medial LEFT lower lobe with SUV max 5.5. These 3 nodules are on same image slice (image 37, series 2). ABDOMEN/PELVIS Percutaneous gastrostomy tube in the stomach. Gallstones noted. No hypermetabolic lesion in the liver and. Normal adrenal glands. No hypermetabolic abdominopelvic nodes.  Prostate gland SKELETON Expansile metastatic lesion within the anteromedial aspect of the right eighth rib with soft tissue component measuring 3.5 by 4.0 cm and with intense peripheral metabolic activity (SUV max 7.4). Second skeletal metastasis within the RIGHT iliac wing associated with lesion lytic lesion and SUV max equal 6.6. IMPRESSION: 1. Bilateral hypermetabolic enlarging nodules consistent with metastatic pulmonary nodules. 2. Hypermetabolic skeletal metastasis involving an expansile lesion in the anteromedial  RIGHT eighth rib. Second metastatic lesion in the RIGHT iliac wing. 3. No evidence of local recurrence within the oral cavity Electronically Signed   By: Suzy Bouchard M.D.   On: 11/26/2016 16:05   Ct Head Code Stroke Wo Contrast  Result Date: 11/28/2016 CLINICAL DATA:  Code stroke. 76 year old male scheduled for chemotherapy today, acute onset loss of right side vision in both eyes. A fib. EXAM: CT HEAD WITHOUT CONTRAST TECHNIQUE: Contiguous axial images were obtained from the base of the skull through the vertex without intravenous contrast. COMPARISON:  Neck CT 09/27/2016.  Head CT 06/26/2016 and earlier. FINDINGS: Brain: Stable cerebral volume. No acute intracranial hemorrhage identified. No midline shift, mass effect, or evidence of intracranial mass lesion. No ventriculomegaly. Multiple  chronic lacunar infarcts in both cerebellar hemispheres appear stable. Comparatively mild patchy bilateral white matter hypodensity appears stable. No cortically based acute infarct identified. Vascular: Calcified atherosclerosis at the skull base. No suspicious intracranial vascular hyperdensity. Skull: Degenerative changes in the upper cervical spine. No acute osseous abnormality identified. Sinuses/Orbits: Clear. Other: Orbits soft tissues appear stable and normal. Visualized scalp soft tissues are within normal limits. ASPECTS [facility] Stroke Program Early CT Score) - Ganglionic level infarction (caudate, lentiform nuclei, internal capsule, insula, M1-M3 cortex): 7 - Supraganglionic infarction (M4-M6 cortex): 3 Total score (0-10 with 10 being normal): 10 IMPRESSION: 1. No acute cortically based infarct or acute intracranial hemorrhage identified. 2. ASPECTS is 10. 3. 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 4. Study discussed by telephone with Dr. Carmin Muskrat on 11/28/2016 at 13:39 . Electronically Signed   By: Genevie Ann  M.D.   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HCC) He tolerated cycle 1 of chemotherapy well We will proceed with cycle 2 without dose adjustment  Acute cardioembolic stroke [facility]) He denies new neurological deficit He will continue PT/OT  Dysphagia Dysphagia is stable He has appointment to see speech and language therapist today I encouraged him to increase oral intake as tolerated with the goal to wean him off feeding tube .  Skin irritation He was being evaluated for possible infection causing discharge around the feeding tube Clinically, the area does not appear infected Recommend discontinuation of oral antibiotic therapy I recommend close observation only at this point   No orders of the defined types were placed in this encounter.  All questions were answered. The patient knows to call the clinic with any problems, questions or concerns. No barriers to learning was detected. I spent 15 minutes counseling the patient face to face. The total time spent in the appointment was 20 minutes and more than 50% was on counseling and review of test results     [name], MD 12/12/2016 12:16 PM

## 2016-12-12 NOTE — Telephone Encounter (Signed)
Notified Lucinda w/MD response...Johny Chess

## 2016-12-12 NOTE — Telephone Encounter (Signed)
Copied from [place] [phone] [phone]. Topic: Quick Communication - See Telephone Encounter >> Dec 12, 2016  8:26 AM Robina Ade, Helene Kelp D wrote: CRM for notification. See Telephone encounter for: 12/12/16. Herbert Deaner from [facility] called asking for order for PT for patient to start next week. He would like 2X for 2 weeks to start with. Clair Gulling can be reached at [phone].

## 2016-12-12 NOTE — Patient Instructions (Signed)
Implanted Port Home Guide An implanted port is a type of central line that is placed under the skin. Central lines are used to provide IV access when treatment or nutrition needs to be given through a person's veins. Implanted ports are used for long-term IV access. An implanted port may be placed because:  You need IV medicine that would be irritating to the small veins in your hands or arms.  You need long-term IV medicines, such as antibiotics.  You need IV nutrition for a long period.  You need frequent blood draws for lab tests.  You need dialysis.  Implanted ports are usually placed in the chest area, but they can also be placed in the upper arm, the abdomen, or the leg. An implanted port has two main parts:  Reservoir. The reservoir is round and will appear as a small, raised area under your skin. The reservoir is the part where a needle is inserted to give medicines or draw blood.  Catheter. The catheter is a thin, flexible tube that extends from the reservoir. The catheter is placed into a large vein. Medicine that is inserted into the reservoir goes into the catheter and then into the vein.  How will I care for my incision site? Do not get the incision site wet. Bathe or shower as directed by your health care provider. How is my port accessed? Special steps must be taken to access the port:  Before the port is accessed, a numbing cream can be placed on the skin. This helps numb the skin over the port site.  Your health care provider uses a sterile technique to access the port. ? Your health care provider must put on a mask and sterile gloves. ? The skin over your port is cleaned carefully with an antiseptic and allowed to dry. ? The port is gently pinched between sterile gloves, and a needle is inserted into the port.  Only "non-coring" port needles should be used to access the port. Once the port is accessed, a blood return should be checked. This helps ensure that the port  is in the vein and is not clogged.  If your port needs to remain accessed for a constant infusion, a clear (transparent) bandage will be placed over the needle site. The bandage and needle will need to be changed every week, or as directed by your health care provider.  Keep the bandage covering the needle clean and dry. Do not get it wet. Follow your health care provider's instructions on how to take a shower or bath while the port is accessed.  If your port does not need to stay accessed, no bandage is needed over the port.  What is flushing? Flushing helps keep the port from getting clogged. Follow your health care provider's instructions on how and when to flush the port. Ports are usually flushed with saline solution or a medicine called heparin. The need for flushing will depend on how the port is used.  If the port is used for intermittent medicines or blood draws, the port will need to be flushed: ? After medicines have been given. ? After blood has been drawn. ? As part of routine maintenance.  If a constant infusion is running, the port may not need to be flushed.  How long will my port stay implanted? The port can stay in for as long as your health care provider thinks it is needed. When it is time for the port to come out, surgery will be   done to remove it. The procedure is similar to the one performed when the port was put in. When should I seek immediate medical care? When you have an implanted port, you should seek immediate medical care if:  You notice a bad smell coming from the incision site.  You have swelling, redness, or drainage at the incision site.  You have more swelling or pain at the port site or the surrounding area.  You have a fever that is not controlled with medicine.  This information is not intended to replace advice given to you by your health care provider. Make sure you discuss any questions you have with your health care provider. Document  Released: 12/18/2004 Document Revised: 05/26/2015 Document Reviewed: 08/25/2012 Elsevier Interactive Patient Education  2017 Elsevier Inc. Influenza Virus Vaccine (Flucelvax) What is this medicine? INFLUENZA VIRUS VACCINE (in floo EN zuh VAHY ruhs vak SEEN) helps to reduce the risk of getting influenza also known as the flu. The vaccine only helps protect you against some strains of the flu. This medicine may be used for other purposes; ask your health care provider or pharmacist if you have questions. COMMON BRAND NAME(S): FLUCELVAX What should I tell my health care provider before I take this medicine? They need to know if you have any of these conditions: -bleeding disorder like hemophilia -fever or infection -Guillain-Barre syndrome or other neurological problems -immune system problems -infection with the human immunodeficiency virus (HIV) or AIDS -low blood platelet counts -multiple sclerosis -an unusual or allergic reaction to influenza virus vaccine, other medicines, foods, dyes or preservatives -pregnant or trying to get pregnant -breast-feeding How should I use this medicine? This vaccine is for injection into a muscle. It is given by a health care professional. A copy of Vaccine Information Statements will be given before each vaccination. Read this sheet carefully each time. The sheet may change frequently. Talk to your pediatrician regarding the use of this medicine in children. Special care may be needed. Overdosage: If you think you've taken too much of this medicine contact a poison control center or emergency room at once. Overdosage: If you think you have taken too much of this medicine contact a poison control center or emergency room at once. NOTE: This medicine is only for you. Do not share this medicine with others. What if I miss a dose? This does not apply. What may interact with this medicine? -chemotherapy or radiation therapy -medicines that lower your immune  system like etanercept, anakinra, infliximab, and adalimumab -medicines that treat or prevent blood clots like warfarin -phenytoin -steroid medicines like prednisone or cortisone -theophylline -vaccines This list may not describe all possible interactions. Give your health care provider a list of all the medicines, herbs, non-prescription drugs, or dietary supplements you use. Also tell them if you smoke, drink alcohol, or use illegal drugs. Some items may interact with your medicine. What should I watch for while using this medicine? Report any side effects that do not go away within 3 days to your doctor or health care professional. Call your health care provider if any unusual symptoms occur within 6 weeks of receiving this vaccine. You may still catch the flu, but the illness is not usually as bad. You cannot get the flu from the vaccine. The vaccine will not protect against colds or other illnesses that may cause fever. The vaccine is needed every year. What side effects may I notice from receiving this medicine? Side effects that you should report to your doctor   or health care professional as soon as possible: -allergic reactions like skin rash, itching or hives, swelling of the face, lips, or tongue Side effects that usually do not require medical attention (Report these to your doctor or health care professional if they continue or are bothersome.): -fever -headache -muscle aches and pains -pain, tenderness, redness, or swelling at the injection site -tiredness This list may not describe all possible side effects. Call your doctor for medical advice about side effects. You may report side effects to FDA at 1-800-FDA-1088. Where should I keep my medicine? The vaccine will be given by a health care professional in a clinic, pharmacy, doctor's office, or other health care setting. You will not be given vaccine doses to store at home. NOTE: This sheet is a summary. It may not cover all  possible information. If you have questions about this medicine, talk to your doctor, pharmacist, or health care provider.  2018 Elsevier/Gold Standard (2010-11-29 14:06:47)  

## 2016-12-12 NOTE — Progress Notes (Signed)
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 Patient continues to use his feeding tube for nutrition support.  He is tolerating 6 bottles of Osmolite 1.5 daily providing 2130 cal, 89.4 g protein. Weight is stable and documented as 160.1 pounds. Labs reviewed: Sodium 135, glucose 207, albumin 3.4. Patient reports he drinks water and coffee by mouth. He is trying to eat bites of food throughout the day. Patient has no desire to eat which is the reason that he cannot eat more food more often.  Estimated nutrition needs: 2340-2540 calories, 106-100 grams protein, 2.5 L fluid.  Nutrition diagnosis: Unintended weight loss improved. Severe malnutrition, ongoing.  Intervention: Educated patient to continue 6 bottles Osmolite 1.5 daily via PEG. Explained importance of patient increasing oral intake between meals. Recommended 2 tablespoons of food every 2 hours. Suggested patient should try 2 ounces of milk shake every hour as tolerated. Patient agreeable at this time. Questions answered.  Teach back method used.  Monitoring, evaluation, goals: Patient will work to increase oral intake so tube feeding can be weaned/discontinued.  Next visit: Friday, January 4 during infusion.  **Disclaimer: This note was dictated with voice recognition software. Similar sounding words can inadvertently be transcribed and this note may contain transcription errors which may not have been corrected upon publication of note.**

## 2016-12-12 NOTE — Assessment & Plan Note (Signed)
He denies new neurological deficit He will continue PT/OT

## 2016-12-12 NOTE — Assessment & Plan Note (Signed)
He was being evaluated for possible infection causing discharge around the feeding tube Clinically, the area does not appear infected Recommend discontinuation of oral antibiotic therapy I recommend close observation only at this point

## 2016-12-12 NOTE — Telephone Encounter (Signed)
Gave avs and calendar for January 2019 °

## 2016-12-12 NOTE — Telephone Encounter (Signed)
Called and spoke to [place] with [facility]. Gave verbal okay for PT as requested.

## 2016-12-12 NOTE — Assessment & Plan Note (Signed)
He tolerated cycle 1 of chemotherapy well We will proceed with cycle 2 without dose adjustment

## 2016-12-12 NOTE — Telephone Encounter (Signed)
Copied from [place] [id number]. Topic: Quick Communication - See Telephone Encounter >> Dec 12, 2016  4:16 PM Ether Griffins B wrote: CRM for notification. See Telephone encounter for:  [facility] with [facility] wanting verbal order for skilled nursing to assist with stroke, peg tube and med management. 1 x for 1 week (saw on 12/8) and 2x for 2 weeks call back number [phone] 12/12/16.

## 2016-12-13 NOTE — Telephone Encounter (Signed)
Called Andres Smith back no answer LMOM w/MD response...Andres Smith

## 2016-12-13 NOTE — Telephone Encounter (Signed)
yes

## 2016-12-14 ENCOUNTER — Other Ambulatory Visit: Payer: Self-pay

## 2016-12-14 ENCOUNTER — Ambulatory Visit: Payer: PPO | Admitting: Internal Medicine

## 2016-12-14 NOTE — Patient Outreach (Signed)
[facility]) Care Management  12/14/2016  ABDULHADI STOPA [birth year] [id number]   No response from patient after 3 telephone calls and outreach letter attempt.   PLAN; RNCM will refer patient to care management assistant to close due to being unable to reach.  Quinn Plowman RN,BSN,CCM [facility] Telephonic  [phone]

## 2016-12-17 ENCOUNTER — Other Ambulatory Visit (INDEPENDENT_AMBULATORY_CARE_PROVIDER_SITE_OTHER): Payer: PPO

## 2016-12-17 ENCOUNTER — Ambulatory Visit: Payer: PPO | Admitting: Internal Medicine

## 2016-12-17 ENCOUNTER — Encounter: Payer: Self-pay | Admitting: General Practice

## 2016-12-17 ENCOUNTER — Other Ambulatory Visit: Payer: Self-pay | Admitting: *Deleted

## 2016-12-17 ENCOUNTER — Encounter: Payer: Self-pay | Admitting: Internal Medicine

## 2016-12-17 ENCOUNTER — Telehealth: Payer: Self-pay | Admitting: Internal Medicine

## 2016-12-17 VITALS — BP 110/60 | HR 90 | Temp 97.5°F | Resp 16 | Ht 68.0 in | Wt 162.0 lb

## 2016-12-17 DIAGNOSIS — E118 Type 2 diabetes mellitus with unspecified complications: Secondary | ICD-10-CM

## 2016-12-17 DIAGNOSIS — D539 Nutritional anemia, unspecified: Secondary | ICD-10-CM

## 2016-12-17 DIAGNOSIS — R64 Cachexia: Secondary | ICD-10-CM | POA: Diagnosis not present

## 2016-12-17 DIAGNOSIS — I6300 Cerebral infarction due to thrombosis of unspecified precerebral artery: Secondary | ICD-10-CM | POA: Diagnosis not present

## 2016-12-17 DIAGNOSIS — D510 Vitamin B12 deficiency anemia due to intrinsic factor deficiency: Secondary | ICD-10-CM

## 2016-12-17 LAB — MICROALBUMIN / CREATININE URINE RATIO
CREATININE, U: 115.7 mg/dL
MICROALB/CREAT RATIO: 5.8 mg/g (ref 0.0–30.0)
Microalb, Ur: 6.8 mg/dL — ABNORMAL HIGH (ref 0.0–1.9)

## 2016-12-17 LAB — FOLATE

## 2016-12-17 LAB — IBC PANEL
IRON: 61 ug/dL (ref 42–165)
SATURATION RATIOS: 22.8 % (ref 20.0–50.0)
Transferrin: 191 mg/dL — ABNORMAL LOW (ref 212.0–360.0)

## 2016-12-17 LAB — FERRITIN: Ferritin: 464.1 ng/mL — ABNORMAL HIGH (ref 22.0–322.0)

## 2016-12-17 MED ORDER — DRONABINOL 2.5 MG PO CAPS: 2.5000 mg | ORAL_CAPSULE | Freq: Two times a day (BID) | ORAL | 3 refills | Status: DC

## 2016-12-17 MED ORDER — CYANOCOBALAMIN 1000 MCG/ML IJ SOLN
1000.0000 ug | Freq: Once | INTRAMUSCULAR | Status: AC
  Administered 2016-12-17: 1000 ug via INTRAMUSCULAR

## 2016-12-17 NOTE — Patient Instructions (Signed)

## 2016-12-17 NOTE — Telephone Encounter (Signed)
OK with me.

## 2016-12-17 NOTE — Progress Notes (Signed)
Subjective:  Patient ID: Andres Smith, male    DOB: [birth year]  Age: 76 y.o. MRN: [id number]  CC: Anemia and Diabetes   HPI GALILEO COLELLO presents for f/up -he recently suffered a cerebellar stroke.  He is being monitored for possible atrial fibrillation.  The notes indicate that if A. fib is discovered he will be placed on anticoagulant but for now he is solely on an antiplatelet agent.  He complains of extremely poor appetite over the last few weeks.  He tells me his oncologist has told him that is very important for him to eat food.  He complains of fatigue and weight loss.  He has not had a B12 injection in 2 months.  Outpatient Medications Prior to Visit  Medication Sig Dispense Refill  . atorvastatin (LIPITOR) 80 MG tablet TAKE 1 TABLET AT 6PM. 90 tablet 2  . balsalazide (COLAZAL) 750 MG capsule TAKE 3 CAPSULES 3 TIMES A DAY. OPEN CAPS & SPRINKLE ON APPLESAUCE OR DISSOLVE IN WATER & TAKE VIA TUBE. 270 capsule 0  . clopidogrel (PLAVIX) 75 MG tablet Take 1 tablet (75 mg total) by mouth daily. 30 tablet 1  . cyanocobalamin (,VITAMIN B-12,) 1000 MCG/ML injection INJECT 1ML ONCE MONTHLY. 3 mL 1  . isosorbide mononitrate (ISMO,MONOKET) 20 MG tablet Crush 2 tablets (40 mg total) by mouth 2 (two) times daily at 10 AM and 5 PM. (Patient taking differently: Take 40 mg by mouth 2 (two) times daily. Crush 2 tablets (40 mg total) by mouth 2 (two) times daily at 10 AM and 5 PM.) 120 tablet 11  . mercaptopurine (PURINETHOL) 50 MG tablet TAKE ONE TABLET ONCE A DAY ON AN EMPTY STOMACH 1 HR BEFORE OR 2HRS AFTER A MEAL 30 tablet 2  . metFORMIN (GLUCOPHAGE) 500 MG tablet Place 1 tablet (500 mg total) into feeding tube 2 (two) times daily with a meal. 180 tablet 1  . nitroGLYCERIN (NITROSTAT) 0.4 MG SL tablet Place 1 tablet (0.4 mg total) under the tongue every 5 (five) minutes as needed for chest pain. Max 3 doses. In no response call 911. 25 tablet 5  . Nutritional Supplements (FEEDING SUPPLEMENT, OSMOLITE 1.5  CAL,) LIQD Give 1 and 1/2 cans Osmolite 1.5 via PEG 4 times daily with 60 mL free water before and after bolus feedings. In addition, drink or flush PEG with 240 mL free water TID. Send formula and supplies. 6 Bottle 0  . ondansetron (ZOFRAN) 8 MG tablet Take 1 tablet (8 mg total) by mouth every 8 (eight) hours as needed for nausea. 30 tablet 3  . predniSONE (DELTASONE) 5 MG tablet TAKE 2 TABLETS IN THE MORNING WITH BREAKFAST. (Patient taking differently: TAKE 10MG  IN THE MORNING WITH BREAKFAST.) 60 tablet 0  . promethazine (PHENERGAN) 25 MG tablet Take 1 tablet (25 mg total) by mouth every 6 (six) hours as needed for nausea. 30 tablet 3  . traMADol (ULTRAM) 50 MG tablet Take 1 tablet (50 mg total) by mouth every 6 (six) hours as needed for moderate pain. 90 tablet 0   No facility-administered medications prior to visit.     ROS Review of Systems  Constitutional: Positive for appetite change, fatigue and unexpected weight change. Negative for chills, diaphoresis and fever.  HENT: Positive for trouble swallowing. Negative for sore throat.   Eyes: Positive for visual disturbance.       He has a right visual field cut  Respiratory: Negative for cough, chest tightness, shortness of breath and wheezing.  Cardiovascular: Negative for chest pain, palpitations and leg swelling.  Gastrointestinal: Negative for abdominal pain, constipation, diarrhea, nausea and vomiting.  Endocrine: Negative.  Negative for polydipsia, polyphagia and polyuria.  Genitourinary: Negative.  Negative for difficulty urinating and urgency.  Musculoskeletal: Negative.   Skin: Negative.  Negative for color change.  Allergic/Immunologic: Negative.   Neurological: Negative.  Negative for dizziness, weakness and headaches.  Hematological: Negative for adenopathy. Does not bruise/bleed easily.  Psychiatric/Behavioral: Negative.     Objective:  BP 110/60 (BP Location: Left Arm, Patient Position: Sitting, Cuff Size: Normal)    Pulse 90   Temp (!) 97.5 F (36.4 C) (Oral)   Resp 16   Ht 5\' 8"  (1.727 m)   Wt 162 lb (73.5 kg)   SpO2 97%   BMI 24.63 kg/m   BP Readings from Last 3 Encounters:  12/17/16 110/60  12/12/16 (!) 132/56  12/07/16 122/71    Wt Readings from Last 3 Encounters:  12/17/16 162 lb (73.5 kg)  12/12/16 160 lb 6.4 oz (72.8 kg)  12/07/16 162 lb 14.4 oz (73.9 kg)    Physical Exam  Constitutional: He is oriented to person, place, and time. No distress.  HENT:  Mouth/Throat: Oropharynx is clear and moist. No oropharyngeal exudate.  Eyes: Conjunctivae are normal. Left eye exhibits no discharge. No scleral icterus.  Neck: Normal range of motion. Neck supple. No JVD present. No thyromegaly present.  Cardiovascular: Normal rate, regular rhythm and normal heart sounds.  No murmur heard. Pulmonary/Chest: Effort normal and breath sounds normal. He has no wheezes. He has no rales.  Abdominal: Soft. Bowel sounds are normal. He exhibits no mass. There is no tenderness. There is no guarding.  Musculoskeletal: Normal range of motion. He exhibits no edema or tenderness.  Lymphadenopathy:    He has no cervical adenopathy.  Neurological: He is alert and oriented to person, place, and time.  Skin: Skin is warm and dry. No rash noted. He is not diaphoretic. No erythema. No pallor.  Vitals reviewed.   Lab Results  Component Value Date   WBC 4.7 12/12/2016   HGB 10.1 (L) 12/12/2016   HCT 30.3 (L) 12/12/2016   PLT 217 12/12/2016   GLUCOSE 222 (H) 12/12/2016   CHOL 104 11/29/2016   TRIG 80 11/29/2016   HDL 41 11/29/2016   LDLCALC 47 11/29/2016   ALT 18 12/12/2016   AST 22 12/12/2016   NA 134 (L) 12/12/2016   K 4.3 12/12/2016   CL 99 (L) 11/30/2016   CREATININE 0.8 12/12/2016   BUN 23.7 12/12/2016   CO2 29 12/12/2016   TSH 1.971 10/10/2016   PSA 2.29 05/06/2012   INR 0.97 11/28/2016   HGBA1C 7.4 (H) 11/29/2016   MICROALBUR 6.8 (H) 12/17/2016    No results found.  Assessment & Plan:    Clell was seen today for anemia and diabetes.  Diagnoses and all orders for this visit:  Type 2 diabetes mellitus with complication, without long-term current use of insulin ([place])- His recent A1c was 7.4%.  His blood sugars are adequately well controlled. -     Microalbumin / creatinine urine ratio; Future  Deficiency anemia- Will restart B12 replacement therapy and will screen for iron and folate deficiencies. -     IBC panel; Future -     Folate; Future -     Ferritin; Future  Vitamin B12 deficiency anemia due to intrinsic factor deficiency -     cyanocobalamin ((VITAMIN B-12)) injection 1,000 mcg  Cachexia ([place])- Will  start dronabinol at a low dose.  If he has good good response to it and wants to increase the  dose then I will do so over the next few weeks and months. -     dronabinol (MARINOL) 2.5 MG capsule; Take 1 capsule (2.5 mg total) by mouth 2 (two) times daily before a meal.   I am having Barnabas Lister L. Banghart start on dronabinol. I am also having him maintain his cyanocobalamin, feeding supplement (OSMOLITE 1.5 CAL), isosorbide mononitrate, nitroGLYCERIN, metFORMIN, mercaptopurine, traMADol, predniSONE, balsalazide, clopidogrel, atorvastatin, ondansetron, and promethazine. We administered cyanocobalamin.  Meds ordered this encounter  Medications  . dronabinol (MARINOL) 2.5 MG capsule    Sig: Take 1 capsule (2.5 mg total) by mouth 2 (two) times daily before a meal.    Dispense:  60 capsule    Refill:  3  . cyanocobalamin ((VITAMIN B-12)) injection 1,000 mcg     Follow-up: Return in about 6 months (around 06/17/2017).  [name], MD

## 2016-12-17 NOTE — Progress Notes (Signed)
[place] Spiritual Care Note  Referred by Raford Pitcher Neff/RD for emotional support Mr Jarchow and especially his wife/caregiver [name]. LVM of introduction with encouragement to return call.   [place], [place], [facility] Pager [phone] Voicemail [phone]

## 2016-12-17 NOTE — Telephone Encounter (Signed)
Copied from [place] [id number]. Topic: General - Other >> Dec 17, 2016  1:51 PM Synthia Innocent wrote: Reason for CRM: Needing verbal for OT for 1 time a week for 4 weeks

## 2016-12-17 NOTE — Telephone Encounter (Signed)
Called Andres Smith no msg w/MD response...Andres Smith

## 2016-12-17 NOTE — Patient Outreach (Signed)
Called [facility] High Risk pt. Unable to talk with pt but I was able to leave a message and requested a return call. I will call again another day.  Eulah Pont. Myrtie Neither, MSN, [facility] Gerontological Nurse Practitioner [facility] Care Management [phone]

## 2016-12-18 ENCOUNTER — Encounter: Payer: Self-pay | Admitting: Internal Medicine

## 2016-12-18 ENCOUNTER — Telehealth: Payer: Self-pay | Admitting: Internal Medicine

## 2016-12-18 ENCOUNTER — Ambulatory Visit (INDEPENDENT_AMBULATORY_CARE_PROVIDER_SITE_OTHER): Payer: PPO | Admitting: Internal Medicine

## 2016-12-18 VITALS — BP 102/68 | HR 64 | Ht 68.0 in | Wt 166.4 lb

## 2016-12-18 DIAGNOSIS — C799 Secondary malignant neoplasm of unspecified site: Secondary | ICD-10-CM

## 2016-12-18 DIAGNOSIS — E118 Type 2 diabetes mellitus with unspecified complications: Secondary | ICD-10-CM

## 2016-12-18 DIAGNOSIS — IMO0002 Reserved for concepts with insufficient information to code with codable children: Secondary | ICD-10-CM

## 2016-12-18 DIAGNOSIS — K5100 Ulcerative (chronic) pancolitis without complications: Secondary | ICD-10-CM | POA: Diagnosis not present

## 2016-12-18 NOTE — Telephone Encounter (Signed)
Copied from [place]. Topic: General - Other >> Dec 18, 2016  4:54 PM Valla Leaver wrote: Reason for CRM: [place], PT, requesting a right AFO for a right side foot drop prescription that needs to be sent to [facility]. [employer] P: [phone] :

## 2016-12-18 NOTE — Progress Notes (Signed)
Subjective:    Patient ID: Andres Smith, male    DOB: 03/19/1940, 76 y.o.   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  He is here today with his wife.  He is undergoing chemotherapy with his next chemotherapy infusion scheduled for tomorrow under the direction of Dr. Alvy Bimler.  He is also on a 30-day Holter monitor with concern for A. Fib.  He has had ongoing issues with poor appetite.  He states that he is swallowing better but for the most part slowly using his feeding tube for nutrition.  He does eat very soft solids like eggs and is able to drink liquids.  He states any food with much consistency particularly "gummy" foods or hard for him to swallow.  He also has mucus that he needs to clear from his mouth on a regular basis.  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  He has continued balsalazide through his tube 3 times a day.  He is on 6-MP 50 mg daily.  His bowel movements are mostly liquid which he associates with his tube feeds.  He denies abdominal pain and blood in his stool.  He does continue to have intermittent issues with rib pain where he has a focus of metastatic disease.   Review of Systems As per HPI, otherwise negative  Current Medications, Allergies, Past Medical History, Past Surgical History, Family History and Social History were reviewed in [employer] record.     Objective:   Physical Exam BP 102/68   Pulse 64   Ht 5\' 8"  (1.727 m)   Wt 166 lb 7 oz (75.5 kg)   BMI 25.31 kg/m  Constitutional: Well-developed and chronically ill-appearing male. No distress. HEENT: Normocephalic and atraumatic. Oropharynx is dry. Conjunctivae  are well.  No scleral icterus. Neck: Neck supple. Trachea midline. Cardiovascular: Normal rate, regular rhythm and intact distal pulses.  Pulmonary/chest: Effort normal and breath sounds normal. No wheezing, rales or rhonchi. Abdominal: Soft, nontender, nondistended. Bowel sounds active throughout.  PEG tube in place in the left upper quadrant Extremities: no clubbing, cyanosis Neurological: Alert and oriented to person place and time. Skin: Skin is warm and dry. Psychiatric: Normal mood and affect. Behavior is normal.  CBC    Component Value Date/Time   WBC 4.7 12/12/2016 0901   WBC 7.0 11/30/2016 0505   RBC 3.18 (L) 12/12/2016 0901   RBC 3.49 (L) 11/30/2016 0505   HGB 10.1 (L) 12/12/2016 0901   HCT 30.3 (L) 12/12/2016 0901   PLT 217 12/12/2016 0901   MCV 95.5 12/12/2016 0901   MCH 31.8 12/12/2016 0901   MCH 31.5 11/30/2016 0505   MCHC 33.3 12/12/2016 0901   MCHC 34.0 11/30/2016 0505   RDW 16.0 (H) 12/12/2016 0901   LYMPHSABS 0.4 (L) 12/12/2016 0901   MONOABS 0.3 12/12/2016 0901   EOSABS 0.1 12/12/2016 0901   BASOSABS 0.0 12/12/2016 0901   CMP     Component Value Date/Time   NA 134 (L) 12/12/2016 0901   K 4.3 12/12/2016 0901   CL 99 (L) 11/30/2016 0505   CO2 29 12/12/2016 0901   GLUCOSE 222 (H) 12/12/2016 0901   GLUCOSE 98 12/12/2005 0729   BUN 23.7 12/12/2016 0901  CREATININE 0.8 12/12/2016 0901   CALCIUM 9.3 12/12/2016 0901   PROT 6.7 12/12/2016 0901   ALBUMIN 3.1 (L) 12/12/2016 0901   AST 22 12/12/2016 0901   ALT 18 12/12/2016 0901   ALKPHOS 72 12/12/2016 0901   BILITOT 0.62 12/12/2016 0901   GFRNONAA >60 11/30/2016 0505   GFRAA >60 11/30/2016 0505       Assessment & Plan:  76 year old male with a history of long-standing pan ulcerative colitis, ankylosing spondylitis, CAD, hypertension, hyperlipidemia, diabetes and ongoing treatment for 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  1.   Long-standing ulcerative colitis --we will continue balsalazide 2.25 g 3 times daily and 6-MP 50 mg daily.  Deferring further colonoscopic surveillance given his ongoing treatment for metastatic squamous cell cancer of the head neck.  2.  Metastatic squamous cell cancer --ongoing active treatment with chemotherapy with Dr. Alvy Bimler  3.  Dysphagia --related to treatment, particularly radiation.  He is encouraged to eat more solid food.  He is relying on his feeding tube for now.  4.  B12 deficiency --continue IM B12 monthly  3-70-month follow-up, sooner if needed 15 minutes spent with the patient today. Greater than 50% was spent in counseling and coordination of care with the patient

## 2016-12-18 NOTE — Patient Instructions (Addendum)
If you are age 77 or older, your body mass index should be between 23-30. Your Body mass index is 25.31 kg/m. If this is out of the aforementioned range listed, please consider follow up with your Primary Care Provider.  If you are age 65 or younger, your body mass index should be between 19-25. Your Body mass index is 25.31 kg/m. If this is out of the aformentioned range listed, please consider follow up with your Primary Care Provider.   Please continue taking your Balasalazide and Mercaptopurine.  Please follow up with Dr. Hilarie Fredrickson in 3-6 months. You will be contacted by letter when it is time to schedule this.

## 2016-12-19 ENCOUNTER — Other Ambulatory Visit (HOSPITAL_BASED_OUTPATIENT_CLINIC_OR_DEPARTMENT_OTHER): Payer: PPO

## 2016-12-19 ENCOUNTER — Ambulatory Visit: Payer: PPO

## 2016-12-19 ENCOUNTER — Ambulatory Visit (HOSPITAL_BASED_OUTPATIENT_CLINIC_OR_DEPARTMENT_OTHER): Payer: PPO | Admitting: Hematology and Oncology

## 2016-12-19 ENCOUNTER — Ambulatory Visit (HOSPITAL_BASED_OUTPATIENT_CLINIC_OR_DEPARTMENT_OTHER): Payer: PPO

## 2016-12-19 DIAGNOSIS — Z5111 Encounter for antineoplastic chemotherapy: Secondary | ICD-10-CM

## 2016-12-19 DIAGNOSIS — C060 Malignant neoplasm of cheek mucosa: Secondary | ICD-10-CM

## 2016-12-19 DIAGNOSIS — R1310 Dysphagia, unspecified: Secondary | ICD-10-CM

## 2016-12-19 DIAGNOSIS — D61818 Other pancytopenia: Secondary | ICD-10-CM

## 2016-12-19 DIAGNOSIS — D61810 Antineoplastic chemotherapy induced pancytopenia: Secondary | ICD-10-CM | POA: Diagnosis not present

## 2016-12-19 DIAGNOSIS — R918 Other nonspecific abnormal finding of lung field: Secondary | ICD-10-CM

## 2016-12-19 LAB — COMPREHENSIVE METABOLIC PANEL
ALT: 10 U/L (ref 0–55)
ANION GAP: 9 meq/L (ref 3–11)
AST: 17 U/L (ref 5–34)
Albumin: 3.2 g/dL — ABNORMAL LOW (ref 3.5–5.0)
Alkaline Phosphatase: 76 U/L (ref 40–150)
BUN: 19.2 mg/dL (ref 7.0–26.0)
CHLORIDE: 97 meq/L — AB (ref 98–109)
CO2: 30 meq/L — AB (ref 22–29)
CREATININE: 0.8 mg/dL (ref 0.7–1.3)
Calcium: 9.2 mg/dL (ref 8.4–10.4)
Glucose: 230 mg/dl — ABNORMAL HIGH (ref 70–140)
Potassium: 4.2 mEq/L (ref 3.5–5.1)
SODIUM: 136 meq/L (ref 136–145)
Total Bilirubin: 0.54 mg/dL (ref 0.20–1.20)
Total Protein: 6.4 g/dL (ref 6.4–8.3)

## 2016-12-19 LAB — CBC WITH DIFFERENTIAL/PLATELET
BASO%: 0.7 % (ref 0.0–2.0)
Basophils Absolute: 0 10*3/uL (ref 0.0–0.1)
EOS%: 1.1 % (ref 0.0–7.0)
Eosinophils Absolute: 0 10*3/uL (ref 0.0–0.5)
HCT: 29.8 % — ABNORMAL LOW (ref 38.4–49.9)
HGB: 9.7 g/dL — ABNORMAL LOW (ref 13.0–17.1)
LYMPH%: 21.1 % (ref 14.0–49.0)
MCH: 31.4 pg (ref 27.2–33.4)
MCHC: 32.6 g/dL (ref 32.0–36.0)
MCV: 96.4 fL (ref 79.3–98.0)
MONO#: 0.2 10*3/uL (ref 0.1–0.9)
MONO%: 6.9 % (ref 0.0–14.0)
NEUT#: 1.9 10*3/uL (ref 1.5–6.5)
NEUT%: 70.2 % (ref 39.0–75.0)
PLATELETS: 175 10*3/uL (ref 140–400)
RBC: 3.09 10*6/uL — AB (ref 4.20–5.82)
RDW: 15.5 % — ABNORMAL HIGH (ref 11.0–14.6)
WBC: 2.8 10*3/uL — ABNORMAL LOW (ref 4.0–10.3)
lymph#: 0.6 10*3/uL — ABNORMAL LOW (ref 0.9–3.3)

## 2016-12-19 MED ORDER — SODIUM CHLORIDE 0.9% FLUSH
10.0000 mL | Freq: Once | INTRAVENOUS | Status: AC
  Administered 2016-12-19: 10 mL
  Filled 2016-12-19: qty 10

## 2016-12-19 MED ORDER — TRAMADOL HCL 50 MG PO TABS: 50.0000 mg | ORAL_TABLET | Freq: Four times a day (QID) | ORAL | 0 refills | Status: DC | PRN

## 2016-12-19 MED ORDER — PALONOSETRON HCL INJECTION 0.25 MG/5ML
0.2500 mg | Freq: Once | INTRAVENOUS | Status: AC
  Administered 2016-12-19: 0.25 mg via INTRAVENOUS

## 2016-12-19 MED ORDER — HEPARIN SOD (PORK) LOCK FLUSH 100 UNIT/ML IV SOLN
500.0000 [IU] | Freq: Once | INTRAVENOUS | Status: AC | PRN
  Administered 2016-12-19: 500 [IU]
  Filled 2016-12-19: qty 5

## 2016-12-19 MED ORDER — CARBOPLATIN CHEMO INJECTION 450 MG/45ML
176.0000 mg | Freq: Once | INTRAVENOUS | Status: AC
  Administered 2016-12-19: 180 mg via INTRAVENOUS
  Filled 2016-12-19: qty 18

## 2016-12-19 MED ORDER — SODIUM CHLORIDE 0.9 % IV SOLN
45.0000 mg/m2 | Freq: Once | INTRAVENOUS | Status: AC
  Administered 2016-12-19: 84 mg via INTRAVENOUS
  Filled 2016-12-19: qty 14

## 2016-12-19 MED ORDER — DIPHENHYDRAMINE HCL 50 MG/ML IJ SOLN
INTRAMUSCULAR | Status: AC
  Filled 2016-12-19: qty 1

## 2016-12-19 MED ORDER — DIPHENHYDRAMINE HCL 50 MG/ML IJ SOLN
50.0000 mg | Freq: Once | INTRAMUSCULAR | Status: AC
  Administered 2016-12-19: 50 mg via INTRAVENOUS

## 2016-12-19 MED ORDER — FAMOTIDINE IN NACL 20-0.9 MG/50ML-% IV SOLN
INTRAVENOUS | Status: AC
  Filled 2016-12-19: qty 50

## 2016-12-19 MED ORDER — SODIUM CHLORIDE 0.9 % IV SOLN
20.0000 mg | Freq: Once | INTRAVENOUS | Status: AC
  Administered 2016-12-19: 20 mg via INTRAVENOUS
  Filled 2016-12-19: qty 2

## 2016-12-19 MED ORDER — SODIUM CHLORIDE 0.9 % IV SOLN
Freq: Once | INTRAVENOUS | Status: AC
  Administered 2016-12-19: 14:00:00 via INTRAVENOUS

## 2016-12-19 MED ORDER — SODIUM CHLORIDE 0.9% FLUSH
10.0000 mL | INTRAVENOUS | Status: DC | PRN
  Administered 2016-12-19: 10 mL
  Filled 2016-12-19: qty 10

## 2016-12-19 MED ORDER — PALONOSETRON HCL INJECTION 0.25 MG/5ML
INTRAVENOUS | Status: AC
  Filled 2016-12-19: qty 5

## 2016-12-19 MED ORDER — FAMOTIDINE IN NACL 20-0.9 MG/50ML-% IV SOLN
20.0000 mg | Freq: Once | INTRAVENOUS | Status: AC
  Administered 2016-12-19: 20 mg via INTRAVENOUS

## 2016-12-19 NOTE — Patient Instructions (Signed)
   [facility] Discharge Instructions for Patients Receiving Chemotherapy  Today you received the following chemotherapy agents Taxol and Carboplatin   To help prevent nausea and vomiting after your treatment, we encourage you to take your nausea medication as directed.    If you develop nausea and vomiting that is not controlled by your nausea medication, call the clinic.   BELOW ARE SYMPTOMS THAT SHOULD BE REPORTED IMMEDIATELY:  *FEVER GREATER THAN 100.5 F  *CHILLS WITH OR WITHOUT FEVER  NAUSEA AND VOMITING THAT IS NOT CONTROLLED WITH YOUR NAUSEA MEDICATION  *UNUSUAL SHORTNESS OF BREATH  *UNUSUAL BRUISING OR BLEEDING  TENDERNESS IN MOUTH AND THROAT WITH OR WITHOUT PRESENCE OF ULCERS  *URINARY PROBLEMS  *BOWEL PROBLEMS  UNUSUAL RASH Items with * indicate a potential emergency and should be followed up as soon as possible.  Feel free to call the clinic should you have any questions or concerns. The clinic phone number is [phone].  Please show the CHEMO ALERT CARD at check-in to the Emergency Department and triage nurse.   

## 2016-12-20 ENCOUNTER — Telehealth: Payer: Self-pay | Admitting: *Deleted

## 2016-12-20 ENCOUNTER — Encounter: Payer: Self-pay | Admitting: Hematology and Oncology

## 2016-12-20 NOTE — Assessment & Plan Note (Signed)
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

## 2016-12-20 NOTE — Progress Notes (Signed)
[place] OFF[facility] PROGRESS NOTE  Patient Care Team: Janith Lima, MD as PCP - General (Internal Medicine) Sherren Mocha, MD as Consulting Physician (Cardiology) Jarome Matin, MD as Consulting Physician (Dermatology) Pyrtle, Lajuan Lines, MD as Consulting Physician (Gastroenterology) Katy Apo, MD as Consulting Physician (Ophthalmology) Frederik Schmidt, MD as Consulting Physician (Oral Surgery) [name], MD as Attending Physician (Radiation Oncology) Leota Sauers, RN as Oncology Nurse Navigator Karie Mainland, RD as Dietitian (Nutrition) Dannielle Karvonen, RN as [place] Management  SUMMARY OF ONCOLOGIC HISTORY:   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 Tumor measures 0.9 cm in greatest dimension. Tumor extends to the inked deep tissue edge. Depth of invasion: 4 mm (in this material) See note.  Note: an immunostains for P16 was performed at an outside hospital and provided for [place] to review; p16 immunostain is negative. Per outside report, In situ hybridization for high-risk HPV types showed no evidence of transcriptionally active high-risk HPV types.      03/21/2016 Initial Diagnosis    Salient findings:  -EAC's clear -Lesion within left buccal mucosa (pictured below) with about 81mm depth. Width is about 2/3 of the left buccal mucosal surface area by palpation. Extends to the oral commissure.  -Tongue soft and mobile without mucosal lesions -Neck soft with no palpable neck masses -Right handed -Right hand is pink and warm with Allen's test negative (normal)  -Left forearm very sun damaged with multiple treated areas from precancerous or cancerous areas. Right forearm not as dramatic, presumably from sun exposure while driving      04/09/8117 Surgery    RESECTION OF ORAL BUCCAL MUCOSA SELECTIVE NECK  DISSECTION FREE FLAP RADIAL FOREARM PR SPLIT [place] <100 SQCM [15100] (SPLIT THICKNESS SKIN GRAFT LEG        04/13/2016 Pathology Results    A.LEFT ORAL COMMISSURE INFERIOR, EXCISION: No malignancy identified.  B.LEFT ORAL COMMISSURE SUPERIOR, EXCISION: No malignancy identified.  C.LEFT DEEP ORAL COMMISSURE, EXCISION: No malignancy identified.  D.RIGHT POSTERIOR BUCCAL INFERIOR, EXCISION: No malignancy identified.  E.RIGHT POSTERIOR BUCCAL SUPERIOR, EXCISION: No malignancy identified.  F.ORAL CAVITY, LEFT BUCCAL, EXCISION:  Invasive squamous cell carcinoma, moderately differentiated,  keratinizing. Tumor size:2.2 cm. Tumor depth of invasion:0.8 cm. Perineural invasion identified. Margins negative for malignancy. Pathologic stage:pT2 pN2a. See tumor protocol summary below.  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 Tumor deposit size:1.1 cm. Extranodal extension present. Benign salivary gland tissue.  SURGICAL PATHOLOGY CANCER CASE SUMMARY (AJCC 8TH EDITION) LIP AND ORAL CAVITY CAP Protocol posting date: June, 2017 Version: LipOralCavity 4.0.0.0  PROCEDURE: Wide resection of buccal mucosa TUMOR SITE:Oral:Buccal mucosa TUMOR LATERALITY: Left TUMOR FOCALITY: Unifocal TUMOR SIZE:Greatest dimension: 2.2 cm TUMOR DEPTH OF INVASION (DOI):8 mm HISTOLOGIC TYPE:Squamous cell carcinoma, conventional HISTOLOGIC GRADE:G2: Moderately differentiated SPECIMEN MARGINS:Uninvolved by invasive carcinoma  DISTANCE FROM CLOSEST MARGIN:1 mm  SPECIFY MARGIN(S):Deep LYMPHOVASCULAR INVASION:Present PERINEURAL INVASION: Present REGIONAL LYMPH NODES: NUMBER OF LYMPH NODES INVOLVED: 1 NUMBER OF LYMPH NODES EXAMINED: 4 LATERALITY OF LYMPH NODES INVOLVED:  Ipsilateral SIZE OF LARGEST METASTATIC DEPOSIT:1.1 cm EXTRANODAL EXTENSION:Present PATHOLOGIC STAGE CLASSIFICATION (pTNM, AJCC 8th Ed): pT2 pN2a pT2: umor >2 cm but <=4 cm and <=10 mm DOI pN2a:Metastasis in a single ipsilateral lymph node, 3 cm or  smaller in greatest dimension and ENE(+) BIOMARKERS:performed on prior outside biopsy [facility] review case [phone], outside case [id number]) P16 (Immunohistochemistry):Negative HPV (high-risk  types by in situ hybridization):Reported as negative      05/11/2016 Imaging    1. 4 mm left lower lobe pulmonary nodule. Attention on follow-up imaging recommended as metastatic disease not excluded. 2. 2.8 cm left thyroid nodule. Thyroid ultrasound recommended to further evaluate. 3. Bilateral renal cysts. 4. Coronary artery and thoracoabdominal aortic atherosclerosis. 5. Ankylosing spondylitis.        05/11/2016 Imaging    CT neck: Surgical clips in the region of the left buccal mucosa, presumably at the previous primary site. Mild scarring in that region measuring about 8 mm. Cannot assess for residual or recurrent disease in that location, but this may simply be scarring. Previous left submandibular resection and left neck node dissection. No abnormal nodes presently. Multinodular goiter. Ankylosing spondylitis.  C1-2 remains a mobile segment.      05/21/2016 Procedure    1. Successful placement of a right internal jugular approach power injectable Port-A-Cath. The Port a catheter is ready for immediate use. 2. Successful fluoroscopic insertion of a 20-French pull-through gastrostomy tube. The gastrostomy may be used immediately for medication administration and may be utilized in 24 hrs for the initiation of feeds.      05/23/2016 - 07/09/2016 Radiation Therapy    Site/dose:     Left Cheek and bilateral neck / 60 Gy in 30 fractions to gross disease, 54 Gy in 30 fractions to intermediate risk nodal echelons Beams/energy:    IMRT / 6 MV photons       05/25/2016 - 06/15/2016 Chemotherapy    He received weekly reduced dose cisplatin       06/21/2016 Adverse Reaction    Cycle 5 is delayed due to pancytopenia      06/26/2016 - 06/29/2016 Hospital Admission    He was admitted for management of neutropenic fever      08/27/2016 Imaging    1. Negative for pneumothorax or pleural effusion 2. Acute right eighth rib fracture. Questionable fracture deformity of the right ninth rib.      09/04/2016 Imaging    Left-sided rib fractures, without pneumothorax or pleural fluid      09/27/2016 Imaging    CT chest 1. Multiple (at least 6) scattered solid pulmonary nodules throughout both lungs, all new since 05/11/2016 chest CT and increased since 08/27/2016 chest CT, compatible with growing pulmonary metastases. 2. No thoracic adenopathy. 3. Additional findings include stable multinodular goiter and cholelithiasis.  Aortic Atherosclerosis (ICD10-I70.0).      09/27/2016 Imaging    1. Unchanged appearance of the left buccal mucosa resection site without evidence of disease recurrence or progression. 2. Unchanged cervical ankylosis. 3. Mild carotid and aortic atherosclerosis (ICD10-I70.0).      10/16/2016 Imaging    No evidence of active pulmonary disease.  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       11/26/2016 PET scan    1. Bilateral hypermetabolic enlarging nodules consistent with metastatic pulmonary nodules. 2. 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 Second metastatic lesion in the RIGHT iliac wing. 3. No evidence of local recurrence within the oral cavity      11/28/2016 - 11/30/2016 Hospital Admission    He was admitted for acute stroke      11/29/2016 Imaging    MRI brain: Large acute LEFT PCA territory infarct affecting much of the LEFT occipital lobe. No superimposed hemorrhage  is evident      12/05/2016 -  Chemotherapy  He received weekly chemo with carboplatin and Taxol       INTERVAL HISTORY: Please see below for problem oriented charting. He returns for further follow-up and was seen prior to chemotherapy He denies recent side effects from chemotherapy No recent nausea or vomiting He continues to have loose bowel movement No recent infection, fever or chills No new neurological deficit or peripheral neuropathy from treatment He has persistent dysphagia and is not eating much by mouth His primary care doctor is wondering about prescription of Marinol to stimulate appetite  REVIEW OF SYSTEMS:   Constitutional: Denies fevers, chills or abnormal weight loss Eyes: Denies blurriness of vision Ears, nose, mouth, throat, and face: Denies mucositis or sore throat Respiratory: Denies cough, dyspnea or wheezes Cardiovascular: Denies palpitation, chest discomfort or lower extremity swelling Skin: Denies abnormal skin rashes Lymphatics: Denies new lymphadenopathy or easy bruising Neurological:Denies numbness, tingling or new weaknesses Behavioral/Psych: Mood is stable, no new changes  All other systems were reviewed with the patient and are negative.  I have reviewed the past medical history, past surgical history, social history and family history with the patient and they are unchanged from previous note.  ALLERGIES:  has No Known Allergies.  MEDICATIONS:  Current Outpatient Medications  Medication Sig Dispense Refill  . atorvastatin (LIPITOR) 80 MG tablet TAKE 1 TABLET AT 6PM. 90 tablet 2  . balsalazide (COLAZAL) 750 MG capsule TAKE 3 CAPSULES 3 TIMES A DAY. OPEN CAPS & SPRINKLE ON APPLESAUCE OR DISSOLVE IN WATER & TAKE VIA TUBE. 270 capsule 0  . clopidogrel (PLAVIX) 75 MG tablet Take 1 tablet (75 mg total) by mouth daily. 30 tablet 1  . cyanocobalamin (,VITAMIN B-12,) 1000 MCG/ML injection INJECT 1ML ONCE MONTHLY. 3 mL 1  . dronabinol (MARINOL) 2.5 MG  capsule Take 1 capsule (2.5 mg total) by mouth 2 (two) times daily before a meal. 60 capsule 3  . isosorbide mononitrate (ISMO,MONOKET) 20 MG tablet Crush 2 tablets (40 mg total) by mouth 2 (two) times daily at 10 AM and 5 PM. (Patient taking differently: Take 40 mg by mouth 2 (two) times daily. Crush 2 tablets (40 mg total) by mouth 2 (two) times daily at 10 AM and 5 PM.) 120 tablet 11  . mercaptopurine (PURINETHOL) 50 MG tablet TAKE ONE TABLET ONCE A DAY ON AN EMPTY STOMACH 1 HR BEFORE OR 2HRS AFTER A MEAL 30 tablet 2  . metFORMIN (GLUCOPHAGE) 500 MG tablet Place 1 tablet (500 mg total) into feeding tube 2 (two) times daily with a meal. 180 tablet 1  . nitroGLYCERIN (NITROSTAT) 0.4 MG SL tablet Place 1 tablet (0.4 mg total) under the tongue every 5 (five) minutes as needed for chest pain. Max 3 doses. In no response call 911. 25 tablet 5  . Nutritional Supplements (FEEDING SUPPLEMENT, OSMOLITE 1.5 CAL,) LIQD Give 1 and 1/2 cans Osmolite 1.5 via PEG 4 times daily with 60 mL free water before and after bolus feedings. In addition, drink or flush PEG with 240 mL free water TID. Send formula and supplies. 6 Bottle 0  . ondansetron (ZOFRAN) 8 MG tablet Take 1 tablet (8 mg total) by mouth every 8 (eight) hours as needed for nausea. 30 tablet 3  . predniSONE (DELTASONE) 5 MG tablet TAKE 2 TABLETS IN THE MORNING WITH BREAKFAST. (Patient taking differently: TAKE 10MG  IN THE MORNING WITH BREAKFAST.) 60 tablet 0  . promethazine (PHENERGAN) 25 MG tablet Take 1 tablet (25 mg total) by mouth every 6 (six) hours as needed  for nausea. 30 tablet 3  . traMADol (ULTRAM) 50 MG tablet Take 1 tablet (50 mg total) by mouth every 6 (six) hours as needed for moderate pain. 90 tablet 0   No current facility-administered medications for this visit.     PHYSICAL EXAMINATION: ECOG PERFORMANCE STATUS: 1 - Symptomatic but completely ambulatory  Vitals:   12/19/16 1134  BP: (!) 118/58  Pulse: 85  Resp: 18  Temp: 97.8 F  (36.6 C)  SpO2: 99%   Filed Weights   12/19/16 1134  Weight: 161 lb 1.6 oz (73.1 kg)    GENERAL:alert, no distress and comfortable SKIN: skin color, texture, turgor are normal, no rashes or significant lesions EYES: normal, Conjunctiva are pink and non-injected, sclera clear OROPHARYNX:no exudate, no erythema and lips, buccal mucosa, and tongue normal  NECK: supple, thyroid normal size, non-tender, without nodularity LYMPH:  no palpable lymphadenopathy in the cervical, axillary or inguinal LUNGS: clear to auscultation and percussion with normal breathing effort HEART: regular rate & rhythm and no murmurs and no lower extremity edema ABDOMEN:abdomen soft, non-tender and normal bowel sounds Musculoskeletal:no cyanosis of digits and no clubbing  NEURO: alert & oriented x 3 with fluent speech, no focal motor/sensory deficits  LABORATORY DATA:  I have reviewed the data as listed    Component Value Date/Time   NA 136 12/19/2016 1054   K 4.2 12/19/2016 1054   CL 99 (L) 11/30/2016 0505   CO2 30 (H) 12/19/2016 1054   GLUCOSE 230 (H) 12/19/2016 1054   GLUCOSE 98 12/12/2005 0729   BUN 19.2 12/19/2016 1054   CREATININE 0.8 12/19/2016 1054   CALCIUM 9.2 12/19/2016 1054   PROT 6.4 12/19/2016 1054   ALBUMIN 3.2 (L) 12/19/2016 1054   AST 17 12/19/2016 1054   ALT 10 12/19/2016 1054   ALKPHOS 76 12/19/2016 1054   BILITOT 0.54 12/19/2016 1054   GFRNONAA >60 11/30/2016 0505   GFRAA >60 11/30/2016 0505    No results found for: SPEP, UPEP  Lab Results  Component Value Date   WBC 2.8 (L) 12/19/2016   NEUTROABS 1.9 12/19/2016   HGB 9.7 (L) 12/19/2016   HCT 29.8 (L) 12/19/2016   MCV 96.4 12/19/2016   PLT 175 12/19/2016      Chemistry      Component Value Date/Time   NA 136 12/19/2016 1054   K 4.2 12/19/2016 1054   CL 99 (L) 11/30/2016 0505   CO2 30 (H) 12/19/2016 1054   BUN 19.2 12/19/2016 1054   CREATININE 0.8 12/19/2016 1054      Component Value Date/Time   CALCIUM 9.2  12/19/2016 1054   ALKPHOS 76 12/19/2016 1054   AST 17 12/19/2016 1054   ALT 10 12/19/2016 1054   BILITOT 0.54 12/19/2016 1054       RADIOGRAPHIC STUDIES: I have personally reviewed the radiological images as listed and agreed with the findings in the report. Ct Angio Head W Or Wo Contrast  Result Date: 11/28/2016 CLINICAL DATA:  76 year old male acute onset right side hemianopia today. History of prior bilateral cerebellar infarcts. Symptom onset when the patient presented to the cancer center for chemotherapy. EXAM: CT ANGIOGRAPHY HEAD TECHNIQUE: Multidetector CT imaging of the head was performed using the standard protocol during bolus administration of intravenous contrast. Multiplanar CT image reconstructions and MIPs were obtained to evaluate the vascular anatomy. CONTRAST:  65mL ISOVUE-370 IOPAMIDOL (ISOVUE-370) INJECTION 76% COMPARISON:  Head CT without contrast 1322 hours today. FINDINGS: Posterior circulation: Codominant distal vertebral arteries are patent to  the vertebrobasilar junction. There is calcified right V4 segment plaque with mild stenosis. There is noncalcified mild to moderate stenosis of the left V4 segment (series 10, image 26). Right PICA origin an dominant appearing left AICA origin are patent. Patent vertebrobasilar junction. Mild to moderate irregularity and stenosis of the midbasilar artery. The distal basilar is patent. SCA (duplicated, normal variant) and PCA origins appear normal. Posterior communicating arteries are diminutive or absent. The left PCA P1, P2 segments and bifurcation is not patent and normal. The left P3 branches appear within normal limits. Contralateral right PCA branches appear normal. Anterior circulation: Distal cervical ICAs are patent. Tortuous bilateral ICA siphons are patent with mild to moderate left and moderate to severe right side calcified atherosclerosis. There is moderate to severe right ICA supraclinoid segment stenosis. No significant  stenosis on the left. Both ophthalmic artery origins are patent. Bilateral carotid termini are patent. MCA and ACA origins are normal. Anterior communicating artery and bilateral ACA branches are within normal limits. Left MCA M1 segment, left MCA trifurcation and left MCA branches are within normal limits. Right MCA M1 segment, bifurcation, and right MCA branches are within normal limits. Venous sinuses: Patent. Anatomic variants: None. Delayed phase: Stable gray-white matter differentiation throughout the brain. No cortically based acute infarct identified. No acute intracranial hemorrhage identified. No midline shift, mass effect, or evidence of intracranial mass lesion. No abnormal enhancement identified. There is probably a small area of chronic encephalomalacia in the posterior right MCA territory, right peri-Rolandic cortex on series 16, image 22 which appears stable since 06/26/2016. Review of the MIP images confirms the above findings IMPRESSION: 1. Negative for emergent large intracranial vessel occlusion. Dr. Lovenia Kim gave a preliminary report of this finding to Dr. Jerelyn Charles. 2. Posterior circulation atherosclerosis including multifocal mild to moderate bilateral distal vertebral and basilar artery stenosis. The left PCA appears normal. 3. Stable CT appearance of the brain from earlier today. No acute or evolving infarct identified. 4. Bulky right ICA siphon calcified atherosclerosis with moderate or severe supraclinoid ICA stenosis. 5. Elsewhere the anterior circulation is within normal limits. Electronically Signed   By: Genevie Ann M.D.   On: 11/28/2016 15:09   Mr Brain Wo Contrast  Result Date: 11/29/2016 CLINICAL DATA:  RIGHT hemianopsia.  Acute onset. EXAM: MRI HEAD WITHOUT CONTRAST TECHNIQUE: Multiplanar, multiecho pulse sequences of the brain and surrounding structures were obtained without intravenous contrast. COMPARISON:  CT head 11/28/2016.  CTA head 11/28/2016. FINDINGS: Brain: Large  area of restricted diffusion involves much of the LEFT occipital lobe, including the calcarine cortex, corresponding low ADC, consistent with acute infarction. No mass lesion, hemorrhage, hydrocephalus, or extra-axial fluid. Generalized atrophy. Chronic microvascular ischemic change. Scattered areas of chronic lacunar infarction affect the cerebral and cerebellar hemispheres. Vascular: Flow voids are maintained, specifically in the BILATERAL vertebral and basilar arteries. LEFT PCA appears patent as well. Skull and upper cervical spine: Post treatment marrow changes of the skull base and upper cervical region. Sinuses/Orbits: No layering fluid or significant opacity. Normal orbits. Other: Unremarkable. IMPRESSION: Large acute LEFT PCA territory infarct affecting much of the LEFT occipital lobe. No superimposed hemorrhage is evident. Electronically Signed   By: Staci Righter M.D.   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 Patient status post chemo radiation therapy. EXAM: NUCLEAR MEDICINE PET SKULL BASE TO  THIGH TECHNIQUE: 7.6 mCi F-18 FDG was injected intravenously. Full-ring PET imaging was performed from the skull base to thigh after the radiotracer. CT data was obtained and used for attenuation correction and anatomic localization. FASTING BLOOD GLUCOSE:  Value: 264 mg/dl COMPARISON:  CT chest 09/27/2016 FINDINGS: NECK No residual hypermetabolic activity in the oral cavity. No hypermetabolic lymph nodes in the neck. Postsurgical change in the LEFT submandibular neck. CHEST There are several solid pulmonary nodules increased in size from comparison CT and with associated metabolic activity. For example: RIGHT upper lobe nodule measuring 13 mm with SUV max 4.5. 16  mm RIGHT lower lobe nodule with SUV max equal 6.0 19 mm nodule in the medial  LEFT lower lobe with SUV max 5.5. These 3 nodules are on same image slice (image 37, series 2). ABDOMEN/PELVIS Percutaneous gastrostomy tube in the stomach. Gallstones noted. No hypermetabolic lesion in the liver and. Normal adrenal glands. No hypermetabolic abdominopelvic nodes.  Prostate gland SKELETON Expansile metastatic lesion within the anteromedial aspect of the right eighth rib with soft tissue component measuring 3.5 by 4.0 cm and with intense peripheral metabolic activity (SUV max 7.4). Second skeletal metastasis within the RIGHT iliac wing associated with lesion lytic lesion and SUV max equal 6.6. IMPRESSION: 1. Bilateral hypermetabolic enlarging nodules consistent with metastatic pulmonary nodules. 2. 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 Second metastatic lesion in the RIGHT iliac wing. 3. No evidence of local recurrence within the oral cavity Electronically Signed   By: Suzy Bouchard M.D.   On: 11/26/2016 16:05   Ct Head Code Stroke Wo Contrast  Result Date: 11/28/2016 CLINICAL DATA:  Code stroke. 76 year old male scheduled for chemotherapy today, acute onset loss of right side vision in both eyes. A fib. EXAM: CT HEAD WITHOUT CONTRAST TECHNIQUE: Contiguous axial images were obtained from the base of the skull through the vertex without intravenous contrast. COMPARISON:  Neck CT 09/27/2016.  Head CT 06/26/2016 and earlier. FINDINGS: Brain: Stable cerebral volume. No acute intracranial hemorrhage identified. No midline shift, mass effect, or evidence of intracranial mass lesion. No ventriculomegaly. 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 Comparatively mild patchy bilateral white matter hypodensity appears stable. No cortically based acute infarct identified. Vascular: Calcified atherosclerosis at the skull base. No suspicious intracranial vascular hyperdensity. Skull: Degenerative changes in  the upper cervical spine. No acute osseous abnormality identified. Sinuses/Orbits: Clear. Other: Orbits soft tissues appear stable and normal. Visualized scalp soft tissues are within normal limits. ASPECTS [facility] Stroke Program Early CT Score) - Ganglionic level infarction (caudate, lentiform nuclei, internal capsule, insula, M1-M3 cortex): 7 - Supraganglionic infarction (M4-M6 cortex): 3 Total score (0-10 with 10 being normal): 10 IMPRESSION: 1. No acute cortically based infarct or acute intracranial hemorrhage identified. 2. ASPECTS is 10. 3. 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 4. Study discussed by telephone with Dr. Carmin Muskrat on 11/28/2016 at 13:39 . Electronically Signed   By: Genevie Ann M.D.   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 He is not symptomatic We will proceed with treatment without dose adjustment We discussed neutropenic precautions Recommend further discussion with his gastroenterologist about possibility of stopping 6-MP due to potential worsening pancytopenia  Dysphagia  Dysphagia is stable He has appointment to see speech and language therapist  I encouraged him to increase oral intake as tolerated with the goal to wean him off feeding tube  He has poor appetite despite being on prednisone therapy He would like to try Marinol and there is no contraindication from my perspective for him to proceed   No orders of the defined types were placed in this encounter.  All questions were answered. The patient knows to call the clinic with any problems, questions or concerns. No barriers to learning was detected. I spent 15 minutes counseling the patient face to face.  The total time spent in the appointment was 20 minutes and more than 50% was on counseling and review of test results     [name], MD 12/20/2016 3:46 PM

## 2016-12-20 NOTE — Telephone Encounter (Signed)
Will you find out if there is any other paper work that is needed to be filled out?

## 2016-12-20 NOTE — Assessment & Plan Note (Signed)
He tolerated chemotherapy well We will proceed with treatment without dose adjustment

## 2016-12-20 NOTE — Telephone Encounter (Signed)
Printed, signed and given to [place]

## 2016-12-20 NOTE — Telephone Encounter (Signed)
RX has been faxed.

## 2016-12-20 NOTE — Telephone Encounter (Signed)
-----   Message from Jerene Bears, MD sent at 12/20/2016  4:35 PM EST ----- Yes, I think this is okay.  Certainly do not want to put him at risk for infection/complication Thanks Janalyn Harder,  Please let Mr. Glendinning know that after discussing with Dr. Alvy Bimler we are going to stop 6-mp over concern for low white blood cell counts. Thanks JMP  ----- Message ----- From: [name], MD Sent: 12/20/2016   3:47 PM To: Jerene Bears, MD  Hi,  Can he stop 6MP? I am worried about his blood counts. He can stay on his other medication. He is also on low dose prednisone. Thanks for your input  [place]

## 2016-12-20 NOTE — Telephone Encounter (Signed)
Left voicemail for patient to call back. 

## 2016-12-20 NOTE — Telephone Encounter (Signed)
Spoke with [facility], all they need is a prescription faxed over.   Fax #: [phone]

## 2016-12-20 NOTE — Assessment & Plan Note (Addendum)
Dysphagia is stable He has appointment to see speech and language therapist  I encouraged him to increase oral intake as tolerated with the goal to wean him off feeding tube  He has poor appetite despite being on prednisone therapy He would like to try Marinol and there is no contraindication from my perspective for him to proceed

## 2016-12-21 ENCOUNTER — Telehealth: Payer: Self-pay | Admitting: Internal Medicine

## 2016-12-21 ENCOUNTER — Ambulatory Visit: Payer: PPO | Admitting: Medical

## 2016-12-21 DIAGNOSIS — K9423 Gastrostomy malfunction: Secondary | ICD-10-CM | POA: Insufficient documentation

## 2016-12-21 NOTE — Telephone Encounter (Signed)
Copied from [place] [id number]. Topic: General - Other >> Dec 21, 2016  4:46 PM Bea Graff, NT wrote: Reason for CRM: Clair Gulling from [facility] calling to let Dr. Ronnald Ramp know 1 physical therapy visit skipped this week per pts request, need verbal order to make this up in the future. Pt would benefit from a right ankle foot orthosis, please send referral to [facility]. CB#: [phone]
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
## 2016-12-21 NOTE — Telephone Encounter (Signed)
Left voicemail for patient to call back. 

## 2016-12-22 ENCOUNTER — Other Ambulatory Visit: Payer: Self-pay | Admitting: Hematology and Oncology

## 2016-12-22 ENCOUNTER — Other Ambulatory Visit: Payer: Self-pay | Admitting: Internal Medicine

## 2016-12-24 NOTE — Telephone Encounter (Signed)
I have spoken to patient to advise that at the advise of Dr Alvy Bimler and Dr Hilarie Fredrickson, he should discontinue mercaptopurine at this time over concern of worsening WBC counts. Patient verbalizes understanding.

## 2016-12-26 NOTE — Telephone Encounter (Signed)
Can you contact [facility] and ask what the rx needs to say and if there is any other paper work to complete for pt to receive the orthosis?

## 2016-12-26 NOTE — Telephone Encounter (Signed)
Spoke with [facility], They just need a RX sent over for R ankle foot orthosis - it did not need to say anything special on it.  FAX: [phone]

## 2016-12-27 NOTE — Telephone Encounter (Signed)
This has been faxed.

## 2016-12-28 ENCOUNTER — Telehealth: Payer: Self-pay | Admitting: Internal Medicine

## 2016-12-28 NOTE — Telephone Encounter (Signed)
Copied from [place] [id number]. Topic: General - Other >> Dec 28, 2016 10:59 AM Darl Householder, RMA wrote: Reason for CRM: Clair Gulling from [facility] called and stated that pt is requesting delay 1 visit this week until he acquires new equipment through [facility] ([facility]) and request make up visit after he receives equipment, please call Clair Gulling [phone]

## 2016-12-31 DIAGNOSIS — L738 Other specified follicular disorders: Secondary | ICD-10-CM | POA: Diagnosis not present

## 2016-12-31 DIAGNOSIS — Z85828 Personal history of other malignant neoplasm of skin: Secondary | ICD-10-CM | POA: Diagnosis not present

## 2016-12-31 DIAGNOSIS — L821 Other seborrheic keratosis: Secondary | ICD-10-CM | POA: Diagnosis not present

## 2016-12-31 NOTE — Telephone Encounter (Signed)
This has been sent to [facility]. Previous notes regarding same. Closing this note.

## 2017-01-02 ENCOUNTER — Telehealth: Payer: Self-pay

## 2017-01-02 NOTE — Telephone Encounter (Signed)
Key: [id number]

## 2017-01-04 ENCOUNTER — Ambulatory Visit (HOSPITAL_BASED_OUTPATIENT_CLINIC_OR_DEPARTMENT_OTHER): Payer: PPO

## 2017-01-04 ENCOUNTER — Ambulatory Visit: Payer: PPO | Admitting: Nutrition

## 2017-01-04 ENCOUNTER — Encounter: Payer: Self-pay | Admitting: Hematology and Oncology

## 2017-01-04 ENCOUNTER — Inpatient Hospital Stay: Payer: PPO | Attending: Hematology and Oncology | Admitting: Hematology and Oncology

## 2017-01-04 ENCOUNTER — Encounter: Payer: PPO | Admitting: Nutrition

## 2017-01-04 ENCOUNTER — Ambulatory Visit: Payer: PPO

## 2017-01-04 ENCOUNTER — Other Ambulatory Visit (HOSPITAL_BASED_OUTPATIENT_CLINIC_OR_DEPARTMENT_OTHER): Payer: PPO

## 2017-01-04 DIAGNOSIS — D61810 Antineoplastic chemotherapy induced pancytopenia: Secondary | ICD-10-CM

## 2017-01-04 DIAGNOSIS — C069 Malignant neoplasm of mouth, unspecified: Secondary | ICD-10-CM | POA: Diagnosis not present

## 2017-01-04 DIAGNOSIS — R634 Abnormal weight loss: Secondary | ICD-10-CM | POA: Insufficient documentation

## 2017-01-04 DIAGNOSIS — Z5111 Encounter for antineoplastic chemotherapy: Secondary | ICD-10-CM | POA: Insufficient documentation

## 2017-01-04 DIAGNOSIS — R1310 Dysphagia, unspecified: Secondary | ICD-10-CM

## 2017-01-04 DIAGNOSIS — C060 Malignant neoplasm of cheek mucosa: Secondary | ICD-10-CM | POA: Diagnosis not present

## 2017-01-04 DIAGNOSIS — E860 Dehydration: Secondary | ICD-10-CM | POA: Insufficient documentation

## 2017-01-04 DIAGNOSIS — I69991 Dysphagia following unspecified cerebrovascular disease: Secondary | ICD-10-CM | POA: Diagnosis not present

## 2017-01-04 DIAGNOSIS — L989 Disorder of the skin and subcutaneous tissue, unspecified: Secondary | ICD-10-CM | POA: Insufficient documentation

## 2017-01-04 DIAGNOSIS — D61818 Other pancytopenia: Secondary | ICD-10-CM

## 2017-01-04 DIAGNOSIS — I639 Cerebral infarction, unspecified: Secondary | ICD-10-CM

## 2017-01-04 DIAGNOSIS — K5010 Crohn's disease of large intestine without complications: Secondary | ICD-10-CM | POA: Insufficient documentation

## 2017-01-04 DIAGNOSIS — G893 Neoplasm related pain (acute) (chronic): Secondary | ICD-10-CM | POA: Diagnosis not present

## 2017-01-04 DIAGNOSIS — R630 Anorexia: Secondary | ICD-10-CM | POA: Insufficient documentation

## 2017-01-04 DIAGNOSIS — K9423 Gastrostomy malfunction: Secondary | ICD-10-CM | POA: Insufficient documentation

## 2017-01-04 DIAGNOSIS — R918 Other nonspecific abnormal finding of lung field: Secondary | ICD-10-CM

## 2017-01-04 LAB — CBC WITH DIFFERENTIAL/PLATELET
BASO%: 0.1 % (ref 0.0–2.0)
BASOS ABS: 0 10*3/uL (ref 0.0–0.1)
EOS%: 0.3 % (ref 0.0–7.0)
Eosinophils Absolute: 0 10*3/uL (ref 0.0–0.5)
HEMATOCRIT: 29.7 % — AB (ref 38.4–49.9)
HGB: 9.7 g/dL — ABNORMAL LOW (ref 13.0–17.1)
LYMPH#: 0.4 10*3/uL — AB (ref 0.9–3.3)
LYMPH%: 8.3 % — AB (ref 14.0–49.0)
MCH: 31.7 pg (ref 27.2–33.4)
MCHC: 32.7 g/dL (ref 32.0–36.0)
MCV: 96.8 fL (ref 79.3–98.0)
MONO#: 0.5 10*3/uL (ref 0.1–0.9)
MONO%: 10.4 % (ref 0.0–14.0)
NEUT#: 3.6 10*3/uL (ref 1.5–6.5)
NEUT%: 80.9 % — AB (ref 39.0–75.0)
Platelets: 100 10*3/uL — ABNORMAL LOW (ref 140–400)
RBC: 3.07 10*6/uL — AB (ref 4.20–5.82)
RDW: 18.2 % — ABNORMAL HIGH (ref 11.0–14.6)
WBC: 4.5 10*3/uL (ref 4.0–10.3)

## 2017-01-04 LAB — COMPREHENSIVE METABOLIC PANEL
ALT: 12 U/L (ref 0–55)
AST: 14 U/L (ref 5–34)
Albumin: 3.2 g/dL — ABNORMAL LOW (ref 3.5–5.0)
Alkaline Phosphatase: 73 U/L (ref 40–150)
Anion Gap: 7 mEq/L (ref 3–11)
BUN: 17.4 mg/dL (ref 7.0–26.0)
CALCIUM: 9.1 mg/dL (ref 8.4–10.4)
CHLORIDE: 98 meq/L (ref 98–109)
CO2: 30 meq/L — AB (ref 22–29)
Creatinine: 0.8 mg/dL (ref 0.7–1.3)
EGFR: >60 mL/min/{1.73_m2} (ref >60)
Glucose: 253 mg/dl — ABNORMAL HIGH (ref 70–140)
Potassium: 4.5 mEq/L (ref 3.5–5.1)
Sodium: 134 mEq/L — ABNORMAL LOW (ref 136–145)
Total Bilirubin: 0.79 mg/dL (ref 0.20–1.20)
Total Protein: 6.2 g/dL — ABNORMAL LOW (ref 6.4–8.3)

## 2017-01-04 MED ORDER — SODIUM CHLORIDE 0.9 % IV SOLN
Freq: Once | INTRAVENOUS | Status: AC
  Administered 2017-01-04: 13:00:00 via INTRAVENOUS

## 2017-01-04 MED ORDER — DIPHENHYDRAMINE HCL 50 MG/ML IJ SOLN
INTRAMUSCULAR | Status: AC
  Filled 2017-01-04: qty 1

## 2017-01-04 MED ORDER — SODIUM CHLORIDE 0.9 % IV SOLN
180.0000 mg | Freq: Once | INTRAVENOUS | Status: AC
  Administered 2017-01-04: 180 mg via INTRAVENOUS
  Filled 2017-01-04: qty 18

## 2017-01-04 MED ORDER — SODIUM CHLORIDE 0.9 % IV SOLN
20.0000 mg | Freq: Once | INTRAVENOUS | Status: AC
  Administered 2017-01-04: 20 mg via INTRAVENOUS
  Filled 2017-01-04: qty 2

## 2017-01-04 MED ORDER — DIPHENHYDRAMINE HCL 50 MG/ML IJ SOLN
50.0000 mg | Freq: Once | INTRAMUSCULAR | Status: AC
  Administered 2017-01-04: 50 mg via INTRAVENOUS

## 2017-01-04 MED ORDER — HEPARIN SOD (PORK) LOCK FLUSH 100 UNIT/ML IV SOLN
500.0000 [IU] | Freq: Once | INTRAVENOUS | Status: AC | PRN
  Administered 2017-01-04: 500 [IU]
  Filled 2017-01-04: qty 5

## 2017-01-04 MED ORDER — SODIUM CHLORIDE 0.9% FLUSH
10.0000 mL | Freq: Once | INTRAVENOUS | Status: AC
  Administered 2017-01-04: 10 mL
  Filled 2017-01-04: qty 10

## 2017-01-04 MED ORDER — FAMOTIDINE IN NACL 20-0.9 MG/50ML-% IV SOLN
20.0000 mg | Freq: Once | INTRAVENOUS | Status: AC
  Administered 2017-01-04: 20 mg via INTRAVENOUS

## 2017-01-04 MED ORDER — PALONOSETRON HCL INJECTION 0.25 MG/5ML
0.2500 mg | Freq: Once | INTRAVENOUS | Status: AC
  Administered 2017-01-04: 0.25 mg via INTRAVENOUS

## 2017-01-04 MED ORDER — PALONOSETRON HCL INJECTION 0.25 MG/5ML
INTRAVENOUS | Status: AC
  Filled 2017-01-04: qty 5

## 2017-01-04 MED ORDER — SODIUM CHLORIDE 0.9% FLUSH
10.0000 mL | INTRAVENOUS | Status: DC | PRN
  Administered 2017-01-04: 10 mL
  Filled 2017-01-04: qty 10

## 2017-01-04 MED ORDER — PACLITAXEL CHEMO INJECTION 300 MG/50ML
45.0000 mg/m2 | Freq: Once | INTRAVENOUS | Status: AC
  Administered 2017-01-04: 84 mg via INTRAVENOUS
  Filled 2017-01-04: qty 14

## 2017-01-04 MED ORDER — FAMOTIDINE IN NACL 20-0.9 MG/50ML-% IV SOLN
INTRAVENOUS | Status: AC
  Filled 2017-01-04: qty 50

## 2017-01-04 NOTE — Assessment & Plan Note (Signed)
He denies new neurological deficit He will continue PT/OT

## 2017-01-04 NOTE — Assessment & Plan Note (Signed)
The patient has intermittent rib pain Low-dose tramadol appears to control his pain well For now, we will continue tramadol as needed for pain

## 2017-01-04 NOTE — Progress Notes (Signed)
[facility] OFFICE PROGRESS NOTE  Patient Care Team: Janith Lima, MD as PCP - General (Internal Medicine) Sherren Mocha, MD as Consulting Physician (Cardiology) Jarome Matin, MD as Consulting Physician (Dermatology) Pyrtle, Lajuan Lines, MD as Consulting Physician (Gastroenterology) Katy Apo, MD as Consulting Physician (Ophthalmology) Frederik Schmidt, MD as Consulting Physician (Oral Surgery) [name], MD as Attending Physician (Radiation Oncology) Leota Sauers, RN as Oncology Nurse Navigator Karie Mainland, RD as Dietitian (Nutrition) Dannielle Karvonen, RN as [facility]  SUMMARY OF ONCOLOGIC HISTORY:   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 Tumor measures 0.9 cm in greatest dimension. Tumor extends to the inked deep tissue edge. Depth of invasion: 4 mm (in this material) See note.  Note: an immunostains for P16 was performed at an outside hospital and provided for [place] to review; p16 immunostain is negative. Per outside report, In situ hybridization for high-risk HPV types showed no evidence of transcriptionally active high-risk HPV types.      03/21/2016 Initial Diagnosis    Salient findings:  -EAC's clear -Lesion within left buccal mucosa (pictured below) with about 61mm depth. Width is about 2/3 of the left buccal mucosal surface area by palpation. Extends to the oral commissure.  -Tongue soft and mobile without mucosal lesions -Neck soft with no palpable neck masses -Right handed -Right hand is pink and warm with Allen's test negative (normal)  -Left forearm very sun damaged with multiple treated areas from precancerous or cancerous areas. Right forearm not as dramatic, presumably from sun exposure while driving      07/26/3662 Surgery    RESECTION OF ORAL BUCCAL MUCOSA SELECTIVE NECK  DISSECTION FREE FLAP RADIAL FOREARM PR SPLIT [place] <100 SQCM [15100] (SPLIT THICKNESS SKIN GRAFT LEG        04/13/2016 Pathology Results    A.LEFT ORAL COMMISSURE INFERIOR, EXCISION: No malignancy identified.  B.LEFT ORAL COMMISSURE SUPERIOR, EXCISION: No malignancy identified.  C.LEFT DEEP ORAL COMMISSURE, EXCISION: No malignancy identified.  D.RIGHT POSTERIOR BUCCAL INFERIOR, EXCISION: No malignancy identified.  E.RIGHT POSTERIOR BUCCAL SUPERIOR, EXCISION: No malignancy identified.  F.ORAL CAVITY, LEFT BUCCAL, EXCISION:  Invasive squamous cell carcinoma, moderately differentiated,  keratinizing. Tumor size:2.2 cm. Tumor depth of invasion:0.8 cm. Perineural invasion identified. Margins negative for malignancy. Pathologic stage:pT2 pN2a. See tumor protocol summary below.  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 Tumor deposit size:1.1 cm. Extranodal extension present. Benign salivary gland tissue.  SURGICAL PATHOLOGY CANCER CASE SUMMARY (AJCC 8TH EDITION) LIP AND ORAL CAVITY CAP Protocol posting date: June, 2017 Version: LipOralCavity 4.0.0.0  PROCEDURE: Wide resection of buccal mucosa TUMOR SITE:Oral:Buccal mucosa TUMOR LATERALITY: Left TUMOR FOCALITY: Unifocal TUMOR SIZE:Greatest dimension: 2.2 cm TUMOR DEPTH OF INVASION (DOI):8 mm HISTOLOGIC TYPE:Squamous cell carcinoma, conventional HISTOLOGIC GRADE:G2: Moderately differentiated SPECIMEN MARGINS:Uninvolved by invasive carcinoma  DISTANCE FROM CLOSEST MARGIN:1 mm  SPECIFY MARGIN(S):Deep LYMPHOVASCULAR INVASION:Present PERINEURAL INVASION: Present REGIONAL LYMPH NODES: NUMBER OF LYMPH NODES INVOLVED: 1 NUMBER OF LYMPH NODES EXAMINED: 4 LATERALITY OF LYMPH NODES INVOLVED:  Ipsilateral SIZE OF LARGEST METASTATIC DEPOSIT:1.1 cm EXTRANODAL EXTENSION:Present PATHOLOGIC STAGE CLASSIFICATION (pTNM, AJCC 8th Ed): pT2 pN2a pT2: umor >2 cm but <=4 cm and <=10 mm DOI pN2a:Metastasis in a single ipsilateral lymph node, 3 cm or  smaller in greatest dimension and ENE(+) BIOMARKERS:performed on prior outside biopsy [facility] review case [phone], outside case [id number]) P16 (Immunohistochemistry):Negative HPV (high-risk  types by in situ hybridization):Reported as negative      05/11/2016 Imaging    1. 4 mm left lower lobe pulmonary nodule. Attention on follow-up imaging recommended as metastatic disease not excluded. 2. 2.8 cm left thyroid nodule. Thyroid ultrasound recommended to further evaluate. 3. Bilateral renal cysts. 4. Coronary artery and thoracoabdominal aortic atherosclerosis. 5. Ankylosing spondylitis.        05/11/2016 Imaging    CT neck: Surgical clips in the region of the left buccal mucosa, presumably at the previous primary site. Mild scarring in that region measuring about 8 mm. Cannot assess for residual or recurrent disease in that location, but this may simply be scarring. Previous left submandibular resection and left neck node dissection. No abnormal nodes presently. Multinodular goiter. Ankylosing spondylitis.  C1-2 remains a mobile segment.      05/21/2016 Procedure    1. Successful placement of a right internal jugular approach power injectable Port-A-Cath. The Port a catheter is ready for immediate use. 2. Successful fluoroscopic insertion of a 20-French pull-through gastrostomy tube. The gastrostomy may be used immediately for medication administration and may be utilized in 24 hrs for the initiation of feeds.      05/23/2016 - 07/09/2016 Radiation Therapy    Site/dose:     Left Cheek and bilateral neck / 60 Gy in 30 fractions to gross disease, 54 Gy in 30 fractions to intermediate risk nodal echelons Beams/energy:    IMRT / 6 MV photons       05/25/2016 - 06/15/2016 Chemotherapy    He received weekly reduced dose cisplatin       06/21/2016 Adverse Reaction    Cycle 5 is delayed due to pancytopenia      06/26/2016 - 06/29/2016 Hospital Admission    He was admitted for management of neutropenic fever      08/27/2016 Imaging    1. Negative for pneumothorax or pleural effusion 2. Acute right eighth rib fracture. Questionable fracture deformity of the right ninth rib.      09/04/2016 Imaging    Left-sided rib fractures, without pneumothorax or pleural fluid      09/27/2016 Imaging    CT chest 1. Multiple (at least 6) scattered solid pulmonary nodules throughout both lungs, all new since 05/11/2016 chest CT and increased since 08/27/2016 chest CT, compatible with growing pulmonary metastases. 2. No thoracic adenopathy. 3. Additional findings include stable multinodular goiter and cholelithiasis.  Aortic Atherosclerosis (ICD10-I70.0).      09/27/2016 Imaging    1. Unchanged appearance of the left buccal mucosa resection site without evidence of disease recurrence or progression. 2. Unchanged cervical ankylosis. 3. Mild carotid and aortic atherosclerosis (ICD10-I70.0).      10/16/2016 Imaging    No evidence of active pulmonary disease.  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       11/26/2016 PET scan    1. Bilateral hypermetabolic enlarging nodules consistent with metastatic pulmonary nodules. 2. 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 Second metastatic lesion in the RIGHT iliac wing. 3. No evidence of local recurrence within the oral cavity      11/28/2016 - 11/30/2016 Hospital Admission    He was admitted for acute stroke      11/29/2016 Imaging    MRI brain: Large acute LEFT PCA territory infarct affecting much of the LEFT occipital lobe. No superimposed hemorrhage  is evident      12/05/2016 -  Chemotherapy  He received weekly chemo with carboplatin and Taxol       INTERVAL HISTORY: Please see below for problem oriented charting. He returns with his wife for further follow-up He is eating better He has attempted various soft diet He has some mild tickling in his throat with non-productive cough but denies recent aspiration He denies new neurological deficits There were concerns about feeding tube malfunction but currently it is working well His pain is stable in his rib cage He denies recent nausea or constipation Denies peripheral neuropathy from treatment  REVIEW OF SYSTEMS:   Constitutional: Denies fevers, chills or abnormal weight loss Eyes: Denies blurriness of vision Ears, nose, mouth, throat, and face: Denies mucositis or sore throat Cardiovascular: Denies palpitation, chest discomfort or lower extremity swelling Gastrointestinal:  Denies nausea, heartburn or change in bowel habits Skin: Denies abnormal skin rashes Lymphatics: Denies new lymphadenopathy or easy bruising Neurological:Denies numbness, tingling or new weaknesses Behavioral/Psych: Mood is stable, no new changes  All other systems were reviewed with the patient and are negative.  I have reviewed the past medical history, past surgical history, social history and family history with the patient and they are unchanged from previous note.  ALLERGIES:  has No Known Allergies.  MEDICATIONS:  Current Outpatient Medications  Medication Sig Dispense Refill  . atorvastatin (LIPITOR) 80 MG tablet TAKE 1 TABLET AT 6PM. 90 tablet 2  . balsalazide (COLAZAL) 750 MG capsule TAKE 3 CAPSULES 3 TIMES A DAY. OPEN CAPS & SPRINKLE ON APPLESAUCE OR DISSOLVE IN WATER & TAKE VIA TUBE. 270 capsule 2  . clopidogrel (PLAVIX) 75 MG tablet Take 1 tablet (75 mg total) by mouth daily. 30 tablet 1  . cyanocobalamin (,VITAMIN B-12,) 1000 MCG/ML injection INJECT 1ML ONCE MONTHLY. 3 mL 1  .  dronabinol (MARINOL) 2.5 MG capsule Take 1 capsule (2.5 mg total) by mouth 2 (two) times daily before a meal. 60 capsule 3  . isosorbide mononitrate (ISMO,MONOKET) 20 MG tablet Crush 2 tablets (40 mg total) by mouth 2 (two) times daily at 10 AM and 5 PM. (Patient taking differently: Take 40 mg by mouth 2 (two) times daily. Crush 2 tablets (40 mg total) by mouth 2 (two) times daily at 10 AM and 5 PM.) 120 tablet 11  . metFORMIN (GLUCOPHAGE) 500 MG tablet Place 1 tablet (500 mg total) into feeding tube 2 (two) times daily with a meal. 180 tablet 1  . nitroGLYCERIN (NITROSTAT) 0.4 MG SL tablet Place 1 tablet (0.4 mg total) under the tongue every 5 (five) minutes as needed for chest pain. Max 3 doses. In no response call 911. 25 tablet 5  . Nutritional Supplements (FEEDING SUPPLEMENT, OSMOLITE 1.5 CAL,) LIQD Give 1 and 1/2 cans Osmolite 1.5 via PEG 4 times daily with 60 mL free water before and after bolus feedings. In addition, drink or flush PEG with 240 mL free water TID. Send formula and supplies. 6 Bottle 0  . ondansetron (ZOFRAN) 8 MG tablet Take 1 tablet (8 mg total) by mouth every 8 (eight) hours as needed for nausea. 30 tablet 3  . predniSONE (DELTASONE) 5 MG tablet TAKE 2 TABLETS IN THE MORNING WITH BREAKFAST. 60 tablet 0  . promethazine (PHENERGAN) 25 MG tablet Take 1 tablet (25 mg total) by mouth every 6 (six) hours as needed for nausea. 30 tablet 3  . traMADol (ULTRAM) 50 MG tablet Take 1 tablet (50 mg total) by mouth every 6 (six) hours as needed for moderate pain. 90 tablet 0  No current facility-administered medications for this visit.     PHYSICAL EXAMINATION: ECOG PERFORMANCE STATUS: 2 - Symptomatic, <50% confined to bed  Vitals:   01/04/17 1132  BP: 125/86  Pulse: 99  Resp: 18  Temp: 97.9 F (36.6 C)  SpO2: 98%   Filed Weights   01/04/17 1132  Weight: 160 lb 8 oz (72.8 kg)    GENERAL:alert, no distress and comfortable SKIN: skin color, texture, turgor are normal, no  rashes or significant lesions EYES: normal, Conjunctiva are pink and non-injected, sclera clear OROPHARYNX:no exudate, no erythema and lips, buccal mucosa, and tongue normal  NECK: supple, thyroid normal size, non-tender, without nodularity LYMPH:  no palpable lymphadenopathy in the cervical, axillary or inguinal LUNGS: clear to auscultation and percussion with normal breathing effort HEART: regular rate & rhythm and no murmurs and no lower extremity edema ABDOMEN:abdomen soft, non-tender and normal bowel sounds.  Feeding tube site looks okay Musculoskeletal:no cyanosis of digits and no clubbing  NEURO: alert & oriented x 3 with fluent speech, no focal motor/sensory deficits  LABORATORY DATA:  I have reviewed the data as listed    Component Value Date/Time   NA 134 (L) 01/04/2017 1114   K 4.5 01/04/2017 1114   CL 99 (L) 11/30/2016 0505   CO2 30 (H) 01/04/2017 1114   GLUCOSE 253 (H) 01/04/2017 1114   GLUCOSE 98 12/12/2005 0729   BUN 17.4 01/04/2017 1114   CREATININE 0.8 01/04/2017 1114   CALCIUM 9.1 01/04/2017 1114   PROT 6.2 (L) 01/04/2017 1114   ALBUMIN 3.2 (L) 01/04/2017 1114   AST 14 01/04/2017 1114   ALT 12 01/04/2017 1114   ALKPHOS 73 01/04/2017 1114   BILITOT 0.79 01/04/2017 1114   GFRNONAA >60 11/30/2016 0505   GFRAA >60 11/30/2016 0505    No results found for: SPEP, UPEP  Lab Results  Component Value Date   WBC 4.5 01/04/2017   NEUTROABS 3.6 01/04/2017   HGB 9.7 (L) 01/04/2017   HCT 29.7 (L) 01/04/2017   MCV 96.8 01/04/2017   PLT 100 (L) 01/04/2017      Chemistry      Component Value Date/Time   NA 134 (L) 01/04/2017 1114   K 4.5 01/04/2017 1114   CL 99 (L) 11/30/2016 0505   CO2 30 (H) 01/04/2017 1114   BUN 17.4 01/04/2017 1114   CREATININE 0.8 01/04/2017 1114      Component Value Date/Time   CALCIUM 9.1 01/04/2017 1114   ALKPHOS 73 01/04/2017 1114   AST 14 01/04/2017 1114   ALT 12 01/04/2017 1114   BILITOT 0.79 01/04/2017 1114      ASSESSMENT  & PLAN:  Buccal mucosa squamous cell carcinoma (H[facility]) He tolerated chemotherapy well We will proceed with treatment without dose adjustment I plan to repeat imaging study after 6 dose of treatment  Acquired pancytopenia ([place]) He has acquired pancytopenia due to chemotherapy He is not symptomatic We will proceed with treatment without dose adjustment  Cancer associated pain The patient has intermittent rib pain Low-dose tramadol appears to control his pain well For now, we will continue tramadol as needed for pain  Dysphagia Dysphagia is stable I encouraged him to increase oral intake as tolerated with the goal to wean him off feeding tube  He has poor appetite despite being on prednisone therapy He would like to try Marinol  He is eating better I encouraged him to increase oral intake as tolerated He will continue close follow-up with dietitian  Acute cardioembolic stroke (  [place]) He denies new neurological deficit He will continue PT/OT   No orders of the defined types were placed in this encounter.  All questions were answered. The patient knows to call the clinic with any problems, questions or concerns. No barriers to learning was detected. I spent 25 minutes counseling the patient face to face. The total time spent in the appointment was 30 minutes and more than 50% was on counseling and review of test results     [name], MD 01/04/2017 12:19 PM

## 2017-01-04 NOTE — Assessment & Plan Note (Signed)
He tolerated chemotherapy well We will proceed with treatment without dose adjustment I plan to repeat imaging study after 6 dose of treatment

## 2017-01-04 NOTE — Patient Instructions (Signed)
[facility] Discharge Instructions for Patients Receiving Chemotherapy  Today you received the following chemotherapy agents: Paclitaxel (Taxol) and Carboplatin (Paraplatin).  To help prevent nausea and vomiting after your treatment, we encourage you to take your nausea medication as prescribed.  If you develop nausea and vomiting that is not controlled by your nausea medication, call the clinic.   BELOW ARE SYMPTOMS THAT SHOULD BE REPORTED IMMEDIATELY:  *FEVER GREATER THAN 100.5 F  *CHILLS WITH OR WITHOUT FEVER  NAUSEA AND VOMITING THAT IS NOT CONTROLLED WITH YOUR NAUSEA MEDICATION  *UNUSUAL SHORTNESS OF BREATH  *UNUSUAL BRUISING OR BLEEDING  TENDERNESS IN MOUTH AND THROAT WITH OR WITHOUT PRESENCE OF ULCERS  *URINARY PROBLEMS  *BOWEL PROBLEMS  UNUSUAL RASH Items with * indicate a potential emergency and should be followed up as soon as possible.  Feel free to call the clinic should you have any questions or concerns. The clinic phone number is [phone].  Please show the CHEMO ALERT CARD at check-in to the Emergency Department and triage nurse.   

## 2017-01-04 NOTE — Assessment & Plan Note (Signed)
He has acquired pancytopenia due to chemotherapy He is not symptomatic We will proceed with treatment without dose adjustment

## 2017-01-04 NOTE — Assessment & Plan Note (Signed)
Dysphagia is stable I encouraged him to increase oral intake as tolerated with the goal to wean him off feeding tube  He has poor appetite despite being on prednisone therapy He would like to try Marinol  He is eating better I encouraged him to increase oral intake as tolerated He will continue close follow-up with dietitian

## 2017-01-04 NOTE — Progress Notes (Signed)
Brief nutrition follow-up completed with patient during infusion for left buccal mucosa cancer. Weight stable and documented as 160.5 pounds January 4. Patient continues to use 6 bottles Osmolite 1.5 providing 2130 cal, 89.4 g protein. Patient continues to eat very small amounts of food and drink liquids. Patient has no complaints today.  Estimated nutrition needs: 2340-2540 calories, 106-120 grams protein, 2.5 L fluid.  Nutrition diagnosis: Unintended weight loss improved. Severe malnutrition ongoing.  Intervention: Patient educated to continue 6 bottles Osmolite 1.5 daily via PEG. Encouraged patient to continue high-calorie high-protein foods.   Questions answered.  Teach back method used.  Monitoring, evaluation, goals: Patient will work to increase calories and protein.  Next visit: Friday, January 18, during infusion.  **Disclaimer: This note was dictated with voice recognition software. Similar sounding words can inadvertently be transcribed and this note may contain transcription errors which may not have been corrected upon publication of note.**

## 2017-01-07 ENCOUNTER — Encounter: Payer: Self-pay | Admitting: General Practice

## 2017-01-07 NOTE — Progress Notes (Signed)
[facility] Note  Received and returned a call from spouse [name] requesting caregiver support. We plan to meet on Friday 1/11 during pt's tx to continue conversation.   [place], [place], [facility] Pager [phone] Voicemail [phone]

## 2017-01-08 ENCOUNTER — Encounter: Payer: Self-pay | Admitting: General Practice

## 2017-01-08 ENCOUNTER — Encounter (HOSPITAL_COMMUNITY): Payer: Self-pay | Admitting: Emergency Medicine

## 2017-01-08 ENCOUNTER — Emergency Department (HOSPITAL_COMMUNITY)
Admission: EM | Admit: 2017-01-08 | Discharge: 2017-01-08 | Disposition: A | Discharge status: Home / Self Care | Payer: PPO | Attending: Emergency Medicine | Admitting: Emergency Medicine

## 2017-01-08 DIAGNOSIS — Z951 Presence of aortocoronary bypass graft: Secondary | ICD-10-CM | POA: Diagnosis not present

## 2017-01-08 DIAGNOSIS — Z7902 Long term (current) use of antithrombotics/antiplatelets: Secondary | ICD-10-CM | POA: Diagnosis not present

## 2017-01-08 DIAGNOSIS — I10 Essential (primary) hypertension: Secondary | ICD-10-CM | POA: Insufficient documentation

## 2017-01-08 DIAGNOSIS — Z79899 Other long term (current) drug therapy: Secondary | ICD-10-CM | POA: Insufficient documentation

## 2017-01-08 DIAGNOSIS — E119 Type 2 diabetes mellitus without complications: Secondary | ICD-10-CM | POA: Insufficient documentation

## 2017-01-08 DIAGNOSIS — Z7984 Long term (current) use of oral hypoglycemic drugs: Secondary | ICD-10-CM | POA: Insufficient documentation

## 2017-01-08 DIAGNOSIS — K9423 Gastrostomy malfunction: Secondary | ICD-10-CM

## 2017-01-08 DIAGNOSIS — I2510 Atherosclerotic heart disease of native coronary artery without angina pectoris: Secondary | ICD-10-CM | POA: Diagnosis not present

## 2017-01-08 DIAGNOSIS — Z87891 Personal history of nicotine dependence: Secondary | ICD-10-CM | POA: Diagnosis not present

## 2017-01-08 NOTE — ED Notes (Signed)
Provider at bedside. Will obtain vital signs after.

## 2017-01-08 NOTE — Discharge Instructions (Signed)
Follow-up with your oncologist tomorrow to have your G-tube evaluated.

## 2017-01-08 NOTE — ED Provider Notes (Signed)
[facility] DEPT Provider Note   CSN: [id number] Arrival date & time: 01/08/17  2135     History   Chief Complaint Chief Complaint  Patient presents with  . G-tube Leaking    HPI Andres Smith is a 77 y.o. male.  Patient is a 77 year old male with extensive past medical history.  Most relevant to tonight's visit is a history of oral cancer receiving chemotherapy and radiation.  He has a G-tube in place which has been leaking intermittently for the past several weeks.  It leaked significantly more this evening and there was a slight amount of blood in the leakage.  The patient denies any abdominal pain, fevers, or chills.  He feels as though the tube is working appropriately.  He did use it earlier this evening without any problems.   The history is provided by the patient.    Past Medical History:  Diagnosis Date  . Ankylosing spondylitis ([place])   . Blood transfusion    hx of transfusion without reaction  . Bradycardia   . Cancer ([place]) 03/2016   oral  . Cholelithiasis   . Coronary artery disease    a. s/p CABG x 6 in 1997 (LIMA->LAD, VG->RI ->OM1->OM2, VG->PDA->PLV // b. 05/2011 Cath:  patent grafs, native prox rca and d2 dzs  ->med rx. // c. Myoview 1/18: not gated, large inferolateral scar, no ischemia; Intermediate Risk (IL scar old - on prior studies >> med rx)  . Crohn's colitis ([place]) 12/01/1997  . Diabetes mellitus   . Dysphagia   . Fatty liver   . GERD (gastroesophageal reflux disease)   . Heart block   . Hiatal hernia 12/03/2008  . History of radiation therapy 05/23/16- 07/09/16   Left Cheek/ 60 Gy in 30 fractions to gross disease, 63 Gy in 35 fractions to high risk nodal echelons, and 56 Gy in 35 fractions to intermedicate risk nodal echelons.   . History of skin cancer   . Hx of echocardiogram    Echo 5/14:  Mild LVH, EF 55-60%, NL diast fxn, mild LAE   . Hyperlipidemia   . Hypertension   . Kidney stones   . Neck pain   . Pneumonia      hx of PNA  . 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 Stroke ([place]) 11/2016  . 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 PEG tube malfunction ([place]) 12/21/2016  . Deficiency anemia 12/17/2016  . Cachexia ([place]) 12/17/2016  . Acute cardioembolic stroke ([place]) 67/67/2094  . Paroxysmal atrial fibrillation ([place]) 11/29/2016  . Stroke (cerebrum) ([place]) 11/28/2016  . Cancer associated pain 10/23/2016  . Multiple pulmonary nodules 10/10/2016  . Goals of care, counseling/discussion 10/10/2016  . Foot drop, right 08/23/2016  . Acquired pancytopenia ([place]) 06/14/2016  . 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 B12 deficiency anemia 08/03/2013  . Ulcerative colitis ([place]) 01/17/2010  . Occlusion and stenosis of carotid artery without mention of cerebral infarction 09/20/2009  . GERD 12/02/2008  . Type II diabetes mellitus with manifestations ([place]) 04/27/2008  . Hyperlipidemia with target LDL less than 70 12/04/2007  . Essential hypertension, benign 12/04/2007  . Ankylosing spondylitis ([place]) 12/04/2007  . Coronary atherosclerosis of native coronary artery 12/04/2007  . 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 Dysphagia 05/06/2007    Past Surgical History:  Procedure Laterality Date  . cancer removal  2014   removed from  left outter leg  . COLONOSCOPY  2012  . CORONARY ARTERY BYPASS GRAFT  04/19/1995   x7  . EXCISION ORAL TUMOR Left    left jaw and lymph node  . IR FLUORO GUIDE PORT INSERTION RIGHT  05/21/2016  . IR GASTROSTOMY TUBE MOD SED  05/21/2016  . IR US GUIDE VASC ACCESS RIGHT  05/21/2016  . LEFT HEART CATHETERIZATION WITH CORONARY/GRAFT ANGIOGRAM N/A 05/24/2011   Procedure: LEFT HEART CATHETERIZATION WITH Beatrix Fetters;  Surgeon: Larey Dresser, MD;  Location: [facility] CATH LAB;  Service: Cardiovascular;  Laterality: N/A;  . MOHS SURGERY     X 2 off chin and nose  .  TONSILLECTOMY AND ADENOIDECTOMY  1944  . TRIGGER FINGER RELEASE     x 2  . UMBILICAL HERNIA REPAIR         Home Medications    Prior to Admission medications   Medication Sig Start Date End Date Taking? Authorizing Provider  atorvastatin (LIPITOR) 80 MG tablet TAKE 1 TABLET AT 6PM. 12/04/16   Sherren Mocha, MD  balsalazide (COLAZAL) 750 MG capsule TAKE 3 CAPSULES 3 TIMES A DAY. OPEN CAPS & SPRINKLE ON APPLESAUCE OR DISSOLVE IN WATER & TAKE VIA TUBE. 12/24/16   Pyrtle, Lajuan Lines, MD  clopidogrel (PLAVIX) 75 MG tablet Take 1 tablet (75 mg total) by mouth daily. 11/30/16   Mary Sella, NP  cyanocobalamin (,VITAMIN B-12,) 1000 MCG/ML injection INJECT 1ML ONCE MONTHLY. 05/22/16   Janith Lima, MD  dronabinol (MARINOL) 2.5 MG capsule Take 1 capsule (2.5 mg total) by mouth 2 (two) times daily before a meal. 12/17/16   Janith Lima, MD  isosorbide mononitrate (ISMO,MONOKET) 20 MG tablet Crush 2 tablets (40 mg total) by mouth 2 (two) times daily at 10 AM and 5 PM. Patient taking differently: Take 40 mg by mouth 2 (two) times daily. Crush 2 tablets (40 mg total) by mouth 2 (two) times daily at 10 AM and 5 PM. 07/03/16   Sherren Mocha, MD  metFORMIN (GLUCOPHAGE) 500 MG tablet Place 1 tablet (500 mg total) into feeding tube 2 (two) times daily with a meal. 09/07/16   Janith Lima, MD  nitroGLYCERIN (NITROSTAT) 0.4 MG SL tablet Place 1 tablet (0.4 mg total) under the tongue every 5 (five) minutes as needed for chest pain. Max 3 doses. In no response call 911. 09/05/16   Sherren Mocha, MD  Nutritional Supplements (FEEDING SUPPLEMENT, OSMOLITE 1.5 CAL,) LIQD Give 1 and 1/2 cans Osmolite 1.5 via PEG 4 times daily with 60 mL free water before and after bolus feedings. In addition, drink or flush PEG with 240 mL free water TID. Send formula and supplies. 06/19/16   [name], MD  ondansetron (ZOFRAN) 8 MG tablet Take 1 tablet (8 mg total) by mouth every 8 (eight) hours as needed for nausea. 12/05/16    [name], MD  predniSONE (DELTASONE) 5 MG tablet TAKE 2 TABLETS IN THE MORNING WITH BREAKFAST. 12/22/16   [name], MD  promethazine (PHENERGAN) 25 MG tablet Take 1 tablet (25 mg total) by mouth every 6 (six) hours as needed for nausea. 12/05/16   [name], MD  traMADol (ULTRAM) 50 MG tablet Take 1 tablet (50 mg total) by mouth every 6 (six) hours as needed for moderate pain. 12/19/16   [name], MD    Family History Family History  Problem Relation Age of Onset  . Heart disease Father   . Heart disease Mother   . Arthritis Mother   .  Heart disease Unknown   . Crohn's disease Unknown   . Heart disease Sister   . Colon cancer Paternal Grandmother     Social History Social History   Tobacco Use  . Smoking status: Former Smoker    Last attempt to quit: 01/02/1983    Years since quitting: 34.0  . Smokeless tobacco: Former Systems developer    Types: Chew    Quit date: 01/01/2010  Substance Use Topics  . Alcohol use: Yes    Comment: tottie every night  . Drug use: No     Allergies   Patient has no known allergies.   Review of Systems Review of Systems  All other systems reviewed and are negative.    Physical Exam Updated Vital Signs BP 119/71 (BP Location: Left Arm)   Pulse 92   Temp 98.2 F (36.8 C) (Oral)   Resp 17   SpO2 98%   Physical Exam  Constitutional: He is oriented to person, place, and time. He appears well-developed and well-nourished. No distress.  HENT:  Head: Normocephalic and atraumatic.  Neck: Normal range of motion. Neck supple.  Pulmonary/Chest: Effort normal.  Abdominal:  There is some leakage of gastric contents around the G-tube.  There is some inflammation to the adjacent skin, however no purulent discharge.  Neurological: He is alert and oriented to person, place, and time.  Skin: Skin is warm and dry. He is not diaphoretic.  Nursing note and vitals reviewed.    ED Treatments / Results  Labs (all labs ordered are listed, but only  abnormal results are displayed) Labs Reviewed - No data to display  EKG  EKG Interpretation None       Radiology No results found.  Procedures Procedures (including critical care time)  Medications Ordered in ED Medications - No data to display   Initial Impression / Assessment and Plan / ED Course  I have reviewed the triage vital signs and the nursing notes.  Pertinent labs & imaging results that were available during my care of the patient were reviewed by me and considered in my medical decision making (see chart for details).  The tube was flushed and appears to be functioning appropriately.  I was able to flush and aspirate without difficulty.  I considered changing the tube, however feel as though this may be better done during the day in case there are problems requiring further consultation.  The area will be cleaned and dressed and the patient is to follow-up with his oncologist tomorrow.  Final Clinical Impressions(s) / ED Diagnoses   Final diagnoses:  None    ED Discharge Orders    None       Veryl Speak, MD 01/08/17 2330

## 2017-01-08 NOTE — ED Notes (Signed)
Site around GT stoma red-cleaned with saline and new 4x4 dressings applied under GT around insertion site. Abd pad applied across GT. Patient's wife given supplies for home. Patient and wife aware to call Cancer center in the am.

## 2017-01-08 NOTE — Progress Notes (Signed)
[facility] Spiritual Care Note  Set an approximate meeting time with Andres Smith wife [name] for 1:45 pm Friday in my office while pt is finishing tx.   [employer], [place], [facility] Pager [phone] Voicemail [phone]

## 2017-01-08 NOTE — ED Triage Notes (Signed)
Pt presents with leaking G-tube with blood and gastric contents.  Pt denies any pain.  Redness noted under the G-tube stopper which looks like irritation.

## 2017-01-09 ENCOUNTER — Other Ambulatory Visit: Payer: Self-pay | Admitting: *Deleted

## 2017-01-09 ENCOUNTER — Telehealth: Payer: Self-pay | Admitting: *Deleted

## 2017-01-09 ENCOUNTER — Encounter (HOSPITAL_COMMUNITY): Payer: Self-pay | Admitting: Diagnostic Radiology

## 2017-01-09 ENCOUNTER — Ambulatory Visit (HOSPITAL_COMMUNITY)
Admission: RE | Admit: 2017-01-09 | Discharge: 2017-01-09 | Disposition: A | Discharge status: Home / Self Care | Payer: PPO | Source: Ambulatory Visit | Attending: Hematology and Oncology | Admitting: Hematology and Oncology

## 2017-01-09 DIAGNOSIS — C060 Malignant neoplasm of cheek mucosa: Secondary | ICD-10-CM | POA: Diagnosis not present

## 2017-01-09 DIAGNOSIS — K9423 Gastrostomy malfunction: Secondary | ICD-10-CM | POA: Diagnosis not present

## 2017-01-09 DIAGNOSIS — K9420 Gastrostomy complication, unspecified: Secondary | ICD-10-CM | POA: Diagnosis not present

## 2017-01-09 HISTORY — PX: IR RADIOLOGIST EVAL & MGMT: IMG5224

## 2017-01-09 NOTE — Telephone Encounter (Signed)
Order placed for IR Eval . Tiffany to call patient to schedule

## 2017-01-09 NOTE — Telephone Encounter (Signed)
Wife states they were in the ED last night. Bairon's feeding tube was leaking and bleeding.  RN left message for IR to see when they can get him in for evaluation.

## 2017-01-10 ENCOUNTER — Ambulatory Visit: Payer: PPO | Admitting: Nurse Practitioner

## 2017-01-11 ENCOUNTER — Inpatient Hospital Stay: Payer: PPO

## 2017-01-11 ENCOUNTER — Encounter: Payer: Self-pay | Admitting: General Practice

## 2017-01-11 ENCOUNTER — Inpatient Hospital Stay (HOSPITAL_BASED_OUTPATIENT_CLINIC_OR_DEPARTMENT_OTHER): Payer: PPO | Admitting: Hematology and Oncology

## 2017-01-11 ENCOUNTER — Encounter: Payer: PPO | Admitting: Nutrition

## 2017-01-11 ENCOUNTER — Other Ambulatory Visit: Payer: Self-pay | Admitting: Hematology and Oncology

## 2017-01-11 ENCOUNTER — Encounter: Payer: Self-pay | Admitting: Hematology and Oncology

## 2017-01-11 DIAGNOSIS — C060 Malignant neoplasm of cheek mucosa: Secondary | ICD-10-CM

## 2017-01-11 DIAGNOSIS — K5010 Crohn's disease of large intestine without complications: Secondary | ICD-10-CM | POA: Diagnosis not present

## 2017-01-11 DIAGNOSIS — R630 Anorexia: Secondary | ICD-10-CM | POA: Diagnosis not present

## 2017-01-11 DIAGNOSIS — G893 Neoplasm related pain (acute) (chronic): Secondary | ICD-10-CM

## 2017-01-11 DIAGNOSIS — D61818 Other pancytopenia: Secondary | ICD-10-CM | POA: Diagnosis not present

## 2017-01-11 DIAGNOSIS — E860 Dehydration: Secondary | ICD-10-CM | POA: Diagnosis not present

## 2017-01-11 DIAGNOSIS — K9423 Gastrostomy malfunction: Secondary | ICD-10-CM | POA: Diagnosis not present

## 2017-01-11 DIAGNOSIS — R1310 Dysphagia, unspecified: Secondary | ICD-10-CM

## 2017-01-11 DIAGNOSIS — R918 Other nonspecific abnormal finding of lung field: Secondary | ICD-10-CM

## 2017-01-11 DIAGNOSIS — L989 Disorder of the skin and subcutaneous tissue, unspecified: Secondary | ICD-10-CM | POA: Diagnosis not present

## 2017-01-11 DIAGNOSIS — R634 Abnormal weight loss: Secondary | ICD-10-CM

## 2017-01-11 DIAGNOSIS — Z5111 Encounter for antineoplastic chemotherapy: Secondary | ICD-10-CM | POA: Diagnosis not present

## 2017-01-11 LAB — COMPREHENSIVE METABOLIC PANEL
ALBUMIN: 2.9 g/dL — AB (ref 3.5–5.0)
ALK PHOS: 70 U/L (ref 40–150)
ALT: 8 U/L (ref 0–55)
AST: 13 U/L (ref 5–34)
Anion gap: 7 (ref 3–11)
BILIRUBIN TOTAL: 0.5 mg/dL (ref 0.2–1.2)
BUN: 22 mg/dL (ref 7–26)
CALCIUM: 8.9 mg/dL (ref 8.4–10.4)
CO2: 30 mmol/L — ABNORMAL HIGH (ref 22–29)
CREATININE: 0.9 mg/dL (ref 0.70–1.30)
Chloride: 97 mmol/L — ABNORMAL LOW (ref 98–109)
GFR calc Af Amer: >60 mL/min (ref >60)
GFR calc non Af Amer: >60 mL/min (ref >60)
GLUCOSE: 323 mg/dL — AB (ref 70–140)
Potassium: 4.3 mmol/L (ref 3.5–5.1)
Sodium: 134 mmol/L — ABNORMAL LOW (ref 136–145)
TOTAL PROTEIN: 6 g/dL — AB (ref 6.4–8.3)

## 2017-01-11 LAB — CBC WITH DIFFERENTIAL/PLATELET
BASOS ABS: 0 10*3/uL (ref 0.0–0.1)
Basophils Relative: 0 %
Eosinophils Absolute: 0 10*3/uL (ref 0.0–0.5)
Eosinophils Relative: 1 %
HEMATOCRIT: 26.6 % — AB (ref 38.4–49.9)
HEMOGLOBIN: 8.9 g/dL — AB (ref 13.0–17.1)
LYMPHS PCT: 10 %
Lymphs Abs: 0.4 10*3/uL — ABNORMAL LOW (ref 0.9–3.3)
MCH: 32.3 pg (ref 27.2–33.4)
MCHC: 33.3 g/dL (ref 32.0–36.0)
MCV: 96.7 fL (ref 79.3–98.0)
MONOS PCT: 6 %
Monocytes Absolute: 0.2 10*3/uL (ref 0.1–0.9)
NEUTROS ABS: 3.2 10*3/uL (ref 1.5–6.5)
NEUTROS PCT: 83 %
Platelets: 114 10*3/uL — ABNORMAL LOW (ref 140–400)
RBC: 2.75 MIL/uL — ABNORMAL LOW (ref 4.20–5.82)
RDW: 17.4 % — ABNORMAL HIGH (ref 11.0–15.6)
WBC: 3.8 10*3/uL — ABNORMAL LOW (ref 4.0–10.3)

## 2017-01-11 MED ORDER — SODIUM CHLORIDE 0.9 % IV SOLN
45.0000 mg/m2 | Freq: Once | INTRAVENOUS | Status: AC
  Administered 2017-01-11: 84 mg via INTRAVENOUS
  Filled 2017-01-11: qty 14

## 2017-01-11 MED ORDER — TRAMADOL HCL 50 MG PO TABS: 50.0000 mg | ORAL_TABLET | Freq: Four times a day (QID) | ORAL | 0 refills | Status: DC | PRN

## 2017-01-11 MED ORDER — SODIUM CHLORIDE 0.9 % IV SOLN
Freq: Once | INTRAVENOUS | Status: AC
  Administered 2017-01-11: 12:00:00 via INTRAVENOUS

## 2017-01-11 MED ORDER — SODIUM CHLORIDE 0.9% FLUSH
10.0000 mL | INTRAVENOUS | Status: DC | PRN
  Administered 2017-01-11: 10 mL
  Filled 2017-01-11: qty 10

## 2017-01-11 MED ORDER — DIPHENHYDRAMINE HCL 50 MG/ML IJ SOLN
50.0000 mg | Freq: Once | INTRAMUSCULAR | Status: AC
  Administered 2017-01-11: 50 mg via INTRAVENOUS

## 2017-01-11 MED ORDER — SODIUM CHLORIDE 0.9 % IV SOLN
176.0000 mg | Freq: Once | INTRAVENOUS | Status: AC
  Administered 2017-01-11: 180 mg via INTRAVENOUS
  Filled 2017-01-11: qty 18

## 2017-01-11 MED ORDER — FAMOTIDINE IN NACL 20-0.9 MG/50ML-% IV SOLN
20.0000 mg | Freq: Once | INTRAVENOUS | Status: AC
  Administered 2017-01-11: 20 mg via INTRAVENOUS

## 2017-01-11 MED ORDER — PALONOSETRON HCL INJECTION 0.25 MG/5ML
INTRAVENOUS | Status: AC
  Filled 2017-01-11: qty 5

## 2017-01-11 MED ORDER — FAMOTIDINE IN NACL 20-0.9 MG/50ML-% IV SOLN
INTRAVENOUS | Status: AC
  Filled 2017-01-11: qty 50

## 2017-01-11 MED ORDER — SODIUM CHLORIDE 0.9% FLUSH
10.0000 mL | Freq: Once | INTRAVENOUS | Status: AC
  Administered 2017-01-11: 10 mL
  Filled 2017-01-11: qty 10

## 2017-01-11 MED ORDER — PALONOSETRON HCL INJECTION 0.25 MG/5ML
0.2500 mg | Freq: Once | INTRAVENOUS | Status: AC
  Administered 2017-01-11: 0.25 mg via INTRAVENOUS

## 2017-01-11 MED ORDER — DEXAMETHASONE SODIUM PHOSPHATE 100 MG/10ML IJ SOLN
20.0000 mg | Freq: Once | INTRAMUSCULAR | Status: AC
  Administered 2017-01-11: 20 mg via INTRAVENOUS
  Filled 2017-01-11: qty 2

## 2017-01-11 MED ORDER — DIPHENHYDRAMINE HCL 50 MG/ML IJ SOLN
INTRAMUSCULAR | Status: AC
  Filled 2017-01-11: qty 1

## 2017-01-11 MED ORDER — HEPARIN SOD (PORK) LOCK FLUSH 100 UNIT/ML IV SOLN
500.0000 [IU] | Freq: Once | INTRAVENOUS | Status: AC | PRN
  Administered 2017-01-11: 500 [IU]
  Filled 2017-01-11: qty 5

## 2017-01-11 MED ORDER — DEXAMETHASONE SODIUM PHOSPHATE 10 MG/ML IJ SOLN
INTRAMUSCULAR | Status: AC
  Filled 2017-01-11: qty 1

## 2017-01-11 NOTE — Assessment & Plan Note (Signed)
He tolerated chemotherapy well except for progressive weight loss and lack of appetite  we will proceed with treatment without dose adjustment I plan to repeat imaging study after 6 dose of treatment

## 2017-01-11 NOTE — Assessment & Plan Note (Signed)
Dysphagia is stable I encouraged him to increase oral intake as tolerated with the goal to wean him off feeding tube  He has poor appetite despite being on prednisone therapy He would like to try Marinol; prescribed by his primary care physician but not has been approved yet. I encouraged him to increase oral intake as tolerated He will continue close follow-up with dietitian

## 2017-01-11 NOTE — Patient Instructions (Signed)
   [facility] Discharge Instructions for Patients Receiving Chemotherapy  Today you received the following chemotherapy agents Taxol and Carboplatin   To help prevent nausea and vomiting after your treatment, we encourage you to take your nausea medication as directed.    If you develop nausea and vomiting that is not controlled by your nausea medication, call the clinic.   BELOW ARE SYMPTOMS THAT SHOULD BE REPORTED IMMEDIATELY:  *FEVER GREATER THAN 100.5 F  *CHILLS WITH OR WITHOUT FEVER  NAUSEA AND VOMITING THAT IS NOT CONTROLLED WITH YOUR NAUSEA MEDICATION  *UNUSUAL SHORTNESS OF BREATH  *UNUSUAL BRUISING OR BLEEDING  TENDERNESS IN MOUTH AND THROAT WITH OR WITHOUT PRESENCE OF ULCERS  *URINARY PROBLEMS  *BOWEL PROBLEMS  UNUSUAL RASH Items with * indicate a potential emergency and should be followed up as soon as possible.  Feel free to call the clinic should you have any questions or concerns. The clinic phone number is [phone].  Please show the CHEMO ALERT CARD at check-in to the Emergency Department and triage nurse.   

## 2017-01-11 NOTE — Assessment & Plan Note (Addendum)
He had mild worsening diarrhea, likely due to recent discontinuation of mercaptopurine I recommend increase hydration and to take Imodium as needed

## 2017-01-11 NOTE — Assessment & Plan Note (Signed)
He has acquired pancytopenia due to chemotherapy He is not symptomatic We will proceed with treatment without dose adjustment

## 2017-01-11 NOTE — Progress Notes (Signed)
[place] OFFICE PROGRESS NOTE  Patient Care Team: Janith Lima, MD as PCP - General (Internal Medicine) Sherren Mocha, MD as Consulting Physician (Cardiology) Jarome Matin, MD as Consulting Physician (Dermatology) Pyrtle, Lajuan Lines, MD as Consulting Physician (Gastroenterology) Katy Apo, MD as Consulting Physician (Ophthalmology) Frederik Schmidt, MD as Consulting Physician (Oral Surgery) [name], MD as Attending Physician (Radiation Oncology) Leota Sauers, RN as Oncology Nurse Navigator Karie Mainland, RD as Dietitian (Nutrition) Dannielle Karvonen, RN as [place] Management  SUMMARY OF ONCOLOGIC HISTORY:   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 Tumor measures 0.9 cm in greatest dimension. Tumor extends to the inked deep tissue edge. Depth of invasion: 4 mm (in this material) See note.  Note: an immunostains for P16 was performed at an outside hospital and provided for [place] to review; p16 immunostain is negative. Per outside report, In situ hybridization for high-risk HPV types showed no evidence of transcriptionally active high-risk HPV types.      03/21/2016 Initial Diagnosis    Salient findings:  -EAC's clear -Lesion within left buccal mucosa (pictured below) with about 10mm depth. Width is about 2/3 of the left buccal mucosal surface area by palpation. Extends to the oral commissure.  -Tongue soft and mobile without mucosal lesions -Neck soft with no palpable neck masses -Right handed -Right hand is pink and warm with Allen's test negative (normal)  -Left forearm very sun damaged with multiple treated areas from precancerous or cancerous areas. Right forearm not as dramatic, presumably from sun exposure while driving      7/56/4332 Surgery    RESECTION OF ORAL BUCCAL MUCOSA SELECTIVE NECK  DISSECTION FREE FLAP RADIAL FOREARM PR SPLIT [place] <100 SQCM [15100] (SPLIT THICKNESS SKIN GRAFT LEG        04/13/2016 Pathology Results    A.LEFT ORAL COMMISSURE INFERIOR, EXCISION: No malignancy identified.  B.LEFT ORAL COMMISSURE SUPERIOR, EXCISION: No malignancy identified.  C.LEFT DEEP ORAL COMMISSURE, EXCISION: No malignancy identified.  D.RIGHT POSTERIOR BUCCAL INFERIOR, EXCISION: No malignancy identified.  E.RIGHT POSTERIOR BUCCAL SUPERIOR, EXCISION: No malignancy identified.  F.ORAL CAVITY, LEFT BUCCAL, EXCISION:  Invasive squamous cell carcinoma, moderately differentiated,  keratinizing. Tumor size:2.2 cm. Tumor depth of invasion:0.8 cm. Perineural invasion identified. Margins negative for malignancy. Pathologic stage:pT2 pN2a. See tumor protocol summary below.  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 Tumor deposit size:1.1 cm. Extranodal extension present. Benign salivary gland tissue.  SURGICAL PATHOLOGY CANCER CASE SUMMARY (AJCC 8TH EDITION) LIP AND ORAL CAVITY CAP Protocol posting date: June, 2017 Version: LipOralCavity 4.0.0.0  PROCEDURE: Wide resection of buccal mucosa TUMOR SITE:Oral:Buccal mucosa TUMOR LATERALITY: Left TUMOR FOCALITY: Unifocal TUMOR SIZE:Greatest dimension: 2.2 cm TUMOR DEPTH OF INVASION (DOI):8 mm HISTOLOGIC TYPE:Squamous cell carcinoma, conventional HISTOLOGIC GRADE:G2: Moderately differentiated SPECIMEN MARGINS:Uninvolved by invasive carcinoma  DISTANCE FROM CLOSEST MARGIN:1 mm  SPECIFY MARGIN(S):Deep LYMPHOVASCULAR INVASION:Present PERINEURAL INVASION: Present REGIONAL LYMPH NODES: NUMBER OF LYMPH NODES INVOLVED: 1 NUMBER OF LYMPH NODES EXAMINED: 4 LATERALITY OF LYMPH NODES INVOLVED:  Ipsilateral SIZE OF LARGEST METASTATIC DEPOSIT:1.1 cm EXTRANODAL EXTENSION:Present PATHOLOGIC STAGE CLASSIFICATION (pTNM, AJCC 8th Ed): pT2 pN2a pT2: umor >2 cm but <=4 cm and <=10 mm DOI pN2a:Metastasis in a single ipsilateral lymph node, 3 cm or  smaller in greatest dimension and ENE(+) BIOMARKERS:performed on prior outside biopsy [facility] review case [id number], outside case [id number]) P16 (Immunohistochemistry):Negative HPV (high-risk  types by in situ hybridization):Reported as negative      05/11/2016 Imaging    1. 4 mm left lower lobe pulmonary nodule. Attention on follow-up imaging recommended as metastatic disease not excluded. 2. 2.8 cm left thyroid nodule. Thyroid ultrasound recommended to further evaluate. 3. Bilateral renal cysts. 4. Coronary artery and thoracoabdominal aortic atherosclerosis. 5. Ankylosing spondylitis.        05/11/2016 Imaging    CT neck: Surgical clips in the region of the left buccal mucosa, presumably at the previous primary site. Mild scarring in that region measuring about 8 mm. Cannot assess for residual or recurrent disease in that location, but this may simply be scarring. Previous left submandibular resection and left neck node dissection. No abnormal nodes presently. Multinodular goiter. Ankylosing spondylitis.  C1-2 remains a mobile segment.      05/21/2016 Procedure    1. Successful placement of a right internal jugular approach power injectable Port-A-Cath. The Port a catheter is ready for immediate use. 2. Successful fluoroscopic insertion of a 20-French pull-through gastrostomy tube. The gastrostomy may be used immediately for medication administration and may be utilized in 24 hrs for the initiation of feeds.      05/23/2016 - 07/09/2016 Radiation Therapy    Site/dose:     Left Cheek and bilateral neck / 60 Gy in 30 fractions to gross disease, 54 Gy in 30 fractions to intermediate risk nodal echelons Beams/energy:    IMRT / 6 MV photons       05/25/2016 - 06/15/2016 Chemotherapy    He received weekly reduced dose cisplatin       06/21/2016 Adverse Reaction    Cycle 5 is delayed due to pancytopenia      06/26/2016 - 06/29/2016 Hospital Admission    He was admitted for management of neutropenic fever      08/27/2016 Imaging    1. Negative for pneumothorax or pleural effusion 2. Acute right eighth rib fracture. Questionable fracture deformity of the right ninth rib.      09/04/2016 Imaging    Left-sided rib fractures, without pneumothorax or pleural fluid      09/27/2016 Imaging    CT chest 1. Multiple (at least 6) scattered solid pulmonary nodules throughout both lungs, all new since 05/11/2016 chest CT and increased since 08/27/2016 chest CT, compatible with growing pulmonary metastases. 2. No thoracic adenopathy. 3. Additional findings include stable multinodular goiter and cholelithiasis.  Aortic Atherosclerosis (ICD10-I70.0).      09/27/2016 Imaging    1. Unchanged appearance of the left buccal mucosa resection site without evidence of disease recurrence or progression. 2. Unchanged cervical ankylosis. 3. Mild carotid and aortic atherosclerosis (ICD10-I70.0).      10/16/2016 Imaging    No evidence of active pulmonary disease.  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       11/26/2016 PET scan    1. Bilateral hypermetabolic enlarging nodules consistent with metastatic pulmonary nodules. 2. 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 Second metastatic lesion in the RIGHT iliac wing. 3. No evidence of local recurrence within the oral cavity      11/28/2016 - 11/30/2016 Hospital Admission    He was admitted for acute stroke      11/29/2016 Imaging    MRI brain: Large acute LEFT PCA territory infarct affecting much of the LEFT occipital lobe. No superimposed hemorrhage  is evident      12/05/2016 -  Chemotherapy  He received weekly chemo with carboplatin and Taxol       INTERVAL HISTORY: Please see below for problem oriented charting. He returns with his wife for further follow-up He had recent feeding tube malfunction, resolved with conservative management He denies peripheral neuropathy No recent nausea vomiting Pain is well controlled He had mild loose bowel movement He has lost some weight.  REVIEW OF SYSTEMS:   Constitutional: Denies fevers, chills  Eyes: Denies blurriness of vision Ears, nose, mouth, throat, and face: Denies mucositis or sore throat Respiratory: Denies cough, dyspnea or wheezes Cardiovascular: Denies palpitation, chest discomfort or lower extremity swelling Skin: Denies abnormal skin rashes Lymphatics: Denies new lymphadenopathy or easy bruising Neurological:Denies numbness, tingling or new weaknesses Behavioral/Psych: Mood is stable, no new changes  All other systems were reviewed with the patient and are negative.  I have reviewed the past medical history, past surgical history, social history and family history with the patient and they are unchanged from previous note.  ALLERGIES:  has No Known Allergies.  MEDICATIONS:  Current Outpatient Medications  Medication Sig Dispense Refill  . atorvastatin (LIPITOR) 80 MG tablet TAKE 1 TABLET AT 6PM. 90 tablet 2  . balsalazide (COLAZAL) 750 MG capsule TAKE 3 CAPSULES 3 TIMES A DAY. OPEN CAPS & SPRINKLE ON APPLESAUCE OR DISSOLVE IN WATER & TAKE VIA TUBE. 270 capsule 2  . clopidogrel (PLAVIX) 75 MG tablet Take 1 tablet (75 mg total) by mouth daily. 30 tablet 1  . cyanocobalamin (,VITAMIN B-12,) 1000 MCG/ML injection INJECT 1ML ONCE MONTHLY. 3 mL 1  . dronabinol (MARINOL) 2.5 MG capsule Take 1 capsule (2.5 mg total) by mouth 2 (two) times daily before a meal. 60 capsule 3  . isosorbide mononitrate (ISMO,MONOKET) 20 MG tablet Crush 2 tablets (40 mg total) by mouth 2 (two)  times daily at 10 AM and 5 PM. (Patient taking differently: Take 40 mg by mouth 2 (two) times daily. Crush 2 tablets (40 mg total) by mouth 2 (two) times daily at 10 AM and 5 PM.) 120 tablet 11  . metFORMIN (GLUCOPHAGE) 500 MG tablet Place 1 tablet (500 mg total) into feeding tube 2 (two) times daily with a meal. 180 tablet 1  . nitroGLYCERIN (NITROSTAT) 0.4 MG SL tablet Place 1 tablet (0.4 mg total) under the tongue every 5 (five) minutes as needed for chest pain. Max 3 doses. In no response call 911. 25 tablet 5  . Nutritional Supplements (FEEDING SUPPLEMENT, OSMOLITE 1.5 CAL,) LIQD Give 1 and 1/2 cans Osmolite 1.5 via PEG 4 times daily with 60 mL free water before and after bolus feedings. In addition, drink or flush PEG with 240 mL free water TID. Send formula and supplies. 6 Bottle 0  . ondansetron (ZOFRAN) 8 MG tablet Take 1 tablet (8 mg total) by mouth every 8 (eight) hours as needed for nausea. 30 tablet 3  . predniSONE (DELTASONE) 5 MG tablet TAKE 2 TABLETS IN THE MORNING WITH BREAKFAST. 60 tablet 0  . promethazine (PHENERGAN) 25 MG tablet Take 1 tablet (25 mg total) by mouth every 6 (six) hours as needed for nausea. 30 tablet 3  . traMADol (ULTRAM) 50 MG tablet Take 1 tablet (50 mg total) by mouth every 6 (six) hours as needed for moderate pain. 90 tablet 0   No current facility-administered medications for this visit.    Facility-Administered Medications Ordered in Other Visits  Medication Dose Route Frequency Provider Last Rate Last Dose  . CARBOplatin (PARAPLATIN) 180 mg in sodium chloride  0.9 % 250 mL chemo infusion  180 mg Intravenous Once Alvy Bimler, Amram Maya, MD      . heparin lock flush 100 unit/mL  500 Units Intracatheter Once PRN Alvy Bimler, Batu Cassin, MD      . PACLitaxel (TAXOL) 84 mg in sodium chloride 0.9 % 250 mL chemo infusion (</= 80mg /m2)  45 mg/m2 (Treatment Plan Recorded) Intravenous Once Bradee Common, MD      . sodium chloride flush (NS) 0.9 % injection 10 mL  10 mL Intracatheter PRN  Alvy Bimler, Yuma Blucher, MD        PHYSICAL EXAMINATION: ECOG PERFORMANCE STATUS: 2 - Symptomatic, <50% confined to bed  Vitals:   01/11/17 1051  BP: 125/67  Pulse: (!) 55  Resp: 18  Temp: (!) 97.5 F (36.4 C)  SpO2: 97%   Filed Weights   01/11/17 1051  Weight: 158 lb 11.2 oz (72 kg)    GENERAL:alert, no distress and comfortable SKIN: skin color, texture, turgor are normal, no rashes or significant lesions EYES: normal, Conjunctiva are pink and non-injected, sclera clear OROPHARYNX:no exudate, no erythema and lips, buccal mucosa, and tongue normal  NECK: supple, thyroid normal size, non-tender, without nodularity LYMPH:  no palpable lymphadenopathy in the cervical, axillary or inguinal LUNGS: clear to auscultation and percussion with normal breathing effort HEART: regular rate & rhythm and no murmurs and no lower extremity edema ABDOMEN:abdomen soft, non-tender and normal bowel sounds Musculoskeletal:no cyanosis of digits and no clubbing  NEURO: alert & oriented x 3 with fluent speech, no focal motor/sensory deficits  LABORATORY DATA:  I have reviewed the data as listed    Component Value Date/Time   NA 134 (L) 01/11/2017 0947   NA 134 (L) 01/04/2017 1114   K 4.3 01/11/2017 0947   K 4.5 01/04/2017 1114   CL 97 (L) 01/11/2017 0947   CO2 30 (H) 01/11/2017 0947   CO2 30 (H) 01/04/2017 1114   GLUCOSE 323 (H) 01/11/2017 0947   GLUCOSE 253 (H) 01/04/2017 1114   GLUCOSE 98 12/12/2005 0729   BUN 22 01/11/2017 0947   BUN 17.4 01/04/2017 1114   CREATININE 0.90 01/11/2017 0947   CREATININE 0.8 01/04/2017 1114   CALCIUM 8.9 01/11/2017 0947   CALCIUM 9.1 01/04/2017 1114   PROT 6.0 (L) 01/11/2017 0947   PROT 6.2 (L) 01/04/2017 1114   ALBUMIN 2.9 (L) 01/11/2017 0947   ALBUMIN 3.2 (L) 01/04/2017 1114   AST 13 01/11/2017 0947   AST 14 01/04/2017 1114   ALT 8 01/11/2017 0947   ALT 12 01/04/2017 1114   ALKPHOS 70 01/11/2017 0947   ALKPHOS 73 01/04/2017 1114   BILITOT 0.5 01/11/2017  0947   BILITOT 0.79 01/04/2017 1114   GFRNONAA >60 01/11/2017 0947   GFRAA >60 01/11/2017 0947    No results found for: SPEP, UPEP  Lab Results  Component Value Date   WBC 3.8 (L) 01/11/2017   NEUTROABS 3.2 01/11/2017   HGB 8.9 (L) 01/11/2017   HCT 26.6 (L) 01/11/2017   MCV 96.7 01/11/2017   PLT 114 (L) 01/11/2017      Chemistry      Component Value Date/Time   NA 134 (L) 01/11/2017 0947   NA 134 (L) 01/04/2017 1114   K 4.3 01/11/2017 0947   K 4.5 01/04/2017 1114   CL 97 (L) 01/11/2017 0947   CO2 30 (H) 01/11/2017 0947   CO2 30 (H) 01/04/2017 1114   BUN 22 01/11/2017 0947   BUN 17.4 01/04/2017 1114   CREATININE 0.90 01/11/2017 0947  CREATININE 0.8 01/04/2017 1114      Component Value Date/Time   CALCIUM 8.9 01/11/2017 0947   CALCIUM 9.1 01/04/2017 1114   ALKPHOS 70 01/11/2017 0947   ALKPHOS 73 01/04/2017 1114   AST 13 01/11/2017 0947   AST 14 01/04/2017 1114   ALT 8 01/11/2017 0947   ALT 12 01/04/2017 1114   BILITOT 0.5 01/11/2017 0947   BILITOT 0.79 01/04/2017 1114       RADIOGRAPHIC STUDIES: I have personally reviewed the radiological images as listed and agreed with the findings in the report. 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 Percutaneous gastrostomy tube was placed on 05/21/2016 by Dr. Pascal Lux. Patient is supplementing his diet with Osmolite through the gastrostomy tube. Patient reports recent leakage around the tube with some redness and bleeding around the tube. Patient was seen in the emergency department last evening. He has been trying to increase the volume he puts through the gastrostomy tube in order to gain weight. Although the patient is able to eat by mouth, his appetite is poor. Patient is using the gastrostomy tube for bolus feeding and not  using a continuous infusion. REVIEW OF SYSTEMS: Poor appetite PHYSICAL EXAMINATION: 20 French gastrostomy tube is positioned just below the left anterior rib cage. Mild redness and irritation around the tube. The gastrostomy tube was flushed with 30 mL of saline and there is no evidence of leakage. Saline easily flushed through the tube. 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 The gastrostomy tube is not leaking during today's examination. There is mild redness and irritation around the tube which could be compatible with recent leakage. Patient is not a good historian but it sounds like he has been increasing his tube feedings. Increasing tube feeds appears to be coinciding with the leakage. We discussed how he may need to slow down the tube feeds with increased volume. We also discussed how he should remain upright or possibly on his right side during and after the tube feeds. We also discussed trying keep the skin around the tube clean and dry. A thin gauze was placed between the skin and the bumper. Patient and family will contact us if he continues to have problems with the gastrostomy tube. Electronically Signed   By: Markus Daft M.D.   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 I encouraged him to increase oral intake as tolerated He will continue close follow-up with dietitian  [facility] [place]  He had mild worsening diarrhea, likely due to recent discontinuation of mercaptopurine I recommend increase hydration and to take Imodium as needed  Weight loss He has recent weight loss due to poor appetite and dysgeusia I recommend increase oral intake as tolerated and he will continue close follow-up with dietitian I recommend he use his feeding tube for nutritional supplement   No orders of the defined types were placed in this encounter.  All questions were answered. The patient knows to call the clinic with any problems, questions or concerns. No barriers to learning was detected. I spent 25 minutes counseling the patient face to face. The total time spent in the appointment was 30 minutes and more than 50% was on counseling and review of test results     [name], MD 01/11/2017 12:38 PM

## 2017-01-11 NOTE — Progress Notes (Signed)
[facility] Spiritual Care Note  Met with wife [name] for ca 1 hour as planned for caregiver support. We plan to continue this relationship as needed, starting with another session next Friday during Mr Kuklinski's tx.   [place], [place], [facility] Pager [phone] Voicemail [phone]

## 2017-01-11 NOTE — Assessment & Plan Note (Signed)
He has recent weight loss due to poor appetite and dysgeusia I recommend increase oral intake as tolerated and he will continue close follow-up with dietitian I recommend he use his feeding tube for nutritional supplement

## 2017-01-11 NOTE — Assessment & Plan Note (Signed)
The patient has intermittent rib pain Low-dose tramadol appears to control his pain well For now, we will continue tramadol as needed for pain

## 2017-01-14 ENCOUNTER — Telehealth: Payer: Self-pay

## 2017-01-14 ENCOUNTER — Ambulatory Visit (HOSPITAL_COMMUNITY)
Admission: RE | Admit: 2017-01-14 | Discharge: 2017-01-14 | Disposition: A | Discharge status: Home / Self Care | Payer: PPO | Source: Ambulatory Visit | Attending: Hematology and Oncology | Admitting: Hematology and Oncology

## 2017-01-14 ENCOUNTER — Telehealth (HOSPITAL_COMMUNITY): Payer: Self-pay | Admitting: Radiology

## 2017-01-14 ENCOUNTER — Encounter (HOSPITAL_COMMUNITY): Payer: Self-pay | Admitting: Diagnostic Radiology

## 2017-01-14 ENCOUNTER — Other Ambulatory Visit: Payer: Self-pay | Admitting: Hematology and Oncology

## 2017-01-14 DIAGNOSIS — I951 Orthostatic hypotension: Secondary | ICD-10-CM | POA: Diagnosis present

## 2017-01-14 DIAGNOSIS — K5010 Crohn's disease of large intestine without complications: Secondary | ICD-10-CM | POA: Diagnosis present

## 2017-01-14 DIAGNOSIS — C7951 Secondary malignant neoplasm of bone: Secondary | ICD-10-CM | POA: Diagnosis present

## 2017-01-14 DIAGNOSIS — D61818 Other pancytopenia: Secondary | ICD-10-CM | POA: Diagnosis not present

## 2017-01-14 DIAGNOSIS — R627 Adult failure to thrive: Secondary | ICD-10-CM | POA: Diagnosis present

## 2017-01-14 DIAGNOSIS — R1310 Dysphagia, unspecified: Secondary | ICD-10-CM | POA: Diagnosis present

## 2017-01-14 DIAGNOSIS — D61810 Antineoplastic chemotherapy induced pancytopenia: Secondary | ICD-10-CM | POA: Diagnosis present

## 2017-01-14 DIAGNOSIS — B370 Candidal stomatitis: Secondary | ICD-10-CM | POA: Diagnosis present

## 2017-01-14 DIAGNOSIS — I482 Chronic atrial fibrillation: Secondary | ICD-10-CM | POA: Diagnosis not present

## 2017-01-14 DIAGNOSIS — Z7902 Long term (current) use of antithrombotics/antiplatelets: Secondary | ICD-10-CM | POA: Insufficient documentation

## 2017-01-14 DIAGNOSIS — Z79899 Other long term (current) drug therapy: Secondary | ICD-10-CM | POA: Insufficient documentation

## 2017-01-14 DIAGNOSIS — E876 Hypokalemia: Secondary | ICD-10-CM | POA: Diagnosis not present

## 2017-01-14 DIAGNOSIS — Z794 Long term (current) use of insulin: Secondary | ICD-10-CM

## 2017-01-14 DIAGNOSIS — R531 Weakness: Secondary | ICD-10-CM | POA: Diagnosis not present

## 2017-01-14 DIAGNOSIS — I480 Paroxysmal atrial fibrillation: Secondary | ICD-10-CM | POA: Diagnosis present

## 2017-01-14 DIAGNOSIS — Z87442 Personal history of urinary calculi: Secondary | ICD-10-CM

## 2017-01-14 DIAGNOSIS — Z800 Family history of malignant neoplasm of digestive organs: Secondary | ICD-10-CM

## 2017-01-14 DIAGNOSIS — C7889 Secondary malignant neoplasm of other digestive organs: Secondary | ICD-10-CM | POA: Diagnosis present

## 2017-01-14 DIAGNOSIS — D6481 Anemia due to antineoplastic chemotherapy: Secondary | ICD-10-CM | POA: Diagnosis present

## 2017-01-14 DIAGNOSIS — G893 Neoplasm related pain (acute) (chronic): Secondary | ICD-10-CM | POA: Diagnosis not present

## 2017-01-14 DIAGNOSIS — K9423 Gastrostomy malfunction: Principal | ICD-10-CM | POA: Diagnosis present

## 2017-01-14 DIAGNOSIS — Z87891 Personal history of nicotine dependence: Secondary | ICD-10-CM

## 2017-01-14 DIAGNOSIS — L89159 Pressure ulcer of sacral region, unspecified stage: Secondary | ICD-10-CM | POA: Diagnosis not present

## 2017-01-14 DIAGNOSIS — E785 Hyperlipidemia, unspecified: Secondary | ICD-10-CM | POA: Diagnosis present

## 2017-01-14 DIAGNOSIS — E43 Unspecified severe protein-calorie malnutrition: Secondary | ICD-10-CM | POA: Diagnosis present

## 2017-01-14 DIAGNOSIS — Z8673 Personal history of transient ischemic attack (TIA), and cerebral infarction without residual deficits: Secondary | ICD-10-CM

## 2017-01-14 DIAGNOSIS — I25810 Atherosclerosis of coronary artery bypass graft(s) without angina pectoris: Secondary | ICD-10-CM | POA: Diagnosis not present

## 2017-01-14 DIAGNOSIS — Z9089 Acquired absence of other organs: Secondary | ICD-10-CM

## 2017-01-14 DIAGNOSIS — Z7189 Other specified counseling: Secondary | ICD-10-CM | POA: Diagnosis not present

## 2017-01-14 DIAGNOSIS — I10 Essential (primary) hypertension: Secondary | ICD-10-CM | POA: Diagnosis present

## 2017-01-14 DIAGNOSIS — K219 Gastro-esophageal reflux disease without esophagitis: Secondary | ICD-10-CM | POA: Diagnosis present

## 2017-01-14 DIAGNOSIS — I2510 Atherosclerotic heart disease of native coronary artery without angina pectoris: Secondary | ICD-10-CM | POA: Diagnosis present

## 2017-01-14 DIAGNOSIS — Z923 Personal history of irradiation: Secondary | ICD-10-CM

## 2017-01-14 DIAGNOSIS — D696 Thrombocytopenia, unspecified: Secondary | ICD-10-CM | POA: Diagnosis not present

## 2017-01-14 DIAGNOSIS — C7800 Secondary malignant neoplasm of unspecified lung: Secondary | ICD-10-CM | POA: Diagnosis present

## 2017-01-14 DIAGNOSIS — Z951 Presence of aortocoronary bypass graft: Secondary | ICD-10-CM | POA: Diagnosis not present

## 2017-01-14 DIAGNOSIS — T451X5A Adverse effect of antineoplastic and immunosuppressive drugs, initial encounter: Secondary | ICD-10-CM | POA: Diagnosis present

## 2017-01-14 DIAGNOSIS — Z4589 Encounter for adjustment and management of other implanted devices: Secondary | ICD-10-CM | POA: Diagnosis not present

## 2017-01-14 DIAGNOSIS — Z7901 Long term (current) use of anticoagulants: Secondary | ICD-10-CM | POA: Diagnosis not present

## 2017-01-14 DIAGNOSIS — L8990 Pressure ulcer of unspecified site, unspecified stage: Secondary | ICD-10-CM | POA: Diagnosis not present

## 2017-01-14 DIAGNOSIS — E1165 Type 2 diabetes mellitus with hyperglycemia: Secondary | ICD-10-CM | POA: Diagnosis present

## 2017-01-14 DIAGNOSIS — M25551 Pain in right hip: Secondary | ICD-10-CM | POA: Diagnosis not present

## 2017-01-14 DIAGNOSIS — K8000 Calculus of gallbladder with acute cholecystitis without obstruction: Secondary | ICD-10-CM | POA: Diagnosis present

## 2017-01-14 DIAGNOSIS — R64 Cachexia: Secondary | ICD-10-CM | POA: Diagnosis not present

## 2017-01-14 DIAGNOSIS — C060 Malignant neoplasm of cheek mucosa: Secondary | ICD-10-CM

## 2017-01-14 DIAGNOSIS — L89151 Pressure ulcer of sacral region, stage 1: Secondary | ICD-10-CM | POA: Diagnosis present

## 2017-01-14 DIAGNOSIS — L989 Disorder of the skin and subcutaneous tissue, unspecified: Secondary | ICD-10-CM | POA: Diagnosis not present

## 2017-01-14 DIAGNOSIS — D6959 Other secondary thrombocytopenia: Secondary | ICD-10-CM | POA: Diagnosis present

## 2017-01-14 DIAGNOSIS — Z9221 Personal history of antineoplastic chemotherapy: Secondary | ICD-10-CM

## 2017-01-14 DIAGNOSIS — Z515 Encounter for palliative care: Secondary | ICD-10-CM

## 2017-01-14 DIAGNOSIS — Z8249 Family history of ischemic heart disease and other diseases of the circulatory system: Secondary | ICD-10-CM

## 2017-01-14 DIAGNOSIS — Z7984 Long term (current) use of oral hypoglycemic drugs: Secondary | ICD-10-CM

## 2017-01-14 DIAGNOSIS — E860 Dehydration: Secondary | ICD-10-CM | POA: Diagnosis present

## 2017-01-14 DIAGNOSIS — Y833 Surgical operation with formation of external stoma as the cause of abnormal reaction of the patient, or of later complication, without mention of misadventure at the time of the procedure: Secondary | ICD-10-CM | POA: Diagnosis present

## 2017-01-14 DIAGNOSIS — C069 Malignant neoplasm of mouth, unspecified: Secondary | ICD-10-CM

## 2017-01-14 DIAGNOSIS — Z7952 Long term (current) use of systemic steroids: Secondary | ICD-10-CM

## 2017-01-14 DIAGNOSIS — R4182 Altered mental status, unspecified: Secondary | ICD-10-CM | POA: Diagnosis not present

## 2017-01-14 DIAGNOSIS — K9420 Gastrostomy complication, unspecified: Secondary | ICD-10-CM | POA: Diagnosis not present

## 2017-01-14 DIAGNOSIS — Z66 Do not resuscitate: Secondary | ICD-10-CM | POA: Diagnosis present

## 2017-01-14 DIAGNOSIS — Z85828 Personal history of other malignant neoplasm of skin: Secondary | ICD-10-CM

## 2017-01-14 DIAGNOSIS — Z5111 Encounter for antineoplastic chemotherapy: Secondary | ICD-10-CM | POA: Diagnosis not present

## 2017-01-14 DIAGNOSIS — Z8261 Family history of arthritis: Secondary | ICD-10-CM

## 2017-01-14 DIAGNOSIS — J984 Other disorders of lung: Secondary | ICD-10-CM | POA: Diagnosis not present

## 2017-01-14 DIAGNOSIS — K5090 Crohn's disease, unspecified, without complications: Secondary | ICD-10-CM | POA: Diagnosis not present

## 2017-01-14 DIAGNOSIS — L03311 Cellulitis of abdominal wall: Secondary | ICD-10-CM | POA: Diagnosis present

## 2017-01-14 DIAGNOSIS — K8020 Calculus of gallbladder without cholecystitis without obstruction: Secondary | ICD-10-CM | POA: Diagnosis not present

## 2017-01-14 HISTORY — PX: IR REPLC GASTRO/COLONIC TUBE PERCUT W/FLUORO: IMG2333

## 2017-01-14 MED ORDER — IOPAMIDOL (ISOVUE-300) INJECTION 61%
50.0000 mL | Freq: Once | INTRAVENOUS | Status: AC | PRN
  Administered 2017-01-14: 40 mL

## 2017-01-14 MED ORDER — LORAZEPAM 1 MG PO TABS
2.0000 mg | ORAL_TABLET | Freq: Once | ORAL | Status: AC
  Administered 2017-01-14: 2 mg via ORAL
  Filled 2017-01-14: qty 1

## 2017-01-14 MED ORDER — IOPAMIDOL (ISOVUE-300) INJECTION 61%
INTRAVENOUS | Status: AC
  Administered 2017-01-14: 40 mL
  Filled 2017-01-14: qty 50

## 2017-01-14 MED ORDER — ZINC OXIDE 12.8 % EX OINT
TOPICAL_OINTMENT | CUTANEOUS | Status: DC | PRN
  Filled 2017-01-14: qty 56.7

## 2017-01-14 NOTE — Procedures (Signed)
G-tube is leaking and causing irritation of surrounding skin.  20 Fr tube was exchanged for 22 Fr tube.  Tube injection confirmed good placement in stomach and contrast easily drained into duodenum.  Plan for wound nurse to evaluate patient and advise.  Minimal blood loss around the tube and no immediate complication.

## 2017-01-14 NOTE — Telephone Encounter (Signed)
Called regarding after hours call about tube feeding leaking. Patient is IR getting tube feeding evaluated now. Was constipated, took miralax and has had x 2 bm's. Will continue pain medication for pain control. Instructed to call for questions.

## 2017-01-14 NOTE — Telephone Encounter (Signed)
Mrs Throgmorton called to inform Dr. Anselm Pancoast that Mr Rodino g-tube was still leaking even after being increased in size to a 56fr. Per Dr. Anselm Pancoast,  I told Mrs.Fasching that we could increase the tube size again or remove it completely. She is going to talk to Mr Poole and the cancer center staff. She will call back when they have made a decision about how to proceed.

## 2017-01-14 NOTE — Consult Note (Signed)
[facility] Nurse ostomy consult note Stoma type/location: Gtube site; left upper quad Gtube replaced for stoma erosion and large volume leakage around tube   Peristomal assessment: denudation circumferentially, but worse noted from 3-9 o'clock  Treatment options for stomal/peristomal skin: provided patient's wife with triple paste and demonstrated use for her. Affected skin is weeping. Output no leakage noted while WOC nurse in to assess Education provided:  Peristomal skin treatment education Tube stabilization with baby nipple discussed Provided patient's wife with additional mesh underwear that they cut like a tube top to place around his abdomen to lessen the movement of the tube and the dangling of the tube.  Provided my contact information to patient's wife.  Patient to be DC after my visit, patient is quite sleepy from medication. Patient's wife will wait till he is more awake to get up to wheel chair.    Re consult if needed, will not follow at this time. Thanks  Candus Braud [employer], RN,CWOCN, CNS, [place] [phone])

## 2017-01-14 NOTE — Progress Notes (Signed)
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  Percutaneous gastrostomy tube was placed on 05/21/2016 by Dr. Pascal Lux.  He is supplementing his diet with Osmolite through the gastrostomy tube.   He and his wife report copious leakage around the tube with worsening redness, soreness, and bleeding of the skin around the tube site.  He has been trying to increase the volume he puts through the gastrostomy tube in order to gain weight.  His wife states "everything just comes back out around it!"  Mr. Lomas is visibly nervous and upset. He requested "something for his anxiety". I will give sublingual Ativan.   Allergies: Patient has no known allergies.  Medications: Prior to Admission medications   Medication Sig Start Date End Date Taking? Authorizing Provider  atorvastatin (LIPITOR) 80 MG tablet TAKE 1 TABLET AT 6PM. 12/04/16   Sherren Mocha, MD  balsalazide (COLAZAL) 750 MG capsule TAKE 3 CAPSULES 3 TIMES A DAY. OPEN CAPS & SPRINKLE ON APPLESAUCE OR DISSOLVE IN WATER & TAKE VIA TUBE. 12/24/16   Pyrtle, Lajuan Lines, MD  clopidogrel (PLAVIX) 75 MG tablet Take 1 tablet (75 mg total) by mouth daily. 11/30/16   Mary Sella, NP  cyanocobalamin (,VITAMIN B-12,) 1000 MCG/ML injection INJECT 1ML ONCE MONTHLY. 05/22/16   Janith Lima, MD  dronabinol (MARINOL) 2.5 MG capsule Take 1 capsule (2.5 mg total) by mouth 2 (two) times daily before a meal. 12/17/16   Janith Lima, MD  isosorbide mononitrate (ISMO,MONOKET) 20 MG tablet Crush 2 tablets (40 mg total) by mouth 2 (two) times daily at 10 AM and 5 PM. Patient taking differently: Take 40 mg by mouth 2 (two) times daily. Crush 2 tablets (40 mg total) by mouth 2 (two) times daily at 10 AM and 5 PM. 07/03/16   Sherren Mocha, MD  metFORMIN (GLUCOPHAGE) 500 MG tablet Place 1 tablet (500 mg total) into  feeding tube 2 (two) times daily with a meal. 09/07/16   Janith Lima, MD  nitroGLYCERIN (NITROSTAT) 0.4 MG SL tablet Place 1 tablet (0.4 mg total) under the tongue every 5 (five) minutes as needed for chest pain. Max 3 doses. In no response call 911. 09/05/16   Sherren Mocha, MD  Nutritional Supplements (FEEDING SUPPLEMENT, OSMOLITE 1.5 CAL,) LIQD Give 1 and 1/2 cans Osmolite 1.5 via PEG 4 times daily with 60 mL free water before and after bolus feedings. In addition, drink or flush PEG with 240 mL free water TID. Send formula and supplies. 06/19/16   [name], MD  ondansetron (ZOFRAN) 8 MG tablet Take 1 tablet (8 mg total) by mouth every 8 (eight) hours as needed for nausea. 12/05/16   [name], MD  predniSONE (DELTASONE) 5 MG tablet TAKE 2 TABLETS IN THE MORNING WITH BREAKFAST. 12/22/16   [name], MD  promethazine (PHENERGAN) 25 MG tablet Take 1 tablet (25 mg total) by mouth every 6 (six) hours as needed for nausea. 12/05/16   [name], MD  traMADol (ULTRAM) 50 MG tablet Take 1 tablet (50 mg total) by mouth every 6 (six) hours as needed for moderate pain. 01/11/17   [name], MD     Vital Signs: There were no vitals taken for this visit.  Physical Exam  Abdominal:    Initial exam revealed the tube in good position with the bumper synched down to  skin properly.  The bumper was slid back on the tube to reveal a very dilated exit site with stomach contents actively leaking out.  Blue= tube Red = dilated exit site Pink = skin irritation c/w burn from stomach acid   Awake and alert Visibly upset and worried about the site.  Imaging: No results found.  Labs:  CBC: Recent Labs    12/12/16 0901 12/19/16 1054 01/04/17 1113 01/11/17 0947  WBC 4.7 2.8* 4.5 3.8*  HGB 10.1* 9.7* 9.7* 8.9*  HCT 30.3* 29.8* 29.7* 26.6*  PLT 217 175 100* 114*    COAGS: Recent Labs    05/21/16 0818 11/28/16 1333  INR 1.02 0.97  APTT 28 27    BMP: Recent Labs    08/27/16 1614  09/24/16 1523  11/28/16 1333 11/30/16 0505  12/12/16 0901 12/19/16 1054 01/04/17 1114 01/11/17 0947  NA 137 137   < > 133* 131*   < > 134* 136 134* 134*  K 4.3 4.3   < > 4.2 3.9   < > 4.3 4.2 4.5 4.3  CL 101 99  --  95* 99*  --   --   --   --  97*  CO2 32 30   < > 29 26   < > 29 30* 30* 30*  GLUCOSE 229* 147*   < > 205* 136*   < > 222* 230* 253* 323*  BUN 22* 18   < > 23* 14   < > 23.7 19.2 17.4 22  CALCIUM 9.4 9.5   < > 9.1 8.5*   < > 9.3 9.2 9.1 8.9  CREATININE 0.90 1.01   < > 0.70 0.64   < > 0.8 0.8 0.8 0.90  GFRNONAA >60  --   --  >60 >60  --   --   --   --  >60  GFRAA >60  --   --  >60 >60  --   --   --   --  >60   < > = values in this interval not displayed.    LIVER FUNCTION TESTS: Recent Labs    12/12/16 0901 12/19/16 1054 01/04/17 1114 01/11/17 0947  BILITOT 0.62 0.54 0.79 0.5  AST 22 17 14 13   ALT 18 10 12 8   ALKPHOS 72 76 73 70  PROT 6.7 6.4 6.2* 6.0*  ALBUMIN 3.1* 3.2* 3.2* 2.9*    Assessment and Plan:  Leaking gastrostomy tube.  Patient also examined by Dr. Anselm Pancoast.  Will proceed with image guided exchange/upsize of the tube today.  Risks and benefits discussed with the patient including, but not limited to bleeding, infection, peritonitis, or damage to adjacent structures.  All of the patient's questions were answered, patient is agreeable to proceed. Consent signed and in chart.   Electronically Signed: Murrell Redden, PA-C 01/14/2017, 12:16 PM   I spent a total of 15 Minutes at the the patient's bedside AND on the patient's hospital floor or unit, greater than 50% of which was counseling/coordinating care for exchange gastrostomy tube.

## 2017-01-15 ENCOUNTER — Other Ambulatory Visit (HOSPITAL_COMMUNITY): Payer: Self-pay | Admitting: Diagnostic Radiology

## 2017-01-15 ENCOUNTER — Encounter (HOSPITAL_COMMUNITY): Payer: Self-pay | Admitting: Diagnostic Radiology

## 2017-01-15 ENCOUNTER — Telehealth: Payer: Self-pay

## 2017-01-15 ENCOUNTER — Ambulatory Visit (HOSPITAL_COMMUNITY)
Admission: RE | Admit: 2017-01-15 | Discharge: 2017-01-15 | Disposition: A | Discharge status: Home / Self Care | Payer: PPO | Source: Ambulatory Visit | Attending: Diagnostic Radiology | Admitting: Diagnostic Radiology

## 2017-01-15 DIAGNOSIS — K9423 Gastrostomy malfunction: Secondary | ICD-10-CM

## 2017-01-15 DIAGNOSIS — R633 Feeding difficulties, unspecified: Secondary | ICD-10-CM

## 2017-01-15 HISTORY — PX: IR GASTR TUBE CONVERT GASTR-JEJ PER W/FL MOD SED: IMG2332

## 2017-01-15 MED ORDER — LIDOCAINE VISCOUS 2 % MT SOLN
OROMUCOSAL | Status: DC | PRN
  Administered 2017-01-15: 15 mL via OROMUCOSAL

## 2017-01-15 MED ORDER — IOPAMIDOL (ISOVUE-300) INJECTION 61%
INTRAVENOUS | Status: AC
  Administered 2017-01-15: 20 mL
  Filled 2017-01-15: qty 50

## 2017-01-15 MED ORDER — LIDOCAINE VISCOUS 2 % MT SOLN
OROMUCOSAL | Status: AC
  Filled 2017-01-15: qty 15

## 2017-01-15 NOTE — Telephone Encounter (Signed)
Wife called and left message to call her.  Called back. Tube feeding was changed in IR today. Started leaking when they got home. Wife called [facility] IR and let them know. They wanted them to come back, but Mr. Marks it exhausted and in bed already. IR told the wife the options are place a bigger tube or remove it completely and let him heal up. Wife said they mentioned a tube that could go directly to intestines in the past, is this a option. Wound care nurse saw patient in IR and he was given some type of paste to put on irritated skin around tube.   Wife is concerned and not sure what to do. Patient has not ate since Saturday.

## 2017-01-15 NOTE — Telephone Encounter (Signed)
I do not know much about how to fix the feeding tube situation. If IR plan for bigger tube that might solve the issue, then that would be preferable than a tube directly into the intestine. If the patient has not eaten for 3 days, he may be at risk for severe dehydration and severe malnutrition then the family should bring him to the ER to be admitted for definitive management of his feeding situation

## 2017-01-15 NOTE — Procedures (Signed)
Persistent leakage from 22 Fr G-tube. Tube was converted to a 26 Fr G-J tube.  Tip in proximal jejunum.  Instructed patient to preferentially use the J-tube in order to decrease leakage on skin.  Minimal bleeding and no immediate complication.

## 2017-01-15 NOTE — Telephone Encounter (Signed)
Called with below message. Andres Smith is going to call IR then call us back with what she decides to do.

## 2017-01-16 ENCOUNTER — Other Ambulatory Visit (HOSPITAL_COMMUNITY): Payer: Self-pay | Admitting: Diagnostic Radiology

## 2017-01-16 ENCOUNTER — Encounter (HOSPITAL_COMMUNITY): Payer: Self-pay

## 2017-01-16 DIAGNOSIS — R633 Feeding difficulties, unspecified: Secondary | ICD-10-CM

## 2017-01-16 NOTE — Telephone Encounter (Signed)
Wife left message to call her.  Called back. Tube feeding was changed again yesterday to a gastrojejunostomy tube. Per wife Dr. Anselm Pancoast the IR radiologist said if this does not work it needs to be removed. Andres Smith does not like the tube. The tube  is uncomfortable and he is starting to eat a little more because he wants to get it out as soon as possible.  They are both anxious and would like to talk Dr. Alvy Bimler regarding above during appt on Friday.  Called and left message for Dory Peru,  per wife to request info from her for appt this Friday for suggestions on the best way to hurry process along.

## 2017-01-17 ENCOUNTER — Inpatient Hospital Stay (HOSPITAL_BASED_OUTPATIENT_CLINIC_OR_DEPARTMENT_OTHER): Payer: PPO | Admitting: Medical

## 2017-01-17 ENCOUNTER — Inpatient Hospital Stay (HOSPITAL_COMMUNITY)
Admission: AD | Admit: 2017-01-17 | Discharge: 2017-02-14 | DRG: 326 | Disposition: A | Discharge status: Hospice | Payer: PPO | Source: Ambulatory Visit | Attending: Internal Medicine | Admitting: Internal Medicine

## 2017-01-17 ENCOUNTER — Telehealth: Payer: Self-pay | Admitting: *Deleted

## 2017-01-17 ENCOUNTER — Other Ambulatory Visit: Payer: Self-pay | Admitting: *Deleted

## 2017-01-17 ENCOUNTER — Inpatient Hospital Stay: Payer: PPO

## 2017-01-17 VITALS — BP 112/76 | HR 89 | Temp 97.8°F | Wt 147.1 lb

## 2017-01-17 DIAGNOSIS — R531 Weakness: Secondary | ICD-10-CM | POA: Diagnosis not present

## 2017-01-17 DIAGNOSIS — Z794 Long term (current) use of insulin: Secondary | ICD-10-CM | POA: Diagnosis not present

## 2017-01-17 DIAGNOSIS — E860 Dehydration: Secondary | ICD-10-CM

## 2017-01-17 DIAGNOSIS — Z66 Do not resuscitate: Secondary | ICD-10-CM | POA: Diagnosis present

## 2017-01-17 DIAGNOSIS — R64 Cachexia: Secondary | ICD-10-CM | POA: Diagnosis not present

## 2017-01-17 DIAGNOSIS — G893 Neoplasm related pain (acute) (chronic): Secondary | ICD-10-CM | POA: Diagnosis not present

## 2017-01-17 DIAGNOSIS — C060 Malignant neoplasm of cheek mucosa: Secondary | ICD-10-CM | POA: Diagnosis present

## 2017-01-17 DIAGNOSIS — K9423 Gastrostomy malfunction: Secondary | ICD-10-CM

## 2017-01-17 DIAGNOSIS — Y833 Surgical operation with formation of external stoma as the cause of abnormal reaction of the patient, or of later complication, without mention of misadventure at the time of the procedure: Secondary | ICD-10-CM | POA: Diagnosis present

## 2017-01-17 DIAGNOSIS — E1165 Type 2 diabetes mellitus with hyperglycemia: Secondary | ICD-10-CM | POA: Diagnosis present

## 2017-01-17 DIAGNOSIS — B370 Candidal stomatitis: Secondary | ICD-10-CM | POA: Diagnosis present

## 2017-01-17 DIAGNOSIS — K219 Gastro-esophageal reflux disease without esophagitis: Secondary | ICD-10-CM | POA: Diagnosis present

## 2017-01-17 DIAGNOSIS — K5010 Crohn's disease of large intestine without complications: Secondary | ICD-10-CM

## 2017-01-17 DIAGNOSIS — L03311 Cellulitis of abdominal wall: Secondary | ICD-10-CM | POA: Diagnosis present

## 2017-01-17 DIAGNOSIS — M25551 Pain in right hip: Secondary | ICD-10-CM

## 2017-01-17 DIAGNOSIS — Z7901 Long term (current) use of anticoagulants: Secondary | ICD-10-CM | POA: Diagnosis not present

## 2017-01-17 DIAGNOSIS — E876 Hypokalemia: Secondary | ICD-10-CM | POA: Diagnosis not present

## 2017-01-17 DIAGNOSIS — L8990 Pressure ulcer of unspecified site, unspecified stage: Secondary | ICD-10-CM

## 2017-01-17 DIAGNOSIS — R627 Adult failure to thrive: Secondary | ICD-10-CM | POA: Diagnosis present

## 2017-01-17 DIAGNOSIS — K8000 Calculus of gallbladder with acute cholecystitis without obstruction: Secondary | ICD-10-CM | POA: Diagnosis present

## 2017-01-17 DIAGNOSIS — K5090 Crohn's disease, unspecified, without complications: Secondary | ICD-10-CM | POA: Diagnosis not present

## 2017-01-17 DIAGNOSIS — L89159 Pressure ulcer of sacral region, unspecified stage: Secondary | ICD-10-CM | POA: Diagnosis not present

## 2017-01-17 DIAGNOSIS — I25810 Atherosclerosis of coronary artery bypass graft(s) without angina pectoris: Secondary | ICD-10-CM | POA: Diagnosis not present

## 2017-01-17 DIAGNOSIS — L989 Disorder of the skin and subcutaneous tissue, unspecified: Secondary | ICD-10-CM | POA: Diagnosis not present

## 2017-01-17 DIAGNOSIS — C7800 Secondary malignant neoplasm of unspecified lung: Secondary | ICD-10-CM | POA: Diagnosis present

## 2017-01-17 DIAGNOSIS — E43 Unspecified severe protein-calorie malnutrition: Secondary | ICD-10-CM | POA: Diagnosis present

## 2017-01-17 DIAGNOSIS — D61818 Other pancytopenia: Secondary | ICD-10-CM | POA: Diagnosis not present

## 2017-01-17 DIAGNOSIS — I482 Chronic atrial fibrillation: Secondary | ICD-10-CM

## 2017-01-17 DIAGNOSIS — I2510 Atherosclerotic heart disease of native coronary artery without angina pectoris: Secondary | ICD-10-CM | POA: Diagnosis present

## 2017-01-17 DIAGNOSIS — L89151 Pressure ulcer of sacral region, stage 1: Secondary | ICD-10-CM | POA: Diagnosis present

## 2017-01-17 DIAGNOSIS — I10 Essential (primary) hypertension: Secondary | ICD-10-CM

## 2017-01-17 DIAGNOSIS — Z87891 Personal history of nicotine dependence: Secondary | ICD-10-CM | POA: Diagnosis not present

## 2017-01-17 DIAGNOSIS — T451X5A Adverse effect of antineoplastic and immunosuppressive drugs, initial encounter: Secondary | ICD-10-CM

## 2017-01-17 DIAGNOSIS — Z951 Presence of aortocoronary bypass graft: Secondary | ICD-10-CM | POA: Diagnosis not present

## 2017-01-17 DIAGNOSIS — C7889 Secondary malignant neoplasm of other digestive organs: Secondary | ICD-10-CM | POA: Diagnosis present

## 2017-01-17 DIAGNOSIS — D696 Thrombocytopenia, unspecified: Secondary | ICD-10-CM | POA: Diagnosis not present

## 2017-01-17 DIAGNOSIS — Z7189 Other specified counseling: Secondary | ICD-10-CM | POA: Diagnosis not present

## 2017-01-17 DIAGNOSIS — D61810 Antineoplastic chemotherapy induced pancytopenia: Secondary | ICD-10-CM | POA: Diagnosis present

## 2017-01-17 DIAGNOSIS — C7951 Secondary malignant neoplasm of bone: Secondary | ICD-10-CM | POA: Diagnosis present

## 2017-01-17 DIAGNOSIS — E118 Type 2 diabetes mellitus with unspecified complications: Secondary | ICD-10-CM

## 2017-01-17 DIAGNOSIS — D6481 Anemia due to antineoplastic chemotherapy: Secondary | ICD-10-CM

## 2017-01-17 DIAGNOSIS — I951 Orthostatic hypotension: Secondary | ICD-10-CM

## 2017-01-17 DIAGNOSIS — E785 Hyperlipidemia, unspecified: Secondary | ICD-10-CM

## 2017-01-17 DIAGNOSIS — Z515 Encounter for palliative care: Secondary | ICD-10-CM | POA: Diagnosis present

## 2017-01-17 DIAGNOSIS — R1310 Dysphagia, unspecified: Secondary | ICD-10-CM | POA: Diagnosis present

## 2017-01-17 DIAGNOSIS — Z5111 Encounter for antineoplastic chemotherapy: Secondary | ICD-10-CM | POA: Diagnosis not present

## 2017-01-17 LAB — CBC WITH DIFFERENTIAL/PLATELET
BASOS ABS: 0 10*3/uL (ref 0.0–0.1)
Basophils Relative: 0 %
Eosinophils Absolute: 0 10*3/uL (ref 0.0–0.7)
Eosinophils Relative: 1 %
HEMATOCRIT: 27.9 % — AB (ref 39.0–52.0)
HEMOGLOBIN: 9.3 g/dL — AB (ref 13.0–17.0)
LYMPHS PCT: 33 %
Lymphs Abs: 0.7 10*3/uL (ref 0.7–4.0)
MCH: 32.1 pg (ref 26.0–34.0)
MCHC: 33.3 g/dL (ref 30.0–36.0)
MCV: 96.2 fL (ref 78.0–100.0)
Monocytes Absolute: 0.2 10*3/uL (ref 0.1–1.0)
Monocytes Relative: 9 %
NEUTROS ABS: 1.2 10*3/uL — AB (ref 1.7–7.7)
NEUTROS PCT: 57 %
Platelets: 162 10*3/uL (ref 150–400)
RBC: 2.9 MIL/uL — AB (ref 4.22–5.81)
RDW: 16.3 % — ABNORMAL HIGH (ref 11.5–15.5)
WBC: 2.2 10*3/uL — AB (ref 4.0–10.5)

## 2017-01-17 LAB — COMPREHENSIVE METABOLIC PANEL
ALT: 10 U/L — ABNORMAL LOW (ref 17–63)
ANION GAP: 8 (ref 5–15)
AST: 17 U/L (ref 15–41)
Albumin: 3.2 g/dL — ABNORMAL LOW (ref 3.5–5.0)
Alkaline Phosphatase: 62 U/L (ref 38–126)
BUN: 26 mg/dL — AB (ref 6–20)
CHLORIDE: 100 mmol/L — AB (ref 101–111)
CO2: 32 mmol/L (ref 22–32)
Calcium: 9.1 mg/dL (ref 8.9–10.3)
Creatinine, Ser: 0.73 mg/dL (ref 0.61–1.24)
Glucose, Bld: 193 mg/dL — ABNORMAL HIGH (ref 65–99)
POTASSIUM: 3.4 mmol/L — AB (ref 3.5–5.1)
Sodium: 140 mmol/L (ref 135–145)
Total Bilirubin: 1.2 mg/dL (ref 0.3–1.2)
Total Protein: 6.5 g/dL (ref 6.5–8.1)

## 2017-01-17 LAB — CMP (CANCER CENTER ONLY)
ALBUMIN: 3.3 g/dL — AB (ref 3.5–5.0)
ALT: 8 U/L (ref 0–55)
AST: 12 U/L (ref 5–34)
Alkaline Phosphatase: 72 U/L (ref 40–150)
Anion gap: 11 (ref 3–11)
BILIRUBIN TOTAL: 1.1 mg/dL (ref 0.2–1.2)
BUN: 33 mg/dL — ABNORMAL HIGH (ref 7–26)
CO2: 33 mmol/L — ABNORMAL HIGH (ref 22–29)
Calcium: 9.4 mg/dL (ref 8.4–10.4)
Chloride: 96 mmol/L — ABNORMAL LOW (ref 98–109)
Creatinine: 1 mg/dL (ref 0.70–1.30)
GFR, Est AFR Am: >60 mL/min (ref >60)
GFR, Estimated: >60 mL/min (ref >60)
GLUCOSE: 343 mg/dL — AB (ref 70–140)
Potassium: 3.5 mmol/L (ref 3.5–5.1)
SODIUM: 140 mmol/L (ref 136–145)
TOTAL PROTEIN: 6.6 g/dL (ref 6.4–8.3)

## 2017-01-17 LAB — CBC WITH DIFFERENTIAL (CANCER CENTER ONLY)
BASOS ABS: 0 10*3/uL (ref 0.0–0.1)
Basophils Relative: 1 %
Eosinophils Absolute: 0 10*3/uL (ref 0.0–0.5)
Eosinophils Relative: 0 %
HEMATOCRIT: 29.1 % — AB (ref 38.4–49.9)
Hemoglobin: 9.5 g/dL — ABNORMAL LOW (ref 13.0–17.1)
Lymphocytes Relative: 19 %
Lymphs Abs: 0.3 10*3/uL — ABNORMAL LOW (ref 0.9–3.3)
MCH: 31.5 pg (ref 27.2–33.4)
MCHC: 32.8 g/dL (ref 32.0–36.0)
MCV: 96.2 fL (ref 79.3–98.0)
MONO ABS: 0.1 10*3/uL (ref 0.1–0.9)
Monocytes Relative: 9 %
NEUTROS ABS: 1.1 10*3/uL — AB (ref 1.5–6.5)
Neutrophils Relative %: 71 %
Platelet Count: 183 10*3/uL (ref 140–400)
RBC: 3.02 MIL/uL — AB (ref 4.20–5.82)
RDW: 17.8 % — AB (ref 11.0–15.6)
WBC Count: 1.5 10*3/uL — ABNORMAL LOW (ref 4.0–10.3)

## 2017-01-17 LAB — MAGNESIUM: MAGNESIUM: 1.9 mg/dL (ref 1.7–2.4)

## 2017-01-17 MED ORDER — ONDANSETRON HCL 4 MG PO TABS
4.0000 mg | ORAL_TABLET | Freq: Four times a day (QID) | ORAL | Status: DC | PRN
  Administered 2017-02-02: 4 mg via ORAL
  Filled 2017-01-17: qty 1

## 2017-01-17 MED ORDER — ONDANSETRON HCL 4 MG/2ML IJ SOLN: 4.0000 mg | Freq: Four times a day (QID) | INTRAMUSCULAR | Status: DC | PRN

## 2017-01-17 MED ORDER — INSULIN ASPART 100 UNIT/ML ~~LOC~~ SOLN
0.0000 [IU] | Freq: Three times a day (TID) | SUBCUTANEOUS | Status: DC
  Administered 2017-01-18 (×2): 2 [IU] via SUBCUTANEOUS
  Administered 2017-01-19 (×2): 1 [IU] via SUBCUTANEOUS
  Administered 2017-01-19: 2 [IU] via SUBCUTANEOUS
  Administered 2017-01-20 – 2017-01-21 (×3): 1 [IU] via SUBCUTANEOUS

## 2017-01-17 MED ORDER — ACETAMINOPHEN 650 MG RE SUPP
650.0000 mg | Freq: Four times a day (QID) | RECTAL | Status: DC | PRN
  Filled 2017-01-17: qty 1

## 2017-01-17 MED ORDER — MORPHINE SULFATE (PF) 2 MG/ML IV SOLN
1.0000 mg | INTRAVENOUS | Status: DC | PRN
  Administered 2017-01-17 – 2017-01-19 (×8): 1 mg via INTRAVENOUS
  Filled 2017-01-17 (×9): qty 1

## 2017-01-17 MED ORDER — ALBUTEROL SULFATE (2.5 MG/3ML) 0.083% IN NEBU: 2.5000 mg | INHALATION_SOLUTION | Freq: Four times a day (QID) | RESPIRATORY_TRACT | Status: DC | PRN

## 2017-01-17 MED ORDER — MORPHINE SULFATE (PF) 4 MG/ML IV SOLN
INTRAVENOUS | Status: AC
  Filled 2017-01-17: qty 1

## 2017-01-17 MED ORDER — ACETAMINOPHEN 325 MG PO TABS: 650.0000 mg | ORAL_TABLET | Freq: Four times a day (QID) | ORAL | Status: DC | PRN

## 2017-01-17 MED ORDER — MORPHINE SULFATE 4 MG/ML IJ SOLN
2.0000 mg | Freq: Once | INTRAMUSCULAR | Status: AC
  Administered 2017-01-17: 2 mg via INTRAVENOUS
  Filled 2017-01-17: qty 1

## 2017-01-17 MED ORDER — VANCOMYCIN HCL IN DEXTROSE 1-5 GM/200ML-% IV SOLN: 1000.0000 mg | Freq: Once | INTRAVENOUS | Status: DC

## 2017-01-17 MED ORDER — VANCOMYCIN HCL IN DEXTROSE 1-5 GM/200ML-% IV SOLN
1000.0000 mg | INTRAVENOUS | Status: DC
  Administered 2017-01-17 – 2017-01-19 (×3): 1000 mg via INTRAVENOUS
  Filled 2017-01-17 (×3): qty 200

## 2017-01-17 MED ORDER — SODIUM CHLORIDE 0.9 % IV SOLN
INTRAVENOUS | Status: DC
  Administered 2017-01-17: 15:00:00 via INTRAVENOUS

## 2017-01-17 MED ORDER — DEXTROSE-NACL 5-0.45 % IV SOLN
INTRAVENOUS | Status: DC
  Administered 2017-01-17: 22:00:00 via INTRAVENOUS

## 2017-01-17 NOTE — Progress Notes (Signed)
Pt has #26 fr PEG tube in place with ongoing leakage of stomach contents/feedings onto skin surface.  Skin mascerated, raw and very tender.  Cleansed area gently with saline and then covered with vaseline gauze and 4x4's. Final covering is with ABD pad and hypafix tape.  Pt states he feels more comfortable after dressing change.  1/2 after above dressing changed, pt c/o burning at PEG site.  Pt had leakage of stomach contents which irritated already raw, irritated skin. Area cleansed again with saline and, applied thick layer of zinc oxide cream to raw areas and then covered lightly with 4x4 and abd pad, secured with hypafix tape.  Pt requesting pain med. Received order for Morphine Sulfate 2mg  from Sandi Mealy, PA. Pt experienced relief within 15 minutes.  VSS.  Checked S02 off 02 and now S02 at 99%. Denies any SOB. States he is feeling better after the morphine.  Sipping on water.  Pt to be admitted for continued hydration and PEG tube removal per IR.  Report called to vicki, RN on [facility] room # [id number]. Transported via w/c per this RN after dressing change to port. Now with Sorbaview and biopatch in place.  Wife accompanied pt to [facility].

## 2017-01-17 NOTE — Progress Notes (Signed)
[facility] Progress Note   Andres Smith [id number] 05/10/1940 77 y.o.  Andres Smith is managed by Dr. [name]  Actively treated with chemotherapy: yes  Current Therapy:  Carboplatin and Taxol  Last Treated: 01/11/2017  Assessment: Plan:    Dehydration - Plan: 0.9 %  sodium chloride infusion, morphine 4 MG/ML injection 2 mg  PEG tube malfunction (HCC)   Dehydration: Mr. Tolson was given IV fluids while in clinic.  Dr. Sloan Leiter graciously agreed to take the patient under his care for a direct admission for management of his dehydration.  PEG tube malfunction: I discussed with Dr. Sloan Leiter that the patient needs to be evaluated by interventional radiology for removal of his malfunctioning PEG tube given the fact that the patient has had it replaced twice thus far.  I have also recommended that the patient be seen by nutrition while he is hospitalized.  Please see After Visit Summary for patient specific instructions.  Future Appointments  Date Time Provider [place]  01/23/2017 12:20 PM Patsey Berthold, NP [facility]  01/31/2017  2:30 PM Garvin Fila, MD [facility] None    No orders of the defined types were placed in this encounter.      Subjective:   Patient ID:  Andres Smith is a 77 y.o. (DOB November 11, 1940) male.  Chief Complaint:  Chief Complaint  Patient presents with  . 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  He has been treated with radiation therapy and most recently received cycle 5-day 1 of carboplatin and paclitaxel on 01/11/2017.  He has been receiving nutritional support via PEG tube despite the fact that he is able to swallow although with some difficulty.  His PEG tube has been replaced at least twice and is currently leaking.  The patient is having skin breakdown and increased pain around the site due to  maceration of his skin.  He denies fevers, chills, sweats, nausea, or vomiting.  His blood sugar was elevated at 343 today despite his use of metformin 500 mg p.o. twice daily.  He continues on prednisone 10 mg every morning.  Medications: I have reviewed the patient's current medications.  Allergies: No Known Allergies  Past Medical History:  Diagnosis Date  . Ankylosing spondylitis ([place])   . Blood transfusion    hx of transfusion without reaction  . Bradycardia   . Cancer ([place]) 03/2016   oral  . Cholelithiasis   . Coronary artery disease    a. s/p CABG x 6 in 1997 (LIMA->LAD, VG->RI ->OM1->OM2, VG->PDA->PLV // b. 05/2011 Cath:  patent grafs, native prox rca and d2 dzs  ->med rx. // c. Myoview 1/18: not gated, large inferolateral scar, no ischemia; Intermediate Risk (IL scar old - on prior studies >> med rx)  . Crohn's colitis ([place]) 12/01/1997  . Diabetes mellitus   . Dysphagia   . Fatty liver   . GERD (gastroesophageal reflux disease)   . Heart block   . Hiatal hernia 12/03/2008  . History of radiation therapy 05/23/16- 07/09/16   Left Cheek/ 60 Gy in 30 fractions to gross disease, 63 Gy in 35 fractions to high risk nodal echelons, and 56 Gy in 35 fractions to intermedicate risk nodal echelons.   . History of skin cancer   . Hx of echocardiogram    Echo 5/14:  Mild  LVH, EF 55-60%, NL diast fxn, mild LAE   . Hyperlipidemia   . Hypertension   . Kidney stones   . Neck pain   . Pneumonia    hx of PNA  . Squamous cell cancer of buccal mucosa ([place])   . Stroke ([place]) 11/2016  . 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 cancer removal  2014   removed from left outter leg  . COLONOSCOPY  2012  . CORONARY ARTERY BYPASS GRAFT  04/19/1995   x7  . EXCISION ORAL TUMOR Left    left jaw and lymph node  . IR FLUORO GUIDE PORT INSERTION RIGHT  05/21/2016  . IR GASTR TUBE CONVERT GASTR-JEJ PER W/FL MOD SED  01/15/2017  .  IR GASTROSTOMY TUBE MOD SED  05/21/2016  . IR RADIOLOGIST EVAL & MGMT  01/09/2017  . IR REPLC GASTRO/COLONIC TUBE PERCUT W/FLUORO  01/14/2017  . IR US GUIDE VASC ACCESS RIGHT  05/21/2016  . LEFT HEART CATHETERIZATION WITH CORONARY/GRAFT ANGIOGRAM N/A 05/24/2011   Procedure: LEFT HEART CATHETERIZATION WITH Beatrix Fetters;  Surgeon: Larey Dresser, MD;  Location: [facility] C[facility] LAB;  Service: Cardiovascular;  Laterality: N/A;  . MOHS SURGERY     X 2 off chin and nose  . TONSILLECTOMY AND ADENOIDECTOMY  1944  . TRIGGER FINGER RELEASE     x 2  . UMBILICAL HERNIA REPAIR      Family History  Problem Relation Age of Onset  . Heart disease Father   . Heart disease Mother   . Arthritis Mother   . Heart disease Unknown   . Crohn's disease Unknown   . Heart disease Sister   . Colon cancer Paternal Grandmother     Social History   Socioeconomic History  . Marital status: Married    Spouse name: Not on file  . Number of children: 2  . Years of education: Not on file  . Highest education level: Not on file  Social Needs  . Financial resource strain: Not on file  . Food insecurity - worry: Not on file  . Food insecurity - inability: Not on file  . Transportation needs - medical: Not on file  . Transportation needs - non-medical: Not on file  Occupational History  . Occupation: semi retired    Fish farm manager: SELF EMPLOYED  Tobacco Use  . Smoking status: Former Smoker    Last attempt to quit: 01/02/1983    Years since quitting: 34.0  . Smokeless tobacco: Former Systems developer    Types: [place] date: 01/01/2010  Substance and Sexual Activity  . Alcohol use: Yes    Comment: tottie every night  . Drug use: No  . Sexual activity: Not Currently  Other Topics Concern  . Not on file  Social History Narrative   HSG, [employer] - Civil engineering/education in industrial, [place] from [place]. Married '64. 1 son '74, 1 dtr - '69; 1 grandchild, 1 on the way. 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 Lives with wife. ACP -             Past Medical History, Surgical history, Social history, and Family history were reviewed and updated as appropriate.   Please see review of systems for further details on the patient's review from  today.   Review of Systems:  Review of Systems  Constitutional: Positive for appetite change and fatigue. Negative for chills, diaphoresis and fever.  HENT: Positive for trouble swallowing.   Respiratory: Negative for cough, choking, chest tightness and shortness of breath.   Cardiovascular: Negative for chest pain and leg swelling.  Gastrointestinal: Negative for constipation, diarrhea, nausea and vomiting.  Skin:       Macerated skin with decreased integrity of the skin around the PEG tube.    Objective:   Physical Exam:  BP 112/76 (BP Location: Left Arm, Patient Position: Sitting)   Pulse 89   Temp 97.8 F (36.6 C) (Axillary)   Wt 147 lb 2 oz (66.7 kg)   SpO2 99%   BMI 22.37 kg/m  ECOG: 2   Physical Exam  Constitutional: No distress.  The patient is an elderly male who appears to be chronically ill but in no apparent distress.  HENT:  Head: Normocephalic and atraumatic.  Cardiovascular: S1 normal and S2 normal. Tachycardia present.  Pulmonary/Chest: Effort normal and breath sounds normal. No respiratory distress. He has no wheezes. He has no rales.  Abdominal: Soft. Bowel sounds are normal.    Neurological: He is alert. Coordination (The patient is ambulating with the use of a wheelchair.) abnormal.  Skin: He is not diaphoretic.  Psychiatric: He has a normal mood and affect. His behavior is normal. Judgment and thought content normal.    Lab Review:     Component Value Date/Time   NA 140 01/17/2017 1331   NA 134 (L) 01/04/2017 1114   K 3.5  01/17/2017 1331   K 4.5 01/04/2017 1114   CL 96 (L) 01/17/2017 1331   CO2 33 (H) 01/17/2017 1331   CO2 30 (H) 01/04/2017 1114   GLUCOSE 343 (H) 01/17/2017 1331   GLUCOSE 253 (H) 01/04/2017 1114   GLUCOSE 98 12/12/2005 0729   BUN 33 (H) 01/17/2017 1331   BUN 17.4 01/04/2017 1114   CREATININE 0.90 01/11/2017 0947   CREATININE 0.8 01/04/2017 1114   CALCIUM 9.4 01/17/2017 1331   CALCIUM 9.1 01/04/2017 1114   PROT 6.6 01/17/2017 1331   PROT 6.2 (L) 01/04/2017 1114   ALBUMIN 3.3 (L) 01/17/2017 1331   ALBUMIN 3.2 (L) 01/04/2017 1114   AST 12 01/17/2017 1331   AST 14 01/04/2017 1114   ALT 8 01/17/2017 1331   ALT 12 01/04/2017 1114   ALKPHOS 72 01/17/2017 1331   ALKPHOS 73 01/04/2017 1114   BILITOT 1.1 01/17/2017 1331   BILITOT 0.79 01/04/2017 1114   GFRNONAA >60 01/17/2017 1331   GFRAA >60 01/17/2017 1331       Component Value Date/Time   WBC 1.5 (L) 01/17/2017 1331   WBC 3.8 (L) 01/11/2017 0947   RBC 3.02 (L) 01/17/2017 1331   HGB 8.9 (L) 01/11/2017 0947   HGB 9.7 (L) 01/04/2017 1113   HCT 29.1 (L) 01/17/2017 1331   HCT 29.7 (L) 01/04/2017 1113   PLT 183 01/17/2017 1331   PLT 100 (L) 01/04/2017 1113   MCV 96.2 01/17/2017 1331   MCV 96.8 01/04/2017 1113   MCH 31.5 01/17/2017 1331   MCHC 32.8 01/17/2017 1331   RDW 17.8 (H) 01/17/2017 1331   RDW 18.2 (H) 01/04/2017 1113   LYMPHSABS 0.3 (L) 01/17/2017 1331   LYMPHSABS 0.4 (L) 01/04/2017 1113   MONOABS 0.1 01/17/2017 1331   MONOABS 0.5 01/04/2017 1113   EOSABS 0.0 01/17/2017 1331   EOSABS 0.0 01/04/2017 1113   BASOSABS 0.0 01/17/2017 1331  BASOSABS 0.0 01/04/2017 1113   -------------------------------  Imaging from last 24 hours (if applicable):  Radiology interpretation: Ir Sondra Come Per W/fl Mod Sed  Result Date: 01/15/2017 INDICATION: 77 year old with persistent leakage from the gastrostomy tube that is causing severe skin irritation. Plan for tube up sizing and conversion to a GJ tube. EXAM:  CONVERSION OF GASTROSTOMY TUBE TO GASTROJEJUNOSTOMY TUBE MEDICATIONS: None ANESTHESIA/SEDATION: None CONTRAST:  20 mL Isovue-300-administered into the gastric lumen. FLUOROSCOPY TIME:  Fluoroscopy Time: 3 minutes 18 seconds, 16 mGy COMPLICATIONS: None immediate. PROCEDURE: Informed written consent was obtained from the patient after a thorough discussion of the procedural risks, benefits and alternatives. All questions were addressed. Maximal Sterile Barrier Technique was utilized including caps, mask, sterile gowns, sterile gloves, sterile drape, hand hygiene and skin antiseptic. A timeout was performed prior to the initiation of the procedure. The existing 57 French gastrostomy tube was removed by deflating the balloon. There was bleeding from the tube tract. A Cobra catheter was easily advanced into the stomach and directed into the duodenum using a stiff Glidewire. A stiff Glidewire was advanced into the small bowel. A 26 French GJ tube was advanced over the wire. Catheter tip was advanced into the proximal jejunum. The gastric retention balloon was inflated with 10 mL of saline. Contrast injection confirmed the jejunal tube is within small bowel. The gastric lumen is in the stomach. The bumper was cinched up to the skin. FINDINGS: Gastrostomy tube hole is quite large with surrounding skin irritation. GJ tube was successfully placed. The tip of the J tube is in the proximal small bowel. IMPRESSION: Successful conversion of the gastrostomy tube to a 26 [place] GJ tube. Instructed the patient to decrease oral fluid intake and substitute it for intake through his jejunal tube. If the patient has less gastric contents, hopefully, there will be less leakage and the skin around the tube will begin to heal. Wound nurse evaluated the patient yesterday and the patient and his wife were given instructions about skin care. Patient is also scheduled to meet with dietitian at the cancer center. Electronically Signed   By:  Markus Daft M.D.   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 Patient has leakage around the gastrostomy tube and skin breakdown. Patient presents for G-tube evaluation. Plan for up sizing of the G tube and wound nurse has been consulted. EXAM: GASTROSTOMY CATHETER REPLACEMENT WITH FLUOROSCOPY MEDICATIONS: None ANESTHESIA/SEDATION: Sublingual Ativan CONTRAST:  40 mL-administered into the gastric lumen. FLUOROSCOPY TIME:  Fluoroscopy Time: 30 seconds, 6 mGy COMPLICATIONS: None immediate. PROCEDURE: Informed written consent was obtained from the patient after a thorough discussion of the procedural risks, benefits and alternatives. All questions were addressed. Maximal Sterile Barrier Technique was utilized including caps, mask, sterile gowns, sterile gloves, sterile drape, hand hygiene and skin antiseptic. A timeout was performed prior to the initiation of the procedure. Contrast was injected through the existing gastrostomy tube. Gastrostomy tube was easily removed with manual traction. A new 71 French balloon retention catheter was easily advanced into stomach and the balloon was inflated. Additional contrast was injected to confirm placement in the stomach. FINDINGS: Gastrostomy tube is well positioned in the stomach. Contrast drains into the duodenum and no evidence for gastric obstruction. IMPRESSION: Successful exchange of the gastrostomy tube with fluoroscopy. Patient now has a 63 [place] gastrostomy tube. Wound nurse was consulted to help with the skin breakdown around  the tube. If the patient continues to have leakage, we can upsize the tube even larger, otherwise, the patient may need to have the tube removed because he is able to eat by mouth. Electronically Signed   By: Markus Daft M.D.   On: 01/14/2017 17:13   Ir Radiologist Eval & Mgmt  Result Date: 01/09/2017 EXAM: ESTABLISHED PATIENT OFFICE VISIT  CHIEF COMPLAINT: Leaking gastrostomy tube Current Pain Level: 0 HISTORY OF PRESENT ILLNESS: 77 year old with squamous cell carcinoma of the oral cavity. Percutaneous gastrostomy tube was placed on 05/21/2016 by Dr. Pascal Lux. Patient is supplementing his diet with Osmolite through the gastrostomy tube. Patient reports recent leakage around the tube with some redness and bleeding around the tube. Patient was seen in the emergency department last evening. He has been trying to increase the volume he puts through the gastrostomy tube in order to gain weight. Although the patient is able to eat by mouth, his appetite is poor. Patient is using the gastrostomy tube for bolus feeding and not using a continuous infusion. REVIEW OF SYSTEMS: Poor appetite PHYSICAL EXAMINATION: 20 French gastrostomy tube is positioned just below the left anterior rib cage. Mild redness and irritation around the tube. The gastrostomy tube was flushed with 30 mL of saline and there is no evidence of leakage. Saline easily flushed through the tube. 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 The gastrostomy tube is not leaking during today's examination. There is mild redness and irritation around the tube which could be compatible with recent leakage. Patient is not a good historian but it sounds like he has been increasing his tube feedings. Increasing tube feeds appears to be coinciding with the leakage. We discussed how he may need to slow down the tube feeds with increased volume. We also discussed how he should remain upright or possibly on his right side during and after the tube feeds. We also discussed trying keep the skin around the tube clean and dry. A thin gauze was placed between the skin and the bumper. Patient and family will contact us if he continues to have problems with the gastrostomy tube. Electronically Signed   By: Markus Daft M.D.   On: 01/09/2017 17:40          This case was discussed with Dr. Alvy Bimler. She expresses agreement with my management of this patient.

## 2017-01-17 NOTE — H&P (Signed)
History and Physical    Andres Smith ZOX:[id number] DOB: 1940/11/09 DOA: 01/17/2017  PCP: Janith Lima, MD Patient coming from: Home  Chief Complaint: G-tube malfunction  HPI: Andres Smith is a 77 y.o. male with medical history significant of buccal mucosa squamous cell carcinoma, dysphagia has a G-tube in place, atrial fibrillation, CAD status post CABG, recent CVA was admitted to the hospital for evaluation of malfunction of his G-tube.  Patient has a G-tube for past several months but recently has noted leakage.  Within the last 2 weeks this has been changed and increase in size but despite of that it continues to leak.  He follows with oncology, Dr Alvy Bimler and interventional radiology.  Last time is to was increased in size on January 15, 2017 but despite of this continues to have leakage.  He is also noticed irritation of the skin around it and due to this he has not had any feeding in the last 3 days.  He only takes p.o. water but rest via his feeding tube.  Patient was seen at the cancer center today and was sent to the hospital for direct admission due to symptoms of failure to thrive, possible abdominal cellulitis, dehydration and to address his G-tube area.  Patient only reports of abdominal discomfort around the site at this time otherwise no other complaints.   Review of Systems: As per HPI otherwise 10 point review of systems negative.   Past Medical History:  Diagnosis Date  . Ankylosing spondylitis ([place])   . Blood transfusion    hx of transfusion without reaction  . Bradycardia   . Cancer ([place]) 03/2016   oral  . Cholelithiasis   . Coronary artery disease    a. s/p CABG x 6 in 1997 (LIMA->LAD, VG->RI ->OM1->OM2, VG->PDA->PLV // b. 05/2011 Cath:  patent grafs, native prox rca and d2 dzs  ->med rx. // c. Myoview 1/18: not gated, large inferolateral scar, no ischemia; Intermediate Risk (IL scar old - on prior studies >> med rx)  . Crohn's colitis ([place]) 12/01/1997  . Diabetes  mellitus   . Dysphagia   . Fatty liver   . GERD (gastroesophageal reflux disease)   . Heart block   . Hiatal hernia 12/03/2008  . History of radiation therapy 05/23/16- 07/09/16   Left Cheek/ 60 Gy in 30 fractions to gross disease, 63 Gy in 35 fractions to high risk nodal echelons, and 56 Gy in 35 fractions to intermedicate risk nodal echelons.   . History of skin cancer   . Hx of echocardiogram    Echo 5/14:  Mild LVH, EF 55-60%, NL diast fxn, mild LAE   . Hyperlipidemia   . Hypertension   . Kidney stones   . Neck pain   . Pneumonia    hx of PNA  . Squamous cell cancer of buccal mucosa ([place])   . Stroke ([place]) 11/2016  . 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 cancer removal  2014   removed from left outter leg  . COLONOSCOPY  2012  . CORONARY ARTERY BYPASS GRAFT  04/19/1995   x7  . EXCISION ORAL TUMOR Left    left jaw and lymph node  . IR FLUORO GUIDE PORT INSERTION RIGHT  05/21/2016  . IR GASTR TUBE CONVERT GASTR-JEJ PER W/FL MOD SED  01/15/2017  . IR GASTROSTOMY TUBE MOD SED  05/21/2016  . IR RADIOLOGIST EVAL & MGMT  01/09/2017  .  IR REPLC GASTRO/COLONIC TUBE PERCUT W/FLUORO  01/14/2017  . IR US GUIDE VASC ACCESS RIGHT  05/21/2016  . LEFT HEART CATHETERIZATION WITH CORONARY/GRAFT ANGIOGRAM N/A 05/24/2011   Procedure: LEFT HEART CATHETERIZATION WITH Beatrix Fetters;  Surgeon: Larey Dresser, MD;  Location: [facility] CATH LAB;  Service: Cardiovascular;  Laterality: N/A;  . MOHS SURGERY     X 2 off chin and nose  . TONSILLECTOMY AND ADENOIDECTOMY  1944  . TRIGGER FINGER RELEASE     x 2  . UMBILICAL HERNIA REPAIR       reports that he quit smoking about 34 years ago. He quit smokeless tobacco use about 7 years ago. His smokeless tobacco use included chew. He reports that he drinks alcohol. He reports that he does not use drugs.  No Known Allergies  Family History  Problem Relation Age of Onset  . Heart  disease Father   . Heart disease Mother   . Arthritis Mother   . Heart disease Unknown   . Crohn's disease Unknown   . Heart disease Sister   . Colon cancer Paternal Grandmother      Prior to Admission medications   Medication Sig Start Date End Date Taking? Authorizing Provider  atorvastatin (LIPITOR) 80 MG tablet TAKE 1 TABLET AT 6PM. 12/04/16  Yes Sherren Mocha, MD  balsalazide (COLAZAL) 750 MG capsule TAKE 3 CAPSULES 3 TIMES A DAY. OPEN CAPS & SPRINKLE ON APPLESAUCE OR DISSOLVE IN WATER & TAKE VIA TUBE. 12/24/16  Yes Pyrtle, Lajuan Lines, MD  clopidogrel (PLAVIX) 75 MG tablet Take 1 tablet (75 mg total) by mouth daily. 11/30/16  Yes Costello, Kayren Eaves, NP  cyanocobalamin (,VITAMIN B-12,) 1000 MCG/ML injection INJECT 1ML ONCE MONTHLY. 05/22/16  Yes Janith Lima, MD  isosorbide mononitrate (ISMO,MONOKET) 20 MG tablet Crush 2 tablets (40 mg total) by mouth 2 (two) times daily at 10 AM and 5 PM. Patient taking differently: Take 40 mg by mouth 2 (two) times daily. Crush 2 tablets (40 mg total) by mouth 2 (two) times daily at 10 AM and 5 PM. 07/03/16  Yes Sherren Mocha, MD  metFORMIN (GLUCOPHAGE) 500 MG tablet Place 1 tablet (500 mg total) into feeding tube 2 (two) times daily with a meal. 09/07/16  Yes Janith Lima, MD  nitroGLYCERIN (NITROSTAT) 0.4 MG SL tablet Place 1 tablet (0.4 mg total) under the tongue every 5 (five) minutes as needed for chest pain. Max 3 doses. In no response call 911. 09/05/16  Yes Sherren Mocha, MD  Nutritional Supplements (FEEDING SUPPLEMENT, OSMOLITE 1.5 CAL,) LIQD Give 1 and 1/2 cans Osmolite 1.5 via PEG 4 times daily with 60 mL free water before and after bolus feedings. In addition, drink or flush PEG with 240 mL free water TID. Send formula and supplies. 06/19/16  Yes Gorsuch, Ni, MD  ondansetron (ZOFRAN) 8 MG tablet Take 1 tablet (8 mg total) by mouth every 8 (eight) hours as needed for nausea. 12/05/16  Yes Gorsuch, Ni, MD  predniSONE (DELTASONE) 5 MG tablet TAKE 2  TABLETS IN THE MORNING WITH BREAKFAST. 12/22/16  Yes [name], MD  promethazine (PHENERGAN) 25 MG tablet Take 1 tablet (25 mg total) by mouth every 6 (six) hours as needed for nausea. 12/05/16  Yes [name], MD  traMADol (ULTRAM) 50 MG tablet Take 1 tablet (50 mg total) by mouth every 6 (six) hours as needed for moderate pain. 01/11/17  Yes Gorsuch, Ni, MD  dronabinol (MARINOL) 2.5 MG capsule Take 1 capsule (2.5 mg  total) by mouth 2 (two) times daily before a meal. Patient not taking: Reported on 01/17/2017 12/17/16   Janith Lima, MD    Physical Exam: Vitals:   01/17/17 1853  BP: 136/75  Pulse: (!) 101  Resp: 18  Temp: 97.9 F (36.6 C)  TempSrc: Oral  SpO2: 98%      Constitutional: NAD, calm, comfortable, temporal wasting, cachectic appearing Vitals:   01/17/17 1853  BP: 136/75  Pulse: (!) 101  Resp: 18  Temp: 97.9 F (36.6 C)  TempSrc: Oral  SpO2: 98%   Eyes: PERRL, lids and conjunctivae normal ENMT: Mucous membranes are moist. Posterior pharynx clear of any exudate or lesions.Normal dentition.  Neck: normal, supple, no masses, no thyromegaly Respiratory: clear to auscultation bilaterally, no wheezing, no crackles. Normal respiratory effort. No accessory muscle use.  Cardiovascular: Regular rate and rhythm, no murmurs / rubs / gallops. No extremity edema. 2+ pedal pulses. No carotid bruits.  Abdomen: no tenderness, no masses palpated. No hepatosplenomegaly. Bowel sounds positive.  Abdominal wall redness noted with leakage around G site.  Dressing in place which is moist. Musculoskeletal: no clubbing / cyanosis. No joint deformity upper and lower extremities. Good ROM, no contractures. Normal muscle tone.  Skin: no rashes, lesions, ulcers. No induration Neurologic: CN 2-12 grossly intact. Sensation intact, DTR normal. Strength 5/5 in all 4.  Psychiatric: Normal judgment and insight. Alert and oriented x 3. Normal mood.     Labs on Admission: I have personally  reviewed following labs and imaging studies  CBC: Recent Labs  Lab 01/11/17 0947 01/17/17 1331  WBC 3.8* 1.5*  NEUTROABS 3.2 1.1*  HGB 8.9*  --   HCT 26.6* 29.1*  MCV 96.7 96.2  PLT 114* 778   Basic Metabolic Panel: Recent Labs  Lab 01/11/17 0947 01/17/17 1331  NA 134* 140  K 4.3 3.5  CL 97* 96*  CO2 30* 33*  GLUCOSE 323* 343*  BUN 22 33*  CREATININE 0.90  --   CALCIUM 8.9 9.4   GFR: Estimated Creatinine Clearance: 64.8 mL/min (by C-G formula based on SCr of 0.9 mg/dL). Liver Function Tests: Recent Labs  Lab 01/11/17 0947 01/17/17 1331  AST 13 12  ALT 8 8  ALKPHOS 70 72  BILITOT 0.5 1.1  PROT 6.0* 6.6  ALBUMIN 2.9* 3.3*   No results for input(s): LIPASE, AMYLASE in the last 168 hours. No results for input(s): AMMONIA in the last 168 hours. Coagulation Profile: No results for input(s): INR, PROTIME in the last 168 hours. Cardiac Enzymes: No results for input(s): CKTOTAL, CKMB, CKMBINDEX, TROPONINI in the last 168 hours. BNP (last 3 results) No results for input(s): PROBNP in the last 8760 hours. HbA1C: No results for input(s): HGBA1C in the last 72 hours. CBG: No results for input(s): GLUCAP in the last 168 hours. Lipid Profile: No results for input(s): CHOL, HDL, LDLCALC, TRIG, CHOLHDL, LDLDIRECT in the last 72 hours. Thyroid Function Tests: No results for input(s): TSH, T4TOTAL, FREET4, T3FREE, THYROIDAB in the last 72 hours. Anemia Panel: No results for input(s): VITAMINB12, FOLATE, FERRITIN, TIBC, IRON, RETICCTPCT in the last 72 hours. Urine analysis:    Component Value Date/Time   COLORURINE STRAW (A) 11/28/2016 1758   APPEARANCEUR CLEAR 11/28/2016 1758   LABSPEC 1.013 11/28/2016 1758   PHURINE 8.0 11/28/2016 1758   GLUCOSEU 50 (A) 11/28/2016 1758   GLUCOSEU NEGATIVE 05/16/2015 1621   HGBUR NEGATIVE 11/28/2016 1758   BILIRUBINUR NEGATIVE 11/28/2016 1758   KETONESUR NEGATIVE 11/28/2016 1758  PROTEINUR NEGATIVE 11/28/2016 1758   UROBILINOGEN  0.2 05/16/2015 1621   NITRITE NEGATIVE 11/28/2016 1758   LEUKOCYTESUR NEGATIVE 11/28/2016 1758   Sepsis Labs: !!!!!!!!!!!!!!!!!!!!!!!!!!!!!!!!!!!!!!!!!!!! @LABRCNTIP (procalcitonin:4,lacticidven:4) )No results found for this or any previous visit (from the past 240 hour(s)).   Radiological Exams on Admission: No results found.   Assessment/Plan Active Problems:   PEG tube malfunction (HCC)   G-tube malfunction Abdominal wall cellulitis -Admit the patient for further care and monitoring -We will keep the patient n.p.o., consult interventional radiology in the morning so the tube change can be addressed - We will keep him n.p.o., can restart his medications once okay to use PEG tube -Cultures ordered, routine labs ordered -IV vancomycin-pharmacy to dose -Provide supportive care  Moderate to severe protein calorie malnutrition Moderate dehydration Failure to thrive -Currently patient is n.p.o. due to malfunction of his G-tube.  Nutrition team has been consulted so his feeding can be started once the tube has been addressed -In the meantime continue IV fluids, monitor urine output  Buccal mucosa squamous cell carcinoma Dysphagia -Follows with Dr. Alvy Bimler, cont care per Onc  History of recent CVA - Only on Plavix due to previous history of GI bleeding.  Can continue statin and Plavix once okay with oral diet  Coronary artery disease status post CABG -Home medications on hold as he is n.p.o.  Appears to be stable.  Essential hypertension -We will treat with IV pain medications as needed.  Crohn's disease versus ulcerative colitis - No clear documentation of which one he has.  Continue supportive care.     DVT prophylaxis: SCDs Code Status: Partial code.  No intubation, CPR okay Family Communication: None at bedside Disposition Plan: To be determined Consults called: Will need interventional radiology consult in the morning Admission status: Inpatient  admission   Ankit Arsenio Loader MD [facility] Pager [phone]  If 7PM-7AM, please contact night-coverage [email or web address] Password [facility]  01/17/2017, 9:36 PM

## 2017-01-17 NOTE — Accreditation Note (Signed)
Pt is Dr. Sloan Leiter attending, pt was transferred from the cancer center. Pt currently have no orders and pt c/o pain.  Dr. Reesa Chew was notified and recommended to called floor manager for patient to be assigned. Judson Roch [facility] was notified awaiting respond. Pt stated he is comfortable but in pain.

## 2017-01-17 NOTE — Progress Notes (Signed)
Pharmacy Antibiotic Note  Andres Smith is a 77 y.o. male admitted on 01/17/2017 with cellulitis.  Pharmacy has been consulted for vancomycin dosing.  Plan: Vancomycin 1g IV q24 (goal AUC 400-500)     Temp (24hrs), Avg:97.9 F (36.6 C), Min:97.8 F (36.6 C), Max:97.9 F (36.6 C)  Recent Labs  Lab 01/11/17 0947 01/17/17 1331  WBC 3.8* 1.5*  CREATININE 0.90  --     Estimated Creatinine Clearance: 64.8 mL/min (by C-G formula based on SCr of 0.9 mg/dL).    No Known Allergies  Thank you for allowing pharmacy to be a part of this patient's care.  Adrian Saran, PharmD, BCPS Pager [phone] 01/17/2017 9:38 PM

## 2017-01-17 NOTE — Telephone Encounter (Signed)
TC from pt's wife. She states that pt's feeding is not working-food supplement coming out around feeding tube. She states that the tube has been replaced x2 per IR and is still not functioning well.  She states pt is not able take in adequate fluids at this point. She states he is getting weaker and she wants him to be seen in [facility]. He is scheduled to see Dr. Alvy Bimler tomorrow.  Discussed with Dr. Calton Dach nurse. OK for pt to be seen in [facility] with labs. High Priority scheduled request sent as well as lab orders.

## 2017-01-18 ENCOUNTER — Other Ambulatory Visit: Payer: PPO

## 2017-01-18 ENCOUNTER — Ambulatory Visit: Payer: PPO | Admitting: Hematology and Oncology

## 2017-01-18 ENCOUNTER — Other Ambulatory Visit: Payer: Self-pay

## 2017-01-18 ENCOUNTER — Encounter (HOSPITAL_COMMUNITY): Payer: Self-pay

## 2017-01-18 ENCOUNTER — Encounter: Payer: Self-pay | Admitting: General Practice

## 2017-01-18 ENCOUNTER — Encounter: Payer: PPO | Admitting: Nutrition

## 2017-01-18 ENCOUNTER — Ambulatory Visit: Payer: PPO

## 2017-01-18 DIAGNOSIS — C060 Malignant neoplasm of cheek mucosa: Secondary | ICD-10-CM

## 2017-01-18 DIAGNOSIS — R1310 Dysphagia, unspecified: Secondary | ICD-10-CM

## 2017-01-18 DIAGNOSIS — K9423 Gastrostomy malfunction: Principal | ICD-10-CM

## 2017-01-18 DIAGNOSIS — E876 Hypokalemia: Secondary | ICD-10-CM

## 2017-01-18 DIAGNOSIS — G893 Neoplasm related pain (acute) (chronic): Secondary | ICD-10-CM

## 2017-01-18 DIAGNOSIS — E43 Unspecified severe protein-calorie malnutrition: Secondary | ICD-10-CM

## 2017-01-18 LAB — COMPREHENSIVE METABOLIC PANEL
ALBUMIN: 2.7 g/dL — AB (ref 3.5–5.0)
ALK PHOS: 52 U/L (ref 38–126)
ALT: 9 U/L — AB (ref 17–63)
AST: 12 U/L — AB (ref 15–41)
Anion gap: 6 (ref 5–15)
BUN: 20 mg/dL (ref 6–20)
CALCIUM: 8.2 mg/dL — AB (ref 8.9–10.3)
CO2: 31 mmol/L (ref 22–32)
CREATININE: 0.62 mg/dL (ref 0.61–1.24)
Chloride: 99 mmol/L — ABNORMAL LOW (ref 101–111)
GFR calc Af Amer: >60 mL/min (ref >60)
GFR calc non Af Amer: >60 mL/min (ref >60)
GLUCOSE: 222 mg/dL — AB (ref 65–99)
Potassium: 3 mmol/L — ABNORMAL LOW (ref 3.5–5.1)
Sodium: 136 mmol/L (ref 135–145)
TOTAL PROTEIN: 5.3 g/dL — AB (ref 6.5–8.1)
Total Bilirubin: 0.6 mg/dL (ref 0.3–1.2)

## 2017-01-18 LAB — GLUCOSE, CAPILLARY
GLUCOSE-CAPILLARY: 190 mg/dL — AB (ref 65–99)
GLUCOSE-CAPILLARY: 171 mg/dL — AB (ref 65–99)
GLUCOSE-CAPILLARY: 105 mg/dL — AB (ref 65–99)
Glucose-Capillary: 212 mg/dL — ABNORMAL HIGH (ref 65–99)
Glucose-Capillary: 258 mg/dL — ABNORMAL HIGH (ref 65–99)

## 2017-01-18 MED ORDER — SODIUM CHLORIDE 0.9 % IV SOLN
INTRAVENOUS | Status: DC
  Administered 2017-01-18 (×2): via INTRAVENOUS
  Filled 2017-01-18 (×5): qty 1000

## 2017-01-18 MED ORDER — SODIUM CHLORIDE 0.9 % IV SOLN
INTRAVENOUS | Status: DC
  Administered 2017-01-18: 10:00:00 via INTRAVENOUS

## 2017-01-18 MED ORDER — POTASSIUM CHLORIDE 10 MEQ/100ML IV SOLN
10.0000 meq | INTRAVENOUS | Status: DC
  Filled 2017-01-18 (×5): qty 100

## 2017-01-18 MED ORDER — ENSURE ENLIVE PO LIQD
237.0000 mL | Freq: Three times a day (TID) | ORAL | Status: DC
  Administered 2017-01-18 – 2017-01-24 (×4): 237 mL via ORAL

## 2017-01-18 NOTE — Progress Notes (Addendum)
Patient ID: Andres Smith, male   DOB: Oct 18, 1940, 77 y.o.   MRN: [id number]  PROGRESS NOTE    Andres Smith  WYO:[id number] DOB: 05/13/1940 DOA: 01/17/2017 PCP: Janith Lima, MD   Brief Narrative: 77 year old male with history of buccal mucosa squamous cell carcinoma, dysphagia with G-tube in place, atrial fibrillation, CAD status post CABG, recent CVA was admitted to the hospital for evaluation of malfunction of his G-tube possible abdominal cellulitis.  Assessment & Plan:   Active Problems:   PEG tube malfunction (HCC)     G-tube malfunction with abdominal wall cellulitis -IR consulted.  Keep n.p.o.  -Continue vancomycin.  Follow cultures   Moderate to severe protein calorie malnutrition Moderate dehydration Failure to thrive -Currently patient is n.p.o. due to malfunction of his G-tube.  Nutrition team has been consulted so his feeding can be started once the tube has been addressed -Continue IV fluids  Buccal mucosa squamous cell carcinoma with dysphagia -Follows with Dr. Alvy Bimler  History of recent CVA - Only on Plavix due to previous history of GI bleeding.  Can continue statin and Plavix once okay with oral diet  Coronary artery disease status post CABG -Home medications on hold as he is n.p.o.  Appears to be stable.  Essential hypertension -Blood pressure stable.  treat with IV medications as needed.  Crohn's disease versus ulcerative colitis - No clear documentation of which one he has.  Continue supportive care.  Hypokalemia -Replace.  Repeat a.m. labs    DVT prophylaxis: SCDs Code Status: Partial code.  No intubation, CPR okay Family Communication: Spoke to family members at bedside Disposition Plan: Home in 1-2 days  Consultants: IR and oncology  Procedures: None  Antimicrobials: Vancomycin from 01/17/2017 onwards   Subjective: Patient seen and examined at bedside.  No overnight fever, nausea or vomiting.  Feels weak.  Objective: Vitals:   01/17/17 1853 01/17/17 2326 01/18/17 0543  BP: 136/75 118/68 117/67  Pulse: (!) 101 89 84  Resp: 18 20 20   Temp: 97.9 F (36.6 C) 98.2 F (36.8 C) (!) 97.2 F (36.2 C)  TempSrc: Oral Axillary Axillary  SpO2: 98% 95% 96%  Weight:  66.4 kg (146 lb 6.2 oz)   Height:  5\' 8"  (1.727 m)    No intake or output data in the 24 hours ending 01/18/17 1037 Filed Weights   01/17/17 2326  Weight: 66.4 kg (146 lb 6.2 oz)    Examination:  General exam: Appears calm and comfortable  Respiratory system: Bilateral decreased breath sound at bases Cardiovascular system: S1 & S2 heard, rate controlled Gastrointestinal system: Abdomen is nondistended, soft and nontender. Normal bowel sounds heard.  Abdominal wall tenderness with mild erythema around G-tube site Extremities: No cyanosis, clubbing, edema    Data Reviewed: I have personally reviewed following labs and imaging studies  CBC: Recent Labs  Lab 01/17/17 1331 01/17/17 2145  WBC 1.5* 2.2*  NEUTROABS 1.1* 1.2*  HGB  --  9.3*  HCT 29.1* 27.9*  MCV 96.2 96.2  PLT 183 774   Basic Metabolic Panel: Recent Labs  Lab 01/17/17 1331 01/17/17 2145 01/18/17 0525  NA 140 140 136  K 3.5 3.4* 3.0*  CL 96* 100* 99*  CO2 33* 32 31  GLUCOSE 343* 193* 222*  BUN 33* 26* 20  CREATININE  --  0.73 0.62  CALCIUM 9.4 9.1 8.2*  MG  --  1.9  --    GFR: Estimated Creatinine Clearance: 72.6 mL/min (by C-G formula based on SCr  of 0.62 mg/dL). Liver Function Tests: Recent Labs  Lab 01/17/17 1331 01/17/17 2145 01/18/17 0525  AST 12 17 12*  ALT 8 10* 9*  ALKPHOS 72 62 52  BILITOT 1.1 1.2 0.6  PROT 6.6 6.5 5.3*  ALBUMIN 3.3* 3.2* 2.7*   No results for input(s): LIPASE, AMYLASE in the last 168 hours. No results for input(s): AMMONIA in the last 168 hours. Coagulation Profile: No results for input(s): INR, PROTIME in the last 168 hours. Cardiac Enzymes: No results for input(s): CKTOTAL, CKMB, CKMBINDEX, TROPONINI in the last 168 hours. BNP  (last 3 results) No results for input(s): PROBNP in the last 8760 hours. HbA1C: No results for input(s): HGBA1C in the last 72 hours. CBG: Recent Labs  Lab 01/18/17 0622  GLUCAP 212*   Lipid Profile: No results for input(s): CHOL, HDL, LDLCALC, TRIG, CHOLHDL, LDLDIRECT in the last 72 hours. Thyroid Function Tests: No results for input(s): TSH, T4TOTAL, FREET4, T3FREE, THYROIDAB in the last 72 hours. Anemia Panel: No results for input(s): VITAMINB12, FOLATE, FERRITIN, TIBC, IRON, RETICCTPCT in the last 72 hours. Sepsis Labs: No results for input(s): PROCALCITON, LATICACIDVEN in the last 168 hours.  No results found for this or any previous visit (from the past 240 hour(s)).       Radiology Studies: No results found.      Scheduled Meds: . insulin aspart  0-9 Units Subcutaneous TID WC   Continuous Infusions: . sodium chloride 75 mL/hr at 01/18/17 1025  . vancomycin Stopped (01/18/17 0013)     LOS: 1 day        Aline August, MD [facility] Pager [phone]  If 7PM-7AM, please contact night-coverage [email or web address] Password [facility] 01/18/2017, 10:37 AM

## 2017-01-18 NOTE — Progress Notes (Signed)
Andres Smith   DOB:1940/09/10   WG#:[id number]    Subjective: The patient denies significant pain.  He continues to have leaking around the feeding tube with macerated skin around it.  He is miserable.  He had numerous recurrent evaluation by interventional radiologist with replacement of feeding tube due to leakage around it.   He was admitted to the hospital yesterday due to failure to thrive and dehydration.   Technically, he can swallow soft diet without choking.  However, he has poor appetite and was not eating enough by mouth prior to having feeding tube issues.  He is contemplating having the feeding tube removed permanently.  Objective:  Vitals:   01/18/17 0543 01/18/17 1442  BP: 117/67 121/72  Pulse: 84 93  Resp: 20 20  Temp: (!) 97.2 F (36.2 C) 98.4 F (36.9 C)  SpO2: 96% 98%    No intake or output data in the 24 hours ending 01/18/17 1756  GENERAL:alert, no distress and comfortable ABDOMEN:abdomen soft, non-tender and normal bowel sounds.  Noted leakage around the feeding tube with macerated skin Musculoskeletal:no cyanosis of digits and no clubbing  NEURO: alert & oriented x 3 with fluent speech, no focal motor/sensory deficits   Labs:  Lab Results  Component Value Date   WBC 2.2 (L) 01/17/2017   HGB 9.3 (L) 01/17/2017   HCT 27.9 (L) 01/17/2017   MCV 96.2 01/17/2017   PLT 162 01/17/2017   NEUTROABS 1.2 (L) 01/17/2017    Lab Results  Component Value Date   NA 136 01/18/2017   K 3.0 (L) 01/18/2017   CL 99 (L) 01/18/2017   CO2 31 01/18/2017    Assessment & Plan:  Buccal mucosa squamous cell carcinoma (HCC) He tolerated chemotherapy well except for progressive weight loss and lack of appetite  Due to decline of performance status, he is not a candidate for further chemotherapy right now.  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  He does not need G-CSF  support of blood transfusion  Cancer associated pain The patient has intermittent rib pain Low-dose tramadol appears to control his pain well For now, we will continue tramadol as needed for pain  Dysphagia, leaking around feeding tube Dysphagia is stable He is able to swallow food but not motivated eat enough and maintain his caloric intake. Over the past 3 months, I have spent numerous hours encouraging him to eat. With the leaking around the feeding tube, we have discussed options. With jejunostomy tube, he would need nutritional feedings through a continuous pump. The leak around the feeding tube is unlikely going to resolve. Without adequate nutrition and progressive weight loss, he may not be able to receive further chemotherapy. On the other hand, if we remove the feeding tube, it will be a permanent discontinuation of enteral feeding.  There would be significant technical challenges to place a new feeding tube if he regretted his decision.  I would not recommend TPN feedings long-term. I have spent over 30 minutes face-to-face just to discuss the options, weighing in risk and benefit of each options Ultimately, the patient has made informed decision to proceed with feeding tube removal. He would be at significant risk of refeeding syndrome even after feeding tube is removed.  He would need to be monitored closely with parenteral electrolyte replacement therapy after feeding tube is removed. I will place the order and I have informed the hospitalist about his  decision. I will return to check on him next week.  All questions were answered. The patient knows to call the clinic with any problems, questions or concerns. No barriers to learning was detected. I spent 35 minutes counseling the patient face to face.   [name], MD 01/18/2017  5:56 PM

## 2017-01-18 NOTE — Progress Notes (Signed)
Patient ID: Andres Smith, male   DOB: 05-11-1940, 77 y.o.   MRN: [id number] Patient seen by Dr. Anselm Pancoast on 01-15-17 to have his GJ tube transitioned to a larger tube.  This was upsized from a 40F to a 51F GJ tube.  Dr. Anselm Pancoast and I thoroughly discussed with the patient and his wife to limit his PO intake for now and to preferentially use the J tube for feedings to minimize contents in the stomach to hopefully decrease his leakage.  His G portion was still supposed to be used for medications, but otherwise his J-tube was to be used for feeding.  Unfortunately, this tube is close to the largest tube there is (largest is a 37F I believe) but continuing to upsize just puts him at greater risk for further dilatation of his tract and therefore further leakage.  There are no other great options for him at this point regarding his tube leaking.  You could continue to try to limit oral intake or use of the g-tube except medications and just use the J-tube portion to allow time for this tract to try and contract down to decrease leakage.    Henreitta Cea 2:05 PM 01/18/2017

## 2017-01-18 NOTE — Progress Notes (Signed)
I spoke to Tiffany  In IR to inquire about the plan for Mr. Ridgely today. She stated that a PA  Would see him today. I have conveyed this to Mr Memmer

## 2017-01-18 NOTE — Progress Notes (Deleted)
Electrophysiology Office Note Date: 01/18/2017  ID:  Andres Smith, DOB October 18, 1940, MRN [id number]  PCP: Janith Lima, MD Primary Cardiologist: *** Electrophysiologist: ***  CC: ***  Andres Smith is a 77 y.o. male seen today for ***.  {He/she (caps):30048} presents today for routine electrophysiology followup.  Since last being seen in our clinic, the patient reports doing very well.  {He/she (caps):30048} denies chest pain, palpitations, dyspnea, PND, orthopnea, nausea, vomiting, dizziness, syncope, edema, weight gain, or early satiety.  Past Medical History:  Diagnosis Date  . Ankylosing spondylitis ([place])   . Blood transfusion    hx of transfusion without reaction  . Bradycardia   . Cancer ([place]) 03/2016   oral  . Cholelithiasis   . Coronary artery disease    a. s/p CABG x 6 in 1997 (LIMA->LAD, VG->RI ->OM1->OM2, VG->PDA->PLV // b. 05/2011 Cath:  patent grafs, native prox rca and d2 dzs  ->med rx. // c. Myoview 1/18: not gated, large inferolateral scar, no ischemia; Intermediate Risk (IL scar old - on prior studies >> med rx)  . Crohn's colitis ([place]) 12/01/1997  . Diabetes mellitus   . Dysphagia   . Fatty liver   . GERD (gastroesophageal reflux disease)   . Heart block   . Hiatal hernia 12/03/2008  . History of radiation therapy 05/23/16- 07/09/16   Left Cheek/ 60 Gy in 30 fractions to gross disease, 63 Gy in 35 fractions to high risk nodal echelons, and 56 Gy in 35 fractions to intermedicate risk nodal echelons.   . History of skin cancer   . Hx of echocardiogram    Echo 5/14:  Mild LVH, EF 55-60%, NL diast fxn, mild LAE   . Hyperlipidemia   . Hypertension   . Kidney stones   . Neck pain   . Pneumonia    hx of PNA  . 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 Stroke ([place]) 11/2016  . 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 cancer removal  2014   removed from left outter leg  . COLONOSCOPY   2012  . CORONARY ARTERY BYPASS GRAFT  04/19/1995   x7  . EXCISION ORAL TUMOR Left    left jaw and lymph node  . IR FLUORO GUIDE PORT INSERTION RIGHT  05/21/2016  . IR GASTR TUBE CONVERT GASTR-JEJ PER W/FL MOD SED  01/15/2017  . IR GASTROSTOMY TUBE MOD SED  05/21/2016  . IR RADIOLOGIST EVAL & MGMT  01/09/2017  . IR REPLC GASTRO/COLONIC TUBE PERCUT W/FLUORO  01/14/2017  . IR US GUIDE VASC ACCESS RIGHT  05/21/2016  . LEFT HEART CATHETERIZATION WITH CORONARY/GRAFT ANGIOGRAM N/A 05/24/2011   Procedure: LEFT HEART CATHETERIZATION WITH Beatrix Fetters;  Surgeon: Larey Dresser, MD;  Location: [facility] C[facility] LAB;  Service: Cardiovascular;  Laterality: N/A;  . MOHS SURGERY     X 2 off chin and nose  . TONSILLECTOMY AND ADENOIDECTOMY  1944  . TRIGGER FINGER RELEASE     x 2  . UMBILICAL HERNIA REPAIR      No current facility-administered medications for this visit.    No current outpatient medications on file.   Facility-Administered Medications Ordered in Other Visits  Medication Dose Route Frequency Provider Last Rate Last Dose  . acetaminophen (TYLENOL) tablet 650 mg  650 mg Oral Q6H PRN Amin, Ankit Chirag, MD       Or  . acetaminophen (TYLENOL) suppository 650 mg  650 mg Rectal Q6H PRN Amin, Ankit Chirag, MD      . albuterol (PROVENTIL) (2.5 MG/3ML) 0.083% nebulizer solution 2.5 mg  2.5 mg Nebulization Q6H PRN Amin, Ankit Chirag, MD      . feeding supplement (ENSURE ENLIVE) (ENSURE ENLIVE) liquid 237 mL  237 mL Oral TID BM Alekh, Kshitiz, MD   237 mL at 01/18/17 1740  . insulin aspart (novoLOG) injection 0-9 Units  0-9 Units Subcutaneous TID WC Amin, Jeanella Flattery, MD   2 Units at 01/18/17 1217  . morphine 2 MG/ML injection 1 mg  1 mg Intravenous Q4H PRN Amin, Ankit Chirag, MD   1 mg at 01/18/17 1655  . ondansetron (ZOFRAN) tablet 4 mg  4 mg Oral Q6H PRN Amin, Ankit Chirag, MD       Or  . ondansetron (ZOFRAN) injection 4 mg  4 mg Intravenous Q6H PRN Amin, Ankit Chirag, MD      . sodium  chloride 0.9 % 1,000 mL with potassium chloride 60 mEq infusion   Intravenous Continuous Starla Link, Kshitiz, MD 100 mL/hr at 01/18/17 1259    . vancomycin (VANCOCIN) IVPB 1000 mg/200 mL premix  1,000 mg Intravenous Q24H Adrian Saran, [facility]   Stopped at 01/18/17 0013    Allergies:   Patient has no known allergies.   Social History: Social History   Socioeconomic History  . Marital status: Married    Spouse name: Not on file  . Number of children: 2  . Years of education: Not on file  . Highest education level: Not on file  Social Needs  . Financial resource strain: Not on file  . Food insecurity - worry: Not on file  . Food insecurity - inability: Not on file  . Transportation needs - medical: Not on file  . Transportation needs - non-medical: Not on file  Occupational History  . Occupation: semi retired    Fish farm manager: SELF EMPLOYED  Tobacco Use  . Smoking status: Former Smoker    Last attempt to quit: 01/02/1983    Years since quitting: 34.0  . Smokeless tobacco: Former Systems developer    Types: [place] date: 01/01/2010  Substance and Sexual Activity  . Alcohol use: Yes    Comment: tottie every night  . Drug use: No  . Sexual activity: Not Currently  Other Topics Concern  . Not on file  Social History Narrative   HSG, [employer] - Civil engineering/education in industrial, [place] from [place]. Married '64. 1 son '74, 1 dtr - '69; 1 grandchild, 1 on the way. 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 Lives with wife. ACP -             Family History: Family History  Problem Relation Age of Onset  . Heart disease Father   . Heart disease Mother   . Arthritis Mother   . Heart disease Unknown   . Crohn's disease Unknown   . Heart disease Sister   . Colon cancer Paternal Grandmother     Review  of Systems: All other systems reviewed and are otherwise negative except as noted above.   Physical Exam: VS:  There were no vitals taken for this visit. , BMI There is no height or weight on file to calculate BMI. Wt Readings from Last 3 Encounters:  01/17/17 146 lb  6.2 oz (66.4 kg)  01/17/17 147 lb 2 oz (66.7 kg)  01/11/17 158 lb 11.2 oz (72 kg)    GEN- The patient is well appearing, alert and oriented x 3 today.   HEENT: normocephalic, atraumatic; sclera clear, conjunctiva pink; hearing intact; oropharynx clear; neck supple, no JVP Lymph- no cervical lymphadenopathy Lungs- Clear to ausculation bilaterally, normal work of breathing.  No wheezes, rales, rhonchi Heart- Regular rate and rhythm, no murmurs, rubs or gallops, PMI not laterally displaced GI- soft, non-tender, non-distended, bowel sounds present, no hepatosplenomegaly Extremities- no clubbing, cyanosis, or edema; DP/PT/radial pulses 2+ bilaterally MS- no significant deformity or atrophy Skin- warm and dry, no rash or lesion  Psych- euthymic mood, full affect Neuro- strength and sensation are intact   EKG:  EKG {ACTION; IS/IS XAJ:[id number]} ordered today. The ekg ordered today shows ***  Recent Labs: 10/10/2016: TSH 1.971 01/17/2017: Hemoglobin 9.3; Magnesium 1.9; Platelets 162 01/18/2017: ALT 9; BUN 20; Creatinine, Ser 0.62; Potassium 3.0; Sodium 136    Other studies Reviewed: Additional studies/ records that were reviewed today include: ***  Review of the above records today demonstrates: ***  Assessment and Plan:  ***   Current medicines are reviewed at length with the patient today.   The patient {ACTIONS; HAS/DOES NOT HAVE:19233} concerns regarding his medicines.  The following changes were made today:  {NONE DEFAULTED:18576::"none"}  Labs/ tests ordered today include: *** No orders of the defined types were placed in this encounter.    Disposition:   Follow up with *** {gen number [id number]} {TIME;  UNITS DAY/WEEK/MONTH:19136}   Signed, Chanetta Marshall, NP 01/18/2017 7:21 PM   [place] [address] [phone] (office) [phone] (fax)

## 2017-01-18 NOTE — Evaluation (Addendum)
SLP Cancellation Note  Patient Details Name: OLUWATOBI VISSER MRN: [id number] DOB: Apr 12, 1940   Cancelled treatment:       Reason Eval/Treat Not Completed: Other (comment);Medical issues which prohibited therapy(medical issues noted, pt with leak from around the peg tube with and without intake.    Will follow up for readiness for po, feeding tube issue resolved, of note pt and wife reports patient is not eating enough by mouth for nutritional support and takes tube feeding, will follow up - md paged and made aware of concern)   Macario Golds 01/18/2017, 12:23 PM  Luanna Salk, [place] [place] [facility] SLP [phone]'

## 2017-01-18 NOTE — Progress Notes (Signed)
Brief swallow evaluation completed, pt seen with water consumption only due to concerns with PEG leakage and IR PA note indicating option to limit po or G tube to medication only and use J tube.    Pt has h/o chronic dysphagia and reports approximately 10% of nutrition is po with 90% being via PEG tube.  He is aware of his dysphagia and compensates well for it.   Pt follows his aspiration precautions/dysphagia compensations -eg:  swallow multiple times with boluses, using liquids to aid oral transit, consuming moist soft foods, etc.  As pt reports tolerating po PTA independently using his precautions and he had not had pneumonias, recommend diet as tolerated when MD indicates appropriate.    Andres Smith, [place] [facility] SLP [phone]

## 2017-01-18 NOTE — Progress Notes (Addendum)
Initial Nutrition Assessment  DOCUMENTATION CODES:   Severe malnutrition in context of chronic illness  INTERVENTION:    Monitor for clinical plan  Ensure Enlive po TID, each supplement provides 350 kcal and 20 grams of protein once advanced  If tube feeding to be continued follow at home regimen of: 6 bottles Osmolite 1.5 (240 ml) per day via J tube This provides 2130 kcal, 89 g protein, 1086 ml free water.   NUTRITION DIAGNOSIS:   Severe Malnutrition related to chronic illness, cancer and cancer related treatments as evidenced by 9.3% weight loss in one month, moderate fat depletion, severe fat depletion, severe muscle depletion.  GOAL:   Patient will meet greater than or equal to 90% of their needs   MONITOR:   Supplement acceptance, PO intake, Diet advancement, TF tolerance, Weight trends, Labs, I & O's  REASON FOR ASSESSMENT:   Consult Enteral/tube feeding initiation and management  ASSESSMENT:   Pt with PMH significant for buccal mucosa squamous cell carcinoma (chemo/radiation), dysphagia, s/p GJ tube placement May 2018, CAD, DM, HLD, and HTN. Presents this admission with GJ tube malfunction. Surgery notes pt had GJ tube upsize from 61F to 21F 1/15, but it continues to leak around the site.    Spoke with pt at bedside. Pt is followed by [place] RD. Pt reports not having any tube feeding for the last three days due to ongoing drainage around his GJ tube site. States he would consume bites of custard pie without the crust and milkshakes his wife makes him. Intake is not adequate to meet current needs per wife. Pt has Ensure at home but pushes them through the tube versus taking them PO. There was discussion of GJ tube possibly being removed. Discussed with the pt that he would have to consume more PO than he is now to meet his needs without tube feeding. Plan is to limit PO intake for now to minimize stomach contents and to possibly use the J tube portion for  feedings. Surgery notes: Upsizing tube even further puts him at risk for further dilation.   Pt states he has to drink/eat slowly and swallow multiple times. Discussed with radiology PA, pt may need swallow evaluation. SLP to see today.   Weight records indicate pt weighed 161 lb 12/16/16 and 146 lb this admission. This shows a 9.3% wt loss in one month. Significant for time frame. Pt confirms weight readings. Nutrition-Focused physical exam completed.   Medications reviewed and include: SSI, NS with 60 mEq KCl @ 100 ml/hr, IV abx Labs reviewed: K 3.0 (L) CBG 193-343 (H) AST 12 (L) ALT 9 (L)  NUTRITION - FOCUSED PHYSICAL EXAM:    Most Recent Value  Orbital Region  Moderate depletion  Upper Arm Region  Severe depletion  Thoracic and Lumbar Region  Unable to assess  Buccal Region  Severe depletion  Temple Region  Moderate depletion  Clavicle Bone Region  Severe depletion  Clavicle and Acromion Bone Region  Severe depletion  Scapular Bone Region  Unable to assess  Dorsal Hand  Severe depletion  Patellar Region  Severe depletion  Anterior Thigh Region  Severe depletion  Posterior Calf Region  Severe depletion  Edema (RD Assessment)  None  Hair  Reviewed  Eyes  Reviewed  Mouth  Reviewed  Skin  Reviewed  Nails  Reviewed       Diet Order:  Diet NPO time specified  EDUCATION NEEDS:   Education needs have been addressed  Skin:  Skin Assessment:  Reviewed RN Assessment  Last BM:  PTA  Height:   Ht Readings from Last 1 Encounters:  01/17/17 5\' 8"  (1.727 m)    Weight:   Wt Readings from Last 1 Encounters:  01/17/17 146 lb 6.2 oz (66.4 kg)    Ideal Body Weight:  70 kg  BMI:  Body mass index is 22.26 kg/m.  Estimated Nutritional Needs:   Kcal:  0929-5747 kcal/day  Protein:  110-120 g/day  Fluid:  >2.3 L/day    Mariana Single RD, LDN Clinical Nutrition Pager # - [phone]

## 2017-01-18 NOTE — Evaluation (Addendum)
Clinical/Bedside Swallow Evaluation Patient Details  Name: Andres Smith MRN: [id number] Date of Birth: 14-Dec-1940  Today's Date: 01/18/2017 Time: SLP Start Time (ACUTE ONLY): 1414 SLP Stop Time (ACUTE ONLY): 1440 SLP Time Calculation (min) (ACUTE ONLY): 26 min  Past Medical History:  Past Medical History:  Diagnosis Date  . Ankylosing spondylitis ([place])   . Blood transfusion    hx of transfusion without reaction  . Bradycardia   . Cancer ([place]) 03/2016   oral  . Cholelithiasis   . Coronary artery disease    a. s/p CABG x 6 in 1997 (LIMA->LAD, VG->RI ->OM1->OM2, VG->PDA->PLV // b. 05/2011 Cath:  patent grafs, native prox rca and d2 dzs  ->med rx. // c. Myoview 1/18: not gated, large inferolateral scar, no ischemia; Intermediate Risk (IL scar old - on prior studies >> med rx)  . Crohn's colitis ([place]) 12/01/1997  . Diabetes mellitus   . Dysphagia   . Fatty liver   . GERD (gastroesophageal reflux disease)   . Heart block   . Hiatal hernia 12/03/2008  . History of radiation therapy 05/23/16- 07/09/16   Left Cheek/ 60 Gy in 30 fractions to gross disease, 63 Gy in 35 fractions to high risk nodal echelons, and 56 Gy in 35 fractions to intermedicate risk nodal echelons.   . History of skin cancer   . Hx of echocardiogram    Echo 5/14:  Mild LVH, EF 55-60%, NL diast fxn, mild LAE   . Hyperlipidemia   . Hypertension   . Kidney stones   . Neck pain   . Pneumonia    hx of PNA  . 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 Stroke ([place]) 11/2016  . 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 cancer removal  2014   removed from left outter leg  . COLONOSCOPY  2012  . CORONARY ARTERY BYPASS GRAFT  04/19/1995   x7  . EXCISION ORAL TUMOR Left    left jaw and lymph node  . IR FLUORO GUIDE PORT INSERTION RIGHT  05/21/2016  . IR GASTR TUBE CONVERT GASTR-JEJ PER W/FL MOD SED  01/15/2017  . IR GASTROSTOMY  TUBE MOD SED  05/21/2016  . IR RADIOLOGIST EVAL & MGMT  01/09/2017  . IR REPLC GASTRO/COLONIC TUBE PERCUT W/FLUORO  01/14/2017  . IR US GUIDE VASC ACCESS RIGHT  05/21/2016  . LEFT HEART CATHETERIZATION WITH CORONARY/GRAFT ANGIOGRAM N/A 05/24/2011   Procedure: LEFT HEART CATHETERIZATION WITH Beatrix Fetters;  Surgeon: Larey Dresser, MD;  Location: [facility] CATH LAB;  Service: Cardiovascular;  Laterality: N/A;  . MOHS SURGERY     X 2 off chin and nose  . TONSILLECTOMY AND ADENOIDECTOMY  1944  . TRIGGER FINGER RELEASE     x 2  . UMBILICAL HERNIA REPAIR     HPI:  77 yo male adm to [facility] with PEG tube malfunction.  PMH + for ankylosing spondylosis, oral cancer s/p XRT 02/24/16-07/09/16 to left cheek, ulcerative colitis, GERD, kidney stones, CVA impacting occipital region, pna.  Pt has been having problems with his feeding tube and swallow evaluation ordered to assure pt with intact swallow ability.  Pt is reporting problems with leakage around PEG tube with po water, po or PEG administered medication and water. He reports he consumes 10% of his nutrition due to being "lazy" and receiving 90% via feeding tube.    Assessment / Plan / Recommendation Clinical Impression  Limited assessment due to pt's limited ability to consume po due to PEG leakage.  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  Observation of intake with water clinically appeared functional - pt had sensation to laryngeal penetration of thin on prior MBS 08/06/2016.   Pt reports swallow ability now improved compared to during prior MBS *pt has just finished radiation tx 07/2016.    Recommend po-diet as tolerated (when MD approves po due to PEG leakage) due to pt's keen awareness of his dysphagia and self compensation. Pt states he eats very soft items *eg custard, green beans and uses coffee to aid oral transit.   Concern for pt's ability to meet nutrition present as he reports he only consumes  10% by mouth.  Son also expressed concern regarding nutrition.    Advised pt and son to ability to meet nutritional needs via liquids alone if he has difficulty with solids/purees and to follow up with dietician to maximize po (when able).   Will follow up x1 next week to determine how pt tolerating if he is able to consume po diet.  Functional meal observation will be attempted if indicated.  Thanks.   SLP Visit Diagnosis: Dysphagia, oral phase (R13.11)    Aspiration Risk  Mild aspiration risk    Diet Recommendation Other (Comment)(when md indicates, diet as tolerated)   Medication Administration: (when able- liquid form, crushed -? IV for now?) Supervision: Patient able to self feed Compensations: Minimize environmental distractions;Slow rate;Small sips/bites;Multiple dry swallows after each bite/sip Postural Changes: Seated upright at 90 degrees;Remain upright for at least 30 minutes after po intake    Other  Recommendations Oral Care Recommendations: Oral care before and after PO   Follow up Recommendations   tbd     Frequency and Duration min 1 x/week  1 week       Prognosis Prognosis for Safe Diet Advancement: Fair      Swallow Study   General Date of Onset: 01/18/17 HPI: 77 yo male adm to [facility] with PEG tube malfunction.  PMH + for ankylosing spondylosis, oral cancer s/p XRT 02/24/16-07/09/16 to left cheek, ulcerative colitis, GERD, kidney stones, CVA impacting occipital region, pna.  Pt has been having problems with his feeding tube and swallow evaluation ordered to assure pt with intact swallow ability.  Type of Study: Bedside Swallow Evaluation Diet Prior to this Study: NPO Temperature Spikes Noted: No Respiratory Status: Room air History of Recent Intubation: No Behavior/Cognition: Alert;Cooperative Oral Cavity Assessment: Other (comment)(minimal oral opening,  trismus due to radiation) Oral Care Completed by SLP: No Oral Cavity - Dentition: Dentures, top;Dentures,  bottom Vision: Functional for self-feeding Self-Feeding Abilities: Able to feed self Patient Positioning: Upright in bed Baseline Vocal Quality: Normal Volitional Cough: Weak(pt reports not as strong as normal) Volitional Swallow: Unable to elicit(due to xerostomia)    Oral/Motor/Sensory Function Overall Oral Motor/Sensory Function: Mild impairment Facial ROM: Other (Comment)(reduced) Facial Symmetry: Within Functional Limits Facial Strength: (pt can seal lips on straw) Lingual ROM: Other (Comment)(reduced anterior protrusion) Lingual Symmetry: Within Functional Limits Lingual Sensation: (dnt) Mandible: Impaired(significant trismus)   Ice Chips Ice chips: Within functional limits Presentation: Spoon   Thin Liquid Thin Liquid: Within functional limits Presentation: Self Fed    Nectar Thick Nectar Thick Liquid: Not tested   Honey Thick Honey Thick Liquid: Not tested   Puree Puree: Not tested   Solid   GO   Solid: Not tested  Macario Golds 01/18/2017,4:11 PM  Luanna Salk, [place] [facility] SLP [phone]

## 2017-01-18 NOTE — Progress Notes (Signed)
Andres Smith Spiritual Care Note  Visited Andres Smith inpt with Andres Smith, Andres Smith, bringing prayer shawls for him and wife Andres Smith as tangible signs of support and encouragement. Per Pamala Hurry, who followed up with Andres Smith by phone, Andres Smith plans to call me as timing allows to reschedule Spiritual Care conversation. Following for support, but please also page as needs arise or circumstances change. Thank you.   [place], [place], [facility] M-F daytime pager [phone] [facility] 24/7 pager [phone] Voicemail [phone]

## 2017-01-18 NOTE — Progress Notes (Signed)
Dr Lottie Rater notified that Andres Smith and his family has made a decision to remove to g/j tube

## 2017-01-19 DIAGNOSIS — E43 Unspecified severe protein-calorie malnutrition: Secondary | ICD-10-CM

## 2017-01-19 DIAGNOSIS — Z4589 Encounter for adjustment and management of other implanted devices: Secondary | ICD-10-CM | POA: Diagnosis not present

## 2017-01-19 HISTORY — PX: IR GASTROSTOMY TUBE REMOVAL: IMG5492

## 2017-01-19 LAB — CBC WITH DIFFERENTIAL/PLATELET
BASOS ABS: 0 10*3/uL (ref 0.0–0.1)
Basophils Relative: 0 %
EOS PCT: 1 %
Eosinophils Absolute: 0 10*3/uL (ref 0.0–0.7)
HEMATOCRIT: 24.6 % — AB (ref 39.0–52.0)
Hemoglobin: 8.2 g/dL — ABNORMAL LOW (ref 13.0–17.0)
LYMPHS ABS: 0.5 10*3/uL — AB (ref 0.7–4.0)
LYMPHS PCT: 23 %
MCH: 32.3 pg (ref 26.0–34.0)
MCHC: 33.3 g/dL (ref 30.0–36.0)
MCV: 96.9 fL (ref 78.0–100.0)
MONO ABS: 0.4 10*3/uL (ref 0.1–1.0)
Monocytes Relative: 17 %
NEUTROS ABS: 1.4 10*3/uL — AB (ref 1.7–7.7)
Neutrophils Relative %: 59 %
PLATELETS: 135 10*3/uL — AB (ref 150–400)
RBC: 2.54 MIL/uL — ABNORMAL LOW (ref 4.22–5.81)
RDW: 16.2 % — AB (ref 11.5–15.5)
WBC: 2.3 10*3/uL — ABNORMAL LOW (ref 4.0–10.5)

## 2017-01-19 LAB — MAGNESIUM: MAGNESIUM: 1.5 mg/dL — AB (ref 1.7–2.4)

## 2017-01-19 LAB — COMPREHENSIVE METABOLIC PANEL
ALT: 8 U/L — AB (ref 17–63)
AST: 12 U/L — AB (ref 15–41)
Albumin: 2.5 g/dL — ABNORMAL LOW (ref 3.5–5.0)
Alkaline Phosphatase: 52 U/L (ref 38–126)
Anion gap: 4 — ABNORMAL LOW (ref 5–15)
BILIRUBIN TOTAL: 0.5 mg/dL (ref 0.3–1.2)
BUN: 13 mg/dL (ref 6–20)
CHLORIDE: 105 mmol/L (ref 101–111)
CO2: 27 mmol/L (ref 22–32)
CREATININE: 0.58 mg/dL — AB (ref 0.61–1.24)
Calcium: 8.3 mg/dL — ABNORMAL LOW (ref 8.9–10.3)
Glucose, Bld: 141 mg/dL — ABNORMAL HIGH (ref 65–99)
Potassium: 4 mmol/L (ref 3.5–5.1)
Sodium: 136 mmol/L (ref 135–145)
TOTAL PROTEIN: 5.3 g/dL — AB (ref 6.5–8.1)

## 2017-01-19 LAB — GLUCOSE, CAPILLARY
GLUCOSE-CAPILLARY: 146 mg/dL — AB (ref 65–99)
GLUCOSE-CAPILLARY: 143 mg/dL — AB (ref 65–99)
Glucose-Capillary: 169 mg/dL — ABNORMAL HIGH (ref 65–99)
Glucose-Capillary: 143 mg/dL — ABNORMAL HIGH (ref 65–99)

## 2017-01-19 MED ORDER — MORPHINE SULFATE (PF) 2 MG/ML IV SOLN
2.0000 mg | INTRAVENOUS | Status: DC | PRN
  Administered 2017-01-19 – 2017-01-21 (×10): 2 mg via INTRAVENOUS
  Filled 2017-01-19 (×11): qty 1

## 2017-01-19 MED ORDER — SODIUM CHLORIDE 0.9 % IV SOLN
INTRAVENOUS | Status: DC
  Administered 2017-01-19 – 2017-01-22 (×2): via INTRAVENOUS

## 2017-01-19 MED ORDER — MAGNESIUM SULFATE 2 GM/50ML IV SOLN
2.0000 g | Freq: Once | INTRAVENOUS | Status: AC
  Administered 2017-01-19: 2 g via INTRAVENOUS
  Filled 2017-01-19: qty 50

## 2017-01-19 NOTE — Progress Notes (Signed)
Patient ID: Andres Smith, male   DOB: [birth year], 77 y.o.   MRN: [id number]  PROGRESS NOTE    Andres Smith  JXB:[id number] DOB: August 05, 1940 DOA: 01/17/2017 PCP: Janith Lima, MD   Brief Narrative: 77 year old male with history of buccal mucosa squamous cell carcinoma, dysphagia with G-tube in place, atrial fibrillation, CAD status post CABG, recent CVA was admitted to the hospital for evaluation of malfunction of his G-tube possible abdominal cellulitis. IR and oncology were consulted.  Assessment & Plan:   Active Problems:   PEG tube malfunction (HCC)   Protein-calorie malnutrition, severe     G-tube malfunction with abdominal wall cellulitis -Patient has decided that he wants the G-tube removed as per discussion with Dr. Alvy Bimler. IR consulted.    -Continue vancomycin.  Follow cultures   Moderate to severe protein calorie malnutrition Moderate dehydration Failure to thrive -Patient tried liquid diet last night but everything leaked out of the tube.  We will keep the patient n.p.o. for now until further IR evaluation. -Continue IV fluids -Patient might need TPN, will wait for further recommendations from Dr. 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 Alvy Bimler  History of recent CVA - Only on Plavix due to previous history of GI bleeding.  Can continue statin and Plavix once okay with oral diet  Coronary artery disease status post CABG -Stable.  Essential hypertension -Blood pressure stable.  treat with IV medications as needed.  Crohn's disease versus ulcerative colitis - No clear documentation of which one he has.  Continue supportive care.  Hypokalemia -Resolved  Hypomagnesemia -Replace.  Repeat a.m. labs    DVT prophylaxis: SCDs Code Status: Partial code.  No intubation, CPR okay Family Communication: Spoke to wife at bedside Disposition Plan: Home once clinically improves  Consultants: IR and oncology  Procedures:  None  Antimicrobials: Vancomycin from 01/17/2017 onwards   Subjective: Patient seen and examined at bedside.  No overnight fever, nausea or vomiting.  Feels weak.  States that he tried liquid diet last night which leaked out through his tube.  Objective: Vitals:   01/18/17 0543 01/18/17 1442 01/18/17 2128 01/19/17 0434  BP: 117/67 121/72 134/75 123/69  Pulse: 84 93 84 (!) 104  Resp: 20 20 16 14   Temp: (!) 97.2 F (36.2 C) 98.4 F (36.9 C) 98 F (36.7 C) 98.8 F (37.1 C)  TempSrc: Axillary Oral Oral Oral  SpO2: 96% 98% 97% 96%  Weight:      Height:        Intake/Output Summary (Last 24 hours) at 01/19/2017 1208 Last data filed at 01/19/2017 0900 Gross per 24 hour  Intake 0 ml  Output 700 ml  Net -700 ml   Filed Weights   01/17/17 2326  Weight: 66.4 kg (146 lb 6.2 oz)    Examination:  General exam: Appears calm and comfortable.  No distress Respiratory system: Bilateral decreased breath sound at bases Cardiovascular system: S1 & S2 heard, rate controlled Gastrointestinal system: Abdomen is nondistended, soft and nontender. Normal bowel sounds heard.  Abdominal wall tenderness with mild erythema around G-tube site Extremities: No cyanosis, clubbing, edema    Data Reviewed: I have personally reviewed following labs and imaging studies  CBC: Recent Labs  Lab 01/17/17 1331 01/17/17 2145 01/19/17 0700  WBC 1.5* 2.2* 2.3*  NEUTROABS 1.1* 1.2* 1.4*  HGB  --  9.3* 8.2*  HCT 29.1* 27.9* 24.6*  MCV 96.2 96.2 96.9  PLT 183 162 130*   Basic Metabolic  Panel: Recent Labs  Lab 01/17/17 1331 01/17/17 2145 01/18/17 0525 01/19/17 0700  NA 140 140 136 136  K 3.5 3.4* 3.0* 4.0  CL 96* 100* 99* 105  CO2 33* 32 31 27  GLUCOSE 343* 193* 222* 141*  BUN 33* 26* 20 13  CREATININE  --  0.73 0.62 0.58*  CALCIUM 9.4 9.1 8.2* 8.3*  MG  --  1.9  --  1.5*   GFR: Estimated Creatinine Clearance: 72.6 mL/min (A) (by C-G formula based on SCr of 0.58 mg/dL (L)). Liver Function  Tests: Recent Labs  Lab 01/17/17 1331 01/17/17 2145 01/18/17 0525 01/19/17 0700  AST 12 17 12* 12*  ALT 8 10* 9* 8*  ALKPHOS 72 62 52 52  BILITOT 1.1 1.2 0.6 0.5  PROT 6.6 6.5 5.3* 5.3*  ALBUMIN 3.3* 3.2* 2.7* 2.5*   No results for input(s): LIPASE, AMYLASE in the last 168 hours. No results for input(s): AMMONIA in the last 168 hours. Coagulation Profile: No results for input(s): INR, PROTIME in the last 168 hours. Cardiac Enzymes: No results for input(s): CKTOTAL, CKMB, CKMBINDEX, TROPONINI in the last 168 hours. BNP (last 3 results) No results for input(s): PROBNP in the last 8760 hours. HbA1C: No results for input(s): HGBA1C in the last 72 hours. CBG: Recent Labs  Lab 01/18/17 1149 01/18/17 1722 01/18/17 2108 01/19/17 0724 01/19/17 1132  GLUCAP 171* 105* 258* 146* 169*   Lipid Profile: No results for input(s): CHOL, HDL, LDLCALC, TRIG, CHOLHDL, LDLDIRECT in the last 72 hours. Thyroid Function Tests: No results for input(s): TSH, T4TOTAL, FREET4, T3FREE, THYROIDAB in the last 72 hours. Anemia Panel: No results for input(s): VITAMINB12, FOLATE, FERRITIN, TIBC, IRON, RETICCTPCT in the last 72 hours. Sepsis Labs: No results for input(s): PROCALCITON, LATICACIDVEN in the last 168 hours.  Recent Results (from the past 240 hour(s))  Culture, blood (routine x 2)     Status: None (Preliminary result)   Collection Time: 01/17/17  9:45 PM  Result Value Ref Range Status   Specimen Description BLOOD LEFT ANTECUBITAL  Final   Special Requests   Final    BOTTLES DRAWN AEROBIC AND ANAEROBIC Blood Culture adequate volume   Culture   Final    NO GROWTH < 12 HOURS Performed at [facility], [address], [place], [place] [address]    Report Status PENDING  Incomplete         Radiology Studies: No results found.      Scheduled Meds: . feeding supplement (ENSURE ENLIVE)  237 mL Oral TID BM  . insulin aspart  0-9 Units Subcutaneous TID WC   Continuous  Infusions: . 0.9 % sodium chloride with kcl 100 mL/hr at 01/18/17 2135  . vancomycin Stopped (01/18/17 2246)     LOS: 2 days        Aline August, MD [facility] Pager [phone]  If 7PM-7AM, please contact night-coverage [email or web address] Password [email or web address] 01/19/2017, 12:08 PM

## 2017-01-19 NOTE — Progress Notes (Signed)
PA to bedside for gastrostomy tube removal.   Significant amount of drain around tube with considerable skin breakdown.   GJ tube removed in it's entirety without issue.  Large abdominal dressing placed.   Recommend patient stay NPO for 2 hrs to minimize leakage.  Clear liquids overnight tonight.   If leakage controlled, may resume full/solid foods tomorrow.   Brynda Greathouse, MS RD PA-C 3:30 PM

## 2017-01-20 ENCOUNTER — Encounter (HOSPITAL_COMMUNITY): Payer: Self-pay | Admitting: Student

## 2017-01-20 ENCOUNTER — Inpatient Hospital Stay (HOSPITAL_COMMUNITY): Payer: PPO

## 2017-01-20 LAB — BASIC METABOLIC PANEL
ANION GAP: 5 (ref 5–15)
BUN: 11 mg/dL (ref 6–20)
CHLORIDE: 105 mmol/L (ref 101–111)
CO2: 25 mmol/L (ref 22–32)
Calcium: 8.5 mg/dL — ABNORMAL LOW (ref 8.9–10.3)
Creatinine, Ser: 0.61 mg/dL (ref 0.61–1.24)
GFR calc non Af Amer: >60 mL/min (ref >60)
Glucose, Bld: 119 mg/dL — ABNORMAL HIGH (ref 65–99)
POTASSIUM: 3.9 mmol/L (ref 3.5–5.1)
Sodium: 135 mmol/L (ref 135–145)

## 2017-01-20 LAB — CBC WITH DIFFERENTIAL/PLATELET
BASOS ABS: 0 10*3/uL (ref 0.0–0.1)
BASOS PCT: 0 %
Eosinophils Absolute: 0 10*3/uL (ref 0.0–0.7)
Eosinophils Relative: 0 %
HEMATOCRIT: 25.8 % — AB (ref 39.0–52.0)
HEMOGLOBIN: 8.5 g/dL — AB (ref 13.0–17.0)
Lymphocytes Relative: 19 %
Lymphs Abs: 0.5 10*3/uL — ABNORMAL LOW (ref 0.7–4.0)
MCH: 31.7 pg (ref 26.0–34.0)
MCHC: 32.9 g/dL (ref 30.0–36.0)
MCV: 96.3 fL (ref 78.0–100.0)
MONOS PCT: 19 %
Monocytes Absolute: 0.5 10*3/uL (ref 0.1–1.0)
NEUTROS ABS: 1.6 10*3/uL — AB (ref 1.7–7.7)
NEUTROS PCT: 62 %
Platelets: 141 10*3/uL — ABNORMAL LOW (ref 150–400)
RBC: 2.68 MIL/uL — AB (ref 4.22–5.81)
RDW: 16.1 % — ABNORMAL HIGH (ref 11.5–15.5)
WBC: 2.6 10*3/uL — AB (ref 4.0–10.5)

## 2017-01-20 LAB — GLUCOSE, CAPILLARY
GLUCOSE-CAPILLARY: 122 mg/dL — AB (ref 65–99)
GLUCOSE-CAPILLARY: 109 mg/dL — AB (ref 65–99)
GLUCOSE-CAPILLARY: 108 mg/dL — AB (ref 65–99)
GLUCOSE-CAPILLARY: 115 mg/dL — AB (ref 65–99)

## 2017-01-20 LAB — MAGNESIUM: MAGNESIUM: 1.7 mg/dL (ref 1.7–2.4)

## 2017-01-20 MED ORDER — CEFTRIAXONE SODIUM 1 G IJ SOLR
1.0000 g | INTRAMUSCULAR | Status: DC
  Administered 2017-01-20 – 2017-01-25 (×6): 1 g via INTRAVENOUS
  Filled 2017-01-20 (×6): qty 10

## 2017-01-20 NOTE — Progress Notes (Signed)
Pharmacy Antibiotic Note  STELLAN VICK is a 77 y.o. male admitted on 01/17/2017 with abdominal wall cellulitis around leaking Gtube.  Pharmacy has been consulted for Vancomycin dosing.  Today, 01/20/2017: Day #3 vancomcyin Afebrile WBC 2.6 SCr 0.6, CrCl ~ 73 ml/min  Plan:  Continue Vancomycin 1g IV q24h.  Measure Vanc trough, peak at steady state if continued  Follow up renal fxn, culture results, and clinical course.   Height: 5\' 8"  (172.7 cm) Weight: 146 lb 13.2 oz (66.6 kg) IBW/kg (Calculated) : 68.4  Temp (24hrs), Avg:98.7 F (37.1 C), Min:98.1 F (36.7 C), Max:99.6 F (37.6 C)  Recent Labs  Lab 01/17/17 1331 01/17/17 2145 01/18/17 0525 01/19/17 0700 01/20/17 0440  WBC 1.5* 2.2*  --  2.3* 2.6*  CREATININE  --  0.73 0.62 0.58* 0.61    Estimated Creatinine Clearance: 72.8 mL/min (by C-G formula based on SCr of 0.61 mg/dL).    No Known Allergies  Antimicrobials this admission: 1/17 Vanc >>  Dose adjustments this admission:  Microbiology results: 1/17 blood: ngtd  Thank you for allowing pharmacy to be a part of this patient's care.  Gretta Arab PharmD, BCPS Pager [phone] 01/20/2017 12:36 PM

## 2017-01-20 NOTE — Progress Notes (Signed)
Patient ID: Andres Smith, male   DOB: March 06, 1940, 77 y.o.   MRN: [id number]  PROGRESS NOTE    Andres Smith  [id number]:[id number] DOB: [birth year] DOA: 01/17/2017 PCP: Janith Lima, MD   Brief Narrative: 77 year old male with history of buccal mucosa squamous cell carcinoma, dysphagia with G-tube in place, atrial fibrillation, CAD status post CABG, recent CVA was admitted to the hospital for evaluation of malfunction of his G-tube possible abdominal cellulitis. IR and oncology were consulted.  Assessment & Plan:   Active Problems:   PEG tube malfunction (HCC)   Protein-calorie malnutrition, severe     G-tube malfunction with abdominal wall cellulitis -G-tube was removed yesterday by IR -Continue vancomycin.  Cultures negative so far  Moderate to severe protein calorie malnutrition Moderate dehydration Failure to thrive -Clear liquid diet if tolerated; advance to solid diet if leak is controlled -Continue IV fluids -Patient might need TPN, will wait for further recommendations from Dr. 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 Alvy Bimler  History of recent CVA - Only on Plavix due to previous history of GI bleeding.  Can continue statin and Plavix once okay with oral diet  Coronary artery disease status post CABG -Stable.  Essential hypertension -Blood pressure stable.  treat with IV medications as needed.  Crohn's disease versus ulcerative colitis - No clear documentation of which one he has.  Continue supportive care.  Hypokalemia -Resolved  Hypomagnesemia -Improved  Thrombocytopenia -Monitor.  No signs of bleeding.  Questionable cause/can be from cancer.    DVT prophylaxis: SCDs Code Status: Partial code.  No intubation, CPR okay Family Communication: None at bedside Disposition Plan: Home once clinically improves  Consultants: IR and oncology  Procedures: None  Antimicrobials: Vancomycin from 01/17/2017  onwards   Subjective: Patient seen and examined at bedside.  No overnight fever, nausea or vomiting.  Feels weak.  He tried clear liquid diet last night after the G-tube was pulled, had some leaking. Objective: Vitals:   01/19/17 0434 01/19/17 1320 01/19/17 2059 01/20/17 0428  BP: 123/69 130/71 122/71 134/67  Pulse: (!) 104 92 86 (!) 49  Resp: 14 16 14 15   Temp: 98.8 F (37.1 C) 98.4 F (36.9 C) 98.1 F (36.7 C) 99.6 F (37.6 C)  TempSrc: Oral Oral Oral Oral  SpO2: 96% 95% 96% 97%  Weight:    66.6 kg (146 lb 13.2 oz)  Height:        Intake/Output Summary (Last 24 hours) at 01/20/2017 0954 Last data filed at 01/20/2017 0622 Gross per 24 hour  Intake 2909.5 ml  Output 500 ml  Net 2409.5 ml   Filed Weights   01/17/17 2326 01/20/17 0428  Weight: 66.4 kg (146 lb 6.2 oz) 66.6 kg (146 lb 13.2 oz)    Examination:  General exam: Appears calm and comfortable.  Respiratory system: Bilateral decreased breath sound at bases Cardiovascular system: S1 & S2 heard, rate controlled Gastrointestinal system: Abdomen is nondistended, soft and nontender. Normal bowel sounds heard.  Dressing present Extremities: No cyanosis, clubbing, edema    Data Reviewed: I have personally reviewed following labs and imaging studies  CBC: Recent Labs  Lab 01/17/17 1331 01/17/17 2145 01/19/17 0700 01/20/17 0440  WBC 1.5* 2.2* 2.3* 2.6*  NEUTROABS 1.1* 1.2* 1.4* 1.6*  HGB  --  9.3* 8.2* 8.5*  HCT 29.1* 27.9* 24.6* 25.8*  MCV 96.2 96.2 96.9 96.3  PLT 183 162 135* 353*   Basic Metabolic Panel: Recent Labs  Lab 01/17/17  1331 01/17/17 2145 01/18/17 0525 01/19/17 0700 01/20/17 0440  NA 140 140 136 136 135  K 3.5 3.4* 3.0* 4.0 3.9  CL 96* 100* 99* 105 105  CO2 33* 32 31 27 25   GLUCOSE 343* 193* 222* 141* 119*  BUN 33* 26* 20 13 11   CREATININE  --  0.73 0.62 0.58* 0.61  CALCIUM 9.4 9.1 8.2* 8.3* 8.5*  MG  --  1.9  --  1.5* 1.7   GFR: Estimated Creatinine Clearance: 72.8 mL/min (by C-G  formula based on SCr of 0.61 mg/dL). Liver Function Tests: Recent Labs  Lab 01/17/17 1331 01/17/17 2145 01/18/17 0525 01/19/17 0700  AST 12 17 12* 12*  ALT 8 10* 9* 8*  ALKPHOS 72 62 52 52  BILITOT 1.1 1.2 0.6 0.5  PROT 6.6 6.5 5.3* 5.3*  ALBUMIN 3.3* 3.2* 2.7* 2.5*   No results for input(s): LIPASE, AMYLASE in the last 168 hours. No results for input(s): AMMONIA in the last 168 hours. Coagulation Profile: No results for input(s): INR, PROTIME in the last 168 hours. Cardiac Enzymes: No results for input(s): CKTOTAL, CKMB, CKMBINDEX, TROPONINI in the last 168 hours. BNP (last 3 results) No results for input(s): PROBNP in the last 8760 hours. HbA1C: No results for input(s): HGBA1C in the last 72 hours. CBG: Recent Labs  Lab 01/19/17 0724 01/19/17 1132 01/19/17 1651 01/19/17 2101 01/20/17 0742  GLUCAP 146* 169* 143* 143* 122*   Lipid Profile: No results for input(s): CHOL, HDL, LDLCALC, TRIG, CHOLHDL, LDLDIRECT in the last 72 hours. Thyroid Function Tests: No results for input(s): TSH, T4TOTAL, FREET4, T3FREE, THYROIDAB in the last 72 hours. Anemia Panel: No results for input(s): VITAMINB12, FOLATE, FERRITIN, TIBC, IRON, RETICCTPCT in the last 72 hours. Sepsis Labs: No results for input(s): PROCALCITON, LATICACIDVEN in the last 168 hours.  Recent Results (from the past 240 hour(s))  Culture, blood (routine x 2)     Status: None (Preliminary result)   Collection Time: 01/17/17  9:40 PM  Result Value Ref Range Status   Specimen Description BLOOD PORTA CATH RIGHT CHEST  Final   Special Requests   Final    BOTTLES DRAWN AEROBIC AND ANAEROBIC Blood Culture adequate volume   Culture   Final    NO GROWTH 1 DAY Performed at [facility], [address], [place], [place] [address]    Report Status PENDING  Incomplete  Culture, blood (routine x 2)     Status: None (Preliminary result)   Collection Time: 01/17/17  9:45 PM  Result Value Ref Range Status   Specimen  Description BLOOD LEFT ANTECUBITAL  Final   Special Requests   Final    BOTTLES DRAWN AEROBIC AND ANAEROBIC Blood Culture adequate volume   Culture   Final    NO GROWTH 2 DAYS Performed at [facility], [place] [address], [place], [place] [address]    Report Status PENDING  Incomplete         Radiology Studies: No results found.      Scheduled Meds: . feeding supplement (ENSURE ENLIVE)  237 mL Oral TID BM  . insulin aspart  0-9 Units Subcutaneous TID WC   Continuous Infusions: . sodium chloride 75 mL/hr at 01/20/17 0622  . vancomycin Stopped (01/19/17 2301)     LOS: 3 days        Aline August, MD [facility] Pager [phone]  If 7PM-7AM, please contact night-coverage [email or web address] Password [facility] 01/20/2017, 9:54 AM

## 2017-01-21 ENCOUNTER — Encounter (HOSPITAL_COMMUNITY): Payer: Self-pay | Admitting: *Deleted

## 2017-01-21 DIAGNOSIS — L03311 Cellulitis of abdominal wall: Secondary | ICD-10-CM

## 2017-01-21 DIAGNOSIS — D61818 Other pancytopenia: Secondary | ICD-10-CM

## 2017-01-21 LAB — CBC WITH DIFFERENTIAL/PLATELET
Basophils Absolute: 0 10*3/uL (ref 0.0–0.1)
Basophils Relative: 0 %
EOS ABS: 0 10*3/uL (ref 0.0–0.7)
EOS PCT: 1 %
HCT: 25.3 % — ABNORMAL LOW (ref 39.0–52.0)
HEMOGLOBIN: 8.6 g/dL — AB (ref 13.0–17.0)
LYMPHS ABS: 0.5 10*3/uL — AB (ref 0.7–4.0)
LYMPHS PCT: 16 %
MCH: 32.3 pg (ref 26.0–34.0)
MCHC: 34 g/dL (ref 30.0–36.0)
MCV: 95.1 fL (ref 78.0–100.0)
Monocytes Absolute: 0.7 10*3/uL (ref 0.1–1.0)
Monocytes Relative: 21 %
Neutro Abs: 2.1 10*3/uL (ref 1.7–7.7)
Neutrophils Relative %: 62 %
Platelets: 137 10*3/uL — ABNORMAL LOW (ref 150–400)
RBC: 2.66 MIL/uL — ABNORMAL LOW (ref 4.22–5.81)
RDW: 16.1 % — ABNORMAL HIGH (ref 11.5–15.5)
WBC: 3.3 10*3/uL — ABNORMAL LOW (ref 4.0–10.5)

## 2017-01-21 LAB — BASIC METABOLIC PANEL
Anion gap: 8 (ref 5–15)
BUN: 12 mg/dL (ref 6–20)
CHLORIDE: 102 mmol/L (ref 101–111)
CO2: 23 mmol/L (ref 22–32)
CREATININE: 0.65 mg/dL (ref 0.61–1.24)
Calcium: 8.3 mg/dL — ABNORMAL LOW (ref 8.9–10.3)
GFR calc Af Amer: >60 mL/min (ref >60)
GFR calc non Af Amer: >60 mL/min (ref >60)
GLUCOSE: 142 mg/dL — AB (ref 65–99)
Potassium: 3.8 mmol/L (ref 3.5–5.1)
SODIUM: 133 mmol/L — AB (ref 135–145)

## 2017-01-21 LAB — GLUCOSE, CAPILLARY
GLUCOSE-CAPILLARY: 147 mg/dL — AB (ref 65–99)
GLUCOSE-CAPILLARY: 142 mg/dL — AB (ref 65–99)
Glucose-Capillary: 128 mg/dL — ABNORMAL HIGH (ref 65–99)

## 2017-01-21 LAB — MAGNESIUM: MAGNESIUM: 1.4 mg/dL — AB (ref 1.7–2.4)

## 2017-01-21 MED ORDER — PREDNISONE 5 MG PO TABS
10.0000 mg | ORAL_TABLET | Freq: Every day | ORAL | Status: DC
  Administered 2017-01-22: 10 mg via ORAL
  Filled 2017-01-21: qty 2

## 2017-01-21 MED ORDER — CLOPIDOGREL BISULFATE 75 MG PO TABS
75.0000 mg | ORAL_TABLET | Freq: Every day | ORAL | Status: DC
  Administered 2017-01-21 – 2017-02-13 (×23): 75 mg via ORAL
  Filled 2017-01-21 (×26): qty 1

## 2017-01-21 MED ORDER — TRAMADOL HCL 50 MG PO TABS
50.0000 mg | ORAL_TABLET | Freq: Four times a day (QID) | ORAL | Status: DC | PRN
  Administered 2017-01-21: 50 mg via ORAL
  Filled 2017-01-21: qty 1

## 2017-01-21 MED ORDER — MORPHINE SULFATE (PF) 2 MG/ML IV SOLN
1.0000 mg | INTRAVENOUS | Status: DC | PRN
  Administered 2017-01-21 – 2017-02-11 (×29): 1 mg via INTRAVENOUS
  Filled 2017-01-21 (×33): qty 1

## 2017-01-21 MED ORDER — ATORVASTATIN CALCIUM 40 MG PO TABS
80.0000 mg | ORAL_TABLET | Freq: Every day | ORAL | Status: DC
  Filled 2017-01-21: qty 2

## 2017-01-21 MED ORDER — ZINC OXIDE 12.8 % EX OINT
TOPICAL_OINTMENT | CUTANEOUS | Status: DC
  Administered 2017-01-21 – 2017-01-24 (×24): via TOPICAL
  Filled 2017-01-21 (×4): qty 56.7

## 2017-01-21 MED ORDER — MORPHINE SULFATE (PF) 2 MG/ML IV SOLN
1.0000 mg | INTRAVENOUS | Status: DC | PRN
Start: 2017-01-21 — End: 2017-01-21
  Administered 2017-01-21: 1 mg via INTRAVENOUS

## 2017-01-21 MED ORDER — MAGNESIUM SULFATE 2 GM/50ML IV SOLN
2.0000 g | Freq: Once | INTRAVENOUS | Status: AC
  Administered 2017-01-21: 2 g via INTRAVENOUS
  Filled 2017-01-21: qty 50

## 2017-01-21 NOTE — Progress Notes (Signed)
Patient ID: Andres Smith, male   DOB: January 23, 1940, 77 y.o.   MRN: [id number]  PROGRESS NOTE    Andres Smith  Andres Smith:[id number] DOB: [birth year] DOA: 01/17/2017 PCP: Janith Lima, MD   Brief Narrative: 77 year old male with history of buccal mucosa squamous cell carcinoma, dysphagia with G-tube in place, atrial fibrillation, CAD status post CABG, recent CVA was admitted to the hospital for evaluation of malfunction of his G-tube possible abdominal cellulitis. IR and oncology were consulted.  Assessment & Plan:   Active Problems:   PEG tube malfunction (HCC)   Protein-calorie malnutrition, severe     G-tube malfunction with abdominal wall cellulitis -G-tube has been removed by IR -Vancomycin was switched to Rocephin on 01/20/2017.  Cultures negative so far - Consult wound care  Moderate to severe protein calorie malnutrition Moderate dehydration Failure to thrive -Currently on clear liquid diet. Advance to soft diet if tolerated -Continue IV fluids -Patient might need TPN, will avoid for now as per recommendations from Dr. 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 Alvy Bimler  History of recent CVA - Only on Plavix due to previous history of GI bleeding. Will restart Plavix and statin   Coronary artery disease status post CABG -Stable.  Essential hypertension -Blood pressure stable. Monitor  Crohn's disease versus ulcerative colitis - No clear documentation of which one he has.  Continue supportive care.  Hypokalemia -Resolved  Hypomagnesemia -Replace. Repeat a.m. labs  Pancytopenia -Monitor.  No signs of bleeding.  Probably from cancer. Hematology following    DVT prophylaxis: SCDs Code Status: Partial code.  No intubation, CPR okay Family Communication: Spoke to wife at bedside Disposition Plan: Home once clinically improves  Consultants: IR and oncology  Procedures: None  Antimicrobials: Vancomycin   01/17/2017-01/20/2017 Rocephin from 01/20/2017 onwards   Subjective: Patient seen and examined at bedside.  No overnight fever, nausea or vomiting.  Feels weak. Wife at bedside thinks he is a little groggy this morning.   Objective: Vitals:   01/20/17 0428 01/20/17 1329 01/20/17 2110 01/21/17 0610  BP: 134/67 130/81 134/85 109/64  Pulse: (!) 49 95 85 (!) 101  Resp: 15 16 18 18   Temp: 99.6 F (37.6 C) 99.1 F (37.3 C) 98.5 F (36.9 C) 98.7 F (37.1 C)  TempSrc: Oral Oral Oral Oral  SpO2: 97% 98% 98% 96%  Weight: 66.6 kg (146 lb 13.2 oz)   67.9 kg (149 lb 11.2 oz)  Height:        Intake/Output Summary (Last 24 hours) at 01/21/2017 1019 Last data filed at 01/21/2017 0600 Gross per 24 hour  Intake 1822.5 ml  Output 900 ml  Net 922.5 ml   Filed Weights   01/17/17 2326 01/20/17 0428 01/21/17 0610  Weight: 66.4 kg (146 lb 6.2 oz) 66.6 kg (146 lb 13.2 oz) 67.9 kg (149 lb 11.2 oz)    Examination:  General exam: Appears calm and comfortable. No distress Respiratory system: Bilateral decreased breath sound at bases Cardiovascular system: S1 & S2 heard, rate controlled Gastrointestinal system: Abdomen is nondistended, soft and nontender. Normal bowel sounds heard.  Dressing present Extremities: No cyanosis, clubbing, edema    Data Reviewed: I have personally reviewed following labs and imaging studies  CBC: Recent Labs  Lab 01/17/17 1331 01/17/17 2145 01/19/17 0700 01/20/17 0440 01/21/17 0615  WBC 1.5* 2.2* 2.3* 2.6* 3.3*  NEUTROABS 1.1* 1.2* 1.4* 1.6* 2.1  HGB  --  9.3* 8.2* 8.5* 8.6*  HCT 29.1* 27.9* 24.6* 25.8*  25.3*  MCV 96.2 96.2 96.9 96.3 95.1  PLT 183 162 135* 141* 782*   Basic Metabolic Panel: Recent Labs  Lab 01/17/17 2145 01/18/17 0525 01/19/17 0700 01/20/17 0440 01/21/17 0615  NA 140 136 136 135 133*  K 3.4* 3.0* 4.0 3.9 3.8  CL 100* 99* 105 105 102  CO2 32 31 27 25 23   GLUCOSE 193* 222* 141* 119* 142*  BUN 26* 20 13 11 12   CREATININE 0.73 0.62  0.58* 0.61 0.65  CALCIUM 9.1 8.2* 8.3* 8.5* 8.3*  MG 1.9  --  1.5* 1.7 1.4*   GFR: Estimated Creatinine Clearance: 74.3 mL/min (by C-G formula based on SCr of 0.65 mg/dL). Liver Function Tests: Recent Labs  Lab 01/17/17 1331 01/17/17 2145 01/18/17 0525 01/19/17 0700  AST 12 17 12* 12*  ALT 8 10* 9* 8*  ALKPHOS 72 62 52 52  BILITOT 1.1 1.2 0.6 0.5  PROT 6.6 6.5 5.3* 5.3*  ALBUMIN 3.3* 3.2* 2.7* 2.5*   No results for input(s): LIPASE, AMYLASE in the last 168 hours. No results for input(s): AMMONIA in the last 168 hours. Coagulation Profile: No results for input(s): INR, PROTIME in the last 168 hours. Cardiac Enzymes: No results for input(s): CKTOTAL, CKMB, CKMBINDEX, TROPONINI in the last 168 hours. BNP (last 3 results) No results for input(s): PROBNP in the last 8760 hours. HbA1C: No results for input(s): HGBA1C in the last 72 hours. CBG: Recent Labs  Lab 01/20/17 0742 01/20/17 1211 01/20/17 1640 01/20/17 2109 01/21/17 0900  GLUCAP 122* 109* 108* 115* 128*   Lipid Profile: No results for input(s): CHOL, HDL, LDLCALC, TRIG, CHOLHDL, LDLDIRECT in the last 72 hours. Thyroid Function Tests: No results for input(s): TSH, T4TOTAL, FREET4, T3FREE, THYROIDAB in the last 72 hours. Anemia Panel: No results for input(s): VITAMINB12, FOLATE, FERRITIN, TIBC, IRON, RETICCTPCT in the last 72 hours. Sepsis Labs: No results for input(s): PROCALCITON, LATICACIDVEN in the last 168 hours.  Recent Results (from the past 240 hour(s))  Culture, blood (routine x 2)     Status: None (Preliminary result)   Collection Time: 01/17/17  9:40 PM  Result Value Ref Range Status   Specimen Description BLOOD PORTA CATH RIGHT CHEST  Final   Special Requests   Final    BOTTLES DRAWN AEROBIC AND ANAEROBIC Blood Culture adequate volume   Culture   Final    NO GROWTH 2 DAYS Performed at [facility], [address] [place], [place] [address]    Report Status PENDING  Incomplete  Culture,  blood (routine x 2)     Status: None (Preliminary result)   Collection Time: 01/17/17  9:45 PM  Result Value Ref Range Status   Specimen Description BLOOD LEFT ANTECUBITAL  Final   Special Requests   Final    BOTTLES DRAWN AEROBIC AND ANAEROBIC Blood Culture adequate volume   Culture   Final    NO GROWTH 3 DAYS Performed at [facility], [place] [address], [place], [place] [address]    Report Status PENDING  Incomplete         Radiology Studies: Ir Gastrostomy Tube Removal  Result Date: 01/20/2017 INDICATION: Patient with history of poor oral intake s/p gastrostomy placement in May 2018 has undergone several exchanges and upsizing of tube in an effort to control leakage without success. Most recent exchanged was on 01/15/17 at which time a 35 [place] GJ tube was placed. Request now made for tube removal. EXAM: BEDSIDE REMOVAL OF GASTROSTOMY TUBE COMPARISON:  None. MEDICATIONS: NoNonene. CONTRAST:  None FLUOROSCOPY TIME:  None COMPLICATIONS: None immediate. PROCEDURE: A time-out was performed prior to the initiation of the procedure. Retention balloon was deflated. 10 mL of saline removed from balloon. The existing gastrojejunostomy tube was removed intact. A dressing was placed. The patient tolerated the procedure well without immediate postprocedural complication. IMPRESSION: Successful bedside removal of 47 French GJ tube without complication. Read by: Brynda Greathouse PA-C Electronically Signed   By: Markus Daft M.D.   On: 01/20/2017 09:39        Scheduled Meds: . feeding supplement (ENSURE ENLIVE)  237 mL Oral TID BM  . insulin aspart  0-9 Units Subcutaneous TID WC   Continuous Infusions: . sodium chloride 75 mL/hr at 01/20/17 0622  . cefTRIAXone (ROCEPHIN)  IV Stopped (01/20/17 1854)     LOS: 4 days        Aline August, MD [facility] Pager [phone]  If 7PM-7AM, please contact night-coverage [email or web address] Password [facility] 01/21/2017, 10:19 AM

## 2017-01-21 NOTE — Consult Note (Signed)
[facility] Nurse wound consult note Reason for Consult:SIte of previous gastrostomy tube Wound type:nonhealing wound Pressure Injury POA: NA Measurement:2.5cm round with undetermined depth as opening reaches stomach Wound bed:red, moist Drainage (amount, consistency, odor) food material Periwound:erythematous, denuded, macerated Dressing procedure/placement/frequency: Skin is not intact and pouching will not adhere.  We will return to the use of triple paste to the peritube site skin, cover tube site with silver hydrofiber and change every 2 hours. Note:  Previous Nursing strategy employed this weekend has been to place several layers of dressings on top of this wound and seal it in: this will not work and will be deleterious to the peritube tissue. If we can heal the peritube tissue, I may be successful placing a pouch.  Will see again tomorrow and reevaluate for progress.  [facility] nursing team will follow, and will remain available to this patient, the nursing and medical teams.   Thanks, Maudie Flakes, MSN, RN, [place], Arther Abbott  Pager# [phone]

## 2017-01-21 NOTE — Progress Notes (Signed)
Andres Smith   DOB:22-Jun-1940   EU#:[id number]    Subjective: The patient looks frail.  He is somewhat sleepy when I check on him this morning.  Apparently, feeding tube was removed over the weekend and he attempted to eat liquid diet.  Each time he eats, he would notice leakage at the site of the feeding tube removal  Objective:  Vitals:   01/20/17 2110 01/21/17 0610  BP: 134/85 109/64  Pulse: 85 (!) 101  Resp: 18 18  Temp: 98.5 F (36.9 C) 98.7 F (37.1 C)  SpO2: 98% 96%     Intake/Output Summary (Last 24 hours) at 01/21/2017 0921 Last data filed at 01/21/2017 0600 Gross per 24 hour  Intake 1822.5 ml  Output 900 ml  Net 922.5 ml    GENERAL:alert, no distress and comfortable.  He looks thin and frail  sKIN: The skin near the prior feeding tube site are bandaged up.  I attempted to take a look at it and it looked crusty with smell consistent with his nutritional supplement EYES: normal, Conjunctiva are pink and non-injected, sclera clear Musculoskeletal:no cyanosis of digits and no clubbing  NEURO: alert & oriented x 3 with fluent speech, no focal motor/sensory deficits   Labs:  Lab Results  Component Value Date   WBC 3.3 (L) 01/21/2017   HGB 8.6 (L) 01/21/2017   HCT 25.3 (L) 01/21/2017   MCV 95.1 01/21/2017   PLT 137 (L) 01/21/2017   NEUTROABS 2.1 01/21/2017    Lab Results  Component Value Date   NA 133 (L) 01/21/2017   K 3.8 01/21/2017   CL 102 01/21/2017   CO2 23 01/21/2017    Studies:  Ir Gastrostomy Tube Removal  Result Date: 01/20/2017 INDICATION: Patient with history of poor oral intake s/p gastrostomy placement in May 2018 has undergone several exchanges and upsizing of tube in an effort to control leakage without success. Most recent exchanged was on 01/15/17 at which time a 43 [place] GJ tube was placed. Request now made for tube removal. EXAM: BEDSIDE REMOVAL OF GASTROSTOMY TUBE COMPARISON:  None. MEDICATIONS: NoNonene. CONTRAST:  None FLUOROSCOPY TIME:  None  COMPLICATIONS: None immediate. PROCEDURE: A time-out was performed prior to the initiation of the procedure. Retention balloon was deflated. 10 mL of saline removed from balloon. The existing gastrojejunostomy tube was removed intact. A dressing was placed. The patient tolerated the procedure well without immediate postprocedural complication. IMPRESSION: Successful bedside removal of 27 [place] GJ tube without complication. Read by: Brynda Greathouse PA-C Electronically Signed   By: Markus Daft M.D.   On: 01/20/2017 09:39    Assessment & Plan:   Buccal mucosa squamous cell carcinoma (HCC) He tolerated chemotherapy wellexcept for progressive weight loss and lack of appetite  Due to decline of performance status, he is not a candidate for further chemotherapy right now.  We will continue supportive care  Acquired pancytopenia [facility]) He has acquired pancytopenia due to chemotherapy He is not symptomatic Observe only.  He does not need G-CSF support or blood transfusion  Cancer associated pain The patient has intermittent rib pain Low-dose tramadol appears to control his pain well For now, we will continue tramadol as needed for pain  Dysphagia, leaking around feeding tube Dysphagia is stable He is able to swallow food but not motivated eat enough and maintain his caloric intake. Based on our prior discussion, the patient is motivated to eat.  However, whenever he tries to eat, there is leakage of nutritional supplement near the  feeding tube site which is not unusual.  I recommend calorie-dense food of soft diet consistency rather than liquid nutritional supplement. I would like to try to avoid TPN if possible but if his wound does not heal, we might have to resort to temporary TPN in the short-term   [name], MD 01/21/2017  9:21 AM

## 2017-01-22 LAB — CBC WITH DIFFERENTIAL/PLATELET
Basophils Absolute: 0 10*3/uL (ref 0.0–0.1)
Basophils Relative: 0 %
EOS ABS: 0 10*3/uL (ref 0.0–0.7)
Eosinophils Relative: 1 %
HCT: 24.4 % — ABNORMAL LOW (ref 39.0–52.0)
HEMOGLOBIN: 8.1 g/dL — AB (ref 13.0–17.0)
Lymphocytes Relative: 15 %
Lymphs Abs: 0.5 10*3/uL — ABNORMAL LOW (ref 0.7–4.0)
MCH: 31.6 pg (ref 26.0–34.0)
MCHC: 33.2 g/dL (ref 30.0–36.0)
MCV: 95.3 fL (ref 78.0–100.0)
MONO ABS: 0.8 10*3/uL (ref 0.1–1.0)
MONOS PCT: 26 %
NEUTROS PCT: 58 %
Neutro Abs: 1.9 10*3/uL (ref 1.7–7.7)
Platelets: 132 10*3/uL — ABNORMAL LOW (ref 150–400)
RBC: 2.56 MIL/uL — ABNORMAL LOW (ref 4.22–5.81)
RDW: 16.2 % — ABNORMAL HIGH (ref 11.5–15.5)
WBC: 3.2 10*3/uL — ABNORMAL LOW (ref 4.0–10.5)

## 2017-01-22 LAB — BASIC METABOLIC PANEL
Anion gap: 7 (ref 5–15)
BUN: 12 mg/dL (ref 6–20)
CHLORIDE: 105 mmol/L (ref 101–111)
CO2: 24 mmol/L (ref 22–32)
CREATININE: 0.6 mg/dL — AB (ref 0.61–1.24)
Calcium: 8.4 mg/dL — ABNORMAL LOW (ref 8.9–10.3)
GFR calc Af Amer: >60 mL/min (ref >60)
GFR calc non Af Amer: >60 mL/min (ref >60)
Glucose, Bld: 116 mg/dL — ABNORMAL HIGH (ref 65–99)
Potassium: 3.5 mmol/L (ref 3.5–5.1)
SODIUM: 136 mmol/L (ref 135–145)

## 2017-01-22 LAB — CULTURE, BLOOD (ROUTINE X 2)
CULTURE: NO GROWTH
Special Requests: ADEQUATE

## 2017-01-22 LAB — PHOSPHORUS: Phosphorus: 2.3 mg/dL — ABNORMAL LOW (ref 2.5–4.6)

## 2017-01-22 LAB — GLUCOSE, CAPILLARY: GLUCOSE-CAPILLARY: 109 mg/dL — AB (ref 65–99)

## 2017-01-22 LAB — PREALBUMIN: Prealbumin: <5 mg/dL — ABNORMAL LOW (ref 18–38)

## 2017-01-22 LAB — MAGNESIUM: MAGNESIUM: 1.6 mg/dL — AB (ref 1.7–2.4)

## 2017-01-22 MED ORDER — INSULIN ASPART 100 UNIT/ML ~~LOC~~ SOLN
0.0000 [IU] | Freq: Four times a day (QID) | SUBCUTANEOUS | Status: DC
  Administered 2017-01-23: 5 [IU] via SUBCUTANEOUS
  Administered 2017-01-23: 3 [IU] via SUBCUTANEOUS
  Administered 2017-01-23: 2 [IU] via SUBCUTANEOUS
  Administered 2017-01-23: 3 [IU] via SUBCUTANEOUS
  Administered 2017-01-24: 5 [IU] via SUBCUTANEOUS
  Administered 2017-01-24: 7 [IU] via SUBCUTANEOUS

## 2017-01-22 MED ORDER — POTASSIUM PHOSPHATES 15 MMOLE/5ML IV SOLN
20.0000 mmol | Freq: Once | INTRAVENOUS | Status: AC
  Administered 2017-01-22: 20 mmol via INTRAVENOUS
  Filled 2017-01-22: qty 6.67

## 2017-01-22 MED ORDER — MAGNESIUM SULFATE 2 GM/50ML IV SOLN
2.0000 g | Freq: Once | INTRAVENOUS | Status: AC
  Administered 2017-01-22: 2 g via INTRAVENOUS
  Filled 2017-01-22: qty 50

## 2017-01-22 MED ORDER — TRACE MINERALS CR-CU-MN-SE-ZN 10-1000-500-60 MCG/ML IV SOLN
INTRAVENOUS | Status: AC
  Administered 2017-01-22: 18:00:00 via INTRAVENOUS
  Filled 2017-01-22: qty 960

## 2017-01-22 MED ORDER — SODIUM CHLORIDE 0.9 % IV SOLN
INTRAVENOUS | Status: AC
  Administered 2017-01-22 – 2017-01-23 (×2): via INTRAVENOUS

## 2017-01-22 MED ORDER — FAT EMULSION 20 % IV EMUL
240.0000 mL | INTRAVENOUS | Status: AC
  Administered 2017-01-22: 240 mL via INTRAVENOUS
  Filled 2017-01-22: qty 250

## 2017-01-22 NOTE — Progress Notes (Signed)
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

## 2017-01-22 NOTE — Consult Note (Signed)
[place] Nurse wound follow up Wound type:site of previous gastrostomy Measurement:2.5cm round Wound bed:red Drainage (amount, consistency, odor) decreasing effluent, however patient has refused to eat or drink today due to leakage.  TPN started. Periwound:treatment POC started yesterday with Aquacel Ag+ and triple paste to the periwound site is successful and denuded area is improved.  Will continue. Dressing procedure/placement/frequency: Continue dressing as ordered yesterday. [place] nursing team will follow along with you, and will remain available to this patient, the nursing and medical teams.   Thanks, Maudie Flakes, MSN, RN, [place], Arther Abbott  Pager# [phone]

## 2017-01-22 NOTE — Progress Notes (Signed)
Andres Smith   DOB:[birth year]   GY#:[id number]    Subjective: I have reviewed his chart.  The patient is afraid to eat because every attempt to eat will cause leakage around prior feeding tube site.  His wound is being assessed by wound care nurse.  He is receiving intravenous antibiotics.  He has negligible oral intake yesterday.  Objective:  Vitals:   01/21/17 2040 01/22/17 0517  BP: 123/70 120/62  Pulse: (!) 104 98  Resp: 20 20  Temp: 98.5 F (36.9 C) 99.2 F (37.3 C)  SpO2: 96% 96%     Intake/Output Summary (Last 24 hours) at 01/22/2017 0750 Last data filed at 01/22/2017 0600 Gross per 24 hour  Intake 1910 ml  Output -  Net 1910 ml    GENERAL:alert, no distress and comfortable.  He looks weak and frail SKIN: Prior feeding tube site is bandaged.  I did not remove it to examine his wound.   EYES: normal, Conjunctiva are pink and non-injected, sclera clear NEURO: alert & oriented x 3 with fluent speech   Labs:  Lab Results  Component Value Date   WBC 3.2 (L) 01/22/2017   HGB 8.1 (L) 01/22/2017   HCT 24.4 (L) 01/22/2017   MCV 95.3 01/22/2017   PLT 132 (L) 01/22/2017   NEUTROABS 1.9 01/22/2017    Lab Results  Component Value Date   NA 136 01/22/2017   K 3.5 01/22/2017   CL 105 01/22/2017   CO2 24 01/22/2017   Assessment & Plan:   Buccal mucosa squamous cell carcinoma (HCC) He tolerated chemotherapy wellexcept for progressive weight loss and lack of appetite Due to decline of performance status, he is not a candidate for further chemotherapy right now. We will continue supportive care  Acquired pancytopenia [facility]) He has acquired pancytopenia due to chemotherapy He is not symptomatic Observe only. He does not need G-CSF support or blood transfusion  Cancer associated pain The patient has intermittent rib pain Low-dose tramadol appears to control his pain well For now, we will continue tramadol as needed for pain  Dysphagia, leaking around feeding  tube Dysphagia is stable He is able to swallow food but not motivated eat enoughand maintain his caloric intake. Whenever he tries to eat, there is leakage of nutritional supplement near the feeding tube site which is not unusual.    Typically, I would expect the feeding tube site to close within 2-3 days.  However, due to his severe malnutrition and chronic prednisone use, that healing is taking longer time than expected. Unfortunately, he is not making any progress with oral intake due to the leak. After extensive discussion with him and his wife, were in favor of trial of short-term TPN to provide him with nutritional needs and to avoid oral intake that is currently exacerbating the healing process around his feeding tube site. Primary service will consult pharmacist for TPN regimen. We will watch for refeeding syndrome  Slow wound healing around feeding tube site Appreciate care from wound care nurse  Goals of care I have some discussion with his wife.  She is afraid that her husband is not going to make it and he is actively dying from malnutrition. I recommend short-term trial of TPN for 7 days. If he has no meaningful improvement beyond 7 days, we will have further family discussion about stopping artificial feeding.  Discharge planning He is not ready to be discharged [name], MD 01/22/2017  7:50 AM

## 2017-01-22 NOTE — Progress Notes (Signed)
[place] NOTE   Pharmacy Consult for short-term trial of TPN for 7 days. Indication: unable to tolerate PO intake  Patient Measurements: Body mass index is 22.76 kg/m. Filed Weights   01/17/17 2326 01/20/17 0428 01/21/17 0610  Weight: 146 lb 6.2 oz (66.4 kg) 146 lb 13.2 oz (66.6 kg) 149 lb 11.2 oz (67.9 kg)  Ideal Body Weight:  70 kg  HPI:  81 yoM admitted on 1/17 with c/o G-tube leaking blood and gastric contents around exit site causing erosion of surrounding skin.  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  IR converted to larger G-J tube on 1/15, but it has continued to leak.  On 1/19 the [place] tube was permanently removed without complications.  He continues to have leakage through the non-healing tube site and is therefore unable to meet nutrition goals with oral diet. Pharmacy is consulted for trial of short-term TPN to provide nutrition and avoid oral intake that is exacerbating the healing process around his feeding tube site.  Significant events:    Insulin requirements past 24 hours: 2 units sensitive SSI (no hx DM)  Current Nutrition: Soft (NPO?) Ensure supplements ordered  IVF: NS at 75 ml/hr  Central access: Implanted port (05/21/16) TPN start date: 01/22/17  ASSESSMENT                                                                                                          Today:   Glucose:  Within goal range < 150  Electrolytes:  Na, K, CorrCa 9.6 WNL.  Mag 1.6, and Phos are low.  Renal:  SCr 0.6  LFTs:  Low/stable  TGs:  Pending (1/22)  Prealbumin:  Pending (1/22)   NUTRITIONAL GOALS                                                                                             RD recs (1/18):  110-120 Protein, 2350-2550 Kcal  Clinimix 5/20 at a goal rate of 83 ml/hr + 20% fat emulsion 240 ml/day to provide: 100 g of  protein and 2233 kCals per day meeting 91 % of protein and 95 % of kCal needs   Glucose infusion rate will be 4.07 mg/kg/min (Maximum 5 mg/kg/min)   PLAN  Now: Magnesium 2g IV x1 dose KPhos 20 mmol IV x1 dose  (also provides ~29 mEq K)  At 1800 today:  Start Clinimix E 5/20 at 40 ml/hr.  20% fat emulsion at 20 ml/hr x12 hours/day  Plan to advance as tolerated to the goal rate.  TPN to contain standard multivitamins and trace element daily.  Reduce IVF to 35 ml/hr.  Add CBGs and sensitive SSI q6h.   TPN lab panels on Mondays & Thursdays.  Monitor for re-feeding syndrome: check BMET, Mag, Phos daily x3 days    Gretta Arab PharmD, BCPS Pager [phone] 01/22/2017 8:13 AM

## 2017-01-22 NOTE — Progress Notes (Signed)
Nutrition Follow-up  DOCUMENTATION CODES:   Severe malnutrition in context of chronic illness  INTERVENTION:   Monitor magnesium, potassium, and phosphorus daily for at least 3 days, MD to replete as needed, as pt is at risk for refeeding syndrome given severe malnutrition and lack of nutrition.  TPN per Pharmacy   NUTRITION DIAGNOSIS:   Severe Malnutrition related to chronic illness, cancer and cancer related treatments as evidenced by percent weight loss, moderate fat depletion, severe fat depletion, severe muscle depletion.  Ongoing.  GOAL:   Patient will meet greater than or equal to 90% of their needs  Not meeting.  MONITOR:   Labs, Weight trends, I & O's(TPN)  REASON FOR ASSESSMENT:   Consult New TPN/TNA  ASSESSMENT:   Pt with PMH significant for buccal mucosa squamous cell carcinoma (chemo/radiation), dysphagia, s/p PEG placement May 2018, CAD, DM, HLD, and HTN. Presents this admission with PEG tube malfunction. Surgery notes pt had PEG tube upsize from 54F to 49F 1/15, but it continues to leak around the site.   Pt continued to have leaks via G-tube. G-J tube was removed 1/19. Pt c/o leaking through tube site regardless of what he eats. Per discussion with RN, even water is leaking through. Notified RN of pt's requests for mouth swabs given dry mouth. Pt refusing to eat/drink anything d/t fear of leaking. Oncology recommending short-term TPN to aid in healing. Estimated needs below.  Medications: IV Rocephin, IV Mg sulfate once,  K-Phos infusion once Labs reviewed: CBGs: 142-147 Low Phos/Mg  Diet Order:  DIET SOFT Room service appropriate? Yes; Fluid consistency: Thin TPN (CLINIMIX-E) Adult  EDUCATION NEEDS:   Education needs have been addressed  Skin:  Skin Assessment: Reviewed RN Assessment  Last BM:  PTA  Height:   Ht Readings from Last 1 Encounters:  01/17/17 5\' 8"  (1.727 m)    Weight:   Wt Readings from Last 1 Encounters:  01/21/17 149 lb  11.2 oz (67.9 kg)    Ideal Body Weight:  70 kg  BMI:  Body mass index is 22.76 kg/m.  Estimated Nutritional Needs:   Kcal:  2482-5003 kcal/day  Protein:  110-120 g/day  Fluid:  >2.3 L/day  Clayton Bibles, MS, RD, LDN [place] Dietitian Pager: [phone] After Hours Pager: [phone]

## 2017-01-22 NOTE — Progress Notes (Signed)
Patient ID: Andres Smith, male   DOB: [birth year], 77 y.o.   MRN: [id number]  PROGRESS NOTE    TAYQUAN GASSMAN  XNA:[id number] DOB: January 27, 1940 DOA: 01/17/2017 PCP: Janith Lima, MD   Brief Narrative: 77 year old male with history of buccal mucosa squamous cell carcinoma, dysphagia with G-tube in place, atrial fibrillation, CAD status post CABG, recent CVA was admitted to the hospital for evaluation of malfunction of his G-tube possible abdominal cellulitis. IR and oncology were consulted.  Assessment & Plan:   Active Problems:   PEG tube malfunction (HCC)   Protein-calorie malnutrition, severe     G-tube malfunction with abdominal wall cellulitis -G-tube has been removed by IR -Vancomycin was switched to Rocephin on 01/20/2017.  Cultures negative so far -Continue wound care  Moderate to severe protein calorie malnutrition Moderate dehydration Failure to thrive -Patient is afraid to eat because of leakage around prior feeding tube site  -Spoke to Dr. Alvy Bimler.  Will start TPN for now.  Pharmacy consulted   Buccal mucosa squamous cell carcinoma with dysphagia -Follows with Dr. Alvy Bimler  History of recent CVA - Only on Plavix due to previous history of GI bleeding. Continue Plavix and statin   Coronary artery disease status post CABG -Stable.  Essential hypertension -Blood pressure stable. Monitor  Crohn's disease versus ulcerative colitis - No clear documentation of which one he has.  Continue supportive care.  Hypokalemia -Resolved  Hypomagnesemia -Replace. Repeat a.m. labs  Pancytopenia -Monitor.  No signs of bleeding.  Probably from cancer.  Oncology following    DVT prophylaxis: SCDs Code Status: Partial code.  No intubation, CPR okay Family Communication: None at bedside Disposition Plan: Home once clinically improves  Consultants: IR and oncology  Procedures: None  Antimicrobials: Vancomycin  01/17/2017-01/20/2017 Rocephin from 01/20/2017  onwards   Subjective: Patient seen and examined at bedside.  No overnight fever, nausea or vomiting.  Feels weak.  Objective: Vitals:   01/21/17 0610 01/21/17 1331 01/21/17 2040 01/22/17 0517  BP: 109/64 117/67 123/70 120/62  Pulse: (!) 101 98 (!) 104 98  Resp: 18 16 20 20   Temp: 98.7 F (37.1 C) 98.1 F (36.7 C) 98.5 F (36.9 C) 99.2 F (37.3 C)  TempSrc: Oral Oral Oral Oral  SpO2: 96% 95% 96% 96%  Weight: 67.9 kg (149 lb 11.2 oz)     Height:        Intake/Output Summary (Last 24 hours) at 01/22/2017 1049 Last data filed at 01/22/2017 0600 Gross per 24 hour  Intake 1910 ml  Output -  Net 1910 ml   Filed Weights   01/17/17 2326 01/20/17 0428 01/21/17 0610  Weight: 66.4 kg (146 lb 6.2 oz) 66.6 kg (146 lb 13.2 oz) 67.9 kg (149 lb 11.2 oz)    Examination:  General exam: Appears calm and comfortable.  Respiratory system: Bilateral decreased breath sound at bases Cardiovascular system: S1 & S2 heard, rate controlled Gastrointestinal system: Abdomen is nondistended, soft and nontender. Normal bowel sounds heard.  Dressing present Extremities: No cyanosis, clubbing, edema    Data Reviewed: I have personally reviewed following labs and imaging studies  CBC: Recent Labs  Lab 01/17/17 2145 01/19/17 0700 01/20/17 0440 01/21/17 0615 01/22/17 0609  WBC 2.2* 2.3* 2.6* 3.3* 3.2*  NEUTROABS 1.2* 1.4* 1.6* 2.1 1.9  HGB 9.3* 8.2* 8.5* 8.6* 8.1*  HCT 27.9* 24.6* 25.8* 25.3* 24.4*  MCV 96.2 96.9 96.3 95.1 95.3  PLT 162 135* 141* 137* 542*   Basic Metabolic Panel: Recent Labs  Lab  01/17/17 2145 01/18/17 0525 01/19/17 0700 01/20/17 0440 01/21/17 0615 01/22/17 0609  NA 140 136 136 135 133* 136  K 3.4* 3.0* 4.0 3.9 3.8 3.5  CL 100* 99* 105 105 102 105  CO2 32 31 27 25 23 24   GLUCOSE 193* 222* 141* 119* 142* 116*  BUN 26* 20 13 11 12 12   CREATININE 0.73 0.62 0.58* 0.61 0.65 0.60*  CALCIUM 9.1 8.2* 8.3* 8.5* 8.3* 8.4*  MG 1.9  --  1.5* 1.7 1.4* 1.6*  PHOS  --   --    --   --   --  2.3*   GFR: Estimated Creatinine Clearance: 74.3 mL/min (A) (by C-G formula based on SCr of 0.6 mg/dL (L)). Liver Function Tests: Recent Labs  Lab 01/17/17 1331 01/17/17 2145 01/18/17 0525 01/19/17 0700  AST 12 17 12* 12*  ALT 8 10* 9* 8*  ALKPHOS 72 62 52 52  BILITOT 1.1 1.2 0.6 0.5  PROT 6.6 6.5 5.3* 5.3*  ALBUMIN 3.3* 3.2* 2.7* 2.5*   No results for input(s): LIPASE, AMYLASE in the last 168 hours. No results for input(s): AMMONIA in the last 168 hours. Coagulation Profile: No results for input(s): INR, PROTIME in the last 168 hours. Cardiac Enzymes: No results for input(s): CKTOTAL, CKMB, CKMBINDEX, TROPONINI in the last 168 hours. BNP (last 3 results) No results for input(s): PROBNP in the last 8760 hours. HbA1C: No results for input(s): HGBA1C in the last 72 hours. CBG: Recent Labs  Lab 01/20/17 1640 01/20/17 2109 01/21/17 0900 01/21/17 1223 01/21/17 1708  GLUCAP 108* 115* 128* 147* 142*   Lipid Profile: No results for input(s): CHOL, HDL, LDLCALC, TRIG, CHOLHDL, LDLDIRECT in the last 72 hours. Thyroid Function Tests: No results for input(s): TSH, T4TOTAL, FREET4, T3FREE, THYROIDAB in the last 72 hours. Anemia Panel: No results for input(s): VITAMINB12, FOLATE, FERRITIN, TIBC, IRON, RETICCTPCT in the last 72 hours. Sepsis Labs: No results for input(s): PROCALCITON, LATICACIDVEN in the last 168 hours.  Recent Results (from the past 240 hour(s))  Culture, blood (routine x 2)     Status: None (Preliminary result)   Collection Time: 01/17/17  9:40 PM  Result Value Ref Range Status   Specimen Description BLOOD PORTA CATH RIGHT CHEST  Final   Special Requests   Final    BOTTLES DRAWN AEROBIC AND ANAEROBIC Blood Culture adequate volume   Culture   Final    NO GROWTH 3 DAYS Performed at [facility], [address] [place], [place] [address]    Report Status PENDING  Incomplete  Culture, blood (routine x 2)     Status: None (Preliminary  result)   Collection Time: 01/17/17  9:45 PM  Result Value Ref Range Status   Specimen Description BLOOD LEFT ANTECUBITAL  Final   Special Requests   Final    BOTTLES DRAWN AEROBIC AND ANAEROBIC Blood Culture adequate volume   Culture   Final    NO GROWTH 4 DAYS Performed at [facility], [place] [address] [place], [place] [address]    Report Status PENDING  Incomplete         Radiology Studies: No results found.      Scheduled Meds: . atorvastatin  80 mg Oral q1800  . clopidogrel  75 mg Oral Daily  . feeding supplement (ENSURE ENLIVE)  237 mL Oral TID BM  . insulin aspart  0-9 Units Subcutaneous Q6H  . predniSONE  10 mg Oral Q breakfast  . Zinc Oxide  Topical Q2H   Continuous Infusions: . sodium chloride 75 mL/hr at 01/22/17 0421  . sodium chloride    . cefTRIAXone (ROCEPHIN)  IV Stopped (01/21/17 1815)  . [place] KitchenTPN (CLINIMIX-E) Adult     And  . fat emulsion    . magnesium sulfate 1 - 4 g bolus IVPB       LOS: 5 days        Aline August, MD [facility] Pager [phone]  If 7PM-7AM, please contact night-coverage [email or web address] Password [facility] 01/22/2017, 10:49 AM

## 2017-01-22 NOTE — Care Management Important Message (Signed)
Important Message  Patient Details  Name: Andres Smith MRN: [id number] Date of Birth: 07-02-1940   Medicare Important Message Given:  Yes    Kerin Salen 01/22/2017, 11:36 AMImportant Message  Patient Details  Name: Andres Smith MRN: [id number] Date of Birth: [birth year]   Medicare Important Message Given:  Yes    Kerin Salen 01/22/2017, 11:36 AM

## 2017-01-23 ENCOUNTER — Ambulatory Visit: Payer: PPO | Admitting: Nurse Practitioner

## 2017-01-23 DIAGNOSIS — K5090 Crohn's disease, unspecified, without complications: Secondary | ICD-10-CM

## 2017-01-23 DIAGNOSIS — Z7189 Other specified counseling: Secondary | ICD-10-CM

## 2017-01-23 LAB — COMPREHENSIVE METABOLIC PANEL
ALK PHOS: 47 U/L (ref 38–126)
ALT: 8 U/L — ABNORMAL LOW (ref 17–63)
AST: 14 U/L — AB (ref 15–41)
Albumin: 2.1 g/dL — ABNORMAL LOW (ref 3.5–5.0)
Anion gap: 7 (ref 5–15)
BUN: 11 mg/dL (ref 6–20)
CHLORIDE: 101 mmol/L (ref 101–111)
CO2: 26 mmol/L (ref 22–32)
CREATININE: 0.56 mg/dL — AB (ref 0.61–1.24)
Calcium: 8.2 mg/dL — ABNORMAL LOW (ref 8.9–10.3)
GFR calc Af Amer: >60 mL/min (ref >60)
GFR calc non Af Amer: >60 mL/min (ref >60)
GLUCOSE: 235 mg/dL — AB (ref 65–99)
Potassium: 3.4 mmol/L — ABNORMAL LOW (ref 3.5–5.1)
SODIUM: 134 mmol/L — AB (ref 135–145)
Total Bilirubin: 0.4 mg/dL (ref 0.3–1.2)
Total Protein: 4.9 g/dL — ABNORMAL LOW (ref 6.5–8.1)

## 2017-01-23 LAB — CBC WITH DIFFERENTIAL/PLATELET
Basophils Absolute: 0 10*3/uL (ref 0.0–0.1)
Basophils Relative: 0 %
EOS ABS: 0 10*3/uL (ref 0.0–0.7)
EOS PCT: 1 %
HCT: 23.9 % — ABNORMAL LOW (ref 39.0–52.0)
Hemoglobin: 8.2 g/dL — ABNORMAL LOW (ref 13.0–17.0)
LYMPHS ABS: 0.7 10*3/uL (ref 0.7–4.0)
Lymphocytes Relative: 18 %
MCH: 32.2 pg (ref 26.0–34.0)
MCHC: 34.3 g/dL (ref 30.0–36.0)
MCV: 93.7 fL (ref 78.0–100.0)
MONO ABS: 0.8 10*3/uL (ref 0.1–1.0)
MONOS PCT: 20 %
Neutro Abs: 2.5 10*3/uL (ref 1.7–7.7)
Neutrophils Relative %: 61 %
PLATELETS: 138 10*3/uL — AB (ref 150–400)
RBC: 2.55 MIL/uL — ABNORMAL LOW (ref 4.22–5.81)
RDW: 15.9 % — AB (ref 11.5–15.5)
WBC: 4 10*3/uL (ref 4.0–10.5)

## 2017-01-23 LAB — GLUCOSE, CAPILLARY
Glucose-Capillary: 190 mg/dL — ABNORMAL HIGH (ref 65–99)
Glucose-Capillary: 226 mg/dL — ABNORMAL HIGH (ref 65–99)
Glucose-Capillary: 298 mg/dL — ABNORMAL HIGH (ref 65–99)
Glucose-Capillary: 335 mg/dL — ABNORMAL HIGH (ref 65–99)

## 2017-01-23 LAB — TRIGLYCERIDES: Triglycerides: 91 mg/dL (ref <150)

## 2017-01-23 LAB — PREALBUMIN: Prealbumin: <5 mg/dL — ABNORMAL LOW (ref 18–38)

## 2017-01-23 LAB — PHOSPHORUS: Phosphorus: 2.2 mg/dL — ABNORMAL LOW (ref 2.5–4.6)

## 2017-01-23 LAB — CULTURE, BLOOD (ROUTINE X 2)
Culture: NO GROWTH
Special Requests: ADEQUATE

## 2017-01-23 LAB — TYPE AND SCREEN
ABO/RH(D): A POS
Antibody Screen: NEGATIVE

## 2017-01-23 LAB — MAGNESIUM: MAGNESIUM: 1.5 mg/dL — AB (ref 1.7–2.4)

## 2017-01-23 MED ORDER — FAT EMULSION 20 % IV EMUL
240.0000 mL | INTRAVENOUS | Status: AC
  Administered 2017-01-23: 240 mL via INTRAVENOUS
  Filled 2017-01-23: qty 250

## 2017-01-23 MED ORDER — POTASSIUM PHOSPHATES 15 MMOLE/5ML IV SOLN
30.0000 mmol | Freq: Once | INTRAVENOUS | Status: AC
  Administered 2017-01-23: 30 mmol via INTRAVENOUS
  Filled 2017-01-23: qty 10

## 2017-01-23 MED ORDER — POTASSIUM CHLORIDE 10 MEQ/100ML IV SOLN
10.0000 meq | INTRAVENOUS | Status: AC
  Administered 2017-01-23 (×3): 10 meq via INTRAVENOUS
  Filled 2017-01-23 (×3): qty 100

## 2017-01-23 MED ORDER — FAMOTIDINE IN NACL 20-0.9 MG/50ML-% IV SOLN
20.0000 mg | INTRAVENOUS | Status: DC
  Administered 2017-01-23 – 2017-02-10 (×19): 20 mg via INTRAVENOUS
  Filled 2017-01-23 (×21): qty 50

## 2017-01-23 MED ORDER — TRACE MINERALS CR-CU-MN-SE-ZN 10-1000-500-60 MCG/ML IV SOLN
INTRAVENOUS | Status: AC
Start: 2017-01-23 — End: 2017-01-24
  Administered 2017-01-23: 17:00:00 via INTRAVENOUS
  Filled 2017-01-23: qty 960

## 2017-01-23 MED ORDER — MAGNESIUM SULFATE 4 GM/100ML IV SOLN
4.0000 g | Freq: Once | INTRAVENOUS | Status: AC
  Administered 2017-01-23: 4 g via INTRAVENOUS
  Filled 2017-01-23: qty 100

## 2017-01-23 MED ORDER — DEXAMETHASONE SODIUM PHOSPHATE 4 MG/ML IJ SOLN
2.0000 mg | INTRAMUSCULAR | Status: DC
  Administered 2017-01-23 – 2017-01-28 (×6): 2 mg via INTRAVENOUS
  Filled 2017-01-23 (×6): qty 1

## 2017-01-23 MED ORDER — PANTOPRAZOLE SODIUM 40 MG IV SOLR
40.0000 mg | INTRAVENOUS | Status: DC
  Administered 2017-01-23 – 2017-02-11 (×20): 40 mg via INTRAVENOUS
  Filled 2017-01-23 (×19): qty 40

## 2017-01-23 NOTE — Progress Notes (Signed)
Pt refusing dressing change at this time

## 2017-01-23 NOTE — Progress Notes (Signed)
[place] NOTE   Pharmacy Consult for short-term trial of TPN for 7 days. Indication: unable to tolerate PO intake  Patient Measurements: Body mass index is 22.76 kg/m. Filed Weights   01/17/17 2326 01/20/17 0428 01/21/17 0610  Weight: 146 lb 6.2 oz (66.4 kg) 146 lb 13.2 oz (66.6 kg) 149 lb 11.2 oz (67.9 kg)  Ideal Body Weight:  70 kg  HPI:  89 yoM admitted on 1/17 with c/o G-tube leaking blood and gastric contents around exit site causing erosion of surrounding skin.  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  IR converted to larger G-J tube on 1/15, but it has continued to leak.  On 1/19 the [place] tube was permanently removed without complications.  He continues to have leakage through the non-healing tube site and is therefore unable to meet nutrition goals with oral diet. Pharmacy is consulted for trial of short-term TPN to provide nutrition and avoid oral intake that is exacerbating the healing process around his feeding tube site.  Significant events:   Insulin requirements past 24 hours: 5 units sensitive SSI (no hx DM)  Current Nutrition: NPO  Ensure supplements ordered  IVF: NS at 35 ml/hr  Central access: Implanted port (05/21/16) TPN start date: 01/22/17  ASSESSMENT                                                                                                          Today:   Glucose:  goal range < 150.  Elevated CBG 190, elevated glucose on BMET at 235, monitor SSI use.  Electrolytes:  Na, K, Mag 1.5, and Phos 2.2 are low despite replacement on 1/22.  Monitor for refeeding syndrome.  Renal:  SCr 0.6  LFTs:  Low/stable  TGs:  91 (1/23)  Prealbumin:  <5 (1/22)   NUTRITIONAL GOALS                                                                                             RD recs (1/18):  110-120 Protein, 2350-2550  Kcal  Clinimix 5/20 at a goal rate of 83 ml/hr + 20% fat emulsion 240 ml/day to provide: 100 g of protein and 2233 kCals per day meeting 91 % of protein and 95 % of kCal needs  - Glucose infusion rate will be 4.07 mg/kg/min (Maximum 5 mg/kg/min)   PLAN  Now: Magnesium 4g IV x1 dose KPhos 30 mmol IV x1 dose  (also provides ~44 mEq K) KCl 10 mEq IV x3 runs per MD.  At 1800 today:  Continue Clinimix E 5/20 at 40 ml/hr - do NOT advance rate until electrolytes normalized d/t refeeding risk.  20% fat emulsion at 20 ml/hr x12 hours/day  TPN to contain standard multivitamins and trace element daily.  Continue IVF at 35 ml/hr.  Continue CBGs and sensitive SSI q6h.   TPN lab panels on Mondays & Thursdays.  Monitor for re-feeding syndrome: check BMET, Mag, Phos daily x3 days    Gretta Arab PharmD, BCPS Pager [phone] 01/23/2017 9:52 AM

## 2017-01-23 NOTE — Progress Notes (Signed)
Pt's dressing to old peg tube site changed as per order.

## 2017-01-23 NOTE — Progress Notes (Signed)
ROMAINE NEVILLE   DOB:[birth year]   RC#:[id number]    Subjective: The patient is miserable.  He is almost completely immobile because activity appears to exacerbate leakage of gastric content that burns his skin.  It happened overnight and it caused significant pain.  He has not been able to have much physical therapy.  TPN was started yesterday.  He has no bowel movement for 6 days.  He is still having passage of flatus  Objective:  Vitals:   01/22/17 2054 01/23/17 0533  BP: 127/68 130/74  Pulse: 93 91  Resp: 16 16  Temp: 98 F (36.7 C) 98.5 F (36.9 C)  SpO2: 97% 96%     Intake/Output Summary (Last 24 hours) at 01/23/2017 0810 Last data filed at 01/23/2017 0055 Gross per 24 hour  Intake 0 ml  Output 500 ml  Net -500 ml    GENERAL:alert, no distress and comfortable.  He is thin and cachectic SKIN: The skin around his feeding tube site is bandaged.  I did not remove it EYES: normal, Conjunctiva are pink and non-injected, sclera clear OROPHARYNX:no exudate, no erythema and lips, buccal mucosa, and tongue normal  NECK: supple, thyroid normal size, non-tender, without nodularity LYMPH:  no palpable lymphadenopathy in the cervical, axillary or inguinal LUNGS: clear to auscultation and percussion with normal breathing effort HEART: regular rate & rhythm and no murmurs and no lower extremity edema ABDOMEN:abdomen soft, non-tender and normal bowel sounds Musculoskeletal:no cyanosis of digits and no clubbing  NEURO: alert & oriented x 3 with fluent speech, no focal motor/sensory deficits   Labs:  Lab Results  Component Value Date   WBC 4.0 01/23/2017   HGB 8.2 (L) 01/23/2017   HCT 23.9 (L) 01/23/2017   MCV 93.7 01/23/2017   PLT 138 (L) 01/23/2017   NEUTROABS 2.5 01/23/2017    Lab Results  Component Value Date   NA 134 (L) 01/23/2017   K 3.4 (L) 01/23/2017   CL 101 01/23/2017   CO2 26 01/23/2017    Assessment & Plan:   Buccal mucosa squamous cell carcinoma (HCC) He tolerated  chemotherapy wellexcept for progressive weight loss and lack of appetite Due to decline of performance status, he is not a candidate for further chemotherapy right now. We will continue supportive care  Acquired pancytopenia [facility]) He has acquired pancytopenia due to chemotherapy He is not symptomatic Observe only. He does not need G-CSF support orblood transfusion  Cancer associated pain The patient has intermittent rib pain Low-dose tramadol appears to control his pain well We will give him intermittent IV morphine as needed  Dysphagia, leaking around feeding tube Dysphagia is stable He is able to swallow food but not motivated eat enoughand maintain his caloric intake. Whenever he tries to eat, thereis leakage of nutritional supplement near the feeding tube site which is not unusual.   Typically, I would expect the feeding tube site to close within 2-3 days.  However, due to his severe malnutrition and chronic prednisone use, that healing is taking longer time than expected. Unfortunately, he is not making any progress with oral intake due to the leak. He was started on TPN on January 22, 2017 I will change his intake to n.p.o. status but allowing sips of water only to prevent further leakage around his feeding tube.  Slow wound healing around feeding tube site Appreciate care from wound care nurse I think that is a component of reflux that exacerbate leakage of gastric content I will start him on proton pump  inhibitor in the morning and Pepcid at night  Severe protein calorie malnutrition He is on TPN We will watch out for refeeding syndrome He will continue electrolyte replacement therapy as needed  Chronic history of Crohn's disease He is on low-dose prednisone in the outpatient clinic I will switch him to low-dose IV dexamethasone in the short-term He is currently constipated.  Will initiate laxative therapy once we are allowing him oral intake down the  road  Profound weakness The patient has not been moving because of fear of exacerbating leakage of gastric content and pain I spent a lot of time educating the patient and the nursing staff and encouraged him to get up and sit on the chair at least 1 hour a day and I encouraged him to participate with physical therapy to keep up his strength while he is hospitalized  Goals of care I have some discussion with his wife.  She is afraid that her husband is not going to make it and he is actively dying from malnutrition. I recommend short-term trial of TPN for 7 days. If he has no meaningful improvement beyond 7 days, we will have further family discussion about stopping artificial feeding.  Discharge planning He is not ready to be discharged    [name], MD 01/23/2017  8:10 AM

## 2017-01-23 NOTE — Progress Notes (Signed)
Pt's dressing to peg tube site changed as per order. Slight increase in drainage on old dressing.

## 2017-01-23 NOTE — Consult Note (Signed)
[place] Nurse wound follow up Wound type: Previous gastrostomy site Measurement:2.5cm x 2.2cm Wound bed: red Drainage (amount, consistency, odor) copious.Marland Kitchen Periwound: The site directly over the opening is completely raw with very thin layer of slough.  Dressing procedure/placement/frequency: Removed dressing, cleansed area. The perimeter where the paste is, the skin is improved.  The square under the Aquacel Ag+ is worsened from when this RN saw it. I have had the patient sit up so I could collect the drainage as it ran out.  Then cleansed and applied Zinc around opening, topped with two pieces of Aquacel Ag+, dry gauze, paper tape. The bedside RN was present and she will note with next dressing change if the skin under the Aquacel has Zinc or not.  Not sure if Aquacel Ag+ is soaking the zinc up or if the drainage is eating through it, or if the previous nurse put zinc around the Aquacel.  When old dressing was removed there was an exact square where there was no zinc right under the Aquacel. Skin surrounding opening definitely not at a point where a pouch can be used. Will continue to follow and remain available for this patient, the nursing and medical teams.   Fara Olden, RN-C, WTA-C, [place] Wound Treatment Associate Ostomy Care Associate

## 2017-01-23 NOTE — Progress Notes (Signed)
Pt yelling out in pain-states skin around peg tube site is burning.  Medicated with morphine 1 mg and dressing changed again.  Fluid leaking from peg tube site.  Unable to apply dressing due to constant leaking.pt denies drinking any liquids.  Soaked up drainage with sterile 4 x 4s for approximately 15 min til drainage slowed down. New dressing applied .

## 2017-01-23 NOTE — Progress Notes (Signed)
Pt's dressing to eg tube site changed per order. Minimal drainage to old dressing.

## 2017-01-23 NOTE — Progress Notes (Signed)
PROGRESS NOTE    Andres Smith  YWV:[id number] DOB: [birth year] DOA: 01/17/2017 PCP: Janith Lima, MD   Brief Narrative: Patient is a 77 year old male with past medical history of buccal mucosa squamous cell cancer, dysphagia with G-tube in place, atrial fibrillation, coronary artery disease status post CABG, recent CVA was admitted for the evaluation of malfunction of his G-tube and possible abdominal cellulitis.  Assessment & Plan:   Active Problems:   PEG tube malfunction (HCC)   Protein-calorie malnutrition, severe  G-tube malfunction with abdominal cellulitis: G-tube removed by IR. Vancomycin switched to Rocephin for cellulitis.  Cultures were negative. Continue wound care. Has been passing flatus but has not had a bowel movement. Currently NPO Wound care following.  Continue PPI  Moderate to severe protein calorie malnutrition/failure to thrive: Patient was afraid to eat because of pain/leakage of the gastric contents from the previous feeding tube site. Started on TPN on 01/22/17.  Pharmacy on board.  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 Alvy Bimler .  Not a candidate for chemotherapy now. Currently he can swallow food.  History of recent CVA: On Plavix.  Continue statin  Coronary artery disease: Status post CABG.  Stable  Hypertension: Currently blood pressure stable.  We will continue to monitor.  Crohn's disease: Steroids ordered by oncology.  Continue supportive care.  Hypokalemia: Supplemented today.  We will continue to monitor the levels.  Pancytopneia: Secondary to chemotherapy.  We will continue to monitor the counts.  Generalized weakness: Physical therapy ordered.  Patient attempting to ambulate.      DVT prophylaxis: ScD Code Status: Partial code.  No intubation.  CPR okay Family Communication: None present at the bedside Disposition Plan: Home once clinically improves   Consultants: IR,  oncology  Procedures: None  Antimicrobials: Ceftriaxone since 01/20/17 Vancomycin 01/17/17-01/20/17  Subjective: Patient seen and examined the bedside this morning.  Remains comfortable.  Does not complain of any nausea, vomiting or abdominal pain.  No overnight fever.  Continues to attempt for ambulation.  Objective: Vitals:   01/22/17 1554 01/22/17 2054 01/23/17 0533 01/23/17 1243  BP: 122/63 127/68 130/74 108/70  Pulse: 97 93 91 92  Resp: 20 16 16 16   Temp: 98 F (36.7 C) 98 F (36.7 C) 98.5 F (36.9 C) 97.7 F (36.5 C)  TempSrc: Oral Oral Oral Oral  SpO2: 97% 97% 96% 97%  Weight:      Height:        Intake/Output Summary (Last 24 hours) at 01/23/2017 1639 Last data filed at 01/23/2017 0055 Gross per 24 hour  Intake -  Output 300 ml  Net -300 ml   Filed Weights   01/17/17 2326 01/20/17 0428 01/21/17 0610  Weight: 66.4 kg (146 lb 6.2 oz) 66.6 kg (146 lb 13.2 oz) 67.9 kg (149 lb 11.2 oz)    Examination:  General exam: Appears calm and comfortable ,Not in distress,average built Respiratory system: Bilateral equal air entry, normal vesicular breath sounds, no wheezes or crackles  Cardiovascular system: S1 & S2 heard, RRR. No JVD, murmurs, rubs, gallops or clicks. No pedal edema. Gastrointestinal system: Abdomen is nondistended, soft and nontender. No organomegaly or masses felt. Normal bowel sounds heard.  Previous G-tube site covered with dressing. Central nervous system: Alert and oriented. No focal neurological deficits. Extremities: No edema, no clubbing ,no cyanosis, distal peripheral pulses palpable. Skin: No rashes, lesions or ulcers,no icterus ,no pallor Psychiatry: Judgement and insight appear normal. Mood & affect appropriate.  Data Reviewed: I have personally reviewed following labs and imaging studies  CBC: Recent Labs  Lab 01/19/17 0700 01/20/17 0440 01/21/17 0615 01/22/17 0609 01/23/17 0530  WBC 2.3* 2.6* 3.3* 3.2* 4.0  NEUTROABS 1.4* 1.6* 2.1  1.9 2.5  HGB 8.2* 8.5* 8.6* 8.1* 8.2*  HCT 24.6* 25.8* 25.3* 24.4* 23.9*  MCV 96.9 96.3 95.1 95.3 93.7  PLT 135* 141* 137* 132* 629*   Basic Metabolic Panel: Recent Labs  Lab 01/19/17 0700 01/20/17 0440 01/21/17 0615 01/22/17 0609 01/23/17 0530  NA 136 135 133* 136 134*  K 4.0 3.9 3.8 3.5 3.4*  CL 105 105 102 105 101  CO2 27 25 23 24 26   GLUCOSE 141* 119* 142* 116* 235*  BUN 13 11 12 12 11   CREATININE 0.58* 0.61 0.65 0.60* 0.56*  CALCIUM 8.3* 8.5* 8.3* 8.4* 8.2*  MG 1.5* 1.7 1.4* 1.6* 1.5*  PHOS  --   --   --  2.3* 2.2*   GFR: Estimated Creatinine Clearance: 74.3 mL/min (A) (by C-G formula based on SCr of 0.56 mg/dL (L)). Liver Function Tests: Recent Labs  Lab 01/17/17 1331 01/17/17 2145 01/18/17 0525 01/19/17 0700 01/23/17 0530  AST 12 17 12* 12* 14*  ALT 8 10* 9* 8* 8*  ALKPHOS 72 62 52 52 47  BILITOT 1.1 1.2 0.6 0.5 0.4  PROT 6.6 6.5 5.3* 5.3* 4.9*  ALBUMIN 3.3* 3.2* 2.7* 2.5* 2.1*   No results for input(s): LIPASE, AMYLASE in the last 168 hours. No results for input(s): AMMONIA in the last 168 hours. Coagulation Profile: No results for input(s): INR, PROTIME in the last 168 hours. Cardiac Enzymes: No results for input(s): CKTOTAL, CKMB, CKMBINDEX, TROPONINI in the last 168 hours. BNP (last 3 results) No results for input(s): PROBNP in the last 8760 hours. HbA1C: No results for input(s): HGBA1C in the last 72 hours. CBG: Recent Labs  Lab 01/21/17 1223 01/21/17 1708 01/22/17 1321 01/23/17 0054 01/23/17 1200  GLUCAP 147* 142* 109* 190* 226*   Lipid Profile: Recent Labs    01/23/17 0530  TRIG 91   Thyroid Function Tests: No results for input(s): TSH, T4TOTAL, FREET4, T3FREE, THYROIDAB in the last 72 hours. Anemia Panel: No results for input(s): VITAMINB12, FOLATE, FERRITIN, TIBC, IRON, RETICCTPCT in the last 72 hours. Sepsis Labs: No results for input(s): PROCALCITON, LATICACIDVEN in the last 168 hours.  Recent Results (from the past 240  hour(s))  Culture, blood (routine x 2)     Status: None   Collection Time: 01/17/17  9:40 PM  Result Value Ref Range Status   Specimen Description BLOOD PORTA CATH RIGHT CHEST  Final   Special Requests   Final    BOTTLES DRAWN AEROBIC AND ANAEROBIC Blood Culture adequate volume   Culture   Final    NO GROWTH 5 DAYS Performed at [facility], [address], [place], [place] [address]    Report Status 01/23/2017 FINAL  Final  Culture, blood (routine x 2)     Status: None   Collection Time: 01/17/17  9:45 PM  Result Value Ref Range Status   Specimen Description BLOOD LEFT ANTECUBITAL  Final   Special Requests   Final    BOTTLES DRAWN AEROBIC AND ANAEROBIC Blood Culture adequate volume   Culture   Final    NO GROWTH 5 DAYS Performed at [facility], [place] [address] [place], [place] [address]    Report Status 01/22/2017 FINAL  Final  Radiology Studies: No results found.      Scheduled Meds: . clopidogrel  75 mg Oral Daily  . dexamethasone  2 mg Intravenous Q24H  . feeding supplement (ENSURE ENLIVE)  237 mL Oral TID BM  . insulin aspart  0-9 Units Subcutaneous Q6H  . pantoprazole (PROTONIX) IV  40 mg Intravenous Q24H  . Zinc Oxide   Topical Q2H   Continuous Infusions: . sodium chloride 35 mL/hr at 01/23/17 0653  . cefTRIAXone (ROCEPHIN)  IV Stopped (01/22/17 1710)  . famotidine (PEPCID) IV    . Marland KitchenTPN (CLINIMIX-E) Adult     And  . fat emulsion    . potassium chloride 10 mEq (01/23/17 1552)  . potassium PHOSPHATE IVPB (mmol)    . Marland KitchenTPN (CLINIMIX-E) Adult 40 mL/hr at 01/22/17 1736     LOS: 6 days    Time spent: 25 mins    Percival Glasheen Jodie Echevaria, MD [facility] Pager [phone]  If 7PM-7AM, please contact night-coverage [email or web address] Password [facility] 01/23/2017, 4:39 PM

## 2017-01-24 ENCOUNTER — Encounter (HOSPITAL_COMMUNITY): Payer: Self-pay | Admitting: *Deleted

## 2017-01-24 DIAGNOSIS — R531 Weakness: Secondary | ICD-10-CM

## 2017-01-24 LAB — GLUCOSE, CAPILLARY
GLUCOSE-CAPILLARY: 251 mg/dL — AB (ref 65–99)
GLUCOSE-CAPILLARY: 219 mg/dL — AB (ref 65–99)
Glucose-Capillary: 266 mg/dL — ABNORMAL HIGH (ref 65–99)
Glucose-Capillary: 220 mg/dL — ABNORMAL HIGH (ref 65–99)

## 2017-01-24 LAB — COMPREHENSIVE METABOLIC PANEL
ALK PHOS: 54 U/L (ref 38–126)
ALT: 7 U/L — ABNORMAL LOW (ref 17–63)
ANION GAP: 7 (ref 5–15)
AST: 15 U/L (ref 15–41)
Albumin: 2.3 g/dL — ABNORMAL LOW (ref 3.5–5.0)
BILIRUBIN TOTAL: 0.4 mg/dL (ref 0.3–1.2)
BUN: 10 mg/dL (ref 6–20)
CALCIUM: 8.4 mg/dL — AB (ref 8.9–10.3)
CO2: 26 mmol/L (ref 22–32)
Chloride: 100 mmol/L — ABNORMAL LOW (ref 101–111)
Creatinine, Ser: 0.6 mg/dL — ABNORMAL LOW (ref 0.61–1.24)
Glucose, Bld: 267 mg/dL — ABNORMAL HIGH (ref 65–99)
Potassium: 3.7 mmol/L (ref 3.5–5.1)
Sodium: 133 mmol/L — ABNORMAL LOW (ref 135–145)
TOTAL PROTEIN: 5.4 g/dL — AB (ref 6.5–8.1)

## 2017-01-24 LAB — PHOSPHORUS: Phosphorus: 2.6 mg/dL (ref 2.5–4.6)

## 2017-01-24 LAB — MAGNESIUM: Magnesium: 2 mg/dL (ref 1.7–2.4)

## 2017-01-24 MED ORDER — FAT EMULSION 20 % IV EMUL
240.0000 mL | INTRAVENOUS | Status: AC
Start: 2017-01-24 — End: 2017-01-25
  Administered 2017-01-24: 240 mL via INTRAVENOUS
  Filled 2017-01-24: qty 240

## 2017-01-24 MED ORDER — INSULIN GLARGINE 100 UNIT/ML ~~LOC~~ SOLN: 10.0000 [IU] | Freq: Every day | SUBCUTANEOUS | Status: DC

## 2017-01-24 MED ORDER — TRACE MINERALS CR-CU-MN-SE-ZN 10-1000-500-60 MCG/ML IV SOLN
INTRAVENOUS | Status: AC
  Administered 2017-01-24: 17:00:00 via INTRAVENOUS
  Filled 2017-01-24: qty 960

## 2017-01-24 MED ORDER — SODIUM CHLORIDE 0.9% FLUSH
10.0000 mL | INTRAVENOUS | Status: DC | PRN
  Administered 2017-01-28 – 2017-02-08 (×2): 10 mL
  Filled 2017-01-24 (×2): qty 40

## 2017-01-24 MED ORDER — ZINC OXIDE 12.8 % EX OINT
TOPICAL_OINTMENT | CUTANEOUS | Status: DC
  Administered 2017-01-24 (×3): via TOPICAL
  Administered 2017-01-25 (×2): 1 via TOPICAL
  Administered 2017-01-25 (×2): via TOPICAL
  Administered 2017-01-25: 1 via TOPICAL
  Administered 2017-01-26 – 2017-01-30 (×25): via TOPICAL
  Administered 2017-01-30: 1 via TOPICAL
  Administered 2017-01-31 – 2017-02-07 (×42): via TOPICAL
  Administered 2017-02-07: 1 via TOPICAL
  Administered 2017-02-07 – 2017-02-08 (×3): via TOPICAL
  Administered 2017-02-08: 1 via TOPICAL
  Administered 2017-02-08 – 2017-02-11 (×14): via TOPICAL
  Administered 2017-02-11: 1 via TOPICAL
  Administered 2017-02-11 – 2017-02-12 (×3): via TOPICAL
  Administered 2017-02-12: 1 via TOPICAL
  Administered 2017-02-12 – 2017-02-13 (×4): via TOPICAL
  Administered 2017-02-13: 1 via TOPICAL
  Administered 2017-02-13: 13:00:00 via TOPICAL
  Administered 2017-02-13: 1 via TOPICAL
  Administered 2017-02-13: 08:00:00 via TOPICAL
  Filled 2017-01-24 (×2): qty 56.7

## 2017-01-24 MED ORDER — INSULIN ASPART 100 UNIT/ML ~~LOC~~ SOLN
0.0000 [IU] | SUBCUTANEOUS | Status: DC
  Administered 2017-01-24: 11 [IU] via SUBCUTANEOUS
  Administered 2017-01-24 (×2): 7 [IU] via SUBCUTANEOUS
  Administered 2017-01-25: 3 [IU] via SUBCUTANEOUS
  Administered 2017-01-25: 7 [IU] via SUBCUTANEOUS
  Administered 2017-01-25: 3 [IU] via SUBCUTANEOUS
  Administered 2017-01-25: 7 [IU] via SUBCUTANEOUS
  Administered 2017-01-25: 4 [IU] via SUBCUTANEOUS
  Administered 2017-01-25: 11 [IU] via SUBCUTANEOUS
  Administered 2017-01-26: 4 [IU] via SUBCUTANEOUS
  Administered 2017-01-26: 7 [IU] via SUBCUTANEOUS
  Administered 2017-01-26: 3 [IU] via SUBCUTANEOUS
  Administered 2017-01-26: 7 [IU] via SUBCUTANEOUS
  Administered 2017-01-26: 3 [IU] via SUBCUTANEOUS
  Administered 2017-01-26: 7 [IU] via SUBCUTANEOUS
  Administered 2017-01-27: 3 [IU] via SUBCUTANEOUS
  Administered 2017-01-27 (×2): 4 [IU] via SUBCUTANEOUS
  Administered 2017-01-27: 7 [IU] via SUBCUTANEOUS
  Administered 2017-01-27: 11 [IU] via SUBCUTANEOUS
  Administered 2017-01-27: 3 [IU] via SUBCUTANEOUS
  Administered 2017-01-28 (×3): 4 [IU] via SUBCUTANEOUS
  Administered 2017-01-28: 3 [IU] via SUBCUTANEOUS
  Administered 2017-01-28: 7 [IU] via SUBCUTANEOUS
  Administered 2017-01-29: 4 [IU] via SUBCUTANEOUS
  Administered 2017-01-29: 7 [IU] via SUBCUTANEOUS
  Administered 2017-01-29 (×2): 4 [IU] via SUBCUTANEOUS
  Administered 2017-01-29: 3 [IU] via SUBCUTANEOUS
  Administered 2017-01-29: 4 [IU] via SUBCUTANEOUS
  Administered 2017-01-30: 3 [IU] via SUBCUTANEOUS
  Administered 2017-01-30: 4 [IU] via SUBCUTANEOUS
  Administered 2017-01-30: 3 [IU] via SUBCUTANEOUS
  Administered 2017-01-30: 4 [IU] via SUBCUTANEOUS
  Administered 2017-01-30: 7 [IU] via SUBCUTANEOUS
  Administered 2017-01-30: 4 [IU] via SUBCUTANEOUS
  Administered 2017-01-31 (×4): 3 [IU] via SUBCUTANEOUS
  Administered 2017-02-01 (×2): 4 [IU] via SUBCUTANEOUS
  Administered 2017-02-01: 3 [IU] via SUBCUTANEOUS
  Administered 2017-02-01: 4 [IU] via SUBCUTANEOUS
  Administered 2017-02-02 (×2): 3 [IU] via SUBCUTANEOUS
  Administered 2017-02-02: 7 [IU] via SUBCUTANEOUS
  Administered 2017-02-02: 4 [IU] via SUBCUTANEOUS
  Administered 2017-02-02: 100 [IU] via SUBCUTANEOUS
  Administered 2017-02-03 (×2): 3 [IU] via SUBCUTANEOUS
  Administered 2017-02-03: 7 [IU] via SUBCUTANEOUS
  Administered 2017-02-03: 4 [IU] via SUBCUTANEOUS
  Administered 2017-02-04 (×3): 3 [IU] via SUBCUTANEOUS
  Administered 2017-02-04: 11 [IU] via SUBCUTANEOUS
  Administered 2017-02-04 (×2): 3 [IU] via SUBCUTANEOUS
  Administered 2017-02-05 (×2): 4 [IU] via SUBCUTANEOUS
  Administered 2017-02-05: 7 [IU] via SUBCUTANEOUS
  Administered 2017-02-05: 3 [IU] via SUBCUTANEOUS
  Administered 2017-02-05: 4 [IU] via SUBCUTANEOUS
  Administered 2017-02-06: 3 [IU] via SUBCUTANEOUS
  Administered 2017-02-06: 7 [IU] via SUBCUTANEOUS
  Administered 2017-02-06: 4 [IU] via SUBCUTANEOUS
  Administered 2017-02-06: 3 [IU] via SUBCUTANEOUS
  Administered 2017-02-07: 4 [IU] via SUBCUTANEOUS
  Administered 2017-02-07: 11 [IU] via SUBCUTANEOUS
  Administered 2017-02-07: 4 [IU] via SUBCUTANEOUS
  Administered 2017-02-07 (×2): 3 [IU] via SUBCUTANEOUS
  Administered 2017-02-08 (×2): 4 [IU] via SUBCUTANEOUS
  Administered 2017-02-08: 3 [IU] via SUBCUTANEOUS
  Administered 2017-02-08: 7 [IU] via SUBCUTANEOUS
  Administered 2017-02-08: 4 [IU] via SUBCUTANEOUS
  Administered 2017-02-08 – 2017-02-09 (×2): 3 [IU] via SUBCUTANEOUS
  Administered 2017-02-09: 4 [IU] via SUBCUTANEOUS
  Administered 2017-02-09: 3 [IU] via SUBCUTANEOUS
  Administered 2017-02-09 (×2): 4 [IU] via SUBCUTANEOUS
  Administered 2017-02-09: 3 [IU] via SUBCUTANEOUS
  Administered 2017-02-10: 4 [IU] via SUBCUTANEOUS
  Administered 2017-02-10: 2 [IU] via SUBCUTANEOUS
  Administered 2017-02-10: 3 [IU] via SUBCUTANEOUS
  Administered 2017-02-10 (×2): 4 [IU] via SUBCUTANEOUS
  Administered 2017-02-11: 3 [IU] via SUBCUTANEOUS
  Administered 2017-02-11 – 2017-02-12 (×3): 4 [IU] via SUBCUTANEOUS
  Administered 2017-02-12 (×2): 3 [IU] via SUBCUTANEOUS
  Administered 2017-02-12 – 2017-02-13 (×2): 4 [IU] via SUBCUTANEOUS
  Administered 2017-02-13: 3 [IU] via SUBCUTANEOUS

## 2017-01-24 NOTE — Progress Notes (Signed)
PROGRESS NOTE    Andres Smith  MGQ:[id number] DOB: 09-Jul-1940 DOA: 01/17/2017 PCP: Janith Lima, MD   Brief Narrative: Patient is a 77 year old male with past medical history of buccal mucosa squamous cell cancer, dysphagia with G-tube in place, atrial fibrillation, coronary artery disease status post CABG, recent CVA was admitted for the evaluation of malfunction of his G-tube and possible abdominal cellulitis.  Assessment & Plan:   Active Problems:   PEG tube malfunction (HCC)   Protein-calorie malnutrition, severe  G-tube malfunction with abdominal cellulitis: G-tube removed by IR. Vancomycin switched to Rocephin for cellulitis.  Cultures were negative. Continue wound care. Has been passing flatus and also had  a bowel movement today. Currently NPO Wound care following.  Continue PPI  Moderate to severe protein calorie malnutrition/failure to thrive: Patient was afraid to eat because of pain/leakage of the gastric contents from the previous feeding tube site. Started on TPN on 01/22/17.  Pharmacy on board.  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 Alvy Bimler .  Not a candidate for chemotherapy now. Currently he can swallow food but afraid to eat due to abdominal pain and gastric leakage.  History of recent CVA: On Plavix.  Continue statin  Coronary artery disease: Status post CABG.  Stable  Hypertension: Currently blood pressure stable.  We will continue to monitor.  Crohn's disease: Steroids ordered by oncology.  Continue supportive care.  Hypokalemia: Supplemented and corrected.  We will continue to monitor the levels.  Pancytopneia: Secondary to chemotherapy.  We will continue to monitor the counts.  Generalized weakness: Physical therapy ordered.  Patient is ambulating.  Hyperglycemia: Continue sliding scale insulin.  Start on low-dose Lantus      DVT prophylaxis: ScD Code Status: Partial code.  No intubation.  CPR  okay Family Communication: Family member  present at the bedside Disposition Plan: Home once clinically improves   Consultants: IR, oncology  Procedures: None  Antimicrobials: Ceftriaxone since 01/20/17 Vancomycin 01/17/17-01/20/17  Subjective: Patient seen and examined the bedside this morning.  Remains comfortable.  Does not complain of any nausea, vomiting. Continues to ambulate.  His abdominal pain is significantly better today.  Had a bowel movement.  Objective: Vitals:   01/23/17 0533 01/23/17 1243 01/23/17 2146 01/24/17 0607  BP: 130/74 108/70 108/87 121/70  Pulse: 91 92 77 84  Resp: 16 16 17 18   Temp: 98.5 F (36.9 C) 97.7 F (36.5 C) 97.9 F (36.6 C) 98 F (36.7 C)  TempSrc: Oral Oral Oral Oral  SpO2: 96% 97% 98% 97%  Weight:      Height:        Intake/Output Summary (Last 24 hours) at 01/24/2017 1234 Last data filed at 01/24/2017 0400 Gross per 24 hour  Intake 2001 ml  Output 550 ml  Net 1451 ml   Filed Weights   01/17/17 2326 01/20/17 0428 01/21/17 0610  Weight: 66.4 kg (146 lb 6.2 oz) 66.6 kg (146 lb 13.2 oz) 67.9 kg (149 lb 11.2 oz)    Examination:  General exam: Appears calm and comfortable ,Not in distress,average built Respiratory system: Bilateral equal air entry, normal vesicular breath sounds, no wheezes or crackles  Cardiovascular system: S1 & S2 heard, RRR. No JVD, murmurs, rubs, gallops or clicks. No pedal edema. Gastrointestinal system: Abdomen is nondistended, soft and nontender. No organomegaly or masses felt. Normal bowel sounds heard. Previous G-tube site draining yellow mucus/pus like material Central nervous system: Alert and oriented. No focal neurological deficits. Extremities: No edema,  no clubbing ,no cyanosis, distal peripheral pulses palpable. Skin: No cyanosis,No pallor,No Rash,No Ulcer Psychiatry: Judgement and insight appear normal. Mood & affect appropriate.      Data Reviewed: I have personally reviewed following labs and  imaging studies  CBC: Recent Labs  Lab 01/19/17 0700 01/20/17 0440 01/21/17 0615 01/22/17 0609 01/23/17 0530  WBC 2.3* 2.6* 3.3* 3.2* 4.0  NEUTROABS 1.4* 1.6* 2.1 1.9 2.5  HGB 8.2* 8.5* 8.6* 8.1* 8.2*  HCT 24.6* 25.8* 25.3* 24.4* 23.9*  MCV 96.9 96.3 95.1 95.3 93.7  PLT 135* 141* 137* 132* 824*   Basic Metabolic Panel: Recent Labs  Lab 01/20/17 0440 01/21/17 0615 01/22/17 0609 01/23/17 0530 01/24/17 0500  NA 135 133* 136 134* 133*  K 3.9 3.8 3.5 3.4* 3.7  CL 105 102 105 101 100*  CO2 25 23 24 26 26   GLUCOSE 119* 142* 116* 235* 267*  BUN 11 12 12 11 10   CREATININE 0.61 0.65 0.60* 0.56* 0.60*  CALCIUM 8.5* 8.3* 8.4* 8.2* 8.4*  MG 1.7 1.4* 1.6* 1.5* 2.0  PHOS  --   --  2.3* 2.2* 2.6   GFR: Estimated Creatinine Clearance: 74.3 mL/min (A) (by C-G formula based on SCr of 0.6 mg/dL (L)). Liver Function Tests: Recent Labs  Lab 01/17/17 2145 01/18/17 0525 01/19/17 0700 01/23/17 0530 01/24/17 0500  AST 17 12* 12* 14* 15  ALT 10* 9* 8* 8* 7*  ALKPHOS 62 52 52 47 54  BILITOT 1.2 0.6 0.5 0.4 0.4  PROT 6.5 5.3* 5.3* 4.9* 5.4*  ALBUMIN 3.2* 2.7* 2.5* 2.1* 2.3*   No results for input(s): LIPASE, AMYLASE in the last 168 hours. No results for input(s): AMMONIA in the last 168 hours. Coagulation Profile: No results for input(s): INR, PROTIME in the last 168 hours. Cardiac Enzymes: No results for input(s): CKTOTAL, CKMB, CKMBINDEX, TROPONINI in the last 168 hours. BNP (last 3 results) No results for input(s): PROBNP in the last 8760 hours. HbA1C: No results for input(s): HGBA1C in the last 72 hours. CBG: Recent Labs  Lab 01/23/17 1200 01/23/17 1824 01/24/17 0002 01/24/17 0606 01/24/17 1204  GLUCAP 226* 298* 335* 266* 251*   Lipid Profile: Recent Labs    01/23/17 0530  TRIG 91   Thyroid Function Tests: No results for input(s): TSH, T4TOTAL, FREET4, T3FREE, THYROIDAB in the last 72 hours. Anemia Panel: No results for input(s): VITAMINB12, FOLATE, FERRITIN,  TIBC, IRON, RETICCTPCT in the last 72 hours. Sepsis Labs: No results for input(s): PROCALCITON, LATICACIDVEN in the last 168 hours.  Recent Results (from the past 240 hour(s))  Culture, blood (routine x 2)     Status: None   Collection Time: 01/17/17  9:40 PM  Result Value Ref Range Status   Specimen Description BLOOD PORTA CATH RIGHT CHEST  Final   Special Requests   Final    BOTTLES DRAWN AEROBIC AND ANAEROBIC Blood Culture adequate volume   Culture   Final    NO GROWTH 5 DAYS Performed at [facility], [address] [place],  [address]    Report Status 01/23/2017 FINAL  Final  Culture, blood (routine x 2)     Status: None   Collection Time: 01/17/17  9:45 PM  Result Value Ref Range Status   Specimen Description BLOOD LEFT ANTECUBITAL  Final   Special Requests   Final    BOTTLES DRAWN AEROBIC AND ANAEROBIC Blood Culture adequate volume   Culture   Final    NO GROWTH 5 DAYS Performed at  [facility], [place] [address] [place], [place] [address]    Report Status 01/22/2017 FINAL  Final         Radiology Studies: No results found.      Scheduled Meds: . clopidogrel  75 mg Oral Daily  . dexamethasone  2 mg Intravenous Q24H  . feeding supplement (ENSURE ENLIVE)  237 mL Oral TID BM  . insulin aspart  0-20 Units Subcutaneous Q4H  . insulin glargine  10 Units Subcutaneous QHS  . pantoprazole (PROTONIX) IV  40 mg Intravenous Q24H  . Zinc Oxide   Topical Q4H   Continuous Infusions: . sodium chloride 35 mL/hr at 01/23/17 0653  . cefTRIAXone (ROCEPHIN)  IV Stopped (01/23/17 1752)  . famotidine (PEPCID) IV Stopped (01/23/17 2154)  . Marland KitchenTPN (CLINIMIX-E) Adult     And  . fat emulsion    . Marland KitchenTPN (CLINIMIX-E) Adult 40 mL/hr at 01/23/17 1709     LOS: 7 days    Time spent: 25 mins    Leonora Gores Jodie Echevaria, MD [facility] Pager [phone]  If 7PM-7AM, please contact night-coverage [email or web address] Password [facility] 01/24/2017, 12:34 PM

## 2017-01-24 NOTE — Progress Notes (Signed)
Nutrition Follow-up  DOCUMENTATION CODES:   Severe malnutrition in context of chronic illness  INTERVENTION:   Monitor magnesium, potassium, and phosphorus daily for at least 3 days, MD to replete as needed, as pt is at risk for refeeding syndrome given severe malnutrition and lack of nutrition.  TPN per Pharmacy   NUTRITION DIAGNOSIS:   Severe Malnutrition related to chronic illness, cancer and cancer related treatments as evidenced by percent weight loss, moderate fat depletion, severe fat depletion, severe muscle depletion.  Ongoing.  GOAL:   Patient will meet greater than or equal to 90% of their needs  MONITOR:   Weight trends, Labs, Skin(TPN)  ASSESSMENT:   Pt with PMH significant for buccal mucosa squamous cell carcinoma (chemo/radiation), dysphagia, s/p PEG placement May 2018, CAD, DM, HLD, and HTN. Presents this admission with PEG tube malfunction. Surgery notes pt had PEG tube upsize from 3F to 17F 1/15, but it continues to leak around the site.   Patient reporting feeling better. Less leakage and pain from tube site. Currently NPO. TPN continues to infuse (day 2) , currently Clinimix E 5/20 @ 40 ml/hr w/ 20% ILE At 20 ml/hr x 12 hours (~1324 kcal and 48g protein). Pt still with no BM since admission.  Medications: IV Decadron every 24 hours, IV Rocephin Labs reviewed: CBGs: 266-335 Low Na Mg/Phos WNL  Diet Order:  TPN (CLINIMIX-E) Adult TPN (CLINIMIX-E) Adult  EDUCATION NEEDS:   Education needs have been addressed  Skin:  Skin Assessment: Skin Integrity Issues: Skin Integrity Issues:: Other (Comment) Other: non-pressure abdominal wound  Last BM:  PTA  Height:   Ht Readings from Last 1 Encounters:  01/17/17 5\' 8"  (1.727 m)    Weight:   Wt Readings from Last 1 Encounters:  01/21/17 149 lb 11.2 oz (67.9 kg)    Ideal Body Weight:  70 kg  BMI:  Body mass index is 22.76 kg/m.  Estimated Nutritional Needs:   Kcal:  1275-1700  kcal/day  Protein:  110-120 g/day  Fluid:  >2.3 L/day  Clayton Bibles, MS, RD, LDN [place] [facility] Pager: [phone] After Hours Pager: [phone]

## 2017-01-24 NOTE — Consult Note (Signed)
[place] Nurse wound follow up Wound type: previous gastrostomy site Measurement: not measured today Wound bed: pink Drainage (amount, consistency, odor) moderate, thicker, yellow exudate Periwound: improved, still pink just distal to opening from 3-9 o'clock Dressing procedure/placement/frequency: I changed the dressing this morning and the site is looking better. The drainage has changed drastically since started on Protonix. The drainage is thick, cream/yellow colored and does not burn skin like it did. Patient in much less pain. Ambulating in hallway and asking to take a shower. MD on unit and does not want him getting area wet yet. I will decrease dressing changes to Q4H and recheck tonight. Patient much more alert, active and optimistic today. We will follow this patient and remain available to this patient, nursing, and the medical and surgical teams.  Fara Olden, RN-C, WTA-C, [place] [facility] Treatment Associate [facility] Care Associate

## 2017-01-24 NOTE — Progress Notes (Signed)
Andres Smith   DOB:[birth year]   UV#:[id number]    Subjective: The patient feels better.  He was able to walk around the hallway and was able to sit on the chair.  He has less leakage and less sensation of gastritis.  Overall, he felt stronger.  He has no bowel movement for 1 week  Objective:  Vitals:   01/23/17 2146 01/24/17 0607  BP: 108/87 121/70  Pulse: 77 84  Resp: 17 18  Temp: 97.9 F (36.6 C) 98 F (36.7 C)  SpO2: 98% 97%     Intake/Output Summary (Last 24 hours) at 01/24/2017 4742 Last data filed at 01/24/2017 0400 Gross per 24 hour  Intake 2401 ml  Output 550 ml  Net 1851 ml    GENERAL:alert, no distress and comfortable SKIN: skin color, texture, turgor are normal, no rashes or significant lesions Musculoskeletal:no cyanosis of digits and no clubbing  NEURO: alert & oriented x 3 with fluent speech, no focal motor/sensory deficits   Labs:  Lab Results  Component Value Date   WBC 4.0 01/23/2017   HGB 8.2 (L) 01/23/2017   HCT 23.9 (L) 01/23/2017   MCV 93.7 01/23/2017   PLT 138 (L) 01/23/2017   NEUTROABS 2.5 01/23/2017    Lab Results  Component Value Date   NA 133 (L) 01/24/2017   K 3.7 01/24/2017   CL 100 (L) 01/24/2017   CO2 26 01/24/2017    Assessment & Plan:   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 We will continue supportive care  Acquired pancytopenia [facility]) He has acquired pancytopenia due to chemotherapy He is not symptomatic Observe only. He does not need G-CSF support orblood transfusion  Cancer associated pain The patient has intermittent rib pain Low-dose tramadol appears to control his pain well We will give him intermittent IV morphine as needed  Dysphagia, leaking around feeding tube Dysphagia is stable He is able to swallow food but  not motivated eat enoughand maintain his caloric intake. Whenever he tries to eat, thereis leakage of nutritional supplement near the feeding tube site which is not unusual.Typically, I would expect the feeding tube site to close within 2-3 days. However, due to his severe malnutrition and chronic prednisone use, that healing is taking longer time than expected. Unfortunately, he is not making any progress with oral intake due to the leak. He was started on TPN on January 22, 2017 I will change his intake to n.p.o. status but allowing sips of water only to prevent further leakage around his feeding tube.  Slow wound healing around feeding tube site & GERD Appreciate care from wound care nurse I think that is a component of reflux that exacerbate leakage of gastric content He was started on proton pump inhibitor in the morning and Pepcid at night since 1/23 with symptomatic improvement  Severe protein calorie malnutrition He is on TPN We will watch out for refeeding syndrome He will continue electrolyte replacement therapy as needed  Chronic history of Crohn's disease He is on low-dose prednisone in the outpatient clinic I will switch him to low-dose IV dexamethasone in the short-term He is currently constipated.  Will initiate laxative therapy once we are allowing him oral intake down the road  Profound weakness Encourage PT  Goals of care I have some discussion with his wife. She is afraid that  her husband is not going to make it and he is actively dying from malnutrition. I recommend short-term trial of TPN for 7 days. If he has no meaningful improvement beyond 7 days, we will have further family discussion about stopping artificial feeding.  Discharge planning He is not ready to be discharged  [name], MD 01/24/2017  8:21 AM

## 2017-01-24 NOTE — Progress Notes (Signed)
[place] NOTE   Pharmacy Consult for short-term trial of TPN for 7 days. Indication: unable to tolerate PO intake  Patient Measurements: Body mass index is 22.76 kg/m. Filed Weights   01/17/17 2326 01/20/17 0428 01/21/17 0610  Weight: 146 lb 6.2 oz (66.4 kg) 146 lb 13.2 oz (66.6 kg) 149 lb 11.2 oz (67.9 kg)  Ideal Body Weight:  70 kg  HPI:  35 yoM admitted on 1/17 with c/o G-tube leaking blood and gastric contents around exit site causing erosion of surrounding skin.  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  IR converted to larger G-J tube on 1/15, but it has continued to leak.  On 1/19 the [place] tube was permanently removed without complications.  He continues to have leakage through the non-healing tube site and is therefore unable to meet nutrition goals with oral diet. Pharmacy is consulted for trial of short-term TPN to provide nutrition and avoid oral intake that is exacerbating the healing process around his feeding tube site.  Significant events:   Insulin requirements past 24 hours: 20 units sensitive SSI (no hx DM, increased from use of 5 units the day prior)  Current Nutrition: NPO  Ensure supplements ordered  IVF: NS at 35 ml/hr  Central access: Implanted port (05/21/16) TPN start date: 01/22/17  ASSESSMENT                                                                                                          Today:   Glucose:  goal range < 150.  Elevated CBGs range 190-335.  Currently only 1/2 rate TPN  Prednisone 10mg  daily from home and 1/22-1/23, changed to Dexamethasone IV 2mg /day on 1/23.  Likely contributing to hyperglycemia  Electrolytes:  Na low (unable to adjust in premix TPN).  Phos, Mag, and K improved to WNL after aggressive supplementation.  Monitor for refeeding syndrome.  Renal:  SCr 0.6  LFTs:   Low/stable  TGs:  91 (1/23)  Prealbumin:  <5 (1/22)   NUTRITIONAL GOALS                                                                                             RD recs (1/18):  110-120 Protein, 2350-2550 Kcal  Clinimix 5/20 at a goal rate of 83 ml/hr + 20% fat emulsion 240 ml/day to provide: 100 g of protein and 2233 kCals per day meeting 91 % of protein and 95 % of kCal needs  - Glucose infusion rate will be 4.07 mg/kg/min (Maximum 5 mg/kg/min)   PLAN  Now:  At 1800 today:  Continue Clinimix E 5/20 at 40 ml/hr - do NOT advance rate until CBGs normalize  TPN to contain standard multivitamins and trace element daily.  Add regular insulin to TPN - patient to receive 10 units / 24 hrs.  20% fat emulsion at 20 ml/hr x12 hours/day  Continue IVF at 35 ml/hr.  Increase to CBGs and resistant SSI q4h.   TPN lab panels on Mondays & Thursdays.  Monitor for re-feeding syndrome: check BMET, Mag, Phos daily x3 days  Gretta Arab PharmD, BCPS Pager [phone] 01/24/2017 8:38 AM

## 2017-01-24 NOTE — Care Management Note (Signed)
Case Management Note  Patient Details  Name: Andres Smith MRN: [id number] Date of Birth: [birth year]  Subjective/Objective:      77 yo admitted with Peg tube malfunction. Peg tube removed and pt on a 7 day trial of TPN. TPN started 1/22              Action/Plan: Pt was active with [facility] for HHPT/OT/RN prior to admission. At this time, unsure of disposition plans. Will continue to follow and assist as needed. If pt returns with home health services, will need resumption orders from MD. Pt was approved for [place] (High Risk for Readmission Initiative).   Expected Discharge Date:                  Expected Discharge Plan:  [place]   In-House Referral:     Discharge planning Services  CM Consult  Post Acute Care Choice:    Choice offered to:     DME Arranged:    DME Agency:     HH Arranged:  PT, OT, RN [place] Agency:  [place]  Status of Service:  In process, will continue to follow  If discussed at Long Length of Stay Meetings, dates discussed:    Additional CommentsLynnell Catalan, RN 01/24/2017, 11:16 AM  [phone]

## 2017-01-24 NOTE — Consult Note (Signed)
   [facility] CM Inpatient Consult   01/24/2017  YOEL KAUFHOLD 13-Jul-1940 [id number]    Patient screened for potential [facility] Care Management services.   Chart reviewed. Spoke with inpatient RNCM. Patient currently on TPN for 7 days. Awaiting to see if he improves. Discussed that writer will follow along at the peripheral and engage for [place] Management if and when appropriate.    Marthenia Rolling, MSN-Ed, RN,BSN [facility] Liaison [phone]

## 2017-01-25 LAB — PHOSPHORUS: PHOSPHORUS: 3 mg/dL (ref 2.5–4.6)

## 2017-01-25 LAB — CBC WITH DIFFERENTIAL/PLATELET
BASOS ABS: 0 10*3/uL (ref 0.0–0.1)
Basophils Relative: 0 %
EOS PCT: 0 %
Eosinophils Absolute: 0 10*3/uL (ref 0.0–0.7)
HCT: 23.7 % — ABNORMAL LOW (ref 39.0–52.0)
Hemoglobin: 8 g/dL — ABNORMAL LOW (ref 13.0–17.0)
Lymphocytes Relative: 15 %
Lymphs Abs: 0.8 10*3/uL (ref 0.7–4.0)
MCH: 31.6 pg (ref 26.0–34.0)
MCHC: 33.8 g/dL (ref 30.0–36.0)
MCV: 93.7 fL (ref 78.0–100.0)
MONO ABS: 0.7 10*3/uL (ref 0.1–1.0)
Monocytes Relative: 13 %
Neutro Abs: 3.5 10*3/uL (ref 1.7–7.7)
Neutrophils Relative %: 72 %
PLATELETS: 150 10*3/uL (ref 150–400)
RBC: 2.53 MIL/uL — ABNORMAL LOW (ref 4.22–5.81)
RDW: 15.9 % — AB (ref 11.5–15.5)
WBC: 4.9 10*3/uL (ref 4.0–10.5)

## 2017-01-25 LAB — BASIC METABOLIC PANEL
Anion gap: 10 (ref 5–15)
BUN: 12 mg/dL (ref 6–20)
CALCIUM: 9.3 mg/dL (ref 8.9–10.3)
CO2: 29 mmol/L (ref 22–32)
Chloride: 102 mmol/L (ref 101–111)
Creatinine, Ser: 0.6 mg/dL — ABNORMAL LOW (ref 0.61–1.24)
GFR calc Af Amer: >60 mL/min (ref >60)
GLUCOSE: 122 mg/dL — AB (ref 65–99)
POTASSIUM: 4.5 mmol/L (ref 3.5–5.1)
Sodium: 141 mmol/L (ref 135–145)

## 2017-01-25 LAB — GLUCOSE, CAPILLARY
GLUCOSE-CAPILLARY: 173 mg/dL — AB (ref 65–99)
GLUCOSE-CAPILLARY: 135 mg/dL — AB (ref 65–99)
Glucose-Capillary: 125 mg/dL — ABNORMAL HIGH (ref 65–99)
Glucose-Capillary: 222 mg/dL — ABNORMAL HIGH (ref 65–99)
Glucose-Capillary: 239 mg/dL — ABNORMAL HIGH (ref 65–99)
Glucose-Capillary: 269 mg/dL — ABNORMAL HIGH (ref 65–99)

## 2017-01-25 LAB — MAGNESIUM: MAGNESIUM: 1.7 mg/dL (ref 1.7–2.4)

## 2017-01-25 MED ORDER — CEPHALEXIN 500 MG PO CAPS
500.0000 mg | ORAL_CAPSULE | Freq: Four times a day (QID) | ORAL | Status: DC
  Administered 2017-01-25: 500 mg via ORAL
  Filled 2017-01-25 (×2): qty 1

## 2017-01-25 MED ORDER — SODIUM CHLORIDE 0.9 % IV SOLN: INTRAVENOUS | Status: AC

## 2017-01-25 MED ORDER — FAT EMULSION 20 % IV EMUL
240.0000 mL | INTRAVENOUS | Status: AC
Start: 2017-01-25 — End: 2017-01-26
  Administered 2017-01-25: 240 mL via INTRAVENOUS
  Filled 2017-01-25: qty 250

## 2017-01-25 MED ORDER — MAGNESIUM SULFATE IN D5W 1-5 GM/100ML-% IV SOLN
1.0000 g | Freq: Once | INTRAVENOUS | Status: AC
  Administered 2017-01-25: 1 g via INTRAVENOUS
  Filled 2017-01-25: qty 100

## 2017-01-25 MED ORDER — TRACE MINERALS CR-CU-MN-SE-ZN 10-1000-500-60 MCG/ML IV SOLN
INTRAVENOUS | Status: AC
  Administered 2017-01-25: 18:00:00 via INTRAVENOUS
  Filled 2017-01-25: qty 1200

## 2017-01-25 NOTE — Progress Notes (Signed)
Andres Smith   DOB:21-Oct-1940   IR#:[id number]    Subjective: He feels better today. Has not have much pain/burning for last 24 hours. Still have discharge around his wound. Had a BM yesterday. Was able to walk around  Objective:  Vitals:   01/24/17 2055 01/25/17 0510  BP: 127/73 135/73  Pulse: 74 80  Resp: 20 20  Temp: 98 F (36.7 C) 97.8 F (36.6 C)  SpO2: 99% 98%     Intake/Output Summary (Last 24 hours) at 01/25/2017 5009 Last data filed at 01/25/2017 3818 Gross per 24 hour  Intake 1804.08 ml  Output 1125 ml  Net 679.08 ml    GENERAL:alert, no distress and comfortable Musculoskeletal:no cyanosis of digits and no clubbing  NEURO: alert & oriented x 3 with fluent speech, no focal motor/sensory deficits   Labs:  Lab Results  Component Value Date   WBC 4.9 01/25/2017   HGB 8.0 (L) 01/25/2017   HCT 23.7 (L) 01/25/2017   MCV 93.7 01/25/2017   PLT 150 01/25/2017   NEUTROABS 3.5 01/25/2017    Lab Results  Component Value Date   NA 133 (L) 01/24/2017   K 3.7 01/24/2017   CL 100 (L) 01/24/2017   CO2 26 01/24/2017    Assessment & Plan:   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 We will continue supportive care  Acquired pancytopenia [facility]) He has acquired pancytopenia due to chemotherapy He is not symptomatic Observe only. He does not need G-CSF support orblood transfusion  Cancer associated pain The patient has intermittent rib pain We will give him intermittent IV morphine as needed  Dysphagia, leaking around feeding tube Dysphagia is stable He was started on TPN on January 22, 2017 I recommend continue ice chips only until next Monday. By then the leak should be resolved and we can give him trial of clear liquids  Slow wound healing around feeding tube site &  GERD Appreciate care from wound care nurse I think that is a component of reflux that exacerbate leakage of gastric content He was started on proton pump inhibitor in the morning and Pepcid at night since 1/23 with symptomatic improvement  Severe protein calorie malnutrition He is on TPN We will watch out for refeeding syndrome He will continue electrolyte replacement therapy as needed  Chronic history of Crohn's disease He is on low-dose prednisone in the outpatient clinic I will switch him to low-dose IV dexamethasone in the short-term  Profound weakness Encourage PT  Goals of care I recommend short-term trial of TPN for 7 days. Overall he is improving Plan to advance oral diet next week and wean off TPN  Discharge planning He is not ready to be discharged, maybe next week I will be away this weekend, will return on Monday Call if questions arise  [name], MD 01/25/2017  8:06 AM

## 2017-01-25 NOTE — Progress Notes (Signed)
Andres Smith NOTE   Pharmacy Consult for short-term trial of TPN for 7 days. Indication: unable to tolerate PO intake  Patient Measurements: Body mass index is 22.76 kg/m. Filed Weights   01/17/17 2326 01/20/17 0428 01/21/17 0610  Weight: 146 lb 6.2 oz (66.4 kg) 146 lb 13.2 oz (66.6 kg) 149 lb 11.2 oz (67.9 kg)  Ideal Body Weight:  70 kg  HPI:  29 yoM admitted on 1/17 with c/o G-tube leaking blood and gastric contents around exit site causing erosion of surrounding skin.  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  IR converted to larger G-J tube on 1/15, but it has continued to leak.  On 1/19 the [place] tube was permanently removed without complications.  He continues to have leakage through the non-healing tube site and is therefore unable to meet nutrition goals with oral diet. Pharmacy is consulted for trial of short-term TPN to provide nutrition and avoid oral intake that is exacerbating the healing process around his feeding tube site.  Significant events:   Insulin requirements past 24 hours: 36 units sensitive SSI ,pt on dexamethasone, no hx DM  Current Nutrition: NPO   IVF: NS at 35 ml/hr  Central access: Implanted port (05/21/16) TPN start date: 01/22/17  ASSESSMENT                                                                                                          Today:   Glucose:  goal range < 150.  Elevated CBGs range 173-335.  Currently only 1/2 rate TPN  Prednisone 10mg  daily from home and 1/22-1/23, changed to Dexamethasone IV 2mg /day on 1/23.  Likely contributing to hyperglycemia  Electrolytes:  All WNL after aggressive supplementation. Mag low end of normal. Monitor for refeeding syndrome.  Renal:  SCr 0.6  LFTs:  Low/stable  TGs:  91 (1/23)  Prealbumin:  <5 (1/22)   NUTRITIONAL GOALS                                                                                              RD recs (1/18):  110-120 Protein, 2350-2550 Kcal  Clinimix 5/20 at a goal rate of 83 ml/hr + 20% fat emulsion 240 ml/day to provide: 100 g of protein and 2233 kCals per day meeting 91 % of protein and 95 % of kCal needs  - Glucose infusion rate will be 4.07 mg/kg/min (Maximum 5 mg/kg/min)   PLAN  Now: Magnesium Sulfate 1gm IV x 1 At 1800 today:  increase Clinimix E 5/20 to 50 ml/hr   TPN to contain standard multivitamins and trace element daily.  increase regular insulin in TPN - patient to receive 25 units / 24 hrs.  20% fat emulsion at 20 ml/hr x12 hours/day  decrease IVF to 25 ml/hr.  continue CBGs and resistant SSI q4h.   TPN lab panels on Mondays & Thursdays.  Monitor for re-feeding syndrome: check BMET, Mag, Phos tomorrow  Andres Smith RPh 01/25/2017, 9:37 AM Pager [phone]

## 2017-01-25 NOTE — Consult Note (Addendum)
[place] Nurse wound follow up Wound type:irritant contact dermatitis Measurement:3.5cm x 5.4cm x 0.2cm located from 5-7 o'clock below tube insertion site (full thickness). Smaller full thickness 2cm x 2cm x 0.2cm area located at 11 o'clock of tube insertion site. Wound JME:QAST, moist with basement cell membrane evident.  Small islands of reepithelialization. Drainage (amount, consistency, odor) serous to light yellow in a small amount Periwound: coated with triple paste, but intact and improving Dressing procedure/placement/frequency: Dressing changes are currently every 4 hours.  Will continue with that frequency over weekend. Wife and daughter in to see during my assessment.  Bedside RN Josph Macho performed wound care irrigating site with NS, then filling defect with silver hydrofiber, then applying triple paset around the defect created by the tube.  [place] nursing team will not follow, but will remain available to this patient, the nursing and medical teams.  Please re-consult if needed. Thanks, Maudie Flakes, MSN, RN, [place], [facility]  Pager# [phone]

## 2017-01-25 NOTE — Progress Notes (Signed)
PROGRESS NOTE    Andres Smith  EGB:[id number] DOB: 1940/10/18 DOA: 01/17/2017 PCP: Janith Lima, MD   Brief Narrative: Patient is a 77 year old male with past medical history of buccal mucosa squamous cell cancer, dysphagia with G-tube in place, atrial fibrillation, coronary artery disease status post CABG, recent CVA was admitted for the evaluation of malfunction of his G-tube and possible abdominal cellulitis.  Assessment & Plan:   Active Problems:   PEG tube malfunction (HCC)   Protein-calorie malnutrition, severe  G-tube malfunction with abdominal cellulitis: G-tube removed by IR. Vancomycin switched to Rocephin for cellulitis.  Cultures were negative. Continue wound care around the area of the cellulitis Had  a bowel movement yesterday Currently NPO. Ice chips ok Wound care following.  Continue PPI Hoping for the healing of the leak and starting on clear liquid diet soon  Moderate to severe protein calorie malnutrition/failure to thrive: Patient was afraid to eat because of pain/leakage of the gastric contents from the previous feeding tube site. Started on TPN on 01/22/17.  Pharmacy on board.  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 Alvy Bimler .  Not a candidate for chemotherapy now. Currently he can swallow food but afraid to eat due to abdominal pain and gastric leakage.  History of recent CVA: On Plavix.  Continue statin  Coronary artery disease: Status post CABG.  Stable  Hypertension: Currently blood pressure stable.  We will continue to monitor.  Crohn's disease: Steroids ordered by oncology.  Continue supportive care.  Hypokalemia: Supplemented and corrected.  We will continue to monitor the levels.  Pancytopneia: Secondary to chemotherapy.  We will continue to monitor the counts.  Generalized weakness: Physical therapy ordered.  Patient is ambulating.  Hyperglycemia: Continue sliding scale insulin.  Start on low-dose  Lantus      DVT prophylaxis: ScD Code Status: Partial code.  No intubation.  CPR okay Family Communication: No family member  present at the bedside Disposition Plan: Home once clinically improves   Consultants: IR, oncology  Procedures: None  Antimicrobials: Ceftriaxone since 01/20/17 Vancomycin 01/17/17-01/20/17  Subjective: Patient seen and examined the bedside this morning.  Was sleeping.  Denies any abdominal pain .  No new issues from yesterday  Objective: Vitals:   01/24/17 0607 01/24/17 1909 01/24/17 2055 01/25/17 0510  BP: 121/70 122/74 127/73 135/73  Pulse: 84 80 74 80  Resp: 18 18 20 20   Temp: 98 F (36.7 C) 98.2 F (36.8 C) 98 F (36.7 C) 97.8 F (36.6 C)  TempSrc: Oral Oral Oral Oral  SpO2: 97% 98% 99% 98%  Weight:      Height:        Intake/Output Summary (Last 24 hours) at 01/25/2017 1403 Last data filed at 01/25/2017 1200 Gross per 24 hour  Intake 1804.08 ml  Output 1750 ml  Net 54.08 ml   Filed Weights   01/17/17 2326 01/20/17 0428 01/21/17 0610  Weight: 66.4 kg (146 lb 6.2 oz) 66.6 kg (146 lb 13.2 oz) 67.9 kg (149 lb 11.2 oz)    Examination:  General exam: Appears calm and comfortable ,Not in distress,average built Respiratory system: Bilateral equal air entry, normal vesicular breath sounds, no wheezes or crackles  Cardiovascular system: S1 & S2 heard, RRR. No JVD, murmurs, rubs, gallops or clicks. No pedal edema. Gastrointestinal system: Abdomen is nondistended, soft and nontender. No organomegaly or masses felt. Normal bowel sounds heard. Previous G-tube site draining yellow mucus/pus like material Central nervous system: Alert and oriented. No  focal neurological deficits. Extremities: No edema, no clubbing ,no cyanosis, distal peripheral pulses palpable. Skin: No cyanosis,No pallor,No Rash,No Ulcer Psychiatry: Judgement and insight appear normal. Mood & affect appropriate.      Data Reviewed: I have personally reviewed following labs  and imaging studies  CBC: Recent Labs  Lab 01/20/17 0440 01/21/17 0615 01/22/17 0609 01/23/17 0530 01/25/17 0735  WBC 2.6* 3.3* 3.2* 4.0 4.9  NEUTROABS 1.6* 2.1 1.9 2.5 3.5  HGB 8.5* 8.6* 8.1* 8.2* 8.0*  HCT 25.8* 25.3* 24.4* 23.9* 23.7*  MCV 96.3 95.1 95.3 93.7 93.7  PLT 141* 137* 132* 138* 956   Basic Metabolic Panel: Recent Labs  Lab 01/21/17 0615 01/22/17 0609 01/23/17 0530 01/24/17 0500 01/25/17 0735  NA 133* 136 134* 133* 141  K 3.8 3.5 3.4* 3.7 4.5  CL 102 105 101 100* 102  CO2 23 24 26 26 29   GLUCOSE 142* 116* 235* 267* 122*  BUN 12 12 11 10 12   CREATININE 0.65 0.60* 0.56* 0.60* 0.60*  CALCIUM 8.3* 8.4* 8.2* 8.4* 9.3  MG 1.4* 1.6* 1.5* 2.0 1.7  PHOS  --  2.3* 2.2* 2.6 3.0   GFR: Estimated Creatinine Clearance: 74.3 mL/min (A) (by C-G formula based on SCr of 0.6 mg/dL (L)). Liver Function Tests: Recent Labs  Lab 01/19/17 0700 01/23/17 0530 01/24/17 0500  AST 12* 14* 15  ALT 8* 8* 7*  ALKPHOS 52 47 54  BILITOT 0.5 0.4 0.4  PROT 5.3* 4.9* 5.4*  ALBUMIN 2.5* 2.1* 2.3*   No results for input(s): LIPASE, AMYLASE in the last 168 hours. No results for input(s): AMMONIA in the last 168 hours. Coagulation Profile: No results for input(s): INR, PROTIME in the last 168 hours. Cardiac Enzymes: No results for input(s): CKTOTAL, CKMB, CKMBINDEX, TROPONINI in the last 168 hours. BNP (last 3 results) No results for input(s): PROBNP in the last 8760 hours. HbA1C: No results for input(s): HGBA1C in the last 72 hours. CBG: Recent Labs  Lab 01/24/17 2052 01/25/17 0029 01/25/17 0456 01/25/17 0801 01/25/17 1216  GLUCAP 219* 222* 173* 125* 135*   Lipid Profile: Recent Labs    01/23/17 0530  TRIG 91   Thyroid Function Tests: No results for input(s): TSH, T4TOTAL, FREET4, T3FREE, THYROIDAB in the last 72 hours. Anemia Panel: No results for input(s): VITAMINB12, FOLATE, FERRITIN, TIBC, IRON, RETICCTPCT in the last 72 hours. Sepsis Labs: No results for  input(s): PROCALCITON, LATICACIDVEN in the last 168 hours.  Recent Results (from the past 240 hour(s))  Culture, blood (routine x 2)     Status: None   Collection Time: 01/17/17  9:40 PM  Result Value Ref Range Status   Specimen Description BLOOD PORTA CATH RIGHT CHEST  Final   Special Requests   Final    BOTTLES DRAWN AEROBIC AND ANAEROBIC Blood Culture adequate volume   Culture   Final    NO GROWTH 5 DAYS Performed at [facility], [address], [place], [place] [address]    Report Status 01/23/2017 FINAL  Final  Culture, blood (routine x 2)     Status: None   Collection Time: 01/17/17  9:45 PM  Result Value Ref Range Status   Specimen Description BLOOD LEFT ANTECUBITAL  Final   Special Requests   Final    BOTTLES DRAWN AEROBIC AND ANAEROBIC Blood Culture adequate volume   Culture   Final    NO GROWTH 5 DAYS Performed at [facility], [place] [address], [place], [place] [address]  Report Status 01/22/2017 FINAL  Final         Radiology Studies: No results found.      Scheduled Meds: . clopidogrel  75 mg Oral Daily  . dexamethasone  2 mg Intravenous Q24H  . insulin aspart  0-20 Units Subcutaneous Q4H  . pantoprazole (PROTONIX) IV  40 mg Intravenous Q24H  . Zinc Oxide   Topical Q4H   Continuous Infusions: . sodium chloride 35 mL/hr at 01/23/17 0653  . sodium chloride    . cefTRIAXone (ROCEPHIN)  IV Stopped (01/24/17 1753)  . famotidine (PEPCID) IV Stopped (01/24/17 2135)  . Marland KitchenTPN (CLINIMIX-E) Adult     And  . fat emulsion    . magnesium sulfate 1 - 4 g bolus IVPB    . Marland KitchenTPN (CLINIMIX-E) Adult 40 mL/hr at 01/24/17 1704     LOS: 8 days    Time spent: 25 mins    Barnabas Henriques Jodie Echevaria, MD [facility] Pager [phone]  If 7PM-7AM, please contact night-coverage [email or web address] Password [facility] 01/25/2017, 2:03 PM

## 2017-01-26 LAB — BASIC METABOLIC PANEL
Anion gap: 4 — ABNORMAL LOW (ref 5–15)
BUN: 15 mg/dL (ref 6–20)
CHLORIDE: 98 mmol/L — AB (ref 101–111)
CO2: 31 mmol/L (ref 22–32)
Calcium: 8.5 mg/dL — ABNORMAL LOW (ref 8.9–10.3)
Creatinine, Ser: 0.7 mg/dL (ref 0.61–1.24)
GFR calc Af Amer: >60 mL/min (ref >60)
GFR calc non Af Amer: >60 mL/min (ref >60)
Glucose, Bld: 127 mg/dL — ABNORMAL HIGH (ref 65–99)
POTASSIUM: 3.3 mmol/L — AB (ref 3.5–5.1)
SODIUM: 133 mmol/L — AB (ref 135–145)

## 2017-01-26 LAB — GLUCOSE, CAPILLARY
GLUCOSE-CAPILLARY: 132 mg/dL — AB (ref 65–99)
GLUCOSE-CAPILLARY: 191 mg/dL — AB (ref 65–99)
GLUCOSE-CAPILLARY: 203 mg/dL — AB (ref 65–99)
Glucose-Capillary: 147 mg/dL — ABNORMAL HIGH (ref 65–99)
Glucose-Capillary: 212 mg/dL — ABNORMAL HIGH (ref 65–99)
Glucose-Capillary: 229 mg/dL — ABNORMAL HIGH (ref 65–99)

## 2017-01-26 LAB — PHOSPHORUS: Phosphorus: 3.3 mg/dL (ref 2.5–4.6)

## 2017-01-26 LAB — MAGNESIUM: Magnesium: 1.7 mg/dL (ref 1.7–2.4)

## 2017-01-26 MED ORDER — POTASSIUM CHLORIDE 10 MEQ/100ML IV SOLN
10.0000 meq | INTRAVENOUS | Status: AC
  Administered 2017-01-26 (×5): 10 meq via INTRAVENOUS
  Filled 2017-01-26 (×5): qty 100

## 2017-01-26 MED ORDER — FAT EMULSION 20 % IV EMUL
240.0000 mL | INTRAVENOUS | Status: AC
  Administered 2017-01-26: 240 mL via INTRAVENOUS
  Filled 2017-01-26: qty 250

## 2017-01-26 MED ORDER — MAGNESIUM SULFATE IN D5W 1-5 GM/100ML-% IV SOLN
1.0000 g | Freq: Once | INTRAVENOUS | Status: AC
  Administered 2017-01-26: 1 g via INTRAVENOUS
  Filled 2017-01-26: qty 100

## 2017-01-26 MED ORDER — TRACE MINERALS CR-CU-MN-SE-ZN 10-1000-500-60 MCG/ML IV SOLN
INTRAVENOUS | Status: AC
  Administered 2017-01-26: 19:00:00 via INTRAVENOUS
  Filled 2017-01-26: qty 1560

## 2017-01-26 MED ORDER — DEXTROSE 5 % IV SOLN
1.0000 g | INTRAVENOUS | Status: AC
  Administered 2017-01-26 – 2017-01-28 (×3): 1 g via INTRAVENOUS
  Filled 2017-01-26 (×3): qty 10

## 2017-01-26 MED ORDER — SODIUM CHLORIDE 0.9 % IV SOLN
INTRAVENOUS | Status: DC
  Administered 2017-01-26 – 2017-02-11 (×7): via INTRAVENOUS

## 2017-01-26 NOTE — Progress Notes (Signed)
[facility] NOTE   Pharmacy Consult for short-term trial of TPN for 7 days. Indication: unable to tolerate PO intake  Patient Measurements: Body mass index is 22.76 kg/m. Filed Weights   01/17/17 2326 01/20/17 0428 01/21/17 0610  Weight: 146 lb 6.2 oz (66.4 kg) 146 lb 13.2 oz (66.6 kg) 149 lb 11.2 oz (67.9 kg)  Ideal Body Weight:  70 kg  HPI:  77 yoM admitted on 1/17 with c/o G-tube leaking blood and gastric contents around exit site causing erosion of surrounding skin.  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  IR converted to larger G-J tube on 1/15, but it has continued to leak.  On 1/19 the [place] tube was permanently removed without complications.  He continues to have leakage through the non-healing tube site and is therefore unable to meet nutrition goals with oral diet. Pharmacy is consulted for trial of short-term TPN to provide nutrition and avoid oral intake that is exacerbating the healing process around his feeding tube site.  Significant events:   Insulin requirements past 24 hours: 32 units   Current Nutrition: NPO   IVF: NS at 25 ml/hr  Central access: Implanted port (05/21/16) TPN start date: 01/22/17  ASSESSMENT                                                                                                          Today:   Glucose:  goal range < 150.  Elevated CBGs range 127-239  Currently only 1/2 rate TPN  Prednisone 10mg  daily from home and 1/22-1/23, changed to Dexamethasone IV 2mg /day on 1/23.  Likely contributing to hyperglycemia  Most at goal since new TPN hung.   Electrolytes:  Na, K+ LOW.  Mag, Phos at low end of normal. Monitor for refeeding syndrome.  Renal:  SCr 0.7  LFTs:  Low/stable  TGs:  91 (1/23)  Prealbumin:  <5 (1/23)   NUTRITIONAL GOALS                                                                                              RD recs (1/18):  110-120 Protein, 2350-2550 Kcal  Clinimix 5/20 at a goal rate of 83 ml/hr + 20% fat emulsion 240 ml/day to provide: 100 g of protein and 2233 kCals per day meeting 91 % of protein and 95 % of kCal needs  - Glucose infusion rate will be 4.07 mg/kg/min (Maximum 5 mg/kg/min)   PLAN  Now: Magnesium Sulfate 1gm IV x 1 K runs ordered by MD  At 1800 today:  Increase Clinimix E 5/20 to 65 ml/hr   TPN to contain standard multivitamins and trace element daily.  Regular insulin in TPN - patient to receive 25 units / 24 hrs.  20% fat emulsion at 20 ml/hr x12 hours/day  decrease IVF to 10 ml/hr.  continue CBGs and resistant SSI q4h.   TPN lab panels on Mondays & Thursdays.  Monitor for re-feeding syndrome: check BMET, Mag, Phos tomorrow  Netta Cedars, PharmD, BCPS Pager: [phone] 01/26/2017, 10:12 AM

## 2017-01-26 NOTE — Progress Notes (Signed)
PROGRESS NOTE    Andres Smith  NAT:[id number] DOB: January 20, 1940 DOA: 01/17/2017 PCP: Janith Lima, MD   Brief Narrative: Patient is a 77 year old male with past medical history of buccal mucosa squamous cell cancer, dysphagia with G-tube in place, atrial fibrillation, coronary artery disease status post CABG, recent CVA was admitted for the evaluation of malfunction of his G-tube and possible abdominal cellulitis.  Assessment & Plan:   Active Problems:   PEG tube malfunction (HCC)   Protein-calorie malnutrition, severe  G-tube malfunction with abdominal cellulitis: G-tube removed by IR. Vancomycin switched to Rocephin for cellulitis. Rocephin stopped and switched to keflex. Cultures were negative. Continue wound care around the area of the cellulitis Currently NPO. Ice chips ok Wound care following.  Continue PPI Hoping for the healing of the leak and starting on clear liquid diet soon  Moderate to severe protein calorie malnutrition/failure to thrive: Patient was afraid to eat because of pain/leakage of the gastric contents from the previous feeding tube site. Started on TPN on 01/22/17.  Pharmacy on board.  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 Alvy Bimler .  Not a candidate for chemotherapy now. Currently he can swallow food but afraid to eat due to abdominal pain and gastric leakage. Oncology following here.  History of recent CVA: On Plavix.  Continue statin  Coronary artery disease: Status post CABG.  Stable  Hypertension: Currently blood pressure stable.  We will continue to monitor.  Crohn's disease: Steroids ordered by oncology.  Continue supportive care.  Hypokalemia: Supplemented .  We will continue to monitor the levels.  Pancytopneia: Secondary to chemotherapy.  We will continue to monitor the counts.  Generalized weakness: Physical therapy ordered.  Patient is ambulating.  Hyperglycemia: Continue sliding scale insulin.     DVT prophylaxis: SCD Code Status: Partial code.  No intubation.  CPR okay Family Communication: No family member  present at the bedside Disposition Plan: Home once clinically improves   Consultants: IR, oncology  Procedures: None  Antimicrobials: Kelflex since 01/25/17  Ceftriaxone since 01/20/17-01/25/17 Vancomycin 01/17/17-01/20/17  Subjective: Patient seen and examined the bedside this morning.  He was ambulating on the hallway today.  Remains comfortable.  Drainage is from the previous G-tube site is improving.  Denies any abdominal pain  Objective: Vitals:   01/25/17 0510 01/25/17 1430 01/25/17 2128 01/26/17 0425  BP: 135/73 130/68 109/73 (!) 113/94  Pulse: 80 78 75 79  Resp: 20 20 20 19   Temp: 97.8 F (36.6 C) 98 F (36.7 C) (!) 97.5 F (36.4 C) 98.9 F (37.2 C)  TempSrc: Oral Oral Oral Oral  SpO2: 98% 98% 99% 99%  Weight:      Height:        Intake/Output Summary (Last 24 hours) at 01/26/2017 1125 Last data filed at 01/26/2017 0835 Gross per 24 hour  Intake 1164 ml  Output 925 ml  Net 239 ml   Filed Weights   01/17/17 2326 01/20/17 0428 01/21/17 0610  Weight: 66.4 kg (146 lb 6.2 oz) 66.6 kg (146 lb 13.2 oz) 67.9 kg (149 lb 11.2 oz)    Examination:  General exam: Appears calm and comfortable ,Not in distress,average built Respiratory system: Bilateral equal air entry, normal vesicular breath sounds, no wheezes or crackles  Cardiovascular system: S1 & S2 heard, RRR. No JVD, murmurs, rubs, gallops or clicks. No pedal edema. Gastrointestinal system: : Abdomen is nondistended, soft and nontender. No organomegaly or masses felt. Normal bowel sounds heard. Previous G-tube site  draining yellow mucus but looks better today. Central nervous system: Alert and oriented. No focal neurological deficits. Extremities: No edema, no clubbing ,no cyanosis, distal peripheral pulses palpable. Skin: No cyanosis,No pallor,No Rash,No Ulcer Psychiatry: Judgement and insight  appear normal. Mood & affect appropriate.     Data Reviewed: I have personally reviewed following labs and imaging studies  CBC: Recent Labs  Lab 01/20/17 0440 01/21/17 0615 01/22/17 0609 01/23/17 0530 01/25/17 0735  WBC 2.6* 3.3* 3.2* 4.0 4.9  NEUTROABS 1.6* 2.1 1.9 2.5 3.5  HGB 8.5* 8.6* 8.1* 8.2* 8.0*  HCT 25.8* 25.3* 24.4* 23.9* 23.7*  MCV 96.3 95.1 95.3 93.7 93.7  PLT 141* 137* 132* 138* 510   Basic Metabolic Panel: Recent Labs  Lab 01/22/17 0609 01/23/17 0530 01/24/17 0500 01/25/17 0735 01/26/17 0611  NA 136 134* 133* 141 133*  K 3.5 3.4* 3.7 4.5 3.3*  CL 105 101 100* 102 98*  CO2 24 26 26 29 31   GLUCOSE 116* 235* 267* 122* 127*  BUN 12 11 10 12 15   CREATININE 0.60* 0.56* 0.60* 0.60* 0.70  CALCIUM 8.4* 8.2* 8.4* 9.3 8.5*  MG 1.6* 1.5* 2.0 1.7 1.7  PHOS 2.3* 2.2* 2.6 3.0 3.3   GFR: Estimated Creatinine Clearance: 74.3 mL/min (by C-G formula based on SCr of 0.7 mg/dL). Liver Function Tests: Recent Labs  Lab 01/23/17 0530 01/24/17 0500  AST 14* 15  ALT 8* 7*  ALKPHOS 47 54  BILITOT 0.4 0.4  PROT 4.9* 5.4*  ALBUMIN 2.1* 2.3*   No results for input(s): LIPASE, AMYLASE in the last 168 hours. No results for input(s): AMMONIA in the last 168 hours. Coagulation Profile: No results for input(s): INR, PROTIME in the last 168 hours. Cardiac Enzymes: No results for input(s): CKTOTAL, CKMB, CKMBINDEX, TROPONINI in the last 168 hours. BNP (last 3 results) No results for input(s): PROBNP in the last 8760 hours. HbA1C: No results for input(s): HGBA1C in the last 72 hours. CBG: Recent Labs  Lab 01/25/17 1732 01/25/17 2048 01/26/17 0022 01/26/17 0413 01/26/17 0749  GLUCAP 269* 239* 203* 191* 132*   Lipid Profile: No results for input(s): CHOL, HDL, LDLCALC, TRIG, CHOLHDL, LDLDIRECT in the last 72 hours. Thyroid Function Tests: No results for input(s): TSH, T4TOTAL, FREET4, T3FREE, THYROIDAB in the last 72 hours. Anemia Panel: No results for input(s):  VITAMINB12, FOLATE, FERRITIN, TIBC, IRON, RETICCTPCT in the last 72 hours. Sepsis Labs: No results for input(s): PROCALCITON, LATICACIDVEN in the last 168 hours.  Recent Results (from the past 240 hour(s))  Culture, blood (routine x 2)     Status: None   Collection Time: 01/17/17  9:40 PM  Result Value Ref Range Status   Specimen Description BLOOD PORTA CATH RIGHT CHEST  Final   Special Requests   Final    BOTTLES DRAWN AEROBIC AND ANAEROBIC Blood Culture adequate volume   Culture   Final    NO GROWTH 5 DAYS Performed at [facility], [address] [place],  [address]    Report Status 01/23/2017 FINAL  Final  Culture, blood (routine x 2)     Status: None   Collection Time: 01/17/17  9:45 PM  Result Value Ref Range Status   Specimen Description BLOOD LEFT ANTECUBITAL  Final   Special Requests   Final    BOTTLES DRAWN AEROBIC AND ANAEROBIC Blood Culture adequate volume   Culture   Final    NO GROWTH 5 DAYS Performed at [facility], [place] [address],  [place], [place] [address]    Report Status 01/22/2017 FINAL  Final         Radiology Studies: No results found.      Scheduled Meds: . cephALEXin  500 mg Oral Q6H  . clopidogrel  75 mg Oral Daily  . dexamethasone  2 mg Intravenous Q24H  . insulin aspart  0-20 Units Subcutaneous Q4H  . pantoprazole (PROTONIX) IV  40 mg Intravenous Q24H  . Zinc Oxide   Topical Q4H   Continuous Infusions: . Marland KitchenTPN (CLINIMIX-E) Adult     And  . fat emulsion    . sodium chloride 25 mL/hr at 01/25/17 1703  . sodium chloride    . famotidine (PEPCID) IV Stopped (01/25/17 2332)  . magnesium sulfate 1 - 4 g bolus IVPB    . potassium chloride 10 mEq (01/26/17 1018)  . Marland KitchenTPN (CLINIMIX-E) Adult 50 mL/hr at 01/25/17 1803     LOS: 9 days    Time spent: 25 mins    Aryn Kops Jodie Echevaria, MD [facility] Pager [phone]  If 7PM-7AM, please contact night-coverage [email or web address] Password [facility] 01/26/2017, 11:25 AM

## 2017-01-27 LAB — GLUCOSE, CAPILLARY
GLUCOSE-CAPILLARY: 178 mg/dL — AB (ref 65–99)
Glucose-Capillary: 141 mg/dL — ABNORMAL HIGH (ref 65–99)
Glucose-Capillary: 146 mg/dL — ABNORMAL HIGH (ref 65–99)
Glucose-Capillary: 149 mg/dL — ABNORMAL HIGH (ref 65–99)
Glucose-Capillary: 182 mg/dL — ABNORMAL HIGH (ref 65–99)
Glucose-Capillary: 202 mg/dL — ABNORMAL HIGH (ref 65–99)
Glucose-Capillary: 252 mg/dL — ABNORMAL HIGH (ref 65–99)

## 2017-01-27 LAB — BASIC METABOLIC PANEL
Anion gap: 6 (ref 5–15)
BUN: 15 mg/dL (ref 6–20)
CO2: 29 mmol/L (ref 22–32)
CREATININE: 0.62 mg/dL (ref 0.61–1.24)
Calcium: 8.9 mg/dL (ref 8.9–10.3)
Chloride: 102 mmol/L (ref 101–111)
GFR calc Af Amer: >60 mL/min (ref >60)
GFR calc non Af Amer: >60 mL/min (ref >60)
GLUCOSE: 171 mg/dL — AB (ref 65–99)
Potassium: 4.7 mmol/L (ref 3.5–5.1)
Sodium: 137 mmol/L (ref 135–145)

## 2017-01-27 LAB — PHOSPHORUS: Phosphorus: 3 mg/dL (ref 2.5–4.6)

## 2017-01-27 LAB — MAGNESIUM: Magnesium: 1.6 mg/dL — ABNORMAL LOW (ref 1.7–2.4)

## 2017-01-27 MED ORDER — MAGNESIUM SULFATE 2 GM/50ML IV SOLN
2.0000 g | Freq: Once | INTRAVENOUS | Status: AC
  Administered 2017-01-27: 2 g via INTRAVENOUS
  Filled 2017-01-27: qty 50

## 2017-01-27 MED ORDER — FAT EMULSION 20 % IV EMUL
240.0000 mL | INTRAVENOUS | Status: AC
Start: 2017-01-27 — End: 2017-01-28
  Administered 2017-01-27: 240 mL via INTRAVENOUS
  Filled 2017-01-27: qty 250

## 2017-01-27 MED ORDER — TRACE MINERALS CR-CU-MN-SE-ZN 10-1000-500-60 MCG/ML IV SOLN
INTRAVENOUS | Status: AC
  Administered 2017-01-27: 19:00:00 via INTRAVENOUS
  Filled 2017-01-27: qty 1560

## 2017-01-27 NOTE — Progress Notes (Signed)
PROGRESS NOTE    Andres Smith  OIN:[id number] DOB: 13-Apr-1940 DOA: 01/17/2017 PCP: Janith Lima, MD   Brief Narrative: Patient is a 77 year old male with past medical history of buccal mucosa squamous cell cancer, dysphagia with G-tube in place, atrial fibrillation, coronary artery disease status post CABG, recent CVA was admitted for the evaluation of malfunction of his G-tube and possible abdominal cellulitis. G-tube was removed.  Abdominal wall cellulitis is being treated with antibiotics.  Currently he is on TPN as he is unable to tolerate by mouth because when he starts eating, there was leakage of gastric secretions from the previous G tube site.  Hoping for closure of the G-tube site so that patient can be started on oral feeding.  Assessment & Plan:   Active Problems:   PEG tube malfunction (HCC)   Protein-calorie malnutrition, severe  G-tube malfunction with abdominal cellulitis: G-tube removed by IR. Vancomycin switched to Rocephin for cellulitis.  Cultures were negative. Continue wound care around the area of the cellulitis Currently NPO. Ice chips ok Wound care following.  Continue PPI Hoping for the healing of the leak and starting on clear liquid diet soon  Moderate to severe protein calorie malnutrition/failure to thrive: Patient was afraid to eat because of pain/leakage of the gastric contents from the previous feeding tube site. Started on TPN on 01/22/17.  Pharmacy on board.  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 Alvy Bimler .  Not a candidate for chemotherapy now. Currently he can swallow food but afraid to eat due to abdominal pain and gastric leakage. Oncology following here.  History of recent CVA: On Plavix.  Continue statin  Coronary artery disease: Status post CABG.  Stable  Hypertension: Currently blood pressure stable.  We will continue to monitor.  Crohn's disease: Steroids ordered by oncology.  Continue supportive  care.  Hypokalemia: Supplemented .  We will continue to monitor the levels.  Pancytopneia: Secondary to chemotherapy.  We will continue to monitor the counts.  Generalized weakness: Physical therapy ordered.  Patient is ambulating.  Hyperglycemia: Continue sliding scale insulin.    DVT prophylaxis: SCD Code Status: Partial code.  No intubation.  CPR okay Family Communication: No family member  present at the bedside Disposition Plan: Home once clinically improves   Consultants: IR, oncology  Procedures: None  Antimicrobials:  Ceftriaxone since 01/20/17 Vancomycin 01/17/17-01/20/17  Subjective: Patient seen and examined the bedside this morning.  Remains comfortable .Had a bowel movement today. Previous  G-tube site healing.  Patient ambulating  Objective: Vitals:   01/26/17 1305 01/26/17 2048 01/27/17 0555 01/27/17 1011  BP: 115/62 132/78 138/75 120/64  Pulse: 81 60 75 100  Resp: 18 20 20 20   Temp: (!) 97.5 F (36.4 C) (!) 97.4 F (36.3 C) 98.1 F (36.7 C) 97.7 F (36.5 C)  TempSrc: Oral Axillary Axillary Oral  SpO2: 99% 98% 98% 100%  Weight:      Height:        Intake/Output Summary (Last 24 hours) at 01/27/2017 1235 Last data filed at 01/27/2017 0735 Gross per 24 hour  Intake 1765.76 ml  Output 1700 ml  Net 65.76 ml   Filed Weights   01/17/17 2326 01/20/17 0428 01/21/17 0610  Weight: 66.4 kg (146 lb 6.2 oz) 66.6 kg (146 lb 13.2 oz) 67.9 kg (149 lb 11.2 oz)    Examination:  General exam: Appears calm and comfortable ,Not in distress,average built Respiratory system: Bilateral equal air entry, normal vesicular breath sounds, no  wheezes or crackles  Cardiovascular system: S1 & S2 heard, RRR. No JVD, murmurs, rubs, gallops or clicks.  Gastrointestinal system:  Abdomen is nondistended, soft and nontender. No organomegaly or masses felt. Normal bowel sounds heard. Previous G-tube site draining yellow mucus but continues to looks better  Central nervous system:  Alert and oriented. No focal neurological deficits. Extremities: No edema, no clubbing ,no cyanosis, distal peripheral pulses palpable. Skin: No cyanosis,No pallor,No Rash,No Ulcer Psychiatry: Judgement and insight appear normal. Mood & affect appropriate.  GU: No Foley  Data Reviewed: I have personally reviewed following labs and imaging studies  CBC: Recent Labs  Lab 01/21/17 0615 01/22/17 0609 01/23/17 0530 01/25/17 0735  WBC 3.3* 3.2* 4.0 4.9  NEUTROABS 2.1 1.9 2.5 3.5  HGB 8.6* 8.1* 8.2* 8.0*  HCT 25.3* 24.4* 23.9* 23.7*  MCV 95.1 95.3 93.7 93.7  PLT 137* 132* 138* 419   Basic Metabolic Panel: Recent Labs  Lab 01/23/17 0530 01/24/17 0500 01/25/17 0735 01/26/17 0611 01/27/17 0532  NA 134* 133* 141 133* 137  K 3.4* 3.7 4.5 3.3* 4.7  CL 101 100* 102 98* 102  CO2 26 26 29 31 29   GLUCOSE 235* 267* 122* 127* 171*  BUN 11 10 12 15 15   CREATININE 0.56* 0.60* 0.60* 0.70 0.62  CALCIUM 8.2* 8.4* 9.3 8.5* 8.9  MG 1.5* 2.0 1.7 1.7 1.6*  PHOS 2.2* 2.6 3.0 3.3 3.0   GFR: Estimated Creatinine Clearance: 74.3 mL/min (by C-G formula based on SCr of 0.62 mg/dL). Liver Function Tests: Recent Labs  Lab 01/23/17 0530 01/24/17 0500  AST 14* 15  ALT 8* 7*  ALKPHOS 47 54  BILITOT 0.4 0.4  PROT 4.9* 5.4*  ALBUMIN 2.1* 2.3*   No results for input(s): LIPASE, AMYLASE in the last 168 hours. No results for input(s): AMMONIA in the last 168 hours. Coagulation Profile: No results for input(s): INR, PROTIME in the last 168 hours. Cardiac Enzymes: No results for input(s): CKTOTAL, CKMB, CKMBINDEX, TROPONINI in the last 168 hours. BNP (last 3 results) No results for input(s): PROBNP in the last 8760 hours. HbA1C: No results for input(s): HGBA1C in the last 72 hours. CBG: Recent Labs  Lab 01/26/17 1954 01/27/17 0027 01/27/17 0448 01/27/17 0727 01/27/17 1147  GLUCAP 229* 182* 149* 146* 178*   Lipid Profile: No results for input(s): CHOL, HDL, LDLCALC, TRIG, CHOLHDL,  LDLDIRECT in the last 72 hours. Thyroid Function Tests: No results for input(s): TSH, T4TOTAL, FREET4, T3FREE, THYROIDAB in the last 72 hours. Anemia Panel: No results for input(s): VITAMINB12, FOLATE, FERRITIN, TIBC, IRON, RETICCTPCT in the last 72 hours. Sepsis Labs: No results for input(s): PROCALCITON, LATICACIDVEN in the last 168 hours.  Recent Results (from the past 240 hour(s))  Culture, blood (routine x 2)     Status: None   Collection Time: 01/17/17  9:40 PM  Result Value Ref Range Status   Specimen Description BLOOD PORTA CATH RIGHT CHEST  Final   Special Requests   Final    BOTTLES DRAWN AEROBIC AND ANAEROBIC Blood Culture adequate volume   Culture   Final    NO GROWTH 5 DAYS Performed at [facility], [address], [place], [place] [address]    Report Status 01/23/2017 FINAL  Final  Culture, blood (routine x 2)     Status: None   Collection Time: 01/17/17  9:45 PM  Result Value Ref Range Status   Specimen Description BLOOD LEFT ANTECUBITAL  Final   Special Requests   Final  BOTTLES DRAWN AEROBIC AND ANAEROBIC Blood Culture adequate volume   Culture   Final    NO GROWTH 5 DAYS Performed at [facility], [place] [address], [place], [place] [address]    Report Status 01/22/2017 FINAL  Final         Radiology Studies: No results found.      Scheduled Meds: . clopidogrel  75 mg Oral Daily  . dexamethasone  2 mg Intravenous Q24H  . insulin aspart  0-20 Units Subcutaneous Q4H  . pantoprazole (PROTONIX) IV  40 mg Intravenous Q24H  . Zinc Oxide   Topical Q4H   Continuous Infusions: . Marland KitchenTPN (CLINIMIX-E) Adult 65 mL/hr at 01/27/17 0322  . Marland KitchenTPN (CLINIMIX-E) Adult     And  . fat emulsion    . sodium chloride 10 mL/hr at 01/27/17 0501  . cefTRIAXone (ROCEPHIN)  IV Stopped (01/26/17 1413)  . famotidine (PEPCID) IV Stopped (01/26/17 2048)  . magnesium sulfate 1 - 4 g bolus IVPB       LOS: 10 days    Time spent: 25 mins    Hjalmar Ballengee Jodie Echevaria,  MD [facility] Pager [phone]  If 7PM-7AM, please contact night-coverage [email or web address] Password [facility] 01/27/2017, 12:35 PM

## 2017-01-27 NOTE — Progress Notes (Signed)
[place] NOTE   Pharmacy Consult for short-term trial of TPN for 7 days. Indication: unable to tolerate PO intake  Patient Measurements: Body mass index is 22.76 kg/m. Filed Weights   01/17/17 2326 01/20/17 0428 01/21/17 0610  Weight: 146 lb 6.2 oz (66.4 kg) 146 lb 13.2 oz (66.6 kg) 149 lb 11.2 oz (67.9 kg)  Ideal Body Weight:  70 kg  HPI:  18 yoM admitted on 1/17 with c/o G-tube leaking blood and gastric contents around exit site causing erosion of surrounding skin.  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  IR converted to larger G-J tube on 1/15, but it has continued to leak.  On 1/19 the [place] tube was permanently removed without complications.  He continues to have leakage through the non-healing tube site and is therefore unable to meet nutrition goals with oral diet. Pharmacy is consulted for trial of short-term TPN to provide nutrition and avoid oral intake that is exacerbating the healing process around his feeding tube site.  Significant events:   Insulin requirements past 24 hours: 27 units   Current Nutrition: NPO   IVF: NS at 10 ml/hr  Central access: Implanted port (05/21/16) TPN start date: 01/22/17  ASSESSMENT                                                                                                          Today:   Glucose:  goal range < 150.  Elevated CBGs 146-229  Currently only 1/2 rate TPN  Prednisone 10mg  daily from home and 1/22-1/23, changed to Dexamethasone IV 2mg /day on 1/23.  Likely contributing to hyperglycemia  Electrolytes:  Na, K+, Phos WNL. Magnesium low.  Monitor for refeeding syndrome.  Renal:  Stable, Scr~0.7  LFTs:  Low/stable  TGs:  91 (1/23)  Prealbumin:  <5 (1/23)   NUTRITIONAL GOALS                                                                                             RD recs  (1/18):  110-120 Protein, 2350-2550 Kcal  Clinimix 5/20 at a goal rate of 83 ml/hr + 20% fat emulsion 240 ml/day to provide: 100 g of protein and 2233 kCals per day meeting 91 % of protein and 95 % of kCal needs  - Glucose infusion rate will be 4.07 mg/kg/min (Maximum 5 mg/kg/min)   PLAN  Now: Magnesium Sulfate 2gm IV x 1  At 1800 today:  Continue Clinimix E 5/20 at 65 ml/hr   TPN to contain standard multivitamins and trace element daily.  Regular insulin in TPN - patient to receive 30 units / 24 hrs.  20% fat emulsion at 20 ml/hr x12 hours/day  IVF at 10 ml/hr.  continue CBGs and resistant SSI q4h.   TPN lab panels on Mondays & Thursdays.  Monitor for re-feeding syndrome & plans to initiate oral diet  Netta Cedars, PharmD, BCPS Pager: [phone] 01/27/2017, 9:23 AM

## 2017-01-28 ENCOUNTER — Encounter (HOSPITAL_COMMUNITY): Payer: Self-pay | Admitting: Internal Medicine

## 2017-01-28 DIAGNOSIS — K219 Gastro-esophageal reflux disease without esophagitis: Secondary | ICD-10-CM

## 2017-01-28 DIAGNOSIS — L8990 Pressure ulcer of unspecified site, unspecified stage: Secondary | ICD-10-CM

## 2017-01-28 DIAGNOSIS — D6481 Anemia due to antineoplastic chemotherapy: Secondary | ICD-10-CM

## 2017-01-28 DIAGNOSIS — T451X5A Adverse effect of antineoplastic and immunosuppressive drugs, initial encounter: Secondary | ICD-10-CM

## 2017-01-28 LAB — CBC
HCT: 22.6 % — ABNORMAL LOW (ref 39.0–52.0)
HEMOGLOBIN: 7.6 g/dL — AB (ref 13.0–17.0)
MCH: 31.5 pg (ref 26.0–34.0)
MCHC: 33.6 g/dL (ref 30.0–36.0)
MCV: 93.8 fL (ref 78.0–100.0)
PLATELETS: 158 10*3/uL (ref 150–400)
RBC: 2.41 MIL/uL — AB (ref 4.22–5.81)
RDW: 16 % — ABNORMAL HIGH (ref 11.5–15.5)
WBC: 6.5 10*3/uL (ref 4.0–10.5)

## 2017-01-28 LAB — DIFFERENTIAL
Basophils Absolute: 0 10*3/uL (ref 0.0–0.1)
Basophils Relative: 0 %
Eosinophils Absolute: 0 10*3/uL (ref 0.0–0.7)
Eosinophils Relative: 0 %
LYMPHS ABS: 0.6 10*3/uL — AB (ref 0.7–4.0)
LYMPHS PCT: 10 %
Monocytes Absolute: 0.8 10*3/uL (ref 0.1–1.0)
Monocytes Relative: 12 %
NEUTROS ABS: 5.1 10*3/uL (ref 1.7–7.7)
NEUTROS PCT: 78 %

## 2017-01-28 LAB — PREALBUMIN: PREALBUMIN: 14.5 mg/dL — AB (ref 18–38)

## 2017-01-28 LAB — TRIGLYCERIDES: TRIGLYCERIDES: 60 mg/dL (ref <150)

## 2017-01-28 LAB — COMPREHENSIVE METABOLIC PANEL
ALT: 14 U/L — AB (ref 17–63)
ANION GAP: 5 (ref 5–15)
AST: 26 U/L (ref 15–41)
Albumin: 2.3 g/dL — ABNORMAL LOW (ref 3.5–5.0)
Alkaline Phosphatase: 57 U/L (ref 38–126)
BUN: 18 mg/dL (ref 6–20)
CALCIUM: 8.8 mg/dL — AB (ref 8.9–10.3)
CHLORIDE: 102 mmol/L (ref 101–111)
CO2: 29 mmol/L (ref 22–32)
CREATININE: 0.63 mg/dL (ref 0.61–1.24)
Glucose, Bld: 131 mg/dL — ABNORMAL HIGH (ref 65–99)
Potassium: 3.6 mmol/L (ref 3.5–5.1)
SODIUM: 136 mmol/L (ref 135–145)
Total Bilirubin: 0.2 mg/dL — ABNORMAL LOW (ref 0.3–1.2)
Total Protein: 5.1 g/dL — ABNORMAL LOW (ref 6.5–8.1)

## 2017-01-28 LAB — GLUCOSE, CAPILLARY
GLUCOSE-CAPILLARY: 120 mg/dL — AB (ref 65–99)
GLUCOSE-CAPILLARY: 138 mg/dL — AB (ref 65–99)
GLUCOSE-CAPILLARY: 174 mg/dL — AB (ref 65–99)
GLUCOSE-CAPILLARY: 179 mg/dL — AB (ref 65–99)
GLUCOSE-CAPILLARY: 241 mg/dL — AB (ref 65–99)
Glucose-Capillary: 165 mg/dL — ABNORMAL HIGH (ref 65–99)

## 2017-01-28 LAB — MAGNESIUM: MAGNESIUM: 2 mg/dL (ref 1.7–2.4)

## 2017-01-28 LAB — PHOSPHORUS: Phosphorus: 3.2 mg/dL (ref 2.5–4.6)

## 2017-01-28 MED ORDER — FAT EMULSION 20 % IV EMUL
240.0000 mL | INTRAVENOUS | Status: AC
  Administered 2017-01-28: 240 mL via INTRAVENOUS
  Filled 2017-01-28: qty 250

## 2017-01-28 MED ORDER — TRACE MINERALS CR-CU-MN-SE-ZN 10-1000-500-60 MCG/ML IV SOLN
INTRAVENOUS | Status: AC
  Administered 2017-01-28: 18:00:00 via INTRAVENOUS
  Filled 2017-01-28: qty 1992

## 2017-01-28 NOTE — Progress Notes (Addendum)
PROGRESS NOTE    Andres Smith  TKP:[id number] DOB: [birth year] DOA: 01/17/2017 PCP: Janith Lima, MD   Brief Narrative: Patient is a 77 year old male with past medical history of buccal mucosa squamous cell cancer, dysphagia with G-tube in place, atrial fibrillation, coronary artery disease status post CABG, recent CVA was admitted for the evaluation of malfunction of his G-tube and possible abdominal cellulitis. G-tube was removed.  Abdominal wall cellulitis is being treated with antibiotics.  Currently he is on TPN as he is unable to tolerate by mouth because when he starts eating, there was leakage of gastric contents  from the previous G tube site.  Hoping for closure of the G-tube site so that patient can be started on oral feeding.  Assessment & Plan:   Principal Problem:   Malfunction of gastrostomy tube ([facility]) Active Problems:   CROHN'S DISEASE-LARGE INTESTINE   Protein-calorie malnutrition, severe   Pressure injury of skin   Anemia associated with chemotherapy  G-tube malfunction with abdominal cellulitis: S/P G-tube removed by IR. Vancomycin switched to Rocephin for cellulitis.  Cultures were negative.He will finish the antibiotics course soon. Continue wound care around the area of the cellulitis Currently NPO. Ice chips ok Wound care following.  Continue PPI Hoping for the healing of the leak and starting on clear liquid diet soon Today we checked the leakage at the bedside.  He was given a glass of water through mouth and immediately after 1-2 minutes the water started flowing from his previous G-tube site. Patient did not feel significant pain. Regardless, the cellulitic area has been healing.  Moderate to severe protein calorie malnutrition/failure to thrive: Patient was afraid to eat because of pain/leakage of the gastric contents from the previous feeding tube site. Started on TPN on 01/22/17.  Pharmacy on board.  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 Alvy Bimler .  Not a candidate for chemotherapy now. Currently he can swallow food but afraid to eat due to abdominal pain and gastric leakage. Oncology following here.  History of recent CVA: On Plavix.  Continue statin  Coronary artery disease: Status post CABG.  Stable  Hypertension: Currently blood pressure stable.  We will continue to monitor.  Crohn's disease: Steroids ordered by oncology.  Continue supportive care.  Hypokalemia: Supplemented .  We will continue to monitor the levels.  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 Hb 7.6 today.  Will transfuse as necessary  Generalized weakness: Physical therapy following.  Patient is ambulating.  Hyperglycemia: Continue sliding scale insulin.   Stage 1 pressure ulcer: Present on the coccygeal area.  No drainage, or breakage of skin.  Supportive care   DVT prophylaxis: SCD Code Status: Partial code.  No intubation.  CPR okay Family Communication: Family members present at the bedside Disposition Plan: Home once clinically improves, unknown time   Consultants: IR, oncology  Procedures: None  Antimicrobials:  Ceftriaxone since 01/20/17 Vancomycin 01/17/17-01/20/17  Subjective: Patient seen and examined the bedside this morning.  Remains comfortable.  G tube site leakage was checked with a  glass of water today.  Objective: Vitals:   01/27/17 1011 01/27/17 1449 01/27/17 2002 01/28/17 0410  BP: 120/64 (!) 104/59 130/75 108/61  Pulse: 100 98 67 78  Resp: 20 16 15 14   Temp: 97.7 F (36.5 C) 98 F (36.7 C) (!) 97.5 F (36.4 C) 98.3 F (36.8 C)  TempSrc: Oral Oral Oral Oral  SpO2: 100% 98% 98% 98%  Weight:    67.8 kg (149 lb  7.6 oz)  Height:        Intake/Output Summary (Last 24 hours) at 01/28/2017 1219 Last data filed at 01/28/2017 0437 Gross per 24 hour  Intake 1911.25 ml  Output 1100 ml  Net 811.25 ml   Filed Weights   01/20/17 0428 01/21/17 0610 01/28/17 0410  Weight: 66.6 kg (146  lb 13.2 oz) 67.9 kg (149 lb 11.2 oz) 67.8 kg (149 lb 7.6 oz)    Examination: General exam: Appears calm and comfortable ,Not in distress,average built Respiratory system: Bilateral equal air entry, normal vesicular breath sounds, no wheezes or crackles  Cardiovascular system: S1 & S2 heard, RRR. No JVD, murmurs, rubs, gallops or clicks.  Gastrointestinal system: Abdomen soft,nondistended,nontender Previous G-tube site draining yellow mucus but continues to looks better  Central nervous system: Alert and oriented. No focal neurological deficits. Extremities: No edema, no clubbing ,no cyanosis, distal peripheral pulses palpable. Skin: No cyanosis,No pallor,No Rash,No Ulcer Psychiatry: Judgement and insight appear normal. Mood & affect appropriate.  GU: No Foley Data Reviewed: I have personally reviewed following labs and imaging studies  CBC: Recent Labs  Lab 01/22/17 0609 01/23/17 0530 01/25/17 0735 01/28/17 0446  WBC 3.2* 4.0 4.9 6.5  NEUTROABS 1.9 2.5 3.5 5.1  HGB 8.1* 8.2* 8.0* 7.6*  HCT 24.4* 23.9* 23.7* 22.6*  MCV 95.3 93.7 93.7 93.8  PLT 132* 138* 150 884   Basic Metabolic Panel: Recent Labs  Lab 01/24/17 0500 01/25/17 0735 01/26/17 0611 01/27/17 0532 01/28/17 0446  NA 133* 141 133* 137 136  K 3.7 4.5 3.3* 4.7 3.6  CL 100* 102 98* 102 102  CO2 26 29 31 29 29   GLUCOSE 267* 122* 127* 171* 131*  BUN 10 12 15 15 18   CREATININE 0.60* 0.60* 0.70 0.62 0.63  CALCIUM 8.4* 9.3 8.5* 8.9 8.8*  MG 2.0 1.7 1.7 1.6* 2.0  PHOS 2.6 3.0 3.3 3.0 3.2   GFR: Estimated Creatinine Clearance: 74.2 mL/min (by C-G formula based on SCr of 0.63 mg/dL). Liver Function Tests: Recent Labs  Lab 01/23/17 0530 01/24/17 0500 01/28/17 0446  AST 14* 15 26  ALT 8* 7* 14*  ALKPHOS 47 54 57  BILITOT 0.4 0.4 0.2*  PROT 4.9* 5.4* 5.1*  ALBUMIN 2.1* 2.3* 2.3*   No results for input(s): LIPASE, AMYLASE in the last 168 hours. No results for input(s): AMMONIA in the last 168  hours. Coagulation Profile: No results for input(s): INR, PROTIME in the last 168 hours. Cardiac Enzymes: No results for input(s): CKTOTAL, CKMB, CKMBINDEX, TROPONINI in the last 168 hours. BNP (last 3 results) No results for input(s): PROBNP in the last 8760 hours. HbA1C: No results for input(s): HGBA1C in the last 72 hours. CBG: Recent Labs  Lab 01/27/17 1719 01/27/17 2001 01/27/17 2349 01/28/17 0413 01/28/17 0750  GLUCAP 252* 202* 141* 120* 165*   Lipid Profile: Recent Labs    01/28/17 0446  TRIG 60   Thyroid Function Tests: No results for input(s): TSH, T4TOTAL, FREET4, T3FREE, THYROIDAB in the last 72 hours. Anemia Panel: No results for input(s): VITAMINB12, FOLATE, FERRITIN, TIBC, IRON, RETICCTPCT in the last 72 hours. Sepsis Labs: No results for input(s): PROCALCITON, LATICACIDVEN in the last 168 hours.  No results found for this or any previous visit (from the past 240 hour(s)).       Radiology Studies: No results found.      Scheduled Meds: . clopidogrel  75 mg Oral Daily  . dexamethasone  2 mg Intravenous Q24H  . insulin aspart  0-20 Units Subcutaneous Q4H  . pantoprazole (PROTONIX) IV  40 mg Intravenous Q24H  . Zinc Oxide   Topical Q4H   Continuous Infusions: . Marland KitchenTPN (CLINIMIX-E) Adult 65 mL/hr at 01/27/17 1836  . Marland KitchenTPN (CLINIMIX-E) Adult     And  . fat emulsion    . sodium chloride 10 mL/hr at 01/27/17 0501  . cefTRIAXone (ROCEPHIN)  IV Stopped (01/27/17 1342)  . famotidine (PEPCID) IV Stopped (01/27/17 2114)     LOS: 11 days    Time spent: 25 mins    Odalys Win Jodie Echevaria, MD [facility] Pager [phone]  If 7PM-7AM, please contact night-coverage [email or web address] Password [facility] 01/28/2017, 12:19 PM

## 2017-01-28 NOTE — Progress Notes (Signed)
[place] NOTE   Pharmacy Consult for short-term trial of TPN for 7 days. Indication: unable to tolerate PO intake  Patient Measurements: Body mass index is 22.73 kg/m. Filed Weights   01/20/17 0428 01/21/17 0610 01/28/17 0410  Weight: 146 lb 13.2 oz (66.6 kg) 149 lb 11.2 oz (67.9 kg) 149 lb 7.6 oz (67.8 kg)  Ideal Body Weight:  70 kg  HPI:  34 yoM admitted on 1/17 with c/o G-tube leaking blood and gastric contents around exit site causing erosion of surrounding skin.  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  IR converted to larger G-J tube on 1/15, but it has continued to leak.  On 1/19 the [place] tube was permanently removed without complications.  He continues to have leakage through the non-healing tube site and is therefore unable to meet nutrition goals with oral diet. Pharmacy is consulted for trial of short-term TPN to provide nutrition and avoid oral intake that is exacerbating the healing process around his feeding tube site.  Significant events: to try drinking water today to look for leakage at previous G tube site  Insulin requirements past 24 hours: 28 units   Current Nutrition: NPO   IVF: NS at 10 ml/hr  Central access: Implanted port (05/21/16) TPN start date: 01/22/17  ASSESSMENT                                                                                                          Today:   Glucose:  goal range < 150.  Elevated CBGs 120-202 after insulin increased to 30/bag yesterday  Currently TPN at 80% of goal rate  Prednisone 10mg  daily from home and 1/22-1/23, changed to Dexamethasone IV 2mg /day on 1/23.  Likely contributing to hyperglycemia  Electrolytes:  Na, K+, Phos WNL. Magnesium 2 after 4 gm mag replacement .  Monitor for refeeding syndrome.  Renal:  Stable, Scr 0.63  LFTs:  Low/stable  TGs:  91 (1/23) 50  (1/28)  Prealbumin:  <5 (1/23), 14.5 (1/28)   NUTRITIONAL GOALS                                                                                             RD recs (1/18):  110-120 Protein, 2350-2550 Kcal  Clinimix 5/20 at a goal rate of 83 ml/hr + 20% fat emulsion 240 ml/day to provide: 100 g of protein and 2233 kCals per day meeting 91 % of protein and 95 % of kCal needs  - Glucose infusion rate will be 4.07 mg/kg/min (Maximum 5 mg/kg/min)   PLAN  At 1800 today:  Increase Clinimix E 5/20 to 83 ml/hr goal rate  TPN to contain standard multivitamins and trace element daily.  Regular insulin in TPN - patient to receive 35 units / 24 hrs.  20% fat emulsion at 20 ml/hr x12 hours/day  IVF at 10 ml/hr.  continue CBGs and resistant SSI q4h.   TPN lab panels on Mondays & Thursdays.  Monitor for re-feeding syndrome & plans to initiate oral diet  Eudelia Bunch, Pharm.D. [phone] 01/28/2017 9:49 AM

## 2017-01-28 NOTE — Care Management Important Message (Signed)
Important Message  Patient Details  Name: TARANCE BALAN MRN: [id number] Date of Birth: Oct 12, 1940   Medicare Important Message Given:  Yes    Kerin Salen 01/28/2017, 1:01 PMImportant Message  Patient Details  Name: CHEVEYO VIRGINIA MRN: [id number] Date of Birth: 10-31-1940   Medicare Important Message Given:  Yes    Kerin Salen 01/28/2017, 1:01 PM

## 2017-01-28 NOTE — Progress Notes (Signed)
Nutrition Follow-up  DOCUMENTATION CODES:   Severe malnutrition in context of chronic illness  INTERVENTION:   Monitor magnesium, potassium, and phosphorus daily for at least 3 days, MD to replete as needed, as pt is at risk for refeeding syndrome givensevere malnutrition and lack of nutrition.  TPN per Pharmacy  NUTRITION DIAGNOSIS:   Severe Malnutrition related to chronic illness, cancer and cancer related treatments as evidenced by percent weight loss, moderate fat depletion, severe fat depletion, severe muscle depletion.  Ongoing.  GOAL:   Patient will meet greater than or equal to 90% of their needs  Progressing.  MONITOR:   Weight trends, Labs, Skin(TPN)  ASSESSMENT:   Pt with PMH significant for buccal mucosa squamous cell carcinoma (chemo/radiation), dysphagia, s/p PEG placement May 2018, CAD, DM, HLD, and HTN. Presents this admission with PEG tube malfunction. Surgery notes pt had PEG tube upsize from 3F to 23F 1/15, but it continues to leak around the site.   TPN (day 6) to advance to goal rate today, Clinimix 5/20 @ 83 ml/hr with ILE @ 20 ml/hr x 12 hours to provide 2233 kcal and 100g protein.  Per chart, pt consumed water today PO and it leaked through g-tube site. Will continue to monitor for diet advancement.  Medications: Rocephin  Labs reviewed: CBGs: 165-179  Diet Order:  Diet NPO time specified .TPN (CLINIMIX-E) Adult .TPN (CLINIMIX-E) Adult  EDUCATION NEEDS:   Education needs have been addressed  Skin:  Skin Assessment: Skin Integrity Issues: Skin Integrity Issues:: Other (Comment) Other: non-pressure abdominal wound  Last BM:  PTA  Height:   Ht Readings from Last 1 Encounters:  01/17/17 5\' 8"  (1.727 m)    Weight:   Wt Readings from Last 1 Encounters:  01/28/17 149 lb 7.6 oz (67.8 kg)    Ideal Body Weight:  70 kg  BMI:  Body mass index is 22.73 kg/m.  Estimated Nutritional Needs:   Kcal:  0981-1914 kcal/day  Protein:   110-120 g/day  Fluid:  >2.3 L/day  Clayton Bibles, MS, RD, LDN [facility] Pager: [phone] After Hours Pager: [phone]

## 2017-01-28 NOTE — Progress Notes (Signed)
Andres Smith   DOB:November 12, 1940   YI#:[id number]    Subjective: He complains of poor sleep.  He is not able to get to a comfortable position.  The wound drainage is less.  He has new pressure sore due to poor mobility.  He walks several times over the weekend.  He had bowel movement last week.  Objective:  Vitals:   01/27/17 2002 01/28/17 0410  BP: 130/75 108/61  Pulse: 67 78  Resp: 15 14  Temp: (!) 97.5 F (36.4 C) 98.3 F (36.8 C)  SpO2: 98% 98%     Intake/Output Summary (Last 24 hours) at 01/28/2017 1120 Last data filed at 01/28/2017 7867 Gross per 24 hour  Intake 1911.25 ml  Output 1100 ml  Net 811.25 ml    GENERAL:alert, no distress and comfortable NEURO: alert & oriented x 3 with fluent speech, no focal motor/sensory deficits   Labs:  Lab Results  Component Value Date   WBC 6.5 01/28/2017   HGB 7.6 (L) 01/28/2017   HCT 22.6 (L) 01/28/2017   MCV 93.8 01/28/2017   PLT 158 01/28/2017   NEUTROABS 5.1 01/28/2017    Lab Results  Component Value Date   NA 136 01/28/2017   K 3.6 01/28/2017   CL 102 01/28/2017   CO2 29 01/28/2017   Assessment & Plan:   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 We will continue supportive care  Acquired pancytopenia [facility]) He has acquired pancytopenia due to chemotherapy He is not symptomatic Observe only. He does not need G-CSF support orblood transfusion Repeat CBC tomorrow  Cancer associated pain The patient has intermittent rib pain We will give him intermittent IV morphine as needed  Dysphagia, leaking around feeding tube Dysphagia is stable He was started on TPN on January 22, 2017 I recommend trial of liquid with water.  If he has no significantly, will slowly advance diet  Slow wound healing around feeding tube site&  GERD Appreciate care from wound care nurse I think that is a component of reflux that exacerbate leakage of gastric content He was startedon proton pump inhibitor in the morning and Pepcid at night since 1/23 with symptomatic improvement  Severe protein calorie malnutrition He is on TPN We will watch out for refeeding syndrome He will continue electrolyte replacement therapy as needed  Chronic history of Crohn's disease He is on low-dose prednisone in the outpatient clinic I will switch him to low-dose IV dexamethasone in the short-term  Profound weakness Encourage PT  Goals of care I recommend short-term trial of TPN for 7 days. Overall he is improving Plan to advance oral diet today  Discharge planning He is not ready to be discharged, maybe next week  [name], MD 01/28/2017  11:20 AM

## 2017-01-29 ENCOUNTER — Encounter (HOSPITAL_COMMUNITY): Payer: Self-pay | Admitting: Internal Medicine

## 2017-01-29 DIAGNOSIS — E876 Hypokalemia: Secondary | ICD-10-CM

## 2017-01-29 LAB — CBC WITH DIFFERENTIAL/PLATELET
BASOS ABS: 0 10*3/uL (ref 0.0–0.1)
Basophils Relative: 0 %
EOS ABS: 0 10*3/uL (ref 0.0–0.7)
EOS PCT: 1 %
HCT: 22.5 % — ABNORMAL LOW (ref 39.0–52.0)
Hemoglobin: 7.6 g/dL — ABNORMAL LOW (ref 13.0–17.0)
LYMPHS PCT: 8 %
Lymphs Abs: 0.5 10*3/uL — ABNORMAL LOW (ref 0.7–4.0)
MCH: 31.8 pg (ref 26.0–34.0)
MCHC: 33.8 g/dL (ref 30.0–36.0)
MCV: 94.1 fL (ref 78.0–100.0)
MONO ABS: 1.1 10*3/uL — AB (ref 0.1–1.0)
Monocytes Relative: 17 %
Neutro Abs: 4.7 10*3/uL (ref 1.7–7.7)
Neutrophils Relative %: 74 %
PLATELETS: 158 10*3/uL (ref 150–400)
RBC: 2.39 MIL/uL — ABNORMAL LOW (ref 4.22–5.81)
RDW: 16.2 % — AB (ref 11.5–15.5)
WBC: 6.3 10*3/uL (ref 4.0–10.5)

## 2017-01-29 LAB — PHOSPHORUS: PHOSPHORUS: 3.5 mg/dL (ref 2.5–4.6)

## 2017-01-29 LAB — BASIC METABOLIC PANEL
Anion gap: 6 (ref 5–15)
BUN: 22 mg/dL — AB (ref 6–20)
CALCIUM: 8.9 mg/dL (ref 8.9–10.3)
CO2: 29 mmol/L (ref 22–32)
CREATININE: 0.67 mg/dL (ref 0.61–1.24)
Chloride: 101 mmol/L (ref 101–111)
GFR calc Af Amer: >60 mL/min (ref >60)
GLUCOSE: 177 mg/dL — AB (ref 65–99)
POTASSIUM: 3.7 mmol/L (ref 3.5–5.1)
SODIUM: 136 mmol/L (ref 135–145)

## 2017-01-29 LAB — GLUCOSE, CAPILLARY
GLUCOSE-CAPILLARY: 241 mg/dL — AB (ref 65–99)
Glucose-Capillary: 161 mg/dL — ABNORMAL HIGH (ref 65–99)
Glucose-Capillary: 174 mg/dL — ABNORMAL HIGH (ref 65–99)
Glucose-Capillary: 176 mg/dL — ABNORMAL HIGH (ref 65–99)
Glucose-Capillary: 251 mg/dL — ABNORMAL HIGH (ref 65–99)

## 2017-01-29 LAB — PREPARE RBC (CROSSMATCH)

## 2017-01-29 LAB — MAGNESIUM: Magnesium: 1.6 mg/dL — ABNORMAL LOW (ref 1.7–2.4)

## 2017-01-29 MED ORDER — DEXAMETHASONE SODIUM PHOSPHATE 4 MG/ML IJ SOLN
1.0000 mg | INTRAMUSCULAR | Status: DC
  Administered 2017-01-29 – 2017-02-12 (×15): 1 mg via INTRAVENOUS
  Filled 2017-01-29 (×15): qty 1

## 2017-01-29 MED ORDER — TRACE MINERALS CR-CU-MN-SE-ZN 10-1000-500-60 MCG/ML IV SOLN
INTRAVENOUS | Status: AC
  Administered 2017-01-29: 17:00:00 via INTRAVENOUS
  Filled 2017-01-29: qty 1992

## 2017-01-29 MED ORDER — FAT EMULSION 20 % IV EMUL
240.0000 mL | INTRAVENOUS | Status: AC
  Administered 2017-01-29: 240 mL via INTRAVENOUS
  Filled 2017-01-29: qty 250

## 2017-01-29 MED ORDER — SODIUM CHLORIDE 0.9 % IV SOLN
Freq: Once | INTRAVENOUS | Status: AC
  Administered 2017-01-29: 14:00:00 via INTRAVENOUS

## 2017-01-29 MED ORDER — MAGNESIUM SULFATE 4 GM/100ML IV SOLN
4.0000 g | Freq: Once | INTRAVENOUS | Status: AC
  Administered 2017-01-29: 4 g via INTRAVENOUS
  Filled 2017-01-29: qty 100

## 2017-01-29 NOTE — Progress Notes (Signed)
Andres Smith   DOB:1940-03-18   CV#:[id number]    Subjective: The patient attempted to drink a glass of water yesterday.  He observe leak soon after the glass of water, indicating that the feeding tube site has not healed.  According to his wife, the site of the wound is improving.  He denies constipation.  He denies significant pain  Objective:  Vitals:   01/28/17 2135 01/29/17 0437  BP: (!) 113/55 (!) 109/57  Pulse: 78 84  Resp: 13 15  Temp: 98 F (36.7 C) 98.9 F (37.2 C)  SpO2: 100% 98%     Intake/Output Summary (Last 24 hours) at 01/29/2017 1025 Last data filed at 01/29/2017 8527 Gross per 24 hour  Intake 2308.75 ml  Output 700 ml  Net 1608.75 ml    GENERAL:alert, no distress and comfortable NEURO: alert & oriented x 3 with fluent speech, no focal motor/sensory deficits   Labs:  Lab Results  Component Value Date   WBC 6.3 01/29/2017   HGB 7.6 (L) 01/29/2017   HCT 22.5 (L) 01/29/2017   MCV 94.1 01/29/2017   PLT 158 01/29/2017   NEUTROABS 4.7 01/29/2017    Lab Results  Component Value Date   NA 136 01/29/2017   K 3.7 01/29/2017   CL 101 01/29/2017   CO2 29 01/29/2017   Assessment & Plan:   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 We will continue supportive care  Acquired pancytopenia [facility]) We discussed some of the risks, benefits, and alternatives of blood transfusions. The patient is symptomatic from anemia and the hemoglobin level is critically low.  Some of the side-effects to be expected including risks of transfusion reactions, chills, infection, syndrome of volume overload and risk of hospitalization from various reasons and the patient is willing to proceed and went ahead to sign consent today. I suspect that blood transfusion may help expedite wound  healing  Cancer associated pain The patient has intermittent rib pain We will give him intermittent IV morphine as needed  Dysphagia, leaking around feeding tube Dysphagia is stable He was started on TPN on January 22, 2017 Since the leak is not healed yet but overall improving, he will continue TPN  Slow wound healing around feeding tube site& GERD Appreciate care from wound care nurse I think that is a component of reflux that exacerbate leakage of gastric content He was startedon proton pump inhibitor in the morning and Pepcid at night since 1/23 with symptomatic improvement I would reduce the dose of steroids and give him blood transfusion to promote wound healing  Severe protein calorie malnutrition He is on TPN We will watch out for refeeding syndrome He will continue electrolyte replacement therapy as needed  Chronic history of Crohn's disease He is on low-dose prednisone in the outpatient clinic I will switch him to low-dose IV dexamethasone in the short-term, plan to reduce the dose to 1 mg IV starting today  Profound weakness Encourage PT  Goals of care I recommend short-term trial of TPN for 7 days. Overall he is improving  but is still dependent on TPN.  Discharge planning He is not ready to be discharged, maybe next week     Andres Lark, MD 01/29/2017  8:34 AM

## 2017-01-29 NOTE — Progress Notes (Signed)
[place] NOTE   Pharmacy Consult for short-term trial of TPN for 7 days. Indication: unable to tolerate PO intake  Patient Measurements: Body mass index is 22.73 kg/m. Filed Weights   01/20/17 0428 01/21/17 0610 01/28/17 0410  Weight: 146 lb 13.2 oz (66.6 kg) 149 lb 11.2 oz (67.9 kg) 149 lb 7.6 oz (67.8 kg)  Ideal Body Weight:  70 kg  HPI:  63 yoM admitted on 1/17 with c/o G-tube leaking blood and gastric contents around exit site causing erosion of surrounding skin.  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  IR converted to larger G-J tube on 1/15, but it has continued to leak.  On 1/19 the [place] tube was permanently removed without complications.  He continues to have leakage through the non-healing tube site and is therefore unable to meet nutrition goals with oral diet. Pharmacy is consulted for trial of short-term TPN to provide nutrition and avoid oral intake that is exacerbating the healing process around his feeding tube site.  Significant events: 1/28 consumed water with leakage at previous G tube site - feeding tube site has not healed>>to continue TPN  Insulin requirements past 24 hours: 26 units   Current Nutrition: NPO   IVF: NS at 10 ml/hr  Central access: Implanted port (05/21/16) TPN start date: 01/22/17  ASSESSMENT                                                                                                          Today:   Glucose:  goal range < 150.  CBGs remain elevated after insulin increased to 35/bag ( 138- 241)  yesterday- decadron to decrease to 1 mg/day today  Prednisone 10mg  daily from home and 1/22-1/23, changed to Dexamethasone IV 2mg /day on 1/23; dose reduced to 1 mg qday 1/29..  Likely contributing to hyperglycemia  Electrolytes:  Na, K+, Phos WNL. Magnesium 1.6 (last replacement 4 gm on Sun 1/27) .  Monitor  for refeeding syndrome.  Renal:  Stable, Scr 0.67  LFTs:  Low/stable  TGs:  91 (1/23) 50 (1/28)  Prealbumin:  <5 (1/23), 14.5 (1/28)   NUTRITIONAL GOALS                                                                                             RD recs (1/28):  110-120 Protein, 2350-2550 Kcal  Clinimix 5/20 at a goal rate of 83 ml/hr + 20% fat emulsion 240 ml/day to provide: 100 g of protein and 2233 kCals per day meeting 91 % of protein and 95 % of kCal needs  -  Glucose infusion rate will be 4.07 mg/kg/min (Maximum 5 mg/kg/min)   PLAN                                                                                Now - magnesium 4 gm bolus                             At 1800 today:  Continue Clinimix E 5/20 at 83 ml/hr goal rate to provide 2233 kcal and ~ 100 gm protein (95% kcal goal and ~ 90 % protein goal)  TPN to contain standard multivitamins and trace element daily.  Regular insulin in TPN - patient to receive 35 units / 24 hrs.   20% fat emulsion at 20 ml/hr x12 hours/day  IVF at 10 ml/hr.  continue CBGs and resistant SSI q4h.   TPN lab panels on Mondays & Thursdays.  Monitor for re-feeding syndrome & plans to initiate oral diet  Eudelia Bunch, Pharm.D. [phone] 01/29/2017 8:41 AM

## 2017-01-29 NOTE — Progress Notes (Addendum)
PROGRESS NOTE    Andres Smith  VZC:[id number] DOB: 15-Jul-1940 DOA: 01/17/2017 PCP: No primary care provider on file.   Brief Narrative: Patient is a 77 year old male with past medical history of buccal mucosa squamous cell cancer, dysphagia with G-tube in place, atrial fibrillation, coronary artery disease status post CABG, recent CVA was admitted for the evaluation of malfunction of his G-tube and possible abdominal cellulitis. G-tube was removed.  Abdominal wall cellulitis around the previous G tube site  was being treated with antibiotics.  Currently he is on TPN as he is unable to tolerate by mouth because when he starts eating, there is leakage of gastric contents  from the previous G tube site.  Hoping for closure of the G-tube site so that patient can be started on oral feeding.  Assessment & Plan:   Principal Problem:   Malfunction of gastrostomy tube ([place]) Active Problems:   CROHN'S DISEASE-LARGE INTESTINE   Protein-calorie malnutrition, severe   Pressure injury of skin   Anemia associated with chemotherapy  G-tube malfunction with abdominal cellulitis: S/P G-tube removal by IR. Cultures were negative.He finished the antibiotics course . Continue wound care around the area of the cellulitis Currently NPO. Ice chips ok Wound care following.  Continue PPI Hoping for the healing of the leak and starting on clear liquid diet soon On 01/28/17 ,we  checked the leakage from previous G tube site  at the bedside.  He was given a glass of water through mouth and immediately after 1-2 minutes the water started flowing from his previous G-tube site. Patient did not feel significant pain. Regardless, the cellulitic area has healed well.  Moderate to severe protein calorie malnutrition/failure to thrive: Patient was afraid to eat because of pain/leakage of the gastric contents from the previous feeding tube site. Started on TPN on 01/22/17.  Pharmacy on board.  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 Alvy Bimler .  Not a candidate for chemotherapy now.Was on Gtube which has been removed. Currently he can swallow food but cannot eat due to abdominal pain and gastric leakage. Oncology following here.  History of recent CVA: On Plavix.  Continue statin  Coronary artery disease: Status post CABG.  Stable  Hypertension: Currently blood pressure stable.  We will continue to monitor.  Crohn's disease: Steroids ordered by oncology.  Continue supportive care.  Hypokalemia/hypomagnesemia: Supplemented .  We will continue to monitor the levels.  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 Hb 7.6 today.  He is being transfused today.  Follow-up CBC tomorrow  Generalized weakness: Physical therapy following.  Patient is ambulating.  Hyperglycemia: Continue sliding scale insulin.   Stage 1 pressure ulcer: Present on the coccygeal area.  No drainage, or breakage of skin.  Continue supportive care   DVT prophylaxis: SCD Code Status: Partial code.  No intubation.  CPR okay Family Communication: Wife present at the bedside Disposition Plan: Home once clinically improves, unknown time   Consultants: IR, oncology  Procedures: None  Antimicrobials:  Ceftriaxone since 01/20/17-01/29/17 Vancomycin 01/17/17-01/20/17  Subjective: Patient seen and examined the bedside this morning.  Remains comfortable.  No new issues/events since yesterday.  Less drainage/secretions noted from the previous G-tube site.  Objective: Vitals:   01/28/17 0410 01/28/17 1402 01/28/17 2135 01/29/17 0437  BP: 108/61 (!) 110/52 (!) 113/55 (!) 109/57  Pulse: 78 92 78 84  Resp: 14 16 13 15   Temp: 98.3 F (36.8 C) 98.1 F (36.7 C) 98 F (36.7 C) 98.9 F (37.2 C)  TempSrc: Oral Oral Oral Oral  SpO2: 98% 100% 100% 98%  Weight: 67.8 kg (149 lb 7.6 oz)     Height:        Intake/Output Summary (Last 24 hours) at 01/29/2017 1318 Last data filed at 01/29/2017  0900 Gross per 24 hour  Intake 2308.75 ml  Output 700 ml  Net 1608.75 ml   Filed Weights   01/20/17 0428 01/21/17 0610 01/28/17 0410  Weight: 66.6 kg (146 lb 13.2 oz) 67.9 kg (149 lb 11.2 oz) 67.8 kg (149 lb 7.6 oz)    Examination: General exam: Appears calm and comfortable ,Not in distress,average built, chronically ill, elderly gentleman Respiratory system: Bilateral equal air entry, normal vesicular breath sounds, no wheezes or crackles  Cardiovascular system: S1 & S2 heard, RRR. No JVD, murmurs, rubs, gallops or clicks.  Gastrointestinal system: Abdomen is nondistended, soft and nontender. No organomegaly or masses felt. Normal bowel sounds heard. Previous G-tube site draining yellow mucus but continues to looks better  Central nervous system: Alert and oriented. No focal neurological deficits. Extremities: No edema, no clubbing ,no cyanosis, distal peripheral pulses palpable. Skin: No cyanosis,No pallor,No Rash,No Ulcer Psychiatry: Judgement and insight appear normal. Mood & affect appropriate.  GU: No Foley   Data Reviewed: I have personally reviewed following labs and imaging studies  CBC: Recent Labs  Lab 01/23/17 0530 01/25/17 0735 01/28/17 0446 01/29/17 0430  WBC 4.0 4.9 6.5 6.3  NEUTROABS 2.5 3.5 5.1 4.7  HGB 8.2* 8.0* 7.6* 7.6*  HCT 23.9* 23.7* 22.6* 22.5*  MCV 93.7 93.7 93.8 94.1  PLT 138* 150 158 371   Basic Metabolic Panel: Recent Labs  Lab 01/25/17 0735 01/26/17 0611 01/27/17 0532 01/28/17 0446 01/29/17 0430  NA 141 133* 137 136 136  K 4.5 3.3* 4.7 3.6 3.7  CL 102 98* 102 102 101  CO2 29 31 29 29 29   GLUCOSE 122* 127* 171* 131* 177*  BUN 12 15 15 18  22*  CREATININE 0.60* 0.70 0.62 0.63 0.67  CALCIUM 9.3 8.5* 8.9 8.8* 8.9  MG 1.7 1.7 1.6* 2.0 1.6*  PHOS 3.0 3.3 3.0 3.2 3.5   GFR: Estimated Creatinine Clearance: 74.2 mL/min (by C-G formula based on SCr of 0.67 mg/dL). Liver Function Tests: Recent Labs  Lab 01/23/17 0530 01/24/17 0500  01/28/17 0446  AST 14* 15 26  ALT 8* 7* 14*  ALKPHOS 47 54 57  BILITOT 0.4 0.4 0.2*  PROT 4.9* 5.4* 5.1*  ALBUMIN 2.1* 2.3* 2.3*   No results for input(s): LIPASE, AMYLASE in the last 168 hours. No results for input(s): AMMONIA in the last 168 hours. Coagulation Profile: No results for input(s): INR, PROTIME in the last 168 hours. Cardiac Enzymes: No results for input(s): CKTOTAL, CKMB, CKMBINDEX, TROPONINI in the last 168 hours. BNP (last 3 results) No results for input(s): PROBNP in the last 8760 hours. HbA1C: No results for input(s): HGBA1C in the last 72 hours. CBG: Recent Labs  Lab 01/28/17 2021 01/28/17 2359 01/29/17 0437 01/29/17 0743 01/29/17 1117  GLUCAP 174* 138* 161* 174* 176*   Lipid Profile: Recent Labs    01/28/17 0446  TRIG 60   Thyroid Function Tests: No results for input(s): TSH, T4TOTAL, FREET4, T3FREE, THYROIDAB in the last 72 hours. Anemia Panel: No results for input(s): VITAMINB12, FOLATE, FERRITIN, TIBC, IRON, RETICCTPCT in the last 72 hours. Sepsis Labs: No results for input(s): PROCALCITON, LATICACIDVEN in the last 168 hours.  No results found for this or any previous visit (from the past 240  hour(s)).       Radiology Studies: No results found.      Scheduled Meds: . clopidogrel  75 mg Oral Daily  . dexamethasone  1 mg Intravenous Q24H  . insulin aspart  0-20 Units Subcutaneous Q4H  . pantoprazole (PROTONIX) IV  40 mg Intravenous Q24H  . Zinc Oxide   Topical Q4H   Continuous Infusions: . Marland KitchenTPN (CLINIMIX-E) Adult 83 mL/hr at 01/28/17 1812  . Marland KitchenTPN (CLINIMIX-E) Adult     And  . fat emulsion    . sodium chloride 10 mL/hr at 01/27/17 0501  . sodium chloride    . famotidine (PEPCID) IV Stopped (01/28/17 2156)     LOS: 12 days    Time spent: 25 mins    Jaxyn Mestas Jodie Echevaria, MD [facility] Pager [phone]  If 7PM-7AM, please contact night-coverage [email or web address] Password [email or web address] 01/29/2017, 1:18 PM

## 2017-01-29 NOTE — Progress Notes (Signed)
The area between to top of the buttock separation  Appears yellow in color , moist and painful while laying down. No visible blanching. He had a foam patch on .When I removed it. This was visible. I applies barrier cream, a foam pad and sent a text to Dr  Tomasa Rand asking for a wound consult

## 2017-01-30 DIAGNOSIS — L89159 Pressure ulcer of sacral region, unspecified stage: Secondary | ICD-10-CM

## 2017-01-30 LAB — GLUCOSE, CAPILLARY
GLUCOSE-CAPILLARY: 141 mg/dL — AB (ref 65–99)
GLUCOSE-CAPILLARY: 148 mg/dL — AB (ref 65–99)
GLUCOSE-CAPILLARY: 161 mg/dL — AB (ref 65–99)
GLUCOSE-CAPILLARY: 162 mg/dL — AB (ref 65–99)
GLUCOSE-CAPILLARY: 215 mg/dL — AB (ref 65–99)
Glucose-Capillary: 165 mg/dL — ABNORMAL HIGH (ref 65–99)

## 2017-01-30 LAB — CBC WITH DIFFERENTIAL/PLATELET
BASOS ABS: 0 10*3/uL (ref 0.0–0.1)
BASOS PCT: 1 %
EOS PCT: 1 %
Eosinophils Absolute: 0 10*3/uL (ref 0.0–0.7)
HCT: 25.9 % — ABNORMAL LOW (ref 39.0–52.0)
Hemoglobin: 8.7 g/dL — ABNORMAL LOW (ref 13.0–17.0)
LYMPHS PCT: 18 %
Lymphs Abs: 1.2 10*3/uL (ref 0.7–4.0)
MCH: 31.2 pg (ref 26.0–34.0)
MCHC: 33.6 g/dL (ref 30.0–36.0)
MCV: 92.8 fL (ref 78.0–100.0)
Monocytes Absolute: 0.5 10*3/uL (ref 0.1–1.0)
Monocytes Relative: 8 %
Neutro Abs: 4.7 10*3/uL (ref 1.7–7.7)
Neutrophils Relative %: 72 %
PLATELETS: 140 10*3/uL — AB (ref 150–400)
RBC: 2.79 MIL/uL — AB (ref 4.22–5.81)
RDW: 16.5 % — ABNORMAL HIGH (ref 11.5–15.5)
WBC: 6.5 10*3/uL (ref 4.0–10.5)

## 2017-01-30 LAB — BASIC METABOLIC PANEL
Anion gap: 4 — ABNORMAL LOW (ref 5–15)
BUN: 24 mg/dL — ABNORMAL HIGH (ref 6–20)
CHLORIDE: 101 mmol/L (ref 101–111)
CO2: 32 mmol/L (ref 22–32)
CREATININE: 0.75 mg/dL (ref 0.61–1.24)
Calcium: 8.8 mg/dL — ABNORMAL LOW (ref 8.9–10.3)
GFR calc non Af Amer: >60 mL/min (ref >60)
Glucose, Bld: 163 mg/dL — ABNORMAL HIGH (ref 65–99)
POTASSIUM: 4.1 mmol/L (ref 3.5–5.1)
SODIUM: 137 mmol/L (ref 135–145)

## 2017-01-30 LAB — TYPE AND SCREEN
ABO/RH(D): A POS
Antibody Screen: NEGATIVE
Unit division: 0

## 2017-01-30 LAB — BPAM RBC
Blood Product Expiration Date: 201902122359
ISSUE DATE / TIME: 201901291325
UNIT TYPE AND RH: 6200

## 2017-01-30 LAB — MAGNESIUM: MAGNESIUM: 1.8 mg/dL (ref 1.7–2.4)

## 2017-01-30 MED ORDER — MAGNESIUM SULFATE 2 GM/50ML IV SOLN
2.0000 g | Freq: Once | INTRAVENOUS | Status: AC
  Administered 2017-01-30: 2 g via INTRAVENOUS
  Filled 2017-01-30: qty 50

## 2017-01-30 MED ORDER — TRACE MINERALS CR-CU-MN-SE-ZN 10-1000-500-60 MCG/ML IV SOLN
INTRAVENOUS | Status: AC
  Administered 2017-01-30: 18:00:00 via INTRAVENOUS
  Filled 2017-01-30: qty 1992

## 2017-01-30 MED ORDER — FAT EMULSION 20 % IV EMUL
240.0000 mL | INTRAVENOUS | Status: AC
  Administered 2017-01-30: 240 mL via INTRAVENOUS
  Filled 2017-01-30: qty 250

## 2017-01-30 NOTE — Progress Notes (Signed)
Andres Smith   DOB:January 24, 1940   OV#:[id number]    Subjective: The patient is observed to have new sacral decubitus ulcer.  The wound around his feeding tube continues to heal slowly.  He received blood transfusion yesterday and appears a bit stronger.  He has no bowel movement for 2 days.  He denies pain  Objective:  Vitals:   01/29/17 2148 01/30/17 0435  BP: 126/73 (!) 105/58  Pulse: 73 76  Resp:  20  Temp: 97.6 F (36.4 C) 98.1 F (36.7 C)  SpO2: 98% 98%     Intake/Output Summary (Last 24 hours) at 01/30/2017 0829 Last data filed at 01/30/2017 0300 Gross per 24 hour  Intake 1300.24 ml  Output 250 ml  Net 1050.24 ml    GENERAL:alert, no distress and comfortable.  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 We will continue supportive care  Acquired pancytopenia [facility]) He had received 1 unit of blood transfusion on January 29, 2017.  Repeat CBC shows stable hemoglobin  Cancer associated pain The patient has intermittent rib pain We will give him intermittent IV morphine as needed  Dysphagia, leaking around feeding tube Dysphagia is stable He was started on TPN on January 22, 2017 Since the leak is not healed yet but overall improving, he will continue TPN  Slow wound healing around feeding tube site& GERD Appreciate care from wound  care nurse I think that is a component of reflux that exacerbate leakage of gastric content He was startedon proton pump inhibitor in the morning and Pepcid at night since 1/23 with symptomatic improvement I would reduce the dose of steroids and give him blood transfusion to promote wound healing  Severe protein calorie malnutrition He is on TPN We will watch out for refeeding syndrome He will continue electrolyte replacement therapy as needed  Sacral decubitus ulcer Appreciate consult to wound care I encouraged the patient to walk around as much as possible  Chronic history of Crohn's disease He is on low-dose prednisone in the outpatient clinic I will switch him to low-dose IV dexamethasone in the short-term, plan to reduce the dose to 1 mg IV starting today  Profound weakness Encourage PT  Goals of care Overall he is improving  but is still dependent on TPN.  Discharge planning He is not ready to be discharged, maybe next week   [name], MD 01/30/2017  8:29 AM

## 2017-01-30 NOTE — Progress Notes (Signed)
[facility]  Pt is already active with [facility] and is providing [place], PT , OT, MS.  [facility] team will follow pt while inpatient to support home care needs as ordered at DC.  If patient discharges after hours, please call [phone].   Larry Sierras 01/30/2017, 10:30 AM

## 2017-01-30 NOTE — Progress Notes (Signed)
[place] NOTE   Pharmacy Consult for short-term trial of TPN for 7 days. Indication: unable to tolerate PO intake  Patient Measurements: Body mass index is 22.73 kg/m. Filed Weights   01/20/17 0428 01/21/17 0610 01/28/17 0410  Weight: 146 lb 13.2 oz (66.6 kg) 149 lb 11.2 oz (67.9 kg) 149 lb 7.6 oz (67.8 kg)  Ideal Body Weight:  70 kg  HPI:  26 yoM admitted on 1/17 with c/o G-tube leaking blood and gastric contents around exit site causing erosion of surrounding skin.  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  IR converted to larger G-J tube on 1/15, but it has continued to leak.  On 1/19 the [place] tube was permanently removed without complications.  He continues to have leakage through the non-healing tube site and is therefore unable to meet nutrition goals with oral diet. Pharmacy is consulted for trial of short-term TPN to provide nutrition and avoid oral intake that is exacerbating the healing process around his feeding tube site.  Significant events: 1/28 consumed water with leakage at previous G tube site - feeding tube site has not healed>>to continue TPN  Insulin requirements past 24 hours: 27 units   Current Nutrition: NPO   IVF: NS at 10 ml/hr  Central access: Implanted port (05/21/16) TPN start date: 01/22/17  ASSESSMENT                                                                                                          Today:   Glucose:  goal range < 150.  CBGs remain elevated with insulin 35/bag (141-250))  - decadron to decrease to 1 mg/day 1/29  Prednisone 10mg  daily from home and 1/22-1/23, changed to Dexamethasone IV 2mg /day on 1/23; dose reduced to 1 mg qday 1/29..  Likely contributing to hyperglycemia  Electrolytes:  Na, K+, Phos WNL. Magnesium 1.8 after 4 gm bolus yesterday.  Monitor for refeeding syndrome.  Renal:   Stable, Scr 0.75  LFTs:  Low/stable  TGs:  91 (1/23) 50 (1/28)  Prealbumin:  <5 (1/23), 14.5 (1/28)   NUTRITIONAL GOALS                                                                                             RD recs (1/28):  110-120 Protein, 2350-2550 Kcal  Clinimix 5/20 at a goal rate of 83 ml/hr + 20% fat emulsion 240 ml/day to provide: 100 g of protein and 2233 kCals per day meeting 91 % of protein and 95 % of kCal needs  - Glucose infusion rate will be 4.07 mg/kg/min (  Maximum 5 mg/kg/min)   PLAN                                                                                Now - magnesium 2 gm bolus                             At 1800 today:  Continue Clinimix E 5/20 at 83 ml/hr goal rate to provide 2233 kcal and ~ 100 gm protein (95% kcal goal and ~ 90 % protein goal)  TPN to contain standard multivitamins and trace element daily.  Regular insulin in TPN - patient to receive 40 units / 24 hrs.   20% fat emulsion at 20 ml/hr x12 hours/day  IVF at 10 ml/hr.  continue CBGs and resistant SSI q4h.   TPN lab panels on Mondays & Thursdays.  Monitor for re-feeding syndrome & plans to initiate oral diet  Eudelia Bunch, Pharm.D. [phone] 01/30/2017 9:39 AM

## 2017-01-30 NOTE — Consult Note (Addendum)
[place] Nurse wound consult note Reason for Consult: partial thickness wound left medial buttock Wound type: MASD intertriginous related  Pressure Injury POA: NA Measurement:2cm x 1.2cm x 0.1cm Wound [id number] Drainage (amount, consistency, odor) scant Periwound: intact Dressing procedure/placement/frequency: I have provided nurses with orders for NS moist to dry dressings BID to left medial buttock wound.  Patient still on TPN for nutritional needs. [place] team still following for PEG site skin damage. Pt wants wife to be present for dressing change. Will check back in a little while.  [place] Nurse wound follow up Wound type: site of previous Peg tube Measurement: 1.2cm round Wound bed: red Drainage (amount, consistency, odor) decreasing but still moderate amount. Patient tried eating recently and the drainage increased greatly so he is no longer eating, TPN and lipids are infusing.Marland Kitchen Periwound: improved but still excoriated distal to opening.  Dressing procedure/placement/frequency: The area distal to the site still has an area of excoriation. This area is much improved from the amount of excoriation it used to have.The problem area is where the Aquacel Ag+ lies on the skin after it has absorbed the acidic drainage. I have rolled one up and inserted into opening and placed a folded one on top of this. Triple paste applied all around. Covered in dry gauze and paper tape.[place] team will continue to follow and be available to this patient and the nursing and medical teams.  Fara Olden, RN-C, WTA-C, [place] Wound Treatment Associate [facility] Care Associate

## 2017-01-30 NOTE — Progress Notes (Addendum)
PROGRESS NOTE    Andres Smith  ZYY:[id number] DOB: 08/14/1940 DOA: 01/17/2017 PCP: No primary care provider on file.   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 G-tube was removed.  Abdominal wall cellulitis around the previous G tube site  was being treated with antibiotics.  Currently he is on TPN as he is unable to tolerate by mouth because when he starts eating, there is leakage of gastric contents  from the previous G tube site.  Hoping for closure of the G-tube site so that patient can be started on oral feeding.  Assessment & Plan:   G-tube malfunction with abdominal cellulitis - S/P G-tube removal by IR. Cultures were negative.He finished the antibiotics course . - Continue wound care around the area of the cellulitis - Wound care following.  Continue PPI - Hoping for the healing of the leak and starting on clear liquid diet soon - On 01/28/17 ,we  checked the leakage from previous G tube site  at the bedside.  He was given a glass of water through mouth and immediately after 1-2 minutes the water started flowing from his previous G-tube site. - Continue TPN  Moderate to severe protein calorie malnutrition/failure to thrive - Continue TPN for nutritional support   Buccal mucosa Squamous cell carcinoma with dysphagia - Follows with oncology Dr. Alvy Bimler .  Not a candidate for chemotherapy now.Was on Gtube which has been removed. - Oncology following   History of recent CVA - On Plavix - Continue statin therapy   Coronary artery disease - Status post CABG.  Stable  Hypertension, essential - Stable   Crohn's disease - Steroids ordered by oncology.  Continue supportive care.  Hypokalemia/hypomagnesemia - Supplemented    Pancytopenia - Secondary to malignancy - S/P 1 U PRBC transfusion 01/30/2016   Generalized weakness - Physical therapy following.  Patient is ambulating.  Hyperglycemia - CBG's in past 24 hours: 161, 165, 141  Stage 1 pressure ulcer - Supportive care   DVT prophylaxis: SCD's  Code Status: Partial code.  No intubation.  CPR okay Family Communication: no family at the bedside  Disposition Plan: home once he feels better    Consultants:   IR, oncology  Procedures:   TPN  Antimicrobials:  Ceftriaxone since 01/20/17-01/29/17  Vancomycin 01/17/17-01/20/17   Subjective: No overnight events.  Objective: Vitals:   01/29/17 1407 01/29/17 1615 01/29/17 2148 01/30/17 0435  BP: 109/61 119/68 126/73 (!) 105/58  Pulse: 60 85 73 76  Resp: 16 16  20   Temp: (!) 97.5 F (36.4 C) 97.8 F (36.6 C) 97.6 F (36.4 C) 98.1 F (36.7 C)  TempSrc: Oral Oral Oral Oral  SpO2: 98% 100% 98% 98%  Weight:      Height:        Intake/Output Summary (Last 24 hours) at 01/30/2017 1130 Last data filed at 01/30/2017 0949 Gross per 24 hour  Intake 1300.24 ml  Output 650 ml  Net 650.24 ml   Filed Weights   01/20/17 0428 01/21/17 0610 01/28/17 0410  Weight: 66.6 kg (146 lb 13.2 oz) 67.9 kg (149 lb 11.2 oz) 67.8 kg (149 lb 7.6 oz)    Physical Exam  Constitutional: Appears well-developed and well-nourished. No distress.  CVS: RRR, S1/S2 + Pulmonary: Effort and breath sounds normal, no stridor, rhonchi,  wheezes, rales.  Abdominal: Soft. BS +,  (+) G tube Musculoskeletal: Normal range of motion. No edema and no tenderness.  Lymphadenopathy: No lymphadenopathy noted, cervical, inguinal. Neuro: Alert. Normal reflexes, muscle tone coordination. No cranial nerve deficit. Skin: Skin is warm and dry. No rash noted. Not diaphoretic. No erythema. No pallor.  Psychiatric: Normal mood and affect. Behavior, judgment, thought content normal.     Data Reviewed: I have personally reviewed following  labs and imaging studies  CBC: Recent Labs  Lab 01/25/17 0735 01/28/17 0446 01/29/17 0430 01/30/17 0807  WBC 4.9 6.5 6.3 6.5  NEUTROABS 3.5 5.1 4.7 4.7  HGB 8.0* 7.6* 7.6* 8.7*  HCT 23.7* 22.6* 22.5* 25.9*  MCV 93.7 93.8 94.1 92.8  PLT 150 158 158 443*   Basic Metabolic Panel: Recent Labs  Lab 01/25/17 0735 01/26/17 0611 01/27/17 0532 01/28/17 0446 01/29/17 0430 01/30/17 0807  NA 141 133* 137 136 136 137  K 4.5 3.3* 4.7 3.6 3.7 4.1  CL 102 98* 102 102 101 101  CO2 29 31 29 29 29  32  GLUCOSE 122* 127* 171* 131* 177* 163*  BUN 12 15 15 18  22* 24*  CREATININE 0.60* 0.70 0.62 0.63 0.67 0.75  CALCIUM 9.3 8.5* 8.9 8.8* 8.9 8.8*  MG 1.7 1.7 1.6* 2.0 1.6* 1.8  PHOS 3.0 3.3 3.0 3.2 3.5  --    GFR: Estimated Creatinine Clearance: 74.2 mL/min (by C-G formula based on SCr of 0.75 mg/dL). Liver Function Tests: Recent Labs  Lab 01/24/17 0500 01/28/17 0446  AST 15 26  ALT 7* 14*  ALKPHOS 54 57  BILITOT 0.4 0.2*  PROT 5.4* 5.1*  ALBUMIN 2.3* 2.3*   No results for input(s): LIPASE, AMYLASE in the last 168 hours. No results for input(s): AMMONIA in the last 168 hours. Coagulation Profile: No results for input(s): INR, PROTIME in the last 168 hours. Cardiac Enzymes: No results for input(s): CKTOTAL, CKMB, CKMBINDEX, TROPONINI in the last 168 hours. BNP (last 3 results) No results for input(s): PROBNP in the last 8760 hours. HbA1C: No results for input(s): HGBA1C in the last 72 hours. CBG: Recent Labs  Lab 01/29/17 1810 01/29/17 2001 01/30/17 0043 01/30/17 0423 01/30/17 0741  GLUCAP 251* 241* 161* 165* 141*   Lipid Profile: Recent Labs    01/28/17 0446  TRIG 60   Thyroid Function Tests: No results for input(s): TSH, T4TOTAL, FREET4, T3FREE, THYROIDAB in the last 72 hours. Anemia Panel: No results for input(s): VITAMINB12, FOLATE, FERRITIN, TIBC, IRON, RETICCTPCT in the last 72 hours. Sepsis Labs: No results for input(s): PROCALCITON, LATICACIDVEN in the  last 168 hours.  No results found for this or any previous visit (from the past 240 hour(s)).       Radiology Studies: No results found.      Scheduled Meds: . clopidogrel  75 mg Oral Daily  . dexamethasone  1 mg Intravenous Q24H  . insulin aspart  0-20 Units Subcutaneous Q4H  . pantoprazole (PROTONIX) IV  40 mg Intravenous Q24H  . Zinc Oxide   Topical Q4H   Continuous Infusions: . Marland KitchenTPN (CLINIMIX-E) Adult 83 mL/hr at 01/29/17 1722  . Marland KitchenTPN (CLINIMIX-E) Adult     And  . fat emulsion    . sodium chloride 10 mL/hr at 01/30/17 0410  . famotidine (PEPCID) IV Stopped (01/29/17 2309)  . magnesium sulfate 1 - 4 g bolus IVPB       LOS: 13 days    Time spent: 25 mins    Leisa Lenz,  MD [facility] Pager [phone]  If 7PM-7AM, please contact night-coverage [email or web address] Password [email or web address] 01/30/2017, 11:30 AM

## 2017-01-31 ENCOUNTER — Ambulatory Visit: Payer: Self-pay | Admitting: Neurology

## 2017-01-31 LAB — GLUCOSE, CAPILLARY
GLUCOSE-CAPILLARY: 135 mg/dL — AB (ref 65–99)
GLUCOSE-CAPILLARY: 142 mg/dL — AB (ref 65–99)
GLUCOSE-CAPILLARY: 158 mg/dL — AB (ref 65–99)
Glucose-Capillary: 147 mg/dL — ABNORMAL HIGH (ref 65–99)
Glucose-Capillary: 146 mg/dL — ABNORMAL HIGH (ref 65–99)
Glucose-Capillary: 148 mg/dL — ABNORMAL HIGH (ref 65–99)

## 2017-01-31 LAB — COMPREHENSIVE METABOLIC PANEL
ALBUMIN: 2.5 g/dL — AB (ref 3.5–5.0)
ALT: 22 U/L (ref 17–63)
ANION GAP: 5 (ref 5–15)
AST: 31 U/L (ref 15–41)
Alkaline Phosphatase: 75 U/L (ref 38–126)
BILIRUBIN TOTAL: 0.6 mg/dL (ref 0.3–1.2)
BUN: 25 mg/dL — ABNORMAL HIGH (ref 6–20)
CALCIUM: 8.8 mg/dL — AB (ref 8.9–10.3)
CO2: 30 mmol/L (ref 22–32)
Chloride: 100 mmol/L — ABNORMAL LOW (ref 101–111)
Creatinine, Ser: 0.67 mg/dL (ref 0.61–1.24)
GLUCOSE: 176 mg/dL — AB (ref 65–99)
Potassium: 3.9 mmol/L (ref 3.5–5.1)
Sodium: 135 mmol/L (ref 135–145)
TOTAL PROTEIN: 5.4 g/dL — AB (ref 6.5–8.1)

## 2017-01-31 LAB — CBC
HCT: 26.4 % — ABNORMAL LOW (ref 39.0–52.0)
Hemoglobin: 8.6 g/dL — ABNORMAL LOW (ref 13.0–17.0)
MCH: 30.5 pg (ref 26.0–34.0)
MCHC: 32.6 g/dL (ref 30.0–36.0)
MCV: 93.6 fL (ref 78.0–100.0)
PLATELETS: 163 10*3/uL (ref 150–400)
RBC: 2.82 MIL/uL — AB (ref 4.22–5.81)
RDW: 16 % — AB (ref 11.5–15.5)
WBC: 6.8 10*3/uL (ref 4.0–10.5)

## 2017-01-31 LAB — MAGNESIUM: MAGNESIUM: 1.9 mg/dL (ref 1.7–2.4)

## 2017-01-31 LAB — PHOSPHORUS: PHOSPHORUS: 3.4 mg/dL (ref 2.5–4.6)

## 2017-01-31 MED ORDER — TRACE MINERALS CR-CU-MN-SE-ZN 10-1000-500-60 MCG/ML IV SOLN
INTRAVENOUS | Status: AC
  Administered 2017-01-31: 18:00:00 via INTRAVENOUS
  Filled 2017-01-31: qty 1992

## 2017-01-31 MED ORDER — FAT EMULSION 20 % IV EMUL
240.0000 mL | INTRAVENOUS | Status: AC
Start: 2017-01-31 — End: 2017-02-01
  Administered 2017-01-31: 240 mL via INTRAVENOUS
  Filled 2017-01-31: qty 240

## 2017-01-31 NOTE — Progress Notes (Signed)
[place] NOTE   Pharmacy Consult for short-term trial of TPN for 7 days. Indication: unable to tolerate PO intake  Patient Measurements: Body mass index is 22.73 kg/m. Filed Weights   01/20/17 0428 01/21/17 0610 01/28/17 0410  Weight: 146 lb 13.2 oz (66.6 kg) 149 lb 11.2 oz (67.9 kg) 149 lb 7.6 oz (67.8 kg)  Ideal Body Weight:  70 kg  HPI:  47 yoM admitted on 1/17 with c/o G-tube leaking blood and gastric contents around exit site causing erosion of surrounding skin.  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  IR converted to larger G-J tube on 1/15, but it has continued to leak.  On 1/19 the [place] tube was permanently removed without complications.  He continues to have leakage through the non-healing tube site and is therefore unable to meet nutrition goals with oral diet. Pharmacy is consulted for trial of short-term TPN to provide nutrition and avoid oral intake that is exacerbating the healing process around his feeding tube site.  Significant events: 1/28 consumed water with leakage at previous G tube site - feeding tube site has not healed>>to continue TPN  Insulin requirements past 24 hours: 23 units   Current Nutrition: NPO   IVF: NS at 10 ml/hr  Central access: Implanted port (05/21/16) TPN start date: 01/22/17  ASSESSMENT                                                                                                          Today:   Glucose:  goal range < 150.  CBGs remain elevated with insulin 40 units/24 hr (142-215))  - decadron to decrease to 1 mg/day 1/29  Prednisone 10mg  daily from home and 1/22-1/23, changed to Dexamethasone IV 2mg /day on 1/23; dose reduced to 1 mg qday 1/29..  Likely contributing to hyperglycemia  Electrolytes:  Na, K+, Phos WNL. Magnesium 1.9,  Monitor for refeeding syndrome.  Renal:  Stable, Scr  wnl  LFTs:  Low/stable  TGs:  91 (1/23) 50 (1/28)  Prealbumin:  <5 (1/23), 14.5 (1/28)   NUTRITIONAL GOALS                                                                                             RD recs (1/28):  110-120 Protein, 2350-2550 Kcal  Clinimix 5/20 at a goal rate of 83 ml/hr + 20% fat emulsion 240 ml/day to provide: 100 g of protein and 2233 kCals per day meeting 91 % of protein and 95 % of kCal needs  - Glucose infusion rate will be 4.07 mg/kg/min (Maximum 5 mg/kg/min)  PLAN                                                                                                           At 1800 today:  Continue Clinimix E 5/20 at 83 ml/hr goal rate to provide 2233 kcal and ~ 100 gm protein (95% kcal goal and ~ 90 % protein goal)  TPN to contain standard multivitamins and trace element daily.  Regular insulin in TPN - patient to receive 40 units / 24 hrs.   20% fat emulsion at 20 ml/hr x12 hours/day  IVF at 10 ml/hr.  continue CBGs and resistant SSI q4h.   TPN lab panels on Mondays & Thursdays.  BMET, Mag level in am  Monitor for re-feeding syndrome & plans to initiate oral diet  Minda Ditto PharmD Pager [phone] 01/31/2017, 10:26 AM

## 2017-01-31 NOTE — Progress Notes (Signed)
PROGRESS NOTE    Andres Smith  RFF:[id number] DOB: 05-Mar-1940 DOA: 01/17/2017 PCP: No primary care provider on file.   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 G-tube was removed.  Abdominal wall cellulitis around the previous G tube site  was being treated with antibiotics.  Currently he is on TPN as he is unable to tolerate by mouth because when he starts eating, there is leakage of gastric contents  from the previous G tube site.  Hoping for closure of the G-tube site so that patient can be started on oral feeding.  Assessment & Plan:   G-tube malfunction with abdominal cellulitis - S/P G-tube removal by IR. Cultures were negative.He finished the antibiotics course . - Continue wound care around the area of the cellulitis - Wound care following.  Continue PPI - Hoping for the healing of the leak and starting on clear liquid diet soon - On 01/28/17 ,we  checked the leakage from previous G tube site  at the bedside.  He was given a glass of water through mouth and immediately after 1-2 minutes the water started flowing from his previous G-tube site. 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 Alvy Bimler .  Not a candidate for chemotherapy now.Was on Gtube which has been removed. - onc following   History of recent CVA - Continue plavix   Coronary artery disease - Status post CABG - Continue plavix   Hypertension, essential - Stable   Crohn's disease - Steroids ordered by oncology.  Continue supportive care.  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  No intubation.  CPR okay Family Communication: no family at the bedside Disposition Plan: home once he feels better    Consultants:   IR, oncology  Procedures:   TPN  Antimicrobials:  Ceftriaxone since 01/20/17-01/29/17  Vancomycin 01/17/17-01/20/17   Subjective: No overnight events.  Objective: Vitals:   01/30/17 0435 01/30/17 1334 01/30/17 2040 01/31/17 0450  BP: (!) 105/58 95/62 112/65 110/64  Pulse: 76 (!) 104 76 (!) 109  Resp: 20 16 16 16   Temp: 98.1 F (36.7 C) 98.2 F (36.8 C) 97.8 F (36.6 C) 99.2 F (37.3 C)  TempSrc: Oral Oral Oral Oral  SpO2: 98% 98% 99% 98%  Weight:      Height:        Intake/Output Summary (Last 24 hours) at 01/31/2017 1006 Last data filed at 01/31/2017 0856 Gross per 24 hour  Intake 987 ml  Output 1025 ml  Net -38 ml   Filed Weights   01/20/17 0428 01/21/17 0610 01/28/17 0410  Weight: 66.6 kg (146 lb 13.2 oz) 67.9 kg (149 lb 11.2 oz) 67.8 kg (149 lb 7.6 oz)   Physical Exam  Constitutional: Appears well-developed and well-nourished. No distress.  CVS: RRR, S1/S2 + Pulmonary: Effort and breath sounds  normal, no stridor, rhonchi, wheezes, rales.  Abdominal: Soft. BS +, G tube (+) Musculoskeletal: Normal range of motion. No edema and no tenderness.  Lymphadenopathy: No lymphadenopathy noted, cervical, inguinal. Neuro: Alert. Normal reflexes, muscle tone coordination. No cranial nerve deficit. Skin: Skin is warm and dry.  Psychiatric: Normal mood and affect.     Data Reviewed: I have personally reviewed following labs and imaging studies  CBC: Recent Labs  Lab 01/25/17 0735 01/28/17 0446 01/29/17 0430 01/30/17 0807  01/31/17 0439  WBC 4.9 6.5 6.3 6.5 6.8  NEUTROABS 3.5 5.1 4.7 4.7  --   HGB 8.0* 7.6* 7.6* 8.7* 8.6*  HCT 23.7* 22.6* 22.5* 25.9* 26.4*  MCV 93.7 93.8 94.1 92.8 93.6  PLT 150 158 158 140* 468   Basic Metabolic Panel: Recent Labs  Lab 01/26/17 0611 01/27/17 0532 01/28/17 0446 01/29/17 0430 01/30/17 0807 01/31/17 0439  NA 133* 137 136 136 137 135  K 3.3* 4.7 3.6 3.7 4.1 3.9  CL 98* 102 102 101 101 100*  CO2 31 29 29 29  32 30  GLUCOSE 127* 171* 131* 177* 163* 176*  BUN 15 15 18  22* 24* 25*  CREATININE 0.70 0.62 0.63 0.67 0.75 0.67  CALCIUM 8.5* 8.9 8.8* 8.9 8.8* 8.8*  MG 1.7 1.6* 2.0 1.6* 1.8 1.9  PHOS 3.3 3.0 3.2 3.5  --  3.4   GFR: Estimated Creatinine Clearance: 74.2 mL/min (by C-G formula based on SCr of 0.67 mg/dL). Liver Function Tests: Recent Labs  Lab 01/28/17 0446 01/31/17 0439  AST 26 31  ALT 14* 22  ALKPHOS 57 75  BILITOT 0.2* 0.6  PROT 5.1* 5.4*  ALBUMIN 2.3* 2.5*   No results for input(s): LIPASE, AMYLASE in the last 168 hours. No results for input(s): AMMONIA in the last 168 hours. Coagulation Profile: No results for input(s): INR, PROTIME in the last 168 hours. Cardiac Enzymes: No results for input(s): CKTOTAL, CKMB, CKMBINDEX, TROPONINI in the last 168 hours. BNP (last 3 results) No results for input(s): PROBNP in the last 8760 hours. HbA1C: No results for input(s): HGBA1C in the last 72 hours. CBG: Recent Labs  Lab 01/30/17 1702 01/30/17 2043 01/31/17 0019 01/31/17 0445 01/31/17 0752  GLUCAP 215* 162* 142* 135* 147*   Lipid Profile: No results for input(s): CHOL, HDL, LDLCALC, TRIG, CHOLHDL, LDLDIRECT in the last 72 hours. Thyroid Function Tests: No results for input(s): TSH, T4TOTAL, FREET4, T3FREE, THYROIDAB in the last 72 hours. Anemia Panel: No results for input(s): VITAMINB12, FOLATE, FERRITIN, TIBC, IRON, RETICCTPCT in the last 72 hours. Sepsis Labs: No results for input(s): PROCALCITON, LATICACIDVEN in the last 168  hours.  No results found for this or any previous visit (from the past 240 hour(s)).       Radiology Studies: No results found.      Scheduled Meds: . clopidogrel  75 mg Oral Daily  . dexamethasone  1 mg Intravenous Q24H  . insulin aspart  0-20 Units Subcutaneous Q4H  . pantoprazole (PROTONIX) IV  40 mg Intravenous Q24H  . Zinc Oxide   Topical Q4H   Continuous Infusions: . Marland KitchenTPN (CLINIMIX-E) Adult 83 mL/hr at 01/30/17 1820  . sodium chloride 10 mL/hr at 01/30/17 0410  . famotidine (PEPCID) IV Stopped (01/30/17 2150)     LOS: 14 days    Time spent: 25 mins    Leisa Lenz, MD [facility] Pager [phone]  If 7PM-7AM, please contact night-coverage [email or web address] Password [email or web address] 01/31/2017, 10:06 AM

## 2017-01-31 NOTE — Care Management Important Message (Signed)
Important Message  Patient Details  Name: DEMANI MCBRIEN MRN: [id number] Date of Birth: 03/31/1940   Medicare Important Message Given:  Yes    Kerin Salen 01/31/2017, 11:49 AMImportant Message  Patient Details  Name: FRANS VALENTE MRN: [id number] Date of Birth: 12/22/1940   Medicare Important Message Given:  Yes    Kerin Salen 01/31/2017, 11:49 AM

## 2017-01-31 NOTE — Progress Notes (Signed)
CM and [facility] continue to follow along to assist with disposition. Unsure if pt would be able to go home on home TPN with close outpt follow up. Pt was approved for [place] program (High Risk Readmission) which means that [place] would come to pt home daily for first 30 days. CM will continue to follow. Marney Doctor RN,BSN,NCM [phone]

## 2017-01-31 NOTE — Progress Notes (Signed)
I stop by to check on him.  He is doing well.  We will continue to follow.

## 2017-02-01 ENCOUNTER — Encounter: Payer: Self-pay | Admitting: Internal Medicine

## 2017-02-01 DIAGNOSIS — L89151 Pressure ulcer of sacral region, stage 1: Secondary | ICD-10-CM

## 2017-02-01 LAB — GLUCOSE, CAPILLARY
GLUCOSE-CAPILLARY: 168 mg/dL — AB (ref 65–99)
GLUCOSE-CAPILLARY: 176 mg/dL — AB (ref 65–99)
GLUCOSE-CAPILLARY: 96 mg/dL (ref 65–99)
Glucose-Capillary: 145 mg/dL — ABNORMAL HIGH (ref 65–99)
Glucose-Capillary: 199 mg/dL — ABNORMAL HIGH (ref 65–99)
Glucose-Capillary: 191 mg/dL — ABNORMAL HIGH (ref 65–99)

## 2017-02-01 LAB — BASIC METABOLIC PANEL
Anion gap: 7 (ref 5–15)
BUN: 27 mg/dL — ABNORMAL HIGH (ref 6–20)
CHLORIDE: 101 mmol/L (ref 101–111)
CO2: 29 mmol/L (ref 22–32)
CREATININE: 0.69 mg/dL (ref 0.61–1.24)
Calcium: 8.9 mg/dL (ref 8.9–10.3)
GFR calc non Af Amer: >60 mL/min (ref >60)
Glucose, Bld: 147 mg/dL — ABNORMAL HIGH (ref 65–99)
Potassium: 3.7 mmol/L (ref 3.5–5.1)
Sodium: 137 mmol/L (ref 135–145)

## 2017-02-01 LAB — MAGNESIUM: Magnesium: 1.6 mg/dL — ABNORMAL LOW (ref 1.7–2.4)

## 2017-02-01 MED ORDER — FAT EMULSION 20 % IV EMUL
240.0000 mL | INTRAVENOUS | Status: AC
  Administered 2017-02-01: 240 mL via INTRAVENOUS
  Filled 2017-02-01: qty 250

## 2017-02-01 MED ORDER — POTASSIUM CHLORIDE 10 MEQ/100ML IV SOLN
10.0000 meq | INTRAVENOUS | Status: AC
  Administered 2017-02-01: 10 meq via INTRAVENOUS
  Filled 2017-02-01 (×2): qty 100

## 2017-02-01 MED ORDER — INSULIN REGULAR HUMAN 100 UNIT/ML IJ SOLN
INTRAMUSCULAR | Status: AC
  Administered 2017-02-01: 18:00:00 via INTRAVENOUS
  Filled 2017-02-01: qty 1992

## 2017-02-01 MED ORDER — MAGNESIUM SULFATE 2 GM/50ML IV SOLN
2.0000 g | Freq: Once | INTRAVENOUS | Status: AC
Start: 2017-02-01 — End: 2017-02-01
  Administered 2017-02-01: 2 g via INTRAVENOUS
  Filled 2017-02-01: qty 50

## 2017-02-01 NOTE — Progress Notes (Signed)
PROGRESS NOTE    Andres Smith  ION:[id number] DOB: [birth year] DOA: 01/17/2017 PCP: No primary care provider on file.   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 G-tube was removed.  Abdominal wall cellulitis around the previous G tube site  was being treated with antibiotics.  Currently he is on TPN as he is unable to tolerate by mouth because when he starts eating, there is leakage of gastric contents  from the previous G tube site.  Hoping for closure of the G-tube site so that patient can be started on oral feeding.  Assessment & Plan:   G-tube malfunction with abdominal cellulitis - S/P G-tube removal by IR. Cultures were negative.He finished the antibiotics course . - Continue wound care around the area of the cellulitis - Wound care following.  Continue PPI - Hoping for the healing of the leak and starting on clear liquid diet soon - On 01/28/17 ,we  checked the leakage from previous G tube site  at the bedside.  He was given a glass of water through mouth and immediately after 1-2 minutes the water started flowing from his previous G-tube site. 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 Alvy Bimler .  Not a candidate for chemotherapy now.Was on Gtube which has been removed.  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  No intubation.  CPR okay Family Communication: wife at bedsde Disposition Plan: home once cleared by onc    Consultants:   IR, oncology  Procedures:   TPN  Antimicrobials:  Ceftriaxone since 01/20/17-01/29/17  Vancomycin 01/17/17-01/20/17   Subjective: No overnight events.   Objective: Vitals:   01/31/17 0450 01/31/17 1533 01/31/17 2106 02/01/17 0523  BP: 110/64 117/60 106/60 (!) 90/51  Pulse: (!) 109 (!) 112 (!) 105 64  Resp: 16 16 16 16   Temp: 99.2 F (37.3 C) 97.6 F (36.4 C) (!) 97.5 F (36.4 C) 97.9 F (36.6 C)  TempSrc: Oral Oral Oral Oral  SpO2: 98% 99% 99% 100%  Weight:      Height:        Intake/Output Summary (Last 24 hours) at 02/01/2017 1117 Last data filed at 02/01/2017 0825 Gross per 24 hour  Intake 1153.53 ml  Output 700 ml  Net 453.53 ml   Filed Weights   01/20/17 0428 01/21/17 0610 01/28/17 0410  Weight: 66.6 kg (146 lb 13.2 oz) 67.9 kg (149 lb 11.2 oz) 67.8 kg (149 lb 7.6 oz)   Physical Exam  Constitutional: Appears well-developed and well-nourished. No distress.  CVS: RRR, S1/S2 + Pulmonary:  Effort and breath sounds normal, no stridor, rhonchi, wheezes, rales.  Abdominal: (+) G tube, (+) BS Musculoskeletal: No edema and no tenderness.  Lymphadenopathy: No lymphadenopathy noted, cervical, inguinal. Neuro: Alert. No cranial nerve deficit. Skin: Skin is warm and dry.  Psychiatric: Normal mood and affect.    Data Reviewed: I have personally reviewed following labs and imaging studies  CBC: Recent Labs  Lab 01/28/17 0446 01/29/17 0430 01/30/17 0807 01/31/17 0439  WBC 6.5 6.3 6.5 6.8  NEUTROABS 5.1 4.7 4.7  --   HGB 7.6* 7.6* 8.7* 8.6*    HCT 22.6* 22.5* 25.9* 26.4*  MCV 93.8 94.1 92.8 93.6  PLT 158 158 140* 595   Basic Metabolic Panel: Recent Labs  Lab 01/26/17 0611 01/27/17 0532 01/28/17 0446 01/29/17 0430 01/30/17 0807 01/31/17 0439 02/01/17 0500  NA 133* 137 136 136 137 135 137  K 3.3* 4.7 3.6 3.7 4.1 3.9 3.7  CL 98* 102 102 101 101 100* 101  CO2 31 29 29 29  32 30 29  GLUCOSE 127* 171* 131* 177* 163* 176* 147*  BUN 15 15 18  22* 24* 25* 27*  CREATININE 0.70 0.62 0.63 0.67 0.75 0.67 0.69  CALCIUM 8.5* 8.9 8.8* 8.9 8.8* 8.8* 8.9  MG 1.7 1.6* 2.0 1.6* 1.8 1.9 1.6*  PHOS 3.3 3.0 3.2 3.5  --  3.4  --    GFR: Estimated Creatinine Clearance: 74.2 mL/min (by C-G formula based on SCr of 0.69 mg/dL). Liver Function Tests: Recent Labs  Lab 01/28/17 0446 01/31/17 0439  AST 26 31  ALT 14* 22  ALKPHOS 57 75  BILITOT 0.2* 0.6  PROT 5.1* 5.4*  ALBUMIN 2.3* 2.5*   No results for input(s): LIPASE, AMYLASE in the last 168 hours. No results for input(s): AMMONIA in the last 168 hours. Coagulation Profile: No results for input(s): INR, PROTIME in the last 168 hours. Cardiac Enzymes: No results for input(s): CKTOTAL, CKMB, CKMBINDEX, TROPONINI in the last 168 hours. BNP (last 3 results) No results for input(s): PROBNP in the last 8760 hours. HbA1C: No results for input(s): HGBA1C in the last 72 hours. CBG: Recent Labs  Lab 01/31/17 1720 01/31/17 2037 02/01/17 0014 02/01/17 0448 02/01/17 0731  GLUCAP 146* 148* 176* 145* 96   Lipid Profile: No results for input(s): CHOL, HDL, LDLCALC, TRIG, CHOLHDL, LDLDIRECT in the last 72 hours. Thyroid Function Tests: No results for input(s): TSH, T4TOTAL, FREET4, T3FREE, THYROIDAB in the last 72 hours. Anemia Panel: No results for input(s): VITAMINB12, FOLATE, FERRITIN, TIBC, IRON, RETICCTPCT in the last 72 hours. Sepsis Labs: No results for input(s): PROCALCITON, LATICACIDVEN in the last 168 hours.  No results found for this or any previous visit (from the past  240 hour(s)).       Radiology Studies: No results found.      Scheduled Meds: . clopidogrel  75 mg Oral Daily  . dexamethasone  1 mg Intravenous Q24H  . insulin aspart  0-20 Units Subcutaneous Q4H  . pantoprazole (PROTONIX) IV  40 mg Intravenous Q24H  . Zinc Oxide   Topical Q4H   Continuous Infusions: . Marland KitchenTPN (CLINIMIX-E) Adult 83 mL/hr at 01/31/17 1816  . sodium chloride 10 mL/hr at 01/30/17 0410  . famotidine (PEPCID) IV Stopped (01/31/17 2159)     LOS: 15 days    Time spent: 25 minutes     Leisa Lenz, MD [facility] Pager [phone]  If 7PM-7AM, please contact night-coverage [email or web address] Password [email or web address] 02/01/2017, 11:17 AM

## 2017-02-01 NOTE — Progress Notes (Signed)
Andres Smith   DOB:[birth year]   ZO#:[id number]    Subjective: He is seen with his wife.  At the time of evaluation, the nursing staff is changing the dressing around the PEG tube site.  It appears to be healing well and the hole is smaller.  He denies significant pain.  I did not reviewed the site of the decubitus ulcer but according to his nurse, it is a stage I and slowly healing.  He feels weak overall and is uncertain about the plan to go home with home TPN.  Objective:  Vitals:   01/31/17 2106 02/01/17 0523  BP: 106/60 (!) 90/51  Pulse: (!) 105 64  Resp: 16 16  Temp: (!) 97.5 F (36.4 C) 97.9 F (36.6 C)  SpO2: 99% 100%     Intake/Output Summary (Last 24 hours) at 02/01/2017 1631 Last data filed at 02/01/2017 0825 Gross per 24 hour  Intake 223.53 ml  Output 700 ml  Net -476.47 ml    GENERAL:alert, no distress and comfortable SKIN: I have examined the site of his feeding tube.  It did appear to be healing slowly. 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 We will continue supportive care  Acquired pancytopenia [facility]) He had received 1 unit of blood transfusion on January 29, 2017.  Repeat CBC shows stable hemoglobin  Cancer associated pain The patient has intermittent rib pain We will give him intermittent IV morphine as needed  Dysphagia, leaking around feeding  tube Dysphagia is stable He was started on TPN on January 22, 2017 Since the leak is not healed yet but overall improving, he will continue TPN  Slow wound healing around feeding tube site& GERD Appreciate care from wound care nurse I think that is a component of reflux that exacerbate leakage of gastric content He was startedon proton pump inhibitor in the morning and Pepcid at night since 1/23 with symptomatic improvement With aggressive wound care, I agree that the site is healing slowly but in the right direction  Severe protein calorie malnutrition He is on TPN We will watch out for refeeding syndrome He will continue electrolyte replacement therapy as needed  Sacral decubitus ulcer I encouraged the patient to walk around as much as possible  Chronic history of Crohn's disease He is on low-dose prednisone in the outpatient clinic I will switch him to low-dose IV dexamethasone in the short-term, plan to reduce the dose to 1 mg IV starting today  Profound weakness I have encouraged ambulation over the past week I did not see formal PT evaluation In my opinion, given how weak he is, with recent diagnosis of stroke and the need for blood transfusion, it is not clear to me that he will be strong enough to go home.  I am concerned about patient's safety I recommend  formal PT evaluation to determine whether he can go home on home TPN or whether he would need to be transferred to a skilled nursing facility that would also administer TPN   Goals of care Overall he is improvingbut is still dependent on TPN. Last Monday, he had a trial of oral intake and was noticed to have significant leak through his feeding tube site Since then, the area is healing well  If he is not discharged this weekend, my plan would be to repeat a trial of oral water intake on 02/04/17, Monday to see if the feeding tube site has healed adequately to allow oral intake.  If so, we can wean him off  TPN  Discharge planning He is not ready to be discharged, maybe next week  [name], MD 02/01/2017  4:31 PM

## 2017-02-01 NOTE — Progress Notes (Signed)
Andres Smith NOTE   Pharmacy Consult for short-term trial of TPN for 7 days. Indication: unable to tolerate PO intake  Patient Measurements: Body mass index is 22.73 kg/m. Filed Weights   01/20/17 0428 01/21/17 0610 01/28/17 0410  Weight: 146 lb 13.2 oz (66.6 kg) 149 lb 11.2 oz (67.9 kg) 149 lb 7.6 oz (67.8 kg)  Ideal Body Weight:  70 kg  HPI:  72 yoM admitted on 1/17 with c/o G-tube leaking blood and gastric contents around exit site causing erosion of surrounding skin.  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  IR converted to larger G-J tube on 1/15, but it has continued to leak.  On 1/19 the [place] tube was permanently removed without complications.  He continues to have leakage through the non-healing tube site and is therefore unable to meet nutrition goals with oral diet. Pharmacy is consulted for trial of short-term TPN to provide nutrition and avoid oral intake that is exacerbating the healing process around his feeding tube site.  Significant events: 1/28 consumed water with leakage at previous G tube site - feeding tube site has not healed>>to continue TPN  Insulin requirements past 24 hours: 16 units   Current Nutrition: NPO   IVF: NS at 10 ml/hr  Central access: Implanted port (05/21/16) TPN start date: 01/22/17  ASSESSMENT                                                                                                          Today:   Glucose:  goal range < 150.  CBGs slightly elevated but overall controlled with insulin 40 units/24 hr (96-176))  - decadron to decrease to 1 mg/day 1/29  Prednisone 10mg  daily from home and 1/22-1/23, changed to Dexamethasone IV 2mg /day on 1/23; dose reduced to 1 mg qday 1/29..  Likely contributing to hyperglycemia  Electrolytes:  Na, Phos WNL. Magnesium 1.6, Potassium 3.7,  Monitor for refeeding  syndrome.  Renal:  Stable, Scr wnl  LFTs:  Low/stable  TGs:  91 (1/23) 60 (1/28)  Prealbumin:  <5 (1/23), 14.5 (1/28)   NUTRITIONAL GOALS                                                                                             RD recs (1/28):  110-120 Protein, 2350-2550 Kcal  Clinimix 5/20 at a goal rate of 83 ml/hr + 20% fat emulsion 240 ml/day to provide: 100 g of protein and 2233 kCals per day meeting 91 % of protein and 95 % of kCal needs  - Glucose infusion rate will be 4.07  mg/kg/min (Maximum 5 mg/kg/min)   PLAN                                                                                                          Magnesium sulfate 2 gr IV x1  Potassium chloride 10 meq IV x2   At 1800 today:  Continue Clinimix E 5/20 at 83 ml/hr goal rate to provide 2233 kcal and ~ 100 gm protein (95% kcal goal and ~ 90 % protein goal)  TPN to contain standard multivitamins and trace element daily.  Regular insulin in TPN - patient to receive 40 units / 24 hrs.   20% fat emulsion at 20 ml/hr x12 hours/day  IVF at 10 ml/hr.  continue CBGs and resistant SSI q4h.   TPN lab panels on Mondays & Thursdays.  BMET, Mag level in am  Monitor for re-feeding syndrome & plans to initiate oral diet   Royetta Asal, PharmD, BCPS Pager [phone] 02/01/2017 12:06 PM

## 2017-02-02 LAB — CBC
HCT: 25.8 % — ABNORMAL LOW (ref 39.0–52.0)
Hemoglobin: 8.7 g/dL — ABNORMAL LOW (ref 13.0–17.0)
MCH: 31.4 pg (ref 26.0–34.0)
MCHC: 33.7 g/dL (ref 30.0–36.0)
MCV: 93.1 fL (ref 78.0–100.0)
Platelets: 146 10*3/uL — ABNORMAL LOW (ref 150–400)
RBC: 2.77 MIL/uL — AB (ref 4.22–5.81)
RDW: 15.8 % — ABNORMAL HIGH (ref 11.5–15.5)
WBC: 7.7 10*3/uL (ref 4.0–10.5)

## 2017-02-02 LAB — GLUCOSE, CAPILLARY
GLUCOSE-CAPILLARY: 119 mg/dL — AB (ref 65–99)
GLUCOSE-CAPILLARY: 148 mg/dL — AB (ref 65–99)
GLUCOSE-CAPILLARY: 161 mg/dL — AB (ref 65–99)
GLUCOSE-CAPILLARY: 189 mg/dL — AB (ref 65–99)
GLUCOSE-CAPILLARY: 90 mg/dL (ref 65–99)
Glucose-Capillary: 218 mg/dL — ABNORMAL HIGH (ref 65–99)
Glucose-Capillary: 145 mg/dL — ABNORMAL HIGH (ref 65–99)

## 2017-02-02 LAB — BASIC METABOLIC PANEL
Anion gap: 6 (ref 5–15)
BUN: 26 mg/dL — ABNORMAL HIGH (ref 6–20)
CHLORIDE: 101 mmol/L (ref 101–111)
CO2: 29 mmol/L (ref 22–32)
Calcium: 8.9 mg/dL (ref 8.9–10.3)
Creatinine, Ser: 0.67 mg/dL (ref 0.61–1.24)
GFR calc non Af Amer: >60 mL/min (ref >60)
Glucose, Bld: 128 mg/dL — ABNORMAL HIGH (ref 65–99)
POTASSIUM: 4 mmol/L (ref 3.5–5.1)
SODIUM: 136 mmol/L (ref 135–145)

## 2017-02-02 LAB — MAGNESIUM: MAGNESIUM: 1.7 mg/dL (ref 1.7–2.4)

## 2017-02-02 LAB — PHOSPHORUS: PHOSPHORUS: 3.7 mg/dL (ref 2.5–4.6)

## 2017-02-02 MED ORDER — TRACE MINERALS CR-CU-MN-SE-ZN 10-1000-500-60 MCG/ML IV SOLN
INTRAVENOUS | Status: AC
  Administered 2017-02-02: 19:00:00 via INTRAVENOUS
  Filled 2017-02-02: qty 1992

## 2017-02-02 MED ORDER — FAT EMULSION 20 % IV EMUL
240.0000 mL | INTRAVENOUS | Status: AC
  Administered 2017-02-02: 240 mL via INTRAVENOUS
  Filled 2017-02-02: qty 250

## 2017-02-02 NOTE — Progress Notes (Signed)
PT Cancellation Note  Patient Details Name: Andres Smith MRN: [id number] DOB: 09/19/1940   Cancelled Treatment:    Reason Eval/Treat Not Completed: PT screened, no needs identified, will sign off  RN reports that patient is ambulating multiple times without difficulty.    Claretha Cooper 02/02/2017, 8:39 AM Tresa Endo PT [phone]

## 2017-02-02 NOTE — Progress Notes (Signed)
PROGRESS NOTE    Andres TALLERICO  UYQ:[id number] DOB: [birth year] DOA: 01/17/2017 PCP: No primary care provider on file.   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 G-tube was removed.  Abdominal wall cellulitis around the previous G tube site  was being treated with antibiotics.  Currently he is on TPN as he is unable to tolerate by mouth because when he starts eating, there is leakage of gastric contents  from the previous G tube site.  Hoping for closure of the G-tube site so that patient can be started on oral feeding.  Assessment & Plan:   G-tube malfunction with abdominal cellulitis - S/P G-tube removal by IR. Cultures were negative.He finished the antibiotics course . - Continue wound care around the area of the cellulitis - Wound care following.  Continue PPI - Hoping for the healing of the leak and starting on clear liquid diet soon - On 01/28/17 ,we  checked the leakage from previous G tube site  at the bedside.  He was given a glass of water through mouth and immediately after 1-2 minutes the water started flowing from his previous G-tube site. 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 Alvy Bimler .  Not a candidate for chemotherapy now.Was on Gtube which has been removed.  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  No intubation.  CPR okay Family Communication: no family at bedside  Disposition Plan: home once cleared by onc   Consultants:   IR, oncology  Procedures:   TPN  Antimicrobials:  Ceftriaxone since 01/20/17-01/29/17  Vancomycin 01/17/17-01/20/17   Subjective: No overnight events.  Objective: Vitals:   02/01/17 0523 02/01/17 1658 02/01/17 2025 02/02/17 0404  BP: (!) 90/51 (!) 109/50 135/75 104/63  Pulse: 64 70 72 70  Resp: 16 18 18 20   Temp: 97.9 F (36.6 C) 97.6 F (36.4 C) 97.7 F (36.5 C) 98 F (36.7 C)  TempSrc: Oral Oral Oral Oral  SpO2: 100% 100% 99% 100%  Weight:      Height:        Intake/Output Summary (Last 24 hours) at 02/02/2017 1227 Last data filed at 02/01/2017 2145 Gross per 24 hour  Intake -  Output 250 ml  Net -250 ml   Filed Weights   01/20/17 0428 01/21/17 0610 01/28/17 0410  Weight: 66.6 kg (146 lb 13.2 oz) 67.9 kg (149 lb 11.2 oz) 67.8 kg (149 lb 7.6 oz)   Physical Exam  Constitutional: Appears well-developed and well-nourished. No distress.   CVS: Rate controlled, S1/S2 + Pulmonary: Effort and breath sounds normal, no stridor, rhonchi, wheezes, rales.  Abdominal: Soft. BS +,  (+ )G tube  Musculoskeletal: Normal range of motion. No edema and no tenderness.  Lymphadenopathy: No lymphadenopathy noted, cervical, inguinal. Neuro: Alert. No cranial nerve deficit. Skin: Skin is warm and dry. Psychiatric: Normal mood and affect.  Data Reviewed: I have personally reviewed following labs and imaging studies  CBC: Recent Labs  Lab 01/28/17 0446 01/29/17 0430 01/30/17 0807 01/31/17 0439 02/02/17 0426  WBC 6.5 6.3 6.5 6.8 7.7  NEUTROABS 5.1 4.7 4.7  --   --   HGB 7.6* 7.6* 8.7* 8.6* 8.7*  HCT 22.6* 22.5* 25.9*  26.4* 25.8*  MCV 93.8 94.1 92.8 93.6 93.1  PLT 158 158 140* 163 867*   Basic Metabolic Panel: Recent Labs  Lab 01/27/17 0532 01/28/17 0446 01/29/17 0430 01/30/17 0807 01/31/17 0439 02/01/17 0500 02/02/17 0426  NA 137 136 136 137 135 137 136  K 4.7 3.6 3.7 4.1 3.9 3.7 4.0  CL 102 102 101 101 100* 101 101  CO2 29 29 29  32 30 29 29   GLUCOSE 171* 131* 177* 163* 176* 147* 128*  BUN 15 18 22* 24* 25* 27* 26*  CREATININE 0.62 0.63 0.67 0.75 0.67 0.69 0.67  CALCIUM 8.9 8.8* 8.9 8.8* 8.8* 8.9 8.9  MG 1.6* 2.0 1.6* 1.8 1.9 1.6* 1.7  PHOS 3.0 3.2 3.5  --  3.4  --  3.7   GFR: Estimated Creatinine Clearance: 74.2 mL/min (by C-G formula based on SCr of 0.67 mg/dL). Liver Function Tests: Recent Labs  Lab 01/28/17 0446 01/31/17 0439  AST 26 31  ALT 14* 22  ALKPHOS 57 75  BILITOT 0.2* 0.6  PROT 5.1* 5.4*  ALBUMIN 2.3* 2.5*   No results for input(s): LIPASE, AMYLASE in the last 168 hours. No results for input(s): AMMONIA in the last 168 hours. Coagulation Profile: No results for input(s): INR, PROTIME in the last 168 hours. Cardiac Enzymes: No results for input(s): CKTOTAL, CKMB, CKMBINDEX, TROPONINI in the last 168 hours. BNP (last 3 results) No results for input(s): PROBNP in the last 8760 hours. HbA1C: No results for input(s): HGBA1C in the last 72 hours. CBG: Recent Labs  Lab 02/01/17 2020 02/02/17 0007 02/02/17 0403 02/02/17 0741 02/02/17 1150  GLUCAP 191* 189* 119* 148* 161*   Lipid Profile: No results for input(s): CHOL, HDL, LDLCALC, TRIG, CHOLHDL, LDLDIRECT in the last 72 hours. Thyroid Function Tests: No results for input(s): TSH, T4TOTAL, FREET4, T3FREE, THYROIDAB in the last 72 hours. Anemia Panel: No results for input(s): VITAMINB12, FOLATE, FERRITIN, TIBC, IRON, RETICCTPCT in the last 72 hours. Sepsis Labs: No results for input(s): PROCALCITON, LATICACIDVEN in the last 168 hours.  No results found for this or any previous visit (from the past 240  hour(s)).       Radiology Studies: No results found.      Scheduled Meds: . clopidogrel  75 mg Oral Daily  . dexamethasone  1 mg Intravenous Q24H  . insulin aspart  0-20 Units Subcutaneous Q4H  . pantoprazole (PROTONIX) IV  40 mg Intravenous Q24H  . Zinc Oxide   Topical Q4H   Continuous Infusions: . Marland KitchenTPN (CLINIMIX-E) Adult 83 mL/hr at 02/01/17 1733  . Marland KitchenTPN (CLINIMIX-E) Adult     And  . fat emulsion    . sodium chloride 10 mL/hr at 01/30/17 0410  . famotidine (PEPCID) IV Stopped (02/01/17 2214)     LOS: 16 days    Time spent: 25 minutes    Leisa Lenz, MD [facility] Pager [phone]  If 7PM-7AM, please contact night-coverage [email or web address] Password [email or web address] 02/02/2017, 12:27 PM

## 2017-02-02 NOTE — Progress Notes (Signed)
[place] NOTE   Pharmacy Consult for TPN Indication: unable to tolerate PO intake d/t PEG tube site leakage  Patient Measurements: Body mass index is 22.73 kg/m. Filed Weights   01/20/17 0428 01/21/17 0610 01/28/17 0410  Weight: 146 lb 13.2 oz (66.6 kg) 149 lb 11.2 oz (67.9 kg) 149 lb 7.6 oz (67.8 kg)  Ideal Body Weight:  70 kg  HPI:  89 yoM admitted on 1/17 with c/o G-tube leaking blood and gastric contents around exit site causing erosion of surrounding skin.  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  IR converted to larger G-J tube on 1/15, but it has continued to leak.  On 1/19 the [place] tube was permanently removed without complications.  He continues to have leakage through the non-healing tube site and is therefore unable to meet nutrition goals with oral diet. Pharmacy is consulted for trial of short-term TPN to provide nutrition and avoid oral intake that is exacerbating the healing process around his feeding tube site.  Significant events:  1/28 consumed water with leakage at previous G tube site - feeding tube site has not healed, but is improving.  To continue TPN. 2/4 Plan to repeat trial of oral water intake to determine if feeding tube site is healed and able to wean off TPN.  Insulin requirements past 24 hours: 11 units SSI - PTA:  Metformin 500mg  BID, chronic prednisone 5mg  - Started dexamethasone IV while NPO.  Reduced from 2mg  (1/23-1/29) to 1mg  (1/29 - ... )  Current Nutrition: NPO   IVF: NS at 10 ml/hr  Central access: Implanted port (05/21/16) TPN start date: 01/22/17  ASSESSMENT                                                                                                          Today:   Glucose:  goal range < 150.  CBGs slightly elevated but overall controlled (range 119-191) with SSI and TPN insulin 40 units/24 hr.   Also appears to be improving after dexamethasone dose reduction.  PTA:  Metformin 500mg  BID, chronic prednisone 5mg   Started dexamethasone IV while NPO.  Reduced from 2mg  (1/23-1/29) to 1mg  (1/29 -   )  Electrolytes:  WNL  Renal:  Stable, Scr wnl  LFTs:  Low/stable  TGs:  91 (1/23) 60 (1/28)  Prealbumin:  <5 (1/23), 14.5 (1/28)   NUTRITIONAL GOALS                                                                                             RD recs (1/28):  110-120 Protein,  2350-2550 Kcal  Clinimix 5/20 at a goal rate of 83 ml/hr + 20% fat emulsion 240 ml/day to provide: 100 g of protein and 2233 kCals per day meeting 91 % of protein and 95 % of kCal needs  - Glucose infusion rate will be 4.07 mg/kg/min (Maximum 5 mg/kg/min)   PLAN                                                                                                           At 1800 today:  Continue Clinimix E 5/20 at 83 ml/hr (goal rate)  TPN to contain standard multivitamins and trace element daily.  Regular insulin in TPN - patient to receive 40 units / 24 hrs.   20% fat emulsion at 20 ml/hr x12 hours/day  IVF at 10 ml/hr.  Continue CBGs and resistant SSI q4h.   TPN lab panels on Mondays & Thursdays.  BMET, Mag level in am  Follow up plans for oral trial on 02/04/17 and/or home [place] PharmD, BCPS Pager [phone] 02/02/2017 11:40 AM

## 2017-02-03 LAB — CBC
HCT: 25.6 % — ABNORMAL LOW (ref 39.0–52.0)
HEMOGLOBIN: 8.4 g/dL — AB (ref 13.0–17.0)
MCH: 30.9 pg (ref 26.0–34.0)
MCHC: 32.8 g/dL (ref 30.0–36.0)
MCV: 94.1 fL (ref 78.0–100.0)
PLATELETS: 150 10*3/uL (ref 150–400)
RBC: 2.72 MIL/uL — ABNORMAL LOW (ref 4.22–5.81)
RDW: 15.7 % — ABNORMAL HIGH (ref 11.5–15.5)
WBC: 6.9 10*3/uL (ref 4.0–10.5)

## 2017-02-03 LAB — BASIC METABOLIC PANEL
Anion gap: 7 (ref 5–15)
BUN: 25 mg/dL — AB (ref 6–20)
CHLORIDE: 98 mmol/L — AB (ref 101–111)
CO2: 26 mmol/L (ref 22–32)
CREATININE: 0.59 mg/dL — AB (ref 0.61–1.24)
Calcium: 8.5 mg/dL — ABNORMAL LOW (ref 8.9–10.3)
GFR calc Af Amer: >60 mL/min (ref >60)
GFR calc non Af Amer: >60 mL/min (ref >60)
GLUCOSE: 148 mg/dL — AB (ref 65–99)
Potassium: 3.7 mmol/L (ref 3.5–5.1)
SODIUM: 131 mmol/L — AB (ref 135–145)

## 2017-02-03 LAB — GLUCOSE, CAPILLARY
GLUCOSE-CAPILLARY: 166 mg/dL — AB (ref 65–99)
GLUCOSE-CAPILLARY: 122 mg/dL — AB (ref 65–99)
Glucose-Capillary: 108 mg/dL — ABNORMAL HIGH (ref 65–99)
Glucose-Capillary: 124 mg/dL — ABNORMAL HIGH (ref 65–99)
Glucose-Capillary: 137 mg/dL — ABNORMAL HIGH (ref 65–99)
Glucose-Capillary: 240 mg/dL — ABNORMAL HIGH (ref 65–99)

## 2017-02-03 MED ORDER — FAT EMULSION 20 % IV EMUL
240.0000 mL | INTRAVENOUS | Status: AC
  Administered 2017-02-03: 240 mL via INTRAVENOUS
  Filled 2017-02-03: qty 250

## 2017-02-03 MED ORDER — TRACE MINERALS CR-CU-MN-SE-ZN 10-1000-500-60 MCG/ML IV SOLN
INTRAVENOUS | Status: AC
  Administered 2017-02-03: 18:00:00 via INTRAVENOUS
  Filled 2017-02-03: qty 1992

## 2017-02-03 MED ORDER — NYSTATIN 100000 UNIT/ML MT SUSP
5.0000 mL | Freq: Four times a day (QID) | OROMUCOSAL | Status: DC
  Administered 2017-02-03 – 2017-02-12 (×29): 500000 [IU] via ORAL
  Filled 2017-02-03 (×33): qty 5

## 2017-02-03 NOTE — Progress Notes (Signed)
[place] NOTE   Pharmacy Consult for TPN Indication: unable to tolerate PO intake d/t PEG tube site leakage  Patient Measurements: Body mass index is 22.73 kg/m. Filed Weights   01/20/17 0428 01/21/17 0610 01/28/17 0410  Weight: 146 lb 13.2 oz (66.6 kg) 149 lb 11.2 oz (67.9 kg) 149 lb 7.6 oz (67.8 kg)  Ideal Body Weight:  70 kg  HPI:  52 yoM admitted on 1/17 with c/o G-tube leaking blood and gastric contents around exit site causing erosion of surrounding skin.  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  IR converted to larger G-J tube on 1/15, but it has continued to leak.  On 1/19 the [place] tube was permanently removed without complications.  He continues to have leakage through the non-healing tube site and is therefore unable to meet nutrition goals with oral diet. Pharmacy is consulted for trial of short-term TPN to provide nutrition and avoid oral intake that is exacerbating the healing process around his feeding tube site.  Significant events:  1/28 consumed water with leakage at previous G tube site - feeding tube site has not healed, but is improving.  To continue TPN. 2/4 Plan to repeat trial of oral water intake to determine if feeding tube site is healed and able to wean off TPN.  Insulin requirements past 24 hours: 21 units SSI - PTA metformin 500mg  BID, chronic prednisone 5mg .  Currently on Dexamethasone IV.  Current Nutrition: NPO   IVF: NS at 10 ml/hr  Central access: Implanted port (05/21/16) TPN start date: 01/22/17  ASSESSMENT                                                                                                          Today:   Glucose:  goal range < 150.  CBGs slightly elevated but overall controlled (range 90-218) with SSI and TPN insulin.  Monitor after dexamethasone dose reduction.  PTA:  Metformin 500mg  BID,  chronic prednisone 5mg   Started dexamethasone IV while NPO.  Reduced from 2mg  (1/23-1/29) to 1mg  (1/29 -   )  Electrolytes:  Na low, others WNL  Renal:  Stable, Scr wnl  LFTs:  Low/stable  TGs:  91 (1/23) 60 (1/28)  Prealbumin:  <5 (1/23), 14.5 (1/28)   NUTRITIONAL GOALS                                                                                             RD recs (1/28):  110-120 Protein, 2350-2550 Kcal  Clinimix 5/20 at a goal rate of 83 ml/hr + 20% fat emulsion 240  ml/day to provide: 100 g of protein and 2233 kCals per day meeting 91% of protein and 95% of kCal needs  - Glucose infusion rate will be 4.07 mg/kg/min (Maximum 5 mg/kg/min)   PLAN                                                                                                           At 1800 today:  Continue Clinimix E 5/20 at 83 ml/hr (goal rate)  TPN to contain standard multivitamins and trace element daily.  Continue 40 units regular insulin in TPN / 24 hrs.   Not adding more of SSI used into TPN due to overnight low CBG 90 near midnight.  20% fat emulsion at 20 ml/hr x12 hours/day  IVF at 10 ml/hr.  Continue CBGs and resistant SSI q4h.   TPN lab panels on Mondays & Thursdays.  Follow up plans for oral trial on 02/04/17 and/or home [place] PharmD, BCPS Pager [phone] 02/03/2017 10:52 AM

## 2017-02-03 NOTE — Progress Notes (Signed)
PROGRESS NOTE    Andres Smith  LYY:[id number] DOB: 08-07-1940 DOA: 01/17/2017 PCP: No primary care provider on file.   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 G-tube was removed.  Abdominal wall cellulitis around the previous G tube site  was being treated with antibiotics.  Currently he is on TPN as he is unable to tolerate by mouth because when he starts eating, there is leakage of gastric contents  from the previous G tube site.  Hoping for closure of the G-tube site so that patient can be started on oral feeding.  Assessment & Plan:   G-tube malfunction with abdominal cellulitis - S/P G-tube removal by IR. Cultures were negative.He finished the antibiotics course . - Continue wound care around the area of the cellulitis - Wound care following.  Continue PPI - Hoping for the healing of the leak and starting on clear liquid diet soon - On 01/28/17 ,we  checked the leakage from previous G tube site  at the bedside.  He was given a glass of water through mouth and immediately after 1-2 minutes the water started flowing from his previous G-tube site. 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 Alvy Bimler .  Not a candidate for chemotherapy now.Was on Gtube which has been removed.  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  No intubation.  CPR okay Family Communication: wife at bedside Disposition Plan: home once cleared by onc   Consultants:   IR, oncology  Procedures:   TPN  Antimicrobials:  Ceftriaxone since 01/20/17-01/29/17  Vancomycin 01/17/17-01/20/17   Subjective: No overnight events.  Objective: Vitals:   02/02/17 0404 02/02/17 1408 02/02/17 2009 02/03/17 0351  BP: 104/63 (!) 160/58 119/72 113/65  Pulse: 70 70 76 66  Resp: 20 18 16 14   Temp: 98 F (36.7 C) 98.1 F (36.7 C) 98 F (36.7 C) 98.4 F (36.9 C)  TempSrc: Oral Oral Oral Oral  SpO2: 100% 98% 99% 98%  Weight:      Height:        Intake/Output Summary (Last 24 hours) at 02/03/2017 1351 Last data filed at 02/03/2017 0351 Gross per 24 hour  Intake -  Output 700 ml  Net -700 ml   Filed Weights   01/20/17 0428 01/21/17 0610 01/28/17 0410  Weight: 66.6 kg (146 lb 13.2 oz) 67.9 kg (149 lb 11.2 oz) 67.8 kg (149 lb 7.6 oz)   Physical Exam  Constitutional: Appears well-developed and well-nourished. No distress.  CVS: RRR, S1/S2 +, no murmurs, no gallops, no carotid bruit.  Pulmonary: Effort and breath sounds normal, no stridor, rhonchi, wheezes, rales.  Abdominal: Soft. BS +,  (+) G tube Musculoskeletal: Normal range of motion. No edema and no tenderness.  Lymphadenopathy: No lymphadenopathy noted, cervical, inguinal. Neuro: Alert. Normal reflexes, muscle tone coordination. No cranial nerve deficit. Skin: Skin is warm and dry.  Psychiatric: Normal mood and affect. Behavior, judgment, thought content normal.    Data Reviewed: I have personally reviewed following labs and imaging studies  CBC: Recent Labs  Lab 01/28/17 0446 01/29/17 0430 01/30/17 0807 01/31/17 0439  02/02/17 0426 02/03/17 0445  WBC 6.5 6.3 6.5 6.8 7.7 6.9  NEUTROABS 5.1 4.7 4.7  --   --   --   HGB 7.6* 7.6* 8.7* 8.6* 8.7* 8.4*  HCT 22.6* 22.5* 25.9* 26.4* 25.8* 25.6*  MCV 93.8 94.1 92.8 93.6 93.1 94.1  PLT 158 158 140* 163 146* 242   Basic Metabolic Panel: Recent Labs  Lab 01/28/17 0446 01/29/17 0430 01/30/17 0807 01/31/17 0439 02/01/17 0500 02/02/17 0426 02/03/17 0445  NA 136 136 137 135 137 136 131*  K 3.6 3.7 4.1 3.9 3.7 4.0 3.7  CL 102 101 101 100* 101 101 98*  CO2 29 29 32 30 29 29 26   GLUCOSE 131* 177* 163* 176* 147* 128* 148*  BUN 18 22* 24* 25* 27* 26* 25*  CREATININE 0.63 0.67 0.75 0.67 0.69 0.67 0.59*  CALCIUM 8.8* 8.9 8.8* 8.8* 8.9 8.9 8.5*  MG 2.0 1.6* 1.8 1.9 1.6* 1.7  --   PHOS 3.2 3.5  --  3.4  --  3.7  --    GFR: Estimated Creatinine Clearance: 74.2 mL/min (A) (by C-G formula based on SCr of 0.59 mg/dL (L)). Liver Function Tests: Recent Labs  Lab 01/28/17 0446 01/31/17 0439  AST 26 31  ALT 14* 22  ALKPHOS 57 75  BILITOT 0.2* 0.6  PROT 5.1* 5.4*  ALBUMIN 2.3* 2.5*   No results for input(s): LIPASE, AMYLASE in the last 168 hours. No results for input(s): AMMONIA in the last 168 hours. Coagulation Profile: No results for input(s): INR, PROTIME in the last 168 hours. Cardiac Enzymes: No results for input(s): CKTOTAL, CKMB, CKMBINDEX, TROPONINI in the last 168 hours. BNP (last 3 results) No results for input(s): PROBNP in the last 8760 hours. HbA1C: No results for input(s): HGBA1C in the last 72 hours. CBG: Recent Labs  Lab 02/02/17 2008 02/02/17 2339 02/03/17 0350 02/03/17 0750 02/03/17 1151  GLUCAP 145* 90 166* 137* 108*   Lipid Profile: No results for input(s): CHOL, HDL, LDLCALC, TRIG, CHOLHDL, LDLDIRECT in the last 72 hours. Thyroid Function Tests: No results for input(s): TSH, T4TOTAL, FREET4, T3FREE, THYROIDAB in the last 72 hours. Anemia Panel: No results for input(s): VITAMINB12, FOLATE, FERRITIN, TIBC, IRON, RETICCTPCT in  the last 72 hours. Sepsis Labs: No results for input(s): PROCALCITON, LATICACIDVEN in the last 168 hours.  No results found for this or any previous visit (from the past 240 hour(s)).       Radiology Studies: No results found.      Scheduled Meds: . clopidogrel  75 mg Oral Daily  . dexamethasone  1 mg Intravenous Q24H  . insulin aspart  0-20 Units Subcutaneous Q4H  . pantoprazole (PROTONIX) IV  40 mg Intravenous Q24H  . Zinc Oxide   Topical Q4H   Continuous Infusions: . Marland KitchenTPN (CLINIMIX-E) Adult 83 mL/hr at 02/02/17 1849  . Marland KitchenTPN (CLINIMIX-E) Adult     And  . fat emulsion    . sodium chloride 10 mL/hr at 01/30/17 0410  . famotidine (PEPCID) IV Stopped (02/02/17  2125)     LOS: 17 days    Time spent: 25 minutes    Leisa Lenz, MD [facility] Pager [phone]  If 7PM-7AM, please contact night-coverage [email or web address] Password [email or web address] 02/03/2017, 1:51 PM

## 2017-02-04 DIAGNOSIS — B370 Candidal stomatitis: Secondary | ICD-10-CM

## 2017-02-04 LAB — MAGNESIUM: MAGNESIUM: 1.6 mg/dL — AB (ref 1.7–2.4)

## 2017-02-04 LAB — COMPREHENSIVE METABOLIC PANEL
ALK PHOS: 140 U/L — AB (ref 38–126)
ALT: 46 U/L (ref 17–63)
AST: 41 U/L (ref 15–41)
Albumin: 2.4 g/dL — ABNORMAL LOW (ref 3.5–5.0)
Anion gap: 6 (ref 5–15)
BILIRUBIN TOTAL: 0.4 mg/dL (ref 0.3–1.2)
BUN: 28 mg/dL — AB (ref 6–20)
CALCIUM: 8.9 mg/dL (ref 8.9–10.3)
CHLORIDE: 100 mmol/L — AB (ref 101–111)
CO2: 29 mmol/L (ref 22–32)
CREATININE: 0.68 mg/dL (ref 0.61–1.24)
GFR calc Af Amer: >60 mL/min (ref >60)
Glucose, Bld: 122 mg/dL — ABNORMAL HIGH (ref 65–99)
Potassium: 3.8 mmol/L (ref 3.5–5.1)
Sodium: 135 mmol/L (ref 135–145)
TOTAL PROTEIN: 5.5 g/dL — AB (ref 6.5–8.1)

## 2017-02-04 LAB — GLUCOSE, CAPILLARY
GLUCOSE-CAPILLARY: 211 mg/dL — AB (ref 65–99)
Glucose-Capillary: 137 mg/dL — ABNORMAL HIGH (ref 65–99)
Glucose-Capillary: 130 mg/dL — ABNORMAL HIGH (ref 65–99)
Glucose-Capillary: 127 mg/dL — ABNORMAL HIGH (ref 65–99)
Glucose-Capillary: 126 mg/dL — ABNORMAL HIGH (ref 65–99)

## 2017-02-04 LAB — DIFFERENTIAL
Basophils Absolute: 0 10*3/uL (ref 0.0–0.1)
Basophils Relative: 0 %
EOS ABS: 0.1 10*3/uL (ref 0.0–0.7)
EOS PCT: 1 %
LYMPHS ABS: 1.1 10*3/uL (ref 0.7–4.0)
Lymphocytes Relative: 15 %
Monocytes Absolute: 0.6 10*3/uL (ref 0.1–1.0)
Monocytes Relative: 8 %
NEUTROS PCT: 76 %
Neutro Abs: 5.9 10*3/uL (ref 1.7–7.7)

## 2017-02-04 LAB — CBC
HCT: 25.4 % — ABNORMAL LOW (ref 39.0–52.0)
Hemoglobin: 8.7 g/dL — ABNORMAL LOW (ref 13.0–17.0)
MCH: 31.8 pg (ref 26.0–34.0)
MCHC: 34.3 g/dL (ref 30.0–36.0)
MCV: 92.7 fL (ref 78.0–100.0)
Platelets: 139 10*3/uL — ABNORMAL LOW (ref 150–400)
RBC: 2.74 MIL/uL — ABNORMAL LOW (ref 4.22–5.81)
RDW: 15.9 % — AB (ref 11.5–15.5)
WBC: 7.7 10*3/uL (ref 4.0–10.5)

## 2017-02-04 LAB — PHOSPHORUS: Phosphorus: 4 mg/dL (ref 2.5–4.6)

## 2017-02-04 LAB — PREALBUMIN: PREALBUMIN: 13.5 mg/dL — AB (ref 18–38)

## 2017-02-04 LAB — TRIGLYCERIDES: Triglycerides: 42 mg/dL (ref <150)

## 2017-02-04 MED ORDER — FAT EMULSION 20 % IV EMUL
240.0000 mL | INTRAVENOUS | Status: AC
  Administered 2017-02-04: 240 mL via INTRAVENOUS
  Filled 2017-02-04: qty 250

## 2017-02-04 MED ORDER — MAGNESIUM SULFATE 2 GM/50ML IV SOLN
2.0000 g | Freq: Once | INTRAVENOUS | Status: AC
  Administered 2017-02-04: 2 g via INTRAVENOUS
  Filled 2017-02-04: qty 50

## 2017-02-04 MED ORDER — TRACE MINERALS CR-CU-MN-SE-ZN 10-1000-500-60 MCG/ML IV SOLN
INTRAVENOUS | Status: AC
  Administered 2017-02-04: 19:00:00 via INTRAVENOUS
  Filled 2017-02-04: qty 1992

## 2017-02-04 NOTE — Progress Notes (Signed)
PT Cancellation Note  Patient Details Name: Andres Smith MRN: [id number] DOB: 09/20/1940   Cancelled Treatment:    Reason Eval/Treat Not Completed: Other (comment); patient in the middle of wound care and other nursing needs.  Will attempt later today.   Reginia Naas 02/04/2017, 10:57 AM  Magda Kiel, [place] 02/04/2017

## 2017-02-04 NOTE — Progress Notes (Signed)
Nutrition Follow-up  DOCUMENTATION CODES:   Severe malnutrition in context of chronic illness  INTERVENTION:   TPN per Pharmacy Will monitor for diet advancement  NUTRITION DIAGNOSIS:   Severe Malnutrition related to chronic illness, cancer and cancer related treatments as evidenced by percent weight loss, moderate fat depletion, severe fat depletion, severe muscle depletion.  Ongoing.  GOAL:   Patient will meet greater than or equal to 90% of their needs  Meeting with TPN.  MONITOR:   Weight trends, Labs, Skin(TPN), diet advancement  ASSESSMENT:   Pt with PMH significant for buccal mucosa squamous cell carcinoma (chemo/radiation), dysphagia, s/p PEG placement May 2018, CAD, DM, HLD, and HTN. Presents this admission with PEG tube malfunction. Surgery notes pt had PEG tube upsize from 8F to 22F 1/15, but it continues to leak around the site.   Pt continues to be NPO. Per MD note, pt will trial clears today to see if he can tolerate. TPN will continue at goal rate: Clinimix 5/20 at a goal rate of 83 ml/hr + 20% fat emulsion 240 ml/day.  Will monitor for diet advancement and will discuss supplement options at that time.  Medications: IV Decadron Labs reviewed:  CBGs: 127-130 Low Mg  Diet Order:  Diet NPO time specified .TPN (CLINIMIX-E) Adult TPN (CLINIMIX-E) Adult  EDUCATION NEEDS:   Education needs have been addressed  Skin:  Skin Assessment: Skin Integrity Issues: Skin Integrity Issues:: Other (Comment), Stage II Stage II: sacrum Other: non-pressure abdominal wound  Last BM:  2/2  Height:   Ht Readings from Last 1 Encounters:  01/17/17 5\' 8"  (1.727 m)    Weight:   Wt Readings from Last 1 Encounters:  02/04/17 149 lb 11.1 oz (67.9 kg)    Ideal Body Weight:  70 kg  BMI:  Body mass index is 22.76 kg/m.  Estimated Nutritional Needs:   Kcal:  9326-7124 kcal/day  Protein:  110-120 g/day  Fluid:  >2.3 L/day  Clayton Bibles, MS, RD, LDN [place] [facility] Pager: [phone] After Hours Pager: [phone]

## 2017-02-04 NOTE — Progress Notes (Signed)
Andres Smith   DOB:02-26-1940   XL#:[id number]    Subjective: According to the patient and family, he is able to ambulate in the hallway but his wife stated the patient could not negotiate any stairs.  The skin surrounding the PEG tube site is healing well.  He has developed some oral thrush.  I recommend trial of oral fluid intake and observed water leakage within 30 seconds through the feeding tube site  Objective:  Vitals:   02/04/17 0359 02/04/17 1335  BP: 124/62 114/60  Pulse: 74 77  Resp: 12 14  Temp: 98.2 F (36.8 C) 98.3 F (36.8 C)  SpO2: 100% 98%     Intake/Output Summary (Last 24 hours) at 02/04/2017 1656 Last data filed at 02/04/2017 0359 Gross per 24 hour  Intake -  Output 700 ml  Net -700 ml    GENERAL:alert, no distress and comfortable SKIN: The skin surrounding the feeding tube is healing.  Barrier cream is noted EYES: normal, Conjunctiva are pink and non-injected, sclera clear OROPHARYNX: Noted oral thrush ABDOMEN:abdomen soft, noted water leakage through the feeding tube after he drink water Musculoskeletal:no cyanosis of digits and no clubbing  NEURO: alert & oriented x 3 with fluent speech, no focal motor/sensory deficits   Labs:  Lab Results  Component Value Date   WBC 7.7 02/04/2017   HGB 8.7 (L) 02/04/2017   HCT 25.4 (L) 02/04/2017   MCV 92.7 02/04/2017   PLT 139 (L) 02/04/2017   NEUTROABS 5.9 02/04/2017    Lab Results  Component Value Date   NA 135 02/04/2017   K 3.8 02/04/2017   CL 100 (L) 02/04/2017   CO2 29 02/04/2017   Assessment & Plan:  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 We will continue supportive care  Acquired pancytopenia [facility]) He had received 1 unit of blood transfusion on January 29, 2017.Repeat CBC shows stable  hemoglobin  Oral thrush Could be due to malnutrition and chronic steroid therapy. He is on nystatin suspension  Cancer associated pain The patient has intermittent rib pain We will give him intermittent IV morphine as needed  Dysphagia, leaking around feeding tube Dysphagia is stable He was started on TPN on January 22, 2017 Since the leak is not healed yet but overall improving, he will continue TPN  Slow wound healing around feeding tube site& GERD Appreciate care from wound care nurse I think that is a component of reflux that exacerbate leakage of gastric content He was startedon proton pump inhibitor in the morning and Pepcid at night since 1/23 with symptomatic improvement With aggressive wound care, I agree that the site is healing slowly I recommend general surgery consultation to see if surgical intervention or other suggestions are available to help promote healing around the feeding tube  Severe protein calorie malnutrition He is on TPN We will watch out for refeeding syndrome He will continue electrolyte replacement therapy as needed  Sacral decubitus ulcer I encouraged the patient to walk around as much as possible  Chronic history of Inflammatory bowel disease He is on low-dose prednisone in the outpatient clinic I will switch him to low-dose IV dexamethasone in the short-term, plan to reduce the dose to 1 mg IV starting today  Profound weakness His wife felt that the patient is too weak to go home.  The patient has  stairs and that has not been worked on by physical therapy.   Goals of care Overall he is improvingbut is still dependent on TPN. Last Monday, he had a trial of oral intake and was noticed to have significant leak through his feeding tube site Since then, the area is healing well  but not adequate. Recommend general surgery consultation and evaluation  Discharge planning He is not ready to be discharged, maybe next week  Andres Lark,  MD 02/04/2017  4:56 PM

## 2017-02-04 NOTE — Progress Notes (Signed)
[facility] NOTE   Pharmacy Consult for TPN Indication: unable to tolerate PO intake d/t PEG tube site leakage  Patient Measurements: Body mass index is 22.76 kg/m. Filed Weights   01/21/17 0610 01/28/17 0410 02/04/17 0359  Weight: 149 lb 11.2 oz (67.9 kg) 149 lb 7.6 oz (67.8 kg) 149 lb 11.1 oz (67.9 kg)  Ideal Body Weight:  70 kg  HPI:  1 yoM admitted on 1/17 with c/o G-tube leaking blood and gastric contents around exit site causing erosion of surrounding skin.  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  IR converted to larger G-J tube on 1/15, but it has continued to leak.  On 1/19 the [place] tube was permanently removed without complications.  He continues to have leakage through the non-healing tube site and is therefore unable to meet nutrition goals with oral diet. Pharmacy is consulted for trial of short-term TPN to provide nutrition and avoid oral intake that is exacerbating the healing process around his feeding tube site.  Significant events:  1/28 consumed water with leakage at previous G tube site - feeding tube site has not healed, but is improving.  To continue TPN. 2/4 Plan to repeat trial of oral water intake to determine if feeding tube site is healed and able to wean off TPN.  Insulin requirements past 24 hours: 19 units SSI - PTA metformin 500mg  BID, chronic prednisone 5mg .  Currently on Dexamethasone IV.  Current Nutrition: NPO   IVF: NS at 10 ml/hr  Central access: Implanted port (05/21/16) TPN start date: 01/22/17  ASSESSMENT                                                                                                          Today:   Glucose:  goal range < 150.  CBGs slightly elevated but overall controlled (range 108-240) with SSI and TPN insulin.  Monitor after dexamethasone dose reduction.  PTA:  Metformin 500mg  BID,  chronic prednisone 5mg   Started dexamethasone IV while NPO.  Reduced from 2mg  (1/23-1/29) to 1mg  (1/29 -   )  Electrolytes:  Mg low, others WNL  Renal:  Stable, Scr wnl  LFTs:  Low/stable  TGs:  91 (1/23) 60 (1/28) 42 (2/4)  Prealbumin:  <5 (1/23), 14.5 (1/28)   NUTRITIONAL GOALS                                                                                             RD recs (1/28):  110-120 Protein, 2350-2550 Kcal  Clinimix 5/20 at a goal rate of 83 ml/hr + 20% fat  emulsion 240 ml/day to provide: 100 g of protein and 2233 kCals per day meeting 91% of protein and 95% of kCal needs  - Glucose infusion rate will be 4.07 mg/kg/min (Maximum 5 mg/kg/min)   PLAN                                                                                                          Magnesium Sulfate 2gm IV x 1 At 1800 today:  Continue Clinimix E 5/20 at 83 ml/hr (goal rate)  TPN to contain standard multivitamins and trace element daily.  Continue 40 units regular insulin in TPN / 24 hrs.   20% fat emulsion at 20 ml/hr x12 hours/day  IVF at 10 ml/hr.  Continue CBGs and resistant SSI q4h.   TPN lab panels on Mondays & Thursdays.  Follow up plans for oral trial on 02/04/17 and/or home [place] 02/04/2017, 10:36 AM Pager [phone]

## 2017-02-04 NOTE — Progress Notes (Signed)
PROGRESS NOTE    Andres Smith  WPY:[id number] DOB: 02-10-1940 DOA: 01/17/2017 PCP: No primary care provider on file.   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 G-tube was removed.  Abdominal wall cellulitis around the previous G tube site  was being treated with antibiotics.  Currently he is on TPN as he is unable to tolerate by mouth because when he starts eating, there is leakage of gastric contents  from the previous G tube site.  Hoping for closure of the G-tube site so that patient can be started on oral feeding.  Assessment & Plan:   G-tube malfunction with abdominal cellulitis - S/P G-tube removal by IR. Cultures were negative.He finished the antibiotics course . - Continue wound care around the area of the cellulitis - Wound care following.  Continue PPI - Hoping for the healing of the leak and starting on clear liquid diet soon - On 01/28/17 ,we  checked the leakage from previous G tube site  at the bedside.  He was given a glass of water through mouth and immediately after 1-2 minutes the water started flowing from his previous G-tube site. - Pt is still on TPN< plan to start little of clear liquid to see if he tolerates it   Moderate to severe protein calorie malnutrition/failure to thrive - On TPN  Buccal mucosa Squamous cell carcinoma with dysphagia - Follows with oncology Dr. Alvy Bimler .  Not a candidate for chemotherapy now.Was on Gtube which has been removed.  History of recent CVA - Continue Plavix   Coronary artery disease - Status post CABG - Continue Plavix   Hypertension, essential - Controlled   Crohn's disease - Continue decadron 4 mg IV Q 24 hours   Hypokalemia/hypomagnesemia - Supplemented and  WNL  Pancytopenia - Secondary to malignancy - S/P 1 U PRBC transfusion 01/30/2016  - WBC is WNL, Hgb is 8.7 and platelets are 139  Generalized weakness - PT has seen the pt in consultation, HH PT ordered   Hyperglycemia - Continue SSI  Stage 1 pressure ulcer - Supportive care   DVT prophylaxis: SCD's Code Status: Partial code.  No intubation.  CPR okay Family Communication: wife at bedside  Disposition Plan: home once cleared by oncology    Consultants:   IR, oncology  Procedures:   TPN  Antimicrobials:  Ceftriaxone since 01/20/17-01/29/17  Vancomycin 01/17/17-01/20/17   Subjective: No overnight events.   Objective: Vitals:   02/03/17 0351 02/03/17 1453 02/03/17 1955 02/04/17 0359  BP: 113/65 109/64 117/71 124/62  Pulse: 66 88 65 74  Resp: 14 16 14 12   Temp: 98.4 F (36.9 C) 97.7 F (36.5 C) 98 F (36.7 C) 98.2 F (36.8 C)  TempSrc: Oral Oral Oral Oral  SpO2: 98% 100% 100% 100%  Weight:    67.9 kg (149 lb 11.1 oz)  Height:        Intake/Output Summary (Last 24 hours) at 02/04/2017 1030 Last data filed at 02/04/2017 0359 Gross per 24 hour  Intake -  Output 700 ml  Net -700 ml   Filed Weights   01/21/17 0610 01/28/17 0410 02/04/17 0359  Weight: 67.9 kg (149 lb 11.2 oz) 67.8 kg (149 lb 7.6 oz) 67.9 kg (149 lb 11.1 oz)   Physical Exam  Constitutional: Appears well-developed and  well-nourished. No distress.  CVS: RRR, S1/S2 + Pulmonary: Effort and breath sounds normal, no stridor, rhonchi, wheezes, rales.  Abdominal: Soft. BS +,  no distension, (+) G tube Musculoskeletal: Normal range of motion. No edema and no tenderness.  Lymphadenopathy: No lymphadenopathy noted, cervical, inguinal. Neuro: Alert. Normal reflexes, muscle tone coordination. No cranial nerve deficit. Skin: Skin is warm and dry.  Psychiatric: Normal mood and affect. Behavior, judgment, thought content normal.    Data Reviewed: I have personally reviewed following labs and imaging  studies  CBC: Recent Labs  Lab 01/29/17 0430 01/30/17 0807 01/31/17 0439 02/02/17 0426 02/03/17 0445 02/04/17 0652  WBC 6.3 6.5 6.8 7.7 6.9 7.7  NEUTROABS 4.7 4.7  --   --   --  5.9  HGB 7.6* 8.7* 8.6* 8.7* 8.4* 8.7*  HCT 22.5* 25.9* 26.4* 25.8* 25.6* 25.4*  MCV 94.1 92.8 93.6 93.1 94.1 92.7  PLT 158 140* 163 146* 150 932*   Basic Metabolic Panel: Recent Labs  Lab 01/29/17 0430 01/30/17 0807 01/31/17 0439 02/01/17 0500 02/02/17 0426 02/03/17 0445 02/04/17 0652  NA 136 137 135 137 136 131* 135  K 3.7 4.1 3.9 3.7 4.0 3.7 3.8  CL 101 101 100* 101 101 98* 100*  CO2 29 32 30 29 29 26 29   GLUCOSE 177* 163* 176* 147* 128* 148* 122*  BUN 22* 24* 25* 27* 26* 25* 28*  CREATININE 0.67 0.75 0.67 0.69 0.67 0.59* 0.68  CALCIUM 8.9 8.8* 8.8* 8.9 8.9 8.5* 8.9  MG 1.6* 1.8 1.9 1.6* 1.7  --  1.6*  PHOS 3.5  --  3.4  --  3.7  --  4.0   GFR: Estimated Creatinine Clearance: 74.3 mL/min (by C-G formula based on SCr of 0.68 mg/dL). Liver Function Tests: Recent Labs  Lab 01/31/17 0439 02/04/17 0652  AST 31 41  ALT 22 46  ALKPHOS 75 140*  BILITOT 0.6 0.4  PROT 5.4* 5.5*  ALBUMIN 2.5* 2.4*   No results for input(s): LIPASE, AMYLASE in the last 168 hours. No results for input(s): AMMONIA in the last 168 hours. Coagulation Profile: No results for input(s): INR, PROTIME in the last 168 hours. Cardiac Enzymes: No results for input(s): CKTOTAL, CKMB, CKMBINDEX, TROPONINI in the last 168 hours. BNP (last 3 results) No results for input(s): PROBNP in the last 8760 hours. HbA1C: No results for input(s): HGBA1C in the last 72 hours. CBG: Recent Labs  Lab 02/03/17 1706 02/03/17 1954 02/04/17 0003 02/04/17 0358 02/04/17 0750  GLUCAP 240* 122* 124* 137* 130*   Lipid Profile: Recent Labs    02/04/17 0642  TRIG 42   Thyroid Function Tests: No results for input(s): TSH, T4TOTAL, FREET4, T3FREE, THYROIDAB in the last 72 hours. Anemia Panel: No results for input(s): VITAMINB12,  FOLATE, FERRITIN, TIBC, IRON, RETICCTPCT in the last 72 hours. Sepsis Labs: No results for input(s): PROCALCITON, LATICACIDVEN in the last 168 hours.  No results found for this or any previous visit (from the past 240 hour(s)).       Radiology Studies: No results found.      Scheduled Meds: . clopidogrel  75 mg Oral Daily  . dexamethasone  1 mg Intravenous Q24H  . insulin aspart  0-20 Units Subcutaneous Q4H  . nystatin  5 mL Oral QID  . pantoprazole (PROTONIX) IV  40 mg Intravenous Q24H  . Zinc Oxide   Topical Q4H   Continuous Infusions: . Marland KitchenTPN (CLINIMIX-E) Adult 83 mL/hr at 02/03/17 1819  . sodium chloride 10 mL/hr at  01/30/17 0410  . famotidine (PEPCID) IV Stopped (02/03/17 2104)  . magnesium sulfate 1 - 4 g bolus IVPB 2 g (02/04/17 1019)     LOS: 18 days    Time spent: 25 minutes    Leisa Lenz, MD [facility] Pager [phone]  If 7PM-7AM, please contact night-coverage [email or web address] Password [email or web address] 02/04/2017, 10:30 AM

## 2017-02-04 NOTE — Evaluation (Signed)
Physical Therapy Evaluation Patient Details Name: Andres Smith MRN: [id number] DOB: 13-Aug-1940 Today's Date: 02/04/2017   History of Present Illness  77 year old male with past medical history of buccal mucosa squamous cell cancer, dysphagia with G-tube in place, atrial fibrillation, coronary artery disease status post CABG, recent CVA was admitted for the evaluation of malfunction of his G-tube and possible abdominal cellulitis.  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  High risk for falls due to deficits and will benefit from skilled PT in the acute setting to aide in decreasing fall risk, improving safety and stability and progressing to stair training once IV's can be capped.      Follow Up Recommendations No PT follow up    Equipment Recommendations  None recommended by PT    Recommendations for Other Services       Precautions / Restrictions Precautions Precautions: Fall Precaution Comments: recently tripped on threshold of the door at home and fell      Mobility  Bed Mobility Overal bed mobility: Modified Independent                Transfers Overall transfer level: Modified independent                  Ambulation/Gait Ambulation/Gait assistance: Supervision Ambulation Distance (Feet): 400 Feet Assistive device: None Gait Pattern/deviations: Step-through pattern;Steppage;Decreased stride length;Wide base of support     General Gait Details: no device despite pt wanting to push IV pole, able to stabilize with S level assist for safety, does lift R leg higher due to R foot drop; limited cervical rotation to compensate for R visual field cut  Stairs            Wheelchair Mobility    Modified Rankin (Stroke Patients Only)       Balance Overall balance assessment: Needs assistance   Sitting  balance-Leahy Scale: Good       Standing balance-Leahy Scale: Good                               Pertinent Vitals/Pain Pain Assessment: No/denies pain    Home Living Family/patient expects to be discharged to:: Private residence Living Arrangements: Spouse/significant other;Children Available Help at Discharge: Family;Available 24 hours/day Type of Home: House Home Access: Stairs to enter Entrance Stairs-Rails: None Entrance Stairs-Number of Steps: 2 Home Layout: Two level;Bed/bath upstairs Home Equipment: Shower seat - built in;Cane - single point;Shower seat      Prior Function Level of Independence: Independent         Comments: takes out the trash at home, no longer driving since R visual field cut after stroke 3 months ago     Hand Dominance   Dominant Hand: Right    Extremity/Trunk Assessment   Upper Extremity Assessment Upper Extremity Assessment: Overall WFL for tasks assessed    Lower Extremity Assessment Lower Extremity Assessment: RLE deficits/detail RLE Deficits / Details: AROM WFL, strength hip flexion 4/5, knee extension 4/5, ankle DF 2/5    Cervical / Trunk Assessment Cervical / Trunk Assessment: Other exceptions;Kyphotic Cervical / Trunk Exceptions: ankylosing spondylitis with cervical ROM limitations  Communication   Communication: No difficulties  Cognition Arousal/Alertness: Awake/alert Behavior During Therapy: WFL for tasks assessed/performed Overall Cognitive Status: Within Functional Limits for tasks assessed  General Comments General comments (skin integrity, edema, etc.): Initiated education on fall risk reduction tips including lighting, clear pathway and foot wear.     Exercises Other Exercises Other Exercises: Educated in and pt performed 5 reps sit <> stand no UE support   Assessment/Plan    PT Assessment Patient needs continued PT services  PT Problem List  Decreased strength;Decreased mobility;Decreased safety awareness;Decreased balance;Decreased knowledge of use of DME       PT Treatment Interventions DME instruction;Functional mobility training;Balance training;Patient/family education;Gait training;Therapeutic activities;Stair training;Therapeutic exercise    PT Goals (Current goals can be found in the Care Plan section)  Acute Rehab PT Goals Patient Stated Goal: To go home when able to eat PT Goal Formulation: With patient/family Time For Goal Achievement: 02/11/17 Potential to Achieve Goals: Good    Frequency Min 3X/week   Barriers to discharge        Co-evaluation               AM-PAC PT "6 Clicks" Daily Activity  Outcome Measure Difficulty turning over in bed (including adjusting bedclothes, sheets and blankets)?: None Difficulty moving from lying on back to sitting on the side of the bed? : None Difficulty sitting down on and standing up from a chair with arms (e.g., wheelchair, bedside commode, etc,.)?: None Help needed moving to and from a bed to chair (including a wheelchair)?: A Little Help needed walking in hospital room?: A Little Help needed climbing 3-5 steps with a railing? : A Little 6 Click Score: 21    End of Session Equipment Utilized During Treatment: Gait belt Activity Tolerance: Patient tolerated treatment well Patient left: in bed;with call bell/phone within reach;with family/visitor present   PT Visit Diagnosis: Other abnormalities of gait and mobility (R26.89);History of falling (Z91.81)    Time: 5974-1638 PT Time Calculation (min) (ACUTE ONLY): 24 min   Charges:   PT Evaluation $PT Eval Moderate Complexity: 1 Mod PT Treatments $Gait Training: 8-22 mins   PT G CodesMagda Kiel, [place] [phone] 02/04/2017   Reginia Naas 02/04/2017, 12:25 PM

## 2017-02-05 LAB — CBC
HCT: 25.3 % — ABNORMAL LOW (ref 39.0–52.0)
Hemoglobin: 8.4 g/dL — ABNORMAL LOW (ref 13.0–17.0)
MCH: 31.2 pg (ref 26.0–34.0)
MCHC: 33.2 g/dL (ref 30.0–36.0)
MCV: 94.1 fL (ref 78.0–100.0)
PLATELETS: 143 10*3/uL — AB (ref 150–400)
RBC: 2.69 MIL/uL — ABNORMAL LOW (ref 4.22–5.81)
RDW: 15.6 % — ABNORMAL HIGH (ref 11.5–15.5)
WBC: 7.6 10*3/uL (ref 4.0–10.5)

## 2017-02-05 LAB — MAGNESIUM: MAGNESIUM: 1.8 mg/dL (ref 1.7–2.4)

## 2017-02-05 LAB — BASIC METABOLIC PANEL
Anion gap: 4 — ABNORMAL LOW (ref 5–15)
BUN: 26 mg/dL — AB (ref 6–20)
CALCIUM: 8.9 mg/dL (ref 8.9–10.3)
CO2: 31 mmol/L (ref 22–32)
Chloride: 101 mmol/L (ref 101–111)
Creatinine, Ser: 0.65 mg/dL (ref 0.61–1.24)
GFR calc Af Amer: >60 mL/min (ref >60)
Glucose, Bld: 163 mg/dL — ABNORMAL HIGH (ref 65–99)
Potassium: 3.9 mmol/L (ref 3.5–5.1)
SODIUM: 136 mmol/L (ref 135–145)

## 2017-02-05 LAB — PHOSPHORUS: Phosphorus: 3.5 mg/dL (ref 2.5–4.6)

## 2017-02-05 LAB — GLUCOSE, CAPILLARY
GLUCOSE-CAPILLARY: 176 mg/dL — AB (ref 65–99)
GLUCOSE-CAPILLARY: 127 mg/dL — AB (ref 65–99)
Glucose-Capillary: 179 mg/dL — ABNORMAL HIGH (ref 65–99)
Glucose-Capillary: 203 mg/dL — ABNORMAL HIGH (ref 65–99)
Glucose-Capillary: 154 mg/dL — ABNORMAL HIGH (ref 65–99)

## 2017-02-05 MED ORDER — TRACE MINERALS CR-CU-MN-SE-ZN 10-1000-500-60 MCG/ML IV SOLN
INTRAVENOUS | Status: AC
  Administered 2017-02-05: 18:00:00 via INTRAVENOUS
  Filled 2017-02-05: qty 1992

## 2017-02-05 MED ORDER — FAT EMULSION 20 % IV EMUL
240.0000 mL | INTRAVENOUS | Status: AC
  Administered 2017-02-05: 240 mL via INTRAVENOUS
  Filled 2017-02-05: qty 250

## 2017-02-05 NOTE — Care Management Important Message (Signed)
Important Message  Patient Details  Name: Andres Smith MRN: [id number] Date of Birth: [birth year]   Medicare Important Message Given:  Yes    Kerin Salen 02/05/2017, 11:13 AMImportant Message  Patient Details  Name: Andres Smith MRN: [id number] Date of Birth: October 23, 1940   Medicare Important Message Given:  Yes    Kerin Salen 02/05/2017, 11:13 AM

## 2017-02-05 NOTE — Progress Notes (Signed)
[facility] NOTE   Pharmacy Consult for TPN Indication: unable to tolerate PO intake d/t PEG tube site leakage  Patient Measurements: Body mass index is 22.76 kg/m. Filed Weights   01/21/17 0610 01/28/17 0410 02/04/17 0359  Weight: 149 lb 11.2 oz (67.9 kg) 149 lb 7.6 oz (67.8 kg) 149 lb 11.1 oz (67.9 kg)  Ideal Body Weight:  70 kg  HPI:  7 yoM admitted on 1/17 with c/o G-tube leaking blood and gastric contents around exit site causing erosion of surrounding skin.  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  IR converted to larger G-J tube on 1/15, but it has continued to leak.  On 1/19 the [place] tube was permanently removed without complications.  He continues to have leakage through the non-healing tube site and is therefore unable to meet nutrition goals with oral diet. Pharmacy is consulted for trial of short-term TPN to provide nutrition and avoid oral intake that is exacerbating the healing process around his feeding tube site.  Significant events:  1/28 consumed water with leakage at previous G tube site - feeding tube site has not healed, but is improving.  To continue TPN. 2/4 feeding tube site still leaking  Insulin requirements past 24 hours: 24 units SSI - PTA metformin 500mg  BID, chronic prednisone 5mg .  Currently on Dexamethasone IV.  Current Nutrition: NPO   IVF: NS at 10 ml/hr  Central access: Implanted port (05/21/16) TPN start date: 01/22/17  ASSESSMENT                                                                                                          Today:   Glucose:  goal range < 150.  CBGs slightly elevated but overall controlled (range 122-211) with SSI and TPN insulin.  Monitor after dexamethasone dose reduction.  PTA:  Metformin 500mg  BID, chronic prednisone 5mg   Started dexamethasone IV while NPO.  Reduced from 2mg   (1/23-1/29) to 1mg  (1/29 -   )  Electrolytes:  Mg low end of normal, others WNL  Renal:  Stable, Scr wnl  LFTs:  Low/stable  TGs:  91 (1/23) 60 (1/28) 42 (2/4)  Prealbumin:  <5 (1/23), 14.5 (1/28)   NUTRITIONAL GOALS                                                                                             RD recs (1/28):  110-120 Protein, 2350-2550 Kcal  Clinimix 5/20 at a goal rate of 83 ml/hr + 20% fat emulsion 240 ml/day to provide: 100 g of protein and 2233 kCals per day  meeting 91% of protein and 95% of kCal needs  - Glucose infusion rate will be 4.07 mg/kg/min (Maximum 5 mg/kg/min)   PLAN                                                                                                           At 1800 today:  Continue Clinimix E 5/20 at 83 ml/hr (goal rate)  TPN to contain standard multivitamins and trace element daily.  Continue 40 units regular insulin in TPN / 24 hrs.   20% fat emulsion at 20 ml/hr x12 hours/day  IVF at 10 ml/hr.  Continue CBGs and resistant SSI q4h.   TPN lab panels on Mondays & Thursdays.   Dolly Rias RPh 02/05/2017, 9:25 AM Pager [phone]

## 2017-02-05 NOTE — Progress Notes (Signed)
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    PT Comments    Pt began to ambulate in room and became dizzy after walking ~6', so returned to bed. He reported he felt dizzy when walking in room earlier this morning as well. BP supine 130/68, sitting 88/47 (pt not able to tolerate standing long enough to get BP). RN notified. Will plan to do stair training next session.    Follow Up Recommendations  No PT follow up     Equipment Recommendations  None recommended by PT    Recommendations for Other Services       Precautions / Restrictions Precautions Precautions: Fall Precaution Comments: recently tripped on threshold of the door at home and fell Restrictions Weight Bearing Restrictions: No    Mobility  Bed Mobility Overal bed mobility: Modified Independent                Transfers Overall transfer level: Needs assistance   Transfers: Sit to/from Stand Sit to Stand: Supervision         General transfer comment: close supervision 2* dizziness in standing  Ambulation/Gait Ambulation/Gait assistance: Min guard Ambulation Distance (Feet): 12 Feet Assistive device: None Gait Pattern/deviations: Step-through pattern;Steppage;Decreased stride length;Wide base of support     General Gait Details: distance limited by dizziness, dizziness resolved in sitting, see flowsheets for orthostatic BPs (pt had 42 point drop in systolic from supine to sit)   Stairs            Wheelchair Mobility    Modified Rankin (Stroke Patients Only)       Balance Overall balance assessment:  Needs assistance   Sitting balance-Leahy Scale: Good       Standing balance-Leahy Scale: Fair                              Cognition Arousal/Alertness: Awake/alert Behavior During Therapy: WFL for tasks assessed/performed Overall Cognitive Status: Within Functional Limits for tasks assessed                                        Exercises      General Comments        Pertinent Vitals/Pain Pain Score: 6  Pain Location: mouth Pain Descriptors / Indicators: Sore Pain Intervention(s): Patient requesting pain meds-RN notified    Home Living                      Prior Function            PT Goals (current goals can now be found in the care plan section) Acute Rehab PT Goals Patient Stated Goal: To go home when able to eat PT Goal Formulation: With patient/family Time For Goal Achievement: 02/11/17 Potential to Achieve Goals: Good Progress towards PT goals: Not progressing toward goals - comment(limited by orthostatic hypotension)    Frequency    Min 3X/week      PT Plan Current plan remains appropriate    Co-evaluation  AM-PAC PT "6 Clicks" Daily Activity  Outcome Measure  Difficulty turning over in bed (including adjusting bedclothes, sheets and blankets)?: None Difficulty moving from lying on back to sitting on the side of the bed? : None Difficulty sitting down on and standing up from a chair with arms (e.g., wheelchair, bedside commode, etc,.)?: A Little Help needed moving to and from a bed to chair (including a wheelchair)?: A Little Help needed walking in hospital room?: A Little Help needed climbing 3-5 steps with a railing? : A Lot 6 Click Score: 19    End of Session   Activity Tolerance: Treatment limited secondary to medical complications (Comment)(dizziness) Patient left: in bed;with call bell/phone within reach Nurse Communication: Mobility status;Other (comment)(orthostatic,  dizziness) PT Visit Diagnosis: Other abnormalities of gait and mobility (R26.89);History of falling (Z91.81);Difficulty in walking, not elsewhere classified (R26.2)     Time: 3524-8185 PT Time Calculation (min) (ACUTE ONLY): 15 min  Charges:  $Therapeutic Activity: 8-22 mins                    G Codes:         Andres Smith 02/05/2017, 10:02 AM [phone]

## 2017-02-05 NOTE — Progress Notes (Signed)
PROGRESS NOTE    Andres Smith  BWG:[id number] DOB: 01-Jan-1941 DOA: 01/17/2017 PCP: No primary care provider on file.   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 G-tube was removed.  Abdominal wall cellulitis around the previous G tube site  was being treated with antibiotics.  Currently he is on TPN as he is unable to tolerate by mouth because when he starts eating, there is leakage of gastric contents  from the previous G tube site.  Hoping for closure of the G-tube site so that patient can be started on oral feeding.  Assessment & Plan:   G-tube malfunction with abdominal cellulitis - S/P G-tube removal by IR. Cultures were negative.He finished the antibiotics course . - Continue wound care around the area of the cellulitis - Wound care following.  Continue PPI - Hoping for the healing of the leak and starting on clear liquid diet soon - On 01/28/17 ,we  checked the leakage from previous G tube site  at the bedside.  He was given a glass of water through mouth and immediately after 1-2 minutes the water started flowing from his previous G-tube site. 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 Alvy Bimler .  Not a candidate for chemotherapy now.Was on Gtube which has been removed.  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  No intubation.  CPR okay Family Communication: family not at bedside this am  Disposition Plan: home once cleared by onc    Consultants:   IR, oncology  Procedures:   TPN  Antimicrobials:  Ceftriaxone since 01/20/17-01/29/17  Vancomycin 01/17/17-01/20/17   Subjective: No overnight events.   Objective: Vitals:   02/04/17 1335 02/04/17 2303 02/05/17 0543 02/05/17 0945  BP: 114/60 (!) 134/56 (!) 103/52 130/68  Pulse: 77 74 67 79  Resp: 14 14 18    Temp: 98.3 F (36.8 C) 97.8 F (36.6 C) 98.1 F (36.7 C)   TempSrc: Oral Oral Oral   SpO2: 98% 100% 98% 99%  Weight:      Height:        Intake/Output Summary (Last 24 hours) at 02/05/2017 1044 Last data filed at 02/05/2017 0817 Gross per 24 hour  Intake 0 ml  Output 600 ml  Net -600 ml   Filed Weights   01/21/17 0610 01/28/17 0410 02/04/17 0359  Weight: 67.9 kg (149 lb 11.2 oz) 67.8 kg (149 lb 7.6 oz) 67.9 kg (149 lb 11.1 oz)   Physical  Exam  Constitutional: Appears well-developed and well-nourished. No distress.  CVS: RRR, S1/S2 + Pulmonary: Effort and breath sounds normal, no stridor, rhonchi, wheezes, rales.  Abdominal: Soft. BS +,  no distension, (+) G tube  Musculoskeletal: Normal range of motion. No edema and no tenderness.  Lymphadenopathy: No lymphadenopathy noted, cervical, inguinal. Neuro: Alert. Normal reflexes, muscle tone coordination. No cranial nerve deficit. Skin: Skin is warm and dry. No rash noted.  Psychiatric: Normal mood and affect. Behavior, judgment, thought content normal.   Data Reviewed: I have  personally reviewed following labs and imaging studies  CBC: Recent Labs  Lab 01/30/17 0807 01/31/17 0439 02/02/17 0426 02/03/17 0445 02/04/17 0652 02/05/17 0540  WBC 6.5 6.8 7.7 6.9 7.7 7.6  NEUTROABS 4.7  --   --   --  5.9  --   HGB 8.7* 8.6* 8.7* 8.4* 8.7* 8.4*  HCT 25.9* 26.4* 25.8* 25.6* 25.4* 25.3*  MCV 92.8 93.6 93.1 94.1 92.7 94.1  PLT 140* 163 146* 150 139* 616*   Basic Metabolic Panel: Recent Labs  Lab 01/31/17 0439 02/01/17 0500 02/02/17 0426 02/03/17 0445 02/04/17 0652 02/05/17 0540  NA 135 137 136 131* 135 136  K 3.9 3.7 4.0 3.7 3.8 3.9  CL 100* 101 101 98* 100* 101  CO2 30 29 29 26 29 31   GLUCOSE 176* 147* 128* 148* 122* 163*  BUN 25* 27* 26* 25* 28* 26*  CREATININE 0.67 0.69 0.67 0.59* 0.68 0.65  CALCIUM 8.8* 8.9 8.9 8.5* 8.9 8.9  MG 1.9 1.6* 1.7  --  1.6* 1.8  PHOS 3.4  --  3.7  --  4.0 3.5   GFR: Estimated Creatinine Clearance: 74.3 mL/min (by C-G formula based on SCr of 0.65 mg/dL). Liver Function Tests: Recent Labs  Lab 01/31/17 0439 02/04/17 0652  AST 31 41  ALT 22 46  ALKPHOS 75 140*  BILITOT 0.6 0.4  PROT 5.4* 5.5*  ALBUMIN 2.5* 2.4*   No results for input(s): LIPASE, AMYLASE in the last 168 hours. No results for input(s): AMMONIA in the last 168 hours. Coagulation Profile: No results for input(s): INR, PROTIME in the last 168 hours. Cardiac Enzymes: No results for input(s): CKTOTAL, CKMB, CKMBINDEX, TROPONINI in the last 168 hours. BNP (last 3 results) No results for input(s): PROBNP in the last 8760 hours. HbA1C: No results for input(s): HGBA1C in the last 72 hours. CBG: Recent Labs  Lab 02/04/17 1202 02/04/17 1609 02/04/17 2259 02/05/17 0308 02/05/17 0757  GLUCAP 127* 211* 126* 176* 179*   Lipid Profile: Recent Labs    02/04/17 0642  TRIG 42   Thyroid Function Tests: No results for input(s): TSH, T4TOTAL, FREET4, T3FREE, THYROIDAB in the last 72 hours. Anemia Panel: No results for input(s): VITAMINB12, FOLATE,  FERRITIN, TIBC, IRON, RETICCTPCT in the last 72 hours. Sepsis Labs: No results for input(s): PROCALCITON, LATICACIDVEN in the last 168 hours.  No results found for this or any previous visit (from the past 240 hour(s)).       Radiology Studies: No results found.      Scheduled Meds: . clopidogrel  75 mg Oral Daily  . dexamethasone  1 mg Intravenous Q24H  . insulin aspart  0-20 Units Subcutaneous Q4H  . nystatin  5 mL Oral QID  . pantoprazole (PROTONIX) IV  40 mg Intravenous Q24H  . Zinc Oxide   Topical Q4H   Continuous Infusions: . sodium chloride 10 mL/hr at 01/30/17 0410  . famotidine (PEPCID) IV Stopped (02/04/17 2134)  . Marland KitchenTPN (  CLINIMIX-E) Adult     And  . fat emulsion    . Marland KitchenTPN (CLINIMIX-E) Adult 83 mL/hr at 02/04/17 1842     LOS: 19 days    Time spent: 25 minutes    Leisa Lenz, MD [facility] Pager [phone]  If 7PM-7AM, please contact night-coverage [email or web address] Password [facility] 02/05/2017, 10:44 AM

## 2017-02-05 NOTE — Consult Note (Signed)
Reason for Consult: Leakage at G-tube site Referring Physician: Fong Mccarry is an 77 y.o. male.  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  He had a percutaneous gastrostomy tube placed by interventional radiology I believe a number of months ago.  He has had several months of difficulty with persistent drainage around the G-tube.  He has had skin irritation and breakdown.  Due to persistent drainage he has had exchange of his tube for a large balloon tipped tube in interventional radiology and also for a G-J-tube, neither of which functioned well and there was continued leakage.  Subsequently he has been able to tolerate oral intake and his G-tube was removed about 2 weeks ago.  He however has had persistent drainage from the site when trying to eat.  No drainage when he is n.p.o.  He has been maintained on TNA during this time.  We were asked to see regarding options for healing of his G-tube site.  Past Medical History:  Diagnosis Date  . Ankylosing spondylitis ([place])   . Blood transfusion    hx of transfusion without reaction  . Bradycardia   . Cancer ([place]) 03/2016   oral  . Cholelithiasis   . Coronary artery disease    a. s/p CABG x 6 in 1997 (LIMA->LAD, VG->RI ->OM1->OM2, VG->PDA->PLV // b. 05/2011 Cath:  patent grafs, native prox rca and d2 dzs  ->med rx. // c. Myoview 1/18: not gated, large inferolateral scar, no ischemia; Intermediate Risk (IL scar old - on prior studies >> med rx)  . Crohn's colitis ([place]) 12/01/1997  . Diabetes mellitus   . Dysphagia   . Fatty liver   . GERD (gastroesophageal reflux disease)   . Heart block   . Hiatal hernia 12/03/2008  . History of radiation therapy 05/23/16- 07/09/16   Left Cheek/ 60 Gy in 30 fractions to gross disease, 63 Gy in 35 fractions to high risk nodal echelons, and 56 Gy in 35 fractions to intermedicate risk nodal echelons.   . History of  skin cancer   . Hx of echocardiogram    Echo 5/14:  Mild LVH, EF 55-60%, NL diast fxn, mild LAE   . Hyperlipidemia   . Hypertension   . Kidney stones   . Neck pain   . Pneumonia    hx of PNA  . Squamous cell cancer of buccal mucosa ([place])   . Stroke ([place]) 11/2016  . 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 cancer removal  2014   removed from left outter leg  . COLONOSCOPY  2012  . CORONARY ARTERY BYPASS GRAFT  04/19/1995   x7  . EXCISION ORAL TUMOR Left    left jaw and lymph node  . IR FLUORO GUIDE PORT INSERTION RIGHT  05/21/2016  . IR GASTR TUBE CONVERT GASTR-JEJ PER W/FL MOD SED  01/15/2017  . IR GASTROSTOMY TUBE MOD SED  05/21/2016  . IR GASTROSTOMY TUBE REMOVAL  01/19/2017  . IR RADIOLOGIST EVAL & MGMT  01/09/2017  . IR REPLC GASTRO/COLONIC TUBE PERCUT W/FLUORO  01/14/2017  . IR US GUIDE VASC ACCESS RIGHT  05/21/2016  . LEFT HEART CATHETERIZATION WITH CORONARY/GRAFT ANGIOGRAM N/A 05/24/2011   Procedure: LEFT HEART CATHETERIZATION WITH Beatrix Fetters;  Surgeon: Larey Dresser, MD;  Location: [facility] CATH LAB;  Service: Cardiovascular;  Laterality: N/A;  . MOHS SURGERY  X 2 off chin and nose  . TONSILLECTOMY AND ADENOIDECTOMY  1944  . TRIGGER FINGER RELEASE     x 2  . UMBILICAL HERNIA REPAIR      Family History  Problem Relation Age of Onset  . Heart disease Father   . Heart disease Mother   . Arthritis Mother   . Heart disease Unknown   . Crohn's disease Unknown   . Heart disease Sister   . Colon cancer Paternal Grandmother     Social History:  reports that he quit smoking about 34 years ago. He quit smokeless tobacco use about 7 years ago. His smokeless tobacco use included chew. He reports that he drinks alcohol. He reports that he does not use drugs.  Allergies: No Known Allergies  Current Facility-Administered Medications  Medication Dose Route Frequency Provider Last Rate Last Dose   . 0.9 %  sodium chloride infusion   Intravenous Continuous Thomes Lolling, RPH 10 mL/hr at 01/30/17 0410    . acetaminophen (TYLENOL) tablet 650 mg  650 mg Oral Q6H PRN Amin, Jeanella Flattery, MD       Or  . acetaminophen (TYLENOL) suppository 650 mg  650 mg Rectal Q6H PRN Amin, Ankit Chirag, MD      . albuterol (PROVENTIL) (2.5 MG/3ML) 0.083% nebulizer solution 2.5 mg  2.5 mg Nebulization Q6H PRN Amin, Ankit Chirag, MD      . clopidogrel (PLAVIX) tablet 75 mg  75 mg Oral Daily Alekh, Kshitiz, MD   75 mg at 02/05/17 1026  . dexamethasone (DECADRON) injection 1 mg  1 mg Intravenous Q24H Gorsuch, Ni, MD   1 mg at 02/05/17 1025  . famotidine (PEPCID) IVPB 20 mg premix  20 mg Intravenous Q24H [name], MD   Stopped at 02/04/17 2134  . TPN (CLINIMIX-E) Adult   Intravenous Continuous TPN Jarold, [place], [facility]       And  . fat emulsion 20 % infusion 240 mL  240 mL Intravenous Continuous TPN Agamjot, [place], [facility]      . insulin aspart (novoLOG) injection 0-20 Units  0-20 Units Subcutaneous Q4H Randa Spike, RPH   3 Units at 02/05/17 1204  . morphine 2 MG/ML injection 1 mg  1 mg Intravenous Q3H PRN Aline August, MD   1 mg at 02/05/17 0005  . nystatin (MYCOSTATIN) 100000 UNIT/ML suspension 500,000 Units  5 mL Oral QID Arby Barrette A, NP   500,000 Units at 02/05/17 1405  . ondansetron (ZOFRAN) tablet 4 mg  4 mg Oral Q6H PRN Amin, Ankit Chirag, MD   4 mg at 02/02/17 0436   Or  . ondansetron (ZOFRAN) injection 4 mg  4 mg Intravenous Q6H PRN Amin, Ankit Chirag, MD      . pantoprazole (PROTONIX) injection 40 mg  40 mg Intravenous Q24H Alvy Bimler, Ni, MD   40 mg at 02/05/17 1024  . sodium chloride flush (NS) 0.9 % injection 10-40 mL  10-40 mL Intracatheter PRN Jodie Echevaria, Amrit, MD   10 mL at 01/28/17 1812  . TPN (CLINIMIX-E) Adult   Intravenous Continuous TPN Daiton, [place], RPH 83 mL/hr at 02/04/17 1842    . Zinc Oxide (TRIPLE PASTE) 12.8 % ointment   Topical Q4H Robbie Lis, MD          Results for orders placed or performed during the hospital encounter of 01/17/17 (from the past 48 hour(s))  Glucose, capillary     Status: Abnormal   Collection Time: 02/03/17  5:06 PM  Result Value Ref Range   Glucose-Capillary 240 (H) 65 - 99 mg/dL  Glucose, capillary     Status: Abnormal   Collection Time: 02/03/17  7:54 PM  Result Value Ref Range   Glucose-Capillary 122 (H) 65 - 99 mg/dL  Glucose, capillary     Status: Abnormal   Collection Time: 02/04/17 12:03 AM  Result Value Ref Range   Glucose-Capillary 124 (H) 65 - 99 mg/dL  Glucose, capillary     Status: Abnormal   Collection Time: 02/04/17  3:58 AM  Result Value Ref Range   Glucose-Capillary 137 (H) 65 - 99 mg/dL  Triglycerides     Status: None   Collection Time: 02/04/17  6:42 AM  Result Value Ref Range   Triglycerides 42 <150 mg/dL    Comment: Performed at [facility], [place] [address] [place], [place] [address]  Comprehensive metabolic panel     Status: Abnormal   Collection Time: 02/04/17  6:52 AM  Result Value Ref Range   Sodium 135 135 - 145 mmol/L   Potassium 3.8 3.5 - 5.1 mmol/L   Chloride 100 (L) 101 - 111 mmol/L   CO2 29 22 - 32 mmol/L   Glucose, Bld 122 (H) 65 - 99 mg/dL   BUN 28 (H) 6 - 20 mg/dL   Creatinine, Ser 0.68 0.61 - 1.24 mg/dL   Calcium 8.9 8.9 - 10.3 mg/dL   Total Protein 5.5 (L) 6.5 - 8.1 g/dL   Albumin 2.4 (L) 3.5 - 5.0 g/dL   AST 41 15 - 41 U/L   ALT 46 17 - 63 U/L   Alkaline Phosphatase 140 (H) 38 - 126 U/L   Total Bilirubin 0.4 0.3 - 1.2 mg/dL   GFR calc non Af Amer >60 >60 mL/min   GFR calc Af Amer >60 >60 mL/min    Comment: (NOTE) The eGFR has been calculated using the CKD EPI equation. This calculation has not been validated in all clinical situations. eGFR's persistently <60 mL/min signify possible Chronic Kidney Disease.    Anion gap 6 5 - 15    Comment: Performed at [facility], [place] [address] [place], [place] [address]   Magnesium     Status: Abnormal   Collection Time: 02/04/17  6:52 AM  Result Value Ref Range   Magnesium 1.6 (L) 1.7 - 2.4 mg/dL    Comment: Performed at [facility], [place] [address] [place], [place] [address]  Phosphorus     Status: None   Collection Time: 02/04/17  6:52 AM  Result Value Ref Range   Phosphorus 4.0 2.5 - 4.6 mg/dL    Comment: Performed at [facility], [place] [address] [place], [place] [address]  CBC     Status: Abnormal   Collection Time: 02/04/17  6:52 AM  Result Value Ref Range   WBC 7.7 4.0 - 10.5 K/uL   RBC 2.74 (L) 4.22 - 5.81 MIL/uL   Hemoglobin 8.7 (L) 13.0 - 17.0 g/dL   HCT 25.4 (L) 39.0 - 52.0 %   MCV 92.7 78.0 - 100.0 fL   MCH 31.8 26.0 - 34.0 pg   MCHC 34.3 30.0 - 36.0 g/dL   RDW 15.9 (H) 11.5 - 15.5 %   Platelets 139 (L) 150 - 400 K/uL    Comment: Performed at [facility], [place] [address], [place], [place] [address]  Differential     Status: None   Collection Time: 02/04/17  6:52 AM  Result Value Ref Range   Neutrophils Relative % 76 %   Neutro Abs 5.9 1.7 - 7.7 K/uL   Lymphocytes Relative 15 %   Lymphs Abs 1.1 0.7 - 4.0 K/uL   Monocytes Relative 8 %   Monocytes Absolute 0.6 0.1 - 1.0 K/uL   Eosinophils Relative 1 %   Eosinophils Absolute 0.1 0.0 - 0.7 K/uL   Basophils Relative 0 %   Basophils Absolute 0.0 0.0 - 0.1 K/uL    Comment: Performed at [facility], [place] [address], [place], [place] [address]  Glucose, capillary     Status: Abnormal   Collection Time: 02/04/17  7:50 AM  Result Value Ref Range   Glucose-Capillary 130 (H) 65 - 99 mg/dL  Glucose, capillary     Status: Abnormal   Collection Time: 02/04/17 12:02 PM  Result Value Ref Range   Glucose-Capillary 127 (H) 65 - 99 mg/dL  Glucose, capillary     Status: Abnormal   Collection Time: 02/04/17  4:09 PM  Result Value Ref Range   Glucose-Capillary 211 (H) 65 - 99 mg/dL   Comment 1 Notify RN    Comment 2  Document in Chart   Prealbumin     Status: Abnormal   Collection Time: 02/04/17  4:15 PM  Result Value Ref Range   Prealbumin 13.5 (L) 18 - 38 mg/dL    Comment: Performed at [facility], [place] [address] [place], [place] [address]  Glucose, capillary     Status: Abnormal   Collection Time: 02/04/17 10:59 PM  Result Value Ref Range   Glucose-Capillary 126 (H) 65 - 99 mg/dL  Glucose, capillary     Status: Abnormal   Collection Time: 02/05/17  3:08 AM  Result Value Ref Range   Glucose-Capillary 176 (H) 65 - 99 mg/dL  CBC     Status: Abnormal   Collection Time: 02/05/17  5:40 AM  Result Value Ref Range   WBC 7.6 4.0 - 10.5 K/uL   RBC 2.69 (L) 4.22 - 5.81 MIL/uL   Hemoglobin 8.4 (L) 13.0 - 17.0 g/dL   HCT 25.3 (L) 39.0 - 52.0 %   MCV 94.1 78.0 - 100.0 fL   MCH 31.2 26.0 - 34.0 pg   MCHC 33.2 30.0 - 36.0 g/dL   RDW 15.6 (H) 11.5 - 15.5 %   Platelets 143 (L) 150 - 400 K/uL    Comment: Performed at [facility], [place] [address], [place], [place] [address]  Basic metabolic panel     Status: Abnormal   Collection Time: 02/05/17  5:40 AM  Result Value Ref Range   Sodium 136 135 - 145 mmol/L   Potassium 3.9 3.5 - 5.1 mmol/L   Chloride 101 101 - 111 mmol/L   CO2 31 22 - 32 mmol/L   Glucose, Bld 163 (H) 65 - 99 mg/dL   BUN 26 (H) 6 - 20 mg/dL   Creatinine, Ser 0.65 0.61 - 1.24 mg/dL   Calcium 8.9 8.9 - 10.3 mg/dL   GFR calc non Af Amer >60 >60 mL/min   GFR calc Af Amer >60 >60 mL/min    Comment: (NOTE) The eGFR has been calculated using the CKD EPI equation. This calculation has not been validated in all clinical situations. eGFR's persistently <60 mL/min signify possible Chronic Kidney Disease.    Anion gap 4 (L) 5 - 15    Comment: Performed at [facility], [place] [address] [place],  [place] [address]  Magnesium     Status: None   Collection Time: 02/05/17  5:40 AM  Result Value Ref Range   Magnesium 1.8 1.7 - 2.4 mg/dL    Comment:  Performed at [facility], [place] [address], [place], [place] [address]  Phosphorus     Status: None   Collection Time: 02/05/17  5:40 AM  Result Value Ref Range   Phosphorus 3.5 2.5 - 4.6 mg/dL    Comment: Performed at [facility], [place] [address] [place], [place] [address]  Glucose, capillary     Status: Abnormal   Collection Time: 02/05/17  7:57 AM  Result Value Ref Range   Glucose-Capillary 179 (H) 65 - 99 mg/dL   Comment 1 Notify RN    Comment 2 Document in Chart   Glucose, capillary     Status: Abnormal   Collection Time: 02/05/17 11:54 AM  Result Value Ref Range   Glucose-Capillary 127 (H) 65 - 99 mg/dL   Comment 1 Notify RN    Comment 2 Document in Chart     No results found.  Review of Systems  Respiratory: Positive for wheezing.    Blood pressure 118/63, pulse 71, temperature 98.6 F (37 C), temperature source Oral, resp. rate 15, height 5' 8"  (1.727 m), weight 67.9 kg (149 lb 11.1 oz), SpO2 98 %. Physical Exam General: Thin chronically ill but alert and pleasant Caucasian male in no distress Skin: Generally no rash or infection.  CT abdomen Lungs: Clear easy respirations without increased work of breathing Abdomen: Soft and nontender.  G-tube site in left upper quadrant is dressed.  Zinc oxide on surrounding skin which is not removed but there is no significant skin breakdown at present.  There is a small skin opening at the G-tube site which appears to be just a few millimeters and slight drainage with pressure on the abdomen. Extremities: No edema or deformity Neurologic: He is alert and fully oriented.  Affect normal.  Assessment/Plan: Persistent drainage at his percutaneous gastrostomy site 2 weeks after removal.  I discussed with the patient and his family that there are essentially 3 courses of action.  We could try to replace his tube with a large balloon tipped tube to see if we could get a seal.  However this has been tried  in the past and was not effective and of course would enlarge the opening and currently we would prefer that it is sealed as he is able to take nutrition by mouth.  The area could be taken down and closed surgically but this would have to be done intra-abdominally with takedown of his stomach from the abdominal wall and suture closure of the stomach which of course would be a general anesthetic and significant operation which would entail some risk in his current condition. Family and the patient report that there is progressively less drainage and a smaller opening over the last week.  I think there is a good chance that this will closed with current wound care over the next week or 2.  I think the best course is to continue TNA and n.p.o. while this heals and I would hope that it will be sealed within 1-2 weeks.  If it is not we could reconsider surgical closure but again this would be an intra-abdominal procedure and not insignificant. I agree with current wound care measures with zinc oxide barrier over the skin and packing of the small opening with a pressure dressing. We  will see as needed, please call for questions.  Darene Lamer Sharaya Boruff 02/05/2017, 2:28 PM

## 2017-02-06 ENCOUNTER — Encounter: Payer: Self-pay | Admitting: *Deleted

## 2017-02-06 LAB — CBC
HEMATOCRIT: 25.3 % — AB (ref 39.0–52.0)
Hemoglobin: 8.4 g/dL — ABNORMAL LOW (ref 13.0–17.0)
MCH: 30.7 pg (ref 26.0–34.0)
MCHC: 33.2 g/dL (ref 30.0–36.0)
MCV: 92.3 fL (ref 78.0–100.0)
Platelets: 136 10*3/uL — ABNORMAL LOW (ref 150–400)
RBC: 2.74 MIL/uL — ABNORMAL LOW (ref 4.22–5.81)
RDW: 15.7 % — AB (ref 11.5–15.5)
WBC: 7.8 10*3/uL (ref 4.0–10.5)

## 2017-02-06 LAB — GLUCOSE, CAPILLARY
GLUCOSE-CAPILLARY: 181 mg/dL — AB (ref 65–99)
Glucose-Capillary: 107 mg/dL — ABNORMAL HIGH (ref 65–99)
Glucose-Capillary: 115 mg/dL — ABNORMAL HIGH (ref 65–99)
Glucose-Capillary: 128 mg/dL — ABNORMAL HIGH (ref 65–99)
Glucose-Capillary: 136 mg/dL — ABNORMAL HIGH (ref 65–99)
Glucose-Capillary: 228 mg/dL — ABNORMAL HIGH (ref 65–99)

## 2017-02-06 LAB — BASIC METABOLIC PANEL
ANION GAP: 5 (ref 5–15)
BUN: 27 mg/dL — AB (ref 6–20)
CO2: 29 mmol/L (ref 22–32)
Calcium: 8.9 mg/dL (ref 8.9–10.3)
Chloride: 100 mmol/L — ABNORMAL LOW (ref 101–111)
Creatinine, Ser: 0.63 mg/dL (ref 0.61–1.24)
GFR calc Af Amer: >60 mL/min (ref >60)
GFR calc non Af Amer: >60 mL/min (ref >60)
GLUCOSE: 177 mg/dL — AB (ref 65–99)
Potassium: 3.8 mmol/L (ref 3.5–5.1)
Sodium: 134 mmol/L — ABNORMAL LOW (ref 135–145)

## 2017-02-06 MED ORDER — TRACE MINERALS CR-CU-MN-SE-ZN 10-1000-500-60 MCG/ML IV SOLN
INTRAVENOUS | Status: AC
  Administered 2017-02-06: 18:00:00 via INTRAVENOUS
  Filled 2017-02-06: qty 1992

## 2017-02-06 MED ORDER — FAT EMULSION 20 % IV EMUL
240.0000 mL | INTRAVENOUS | Status: AC
  Administered 2017-02-06: 240 mL via INTRAVENOUS
  Filled 2017-02-06: qty 250

## 2017-02-06 NOTE — Progress Notes (Signed)
[place] NOTE   Pharmacy Consult for TPN Indication: unable to tolerate PO intake d/t PEG tube site leakage  Patient Measurements: Body mass index is 22.76 kg/m. Filed Weights   01/21/17 0610 01/28/17 0410 02/04/17 0359  Weight: 149 lb 11.2 oz (67.9 kg) 149 lb 7.6 oz (67.8 kg) 149 lb 11.1 oz (67.9 kg)  Ideal Body Weight:  70 kg  HPI:  75 yoM admitted on 1/17 with c/o G-tube leaking blood and gastric contents around exit site causing erosion of surrounding skin.  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  IR converted to larger G-J tube on 1/15, but it has continued to leak.  On 1/19 the [place] tube was permanently removed without complications.  He continues to have leakage through the non-healing tube site and is therefore unable to meet nutrition goals with oral diet. Pharmacy is consulted for trial of short-term TPN to provide nutrition and avoid oral intake that is exacerbating the healing process around his feeding tube site.  Significant events:  1/28 consumed water with leakage at previous G tube site - feeding tube site has not healed, but is improving.  To continue TPN. 2/4 feeding tube site still leaking 2/5 surgery recs to continue TPN for another 1-2 weeks to give Gtube site time to heal  Insulin requirements past 24 hours: 25 units SSI - PTA metformin 500mg  BID, chronic prednisone 5mg .  Currently on Dexamethasone IV.  Current Nutrition: NPO   IVF: NS at 10 ml/hr  Central access: Implanted port (05/21/16) TPN start date: 01/22/17  ASSESSMENT                                                                                                          Today:   Glucose:  goal range < 150.  CBGs slightly elevated but overall controlled (range 115-203) with SSI and TPN insulin.    PTA:  Metformin 500mg  BID, chronic prednisone 5mg   Started  dexamethasone IV while NPO.  Reduced from 2mg  (1/23-1/29) to 1mg  (1/29 -   )  Electrolytes:  WNL,last Mag and phos on 2/5  Renal:  Stable, Scr wnl  LFTs:  Low/stable  TGs:  91 (1/23) 60 (1/28) 42 (2/4)  Prealbumin:  <5 (1/23), 14.5 (1/28)   NUTRITIONAL GOALS                                                                                             RD recs (1/28):  110-120 Protein, 2350-2550 Kcal  Clinimix 5/20 at a goal rate of 83 ml/hr + 20% fat emulsion 240  ml/day to provide: 100 g of protein and 2233 kCals per day meeting 91% of protein and 95% of kCal needs  - Glucose infusion rate will be 4.07 mg/kg/min (Maximum 5 mg/kg/min)   PLAN                                                                                                           At 1800 today:  Continue Clinimix E 5/20 at 83 ml/hr (goal rate)  TPN to contain standard multivitamins and trace element daily.  Increase regular insulin to 45 units  in TPN / 24 hrs.   20% fat emulsion at 20 ml/hr x12 hours/day  IVF at 10 ml/hr.  Continue CBGs and resistant SSI q4h.   TPN lab panels on Mondays & Thursdays.   Dolly Rias RPh 02/06/2017, 10:54 AM Pager [phone]

## 2017-02-06 NOTE — Progress Notes (Signed)
PROGRESS NOTE    Andres Smith  QMG:[id number] DOB: 1940/06/16 DOA: 01/17/2017 PCP: No primary care provider on file.   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 G-tube was removed.  Abdominal wall cellulitis around the previous G tube site  was being treated with antibiotics.  Currently he is on TPN as he is unable to tolerate by mouth because when he starts eating, there is leakage of gastric contents  from the previous G tube site. Hoping for closure of the G-tube site so that patient can be started on oral feeding.   Assessment & Plan:   Principal Problem:   Malfunction of gastrostomy tube ([place]) Active Problems:   CROHN'S DISEASE-LARGE INTESTINE   Protein-calorie malnutrition, severe   Pressure injury of skin   Anemia associated with chemotherapy   Hypokalemia   Hypomagnesemia  G-tube malfunction with abdominal cellulitis: S/P G-tube removal by IR. Cultures were negative.He finished the antibiotics course . Continue wound care around the area of the cellulitis Wound care following.  Continue PPI Hoping for the healing of the leak and starting on clear liquid diet soon On 01/28/17 ,we  checked the leakage from previous G tube site  at the bedside.  He was given a glass of water through mouth and immediately after 1-2 minutes the water started flowing from his previous G-tube site. Started clears on 2/4 but leakage from tube again noted On TPN General surgery was consulted for possible surgical closure of the previous G-tube site.  Recommended conservative management and hold for surgical intervention for now.  Moderate to severe protein calorie malnutrition/failure to thrive: Patient was afraid to eat because of pain/leakage of  the gastric contents from the previous feeding tube site. Started on TPN on 01/22/17.  Pharmacy on board.  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 Alvy Bimler .  Not a candidate for chemotherapy now.Was on Gtube which has been removed. Currently he can swallow food but cannot eat due to gastric leakage. Oncology following here.  History of recent CVA: On Plavix.   Coronary artery disease: Status post CABG.  Stable  Hypertension: Currently blood pressure stable.  We will continue to monitor.  Crohn's disease: On IV decadron.  Continue supportive care.  Hypokalemia/hypomagnesemia: Supplemented .  We will continue to monitor the levels.  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 We will continue to monitor the counts and transfuse as necessary. S/P 1 U PRBC transfusion 01/30/2016   Generalized weakness: Physical therapy following.    Home health has been ordered.  Complaint of right groin pain on ambulation today.  Will check and order appropriate imaging if necessary.  Hyperglycemia: Continue sliding scale insulin.   Stage 1 pressure ulcer: Present on the coccygeal area.  No drainage, or breakage of skin.  Continue supportive care    DVT prophylaxis: SCD Code Status: Partial code.  No intubation.  CPR okay Family Communication: No family present at the bedside Disposition Plan: Home once cleared by oncology   Consultants: Oncology  Procedures: TPN  Antimicrobials: Ceftriaxone  01/20/17-01/29/17 Vancomycin 01/17/17-01/20/17     Subjective: Patient seen and examined the bedside this morning.  Complaint of pain in the right groin on ambulation.  Previous G-tube site is healing well.  Objective: Vitals:   02/05/17 1323 02/05/17 2041  02/06/17 0508 02/06/17 1348  BP: 118/63 123/64 114/74 101/86  Pulse: 71 78 64 100  Resp: 15 16 16 16   Temp: 98.6 F (37 C) 98.1 F (36.7 C) 98 F (36.7 C) 97.7 F (36.5 C)  TempSrc: Oral  Oral Oral Oral  SpO2: 98% 99% 99%   Weight:      Height:        Intake/Output Summary (Last 24 hours) at 02/06/2017 1453 Last data filed at 02/06/2017 1350 Gross per 24 hour  Intake 3537.44 ml  Output 1600 ml  Net 1937.44 ml   Filed Weights   01/21/17 0610 01/28/17 0410 02/04/17 0359  Weight: 67.9 kg (149 lb 11.2 oz) 67.8 kg (149 lb 7.6 oz) 67.9 kg (149 lb 11.1 oz)    Examination:  General exam: Appears calm and comfortable ,Not in distress,average built Respiratory system: Bilateral equal air entry, normal vesicular breath sounds, no wheezes or crackles  Cardiovascular system: S1 & S2 heard, RRR. No JVD, murmurs, rubs, gallops or clicks. No pedal edema. Gastrointestinal system: Abdomen is nondistended, soft and nontender. No organomegaly or masses felt. Normal bowel sounds heard. Central nervous system: Alert and oriented. No focal neurological deficits. Extremities: No edema, no clubbing ,no cyanosis, distal peripheral pulses palpable. Skin: No rashes, lesions or ulcers,no icterus ,no pallor Psychiatry: Judgement and insight appear normal. Mood & affect appropriate.     Data Reviewed: I have personally reviewed following labs and imaging studies  CBC: Recent Labs  Lab 02/02/17 0426 02/03/17 0445 02/04/17 0652 02/05/17 0540 02/06/17 0524  WBC 7.7 6.9 7.7 7.6 7.8  NEUTROABS  --   --  5.9  --   --   HGB 8.7* 8.4* 8.7* 8.4* 8.4*  HCT 25.8* 25.6* 25.4* 25.3* 25.3*  MCV 93.1 94.1 92.7 94.1 92.3  PLT 146* 150 139* 143* 093*   Basic Metabolic Panel: Recent Labs  Lab 01/31/17 0439 02/01/17 0500 02/02/17 0426 02/03/17 0445 02/04/17 0652 02/05/17 0540 02/06/17 0524  NA 135 137 136 131* 135 136 134*  K 3.9 3.7 4.0 3.7 3.8 3.9 3.8  CL 100* 101 101 98* 100* 101 100*  CO2 30 29 29 26 29 31 29   GLUCOSE 176* 147* 128* 148* 122* 163* 177*  BUN 25* 27* 26* 25* 28* 26* 27*  CREATININE 0.67 0.69 0.67 0.59* 0.68 0.65 0.63  CALCIUM 8.8* 8.9 8.9 8.5* 8.9 8.9 8.9  MG 1.9 1.6*  1.7  --  1.6* 1.8  --   PHOS 3.4  --  3.7  --  4.0 3.5  --    GFR: Estimated Creatinine Clearance: 74.3 mL/min (by C-G formula based on SCr of 0.63 mg/dL). Liver Function Tests: Recent Labs  Lab 01/31/17 0439 02/04/17 0652  AST 31 41  ALT 22 46  ALKPHOS 75 140*  BILITOT 0.6 0.4  PROT 5.4* 5.5*  ALBUMIN 2.5* 2.4*   No results for input(s): LIPASE, AMYLASE in the last 168 hours. No results for input(s): AMMONIA in the last 168 hours. Coagulation Profile: No results for input(s): INR, PROTIME in the last 168 hours. Cardiac Enzymes: No results for input(s): CKTOTAL, CKMB, CKMBINDEX, TROPONINI in the last 168 hours. BNP (last 3 results) No results for input(s): PROBNP in the last 8760 hours. HbA1C: No results for input(s): HGBA1C in the last 72 hours. CBG: Recent Labs  Lab 02/05/17 2040 02/06/17 0042 02/06/17 0506 02/06/17 0759 02/06/17 1226  GLUCAP 154* 136* 181* 115* 228*   Lipid Profile: Recent Labs    02/04/17 8182  TRIG 42   Thyroid Function Tests: No results for input(s): TSH, T4TOTAL, FREET4, T3FREE, THYROIDAB in the last 72 hours. Anemia Panel: No results for input(s): VITAMINB12, FOLATE, FERRITIN, TIBC, IRON, RETICCTPCT in the last 72 hours. Sepsis Labs: No results for input(s): PROCALCITON, LATICACIDVEN in the last 168 hours.  No results found for this or any previous visit (from the past 240 hour(s)).       Radiology Studies: No results found.      Scheduled Meds: . clopidogrel  75 mg Oral Daily  . dexamethasone  1 mg Intravenous Q24H  . insulin aspart  0-20 Units Subcutaneous Q4H  . nystatin  5 mL Oral QID  . pantoprazole (PROTONIX) IV  40 mg Intravenous Q24H  . Zinc Oxide   Topical Q4H   Continuous Infusions: . sodium chloride 10 mL/hr at 01/30/17 0410  . famotidine (PEPCID) IV Stopped (02/05/17 2119)  . Marland KitchenTPN (CLINIMIX-E) Adult     And  . fat emulsion    . Marland KitchenTPN (CLINIMIX-E) Adult 83 mL/hr at 02/05/17 1740     LOS: 20 days     Time spent: 25 mins    Rakhi Romagnoli Jodie Echevaria, MD [facility] Pager [phone]  If 7PM-7AM, please contact night-coverage [email or web address] Password [email or web address] 02/06/2017, 2:53 PM

## 2017-02-06 NOTE — Consult Note (Addendum)
   [facility] CM Inpatient Consult   02/06/2017  Andres Smith 1940/08/21 [id number]    [facility] Care Management follow up.   Spoke with inpatient RNCM prior to patient engagement.   Spoke with [name] at bedside about [facility] services. He is agreeable and written consent obtained. [facility] Care Management folder provided.   Offered to contact patient's wife to discuss as well. [name] states "she has company right now". I will just tell her about the program. Discussed that writer's contact information is in the folder should Mrs. Conover have any questions.  Emphasized that [facility] program will not interfere or replace services provided by home health. Mr. Burkemper to have [place] with the [facility] program.  Will make referral to [facility] for follow up.  Marthenia Rolling, MSN-Ed, RN,BSN [facility] Liaison [phone]

## 2017-02-06 NOTE — Consult Note (Signed)
[facility] Nurse wound follow up Wound type:stage II on sacrum Measurement:reddened area 4cm x 4cm x 0cn with 1cm x 0.5cm x 0.1cm stage II opening in center of very thin patient Wound bed: pale pink Drainage (amount, consistency, odor) none Periwound:reddened but intact Dressing procedure/placement/frequency:For this wound I have placed and ordered Hydrocolloid to be changed every 3-5 days and prn soiling or rolling up. Patient reminded importance of staying off his back.    Wound type: excoriation around former Peg site Measurement:reddened area 4cm x 4cm x 0.1cm excoriated area around peg site, much improved, no longer painful Wound bed: pale pink Drainage (amount, consistency, odor) none Periwound:reddened but intact Dressing procedure/placement/frequency:Continue Q4H dressing changes with Aquacel Ag+ and gauze after applying Triple Paste. Dressing is made smaller and paste removed off of large surrounding area that is healed. We will follow this patient and remain available to this patient, nursing, and the medical and surgical teams.  Fara Olden, RN-C, WTA-C, [place] Wound Treatment Associate Ostomy Care Associate

## 2017-02-06 NOTE — Progress Notes (Signed)
PT Cancellation Note  Patient Details Name: KURK CORNIEL MRN: [id number] DOB: December 21, 1940   Cancelled Treatment:    Reason Eval/Treat Not Completed: Fatigue/lethargy limiting ability to participate(pt sleeping, he briefly aroused when I entered room and requested I try back later after his nap. Will follow. ) He reported he walked in the hallway without dizziness earlier this morning.    Blondell Reveal Kistler 02/06/2017, 11:57 AM [phone]

## 2017-02-06 NOTE — Progress Notes (Signed)
Physical Therapy Treatment Patient Details Name: Andres Smith MRN: [id number] DOB: 11-08-1940 Today's Date: 02/06/2017    History of Present Illness 77 year old male with past medical history of buccal mucosa squamous cell cancer, dysphagia with G-tube in place, atrial fibrillation, coronary artery disease status post CABG, recent CVA was admitted for the evaluation of malfunction of his G-tube and possible abdominal cellulitis.    PT Comments    Pt was only able to tolerate ambulating 16' due to 8/10 R hip pain. No tenderness to palpation of R hip. R hip flexion strength +4/5, knee extension +4/5. Sensation intact to light touch RLE. RN notified.   Follow Up Recommendations  No PT follow up     Equipment Recommendations  None recommended by PT    Recommendations for Other Services       Precautions / Restrictions Precautions Precautions: Fall Precaution Comments: recently tripped on threshold of the door at home and fell Restrictions Weight Bearing Restrictions: No    Mobility  Bed Mobility               General bed mobility comments: up in chair  Transfers Overall transfer level: Needs assistance   Transfers: Sit to/from Stand Sit to Stand: Min guard         General transfer comment: min/guard 2* severe R hip pain  Ambulation/Gait Ambulation/Gait assistance: Min guard Ambulation Distance (Feet): 16 Feet Assistive device: 1 person hand held assist Gait Pattern/deviations: Steppage;Decreased stride length;Wide base of support;Step-to pattern   Gait velocity interpretation: Below normal speed for age/gender General Gait Details: distance limited by 8/10 R hip pain   Stairs            Wheelchair Mobility    Modified Rankin (Stroke Patients Only)       Balance                                            Cognition Arousal/Alertness: Awake/alert Behavior During Therapy: WFL for tasks assessed/performed Overall Cognitive  Status: Within Functional Limits for tasks assessed                                        Exercises      General Comments        Pertinent Vitals/Pain Pain Score: 8  Pain Location: R hip with walking Pain Descriptors / Indicators: Sore Pain Intervention(s): Limited activity within patient's tolerance;Monitored during session(pt declined pain meds)    Home Living                      Prior Function            PT Goals (current goals can now be found in the care plan section) Acute Rehab PT Goals Patient Stated Goal: To go home when able to eat PT Goal Formulation: With patient Time For Goal Achievement: 02/11/17 Potential to Achieve Goals: Good Progress towards PT goals: Progressing toward goals(R hip pain)    Frequency    Min 3X/week      PT Plan Current plan remains appropriate    Co-evaluation              AM-PAC PT "6 Clicks" Daily Activity  Outcome Measure  Difficulty turning over in bed (including adjusting bedclothes, sheets  and blankets)?: None Difficulty moving from lying on back to sitting on the side of the bed? : None Difficulty sitting down on and standing up from a chair with arms (e.g., wheelchair, bedside commode, etc,.)?: A Little Help needed moving to and from a bed to chair (including a wheelchair)?: A Little Help needed walking in hospital room?: A Little Help needed climbing 3-5 steps with a railing? : A Lot 6 Click Score: 19    End of Session Equipment Utilized During Treatment: Gait belt Activity Tolerance: Patient limited by pain(dizziness) Patient left: with call bell/phone within reach;in chair;with family/visitor present Nurse Communication: Mobility status;Other (comment)(R hip pain) PT Visit Diagnosis: Other abnormalities of gait and mobility (R26.89);History of falling (Z91.81);Difficulty in walking, not elsewhere classified (R26.2);Pain Pain - Right/Left: Right Pain - part of body: Hip      Time: 1310-1318 PT Time Calculation (min) (ACUTE ONLY): 8 min  Charges:  $Gait Training: 8-22 mins                    G Codes:         Blondell Reveal Kistler 02/06/2017, 1:23 PM [phone]

## 2017-02-07 ENCOUNTER — Ambulatory Visit: Payer: PPO | Admitting: Nurse Practitioner

## 2017-02-07 ENCOUNTER — Inpatient Hospital Stay (HOSPITAL_COMMUNITY): Payer: PPO

## 2017-02-07 LAB — COMPREHENSIVE METABOLIC PANEL
ALBUMIN: 2.5 g/dL — AB (ref 3.5–5.0)
ALK PHOS: 147 U/L — AB (ref 38–126)
ALT: 34 U/L (ref 17–63)
AST: 28 U/L (ref 15–41)
Anion gap: 5 (ref 5–15)
BILIRUBIN TOTAL: 0.3 mg/dL (ref 0.3–1.2)
BUN: 28 mg/dL — AB (ref 6–20)
CALCIUM: 9 mg/dL (ref 8.9–10.3)
CO2: 29 mmol/L (ref 22–32)
CREATININE: 0.65 mg/dL (ref 0.61–1.24)
Chloride: 101 mmol/L (ref 101–111)
GFR calc Af Amer: >60 mL/min (ref >60)
GFR calc non Af Amer: >60 mL/min (ref >60)
GLUCOSE: 167 mg/dL — AB (ref 65–99)
Potassium: 4.1 mmol/L (ref 3.5–5.1)
Sodium: 135 mmol/L (ref 135–145)
Total Protein: 5.6 g/dL — ABNORMAL LOW (ref 6.5–8.1)

## 2017-02-07 LAB — GLUCOSE, CAPILLARY
GLUCOSE-CAPILLARY: 121 mg/dL — AB (ref 65–99)
GLUCOSE-CAPILLARY: 127 mg/dL — AB (ref 65–99)
GLUCOSE-CAPILLARY: 138 mg/dL — AB (ref 65–99)
Glucose-Capillary: 195 mg/dL — ABNORMAL HIGH (ref 65–99)
Glucose-Capillary: 166 mg/dL — ABNORMAL HIGH (ref 65–99)
Glucose-Capillary: 271 mg/dL — ABNORMAL HIGH (ref 65–99)
Glucose-Capillary: 274 mg/dL — ABNORMAL HIGH (ref 65–99)

## 2017-02-07 LAB — PHOSPHORUS: Phosphorus: 3.9 mg/dL (ref 2.5–4.6)

## 2017-02-07 LAB — MAGNESIUM: Magnesium: 1.6 mg/dL — ABNORMAL LOW (ref 1.7–2.4)

## 2017-02-07 MED ORDER — MAGNESIUM SULFATE 2 GM/50ML IV SOLN
2.0000 g | Freq: Once | INTRAVENOUS | Status: AC
Start: 2017-02-07 — End: 2017-02-07
  Administered 2017-02-07: 2 g via INTRAVENOUS
  Filled 2017-02-07: qty 50

## 2017-02-07 MED ORDER — TRACE MINERALS CR-CU-MN-SE-ZN 10-1000-500-60 MCG/ML IV SOLN
INTRAVENOUS | Status: AC
  Administered 2017-02-07: 18:00:00 via INTRAVENOUS
  Filled 2017-02-07: qty 1992

## 2017-02-07 MED ORDER — GUAIFENESIN ER 600 MG PO TB12
600.0000 mg | ORAL_TABLET | Freq: Two times a day (BID) | ORAL | Status: DC
  Filled 2017-02-07 (×4): qty 1

## 2017-02-07 MED ORDER — FAT EMULSION 20 % IV EMUL
240.0000 mL | INTRAVENOUS | Status: AC
  Administered 2017-02-07: 240 mL via INTRAVENOUS
  Filled 2017-02-07: qty 250

## 2017-02-07 NOTE — Progress Notes (Signed)
Andres Smith   Pharmacy Consult for TPN Indication: unable to tolerate PO intake d/t PEG tube site leakage  Patient Measurements: Body mass index is 22.76 kg/m. Filed Weights   01/21/17 0610 01/28/17 0410 02/04/17 0359  Weight: 149 lb 11.2 oz (67.9 kg) 149 lb 7.6 oz (67.8 kg) 149 lb 11.1 oz (67.9 kg)  Ideal Body Weight:  70 kg  HPI:  52 yoM admitted on 1/17 with c/o G-tube leaking blood and gastric contents around exit site causing erosion of surrounding skin.  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  IR converted to larger G-J tube on 1/15, but it has continued to leak.  On 1/19 the [place] tube was permanently removed without complications.  He continues to have leakage through the non-healing tube site and is therefore unable to meet nutrition goals with oral diet. Pharmacy is consulted for trial of short-term TPN to provide nutrition and avoid oral intake that is exacerbating the healing process around his feeding tube site.  Significant events:  1/28 consumed water with leakage at previous G tube site - feeding tube site has not healed, but is improving.  To continue TPN. 2/4 feeding tube site still leaking 2/5 surgery recs to continue TPN for another 1-2 weeks to give Gtube site time to heal  Insulin requirements past 24 hours: 18 units SSI - PTA metformin 500mg  BID, chronic prednisone 5mg .  Currently on Dexamethasone IV.  Current Nutrition: NPO   IVF: NS at 10 ml/hr  Central access: Implanted port (05/21/16) TPN start date: 01/22/17  ASSESSMENT                                                                                                          Today:   Glucose:  goal range < 150.  CBGs slightly elevated but overall controlled (range 115-228) with SSI and TPN insulin.    PTA:  Metformin 500mg  BID, chronic prednisone 5mg   Started  dexamethasone IV while NPO.  Reduced from 2mg  (1/23-1/29) to 1mg  (1/29 -   )  Electrolytes:  Mag low, all others WNL  Renal:  Stable, Scr wnl  LFTs:  Low/stable  TGs:  91 (1/23) 60 (1/28) 42 (2/4)  Prealbumin:  <5 (1/23), 14.5 (1/28)   NUTRITIONAL GOALS                                                                                             RD recs (1/28):  110-120 Protein, 2350-2550 Kcal  Clinimix 5/20 at a goal rate of 83 ml/hr + 20% fat emulsion 240 ml/day  to provide: 100 g of protein and 2233 kCals per day meeting 91% of protein and 95% of kCal needs  - Glucose infusion rate will be 4.07 mg/kg/min (Maximum 5 mg/kg/min)   PLAN                                                                                                          Mag Sulfate 2gm IV x 1  At 1800 today:  Continue Clinimix E 5/20 at 83 ml/hr (goal rate)  TPN to contain standard multivitamins and trace element daily.  Continue regular insulin at 45 units  in TPN / 24 hrs.   20% fat emulsion at 20 ml/hr x12 hours/day  IVF at 10 ml/hr.  Continue CBGs and resistant SSI q4h.   Bmet with mag and phos in am  TPN lab panels on Mondays & Thursdays.   Dolly Rias RPh 02/07/2017, 10:25 AM Pager [phone]

## 2017-02-07 NOTE — Progress Notes (Signed)
Due to pt being in [facility] program with [facility]. Medical director Dr. Reynaldo Minium is willing to take over management of TPN at home when pt discharges. Dr. Reynaldo Minium to contact Dr. Alvy Bimler to go over plan. This CM will let attending MD know once medical director has connected with Dr. Alvy Bimler. CM will continue to follow. Marney Doctor RN,BSN,NCM [phone]

## 2017-02-07 NOTE — Progress Notes (Signed)
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    PT Comments    Patient received in bed, pleasant but adamantly refusing all OOB mobility today, states "I've already walked this morning and I'm still very worried about my hip, it is still hurting very badly when I get up on it". He is agreeable to bed level therapeutic exercises today, performed with PT resistance and to tolerance this session. RN reports that MD has ordered imaging to further investigate hip pain. Patient left in bed with all needs met, questions/concerns addressed, RN present and attending.     Follow Up Recommendations  No PT follow up     Equipment Recommendations  None recommended by PT    Recommendations for Other Services       Precautions / Restrictions Precautions Precautions: Fall Precaution Comments: recently tripped on threshold of the door at home and fell Restrictions Weight Bearing Restrictions: No    Mobility  Bed Mobility               General bed mobility comments: refused mobility   Transfers                 General transfer comment: refused mobility   Ambulation/Gait             General Gait Details: refused mobility    Stairs            Wheelchair Mobility    Modified Rankin (Stroke Patients Only)       Balance Overall balance assessment: Needs assistance   Sitting balance-Leahy Scale: Good       Standing balance-Leahy Scale: Fair                              Cognition Arousal/Alertness: Awake/alert Behavior During Therapy: WFL for tasks  assessed/performed Overall Cognitive Status: Within Functional Limits for tasks assessed                                        Exercises General Exercises - Lower Extremity Heel Slides: Both;10 reps;Supine Hip ABduction/ADduction: Both;10 reps;Supine Straight Leg Raises: Both;10 reps;Supine Other Exercises Other Exercises: LE hip press with PT restistance 1x10 B; supine clams 1x10 with PT resistance B     General Comments General comments (skin integrity, edema, etc.): patient declines all mobilty today due to his concern over ongoing pain in his R hip, agreeable to ther ex in bed       Pertinent Vitals/Pain Pain Assessment: 0-10 Pain Score: 8  Pain Location: R hip with walking Pain Descriptors / Indicators: Sore Pain Intervention(s): Limited activity within patient's tolerance;Monitored during session    Home Living                      Prior Function            PT Goals (current goals can now be found in the care plan section) Acute Rehab PT Goals Patient Stated Goal: To go home when able to eat PT  Goal Formulation: With patient Time For Goal Achievement: 02/11/17 Potential to Achieve Goals: Good Progress towards PT goals: Not progressing toward goals - comment(limited by severe hip pain today, continue to monitor )    Frequency    Min 3X/week      PT Plan Current plan remains appropriate    Co-evaluation              AM-PAC PT "6 Clicks" Daily Activity  Outcome Measure  Difficulty turning over in bed (including adjusting bedclothes, sheets and blankets)?: None Difficulty moving from lying on back to sitting on the side of the bed? : None Difficulty sitting down on and standing up from a chair with arms (e.g., wheelchair, bedside commode, etc,.)?: None Help needed moving to and from a bed to chair (including a wheelchair)?: A Little Help needed walking in hospital room?: A Little Help needed climbing 3-5 steps with a railing?  : A Lot 6 Click Score: 20    End of Session   Activity Tolerance: Patient tolerated treatment well Patient left: in bed;with bed alarm set;with nursing/sitter in room   PT Visit Diagnosis: Other abnormalities of gait and mobility (R26.89);History of falling (Z91.81);Difficulty in walking, not elsewhere classified (R26.2);Pain Pain - Right/Left: Right Pain - part of body: Hip     Time: 1105-1120 PT Time Calculation (min) (ACUTE ONLY): 15 min  Charges:  $Therapeutic Exercise: 8-22 mins                    G Codes:       Deniece Ree PT, DPT, CBIS  Supplemental Physical Therapist [place]   Pager [phone]

## 2017-02-07 NOTE — Progress Notes (Addendum)
PROGRESS NOTE    Andres Smith  TFT:[id number] DOB: 10-26-1940 DOA: 01/17/2017 PCP: No primary care provider on file.   Brief Narrative: 78 year old male with past medical history of buccal mucosa squamous cell cancer, dysphagia with G-tube in place, atrial fibrillation, coronary artery disease status post CABG, recent CVA was admitted for the evaluation of malfunction of his G-tube and possible abdominal cellulitis. G-tube was removed.  Abdominal wall cellulitis around the previous G tube site  was being treated with antibiotics.  Currently he is on TPN as he is unable to tolerate by mouth because when he starts eating, there is leakage of gastric contents  from the previous G tube site. Hoping for closure of the G-tube site so that patient can be started on oral feeding.   Assessment & Plan:   Principal Problem:   Malfunction of gastrostomy tube ([place]) Active Problems:   CROHN'S DISEASE-LARGE INTESTINE   Protein-calorie malnutrition, severe   Pressure injury of skin   Anemia associated with chemotherapy   Hypokalemia   Hypomagnesemia  G-tube malfunction with abdominal cellulitis: S/P G-tube removal by IR. Cultures were negative.He finished the antibiotics course . Continue wound care around the area of the cellulitis Wound care following.  Continue PPI Hoping for the healing of the leak and starting on clear liquid diet soon On 01/28/17 ,we  checked the leakage from previous G tube site  at the bedside.  He was given a glass of water through mouth and immediately after 1-2 minutes the water started flowing from his previous G-tube site. Started clears on 2/4 but leakage from tube again noted On TPN General surgery was consulted for possible surgical closure of the previous G-tube site.  Recommended conservative management and hold for surgical intervention for now.  Moderate to severe protein calorie malnutrition/failure to thrive: Patient was afraid to eat because of pain/leakage of  the gastric contents from the previous feeding tube site. Started on TPN on 01/22/17.  Pharmacy on board.  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 Alvy Bimler .  Not a candidate for chemotherapy now.Was on Gtube which has been removed. Currently he can swallow food but cannot eat due to gastric leakage. Oncology following here.  History of recent CVA: On Plavix.   Coronary artery disease: Status post CABG.  Stable  Hypertension: Currently blood pressure stable.  We will continue to monitor.  Crohn's disease: On IV decadron.  Continue supportive care.  Hypokalemia/hypomagnesemia: Being Supplemented .  We will continue to monitor the levels.  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 We will continue to monitor the counts and transfuse as necessary. S/P 1 U PRBC transfusion 01/30/2016   Generalized weakness: Physical therapy following.    Home health has been ordered.  Complaint of right groin pain on ambulation today.  Will check and order appropriate imaging if necessary.  Hyperglycemia: Continue sliding scale insulin.   Stage 1 pressure ulcer: Present on the coccygeal area.  No drainage, or breakage of skin.  Continue supportive care  Right Groin Pain: Patient complains of right groin pain on ambulation.  Ordered x-ray of the right hip.    DVT prophylaxis: SCD Code Status: Partial code.  No intubation.  CPR okay Family Communication: No family present at the bedside Disposition Plan: Home once cleared by oncology   Consultants: Oncology  Procedures: TPN  Antimicrobials: Ceftriaxone  01/20/17-01/29/17 Vancomycin 01/17/17-01/20/17     Subjective: Patient seen and examined the bedside this morning.  Was complaining of pain on  the right groin on ambulation today also. His previous G-tube site is healing well but he still cannot tolerate food by mouth. It was noted that the insurance will deny his hospital stay so we are  planning to discharge him with TPN with advanced home health to follow as an outpatient.  Case manager working on that.  Objective: Vitals:   02/06/17 0508 02/06/17 1348 02/06/17 2033 02/07/17 0443  BP: 114/74 101/86 121/63 (!) 114/58  Pulse: 64 100 78 (!) 101  Resp: 16 16 16 16   Temp: 98 F (36.7 C) 97.7 F (36.5 C) 98.1 F (36.7 C) 97.9 F (36.6 C)  TempSrc: Oral Oral Oral Oral  SpO2: 99%  98% 98%  Weight:      Height:        Intake/Output Summary (Last 24 hours) at 02/07/2017 1347 Last data filed at 02/07/2017 0443 Gross per 24 hour  Intake 990.74 ml  Output 1000 ml  Net -9.26 ml   Filed Weights   01/21/17 0610 01/28/17 0410 02/04/17 0359  Weight: 67.9 kg (149 lb 11.2 oz) 67.8 kg (149 lb 7.6 oz) 67.9 kg (149 lb 11.1 oz)    Examination:  General exam: Appears calm and comfortable ,Not in distress,thin built, elderly gentleman Respiratory system: Bilateral equal air entry, normal vesicular breath sounds, no wheezes or crackles  Cardiovascular system: S1 & S2 heard, RRR. No JVD, murmurs, rubs, gallops or clicks.  Gastrointestinal system: Abdomen is nondistended, soft and nontender. No organomegaly or masses felt. Normal bowel sounds heard. Healing previous G-tube site,covered with dressing Central nervous system: Alert and oriented. No focal neurological deficits. Extremities: No edema, no clubbing ,no cyanosis, distal peripheral pulses palpable. Skin: No cyanosis,No pallor,No Rash,No Ulcer Psychiatry: Judgement and insight appear normal. Mood & affect appropriate.  GU: No Foley    Data Reviewed: I have personally reviewed following labs and imaging studies  CBC: Recent Labs  Lab 02/02/17 0426 02/03/17 0445 02/04/17 0652 02/05/17 0540 02/06/17 0524  WBC 7.7 6.9 7.7 7.6 7.8  NEUTROABS  --   --  5.9  --   --   HGB 8.7* 8.4* 8.7* 8.4* 8.4*  HCT 25.8* 25.6* 25.4* 25.3* 25.3*  MCV 93.1 94.1 92.7 94.1 92.3  PLT 146* 150 139* 143* 937*   Basic Metabolic  Panel: Recent Labs  Lab 02/01/17 0500 02/02/17 0426 02/03/17 0445 02/04/17 0652 02/05/17 0540 02/06/17 0524 02/07/17 0500  NA 137 136 131* 135 136 134* 135  K 3.7 4.0 3.7 3.8 3.9 3.8 4.1  CL 101 101 98* 100* 101 100* 101  CO2 29 29 26 29 31 29 29   GLUCOSE 147* 128* 148* 122* 163* 177* 167*  BUN 27* 26* 25* 28* 26* 27* 28*  CREATININE 0.69 0.67 0.59* 0.68 0.65 0.63 0.65  CALCIUM 8.9 8.9 8.5* 8.9 8.9 8.9 9.0  MG 1.6* 1.7  --  1.6* 1.8  --  1.6*  PHOS  --  3.7  --  4.0 3.5  --  3.9   GFR: Estimated Creatinine Clearance: 74.3 mL/min (by C-G formula based on SCr of 0.65 mg/dL). Liver Function Tests: Recent Labs  Lab 02/04/17 0652 02/07/17 0500  AST 41 28  ALT 46 34  ALKPHOS 140* 147*  BILITOT 0.4 0.3  PROT 5.5* 5.6*  ALBUMIN 2.4* 2.5*   No results for input(s): LIPASE, AMYLASE in the last 168 hours. No results for input(s): AMMONIA in the last 168 hours. Coagulation Profile: No results for input(s): INR, PROTIME in the last 168  hours. Cardiac Enzymes: No results for input(s): CKTOTAL, CKMB, CKMBINDEX, TROPONINI in the last 168 hours. BNP (last 3 results) No results for input(s): PROBNP in the last 8760 hours. HbA1C: No results for input(s): HGBA1C in the last 72 hours. CBG: Recent Labs  Lab 02/06/17 2029 02/07/17 0111 02/07/17 0441 02/07/17 0802 02/07/17 1141  GLUCAP 107* 195* 166* 127* 138*   Lipid Profile: No results for input(s): CHOL, HDL, LDLCALC, TRIG, CHOLHDL, LDLDIRECT in the last 72 hours. Thyroid Function Tests: No results for input(s): TSH, T4TOTAL, FREET4, T3FREE, THYROIDAB in the last 72 hours. Anemia Panel: No results for input(s): VITAMINB12, FOLATE, FERRITIN, TIBC, IRON, RETICCTPCT in the last 72 hours. Sepsis Labs: No results for input(s): PROCALCITON, LATICACIDVEN in the last 168 hours.  No results found for this or any previous visit (from the past 240 hour(s)).       Radiology Studies: No results found.      Scheduled  Meds: . clopidogrel  75 mg Oral Daily  . dexamethasone  1 mg Intravenous Q24H  . guaiFENesin  600 mg Oral BID  . insulin aspart  0-20 Units Subcutaneous Q4H  . nystatin  5 mL Oral QID  . pantoprazole (PROTONIX) IV  40 mg Intravenous Q24H  . Zinc Oxide   Topical Q4H   Continuous Infusions: . sodium chloride 10 mL/hr at 01/30/17 0410  . famotidine (PEPCID) IV Stopped (02/06/17 2130)  . Marland KitchenTPN (CLINIMIX-E) Adult     And  . fat emulsion    . Marland KitchenTPN (CLINIMIX-E) Adult 83 mL/hr at 02/06/17 1752     LOS: 21 days    Time spent: 25 mins    Benjamine Strout Jodie Echevaria, MD [facility] Pager [phone]  If 7PM-7AM, please contact night-coverage [email or web address] Password [email or web address] 02/07/2017, 1:47 PM

## 2017-02-08 DIAGNOSIS — I951 Orthostatic hypotension: Secondary | ICD-10-CM

## 2017-02-08 LAB — GLUCOSE, CAPILLARY
GLUCOSE-CAPILLARY: 176 mg/dL — AB (ref 65–99)
GLUCOSE-CAPILLARY: 224 mg/dL — AB (ref 65–99)
GLUCOSE-CAPILLARY: 122 mg/dL — AB (ref 65–99)
Glucose-Capillary: 174 mg/dL — ABNORMAL HIGH (ref 65–99)
Glucose-Capillary: 164 mg/dL — ABNORMAL HIGH (ref 65–99)

## 2017-02-08 LAB — MAGNESIUM: MAGNESIUM: 1.9 mg/dL (ref 1.7–2.4)

## 2017-02-08 LAB — BASIC METABOLIC PANEL
ANION GAP: 6 (ref 5–15)
BUN: 29 mg/dL — ABNORMAL HIGH (ref 6–20)
CO2: 31 mmol/L (ref 22–32)
Calcium: 9.4 mg/dL (ref 8.9–10.3)
Chloride: 101 mmol/L (ref 101–111)
Creatinine, Ser: 0.79 mg/dL (ref 0.61–1.24)
GLUCOSE: 166 mg/dL — AB (ref 65–99)
POTASSIUM: 4.2 mmol/L (ref 3.5–5.1)
SODIUM: 138 mmol/L (ref 135–145)

## 2017-02-08 LAB — PHOSPHORUS: PHOSPHORUS: 3.7 mg/dL (ref 2.5–4.6)

## 2017-02-08 MED ORDER — FAT EMULSION 20 % IV EMUL
240.0000 mL | INTRAVENOUS | Status: AC
  Administered 2017-02-08: 240 mL via INTRAVENOUS
  Filled 2017-02-08: qty 250

## 2017-02-08 MED ORDER — MAGNESIUM SULFATE IN D5W 1-5 GM/100ML-% IV SOLN
1.0000 g | Freq: Once | INTRAVENOUS | Status: AC
  Administered 2017-02-08: 1 g via INTRAVENOUS
  Filled 2017-02-08: qty 100

## 2017-02-08 MED ORDER — TRACE MINERALS CR-CU-MN-SE-ZN 10-1000-500-60 MCG/ML IV SOLN
INTRAVENOUS | Status: AC
  Administered 2017-02-08: 18:00:00 via INTRAVENOUS
  Filled 2017-02-08: qty 1992

## 2017-02-08 NOTE — Progress Notes (Signed)
Physical therapist reported to RN that patient got orthostatic hypotensive when he got up to the bathroom.BP was 62/26, saw patient sitting in recliner and patient claims he is feeling better, BP at this time is 124/76. Dr Tawanna Solo was notified via text message,

## 2017-02-08 NOTE — NC FL2 (Signed)
[place] LEVEL OF CARE SCREENING TOOL     IDENTIFICATION  Patient Name: Andres Smith Birthdate: 1940/06/26 Sex: male Admission Date (Current Location): 01/17/2017  [facility] and [place] Number:  Herbalist and Address:  [facility],  [place] [address] [place]      Provider Number: [id number]  Attending Physician Name and Address:  Marene Lenz, MD  Relative Name and Phone Number:       Current Level of Care: Hospital Recommended Level of Care: [place] Prior Approval Number:    Date Approved/Denied: 02/08/17 PASRR Number: [id number]  Discharge Plan: SNF    Current Diagnoses: Patient Active Problem List   Diagnosis Date Noted  . Orthostatic hypotension 02/08/2017  . Hypokalemia 01/29/2017  . Hypomagnesemia 01/29/2017  . Pressure injury of skin 01/28/2017  . Anemia associated with chemotherapy 01/28/2017  . Protein-calorie malnutrition, severe 01/18/2017  . Malfunction of gastrostomy tube ([place]) 12/21/2016  . Deficiency anemia 12/17/2016  . Cachexia ([place]) 12/17/2016  . Acute cardioembolic stroke ([place]) 35/36/1443  . Paroxysmal atrial fibrillation ([place]) 11/29/2016  . Stroke (cerebrum) ([place]) 11/28/2016  . Cancer associated pain 10/23/2016  . Multiple pulmonary nodules 10/10/2016  . Goals of care, counseling/discussion 10/10/2016  . Foot drop, right 08/23/2016  . Acquired pancytopenia ([place]) 06/14/2016  . Weight loss 05/31/2016  . Buccal mucosa squamous cell carcinoma ([place]) 05/10/2016  . B12 deficiency anemia 08/03/2013  . Ulcerative colitis ([place]) 01/17/2010  . Occlusion and stenosis of carotid artery without mention of cerebral infarction 09/20/2009  . GERD 12/02/2008  . Type II diabetes mellitus with manifestations ([place]) 04/27/2008  . Hyperlipidemia with target LDL less than 70 12/04/2007  . Essential hypertension, benign 12/04/2007  . Ankylosing spondylitis ([place]) 12/04/2007  . Coronary  atherosclerosis of native coronary artery 12/04/2007  . 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 Dysphagia 05/06/2007    Orientation RESPIRATION BLADDER Height & Weight     Self, Time, Situation, Place  Normal Continent Weight: 149 lb 11.1 oz (67.9 kg) Height:  5\' 8"  (172.7 cm)  BEHAVIORAL SYMPTOMS/MOOD NEUROLOGICAL BOWEL NUTRITION STATUS      Continent Diet(See dc summary)  AMBULATORY STATUS COMMUNICATION OF NEEDS Skin   Extensive Assist Verbally Other (Comment)(Non pressure wound, abdomen, upper left. )                       Personal Care Assistance Level of Assistance  Bathing, Feeding, Dressing Bathing Assistance: Limited assistance Feeding assistance: Independent Dressing Assistance: Limited assistance     Functional Limitations Info  Sight, Hearing, Speech Sight Info: Adequate Hearing Info: Adequate Speech Info: Adequate    SPECIAL CARE FACTORS FREQUENCY  PT (By licensed PT), OT (By licensed OT)     PT Frequency: 5x/week OT Frequency: 5x/week            Contractures Contractures Info: Not present    Additional Factors Info  Code Status, Allergies Code Status Info: Partial Allergies Info: NKA           Current Medications (02/08/2017):  This is the current hospital active medication list Current Facility-Administered Medications  Medication Dose Route Frequency Provider Last Rate Last Dose  . 0.9 %  sodium chloride infusion   Intravenous Continuous Jodie Echevaria, Amrit, MD 125 mL/hr at 02/08/17 1406    . acetaminophen (TYLENOL) tablet 650 mg  650 mg Oral Q6H PRN Amin, Jeanella Flattery, MD       Or  . acetaminophen (TYLENOL)  suppository 650 mg  650 mg Rectal Q6H PRN Amin, Ankit Chirag, MD      . albuterol (PROVENTIL) (2.5 MG/3ML) 0.083% nebulizer solution 2.5 mg  2.5 mg Nebulization Q6H PRN Amin, Ankit Chirag, MD      . clopidogrel (PLAVIX) tablet 75 mg  75 mg Oral Daily Starla Link, Kshitiz, MD   75 mg at 02/08/17 0952  . dexamethasone (DECADRON) injection  1 mg  1 mg Intravenous Q24H [name], MD   1 mg at 02/08/17 0952  . famotidine (PEPCID) IVPB 20 mg premix  20 mg Intravenous Q24H [name], MD   Stopped at 02/07/17 2321  . TPN (CLINIMIX-E) Adult   Intravenous Continuous TPN Pham, Anh P, RPH       And  . fat emulsion 20 % infusion 240 mL  240 mL Intravenous Continuous TPN Pham, Anh P, RPH      . guaiFENesin (MUCINEX) 12 hr tablet 600 mg  600 mg Oral BID Adhikari Bk, Amrit, MD      . insulin aspart (novoLOG) injection 0-20 Units  0-20 Units Subcutaneous Q4H Shade, Haze Justin, RPH   4 Units at 02/08/17 1253  . morphine 2 MG/ML injection 1 mg  1 mg Intravenous Q3H PRN Aline August, MD   1 mg at 02/06/17 1357  . nystatin (MYCOSTATIN) 100000 UNIT/ML suspension 500,000 Units  5 mL Oral QID Vertis Kelch, NP   500,000 Units at 02/08/17 [phone]  . ondansetron (ZOFRAN) tablet 4 mg  4 mg Oral Q6H PRN Amin, Ankit Chirag, MD   4 mg at 02/02/17 0436   Or  . ondansetron (ZOFRAN) injection 4 mg  4 mg Intravenous Q6H PRN Amin, Ankit Chirag, MD      . pantoprazole (PROTONIX) injection 40 mg  40 mg Intravenous Q24H Alvy Bimler, Ni, MD   40 mg at 02/08/17 0952  . sodium chloride flush (NS) 0.9 % injection 10-40 mL  10-40 mL Intracatheter PRN Jodie Echevaria, Amrit, MD   10 mL at 01/28/17 1812  . TPN (CLINIMIX-E) Adult   Intravenous Continuous TPN Aedin, Jeansonne, RPH 83 mL/hr at 02/07/17 1805    . Zinc Oxide (TRIPLE PASTE) 12.8 % ointment   Topical Q4H Robbie Lis, MD         Discharge Medications: Please see discharge summary for a list of discharge medications.  Relevant Imaging Results:  Relevant Lab Results:   Additional Information ssn: [id number]  Servando Snare, LCSW

## 2017-02-08 NOTE — Progress Notes (Addendum)
Physical Therapy Treatment Patient Details Name: Andres Smith MRN: [id number] DOB: 01/10/1940 Today's Date: 02/08/2017    History of Present Illness 77 year old male with past medical history of buccal mucosa squamous cell cancer, dysphagia with G-tube in place, atrial fibrillation, coronary artery disease status post CABG, recent CVA was admitted for the evaluation of malfunction of his G-tube and possible abdominal cellulitis.    PT Comments    Pt OOB in straight back chair with NT in room changing bed.  Son also present.  Pt declined to walk at first with noted decreased WBing tolerance thru R LE.  MAX encouragement just to walk around bed to recliner.  Pt c/o dizziness and ABD pain.  Assisted to recliner when pt stated he needed to have a bowel movement.  Assisted with amb to bathroom with less resistance.  Very unsteady gait with def need for walker support R LE hip pain.  Pt did have a BM and was able to perform self peri care but when assisted back to recliner pt c/o even more dizziness and ABD pain to point he became SOB.   Vitals Sitting in recliner                   BP  115/88, HR 74, RA 97% Sitting on toilet                       BP 99/72, HR 78, RA 98% Standing after 3 min              BP  62/26, HR 110, RA 99% max c/o dizziness     RN called to room Supine in recliner                   BP 124/79, HR 88, RA 99%  Spoke to son about prior mobility level and assist at home.  Pt lives with spouse and did not need any AD to get around.  Pt amb with PT on 02/04/17 400 feet pushing IV pole.  This session very different. Very limited activity tolerance and orthostatic.    Follow Up Recommendations  Significant decline...the patient will need ST Rehab at SNF prior to safely returning home Will update LPT  Equipment Recommendations  Rolling walker with 5" wheels    Recommendations for Other Services       Precautions / Restrictions Precautions Precaution Comments: NPO TPN   Restrictions Weight Bearing Restrictions: No    Mobility  Bed Mobility               General bed mobility comments: OOB with NT  Transfers Overall transfer level: Needs assistance Equipment used: None;Rolling walker (2 wheeled) Transfers: Sit to/from [employer] Transfers Sit to Stand: Min guard;Min assist Stand pivot transfers: Min guard;Min assist       General transfer comment: 50% VC's for safety with turns and hand placement esp with stand to sit  Ambulation/Gait Ambulation/Gait assistance: Min assist Ambulation Distance (Feet): 26 Feet(8 feet, 9 feet, 9 feet ) Assistive device: Rolling walker (2 wheeled) Gait Pattern/deviations: Step-to pattern;Decreased step length - left;Decreased stance time - right;Staggering left;Trunk flexed Gait velocity: decreased   General Gait Details: MAX encouragement to participate with son assisting as well to encourage.  Amb three short distances. 1.  from window bench to recliner.  2. from recliner to bathroom.  3. from bathroom to recliner when c/o MAX dizziness and VERY unsteady gait.  Also increased c/o ABD pain  and stating "I can't catch my breath".     Stairs            Wheelchair Mobility    Modified Rankin (Stroke Patients Only)       Balance                                            Cognition Arousal/Alertness: Awake/alert Behavior During Therapy: WFL for tasks assessed/performed Overall Cognitive Status: Within Functional Limits for tasks assessed                                        Exercises      General Comments        Pertinent Vitals/Pain Pain Assessment: Faces Pain Location: ABD with activity Pain Descriptors / Indicators: Grimacing Pain Intervention(s): Monitored during session    Home Living                      Prior Function            PT Goals (current goals can now be found in the care plan section) Progress towards PT goals:  Progressing toward goals    Frequency    Min 3X/week      PT Plan Current plan remains appropriate    Co-evaluation              AM-PAC PT "6 Clicks" Daily Activity  Outcome Measure  Difficulty turning over in bed (including adjusting bedclothes, sheets and blankets)?: A Little Difficulty moving from lying on back to sitting on the side of the bed? : A Little Difficulty sitting down on and standing up from a chair with arms (e.g., wheelchair, bedside commode, etc,.)?: A Little Help needed moving to and from a bed to chair (including a wheelchair)?: A Little Help needed walking in hospital room?: A Little Help needed climbing 3-5 steps with a railing? : A Lot 6 Click Score: 17    End of Session Equipment Utilized During Treatment: Gait belt Activity Tolerance: Other (comment)(max c/o dizziness and drop in BP ) Patient left: in chair;with family/visitor present;with call bell/phone within reach;with chair alarm set;with nursing/sitter in room Nurse Communication: Mobility status(drop in BP) PT Visit Diagnosis: Other abnormalities of gait and mobility (R26.89);History of falling (Z91.81);Difficulty in walking, not elsewhere classified (R26.2);Pain Pain - Right/Left: Right Pain - part of body: Hip     Time: 9024-0973 PT Time Calculation (min) (ACUTE ONLY): 36 min  Charges:  $Gait Training: 8-22 mins $Therapeutic Activity: 8-22 mins                    G Codes:       Rica Koyanagi  PTA [facility]  Acute  Rehab Pager      [phone]

## 2017-02-08 NOTE — Progress Notes (Signed)
[place] NOTE   Pharmacy Consult for TPN Indication: unable to tolerate PO intake d/t PEG tube site leakage  Patient Measurements: Body mass index is 22.76 kg/m. Filed Weights   01/21/17 0610 01/28/17 0410 02/04/17 0359  Weight: 149 lb 11.2 oz (67.9 kg) 149 lb 7.6 oz (67.8 kg) 149 lb 11.1 oz (67.9 kg)  Ideal Body Weight:  70 kg  HPI:  32 yoM admitted on 1/17 with c/o G-tube leaking blood and gastric contents around exit site causing erosion of surrounding skin.  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  IR converted to larger G-J tube on 1/15, but it has continued to leak.  On 1/19 the [place] tube was permanently removed without complications.  He continues to have leakage through the non-healing tube site and is therefore unable to meet nutrition goals with oral diet. Pharmacy is consulted for trial of short-term TPN to provide nutrition and avoid oral intake that is exacerbating the healing process around his feeding tube site.  Significant events:  1/28 consumed water with leakage at previous G tube site - feeding tube site has not healed, but is improving.  To continue TPN. 2/4 feeding tube site still leaking 2/5 surgery recs to continue TPN for another 1-2 weeks to give Gtube site time to heal  Insulin requirements past 24 hours: 27 units frin rSSI in 24hrs - PTA metformin 500mg  BID, chronic prednisone 5mg .  Currently on Dexamethasone IV.  Current Nutrition: NPO   IVF: NS at 10 ml/hr  Central access: Implanted port (05/21/16) TPN start date: 01/22/17  ASSESSMENT                                                                                                          Today:   Glucose:  goal range < 150.  121-271 (2 readings in 270s, others are at or close to goal range) with rSSI and 45 units of insulin in TPN.    PTA:  Metformin 500mg  BID,  chronic prednisone 5mg   Started dexamethasone IV while NPO.  Reduced from 2mg  (1/23-1/29) to 1mg  (1/29 -   )  Electrolytes:  Mag improved to 1.9 (s/p 2gm Mag sulfate on 2/7); other lytes ok  Renal:  Stable, Scr wnl  LFTs:  Low/stable  TGs:  91 (1/23) 60 (1/28) 42 (2/4)  Prealbumin:  <5 (1/23), 14.5 (1/28)   NUTRITIONAL GOALS                                                                                             RD recs (2/4):  110-120 Protein, 2350-2550 Kcal  Clinimix 5/20 at a goal rate of 83 ml/hr + 20% fat emulsion 240 ml/day to provide: 100 g of protein and 2233 kCals per day meeting 91% of protein and 95% of kCal needs  - Glucose infusion rate will be 4.07 mg/kg/min (Maximum 5 mg/kg/min)   PLAN                                                                                                           Now:  - Magnesium sulfate 1 gm IV x1  At 1800 today:  Continue Clinimix E 5/20 at 83 ml/hr (goal rate)  TPN to contain standard multivitamins and trace element daily.  Continue regular insulin at 45 units  in TPN / 24 hrs.   20% fat emulsion at 20 ml/hr x12 hours/day  IVF at 10 ml/hr.  Continue CBGs and resistant SSI q4h.   Mag level in am  TPN lab panels on Mondays & Thursdays.   Dia Sitter, PharmD, BCPS 02/08/2017 8:49 AM

## 2017-02-08 NOTE — Progress Notes (Signed)
Per IV team, huber needle cannot be changed tonight since TPN is running. It would waste the whole bag.  Needle must be changed tomorrow before next bag of TPN starts. Will pass this on to the day shift nurse.

## 2017-02-08 NOTE — Progress Notes (Signed)
PROGRESS NOTE    Andres Smith  KYH:[id number] DOB: [birth year] DOA: 01/17/2017 PCP: No primary care provider on file.   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 G-tube was removed.  Abdominal wall cellulitis around the previous G tube site  was being treated with antibiotics.  Currently he is on TPN as he is unable to tolerate by mouth because when he starts eating, there is leakage of gastric contents  from the previous G tube site. Hoping for closure of the G-tube site so that patient can be started on oral feeding.   Assessment & Plan:   Principal Problem:   Malfunction of gastrostomy tube ([place]) Active Problems:   CROHN'S DISEASE-LARGE INTESTINE   Protein-calorie malnutrition, severe   Pressure injury of skin   Anemia associated with chemotherapy   Hypokalemia   Hypomagnesemia   Orthostatic hypotension  G-tube malfunction with abdominal cellulitis: S/P G-tube removal by IR. Cultures were negative.He finished the antibiotics course . Continue wound care around the area of the cellulitis Wound care following.  Continue PPI Hoping for the healing of the leak and starting on clear liquid diet soon On 01/28/17 ,we  checked the leakage from previous G tube site  at the bedside.  He was given a glass of water through mouth and immediately after 1-2 minutes the water started flowing from his previous G-tube site. Started clears on 2/4 but leakage from tube again noted On TPN General surgery was consulted for possible surgical closure of the previous G-tube site.  Recommended conservative management and hold for surgical intervention for now.  Orthostatic hypotension: Started on IV fluids.  Moderate to severe protein calorie  malnutrition/failure to thrive: Patient was afraid to eat because of pain/leakage of the gastric contents from the previous feeding tube site. Started on TPN on 01/22/17.  Pharmacy on board.  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 Alvy Bimler .  Not a candidate for chemotherapy now.Was on Gtube which has been removed. Currently he can swallow food but cannot eat due to gastric leakage. Oncology following here.  History of recent CVA: On Plavix.   Coronary artery disease: Status post CABG.  Stable  Hypertension: Currently blood pressure stable.  We will continue to monitor.  Crohn's disease: On IV decadron.  Continue supportive care.  Hypokalemia/hypomagnesemia: Being Supplemented .  We will continue to monitor the levels.  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 We will continue to monitor the counts and transfuse as necessary. S/P 1 U PRBC transfusion 01/30/2016   Generalized weakness: Physical therapy following.    Home health has been ordered.    Hyperglycemia: Continue sliding scale insulin.   Stage 1 pressure ulcer: Present on the coccygeal area.  No drainage, or breakage of skin.  Continue supportive care  Right Groin Pain: Patient complains of right groin pain on ambulation.  Ordered x-ray of the right hip.  Did not show any fractures or dislocation but just showed changes consistent with ankylosing spondylitis .patient has history of ankylosing spondylitis      DVT prophylaxis: SCD Code Status: Partial code.  No intubation.  CPR okay Family Communication: Updated the current situation and plan with the son Disposition Plan: Home . It was noted that the insurance will deny his hospital stay so we are  planning to discharge him with TPN with advanced home health to follow as an outpatient.  Case manager working on that.   Consultants: Oncology  Procedures: TPN  Antimicrobials: Ceftriaxone   01/20/17-01/29/17 Vancomycin 01/17/17-01/20/17     Subjective: Patient seen and examined the bedside this morning.  Still complains of right groin pain on ambulation.  Noted to be orthostatic today.  Objective: Vitals:   02/07/17 0443 02/07/17 1551 02/07/17 2155 02/08/17 0600  BP: (!) 114/58 128/66 118/68 124/85  Pulse: (!) 101 60 (!) 58 97  Resp: 16 18 20 20   Temp: 97.9 F (36.6 C) 97.8 F (36.6 C) 97.8 F (36.6 C) 97.6 F (36.4 C)  TempSrc: Oral Oral Oral Oral  SpO2: 98% 100% 99% 99%  Weight:      Height:        Intake/Output Summary (Last 24 hours) at 02/08/2017 1347 Last data filed at 02/08/2017 0334 Gross per 24 hour  Intake 2798.78 ml  Output 1450 ml  Net 1348.78 ml   Filed Weights   01/21/17 0610 01/28/17 0410 02/04/17 0359  Weight: 67.9 kg (149 lb 11.2 oz) 67.8 kg (149 lb 7.6 oz) 67.9 kg (149 lb 11.1 oz)    Examination:  General exam: Appears calm and comfortable ,Not in distress, cachectic elderly gentleman Respiratory system: Bilateral equal air entry, normal vesicular breath sounds, no wheezes or crackles  Cardiovascular system: S1 & S2 heard, RRR. No JVD, murmurs, rubs, gallops or clicks.  Gastrointestinal system: Abdomen is nondistended, soft and nontender. No organomegaly or masses felt. Normal bowel sounds heard. Healing previous G-tube site,covered with dressing Central nervous system: Alert and oriented. No focal neurological deficits. Extremities: No edema, no clubbing ,no cyanosis, distal peripheral pulses palpable. Skin: No cyanosis,No pallor,No Rash,No Ulcer Psychiatry: Judgement and insight appear normal. Mood & affect appropriate.  GU: No Foley   Data Reviewed: I have personally reviewed following labs and imaging studies  CBC: Recent Labs  Lab 02/02/17 0426 02/03/17 0445 02/04/17 0652 02/05/17 0540 02/06/17 0524  WBC 7.7 6.9 7.7 7.6 7.8  NEUTROABS  --   --  5.9  --   --   HGB 8.7* 8.4* 8.7* 8.4* 8.4*  HCT 25.8* 25.6* 25.4* 25.3* 25.3*   MCV 93.1 94.1 92.7 94.1 92.3  PLT 146* 150 139* 143* 401*   Basic Metabolic Panel: Recent Labs  Lab 02/02/17 0426  02/04/17 0652 02/05/17 0540 02/06/17 0524 02/07/17 0500 02/08/17 0728  NA 136   < > 135 136 134* 135 138  K 4.0   < > 3.8 3.9 3.8 4.1 4.2  CL 101   < > 100* 101 100* 101 101  CO2 29   < > 29 31 29 29 31   GLUCOSE 128*   < > 122* 163* 177* 167* 166*  BUN 26*   < > 28* 26* 27* 28* 29*  CREATININE 0.67   < > 0.68 0.65 0.63 0.65 0.79  CALCIUM 8.9   < > 8.9 8.9 8.9 9.0 9.4  MG 1.7  --  1.6* 1.8  --  1.6* 1.9  PHOS 3.7  --  4.0 3.5  --  3.9 3.7   < > = values in this interval not displayed.   GFR: Estimated Creatinine Clearance: 74.3 mL/min (by C-G formula based on SCr of 0.79 mg/dL). Liver Function Tests: Recent Labs  Lab 02/04/17 0652 02/07/17 0500  AST 41 28  ALT 46 34  ALKPHOS 140* 147*  BILITOT 0.4 0.3  PROT 5.5* 5.6*  ALBUMIN 2.4* 2.5*   No results for input(s): LIPASE, AMYLASE in the last 168 hours. No results for input(s): AMMONIA in the last 168 hours. Coagulation Profile: No results for input(s): INR, PROTIME in the last 168 hours. Cardiac Enzymes: No results for input(s): CKTOTAL, CKMB, CKMBINDEX, TROPONINI in the last 168 hours. BNP (last 3 results) No results for input(s): PROBNP in the last 8760 hours. HbA1C: No results for input(s): HGBA1C in the last 72 hours. CBG: Recent Labs  Lab 02/07/17 2049 02/08/17 0003 02/08/17 0417 02/08/17 0741 02/08/17 1159  GLUCAP 274* 121* 174* 164* 176*   Lipid Profile: No results for input(s): CHOL, HDL, LDLCALC, TRIG, CHOLHDL, LDLDIRECT in the last 72 hours. Thyroid Function Tests: No results for input(s): TSH, T4TOTAL, FREET4, T3FREE, THYROIDAB in the last 72 hours. Anemia Panel: No results for input(s): VITAMINB12, FOLATE, FERRITIN, TIBC, IRON, RETICCTPCT in the last 72 hours. Sepsis Labs: No results for input(s): PROCALCITON, LATICACIDVEN in the last 168 hours.  No results found for this or  any previous visit (from the past 240 hour(s)).       Radiology Studies: Dg Hip Unilat With Pelvis 2-3 Views Right  Result Date: 02/07/2017 CLINICAL DATA:  Right hip pain. EXAM: DG HIP (WITH OR WITHOUT PELVIS) 2-3V RIGHT COMPARISON:  CT abdomen pelvis dated May 21, 2016. FINDINGS: No acute fracture or malalignment. The bilateral hip joint spaces are preserved. 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 No focal bone lesion. Osteopenia. Soft tissues are unremarkable. IMPRESSION: 1. No acute osseous abnormality or significant degenerative changes. 2. 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 Electronically Signed   By: Titus Dubin M.D.   On: 02/07/2017 15:18        Scheduled Meds: . clopidogrel  75 mg Oral Daily  . dexamethasone  1 mg Intravenous Q24H  . guaiFENesin  600 mg Oral BID  . insulin aspart  0-20 Units Subcutaneous Q4H  . nystatin  5 mL Oral QID  . pantoprazole (PROTONIX) IV  40 mg Intravenous Q24H  . Zinc Oxide   Topical Q4H   Continuous Infusions: . sodium chloride 10 mL/hr at 01/30/17 0410  . famotidine (PEPCID) IV Stopped (02/07/17 2321)  . Marland KitchenTPN (CLINIMIX-E) Adult     And  . fat emulsion    . Marland KitchenTPN (CLINIMIX-E) Adult 83 mL/hr at 02/07/17 1805     LOS: 22 days    Time spent: 25 mins    Idil Maslanka Jodie Echevaria, MD [facility] Pager [phone]  If 7PM-7AM, please contact night-coverage [email or web address] Password [email or web address] 02/08/2017, 1:47 PM

## 2017-02-08 NOTE — Progress Notes (Signed)
LCSW following for SNF placement.   Per RNCM patient and family are agreeable to SNF at dc and give permission to fax information to facilities.    LCSW attempted to meet bedside with patient. Patient was asleep and no family present.   Patient will need pre authorization.   LCSW will continue to follow.   Carolin Coy [place]

## 2017-02-08 NOTE — Progress Notes (Addendum)
This CM was asked to meet with family at bedside to answer some questions. PT recently re-evaluated pt and changed recommendation to SNF. This CM alerted CSW and CSW to fax out for SNF bed options. Wife still unsure if they will chose home or SNF. If home, then [facility] is following and will just need orders for home TPN and home health services. At this time pt is having orthostatic hypotension and is needing some IVF. CM will continue to follow. Marney Doctor RN,BSN,NCM [phone]

## 2017-02-08 NOTE — Progress Notes (Signed)
Nutrition Follow-up  DOCUMENTATION CODES:   Severe malnutrition in context of chronic illness  INTERVENTION:   TPN per Pharmacy Will monitor for diet advancement  NUTRITION DIAGNOSIS:   Severe Malnutrition related to chronic illness, cancer and cancer related treatments as evidenced by percent weight loss, moderate fat depletion, severe fat depletion, severe muscle depletion.  Ongoing.  GOAL:   Patient will meet greater than or equal to 90% of their needs  Meeting with TPN.  MONITOR:   Weight trends, Labs, Skin(TPN)  ASSESSMENT:   Pt with PMH significant for buccal mucosa squamous cell carcinoma (chemo/radiation), dysphagia, s/p PEG placement May 2018, CAD, DM, HLD, and HTN. Presents this admission with PEG tube malfunction. Surgery notes pt had PEG tube upsize from 40F to 27F 1/15, but it continues to leak around the site.   Pt now on day 17 of TPN: Clinimix E 5/20 @ 83 ml/hr with 20% ILE @ 20 ml/hr x 12 hours. Pt had trial of PO intake on 2/4 with no success, site is still leaking. Pt continues to be NPO.  Per MD note, pt to d/c on home TPN. Will continue to monitor progress and plans.  Medications: IV Decadron, IV Mg sulfate once Labs reviewed: CBGs: 164-174  Diet Order:  Diet NPO time specified TPN (CLINIMIX-E) Adult TPN (CLINIMIX-E) Adult  EDUCATION NEEDS:   Education needs have been addressed  Skin:  Skin Assessment: Skin Integrity Issues: Skin Integrity Issues:: Other (Comment), Stage II Stage II: sacrum Other: non-pressure abdominal wound  Last BM:  2/2  Height:   Ht Readings from Last 1 Encounters:  01/17/17 5\' 8"  (1.727 m)    Weight:   Wt Readings from Last 1 Encounters:  02/04/17 149 lb 11.1 oz (67.9 kg)    Ideal Body Weight:  70 kg  BMI:  Body mass index is 22.76 kg/m.  Estimated Nutritional Needs:   Kcal:  3893-7342 kcal/day  Protein:  110-120 g/day  Fluid:  >2.3 L/day  Clayton Bibles, MS, RD, LDN [place] [facility] Pager: [phone] After Hours Pager: [phone]

## 2017-02-09 LAB — GLUCOSE, CAPILLARY
Glucose-Capillary: 123 mg/dL — ABNORMAL HIGH (ref 65–99)
Glucose-Capillary: 132 mg/dL — ABNORMAL HIGH (ref 65–99)
Glucose-Capillary: 133 mg/dL — ABNORMAL HIGH (ref 65–99)
Glucose-Capillary: 151 mg/dL — ABNORMAL HIGH (ref 65–99)
Glucose-Capillary: 195 mg/dL — ABNORMAL HIGH (ref 65–99)
Glucose-Capillary: 195 mg/dL — ABNORMAL HIGH (ref 65–99)

## 2017-02-09 LAB — MAGNESIUM: Magnesium: 1.6 mg/dL — ABNORMAL LOW (ref 1.7–2.4)

## 2017-02-09 MED ORDER — TRACE MINERALS CR-CU-MN-SE-ZN 10-1000-500-60 MCG/ML IV SOLN
INTRAVENOUS | Status: AC
  Administered 2017-02-09: 18:00:00 via INTRAVENOUS
  Filled 2017-02-09: qty 1992

## 2017-02-09 MED ORDER — FAT EMULSION 20 % IV EMUL
240.0000 mL | INTRAVENOUS | Status: AC
  Administered 2017-02-09: 240 mL via INTRAVENOUS
  Filled 2017-02-09: qty 250

## 2017-02-09 MED ORDER — MIDODRINE HCL 5 MG PO TABS
5.0000 mg | ORAL_TABLET | Freq: Three times a day (TID) | ORAL | Status: DC
  Administered 2017-02-09 – 2017-02-13 (×10): 5 mg via ORAL
  Filled 2017-02-09 (×13): qty 1

## 2017-02-09 MED ORDER — MAGNESIUM SULFATE 2 GM/50ML IV SOLN
2.0000 g | Freq: Once | INTRAVENOUS | Status: AC
  Administered 2017-02-09: 2 g via INTRAVENOUS
  Filled 2017-02-09: qty 50

## 2017-02-09 NOTE — Progress Notes (Signed)
PROGRESS NOTE    Andres Smith  TFT:[id number] DOB: 1940/12/14 DOA: 01/17/2017 PCP: No primary care provider on file.   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 G-tube was removed.  Abdominal wall cellulitis around the previous G tube site  was being treated with antibiotics.  Currently he is on TPN as he is unable to tolerate by mouth because when he starts eating, there is leakage of gastric contents  from the previous G tube site. Hoping for closure of the G-tube site so that patient can be started on oral feeding.   Assessment & Plan:   Principal Problem:   Malfunction of gastrostomy tube ([place]) Active Problems:   CROHN'S DISEASE-LARGE INTESTINE   Protein-calorie malnutrition, severe   Pressure injury of skin   Anemia associated with chemotherapy   Hypokalemia   Hypomagnesemia   Orthostatic hypotension  G-tube malfunction with abdominal cellulitis: S/P G-tube removal by IR. Cultures were negative.He finished the antibiotics course . Continue wound care around the area of the cellulitis Wound care following.  Continue PPI Hoping for the healing of the leak and starting on clear liquid diet soon On 01/28/17 ,we  checked the leakage from previous G tube site  at the bedside.  He was given a glass of water through mouth and immediately after 1-2 minutes the water started flowing from his previous G-tube site. Started clears on 2/4 but leakage from tube again noted On TPN General surgery was consulted for possible surgical closure of the previous G-tube site.  Recommended conservative management and hold for surgical intervention for now. Leak site healing very well.  Orthostatic hypotension: Started on IV fluids.  Patient was still  orthostatic this morning.  Continue IV fluids.  Started on Midodrin  Moderate to severe protein calorie malnutrition/failure to thrive: Patient was afraid to eat because of pain/leakage of the gastric contents from the previous feeding tube site. Started on TPN on 01/22/17.  Pharmacy on board.  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 Alvy Bimler .  Not a candidate for chemotherapy now.Was on Gtube which has been removed. Currently he can swallow food but cannot eat due to gastric leakage. Oncology was following here.  History of recent CVA: On Plavix.   Coronary artery disease: Status post CABG.  Stable  Hypertension: Currently blood pressure stable.  We will continue to monitor.  Crohn's disease: On IV decadron.  Continue supportive care.  Hypokalemia/hypomagnesemia: Being Supplemented .  We will continue to monitor the levels.  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 We will continue to monitor the counts and transfuse as necessary. S/P 1 U PRBC transfusion 01/30/2016   Generalized weakness: Physical therapy following.    Patient has been planned to be discharged to skilled nursing facility.  Hyperglycemia: Continue sliding scale insulin.   Stage 1 pressure ulcer: Present on the coccygeal area.  No drainage, or breakage of skin.  Continue supportive care  Right Groin Pain: Patient complained of right groin pain on ambulation.  Ordered x-ray of the right hip.  Did not show any fractures or dislocation but just showed changes consistent with ankylosing spondylitis .Patient has history of ankylosing spondylitis . Pain has improved this morning     DVT prophylaxis: SCD Code Status: Partial code.  No intubation.  CPR  okay Family Communication: Updated the current situation and plan with the son Disposition Plan: SNF likely tomorrow.  Patient was given skilled nursing facility option today but he/family  Refused.  Requested for  evaluation by physical therapy. Hopefully he can be discharged tomorrow.  Consultants: Oncology  Procedures: TPN  Antimicrobials: Ceftriaxone  01/20/17-01/29/17 Vancomycin 01/17/17-01/20/17     Subjective: Patient seen and examined the bedside this morning.  Remains comfortable.  Groin pain has improved.  Orthostatic vitals are still positive.  Objective: Vitals:   02/08/17 0600 02/08/17 1640 02/08/17 2354 02/09/17 0710  BP: 124/85 128/78 122/68 119/65  Pulse: 97 75 (!) 107 69  Resp: 20 18 16 16   Temp: 97.6 F (36.4 C) 97.8 F (36.6 C) 97.8 F (36.6 C) 98.7 F (37.1 C)  TempSrc: Oral Oral Oral Oral  SpO2: 99% 96% 100% 99%  Weight:      Height:        Intake/Output Summary (Last 24 hours) at 02/09/2017 1254 Last data filed at 02/09/2017 0425 Gross per 24 hour  Intake 1766.21 ml  Output 1050 ml  Net 716.21 ml   Filed Weights   01/21/17 0610 01/28/17 0410 02/04/17 0359  Weight: 67.9 kg (149 lb 11.2 oz) 67.8 kg (149 lb 7.6 oz) 67.9 kg (149 lb 11.1 oz)    Examination:  General exam: Appears calm and comfortable ,Not in distress,average built Respiratory system: Bilateral equal air entry, normal vesicular breath sounds, no wheezes or crackles  Cardiovascular system: S1 & S2 heard, RRR. No JVD, murmurs, rubs, gallops or clicks.  Gastrointestinal system: Abdomen is nondistended, soft and nontender. No organomegaly or masses felt. Normal bowel sounds heard. Healing previous G-tube site,covered with dressing Central nervous system: Alert and oriented. No focal neurological deficits. Extremities: No edema, no clubbing ,no cyanosis, distal peripheral pulses palpable. Skin: No cyanosis,No pallor,No Rash,No Ulcer Psychiatry: Judgement and insight appear normal. Mood & affect appropriate.  GU: No Foley  Data Reviewed: I have personally reviewed following labs and imaging studies  CBC: Recent Labs  Lab 02/03/17 0445 02/04/17 0652 02/05/17 0540 02/06/17 0524  WBC 6.9 7.7  7.6 7.8  NEUTROABS  --  5.9  --   --   HGB 8.4* 8.7* 8.4* 8.4*  HCT 25.6* 25.4* 25.3* 25.3*  MCV 94.1 92.7 94.1 92.3  PLT 150 139* 143* 347*   Basic Metabolic Panel: Recent Labs  Lab 02/04/17 0652 02/05/17 0540 02/06/17 0524 02/07/17 0500 02/08/17 0728 02/09/17 0500  NA 135 136 134* 135 138  --   K 3.8 3.9 3.8 4.1 4.2  --   CL 100* 101 100* 101 101  --   CO2 29 31 29 29 31   --   GLUCOSE 122* 163* 177* 167* 166*  --   BUN 28* 26* 27* 28* 29*  --   CREATININE 0.68 0.65 0.63 0.65 0.79  --   CALCIUM 8.9 8.9 8.9 9.0 9.4  --   MG 1.6* 1.8  --  1.6* 1.9 1.6*  PHOS 4.0 3.5  --  3.9 3.7  --    GFR: Estimated Creatinine Clearance: 74.3 mL/min (by C-G formula based on SCr of 0.79 mg/dL). Liver Function Tests: Recent Labs  Lab 02/04/17 0652 02/07/17 0500  AST 41 28  ALT 46 34  ALKPHOS 140* 147*  BILITOT 0.4 0.3  PROT 5.5* 5.6*  ALBUMIN 2.4* 2.5*   No results for input(s): LIPASE, AMYLASE in the last 168 hours. No results for input(s): AMMONIA in the last 168 hours. Coagulation  Profile: No results for input(s): INR, PROTIME in the last 168 hours. Cardiac Enzymes: No results for input(s): CKTOTAL, CKMB, CKMBINDEX, TROPONINI in the last 168 hours. BNP (last 3 results) No results for input(s): PROBNP in the last 8760 hours. HbA1C: No results for input(s): HGBA1C in the last 72 hours. CBG: Recent Labs  Lab 02/08/17 2100 02/09/17 0012 02/09/17 0423 02/09/17 0816 02/09/17 1236  GLUCAP 122* 123* 133* 132* 195*   Lipid Profile: No results for input(s): CHOL, HDL, LDLCALC, TRIG, CHOLHDL, LDLDIRECT in the last 72 hours. Thyroid Function Tests: No results for input(s): TSH, T4TOTAL, FREET4, T3FREE, THYROIDAB in the last 72 hours. Anemia Panel: No results for input(s): VITAMINB12, FOLATE, FERRITIN, TIBC, IRON, RETICCTPCT in the last 72 hours. Sepsis Labs: No results for input(s): PROCALCITON, LATICACIDVEN in the last 168 hours.  No results found for this or any previous  visit (from the past 240 hour(s)).       Radiology Studies: Dg Hip Unilat With Pelvis 2-3 Views Right  Result Date: 02/07/2017 CLINICAL DATA:  Right hip pain. EXAM: DG HIP (WITH OR WITHOUT PELVIS) 2-3V RIGHT COMPARISON:  CT abdomen pelvis dated May 21, 2016. FINDINGS: No acute fracture or malalignment. The bilateral hip joint spaces are preserved. 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 No focal bone lesion. Osteopenia. Soft tissues are unremarkable. IMPRESSION: 1. No acute osseous abnormality or significant degenerative changes. 2. 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 Electronically Signed   By: Titus Dubin M.D.   On: 02/07/2017 15:18        Scheduled Meds: . clopidogrel  75 mg Oral Daily  . dexamethasone  1 mg Intravenous Q24H  . guaiFENesin  600 mg Oral BID  . insulin aspart  0-20 Units Subcutaneous Q4H  . midodrine  5 mg Oral TID WC  . nystatin  5 mL Oral QID  . pantoprazole (PROTONIX) IV  40 mg Intravenous Q24H  . Zinc Oxide   Topical Q4H   Continuous Infusions: . sodium chloride 125 mL/hr at 02/08/17 2254  . famotidine (PEPCID) IV Stopped (02/08/17 2144)  . Marland KitchenTPN (CLINIMIX-E) Adult     And  . fat emulsion    . Marland KitchenTPN (CLINIMIX-E) Adult 83 mL/hr at 02/08/17 1736     LOS: 23 days    Time spent: 25 mins    Nyanna Heideman Jodie Echevaria, MD [facility] Pager [phone]  If 7PM-7AM, please contact night-coverage [email or web address] Password [email or web address] 02/09/2017, 12:54 PM

## 2017-02-09 NOTE — Progress Notes (Signed)
CSW contacted by patient's insurance ([place]) and informed that patient has received insurance authorization and it will be completed after SNF selection is made.  CSW provided update to patient's family, patient's son reported that they are waiting PT to discuss [facility]; prior to making a decision regarding SNF.  CSW will continue to follow and assist with discharge planning.  Abundio Miu, [employer] Social Worker [facility] Cell#: [phone]

## 2017-02-09 NOTE — Progress Notes (Signed)
[place] NOTE   Pharmacy Consult for TPN Indication: unable to tolerate PO intake d/t PEG tube site leakage  Patient Measurements: Body mass index is 22.76 kg/m. Filed Weights   01/21/17 0610 01/28/17 0410 02/04/17 0359  Weight: 149 lb 11.2 oz (67.9 kg) 149 lb 7.6 oz (67.8 kg) 149 lb 11.1 oz (67.9 kg)  Ideal Body Weight:  70 kg  HPI:  56 yoM admitted on 1/17 with c/o G-tube leaking blood and gastric contents around exit site causing erosion of surrounding skin.  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  IR converted to larger G-J tube on 1/15, but it has continued to leak.  On 1/19 the [place] tube was permanently removed without complications.  He continues to have leakage through the non-healing tube site and is therefore unable to meet nutrition goals with oral diet. Pharmacy is consulted for trial of short-term TPN to provide nutrition and avoid oral intake that is exacerbating the healing process around his feeding tube site.  Significant events:  1/28 consumed water with leakage at previous G tube site - feeding tube site has not healed, but is improving.  To continue TPN. 2/4 feeding tube site still leaking 2/5 surgery recs to continue TPN for another 1-2 weeks to give Gtube site time to heal  Insulin requirements past 24 hours: 24 units from SSI in 24hrs - PTA metformin 500mg  BID, chronic prednisone 5mg .  Currently on Dexamethasone IV.  Current Nutrition: NPO   IVF: NS at 10 ml/hr  Central access: Implanted port (05/21/16) TPN start date: 01/22/17  ASSESSMENT                                                                                                          Today:   Glucose:  goal range < 150.  Range 122-224 with rSSI and 45 units of insulin in TPN.    PTA:  Metformin 500mg  BID, chronic prednisone 5mg   Started dexamethasone IV  while NPO.  Reduced from 2mg  (1/23-1/29) to 1mg  (1/29 -   )  Electrolytes:  Mag low, others not drawn today  Renal:  Stable, Scr wnl  LFTs:  Low/stable  TGs:  91 (1/23) 60 (1/28) 42 (2/4)  Prealbumin:  <5 (1/23), 14.5 (1/28)   NUTRITIONAL GOALS                                                                                             RD recs (2/4):  110-120 Protein, 2350-2550 Kcal  Clinimix 5/20 at a goal rate of 83 ml/hr + 20% fat emulsion  240 ml/day to provide: 100 g of protein and 2233 kCals per day meeting 91% of protein and 95% of kCal needs  - Glucose infusion rate will be 4.07 mg/kg/min (Maximum 5 mg/kg/min)   PLAN                                                                                                           Now:  - Magnesium sulfate 2 gm IV x1  At 1800 today:  Continue Clinimix E 5/20 at 83 ml/hr (goal rate)  TPN to contain standard multivitamins and trace element daily.  Continue regular insulin at 45 units  in TPN / 24 hrs.   20% fat emulsion at 20 ml/hr x12 hours/day  IVF at 10 ml/hr.  Bmet with mag in am  Continue CBGs and resistant SSI q4h.   TPN lab panels on Mondays & Thursdays.   Dolly Rias RPh 02/09/2017, 7:33 AM Pager [phone]

## 2017-02-09 NOTE — Progress Notes (Signed)
PHYSICAL THERAPY  Spoke to son, spouse and pt about D/C planning in some length.  Son stated the Oncologist suggested possibility of CIR.  Based on pt's performance/mobility  level , rec remains SNF.   If insurance declines, Rec HHPT  Rica Koyanagi  PTA [facility]  Acute  Rehab Pager      [phone]

## 2017-02-09 NOTE — Clinical Social Work Placement (Signed)
   CLINICAL SOCIAL WORK PLACEMENT  NOTE  Date:  02/09/2017  Patient Details  Name: Andres Smith MRN: [id number] Date of Birth: 02-14-1940  Clinical Social Work is seeking post-discharge placement for this patient at the [place] level of care (*CSW will initial, date and re-position this form in  chart as items are completed):  Yes   Patient/family provided with [place] Work Department's list of facilities offering this level of care within the geographic area requested by the patient (or if unable, by the patient's family).  Yes   Patient/family informed of their freedom to choose among providers that offer the needed level of care, that participate in Medicare, Medicaid or managed care program needed by the patient, have an available bed and are willing to accept the patient.  Yes   Patient/family informed of [place]'s ownership interest in [facility] and [facility], as well as of the fact that they are under no obligation to receive care at these facilities.  PASRR submitted to EDS on       PASRR number received on 02/08/17     Existing PASRR number confirmed on       FL2 transmitted to all facilities in geographic area requested by pt/family on 02/08/17     FL2 transmitted to all facilities within larger geographic area on       Patient informed that his/her managed care company has contracts with or will negotiate with certain facilities, including the following:        Yes   Patient/family informed of bed offers received.  Patient chooses bed at       Physician recommends and patient chooses bed at      Patient to be transferred to   on  .  Patient to be transferred to facility by       Patient family notified on   of transfer.  Name of family member notified:        PHYSICIAN       Additional Comment:    _______________________________________________ Burnis Medin, LCSW 02/09/2017, 1:00 PM

## 2017-02-09 NOTE — Progress Notes (Signed)
CSW contacted by patient's insurance ([place]) and informed that patient's insurance authorization was going to be submitted to the Market researcher for review. Staff requested patient's SNF selection, CSW informed staff that patient's SNF selection has not been made.  CSW provided bed offers to patient, patient's son and patient's wife. Patient's son requested PT to come evaluate patient for [facility] Inpatient rehab, CSW agreed to notify patient's RN. CSW explained that patient's insurance authorization is pending and will need a SNF selection to complete authorization. Patient's family reported that they were not ready to make a selection and requested CSW contact [facility] SNF to see if they are able to offer patient a bed.   CSW contacted [facility] SNF to see if they are able to offer patient a bed, staff agreed to review and contact CSW with an update.   CSW will continue to follow and assist with discharge planning.  Abundio Miu, [place] Social Worker [facility] Cell#: [phone]

## 2017-02-10 LAB — BASIC METABOLIC PANEL
ANION GAP: 4 — AB (ref 5–15)
BUN: 24 mg/dL — ABNORMAL HIGH (ref 6–20)
CO2: 27 mmol/L (ref 22–32)
Calcium: 8.5 mg/dL — ABNORMAL LOW (ref 8.9–10.3)
Chloride: 104 mmol/L (ref 101–111)
Creatinine, Ser: 0.63 mg/dL (ref 0.61–1.24)
Glucose, Bld: 175 mg/dL — ABNORMAL HIGH (ref 65–99)
POTASSIUM: 3.7 mmol/L (ref 3.5–5.1)
SODIUM: 135 mmol/L (ref 135–145)

## 2017-02-10 LAB — GLUCOSE, CAPILLARY
GLUCOSE-CAPILLARY: 134 mg/dL — AB (ref 65–99)
GLUCOSE-CAPILLARY: 136 mg/dL — AB (ref 65–99)
GLUCOSE-CAPILLARY: 137 mg/dL — AB (ref 65–99)
GLUCOSE-CAPILLARY: 182 mg/dL — AB (ref 65–99)
Glucose-Capillary: 169 mg/dL — ABNORMAL HIGH (ref 65–99)
Glucose-Capillary: 174 mg/dL — ABNORMAL HIGH (ref 65–99)

## 2017-02-10 LAB — MAGNESIUM: MAGNESIUM: 1.6 mg/dL — AB (ref 1.7–2.4)

## 2017-02-10 MED ORDER — MAGNESIUM SULFATE 2 GM/50ML IV SOLN
2.0000 g | Freq: Once | INTRAVENOUS | Status: AC
  Administered 2017-02-10: 2 g via INTRAVENOUS
  Filled 2017-02-10: qty 50

## 2017-02-10 MED ORDER — FAT EMULSION 20 % IV EMUL
240.0000 mL | INTRAVENOUS | Status: AC
  Administered 2017-02-10: 240 mL via INTRAVENOUS
  Filled 2017-02-10: qty 250

## 2017-02-10 MED ORDER — TRACE MINERALS CR-CU-MN-SE-ZN 10-1000-500-60 MCG/ML IV SOLN
INTRAVENOUS | Status: DC
  Administered 2017-02-10: 18:00:00 via INTRAVENOUS
  Filled 2017-02-10: qty 1992

## 2017-02-10 NOTE — Social Work (Addendum)
CSW met with spouse,son and patient at bedside to discuss SNF options. Family wanted an updated on [facility] and [facility].  Family concerned about patient discharge today as they are still not settled on a SNF. CSW reiterated that doctor is looking to discharge patient once they selected a SNF.   CSW contacted [facility] and was advised by admission that they will have a SNF bed on 2/11. CSW f/u with admission at [facility] and have not heard back.  CSW will continue to follow up.  3:10pm-CSW received call back from [facility] to discuss case.  CSW was advised SNF  patient needs TPN. SNF advised that they cannot offer SNF bed at this time.  Elissa Hefty, [place] Clinical Social Worker-Weekend [place] [phone]

## 2017-02-10 NOTE — Progress Notes (Signed)
Andres Smith NOTE   Pharmacy Consult for TPN Indication: unable to tolerate PO intake d/t PEG tube site leakage  Patient Measurements: Body mass index is 22.76 kg/m. Filed Weights   01/21/17 0610 01/28/17 0410 02/04/17 0359  Weight: 149 lb 11.2 oz (67.9 kg) 149 lb 7.6 oz (67.8 kg) 149 lb 11.1 oz (67.9 kg)  Ideal Body Weight:  70 kg  HPI:  49 yoM admitted on 1/17 with c/o G-tube leaking blood and gastric contents around exit site causing erosion of surrounding skin.  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  IR converted to larger G-J tube on 1/15, but it has continued to leak.  On 1/19 the [place] tube was permanently removed without complications.  He continues to have leakage through the non-healing tube site and is therefore unable to meet nutrition goals with oral diet. Pharmacy is consulted for trial of short-term TPN to provide nutrition and avoid oral intake that is exacerbating the healing process around his feeding tube site.  Significant events:  1/28 consumed water with leakage at previous G tube site - feeding tube site has not healed, but is improving.  To continue TPN. 2/4 feeding tube site still leaking 2/5 surgery recs to continue TPN for another 1-2 weeks to give Gtube site time to heal  Insulin requirements past 24 hours: 17 units from SSI in 24hrs - PTA metformin 500mg  BID, chronic prednisone 5mg .  Currently on Dexamethasone IV.  Current Nutrition: NPO   IVF: NS at 10 ml/hr  Central access: Implanted port (05/21/16) TPN start date: 01/22/17  ASSESSMENT                                                                                                          Today:   Glucose:  goal range < 150.  Range 132-195 with rSSI and 45 units of insulin in TPN.    PTA:  Metformin 500mg  BID, chronic prednisone 5mg   Started dexamethasone IV  while NPO.  Reduced from 2mg  (1/23-1/29) to 1mg  (1/29 -   )  Electrolytes:  Mag low, others WNL  Renal:  Stable, Scr wnl  LFTs:  Low/stable  TGs:  91 (1/23) 60 (1/28) 42 (2/4)  Prealbumin:  <5 (1/23), 14.5 (1/28)   NUTRITIONAL GOALS                                                                                             RD recs (2/4):  110-120 Protein, 2350-2550 Kcal  Clinimix 5/20 at a goal rate of 83 ml/hr + 20% fat emulsion 240 ml/day  to provide: 100 g of protein and 2233 kCals per day meeting 91% of protein and 95% of kCal needs  - Glucose infusion rate will be 4.07 mg/kg/min (Maximum 5 mg/kg/min)   PLAN                                                                                                           Now:  - Magnesium sulfate 2 gm IV x1  At 1800 today:  Continue Clinimix E 5/20 at 83 ml/hr (goal rate)  TPN to contain standard multivitamins and trace element daily.  Continue regular insulin at 45 units  in TPN / 24 hrs.   20% fat emulsion at 20 ml/hr x12 hours/day  IVF at 10 ml/hr.  Continue CBGs and resistant SSI q4h.   TPN lab panels on Mondays & Thursdays.   Andres Smith RPh 02/10/2017, 9:41 AM Pager [phone]

## 2017-02-10 NOTE — Progress Notes (Signed)
PROGRESS NOTE    Andres Smith  ZJI:[id number] DOB: [birth year] DOA: 01/17/2017 PCP: No primary care provider on file.   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 G-tube was removed.  Abdominal wall cellulitis around the previous G tube site  was being treated with antibiotics.  Currently he is on TPN as he is unable to tolerate by mouth because when he starts eating, there is leakage of gastric contents  from the previous G tube site. Hoping for closure of the G-tube site so that patient can be started on oral feeding. Currently the plan is to discharge him to SNF with TPN.   Assessment & Plan:   Principal Problem:   Malfunction of gastrostomy tube ([place]) Active Problems:   CROHN'S DISEASE-LARGE INTESTINE   Protein-calorie malnutrition, severe   Pressure injury of skin   Anemia associated with chemotherapy   Hypokalemia   Hypomagnesemia   Orthostatic hypotension  G-tube malfunction with abdominal cellulitis: S/P G-tube removal by IR. Cultures were negative.He finished the antibiotics course . Continue wound care around the area of the cellulitis Wound care following.  Continue PPI Hoping for the healing of the leak and starting on clear liquid diet soon On 01/28/17 ,we  checked the leakage from previous G tube site  at the bedside.  He was given a glass of water through mouth and immediately after 1-2 minutes the water started flowing from his previous G-tube site. Started clears on 2/4 but leakage from tube again noted On TPN General surgery was consulted for possible surgical closure of the previous G-tube site.  Recommended conservative management and hold for surgical intervention for now. Leak site healing very  well.  Orthostatic hypotension: Much improved today.Will taper IV fluids. Continue Midodrin  Moderate to severe protein calorie malnutrition/failure to thrive: Patient was afraid to eat because of pain/leakage of the gastric contents from the previous feeding tube site. Started on TPN on 01/22/17.  Pharmacy on board.  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 Alvy Bimler .  Not a candidate for chemotherapy now.Was on Gtube which has been removed. Currently he can swallow food but cannot eat due to gastric leakage. Oncology was following here.  History of recent CVA: On Plavix.   Coronary artery disease: Status post CABG.  Stable  Hypertension: Currently blood pressure stable.  We will continue to monitor.  Crohn's disease: On IV decadron.  Continue supportive care.  Hypokalemia/hypomagnesemia: Being Supplemented .  We will continue to monitor the levels.  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 We will continue to monitor the counts and transfuse as necessary. S/P 1 U PRBC transfusion 01/30/2016   Generalized weakness: Physical therapy following.    Patient has been planned to be discharged to skilled nursing facility.  Hyperglycemia: Continue sliding scale insulin.   Stage 1 pressure ulcer: Present on the coccygeal area.  No drainage, or breakage of skin.  Continue supportive care  Right Groin Pain: Patient complained of right groin pain on ambulation.  Ordered x-ray of the right hip.  Did not show any fractures or dislocation but just showed changes consistent with ankylosing spondylitis .Patient has history of ankylosing spondylitis . Pain has improved .     DVT prophylaxis: SCD Code Status: Partial code.  No intubation.  CPR okay  Family Communication: Updated the current situation and plan with the son Disposition Plan: SNF  tomorrow.   Consultants: Oncology  Procedures: TPN  Antimicrobials: Ceftriaxone   01/20/17-01/29/17 Vancomycin 01/17/17-01/20/17     Subjective: Patient seen and examined at the bed side this morning. No new issues/events. Orthostatic vitals taken today were negative.Patient said he cudnt sleep last night.  Objective: Vitals:   02/09/17 0710 02/09/17 1350 02/09/17 2043 02/10/17 0426  BP: 119/65 122/75 119/64 123/63  Pulse: 69 80 80 61  Resp: 16 12 14 14   Temp: 98.7 F (37.1 C) 98.2 F (36.8 C) 98 F (36.7 C) 98.1 F (36.7 C)  TempSrc: Oral Oral Oral Oral  SpO2: 99% 97% 100% 99%  Weight:      Height:        Intake/Output Summary (Last 24 hours) at 02/10/2017 1436 Last data filed at 02/10/2017 1216 Gross per 24 hour  Intake 5133.8 ml  Output 2150 ml  Net 2983.8 ml   Filed Weights   01/21/17 0610 01/28/17 0410 02/04/17 0359  Weight: 67.9 kg (149 lb 11.2 oz) 67.8 kg (149 lb 7.6 oz) 67.9 kg (149 lb 11.1 oz)    Examination:  General exam: Appears calm and comfortable ,Not in distress,thin/cachetic,elderly gentlemen HEENT:PERRL,Oral mucosa moist, Ear/Nose normal on gross exam Respiratory system: Bilateral equal air entry, normal vesicular breath sounds, no wheezes or crackles  Cardiovascular system: S1 & S2 heard, RRR. No JVD, murmurs, rubs, gallops or clicks. No pedal edema. Gastrointestinal system: Abdomen is nondistended, soft and nontender. No organomegaly or masses felt. Normal bowel sounds heard. Healing previous G tube site Central nervous system: Alert and oriented. No focal neurological deficits. Extremities: No edema, no clubbing ,no cyanosis, distal peripheral pulses palpable. Skin: No rashes, lesions or ulcers,no icterus ,no pallor MSK: Normal muscle bulk,tone ,power Psychiatry: Judgement and insight appear normal. Mood & affect appropriate.    Data Reviewed: I have personally reviewed following labs and imaging studies  CBC: Recent Labs  Lab 02/04/17 0652 02/05/17 0540 02/06/17 0524  WBC 7.7 7.6 7.8  NEUTROABS 5.9  --   --   HGB 8.7*  8.4* 8.4*  HCT 25.4* 25.3* 25.3*  MCV 92.7 94.1 92.3  PLT 139* 143* 497*   Basic Metabolic Panel: Recent Labs  Lab 02/04/17 0652 02/05/17 0540 02/06/17 0524 02/07/17 0500 02/08/17 0728 02/09/17 0500 02/10/17 0500  NA 135 136 134* 135 138  --  135  K 3.8 3.9 3.8 4.1 4.2  --  3.7  CL 100* 101 100* 101 101  --  104  CO2 29 31 29 29 31   --  27  GLUCOSE 122* 163* 177* 167* 166*  --  175*  BUN 28* 26* 27* 28* 29*  --  24*  CREATININE 0.68 0.65 0.63 0.65 0.79  --  0.63  CALCIUM 8.9 8.9 8.9 9.0 9.4  --  8.5*  MG 1.6* 1.8  --  1.6* 1.9 1.6* 1.6*  PHOS 4.0 3.5  --  3.9 3.7  --   --    GFR: Estimated Creatinine Clearance: 74.3 mL/min (by C-G formula based on SCr of 0.63 mg/dL). Liver Function Tests: Recent Labs  Lab 02/04/17 0652 02/07/17 0500  AST 41 28  ALT 46 34  ALKPHOS 140* 147*  BILITOT 0.4 0.3  PROT 5.5* 5.6*  ALBUMIN 2.4* 2.5*   No results for input(s): LIPASE, AMYLASE in the last 168 hours. No results for input(s): AMMONIA in the last 168 hours. Coagulation Profile: No results for input(s):  INR, PROTIME in the last 168 hours. Cardiac Enzymes: No results for input(s): CKTOTAL, CKMB, CKMBINDEX, TROPONINI in the last 168 hours. BNP (last 3 results) No results for input(s): PROBNP in the last 8760 hours. HbA1C: No results for input(s): HGBA1C in the last 72 hours. CBG: Recent Labs  Lab 02/09/17 2042 02/10/17 0011 02/10/17 0423 02/10/17 0758 02/10/17 1210  GLUCAP 151* 137* 136* 169* 174*   Lipid Profile: No results for input(s): CHOL, HDL, LDLCALC, TRIG, CHOLHDL, LDLDIRECT in the last 72 hours. Thyroid Function Tests: No results for input(s): TSH, T4TOTAL, FREET4, T3FREE, THYROIDAB in the last 72 hours. Anemia Panel: No results for input(s): VITAMINB12, FOLATE, FERRITIN, TIBC, IRON, RETICCTPCT in the last 72 hours. Sepsis Labs: No results for input(s): PROCALCITON, LATICACIDVEN in the last 168 hours.  No results found for this or any previous visit (from  the past 240 hour(s)).       Radiology Studies: No results found.      Scheduled Meds: . clopidogrel  75 mg Oral Daily  . dexamethasone  1 mg Intravenous Q24H  . guaiFENesin  600 mg Oral BID  . insulin aspart  0-20 Units Subcutaneous Q4H  . midodrine  5 mg Oral TID WC  . nystatin  5 mL Oral QID  . pantoprazole (PROTONIX) IV  40 mg Intravenous Q24H  . Zinc Oxide   Topical Q4H   Continuous Infusions: . sodium chloride 125 mL/hr at 02/10/17 0324  . famotidine (PEPCID) IV Stopped (02/09/17 2123)  . Marland KitchenTPN (CLINIMIX-E) Adult     And  . fat emulsion    . magnesium sulfate 1 - 4 g bolus IVPB    . Marland KitchenTPN (CLINIMIX-E) Adult 83 mL/hr at 02/09/17 1747     LOS: 24 days    Time spent: 25 mins    Ainhoa Rallo Jodie Echevaria, MD [facility] Pager [phone]  If 7PM-7AM, please contact night-coverage [email or web address] Password [facility] 02/10/2017, 2:36 PM

## 2017-02-11 DIAGNOSIS — D696 Thrombocytopenia, unspecified: Secondary | ICD-10-CM

## 2017-02-11 LAB — COMPREHENSIVE METABOLIC PANEL
ALBUMIN: 2.1 g/dL — AB (ref 3.5–5.0)
ALK PHOS: 129 U/L — AB (ref 38–126)
ALT: 23 U/L (ref 17–63)
ANION GAP: 8 (ref 5–15)
AST: 21 U/L (ref 15–41)
BUN: 23 mg/dL — ABNORMAL HIGH (ref 6–20)
CALCIUM: 8.6 mg/dL — AB (ref 8.9–10.3)
CO2: 26 mmol/L (ref 22–32)
CREATININE: 0.68 mg/dL (ref 0.61–1.24)
Chloride: 103 mmol/L (ref 101–111)
GFR calc Af Amer: >60 mL/min (ref >60)
GFR calc non Af Amer: >60 mL/min (ref >60)
GLUCOSE: 156 mg/dL — AB (ref 65–99)
Potassium: 3.6 mmol/L (ref 3.5–5.1)
SODIUM: 137 mmol/L (ref 135–145)
Total Bilirubin: 0.4 mg/dL (ref 0.3–1.2)
Total Protein: 4.9 g/dL — ABNORMAL LOW (ref 6.5–8.1)

## 2017-02-11 LAB — CBC
HCT: 23.9 % — ABNORMAL LOW (ref 39.0–52.0)
Hemoglobin: 7.7 g/dL — ABNORMAL LOW (ref 13.0–17.0)
MCH: 30.4 pg (ref 26.0–34.0)
MCHC: 32.2 g/dL (ref 30.0–36.0)
MCV: 94.5 fL (ref 78.0–100.0)
Platelets: 123 10*3/uL — ABNORMAL LOW (ref 150–400)
RBC: 2.53 MIL/uL — ABNORMAL LOW (ref 4.22–5.81)
RDW: 16 % — AB (ref 11.5–15.5)
WBC: 5.9 10*3/uL (ref 4.0–10.5)

## 2017-02-11 LAB — GLUCOSE, CAPILLARY
GLUCOSE-CAPILLARY: 119 mg/dL — AB (ref 65–99)
GLUCOSE-CAPILLARY: 188 mg/dL — AB (ref 65–99)
Glucose-Capillary: 109 mg/dL — ABNORMAL HIGH (ref 65–99)
Glucose-Capillary: 162 mg/dL — ABNORMAL HIGH (ref 65–99)
Glucose-Capillary: 140 mg/dL — ABNORMAL HIGH (ref 65–99)
Glucose-Capillary: 100 mg/dL — ABNORMAL HIGH (ref 65–99)

## 2017-02-11 LAB — DIFFERENTIAL
BASOS PCT: 0 %
Basophils Absolute: 0 10*3/uL (ref 0.0–0.1)
Eosinophils Absolute: 0.2 10*3/uL (ref 0.0–0.7)
Eosinophils Relative: 3 %
Lymphocytes Relative: 9 %
Lymphs Abs: 0.5 10*3/uL — ABNORMAL LOW (ref 0.7–4.0)
Monocytes Absolute: 0.3 10*3/uL (ref 0.1–1.0)
Monocytes Relative: 6 %
NEUTROS ABS: 4.8 10*3/uL (ref 1.7–7.7)
Neutrophils Relative %: 82 %

## 2017-02-11 LAB — PREPARE RBC (CROSSMATCH)

## 2017-02-11 LAB — TRIGLYCERIDES: Triglycerides: 29 mg/dL (ref <150)

## 2017-02-11 LAB — MAGNESIUM: Magnesium: 1.6 mg/dL — ABNORMAL LOW (ref 1.7–2.4)

## 2017-02-11 LAB — PHOSPHORUS: Phosphorus: 3.3 mg/dL (ref 2.5–4.6)

## 2017-02-11 MED ORDER — FAMOTIDINE 20 MG PO TABS
20.0000 mg | ORAL_TABLET | Freq: Every day | ORAL | Status: DC
  Administered 2017-02-11: 20 mg via ORAL
  Filled 2017-02-11: qty 1

## 2017-02-11 MED ORDER — SODIUM CHLORIDE 0.9 % IV SOLN
Freq: Once | INTRAVENOUS | Status: AC
  Administered 2017-02-11: 10:00:00 via INTRAVENOUS

## 2017-02-11 MED ORDER — ADULT MULTIVITAMIN W/MINERALS CH
1.0000 | ORAL_TABLET | Freq: Every day | ORAL | Status: DC
  Administered 2017-02-12: 1 via ORAL
  Filled 2017-02-11 (×2): qty 1

## 2017-02-11 MED ORDER — ACETAMINOPHEN 325 MG PO TABS
650.0000 mg | ORAL_TABLET | Freq: Once | ORAL | Status: AC
  Administered 2017-02-11: 650 mg via ORAL
  Filled 2017-02-11: qty 2

## 2017-02-11 MED ORDER — MAGNESIUM SULFATE 4 GM/100ML IV SOLN
4.0000 g | Freq: Once | INTRAVENOUS | Status: AC
  Administered 2017-02-11: 4 g via INTRAVENOUS
  Filled 2017-02-11: qty 100

## 2017-02-11 MED ORDER — MORPHINE SULFATE (PF) 4 MG/ML IV SOLN
4.0000 mg | Freq: Once | INTRAVENOUS | Status: AC
  Administered 2017-02-11: 4 mg via INTRAVENOUS
  Filled 2017-02-11: qty 1

## 2017-02-11 MED ORDER — PANTOPRAZOLE SODIUM 40 MG PO TBEC
40.0000 mg | DELAYED_RELEASE_TABLET | Freq: Every day | ORAL | Status: DC
  Administered 2017-02-12: 40 mg via ORAL
  Filled 2017-02-11: qty 1

## 2017-02-11 MED ORDER — MORPHINE SULFATE (PF) 2 MG/ML IV SOLN
2.0000 mg | INTRAVENOUS | Status: DC | PRN
  Administered 2017-02-12 – 2017-02-14 (×11): 2 mg via INTRAVENOUS
  Filled 2017-02-11 (×11): qty 1

## 2017-02-11 MED ORDER — ENSURE ENLIVE PO LIQD
237.0000 mL | Freq: Two times a day (BID) | ORAL | Status: DC
  Administered 2017-02-11 – 2017-02-12 (×2): 237 mL via ORAL

## 2017-02-11 NOTE — Progress Notes (Addendum)
[place] NOTE   Pharmacy Consult for TPN Indication: unable to tolerate PO intake d/t PEG tube site leakage  Patient Measurements: Body mass index is 22.76 kg/m. Filed Weights   01/21/17 0610 01/28/17 0410 02/04/17 0359  Weight: 149 lb 11.2 oz (67.9 kg) 149 lb 7.6 oz (67.8 kg) 149 lb 11.1 oz (67.9 kg)  Ideal Body Weight:  70 kg  HPI:  34 yoM admitted on 1/17 with c/o G-tube leaking blood and gastric contents around exit site causing erosion of surrounding skin.  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  IR converted to larger G-J tube on 1/15, but it has continued to leak.  On 1/19 the [place] tube was permanently removed without complications.  He continues to have leakage through the non-healing tube site and is therefore unable to meet nutrition goals with oral diet. Pharmacy is consulted for trial of short-term TPN to provide nutrition and avoid oral intake that is exacerbating the healing process around his feeding tube site.  Significant events:  1/28 consumed water with leakage at previous G tube site - feeding tube site has not healed, but is improving.  To continue TPN. 2/4 feeding tube site still leaking 2/5 surgery recs to continue TPN for another 1-2 weeks to give Gtube site time to heal  Insulin requirements past 24 hours: 19 units from SSI in 24hrs - PTA metformin 500mg  BID, chronic prednisone 5mg .  Currently on Dexamethasone 1 mg IV q24  Current Nutrition: NPO   IVF: NS at 10 ml/hr  Central access: Implanted port (05/21/16) TPN start date: 01/22/17  ASSESSMENT                                                                                                          Today:   Glucose:  goal range < 150.  Range 109-182 with rSSI and 45 units of insulin in TPN.    PTA:  Metformin 500mg  BID, chronic prednisone 5mg   Started  dexamethasone IV while NPO.  Reduced from 2mg  (1/23-1/29) to 1mg  (1/29 -   )  Electrolytes:  Mag low, others WNL  Renal:  Stable, Scr wnl  LFTs:  OK  TGs:  91 (1/23) 60 (1/28) 42 (2/4)  Prealbumin:  <5 (1/23), 14.5 (1/28)   NUTRITIONAL GOALS                                                                                             RD recs (2/4):  110-120 Protein, 2350-2550 Kcal  Clinimix 5/20 at a goal rate of 83 ml/hr + 20% fat  emulsion 240 ml/day to provide: 100 g of protein and 2233 kCals per day meeting 91% of protein and 95% of kCal needs  - Glucose infusion rate will be 4.07 mg/kg/min (Maximum 5 mg/kg/min)   PLAN                                                                                                           Now:  - Magnesium sulfate 4 gm IV x1  TPN DC'd by MD, full liquid diet and calorie count ordered  Eudelia Bunch, Pharm.D. [phone] 02/11/2017 7:57 AM

## 2017-02-11 NOTE — Progress Notes (Signed)
The patient is receiving Protonix and Pepcid by the intravenous route.  Based on criteria approved by the Pharmacy and [place], the medication is being converted to the equivalent oral dose form.  These criteria include: -No active GI bleeding -Able to tolerate diet of full liquids (or better) or tube feeding -Able to tolerate other medications by the oral or enteral route  If you have any questions about this conversion, please contact the Pharmacy Department (phone [phone]  Thank you. Eudelia Bunch, Pharm.D. [phone] 02/11/2017 1:13 PM

## 2017-02-11 NOTE — Progress Notes (Signed)
Calorie Count Note  72 hour calorie count ordered. Day 1 results will follow on 2/12.  RD hung calorie count envelope on the patient's door. Staff to document percent consumed for each item on the patient's meal tray ticket and keep in envelope. Also document percent of any supplement or snack pt consumes and keep documentation in envelope for RD to review.   Clayton Bibles, MS, RD, [place] Dietitian Pager: [phone] After Hours Pager: [phone]

## 2017-02-11 NOTE — Progress Notes (Signed)
Nutrition Follow-up  DOCUMENTATION CODES:   Severe malnutrition in context of chronic illness  INTERVENTION:   -72 hour Calorie Count (2/11-2/13) -see separate note  -TPN per Pharmacy -weaning off today -Provide Ensure Enlive po BID, each supplement provides 350 kcal and 20 grams of protein -Provide Multivitamin with minerals daily  NUTRITION DIAGNOSIS:   Severe Malnutrition related to chronic illness, cancer and cancer related treatments as evidenced by percent weight loss, moderate fat depletion, severe fat depletion, severe muscle depletion.  Ongoing.  GOAL:   Patient will meet greater than or equal to 90% of their needs  Meeting with TPN.  MONITOR:   Weight trends, Labs, Skin(TPN)  REASON FOR ASSESSMENT:   Consult Calorie Count  ASSESSMENT:   Pt with PMH significant for buccal mucosa squamous cell carcinoma (chemo/radiation), dysphagia, s/p PEG placement May 2018, CAD, DM, HLD, and HTN. Presents this admission with PEG tube malfunction. Surgery notes pt had PEG tube upsize from 36F to 85F 1/15, but it continues to leak around the site.   MD ordered Calorie Count and advanced pt's diet to full liquid. Pt's wife in room with patient, reviewed liquid options for patient to try now that he is on a full liquid diet. Encouraged sipping throughout the day. Pt interested in drinking Ensure supplements as well, RD to order.  Weight continues to be stable.   Per Pharmacy note, TPN will be discontinued today after bag runs out.   Medications: IV Decadron daily, IV Mg sulfate once,  Labs reviewed: CBGs: 119-162 Low Mg Phos WNL TG: 29 mg/dL  Diet Order:  Diet full liquid Room service appropriate? Yes; Fluid consistency: Thin  EDUCATION NEEDS:   Education needs have been addressed  Skin:  Skin Assessment: Skin Integrity Issues: Skin Integrity Issues:: Other (Comment), Stage II Stage II: sacrum Other: non-pressure abdominal wound  Last BM:  2/2  Height:   Ht  Readings from Last 1 Encounters:  01/17/17 5\' 8"  (1.727 m)    Weight:   Wt Readings from Last 1 Encounters:  02/04/17 149 lb 11.1 oz (67.9 kg)    Ideal Body Weight:  70 kg  BMI:  Body mass index is 22.76 kg/m.  Estimated Nutritional Needs:   Kcal:  4967-5916 kcal/day  Protein:  110-120 g/day  Fluid:  >2.3 L/day  Clayton Bibles, MS, RD, LDN [place] [facility] Pager: [phone] After Hours Pager: [phone]

## 2017-02-11 NOTE — Progress Notes (Signed)
PROGRESS NOTE    Andres Smith  GGE:[id number] DOB: January 30, 1940 DOA: 01/17/2017 PCP: No primary care provider on file.   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 G-tube was removed.  Abdominal wall cellulitis around the previous G tube site  was being treated with antibiotics.  He was on TPN as he is unable to tolerate by mouth because when he starts eating, there is leakage of gastric contents  from the previous G tube site.  G tube leak site has healed well and he has been started on oral diet today.  Assessment & Plan:   Principal Problem:   Malfunction of gastrostomy tube ([place]) Active Problems:   CROHN'S DISEASE-LARGE INTESTINE   Protein-calorie malnutrition, severe   Pressure injury of skin   Anemia associated with chemotherapy   Hypokalemia   Hypomagnesemia   Orthostatic hypotension  G-tube malfunction with abdominal cellulitis:Almost healed.No leakage seen today after he was given food.Started on full liquid diet. S/P G-tube removal by IR. Presented with cellulitis around the previous G-tube site .Cultures were negative.He finished the antibiotics course . Continue wound care around the area of the cellulitis Wound care was following.  Continue PPI General surgery was consulted for possible surgical closure of the previous G-tube site.  Recommended conservative management and hold for surgical intervention for now. Leak site healing very well.  Orthostatic hypotension: Much improved.Will taper IV fluids. Continue Midodrine  Moderate to severe protein calorie malnutrition/failure to thrive: Patient was afraid to eat because of pain/leakage of the gastric contents from the previous feeding tube site. Oral diet has been  started which will be bridged with TPN that was started on 01/22/17. Pharmacy on board.  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 Alvy Bimler .  Not a candidate for chemotherapy now.Was on Gtube which has been removed. Currently he can swallow food but cannot eat due to gastric leakage. Oncology was following here.  History of recent CVA: On Plavix.   Coronary artery disease: Status post CABG.  Stable  Hypertension: Currently blood pressure stable.  We will continue to monitor.  Crohn's disease: On IV decadron.  Continue supportive care.  Hypokalemia/hypomagnesemia: Being Supplemented .  We will continue to monitor the levels.  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 We will continue to monitor the counts and transfuse as necessary. S/P 1 U PRBC transfusion 01/30/2016  He is being transfused again today with 1 unit of PRBC.  Generalized weakness: Physical therapy was  following.    Patient had been planned to be discharged to skilled nursing facility bu this plan might be changed now as he has tolerated the diet.  Hyperglycemia: Continue sliding scale insulin.   Stage 1 pressure ulcer: Present on the coccygeal area.  No drainage, or breakage of skin.  Continue supportive care  Right Groin Pain: Patient complained of right groin pain on ambulation.  Ordered x-ray of the right hip.  Did not show any fractures or dislocation but just showed changes consistent with ankylosing spondylitis .Patient has history of ankylosing spondylitis . Pain has improved .     DVT prophylaxis: SCD Code Status: Partial code.  No intubation.  CPR okay Family Communication: Wife on the bed side Disposition Plan: Home with home health  Consultants: Oncology  Procedures: TPN  Antimicrobials:  Ceftriaxone  01/20/17-01/29/17 Vancomycin 01/17/17-01/20/17     Subjective: Patient seen and examined the bedside this morning.  Was sleeping.  Recently has  been weak. Previous G-tube site has healed well and he was started on diet today.  Objective: Vitals:   02/10/17 1447 02/10/17 2300 02/11/17 0409 02/11/17 1454  BP: (!) 120/54 (!) 112/58 (!) 99/43 114/61  Pulse: 64 63 90 71  Resp: 14 14 14 16   Temp: 98 F (36.7 C) 97.9 F (36.6 C) 98.6 F (37 C) 98.4 F (36.9 C)  TempSrc: Oral Oral Oral Oral  SpO2: 99% 100% 100% 98%  Weight:      Height:        Intake/Output Summary (Last 24 hours) at 02/11/2017 1524 Last data filed at 02/11/2017 0410 Gross per 24 hour  Intake -  Output 1245 ml  Net -1245 ml   Filed Weights   01/21/17 0610 01/28/17 0410 02/04/17 0359  Weight: 67.9 kg (149 lb 11.2 oz) 67.8 kg (149 lb 7.6 oz) 67.9 kg (149 lb 11.1 oz)    Examination:  General exam: Appears calm and comfortable ,Not in distress, thin/cachectic  HEENT:PERRL,Oral mucosa moist, Ear/Nose normal on gross exam Respiratory system: Bilateral equal air entry, normal vesicular breath sounds, no wheezes or crackles  Cardiovascular system: S1 & S2 heard, RRR. No JVD, murmurs, rubs, gallops or clicks. No pedal edema. Gastrointestinal system: Abdomen is nondistended, soft and nontender. No organomegaly or masses felt. Normal bowel sounds heard. Almost healed G-tube site Central nervous system: Alert and oriented. No focal neurological deficits. Extremities: No edema, no clubbing ,no cyanosis, distal peripheral pulses palpable. Skin: No rashes, lesions or ulcers,no icterus ,no pallor Psychiatry: Judgement and insight appear normal. Mood & affect appropriate.      Data Reviewed: I have personally reviewed following labs and imaging studies  CBC: Recent Labs  Lab 02/05/17 0540 02/06/17 0524 02/11/17 0640  WBC 7.6 7.8 5.9  NEUTROABS  --   --  4.8  HGB 8.4* 8.4* 7.7*  HCT 25.3* 25.3* 23.9*  MCV 94.1 92.3 94.5  PLT 143* 136* 174*   Basic Metabolic Panel: Recent Labs  Lab 02/05/17 0540 02/06/17 0524 02/07/17 0500 02/08/17 0728 02/09/17 0500  02/10/17 0500 02/11/17 0500  NA 136 134* 135 138  --  135 137  K 3.9 3.8 4.1 4.2  --  3.7 3.6  CL 101 100* 101 101  --  104 103  CO2 31 29 29 31   --  27 26  GLUCOSE 163* 177* 167* 166*  --  175* 156*  BUN 26* 27* 28* 29*  --  24* 23*  CREATININE 0.65 0.63 0.65 0.79  --  0.63 0.68  CALCIUM 8.9 8.9 9.0 9.4  --  8.5* 8.6*  MG 1.8  --  1.6* 1.9 1.6* 1.6* 1.6*  PHOS 3.5  --  3.9 3.7  --   --  3.3   GFR: Estimated Creatinine Clearance: 74.3 mL/min (by C-G formula based on SCr of 0.68 mg/dL). Liver Function Tests: Recent Labs  Lab 02/07/17 0500 02/11/17 0500  AST 28 21  ALT 34 23  ALKPHOS 147* 129*  BILITOT 0.3 0.4  PROT 5.6* 4.9*  ALBUMIN 2.5* 2.1*   No results for input(s): LIPASE, AMYLASE in the last 168 hours. No results for input(s): AMMONIA in the last 168 hours. Coagulation Profile: No results for input(s): INR, PROTIME in the last 168 hours. Cardiac Enzymes: No results for input(s): CKTOTAL, CKMB, CKMBINDEX, TROPONINI in the last 168 hours.  BNP (last 3 results) No results for input(s): PROBNP in the last 8760 hours. HbA1C: No results for input(s): HGBA1C in the last 72 hours. CBG: Recent Labs  Lab 02/10/17 2019 02/11/17 0006 02/11/17 0402 02/11/17 0803 02/11/17 1215  GLUCAP 134* 109* 162* 119* 188*   Lipid Profile: Recent Labs    02/11/17 0640  TRIG 29   Thyroid Function Tests: No results for input(s): TSH, T4TOTAL, FREET4, T3FREE, THYROIDAB in the last 72 hours. Anemia Panel: No results for input(s): VITAMINB12, FOLATE, FERRITIN, TIBC, IRON, RETICCTPCT in the last 72 hours. Sepsis Labs: No results for input(s): PROCALCITON, LATICACIDVEN in the last 168 hours.  No results found for this or any previous visit (from the past 240 hour(s)).       Radiology Studies: No results found.      Scheduled Meds: . clopidogrel  75 mg Oral Daily  . dexamethasone  1 mg Intravenous Q24H  . famotidine  20 mg Oral QHS  . feeding supplement (ENSURE ENLIVE)   237 mL Oral BID BM  . insulin aspart  0-20 Units Subcutaneous Q4H  . midodrine  5 mg Oral TID WC  . multivitamin with minerals  1 tablet Oral Daily  . nystatin  5 mL Oral QID  . [START ON 02/12/2017] pantoprazole  40 mg Oral Daily  . Zinc Oxide   Topical Q4H   Continuous Infusions: . sodium chloride 125 mL/hr at 02/11/17 1407     LOS: 25 days    Time spent: 25 mins    Andres Smith Jodie Echevaria, MD [facility] Pager [phone]  If 7PM-7AM, please contact night-coverage [email or web address] Password [facility] 02/11/2017, 3:24 PM

## 2017-02-11 NOTE — Progress Notes (Addendum)
Physical Therapy Treatment Patient Details Name: Andres Smith MRN: [id number] DOB: [birth year] Today's Date: 02/11/2017    History of Present Illness 77 year old male with past medical history of buccal mucosa squamous cell cancer, dysphagia with G-tube in place, atrial fibrillation, coronary artery disease status post CABG, recent CVA was admitted for the evaluation of malfunction of his G-tube and possible abdominal cellulitis.    PT Comments    Significant progress with mobility today, pt ambulated 200' with RW with. Pt reports 8/10 R hip pain with weight bearing, noted imaging showed ankylosing spondylitis B sacroiliac joints. Pt declined pain medication. He was not orthostatic today. Pt has mild R foot drop 2* recent CVA, he reports he was going to outpt PT prior to admission and they were ordering an AFO for him, he will call them to check on status of that. Will plan to do stair training tomorrow. PT now recommending HHPT.    Follow Up Recommendations  Home health PT   Equipment Recommendations  Rolling walker with 5" wheels; hospital bed   Recommendations for Other Services       Precautions / Restrictions Precautions Precautions: Fall Precaution Comments: R foot drop 2* recent CVA Restrictions Weight Bearing Restrictions: No    Mobility  Bed Mobility Overal bed mobility: Modified Independent                Transfers Overall transfer level: Needs assistance Equipment used: Rolling walker (2 wheeled) Transfers: Sit to/from Stand Sit to Stand: Min guard         General transfer comment: min/guard for safety, no LOB, pt denied dizziness  Ambulation/Gait Ambulation/Gait assistance: Min guard Ambulation Distance (Feet): 200 Feet Assistive device: Rolling walker (2 wheeled) Gait Pattern/deviations: Step-to pattern;Decreased step length - left;Decreased stance time - right;Decreased dorsiflexion - right Gait velocity: decreased Gait velocity interpretation: at  or above normal speed for age/gender General Gait Details: steady with RW, no loss of balance, R hip pain with weightbearing RLE (imaging showed ankylosing spondylitis B SIJs), minimal R foot drop   Stairs            Wheelchair Mobility    Modified Rankin (Stroke Patients Only)       Balance Overall balance assessment: Needs assistance   Sitting balance-Leahy Scale: Good       Standing balance-Leahy Scale: Fair                              Cognition Arousal/Alertness: Awake/alert Behavior During Therapy: WFL for tasks assessed/performed Overall Cognitive Status: Within Functional Limits for tasks assessed                                        Exercises General Exercises - Lower Extremity Ankle Circles/Pumps: AROM;Right;15 reps;Supine(also instructed pt in ankle alphabet for individual exercise. R ankle DF 3/5, R ankle eversion +3/5)    General Comments        Pertinent Vitals/Pain Pain Score: 8  Pain Location: R hip with walking Pain Descriptors / Indicators: Aching Pain Intervention(s): Limited activity within patient's tolerance;Monitored during session(pt declined pain meds)    Home Living                      Prior Function            PT Goals (current goals can  now be found in the care plan section) Acute Rehab PT Goals Patient Stated Goal: To go home when able to eat PT Goal Formulation: With patient Time For Goal Achievement: 02/25/17 Potential to Achieve Goals: Good Progress towards PT goals: Progressing toward goals    Frequency    Min 3X/week      PT Plan Current plan remains appropriate    Co-evaluation              AM-PAC PT "6 Clicks" Daily Activity  Outcome Measure  Difficulty turning over in bed (including adjusting bedclothes, sheets and blankets)?: A Little Difficulty moving from lying on back to sitting on the side of the bed? : A Little Difficulty sitting down on and standing  up from a chair with arms (e.g., wheelchair, bedside commode, etc,.)?: A Little Help needed moving to and from a bed to chair (including a wheelchair)?: A Little Help needed walking in hospital room?: A Little Help needed climbing 3-5 steps with a railing? : A Lot 6 Click Score: 17    End of Session Equipment Utilized During Treatment: Gait belt Activity Tolerance: Patient tolerated treatment well Patient left: with call bell/phone within reach;in bed Nurse Communication: Mobility status(drop in BP) PT Visit Diagnosis: Other abnormalities of gait and mobility (R26.89);History of falling (Z91.81);Difficulty in walking, not elsewhere classified (R26.2);Pain Pain - Right/Left: Right Pain - part of body: Hip     Time: 8811-0315 PT Time Calculation (min) (ACUTE ONLY): 21 min  Charges:  $Gait Training: 8-22 mins                    G Codes:          [name] 02/11/2017, 2:36 PM [phone]

## 2017-02-11 NOTE — Progress Notes (Signed)
PT Cancellation Note  Patient Details Name: Andres Smith MRN: [id number] DOB: December 26, 1940   Cancelled Treatment:    Reason Eval/Treat Not Completed: Fatigue/lethargy limiting ability to participate(pt sleeping soundly, wife stated pt had a busy morning and requested PT check back later. Will follow. )   [name] 02/11/2017, 11:16 AM [phone]

## 2017-02-11 NOTE — Progress Notes (Signed)
Andres Smith   DOB:1940-10-06   EE#:[id number]    Subjective: Events last week were noted.  According to the patient, he denies any further leakage from the feeding tube site.  He was started on midodrine due to hypotension tension.  He complained of fatigue and generalized weakness.  According to his wife, the decubitus ulcer in the sacrum area is improving.  Overall, she is frail and has deteriorated since the last time I saw him.  Objective:  Vitals:   02/10/17 2300 02/11/17 0409  BP: (!) 112/58 (!) 99/43  Pulse: 63 90  Resp: 14 14  Temp: 97.9 F (36.6 C) 98.6 F (37 C)  SpO2: 100% 100%     Intake/Output Summary (Last 24 hours) at 02/11/2017 0848 Last data filed at 02/11/2017 0410 Gross per 24 hour  Intake -  Output 1595 ml  Net -1595 ml    GENERAL:alert, no distress and comfortable.  He looks thin and cachectic with signs of muscle wasting SKIN: The skin near the feeding tube site is healing well.  I do not observe any further leakage after the patient drank an entire cup of water in front of me. EYES: normal, Conjunctiva are pink and non-injected, sclera clear OROPHARYNX: Noted well-healed surgical scar.  Oral thrush is noted NECK: supple, thyroid normal size, non-tender, without nodularity LYMPH:  no palpable lymphadenopathy in the cervical, axillary or inguinal LUNGS: clear to auscultation and percussion with normal breathing effort HEART: regular rate & rhythm and no murmurs and no lower extremity edema ABDOMEN:abdomen soft, non-tender and normal bowel sounds.  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 We will continue supportive care  Acquired pancytopenia [facility]) He had received 1 unit of blood transfusion on January 29, 2017.Repeat CBC shows decline in hemoglobin.  The patient is hypotensive. We discussed some of the risks, benefits, and alternatives of blood transfusions. The patient is symptomatic from anemia and the hemoglobin level is critically low.  Some of the side-effects to be expected including risks of transfusion reactions, chills, infection, syndrome of volume overload and risk of hospitalization from various reasons and the patient is willing to proceed and went ahead to sign consent today. We will proceed with 1 unit of blood transfusion today  Thrombocytopenia Could be due to mild nutrition Observe. There is no contraindication to remain on antiplatelet agents or anticoagulants as long as the platelet is greater than 50,000.  Oral thrush Could be due to malnutrition and chronic steroid therapy. He is on nystatin suspension and lidocaine as needed for mucositis pain  Cancer associated pain The patient has intermittent rib pain We will give him intermittent IV morphine as needed  Dysphagia, leaking around feeding tube,  resolved Dysphagia is stable He was started on TPN on January 22, 2017 With resolution of leak, we will attempt oral intake again.  Slow wound healing around feeding tube site& GERD Appreciate care from wound care nurse I think that is a component of reflux that exacerbate leakage of gastric content He was startedon proton pump inhibitor in the morning and Pepcid at night since 1/23 with symptomatic  improvement Appreciate general surgery consultation last week.  Continue local wound care dressing  Severe protein calorie malnutrition He is on TPN, will attempt oral feeding.  We will discontinue TPN after the current bag is finished We will start him on 72 hours calorie count Will consult dietitian for oral nutritional supplement We will watch out for refeeding syndrome He will continue electrolyte replacement therapy as needed I will start him on full liquid diet to be advanced to soft diet as tolerated  Sacral decubitus ulcer I encouraged the patient to walk around as much as possible  Chronic history of Inflammatory bowel disease He is on low-dose prednisone in the outpatient clinic Continue low-dose steroid therapy  Profound weakness His wife felt that the patient is too weak to go home.   The patient has progressive decline in performance status Anemia could be a contributing factor and we will proceed with blood transfusion He will continue physical therapy for now  Goals of care Overall, he is improving  Discharge planning He is not ready to be discharged, with high risk of refeeding syndrome with reintroduction of oral intake and discontinuation of TPN He may need to go back on TPN if you he is unable to meet intake orally He needed blood transfusion for support Frankly, in my opinion, he is not ready for discharge due to ongoing multiple active issues that in my opinion, cannot be resolved by going to a skilled nursing facility until he is medical issues have stabilized  [name], MD 02/11/2017  8:48 AM

## 2017-02-12 ENCOUNTER — Inpatient Hospital Stay (HOSPITAL_COMMUNITY): Payer: PPO

## 2017-02-12 LAB — CBC WITH DIFFERENTIAL/PLATELET
BASOS ABS: 0 10*3/uL (ref 0.0–0.1)
Basophils Relative: 0 %
EOS PCT: 1 %
Eosinophils Absolute: 0.1 10*3/uL (ref 0.0–0.7)
HCT: 26.4 % — ABNORMAL LOW (ref 39.0–52.0)
HEMOGLOBIN: 9 g/dL — AB (ref 13.0–17.0)
LYMPHS ABS: 0.9 10*3/uL (ref 0.7–4.0)
Lymphocytes Relative: 9 %
MCH: 31.7 pg (ref 26.0–34.0)
MCHC: 34.1 g/dL (ref 30.0–36.0)
MCV: 93 fL (ref 78.0–100.0)
Monocytes Absolute: 0.3 10*3/uL (ref 0.1–1.0)
Monocytes Relative: 4 %
NEUTROS PCT: 86 %
Neutro Abs: 7.9 10*3/uL — ABNORMAL HIGH (ref 1.7–7.7)
PLATELETS: 123 10*3/uL — AB (ref 150–400)
RBC: 2.84 MIL/uL — AB (ref 4.22–5.81)
RDW: 16.5 % — ABNORMAL HIGH (ref 11.5–15.5)
WBC: 9.3 10*3/uL (ref 4.0–10.5)

## 2017-02-12 LAB — BASIC METABOLIC PANEL
ANION GAP: 9 (ref 5–15)
BUN: 23 mg/dL — ABNORMAL HIGH (ref 6–20)
CALCIUM: 8.5 mg/dL — AB (ref 8.9–10.3)
CO2: 25 mmol/L (ref 22–32)
Chloride: 103 mmol/L (ref 101–111)
Creatinine, Ser: 0.6 mg/dL — ABNORMAL LOW (ref 0.61–1.24)
Glucose, Bld: 185 mg/dL — ABNORMAL HIGH (ref 65–99)
POTASSIUM: 4.2 mmol/L (ref 3.5–5.1)
Sodium: 137 mmol/L (ref 135–145)

## 2017-02-12 LAB — TYPE AND SCREEN
ABO/RH(D): A POS
Antibody Screen: NEGATIVE
Unit division: 0

## 2017-02-12 LAB — BPAM RBC
BLOOD PRODUCT EXPIRATION DATE: 201902252359
ISSUE DATE / TIME: 201902111448
Unit Type and Rh: 6200

## 2017-02-12 LAB — GLUCOSE, CAPILLARY: GLUCOSE-CAPILLARY: 125 mg/dL — AB (ref 65–99)

## 2017-02-12 LAB — PHOSPHORUS: PHOSPHORUS: 3.1 mg/dL (ref 2.5–4.6)

## 2017-02-12 LAB — MAGNESIUM: Magnesium: 1.7 mg/dL (ref 1.7–2.4)

## 2017-02-12 MED ORDER — IOPAMIDOL (ISOVUE-300) INJECTION 61%: 15.0000 mL | Freq: Two times a day (BID) | INTRAVENOUS | Status: DC | PRN

## 2017-02-12 MED ORDER — PANTOPRAZOLE SODIUM 40 MG IV SOLR
40.0000 mg | INTRAVENOUS | Status: DC
  Administered 2017-02-12: 40 mg via INTRAVENOUS
  Filled 2017-02-12: qty 40

## 2017-02-12 MED ORDER — DEXTROSE-NACL 5-0.9 % IV SOLN
INTRAVENOUS | Status: DC
  Administered 2017-02-12 – 2017-02-13 (×3): via INTRAVENOUS

## 2017-02-12 MED ORDER — IOPAMIDOL (ISOVUE-300) INJECTION 61%
INTRAVENOUS | Status: AC
  Administered 2017-02-12: 15 mL
  Filled 2017-02-12: qty 30

## 2017-02-12 MED ORDER — MAGNESIUM SULFATE 2 GM/50ML IV SOLN
2.0000 g | Freq: Once | INTRAVENOUS | Status: AC
  Administered 2017-02-12: 2 g via INTRAVENOUS
  Filled 2017-02-12: qty 50

## 2017-02-12 MED ORDER — MAGNESIUM SULFATE 4 GM/100ML IV SOLN
4.0000 g | Freq: Once | INTRAVENOUS | Status: AC
  Administered 2017-02-12: 4 g via INTRAVENOUS
  Filled 2017-02-12: qty 100

## 2017-02-12 MED ORDER — DEXAMETHASONE 0.5 MG PO TABS
1.0000 mg | ORAL_TABLET | Freq: Every day | ORAL | Status: DC
Start: 2017-02-13 — End: 2017-02-13
  Filled 2017-02-12: qty 2

## 2017-02-12 NOTE — Progress Notes (Signed)
Andres Smith   DOB:11-01-1940   SA#:[id number]    Subjective: He is complaining of severe epigastric pain after he eats last night.  Oral intake is minimum based on documentation by his wife.  His pain is relieved by morphine sulfate.  He denies burning sensation, just epigastric pain.  He denies nausea.  Objective:  Vitals:   02/11/17 2215 02/12/17 0616  BP: 109/67 (!) 116/59  Pulse: (!) 52 79  Resp: 20 16  Temp: 97.9 F (36.6 C) 98.8 F (37.1 C)  SpO2: 99% 92%     Intake/Output Summary (Last 24 hours) at 02/12/2017 3235 Last data filed at 02/12/2017 5732 Gross per 24 hour  Intake 302 ml  Output 1200 ml  Net -898 ml    GENERAL:alert, no distress and comfortable Musculoskeletal:no cyanosis of digits and no clubbing  NEURO: alert & oriented x 3 with fluent speech, no focal motor/sensory deficits   Labs:  Lab Results  Component Value Date   WBC 9.3 02/12/2017   HGB 9.0 (L) 02/12/2017   HCT 26.4 (L) 02/12/2017   MCV 93.0 02/12/2017   PLT 123 (L) 02/12/2017   NEUTROABS 7.9 (H) 02/12/2017    Lab Results  Component Value Date   NA 137 02/12/2017   K 4.2 02/12/2017   CL 103 02/12/2017   CO2 25 02/12/2017    Assessment & Plan:   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 We will continue supportive care  Acquired pancytopenia [facility]) He had received 1 unit of blood transfusion on January 29, 2017.Repeat CBC shows decline in hemoglobin.  The patient was hypotensive.  He had received a second unit of blood on 02/11/2017 with great improvement of his hemoglobin.  Continue transfusion support as needed to keep hemoglobin above 8  Thrombocytopenia Could be due to mild nutrition Observe. There is no contraindication to remain on antiplatelet agents or anticoagulants as long as the  platelet is greater than 50,000.  Oral thrush Could be due to malnutrition and chronic steroid therapy. He is on nystatin suspension and lidocaine as needed for mucositis pain  Cancer associated pain The patient has intermittent rib pain and now epigastric pain He is on maximum dose of PPI We will give him intermittent IV morphine as needed  Dysphagia, leaking around feeding tube, resolved Dysphagia is stable He was started on TPN on January 22, 2017, completed by February 11, 2017 With resolution of leak, we will attempt oral intake again.  Slow wound healing around feeding tube site& GERD Appreciate care from wound care nurse Appreciate general surgery consultation last week.  Continue local wound care dressing  Severe protein calorie malnutrition He will attempt oral feeding.   We will continue on 72 hours calorie count Will consult dietitian for oral nutritional supplement We will watch out for refeeding syndrome He will continue electrolyte replacement therapy as needed I will advance to soft diet as tolerated If he is not able to make caloric intake needs, we will have further discussion about possible resumption of TPN  Sacral decubitus ulcer I encouraged the patient to walk around as much as possible  Chronic history ofInflammatory boweldisease He is on low-dose prednisone in the outpatient clinic Continue low-dose steroid therapy  Profound weakness His wife felt that the patient is too weak to go home.  The patient has progressive  decline in performance status He will continue physical therapy for now  Goals of care Overall, his prognosis is guarded.  If he is not able to eat adequately, his family will need to readdress goals of care  Discharge planning He is not ready to be discharged, with high risk of refeeding syndrome with reintroduction of oral intake and discontinuation of TPN He may need to go back on TPN if you he is unable to meet intake  orally He needed blood transfusion for support Frankly, in my opinion, he is not ready for discharge due to ongoing multiple active issues until his medical issues have stabilized  [name], MD 02/12/2017  8:38 AM

## 2017-02-12 NOTE — Progress Notes (Signed)
PROGRESS NOTE    Andres Smith  FTD:[id number] DOB: 06-26-1940 DOA: 01/17/2017 PCP: No primary care provider on file.   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 G-tube was removed.  Abdominal wall cellulitis around the previous G tube site  was being treated with antibiotics.  He was on TPN as he is unable to tolerate by mouth because when he starts eating, there is leakage of gastric contents  from the previous G tube site.  G tube leak site has healed well and he has been started on oral diet on 02/11/17.  Assessment & Plan:   Principal Problem:   Malfunction of gastrostomy tube ([place]) Active Problems:   CROHN'S DISEASE-LARGE INTESTINE   Protein-calorie malnutrition, severe   Pressure injury of skin   Anemia associated with chemotherapy   Hypokalemia   Hypomagnesemia   Orthostatic hypotension  G-tube malfunction with abdominal cellulitis:Almost healed.No leakage seen on 02/11/17 after he was given food.Started on full liquid diet.Plan is to start on soft diet but he has abdominal pain.  We will continue to monitor him.  If abdominal pain persists will order CT imaging. S/P G-tube removal by IR. Presented with cellulitis around the previous G-tube site .Cultures were negative.He finished the antibiotics course . Continue wound care around the area of the cellulitis Wound care was following.  Continue PPI General surgery was consulted for possible surgical closure of the previous G-tube site.  Recommended conservative management and hold for surgical intervention for now. Leak site healing very well.  Orthostatic hypotension: Much improved.Will taper IV fluids. Continue Midodrine  Moderate to severe protein calorie  malnutrition/failure to thrive: Patient was afraid to eat because of pain/leakage of the gastric contents from the previous feeding tube site. Oral diet has been started which will be bridged with TPN that was started on 01/22/17. Pharmacy on board.  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 Alvy Bimler .  Not a candidate for chemotherapy now.Was on Gtube which has been removed. Oncology was following here.  History of recent CVA: On Plavix.   Coronary artery disease: Status post CABG.  Stable  Hypertension: Currently blood pressure stable.  We will continue to monitor.  Crohn's disease: On IV decadron.  Continue supportive care.  Hypokalemia/hypomagnesemia: Being Supplemented .  We will continue to monitor the levels.  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 We will continue to monitor the counts and transfuse as necessary. S/P 1 U PRBC transfusion 01/30/2016 and 02/11/17 Hb today is 9.0  Generalized weakness: Physical therapy was  following.    Patient had been planned to be discharged to skilled nursing facility bu this plan might be changed now as he has tolerated the diet.  Hyperglycemia: Continue sliding scale insulin.   Stage 1 pressure ulcer: Present on the coccygeal area.  No drainage, or breakage of skin.  Continue supportive care  Right Groin Pain: Patient complained of right groin pain on ambulation.  Ordered x-ray of the right hip.  Did not show any fractures or dislocation but just showed changes consistent with ankylosing spondylitis .Patient has history of ankylosing spondylitis . Pain has improved .     DVT prophylaxis: SCD Code Status: Partial code.  No intubation.  CPR okay Family Communication: Wife on the bed side Disposition Plan: Home  with home health in 1-2 days  Consultants: Oncology  Procedures: TPN  Antimicrobials: Ceftriaxone  01/20/17-01/29/17 Vancomycin  01/17/17-01/20/17     Subjective: Patient seen and examined the bedside this morning.  Was complaining of abdominal pain since last night.  He was uncomfortable with the pain. I have discussed with the patient and family and mentioned my plan to do imagings if pain doesnt not improve. Objective: Vitals:   02/11/17 1520 02/11/17 1830 02/11/17 2215 02/12/17 0616  BP: (!) 115/50 118/69 109/67 (!) 116/59  Pulse: 72 66 (!) 52 79  Resp: 16 16 20 16   Temp: 97.8 F (36.6 C) (!) 97.5 F (36.4 C) 97.9 F (36.6 C) 98.8 F (37.1 C)  TempSrc: Oral Oral Oral Oral  SpO2: 97% 100% 99% 92%  Weight:    69.5 kg (153 lb 3.5 oz)  Height:        Intake/Output Summary (Last 24 hours) at 02/12/2017 1211 Last data filed at 02/12/2017 0610 Gross per 24 hour  Intake 302 ml  Output 1200 ml  Net -898 ml   Filed Weights   01/28/17 0410 02/04/17 0359 02/12/17 0616  Weight: 67.8 kg (149 lb 7.6 oz) 67.9 kg (149 lb 11.1 oz) 69.5 kg (153 lb 3.5 oz)    Examination:  General exam: Appears uncomfortable with abdominal pain, cachectic, chronically ill HEENT:PERRL,Oral mucosa moist, Ear/Nose normal on gross exam Respiratory system: Bilateral equal air entry, normal vesicular breath sounds, no wheezes or crackles  Cardiovascular system: S1 & S2 heard, RRR. No JVD, murmurs, rubs, gallops or clicks. Gastrointestinal system: Abdomen is nondistended, soft and nontender. No organomegaly or masses felt. Normal bowel sounds heard. Healed previous Gtube site Central nervous system: Alert and oriented. No focal neurological deficits. Extremities: No edema, no clubbing ,no cyanosis, distal peripheral pulses palpable. Skin: No rashes, lesions or ulcers,no icterus ,no pallor MSK: Muscle wasting Psychiatry: Judgement and insight appear normal. Mood & affect appropriate.       Data Reviewed: I have personally reviewed following labs and imaging studies  CBC: Recent Labs  Lab 02/06/17 0524 02/11/17 0640  02/12/17 0636  WBC 7.8 5.9 9.3  NEUTROABS  --  4.8 7.9*  HGB 8.4* 7.7* 9.0*  HCT 25.3* 23.9* 26.4*  MCV 92.3 94.5 93.0  PLT 136* 123* 465*   Basic Metabolic Panel: Recent Labs  Lab 02/07/17 0500 02/08/17 0728 02/09/17 0500 02/10/17 0500 02/11/17 0500 02/12/17 0636  NA 135 138  --  135 137 137  K 4.1 4.2  --  3.7 3.6 4.2  CL 101 101  --  104 103 103  CO2 29 31  --  27 26 25   GLUCOSE 167* 166*  --  175* 156* 185*  BUN 28* 29*  --  24* 23* 23*  CREATININE 0.65 0.79  --  0.63 0.68 0.60*  CALCIUM 9.0 9.4  --  8.5* 8.6* 8.5*  MG 1.6* 1.9 1.6* 1.6* 1.6* 1.7  PHOS 3.9 3.7  --   --  3.3 3.1   GFR: Estimated Creatinine Clearance: 74.8 mL/min (A) (by C-G formula based on SCr of 0.6 mg/dL (L)). Liver Function Tests: Recent Labs  Lab 02/07/17 0500 02/11/17 0500  AST 28 21  ALT 34 23  ALKPHOS 147* 129*  BILITOT 0.3 0.4  PROT 5.6* 4.9*  ALBUMIN 2.5* 2.1*   No results for input(s): LIPASE, AMYLASE in the last 168 hours. No results for input(s): AMMONIA in the last 168 hours. Coagulation Profile: No results for input(s): INR, PROTIME in  the last 168 hours. Cardiac Enzymes: No results for input(s): CKTOTAL, CKMB, CKMBINDEX, TROPONINI in the last 168 hours. BNP (last 3 results) No results for input(s): PROBNP in the last 8760 hours. HbA1C: No results for input(s): HGBA1C in the last 72 hours. CBG: Recent Labs  Lab 02/11/17 0803 02/11/17 1215 02/11/17 1624 02/11/17 2017 02/12/17 0031  GLUCAP 119* 188* 140* 100* 125*   Lipid Profile: Recent Labs    02/11/17 0640  TRIG 29   Thyroid Function Tests: No results for input(s): TSH, T4TOTAL, FREET4, T3FREE, THYROIDAB in the last 72 hours. Anemia Panel: No results for input(s): VITAMINB12, FOLATE, FERRITIN, TIBC, IRON, RETICCTPCT in the last 72 hours. Sepsis Labs: No results for input(s): PROCALCITON, LATICACIDVEN in the last 168 hours.  No results found for this or any previous visit (from the past 240 hour(s)).        Radiology Studies: No results found.      Scheduled Meds: . clopidogrel  75 mg Oral Daily  . dexamethasone  1 mg Intravenous Q24H  . famotidine  20 mg Oral QHS  . feeding supplement (ENSURE ENLIVE)  237 mL Oral BID BM  . insulin aspart  0-20 Units Subcutaneous Q4H  . midodrine  5 mg Oral TID WC  . multivitamin with minerals  1 tablet Oral Daily  . nystatin  5 mL Oral QID  . pantoprazole  40 mg Oral Daily  . Zinc Oxide   Topical Q4H   Continuous Infusions: . sodium chloride 125 mL/hr at 02/11/17 1407     LOS: 26 days    Time spent: 25 mins    Camillia Marcy Jodie Echevaria, MD [facility] Pager [phone]  If 7PM-7AM, please contact night-coverage [email or web address] Password [facility] 02/12/2017, 12:11 PM

## 2017-02-12 NOTE — Progress Notes (Signed)
Calorie Count Note  72 hour calorie count ordered. Day 1 results below.  Diet: Full liquids Supplements: Ensure Enlive po BID, each supplement provides 350 kcal and 20 grams of protein  2/11: Breakfast: 0% Lunch: 120 kcal, 2g protein Dinner: 60 kcal, 2g protein Supplements: 350 kcal, 20g protein  Estimated Nutritional Needs:  Kcal:  2350-2550 kcal/day Protein:  110-120 g/day  Total intake: 530 kcal (22% of minimum estimated needs)  24g protein (21% of minimum estimated needs)  Nutrition Dx: Severe Malnutrition related to chronic illness, cancer and cancer related treatments as evidenced by percent weight loss, moderate fat depletion, severe fat depletion, severe muscle depletion.  Goal: Pt to meet >/= 90% of their estimated nutrition needs   Intervention:  -Continue Calorie Count -Continue Ensure Enlive po BID, each supplement provides 350 kcal and 20 grams of protein  Clayton Bibles, MS, RD, LDN [place] Dietitian Pager: [phone] After Hours Pager: [phone]

## 2017-02-12 NOTE — Care Management Important Message (Signed)
Important Message  Patient Details  Name: Andres Smith MRN: [id number] Date of Birth: [birth year]   Medicare Important Message Given:  Yes    Kerin Salen 02/12/2017, 10:41 AMImportant Message  Patient Details  Name: Andres Smith MRN: [id number] Date of Birth: Aug 12, 1940   Medicare Important Message Given:  Yes    Kerin Salen 02/12/2017, 10:41 AM

## 2017-02-12 NOTE — Progress Notes (Signed)
PT Cancellation Note  Patient Details Name: Andres Smith MRN: [id number] DOB: Apr 30, 1940   Cancelled Treatment:    Reason Eval/Treat Not Completed: Pain limiting ability to participate; patient c/o abdominal pain, did participate enough to stand with assist to urinate and NT changed bed linens, but then right back to bed due to pain.  Will attempt another day.  No charge.   Reginia Naas 02/12/2017, 2:25 PM Magda Kiel, [place] 02/12/2017

## 2017-02-13 DIAGNOSIS — R64 Cachexia: Secondary | ICD-10-CM

## 2017-02-13 LAB — COMPREHENSIVE METABOLIC PANEL
ALBUMIN: 2.1 g/dL — AB (ref 3.5–5.0)
ALT: 47 U/L (ref 17–63)
AST: 42 U/L — AB (ref 15–41)
Alkaline Phosphatase: 161 U/L — ABNORMAL HIGH (ref 38–126)
Anion gap: 7 (ref 5–15)
BUN: 17 mg/dL (ref 6–20)
CHLORIDE: 100 mmol/L — AB (ref 101–111)
CO2: 24 mmol/L (ref 22–32)
Calcium: 8.4 mg/dL — ABNORMAL LOW (ref 8.9–10.3)
Creatinine, Ser: 0.64 mg/dL (ref 0.61–1.24)
GFR calc Af Amer: >60 mL/min (ref >60)
GFR calc non Af Amer: >60 mL/min (ref >60)
GLUCOSE: 184 mg/dL — AB (ref 65–99)
POTASSIUM: 4 mmol/L (ref 3.5–5.1)
Sodium: 131 mmol/L — ABNORMAL LOW (ref 135–145)
Total Bilirubin: 2.4 mg/dL — ABNORMAL HIGH (ref 0.3–1.2)
Total Protein: 5.4 g/dL — ABNORMAL LOW (ref 6.5–8.1)

## 2017-02-13 LAB — GLUCOSE, CAPILLARY
GLUCOSE-CAPILLARY: 145 mg/dL — AB (ref 65–99)
GLUCOSE-CAPILLARY: 126 mg/dL — AB (ref 65–99)
GLUCOSE-CAPILLARY: 150 mg/dL — AB (ref 65–99)
GLUCOSE-CAPILLARY: 156 mg/dL — AB (ref 65–99)
GLUCOSE-CAPILLARY: 138 mg/dL — AB (ref 65–99)
Glucose-Capillary: 136 mg/dL — ABNORMAL HIGH (ref 65–99)
Glucose-Capillary: 158 mg/dL — ABNORMAL HIGH (ref 65–99)
Glucose-Capillary: 160 mg/dL — ABNORMAL HIGH (ref 65–99)
Glucose-Capillary: 167 mg/dL — ABNORMAL HIGH (ref 65–99)
Glucose-Capillary: 86 mg/dL (ref 65–99)
Glucose-Capillary: 174 mg/dL — ABNORMAL HIGH (ref 65–99)

## 2017-02-13 LAB — MAGNESIUM: Magnesium: 2 mg/dL (ref 1.7–2.4)

## 2017-02-13 LAB — CBC WITH DIFFERENTIAL/PLATELET
BASOS ABS: 0 10*3/uL (ref 0.0–0.1)
Basophils Relative: 0 %
EOS ABS: 0.1 10*3/uL (ref 0.0–0.7)
EOS PCT: 0 %
HCT: 28.3 % — ABNORMAL LOW (ref 39.0–52.0)
Hemoglobin: 9.7 g/dL — ABNORMAL LOW (ref 13.0–17.0)
LYMPHS PCT: 8 %
Lymphs Abs: 1 10*3/uL (ref 0.7–4.0)
MCH: 31.6 pg (ref 26.0–34.0)
MCHC: 34.3 g/dL (ref 30.0–36.0)
MCV: 92.2 fL (ref 78.0–100.0)
MONO ABS: 0.7 10*3/uL (ref 0.1–1.0)
Monocytes Relative: 6 %
Neutro Abs: 11.3 10*3/uL — ABNORMAL HIGH (ref 1.7–7.7)
Neutrophils Relative %: 86 %
PLATELETS: 141 10*3/uL — AB (ref 150–400)
RBC: 3.07 MIL/uL — ABNORMAL LOW (ref 4.22–5.81)
RDW: 16.1 % — AB (ref 11.5–15.5)
WBC: 13.1 10*3/uL — AB (ref 4.0–10.5)

## 2017-02-13 LAB — PHOSPHORUS: PHOSPHORUS: 2.6 mg/dL (ref 2.5–4.6)

## 2017-02-13 MED ORDER — HALOPERIDOL LACTATE 2 MG/ML PO CONC
0.5000 mg | ORAL | Status: DC | PRN
  Filled 2017-02-13: qty 0.3

## 2017-02-13 MED ORDER — FENTANYL 25 MCG/HR TD PT72
25.0000 ug | MEDICATED_PATCH | TRANSDERMAL | Status: DC
  Administered 2017-02-13: 25 ug via TRANSDERMAL
  Filled 2017-02-13: qty 1

## 2017-02-13 MED ORDER — HALOPERIDOL 0.5 MG PO TABS: 0.5000 mg | ORAL_TABLET | ORAL | Status: DC | PRN

## 2017-02-13 MED ORDER — MORPHINE SULFATE (CONCENTRATE) 10 MG/0.5ML PO SOLN: 5.0000 mg | ORAL | Status: DC | PRN

## 2017-02-13 MED ORDER — LORAZEPAM 2 MG/ML IJ SOLN: 1.0000 mg | INTRAMUSCULAR | Status: DC | PRN

## 2017-02-13 MED ORDER — HALOPERIDOL LACTATE 5 MG/ML IJ SOLN: 0.5000 mg | INTRAMUSCULAR | Status: DC | PRN

## 2017-02-13 MED ORDER — LORAZEPAM 2 MG/ML PO CONC: 1.0000 mg | ORAL | Status: DC | PRN

## 2017-02-13 MED ORDER — LORAZEPAM 1 MG PO TABS: 1.0000 mg | ORAL_TABLET | ORAL | Status: DC | PRN

## 2017-02-13 NOTE — Progress Notes (Signed)
I spent an additional 40 minutes with the patient and family members to discuss goals of care We discussed prognosis, estimated longevity to be less than 2 weeks, likely to be days if the patient haa minimum oral intake We discussed initiation of comfort set measures/palliative care order set for comfort I would discontinue all unnecessary medications and blood tests We discussed the risk and benefit of home based hospice care versus inpatient facility care Ultimately, the patient and his wife had made informed decision to transition his care to [facility] place if bed is available I have alerted nursing staff to contact social worker to look for bed placement The patient agreed for Foley catheter placement I will start him on low-dose fentanyl patch.  It is not clear to me that he would have meaningful absorption due to severe cachexia He will continue to have as needed medications for pain and agitation

## 2017-02-13 NOTE — Progress Notes (Signed)
[facility]  Received request from [place] for family interest in [facility]. Chart reviewed and met with spouse Benjamine Mola to acknowledge referral and answer questions. She is aware [facility] does not have a room to offer this afternoon. [facility] will update CSW and family in am.   Thank you,  Erling Conte, LCSW [phone]

## 2017-02-13 NOTE — Progress Notes (Signed)
PT Cancellation/Discharge Note  Patient Details Name: Andres Smith MRN: [id number] DOB: [birth year]   Cancelled Treatment:    Reason Eval/Treat Not Completed: Other (comment); noted change in focus of care and MD discontinued PT.  Will sign off at this time.    Reginia Naas 02/13/2017, 1:35 PM  Magda Kiel, [place] 02/13/2017

## 2017-02-13 NOTE — Clinical Social Work Note (Signed)
Clinical Social Work Assessment  Patient Details  Name: Andres Smith MRN: [id number] Date of Birth: [birth year]  Date of referral:  02/13/17               Reason for consult:                   Permission sought to share information with:  Chartered certified accountant granted to share information::     Name::        Agency::  [facility]  Relationship::  Spouse   Contact Information:     Housing/Transportation Living arrangements for the past 2 months:  Single Family Home Source of Information:  Spouse Patient Interpreter Needed:  None Criminal Activity/Legal Involvement Pertinent to Current Situation/Hospitalization:  No - Comment as needed Significant Relationships:  Adult Children, Spouse, Other Family Members Lives with:  Spouse Do you feel safe going back to the place where you live?  No Need for family participation in patient care:  Yes (Comment)(Residential Hospice)  Care giving concerns:   Residential Hospice  Social Worker assessment / plan:  CSW met with patient spouse and other family members at bedside, explained role and reason for visit- to assist with the pt. transition patient to residential hospice. Spouse and family are agreeable to [facility]. CSW explain the referral process to [facility] liaison, spouse reports understanding.   CSW informed Harmon Pier, [facility] Education officer, museum.  She will follow up with the patient and family. CSW will follow and assist with additional needs.  Plan:Residential Hospice.   Employment status:    Insurance information:    PT Recommendations:    Information / Referral to community resources:     Patient/Family's Response to care:  Patient and family agreeable to transition.  Patient/Family's Understanding of and Emotional Response to Diagnosis, Current Treatment, and Prognosis: Patient spouse reports, "needing time to process everything."  Emotional Assessment Appearance:    Patient appears comfortable surrounded by family and  friends.  Attitude/Demeanor/Rapport:    Affect (typically observed):    Orientation:  Oriented to Self, Oriented to Place, Oriented to  Time, Oriented to Situation Alcohol / Substance use:  Not Applicable Psych involvement (Current and /or in the community):  No (Comment)  Discharge Needs  Concerns to be addressed:  Discharge Planning Concerns Readmission within the last 30 days:  No Current discharge risk:  Terminally ill Barriers to Discharge:  Hospice Bed not available   Lia Hopping, LCSW 02/13/2017, 1:32 PM

## 2017-02-13 NOTE — Telephone Encounter (Signed)
PA and appeal was denied.

## 2017-02-13 NOTE — Progress Notes (Signed)
PER BEAGLEY   DOB:[birth year]   KV#:[id number]    Subjective: Patient is weak.  Not eating.  Complaining of severe abdominal pain when he tries to eat.  Continues to complain of hip pain.  CT scan last night showed significant metastatic disease  Objective:  Vitals:   02/12/17 2025 02/13/17 0640  BP: 138/69 103/63  Pulse: 87 89  Resp: 20 20  Temp: 98.9 F (37.2 C) 99.1 F (37.3 C)  SpO2: 95% 98%     Intake/Output Summary (Last 24 hours) at 02/13/2017 0825 Last data filed at 02/12/2017 2352 Gross per 24 hour  Intake 240 ml  Output 525 ml  Net -285 ml    GENERAL: He is sleeping, appears very frail   Labs:  Lab Results  Component Value Date   WBC 13.1 (H) 02/13/2017   HGB 9.7 (L) 02/13/2017   HCT 28.3 (L) 02/13/2017   MCV 92.2 02/13/2017   PLT 141 (L) 02/13/2017   NEUTROABS 11.3 (H) 02/13/2017    Lab Results  Component Value Date   NA 131 (L) 02/13/2017   K 4.0 02/13/2017   CL 100 (L) 02/13/2017   CO2 24 02/13/2017    Studies: I have personally reviewed the CT imaging with his wife Ct Abdomen Pelvis Wo Contrast  Addendum Date: 02/12/2017   ADDENDUM REPORT: 02/12/2017 19:44 ADDENDUM: Results discussed with Al Saison patient's nurse with request to call physician on call. Recommended gallbladder ultrasound if clinical suspicion of cholecystitis. Also discussed results of progressive tumor. Electronically Signed   By: Genia Del M.D.   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 Recent G-tube removal secondary to possible abdominal wall cellulitis. Initial encounter. EXAM: CT ABDOMEN AND PELVIS WITHOUT CONTRAST TECHNIQUE: Multidetector CT imaging of the abdomen and pelvis was performed following the standard protocol without IV contrast. COMPARISON:  11/26/2016 PET-CT.  05/21/2016 CT abdomen and pelvis. FINDINGS: Lower chest: Increase in size and interval cavitation right middle lobe 1.9  cm nodule previously 1.3 cm. Enlarging medial right middle lobe nodule now measuring up to 1.8 cm versus prior 0.9 cm. Enlarging right lower lobe nodule now 1.7 cm versus prior 1.5 cm. New right-sided pleural effusion/pleural thickening. New left lower lobe 1.7 cm cystic appearing lesion with 6 mm central nodule. Heart size within normal limits. Prominent 3 vessel coronary artery calcification. Mitral valve calcification post CABG. Hepatobiliary: New anterior right lobe liver 1.6 cm lesion. Prominent size gallbladder containing gallstones. Pericholecystic fluid and sludge noted. In the proper clinical setting, cholecystitis could not be excluded. Clinical correlation recommended. Pancreas: Taking into account limitation by non contrast imaging, no pancreatic mass or inflammation. Spleen: Several new splenic low-density lesions measuring up to 1.6 cm consistent with metastatic disease. Adrenals/Urinary Tract: No obstructing stone or hydronephrosis. Bilateral renal cyst measuring up to 5.5 cm. Taking into account limitation by non contrast imaging, no worrisome renal or adrenal mass. Noncontrast filled imaging of the urinary bladder without gross abnormality. Stomach/Bowel: Gastrostomy site visualized. The surrounding haziness of fat planes may represent normal findings versus mild inflammation but without drainable abscess. Fluid-filled loops of colon and small bowel without extraluminal bowel inflammatory process. Appendix not visualized. Vascular/Lymphatic: Atherosclerotic changes aorta without aneurysm. Atherosclerotic changes aortic branch vessels. Small porta hepatis, celiac axis adenopathy with short axis dimension of 1.2 cm. Reproductive: Slightly prominent prostate gland. Other: Fat containing inguinal hernias. No bowel containing hernia. No free intraperitoneal air. Fall Musculoskeletal:  Enlarging metastatic lesion involving lateral aspect the right eighth rib now measuring 6.2 x 4 cm versus prior 3.6 x 3.3  cm. Enlarging destructive metastatic lesion involving the right ilium now extending into the surrounding musculature measuring 4.1 x 4.4 x 4.8 cm. Ankylosis thoracic and upper lumbar spine. IMPRESSION: Prominent size gallbladder containing gallstones. Pericholecystic fluid and sludge noted. In the proper clinical setting, cholecystitis could not be excluded. Clinical correlation recommended. Gastrostomy site visualized with haziness of surrounding fat planes which may represent normal finding versus mild inflammation but without drainable abscess. Fluid-filled loops of colon and small bowel without extraluminal bowel inflammatory process. Appendix not visualized. Progressive metastatic disease involving lung bases bilaterally, right eighth rib, liver, spleen and right ilium as detailed above. Aortic Atherosclerosis (ICD10-I70.0). Prominent coronary artery calcifications post CABG. Ankylosis thoracic and lumbar spine sparing the lower lumbar region. Call is into provided. Electronically Signed: By: Genia Del M.D. On: 02/12/2017 18:49    Assessment & Plan:   Diffuse metastatic cancer Severe protein calorie malnutrition Severe cancer pain Poor performance status, estimated ECOG PS of 4  I have a long discussion with his wife this morning.  I will attempt to come back at around 11:00 for family meeting to discuss the goals of care and discharge planning.  In my opinion, he will likely succumb to the disease fairly soon.  He is too weak to undergo further systemic treatment.  I would not recommend further parenteral nutrition.  I will allow him to eat as desired.  It appears that food appears to aggravate his pain even more. I talked to his wife and recommend changing his CODE STATUS to DO NOT RESUSCITATE I have discussed that with primary service to look for potential home based hospice versus residential hospice I estimated his prognosis to be very poor and he likely have less than 2 weeks to live if  the current trajectory of his disease process continues I will issue an addendum after family meeting later this morning   [name], MD 02/13/2017  8:25 AM

## 2017-02-13 NOTE — Progress Notes (Signed)
Nutrition Brief Note  Chart reviewed. Pt now transitioning to comfort care. Calorie Count d/c. No further nutrition interventions warranted at this time.   Clayton Bibles, MS, RD, [employer] Diet[employer] Pager: [phone] After Hours Pager: [phone]

## 2017-02-13 NOTE — Progress Notes (Addendum)
PROGRESS NOTE    Andres Smith  IZT:[id number] DOB: February 03, 1940 DOA: 01/17/2017 PCP: No primary care provider on file.   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 G-tube was removed.  Abdominal wall cellulitis around the previous G tube site  was being treated with antibiotics.  He was on TPN as he is unable to tolerate by mouth because when he starts eating, there is leakage of gastric contents  from the previous G tube site.  G tube leak site has healed well and he was started on oral diet on 02/11/17. Patient started having severe abdominal pain since 02/12/17.  CT abdomen/pelvis showed possible acute cholecystitis, progressed metastatic lesions  lungs, spleen, right eighth rib and also showed enlarging destructive metastatic lesion on the right ilium. Discussed with patient and family this morning and updated the current finding.  Patient made DNI/DNR.  Plan is to initiate the comfort care and discharge the patient to residential hospice versus hospice at home.  Assessment & Plan:   Principal Problem:   Malfunction of gastrostomy tube ([place]) Active Problems:   CROHN'S DISEASE-LARGE INTESTINE   Protein-calorie malnutrition, severe   Pressure injury of skin   Anemia associated with chemotherapy   Hypokalemia   Hypomagnesemia   Orthostatic hypotension   Acute cholecystitis:Aggressive treatment wont  be pursued.Currently NPO.On comfort care  G-tube malfunction with abdominal cellulitis:Almost healed.No leakage seen on 02/11/17 after he was given foodS/P G-tube removal by IR. Presented with cellulitis around the previous G-tube site .Cultures were negative.He finished the antibiotics course .  Moderate to severe protein calorie  malnutrition/failure to thrive: On comfort care now  Buccal mucosa Squamous cell carcinoma with dysphagia: Follows with oncology Dr. Alvy Bimler .  Not a candidate for chemotherapy now.Was on Gtube which has been removed. Oncology was following here.  History of recent CVA/Coronary artery disease/Hypertension/Crohn's disease/ Pancytopemia : All meds D/Ced  Right Groin Pain: Patient complained of right groin pain on ambulation.  Ordered x-ray of the right hip.  Did not show any fractures or dislocation but just showed changes consistent with ankylosing spondylitis .CT- showed destructive metastatic  lesion on the right iliac bone    DVT prophylaxis: None Code Status: DNR Family Communication: Wife on the bed side Disposition Plan: Residential hospice versus hospice at home  Consultants: Oncology  Procedures: TPN  Antimicrobials: Ceftriaxone  01/20/17-01/29/17 Vancomycin 01/17/17-01/20/17     Subjective: Patient seen and examined the bedside this morning.  Was complaining of abdominal pain since yesterday.  He was uncomfortable with the pain. I have discussed with the family and we discussed about initiating hospice care.  Objective: Vitals:   02/12/17 1500 02/12/17 2025 02/13/17 0640 02/13/17 0900  BP: 137/74 138/69 103/63 104/60  Pulse: 79 87 89 80  Resp: 16 20 20 20   Temp: 98.8 F (37.1 C) 98.9 F (37.2 C) 99.1 F (37.3 C) 98.6 F (37 C)  TempSrc: Oral Oral Oral Oral  SpO2: 96% 95% 98% 98%  Weight:      Height:        Intake/Output Summary (Last 24 hours) at 02/13/2017 1303 Last data filed at 02/13/2017 0900 Gross per 24 hour  Intake 240 ml  Output 725 ml  Net -485 ml   Filed Weights   01/28/17 0410 02/04/17 0359 02/12/17 3825  Weight: 67.8 kg (149 lb 7.6 oz) 67.9 kg (149 lb 11.1 oz) 69.5 kg (153 lb 3.5 oz)    Examination:  General exam: Appears uncomfortable with abdominal pain, cachectic, chronically ill HEENT:PERRL,Oral mucosa moist, Ear/Nose normal on  gross exam Respiratory system: Bilateral equal air entry, normal vesicular breath sounds, no wheezes or crackles  Cardiovascular system: S1 & S2 heard, RRR. No JVD, murmurs, rubs, gallops or clicks. Gastrointestinal system: Abdomen is nondistended, soft , tenderness on the right upper quadrant  Central nervous system: lethargic Extremities: No edema, no clubbing ,no cyanosis, distal peripheral pulses palpable. Skin: No rashes, lesions or ulcers,no icterus ,no pallor MSK: Muscle wasting  Data Reviewed: I have personally reviewed following labs and imaging studies  CBC: Recent Labs  Lab 02/11/17 0640 02/12/17 0636 02/13/17 0753  WBC 5.9 9.3 13.1*  NEUTROABS 4.8 7.9* 11.3*  HGB 7.7* 9.0* 9.7*  HCT 23.9* 26.4* 28.3*  MCV 94.5 93.0 92.2  PLT 123* 123* 854*   Basic Metabolic Panel: Recent Labs  Lab 02/07/17 0500 02/08/17 0728 02/09/17 0500 02/10/17 0500 02/11/17 0500 02/12/17 0636 02/13/17 0500  NA 135 138  --  135 137 137 131*  K 4.1 4.2  --  3.7 3.6 4.2 4.0  CL 101 101  --  104 103 103 100*  CO2 29 31  --  27 26 25 24   GLUCOSE 167* 166*  --  175* 156* 185* 184*  BUN 28* 29*  --  24* 23* 23* 17  CREATININE 0.65 0.79  --  0.63 0.68 0.60* 0.64  CALCIUM 9.0 9.4  --  8.5* 8.6* 8.5* 8.4*  MG 1.6* 1.9 1.6* 1.6* 1.6* 1.7 2.0  PHOS 3.9 3.7  --   --  3.3 3.1 2.6   GFR: Estimated Creatinine Clearance: 74.8 mL/min (by C-G formula based on SCr of 0.64 mg/dL). Liver Function Tests: Recent Labs  Lab 02/07/17 0500 02/11/17 0500 02/13/17 0500  AST 28 21 42*  ALT 34 23 47  ALKPHOS 147* 129* 161*  BILITOT 0.3 0.4 2.4*  PROT 5.6* 4.9* 5.4*  ALBUMIN 2.5* 2.1* 2.1*   No results for input(s): LIPASE, AMYLASE in the last 168 hours. No results for input(s): AMMONIA in the last 168 hours. Coagulation Profile: No results for input(s): INR, PROTIME in the last 168 hours. Cardiac Enzymes: No results for input(s): CKTOTAL, CKMB, CKMBINDEX, TROPONINI in the last 168 hours. BNP (last 3  results) No results for input(s): PROBNP in the last 8760 hours. HbA1C: No results for input(s): HGBA1C in the last 72 hours. CBG: Recent Labs  Lab 02/12/17 2018 02/13/17 0019 02/13/17 0419 02/13/17 0830 02/13/17 1220  GLUCAP 167* 136* 160* 150* 174*   Lipid Profile: Recent Labs    02/11/17 0640  TRIG 29   Thyroid Function Tests: No results for input(s): TSH, T4TOTAL, FREET4, T3FREE, THYROIDAB in the last 72 hours. Anemia Panel: No results for input(s): VITAMINB12, FOLATE, FERRITIN, TIBC, IRON, RETICCTPCT in the last 72 hours. Sepsis Labs: No results for input(s): PROCALCITON, LATICACIDVEN in the last 168 hours.  No results found for this or any previous visit (from the past 240 hour(s)).       Radiology Studies: Ct Abdomen Pelvis Wo Contrast  Addendum Date: 02/12/2017   ADDENDUM REPORT: 02/12/2017 19:44 ADDENDUM: Results discussed with Al Saison patient's nurse with request to call physician on call. Recommended gallbladder ultrasound if clinical suspicion of cholecystitis. Also discussed results of progressive tumor. Electronically Signed   By: Alcide Evener.D.  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 Recent G-tube removal secondary to possible abdominal wall cellulitis. Initial encounter. EXAM: CT ABDOMEN AND PELVIS WITHOUT CONTRAST TECHNIQUE: Multidetector CT imaging of the abdomen and pelvis was performed following the standard protocol without IV contrast. COMPARISON:  11/26/2016 PET-CT.  05/21/2016 CT abdomen and pelvis. FINDINGS: Lower chest: Increase in size and interval cavitation right middle lobe 1.9 cm nodule previously 1.3 cm. Enlarging medial right middle lobe nodule now measuring up to 1.8 cm versus prior 0.9 cm. Enlarging right lower lobe nodule now 1.7 cm versus prior 1.5 cm. New right-sided pleural effusion/pleural thickening. New left lower lobe 1.7 cm cystic  appearing lesion with 6 mm central nodule. Heart size within normal limits. Prominent 3 vessel coronary artery calcification. Mitral valve calcification post CABG. Hepatobiliary: New anterior right lobe liver 1.6 cm lesion. Prominent size gallbladder containing gallstones. Pericholecystic fluid and sludge noted. In the proper clinical setting, cholecystitis could not be excluded. Clinical correlation recommended. Pancreas: Taking into account limitation by non contrast imaging, no pancreatic mass or inflammation. Spleen: Several new splenic low-density lesions measuring up to 1.6 cm consistent with metastatic disease. Adrenals/Urinary Tract: No obstructing stone or hydronephrosis. Bilateral renal cyst measuring up to 5.5 cm. Taking into account limitation by non contrast imaging, no worrisome renal or adrenal mass. Noncontrast filled imaging of the urinary bladder without gross abnormality. Stomach/Bowel: Gastrostomy site visualized. The surrounding haziness of fat planes may represent normal findings versus mild inflammation but without drainable abscess. Fluid-filled loops of colon and small bowel without extraluminal bowel inflammatory process. Appendix not visualized. Vascular/Lymphatic: Atherosclerotic changes aorta without aneurysm. Atherosclerotic changes aortic branch vessels. Small porta hepatis, celiac axis adenopathy with short axis dimension of 1.2 cm. Reproductive: Slightly prominent prostate gland. Other: Fat containing inguinal hernias. No bowel containing hernia. No free intraperitoneal air. Fall Musculoskeletal: Enlarging metastatic lesion involving lateral aspect the right eighth rib now measuring 6.2 x 4 cm versus prior 3.6 x 3.3 cm. Enlarging destructive metastatic lesion involving the right ilium now extending into the surrounding musculature measuring 4.1 x 4.4 x 4.8 cm. Ankylosis thoracic and upper lumbar spine. IMPRESSION: Prominent size gallbladder containing gallstones. Pericholecystic  fluid and sludge noted. In the proper clinical setting, cholecystitis could not be excluded. Clinical correlation recommended. Gastrostomy site visualized with haziness of surrounding fat planes which may represent normal finding versus mild inflammation but without drainable abscess. Fluid-filled loops of colon and small bowel without extraluminal bowel inflammatory process. Appendix not visualized. Progressive metastatic disease involving lung bases bilaterally, right eighth rib, liver, spleen and right ilium as detailed above. Aortic Atherosclerosis (ICD10-I70.0). Prominent coronary artery calcifications post CABG. Ankylosis thoracic and lumbar spine sparing the lower lumbar region. Call is into provided. Electronically Signed: By: Genia Del M.D. On: 02/12/2017 18:49        Scheduled Meds: . fentaNYL  25 mcg Transdermal Q72H  . Zinc Oxide   Topical Q4H   Continuous Infusions:    LOS: 27 days    Time spent: 25 mins    Karon Heckendorn Jodie Echevaria, MD [facility] Pager [phone]  If 7PM-7AM, please contact night-coverage [email or web address] Password [facility] 02/13/2017, 1:03 PM

## 2017-02-14 LAB — GLUCOSE, CAPILLARY
GLUCOSE-CAPILLARY: 181 mg/dL — AB (ref 65–99)
GLUCOSE-CAPILLARY: 153 mg/dL — AB (ref 65–99)
Glucose-Capillary: 166 mg/dL — ABNORMAL HIGH (ref 65–99)

## 2017-02-14 NOTE — Progress Notes (Signed)
Pt discharged with plan to admit to [facility] for residential hospice care.  Family at bedside- pt and family completed hospice paperwork with [facility] liaison this morning. Report [phone] Arranged [facility] transportation to [employer].  Sharren Bridge, MSW, LCSW Clinical Social Work 02/14/2017 [phone]

## 2017-02-14 NOTE — Progress Notes (Signed)
[facility] Liaison: RN visit  Received request from [place] for family interest [facility] with request for transfer today. Chart reviewed. Met with wife, Andres Smith and children, Andres Smith and Andres Smith to confirm interest and explain services. Family agreeable to transfer today. Sharren Bridge, CSW aware. Registration paper work completed. Dr. Orpah Melter to assume care per family request. Please fax discharge summary to [phone]. RN please call report to [phone]. Please arrange transport for patient to arrive as soon as possible.  Please call with any hospice related questions.  Thank you for this referral.  Farrel Gordon, RN, [facility] Liaison [facility] liaisons are on [place] -

## 2017-02-14 NOTE — Discharge Summary (Signed)
Physician Discharge Summary  Andres Smith JJK:[id number] DOB: Apr 08, 1940 DOA: 01/17/2017  PCP: No primary care provider on file.  Admit date: 01/17/2017 Discharge date: 02/14/2017  Admitted From: Home Disposition:Hospice   Discharge Condition:Hospice CODE STATUS: DNR   Brief/Interim Summary: 77 year old male with past medical history of buccal mucosa squamous cell cancer, dysphagia with G-tube in place, atrial fibrillation, coronary artery disease status post CABG, recent CVA was admitted for the evaluation of malfunction of his G-tube and possible abdominal cellulitis. G-tube was removed. Abdominal wall cellulitis around the previous G tube site was being treated with antibiotics. He was on TPN as he is unable to tolerate by mouth because when he starts eating, there is leakage of gastric contents from the previous G tube site.  G tube leak site has healed well and he was started on oral diet on 02/11/17. Patient started having severe abdominal pain since 02/12/17.  CT abdomen/pelvis showed possible acute cholecystitis, progressed metastatic lesions  lungs, spleen, right eighth rib and also showed enlarging destructive metastatic lesion on the right ilium. Discussed with patient and family this morning and updated the current finding.  Patient made DNI/DNR.   Patient is being  discharged to hospice today.  Following problems were addressed during his hospitalization:   Acute cholecystitis:Aggressive treatment wont  be pursued.Currently NPO.On comfort care  G-tube malfunction with abdominal cellulitis:Almost healed.No leakage seen on 02/11/17 after he was given foodS/P G-tube removalby IR. Presented with cellulitis around the previous G-tube site .Cultures were negative.Hefinishedthe antibiotics course .  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 Alvy Bimler .  Not a candidate for chemotherapy now.Was on Gtube which has been removed. Oncology was following here.  History of recent CVA/Coronary artery disease/Hypertension/Crohn's disease/ Pancytopemia: All meds D/Ced  Right Groin Pain: Patient complained of right groin pain on ambulation.  Ordered x-ray of the right hip.  Did not show any fractures or dislocation but just showed changes consistent with ankylosing spondylitis .CT- showed destructive metastatic  lesion on the right iliac bone     Discharge Diagnoses:  Principal Problem:   Malfunction of gastrostomy tube ([place]) Active Problems:   CROHN'S DISEASE-LARGE INTESTINE   Protein-calorie malnutrition, severe   Pressure injury of skin   Anemia associated with chemotherapy   Hypokalemia   Hypomagnesemia   Orthostatic hypotension    Discharge Instructions  Discharge Instructions    AMB Referral to [facility]   Complete by:  As directed    Please assign [employer] member to [place] for transition of care. Likely discharge soon. Will have [place] with the [place]. Written consent obtained. Please contact with questions. Thanks. Marthenia Rolling, [place], RN,BSN-[facility] [phone]   Reason for consult:  Please assign to [facility] RNCM   Expected date of contact:  1-3 days (reserved for hospital discharges)   Diet - low sodium heart healthy   Complete by:  As directed    Increase activity slowly   Complete by:  As directed      Allergies as of 02/14/2017   No Known Allergies     Medication List    STOP taking these medications   atorvastatin 80 MG tablet Commonly known as:  LIPITOR   balsalazide 750 MG capsule Commonly known as:  COLAZAL   clopidogrel 75 MG tablet Commonly known as:  PLAVIX   cyanocobalamin 1000 MCG/ML injection  Commonly known as:  (VITAMIN B-12)   dronabinol 2.5 MG capsule Commonly known as:  MARINOL   feeding supplement  (OSMOLITE 1.5 CAL) Liqd   isosorbide mononitrate 20 MG tablet Commonly known as:  ISMO,MONOKET   metFORMIN 500 MG tablet Commonly known as:  GLUCOPHAGE   nitroGLYCERIN 0.4 MG SL tablet Commonly known as:  NITROSTAT   ondansetron 8 MG tablet Commonly known as:  ZOFRAN   predniSONE 5 MG tablet Commonly known as:  DELTASONE   promethazine 25 MG tablet Commonly known as:  PHENERGAN   traMADol 50 MG tablet Commonly known as:  ULTRAM       No Known Allergies  Consultations: Oncology  Procedures/Studies: Ct Abdomen Pelvis Wo Contrast  Addendum Date: 02/12/2017   ADDENDUM REPORT: 02/12/2017 19:44 ADDENDUM: Results discussed with Al Saison patient's nurse with request to call physician on call. Recommended gallbladder ultrasound if clinical suspicion of cholecystitis. Also discussed results of progressive tumor. Electronically Signed   By: Genia Del M.D.   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 Recent G-tube removal secondary to possible abdominal wall cellulitis. Initial encounter. EXAM: CT ABDOMEN AND PELVIS WITHOUT CONTRAST TECHNIQUE: Multidetector CT imaging of the abdomen and pelvis was performed following the standard protocol without IV contrast. COMPARISON:  11/26/2016 PET-CT.  05/21/2016 CT abdomen and pelvis. FINDINGS: Lower chest: Increase in size and interval cavitation right middle lobe 1.9 cm nodule previously 1.3 cm. Enlarging medial right middle lobe nodule now measuring up to 1.8 cm versus prior 0.9 cm. Enlarging right lower lobe nodule now 1.7 cm versus prior 1.5 cm. New right-sided pleural effusion/pleural thickening. New left lower lobe 1.7 cm cystic appearing lesion with 6 mm central nodule. Heart size within normal limits. Prominent 3 vessel coronary artery calcification. Mitral valve calcification post CABG. Hepatobiliary: New anterior right lobe liver 1.6 cm lesion.  Prominent size gallbladder containing gallstones. Pericholecystic fluid and sludge noted. In the proper clinical setting, cholecystitis could not be excluded. Clinical correlation recommended. Pancreas: Taking into account limitation by non contrast imaging, no pancreatic mass or inflammation. Spleen: Several new splenic low-density lesions measuring up to 1.6 cm consistent with metastatic disease. Adrenals/Urinary Tract: No obstructing stone or hydronephrosis. Bilateral renal cyst measuring up to 5.5 cm. Taking into account limitation by non contrast imaging, no worrisome renal or adrenal mass. Noncontrast filled imaging of the urinary bladder without gross abnormality. Stomach/Bowel: Gastrostomy site visualized. The surrounding haziness of fat planes may represent normal findings versus mild inflammation but without drainable abscess. Fluid-filled loops of colon and small bowel without extraluminal bowel inflammatory process. Appendix not visualized. Vascular/Lymphatic: Atherosclerotic changes aorta without aneurysm. Atherosclerotic changes aortic branch vessels. Small porta hepatis, celiac axis adenopathy with short axis dimension of 1.2 cm. Reproductive: Slightly prominent prostate gland. Other: Fat containing inguinal hernias. No bowel containing hernia. No free intraperitoneal air. Fall Musculoskeletal: Enlarging metastatic lesion involving lateral aspect the right eighth rib now measuring 6.2 x 4 cm versus prior 3.6 x 3.3 cm. Enlarging destructive metastatic lesion involving the right ilium now extending into the surrounding musculature measuring 4.1 x 4.4 x 4.8 cm. Ankylosis thoracic and upper lumbar spine. IMPRESSION: Prominent size gallbladder containing gallstones. Pericholecystic fluid and sludge noted. In the proper clinical setting, cholecystitis could not be excluded. Clinical correlation recommended. Gastrostomy site visualized with haziness of surrounding fat planes which may represent normal  finding versus mild inflammation but without drainable abscess. Fluid-filled loops  of colon and small bowel without extraluminal bowel inflammatory process. Appendix not visualized. Progressive metastatic disease involving lung bases bilaterally, right eighth rib, liver, spleen and right ilium as detailed above. Aortic Atherosclerosis (ICD10-I70.0). Prominent coronary artery calcifications post CABG. Ankylosis thoracic and lumbar spine sparing the lower lumbar region. Call is into provided. Electronically Signed: By: Genia Del M.D. On: 02/12/2017 18:49   Ir Chancy [place] Darius Bump Per W/fl Mod Sed  Result Date: 01/15/2017 INDICATION: 77 year old with persistent leakage from the gastrostomy tube that is causing severe skin irritation. Plan for tube up sizing and conversion to a GJ tube. EXAM: CONVERSION OF GASTROSTOMY TUBE TO GASTROJEJUNOSTOMY TUBE MEDICATIONS: None ANESTHESIA/SEDATION: None CONTRAST:  20 mL Isovue-300-administered into the gastric lumen. FLUOROSCOPY TIME:  Fluoroscopy Time: 3 minutes 18 seconds, 16 mGy COMPLICATIONS: None immediate. PROCEDURE: Informed written consent was obtained from the patient after a thorough discussion of the procedural risks, benefits and alternatives. All questions were addressed. Maximal Sterile Barrier Technique was utilized including caps, mask, sterile gowns, sterile gloves, sterile drape, hand hygiene and skin antiseptic. A timeout was performed prior to the initiation of the procedure. The existing 5 French gastrostomy tube was removed by deflating the balloon. There was bleeding from the tube tract. A Cobra catheter was easily advanced into the stomach and directed into the duodenum using a stiff Glidewire. A stiff Glidewire was advanced into the small bowel. A 26 French GJ tube was advanced over the wire. Catheter tip was advanced into the proximal jejunum. The gastric retention balloon was inflated with 10 mL of saline. Contrast injection confirmed the  jejunal tube is within small bowel. The gastric lumen is in the stomach. The bumper was cinched up to the skin. FINDINGS: Gastrostomy tube hole is quite large with surrounding skin irritation. GJ tube was successfully placed. The tip of the J tube is in the proximal small bowel. IMPRESSION: Successful conversion of the gastrostomy tube to a 26 [place] GJ tube. Instructed the patient to decrease oral fluid intake and substitute it for intake through his jejunal tube. If the patient has less gastric contents, hopefully, there will be less leakage and the skin around the tube will begin to heal. Wound nurse evaluated the patient yesterday and the patient and his wife were given instructions about skin care. Patient is also scheduled to meet with dietitian at the cancer center. Electronically Signed   By: Markus Daft M.D.   On: 01/15/2017 20:35   Dg Hip Unilat With Pelvis 2-3 Views Right  Result Date: 02/07/2017 CLINICAL DATA:  Right hip pain. EXAM: DG HIP (WITH OR WITHOUT PELVIS) 2-3V RIGHT COMPARISON:  CT abdomen pelvis dated May 21, 2016. FINDINGS: No acute fracture or malalignment. The bilateral hip joint spaces are preserved. 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 No focal bone lesion. Osteopenia. Soft tissues are unremarkable. IMPRESSION: 1. No acute osseous abnormality or significant degenerative changes. 2. 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 Electronically Signed   By: Titus Dubin M.D.   On: 02/07/2017 15:18   Ir Gastrostomy Tube Removal  Result Date: 01/20/2017 INDICATION: Patient with history of poor oral intake s/p gastrostomy placement in May 2018 has undergone several exchanges and upsizing of tube in an effort to control leakage without success. Most recent exchanged was on 01/15/17 at which time a 31 [place] GJ tube was placed. Request now made for tube removal. EXAM: BEDSIDE REMOVAL OF GASTROSTOMY TUBE  COMPARISON:  None. MEDICATIONS: NoNonene.  CONTRAST:  None FLUOROSCOPY TIME:  None COMPLICATIONS: None immediate. PROCEDURE: A time-out was performed prior to the initiation of the procedure. Retention balloon was deflated. 10 mL of saline removed from balloon. The existing gastrojejunostomy tube was removed intact. A dressing was placed. The patient tolerated the procedure well without immediate postprocedural complication. IMPRESSION: Successful bedside removal of 5 French GJ tube without complication. Read by: Brynda Greathouse PA-C Electronically Signed   By: Markus Daft M.D.   On: 01/20/2017 09:39    (Echo, Carotid, EGD, Colonoscopy, ERCP)    Subjective: Patient being D/Ced to hospice today  Discharge Exam: Vitals:   02/13/17 1957 02/14/17 0348  BP: 132/64 117/77  Pulse: 70 85  Resp: 16 14  Temp: 98 F (36.7 C) 98.1 F (36.7 C)  SpO2: 100% 100%   Vitals:   02/13/17 0900 02/13/17 1400 02/13/17 1957 02/14/17 0348  BP: 104/60 120/60 132/64 117/77  Pulse: 80 78 70 85  Resp: 20 20 16 14   Temp: 98.6 F (37 C) 98.4 F (36.9 C) 98 F (36.7 C) 98.1 F (36.7 C)  TempSrc: Oral Oral Oral Oral  SpO2: 98%  100% 100%  Weight:    69.5 kg (153 lb 3.5 oz)  Height:          The results of significant diagnostics from this hospitalization (including imaging, microbiology, ancillary and laboratory) are listed below for reference.     Microbiology: No results found for this or any previous visit (from the past 240 hour(s)).   Labs: BNP (last 3 results) No results for input(s): BNP in the last 8760 hours. Basic Metabolic Panel: Recent Labs  Lab 02/08/17 0728 02/09/17 0500 02/10/17 0500 02/11/17 0500 02/12/17 0636 02/13/17 0500  NA 138  --  135 137 137 131*  K 4.2  --  3.7 3.6 4.2 4.0  CL 101  --  104 103 103 100*  CO2 31  --  27 26 25 24   GLUCOSE 166*  --  175* 156* 185* 184*  BUN 29*  --  24* 23* 23* 17  CREATININE 0.79  --  0.63 0.68 0.60* 0.64  CALCIUM 9.4  --  8.5* 8.6*  8.5* 8.4*  MG 1.9 1.6* 1.6* 1.6* 1.7 2.0  PHOS 3.7  --   --  3.3 3.1 2.6   Liver Function Tests: Recent Labs  Lab 02/11/17 0500 02/13/17 0500  AST 21 42*  ALT 23 47  ALKPHOS 129* 161*  BILITOT 0.4 2.4*  PROT 4.9* 5.4*  ALBUMIN 2.1* 2.1*   No results for input(s): LIPASE, AMYLASE in the last 168 hours. No results for input(s): AMMONIA in the last 168 hours. CBC: Recent Labs  Lab 02/11/17 0640 02/12/17 0636 02/13/17 0753  WBC 5.9 9.3 13.1*  NEUTROABS 4.8 7.9* 11.3*  HGB 7.7* 9.0* 9.7*  HCT 23.9* 26.4* 28.3*  MCV 94.5 93.0 92.2  PLT 123* 123* 141*   Cardiac Enzymes: No results for input(s): CKTOTAL, CKMB, CKMBINDEX, TROPONINI in the last 168 hours. BNP: Invalid input(s): POCBNP CBG: Recent Labs  Lab 02/13/17 1638 02/13/17 1957 02/14/17 0025 02/14/17 0347 02/14/17 0749  GLUCAP 156* 138* 166* 181* 153*   D-Dimer No results for input(s): DDIMER in the last 72 hours. Hgb A1c No results for input(s): HGBA1C in the last 72 hours. Lipid Profile No results for input(s): CHOL, HDL, LDLCALC, TRIG, CHOLHDL, LDLDIRECT in the last 72 hours. Thyroid function studies No results for input(s): TSH, T4TOTAL, T3FREE, THYROIDAB in the last 72 hours.  Invalid  input(s): FREET3 Anemia work up No results for input(s): VITAMINB12, FOLATE, FERRITIN, TIBC, IRON, RETICCTPCT in the last 72 hours. Urinalysis    Component Value Date/Time   COLORURINE STRAW (A) 11/28/2016 1758   APPEARANCEUR CLEAR 11/28/2016 1758   LABSPEC 1.013 11/28/2016 1758   PHURINE 8.0 11/28/2016 1758   GLUCOSEU 50 (A) 11/28/2016 1758   GLUCOSEU NEGATIVE 05/16/2015 1621   HGBUR NEGATIVE 11/28/2016 1758   BILIRUBINUR NEGATIVE 11/28/2016 1758   KETONESUR NEGATIVE 11/28/2016 1758   PROTEINUR NEGATIVE 11/28/2016 1758   UROBILINOGEN 0.2 05/16/2015 1621   NITRITE NEGATIVE 11/28/2016 1758   LEUKOCYTESUR NEGATIVE 11/28/2016 1758   Sepsis Labs Invalid input(s): PROCALCITONIN,  WBC,  LACTICIDVEN Microbiology No  results found for this or any previous visit (from the past 240 hour(s)).   Time coordinating discharge: Over 30 minutes  SIGNED:   Marene Lenz, MD  [facility] 02/14/2017, 11:25 AM Pager [phone]  If 7PM-7AM, please contact night-coverage [email or web address] Password [email or web address]

## 2017-02-14 NOTE — Consult Note (Signed)
   [facility] CM Inpatient Consult   02/14/2017  Andres Smith Apr 15, 1940 [id number]    [facility] Care Management follow up.   Chart reviewed. Noted [name] discharged to residential [name]. [name] Care Management services not needed.   Will make [place] aware as well as [place] Management office.    Marthenia Rolling, MSN-Ed, RN,BSN [facility] Liaison [phone]

## 2017-02-14 NOTE — Consult Note (Signed)
[place] Nurse wound consult note Wound care will discontinue follow up at this time.  Conservative care, applying zinc based ointment to the peritube area as indicated is appropriate.  Thank you for inviting [place] to participate in the POC for this nice gentleman.  [place] nursing team will not follow, but will remain available to this patient, the nursing and medical teams.  Please re-consult if needed. Thanks, Maudie Flakes, MSN, RN, [place], Arther Abbott  Pager# [phone]

## 2017-02-14 NOTE — Progress Notes (Signed)
Andres Smith   DOB:07/25/1940   SP#:[id number]    Subjective: His son is around.  Foley catheter is draining dark urine.  He continues to have intermittent pain, well controlled with current prescription pain regimen.  He had minimum oral intake  Objective:  Vitals:   02/13/17 1957 02/14/17 0348  BP: 132/64 117/77  Pulse: 70 85  Resp: 16 14  Temp: 98 F (36.7 C) 98.1 F (36.7 C)  SpO2: 100% 100%     Intake/Output Summary (Last 24 hours) at 02/14/2017 6333 Last data filed at 02/14/2017 0349 Gross per 24 hour  Intake -  Output 1000 ml  Net -1000 ml    GENERAL:alert, no distress and comfortable NEURO: alert & oriented x 3 with fluent speech, no focal motor/sensory deficits   Labs:  Lab Results  Component Value Date   WBC 13.1 (H) 02/13/2017   HGB 9.7 (L) 02/13/2017   HCT 28.3 (L) 02/13/2017   MCV 92.2 02/13/2017   PLT 141 (L) 02/13/2017   NEUTROABS 11.3 (H) 02/13/2017    Lab Results  Component Value Date   NA 131 (L) 02/13/2017   K 4.0 02/13/2017   CL 100 (L) 02/13/2017   CO2 24 02/13/2017    Studies:  Ct Abdomen Pelvis Wo Contrast  Addendum Date: 02/12/2017   ADDENDUM REPORT: 02/12/2017 19:44 ADDENDUM: Results discussed with Al Saison patient's nurse with request to call physician on call. Recommended gallbladder ultrasound if clinical suspicion of cholecystitis. Also discussed results of progressive tumor. Electronically Signed   By: Genia Del M.D.   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 Recent G-tube removal secondary to possible abdominal wall cellulitis. Initial encounter. EXAM: CT ABDOMEN AND PELVIS WITHOUT CONTRAST TECHNIQUE: Multidetector CT imaging of the abdomen and pelvis was performed following the standard protocol without IV contrast. COMPARISON:  11/26/2016 PET-CT.  05/21/2016 CT abdomen and pelvis. FINDINGS: Lower chest: Increase in size and interval cavitation  right middle lobe 1.9 cm nodule previously 1.3 cm. Enlarging medial right middle lobe nodule now measuring up to 1.8 cm versus prior 0.9 cm. Enlarging right lower lobe nodule now 1.7 cm versus prior 1.5 cm. New right-sided pleural effusion/pleural thickening. New left lower lobe 1.7 cm cystic appearing lesion with 6 mm central nodule. Heart size within normal limits. Prominent 3 vessel coronary artery calcification. Mitral valve calcification post CABG. Hepatobiliary: New anterior right lobe liver 1.6 cm lesion. Prominent size gallbladder containing gallstones. Pericholecystic fluid and sludge noted. In the proper clinical setting, cholecystitis could not be excluded. Clinical correlation recommended. Pancreas: Taking into account limitation by non contrast imaging, no pancreatic mass or inflammation. Spleen: Several new splenic low-density lesions measuring up to 1.6 cm consistent with metastatic disease. Adrenals/Urinary Tract: No obstructing stone or hydronephrosis. Bilateral renal cyst measuring up to 5.5 cm. Taking into account limitation by non contrast imaging, no worrisome renal or adrenal mass. Noncontrast filled imaging of the urinary bladder without gross abnormality. Stomach/Bowel: Gastrostomy site visualized. The surrounding haziness of fat planes may represent normal findings versus mild inflammation but without drainable abscess. Fluid-filled loops of colon and small bowel without extraluminal bowel inflammatory process. Appendix not visualized. Vascular/Lymphatic: Atherosclerotic changes aorta without aneurysm. Atherosclerotic changes aortic branch vessels. Small porta hepatis, celiac axis adenopathy with short axis dimension of 1.2 cm. Reproductive: Slightly prominent prostate gland. Other: Fat containing inguinal hernias. No bowel containing hernia. No free intraperitoneal air. Fall Musculoskeletal: Enlarging  metastatic lesion involving lateral aspect the right eighth rib now measuring 6.2 x 4 cm  versus prior 3.6 x 3.3 cm. Enlarging destructive metastatic lesion involving the right ilium now extending into the surrounding musculature measuring 4.1 x 4.4 x 4.8 cm. Ankylosis thoracic and upper lumbar spine. IMPRESSION: Prominent size gallbladder containing gallstones. Pericholecystic fluid and sludge noted. In the proper clinical setting, cholecystitis could not be excluded. Clinical correlation recommended. Gastrostomy site visualized with haziness of surrounding fat planes which may represent normal finding versus mild inflammation but without drainable abscess. Fluid-filled loops of colon and small bowel without extraluminal bowel inflammatory process. Appendix not visualized. Progressive metastatic disease involving lung bases bilaterally, right eighth rib, liver, spleen and right ilium as detailed above. Aortic Atherosclerosis (ICD10-I70.0). Prominent coronary artery calcifications post CABG. Ankylosis thoracic and lumbar spine sparing the lower lumbar region. Call is into provided. Electronically Signed: By: Genia Del M.D. On: 02/12/2017 18:49    Assessment & Plan:   Diffuse metastatic cancer Severe protein calorie malnutrition Severe cancer pain Poor performance status, estimated ECOG PS of 4  I have further discussion with his son who is present today but not yesterday. We reviewed what we saw on recent CT scan. His son expressed desire to take him home with home hospice. I recommend his son to have a family meeting to make sure that the patient's needs can be met at home including 24-hour care, a safe environment and the family's willingness to administer his medications for pain control, etc. I discussed estimated prognosis.  His survival is likely measured in days, certainly less than 2 weeks, more likely less than 7 days given his minimum oral intake. I have given instruction for nursing staff to discontinue glucose monitoring If the family members are willing to take him home  with home hospice, I recommend consulting social worker and hospice program to establish home based hospice care prior to discharge.  [name], MD 02/14/2017  8:07 AM

## 2017-02-18 ENCOUNTER — Telehealth: Payer: Self-pay | Admitting: *Deleted

## 2017-02-18 NOTE — Telephone Encounter (Signed)
A user error has taken place: encounter opened in error, closed for administrative reasons.

## 2017-03-01 DEATH — deceased

## 2017-03-14 ENCOUNTER — Other Ambulatory Visit: Payer: Self-pay | Admitting: Nurse Practitioner

## 2017-03-21 ENCOUNTER — Ambulatory Visit: Payer: PPO | Admitting: Nurse Practitioner

## 2017-03-28 ENCOUNTER — Ambulatory Visit: Payer: Self-pay | Admitting: Neurology

## 2018-03-27 IMAGING — CR DG CHEST 2V
2 series · 2 of 2 positions shown · non-contrast
Comparison: Chest radiograph 05/22/2016.

CLINICAL DATA: Patient with abnormal vital signs. Recent
chemotherapy.

EXAM:
CHEST  2 VIEW

[w chest lat]
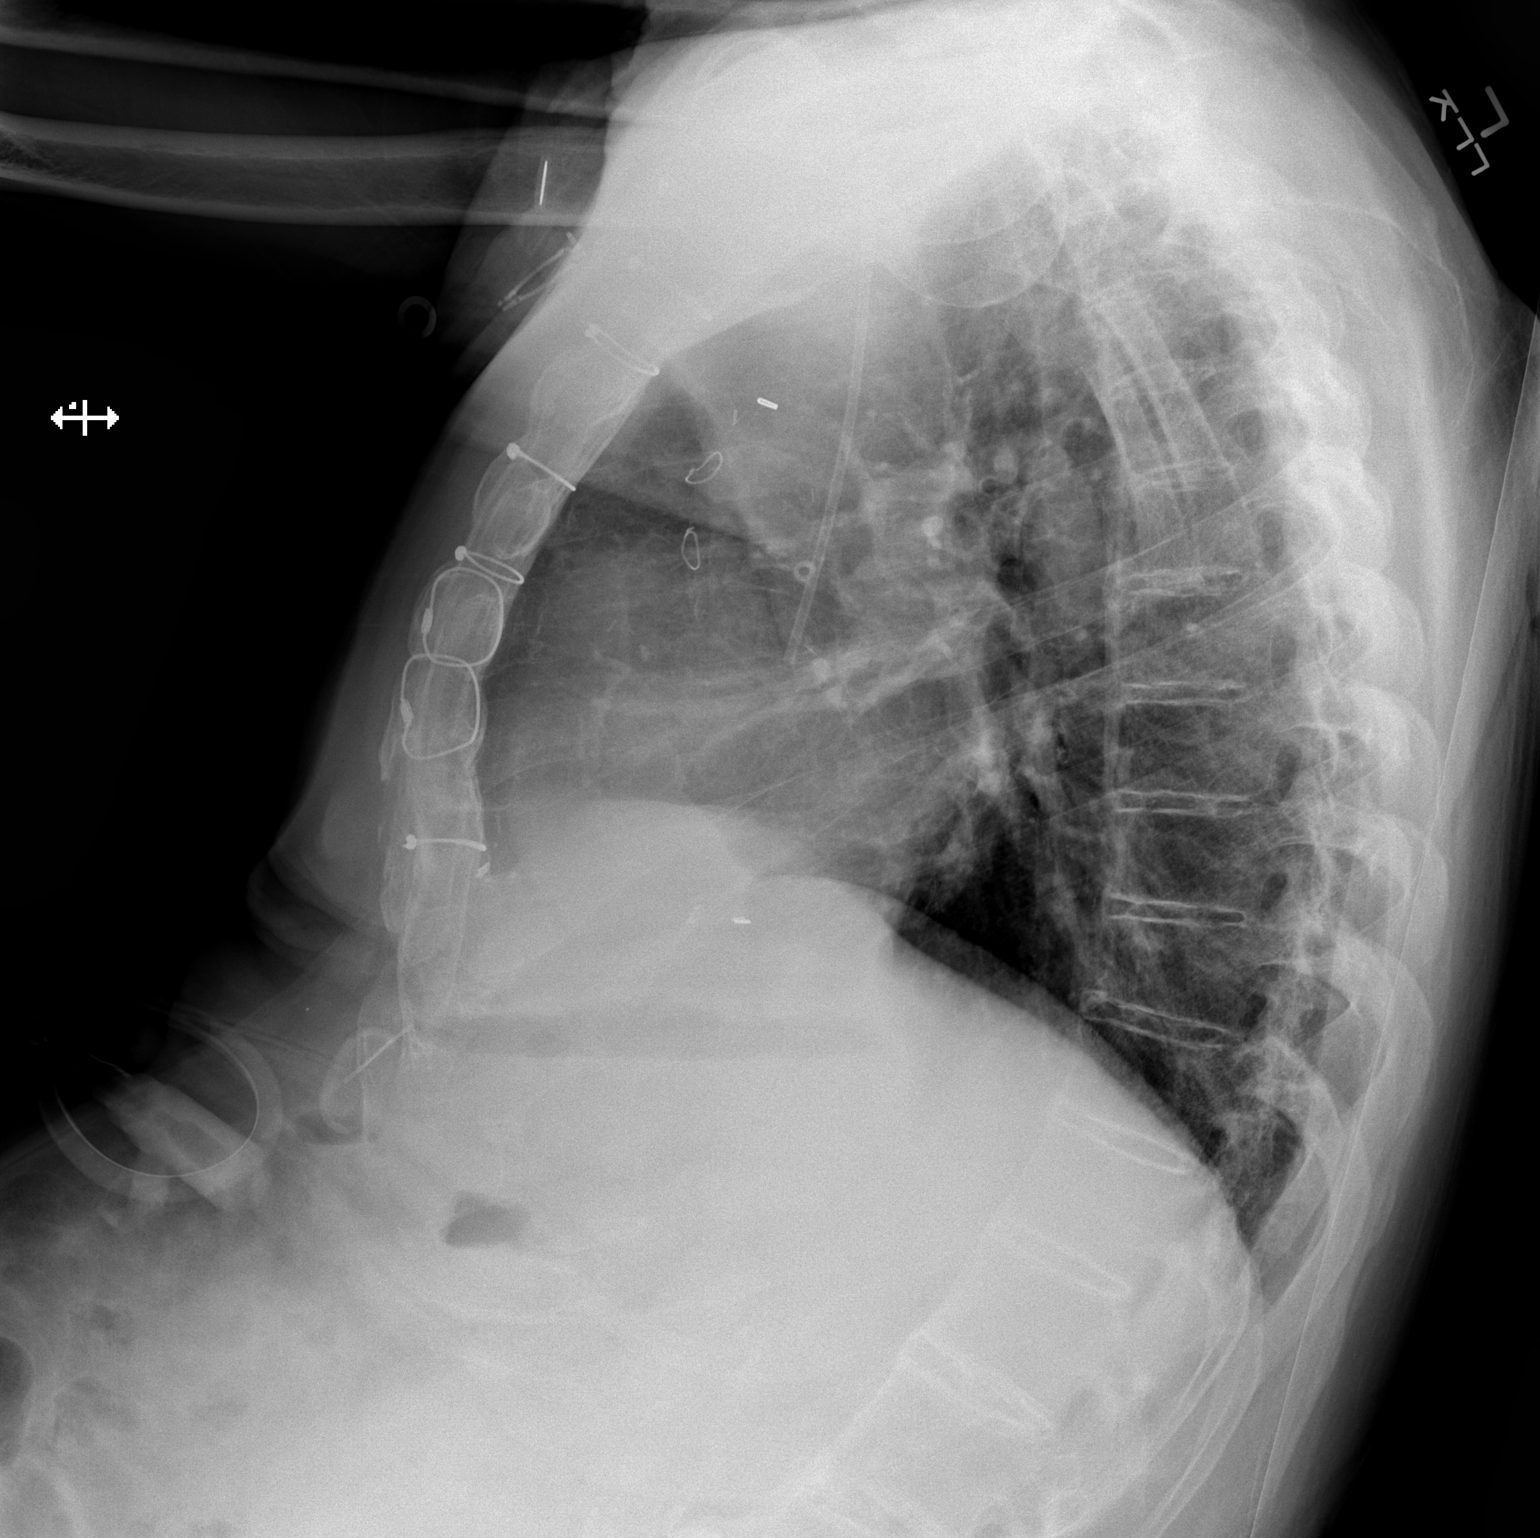

[x chest ap]
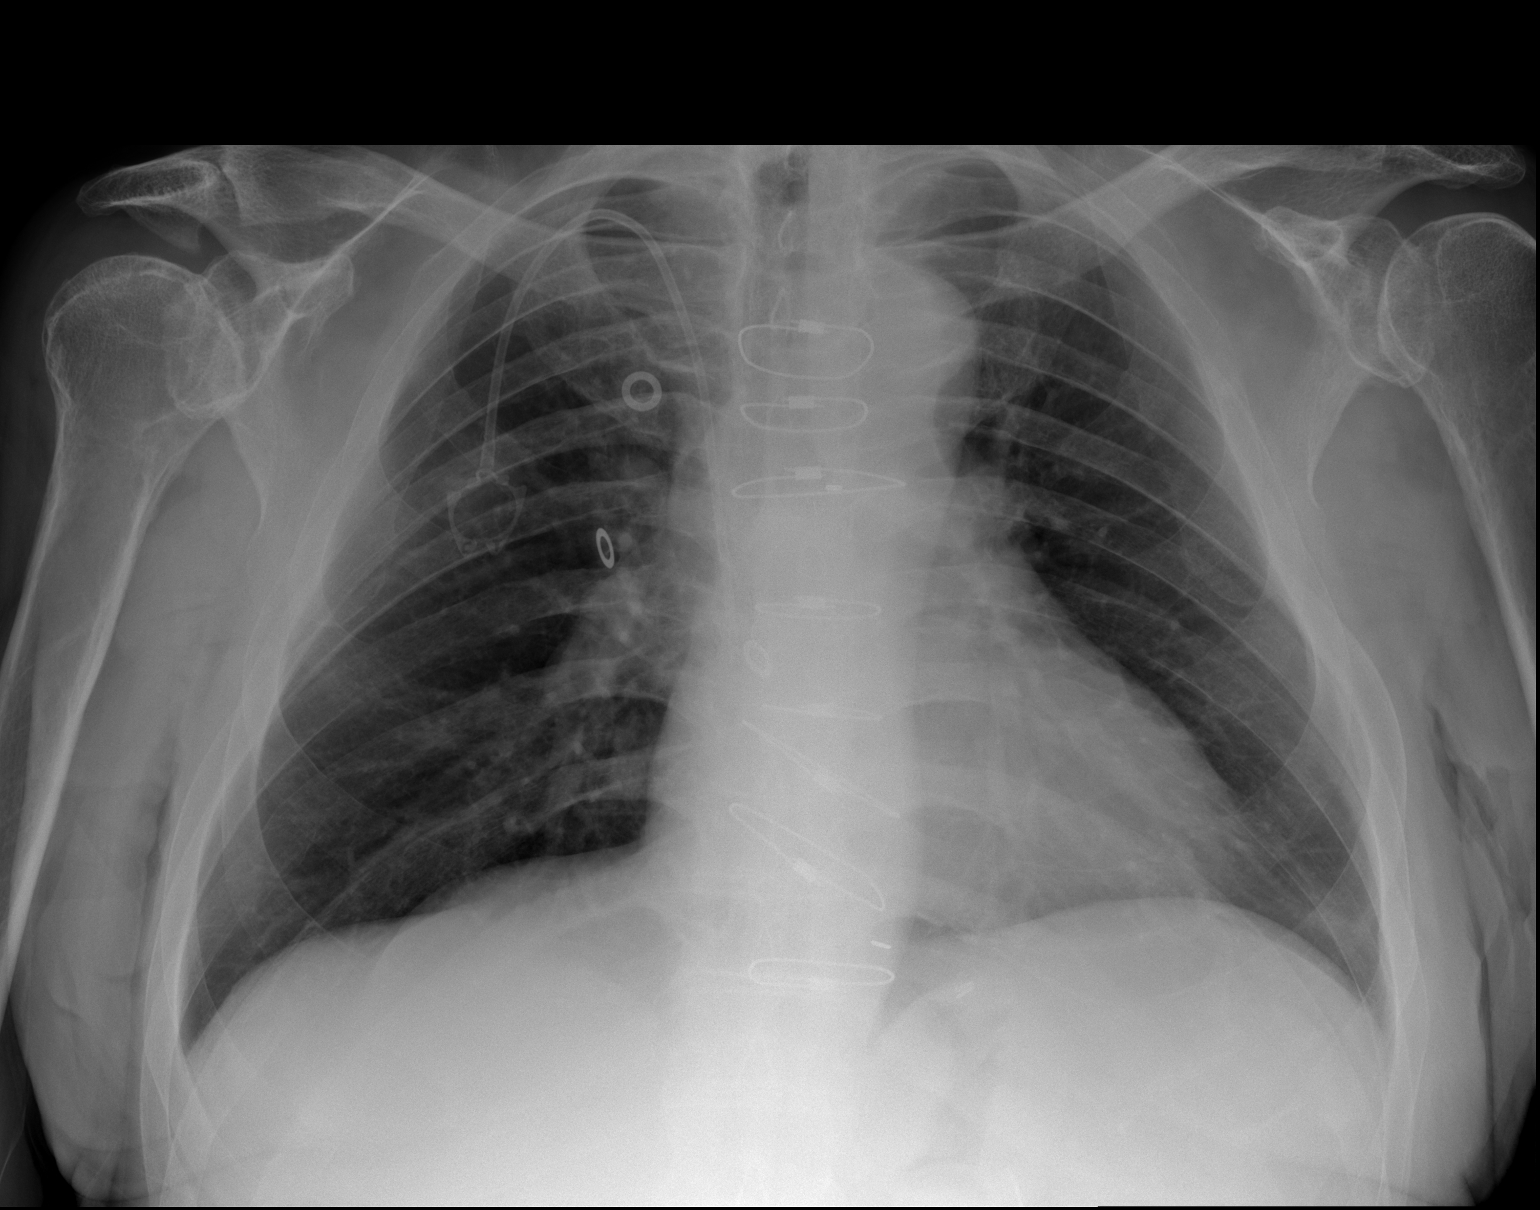

[2 of 2 positions shown; findings below may reference images not displayed]

FINDINGS: Right anterior chest wall Port-A-Cath is present with tip projecting
over the superior vena cava. Patient status post median sternotomy.
Stable cardiac and mediastinal contours. Low lung volumes. No large
area pulmonary consolidation. No pleural effusion or pneumothorax.
Thoracic spine degenerative changes.
IMPRESSION: Low lung volumes.  No acute cardiopulmonary process.

## 2018-06-05 IMAGING — DX DG RIBS W/ CHEST 3+V*L*
3 series · 3 of 3 positions shown · non-contrast
Comparison: 08/27/2016

CLINICAL DATA: Initial encounter for Patient states that he tripped
and fell this morning onto his left side, pain in ribs. Hx of
cardiac bypass, hernia repair. Former smoker- quit 25-30 years ago.

EXAM:
LEFT RIBS AND CHEST - 3+ VIEW

[chest pa]
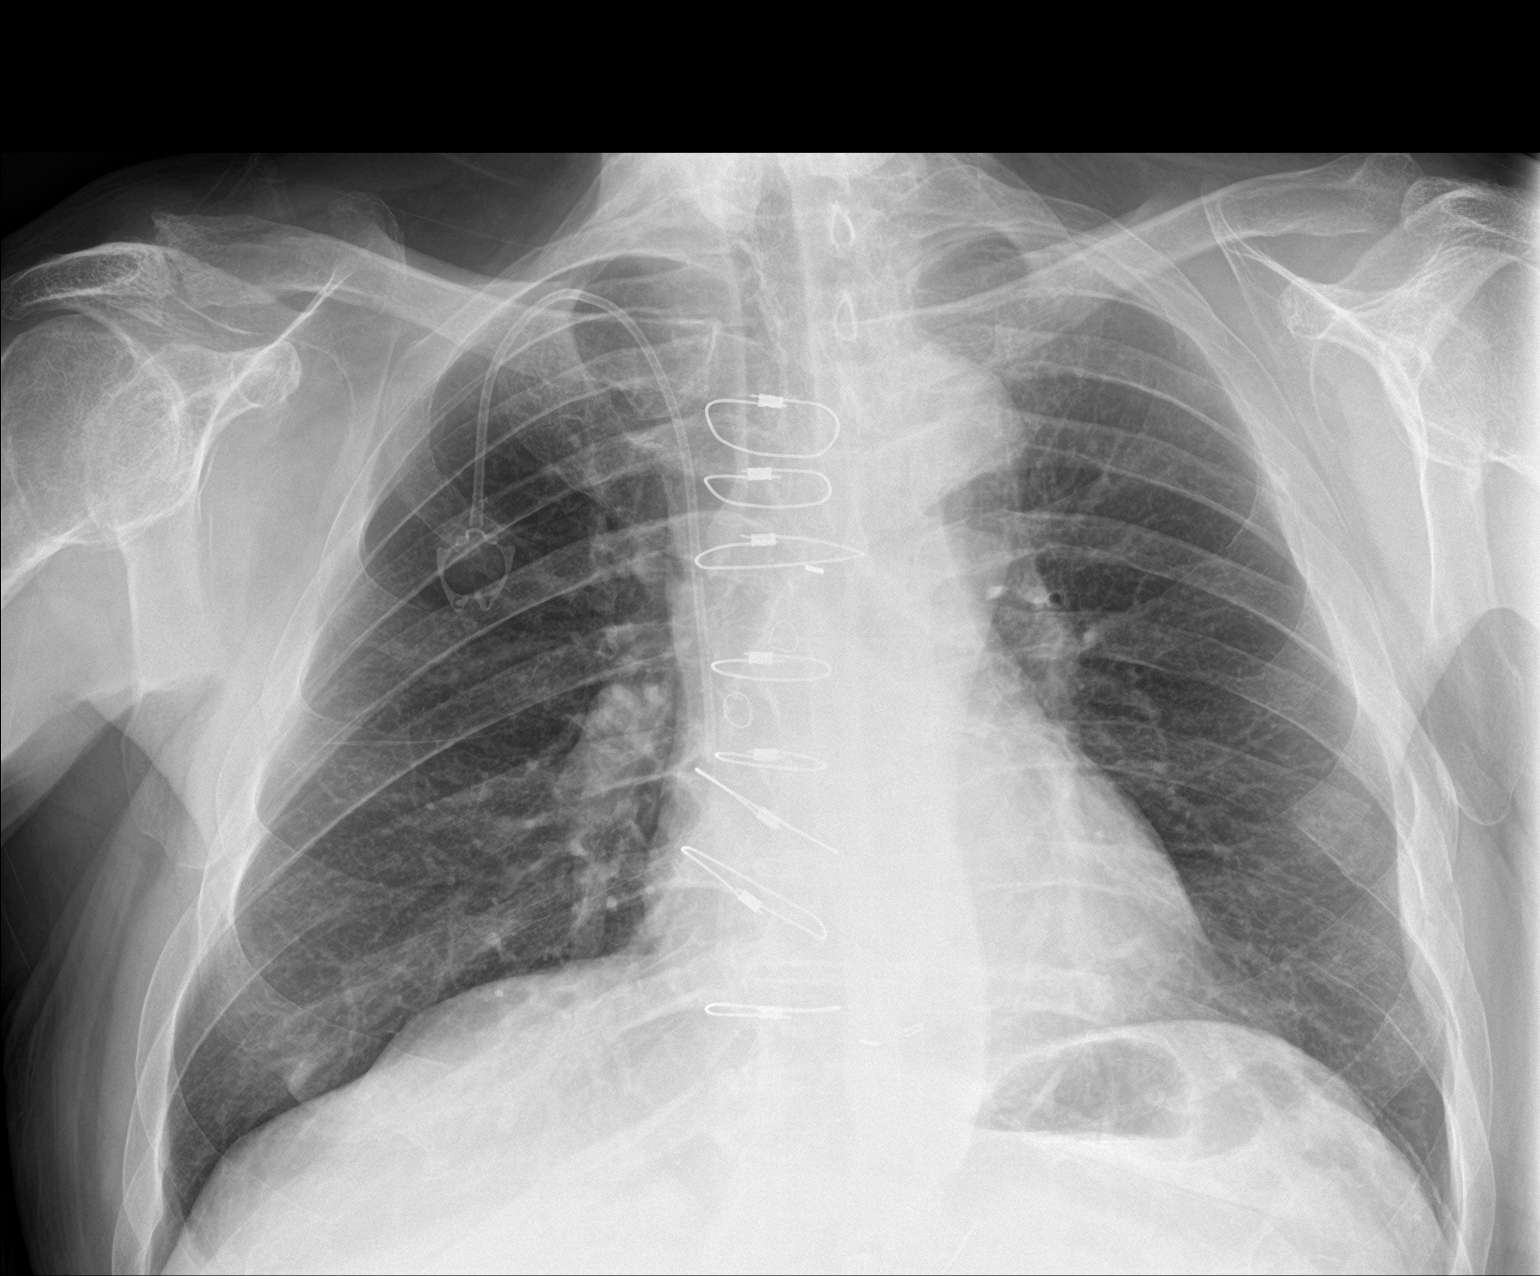

[rib obl]
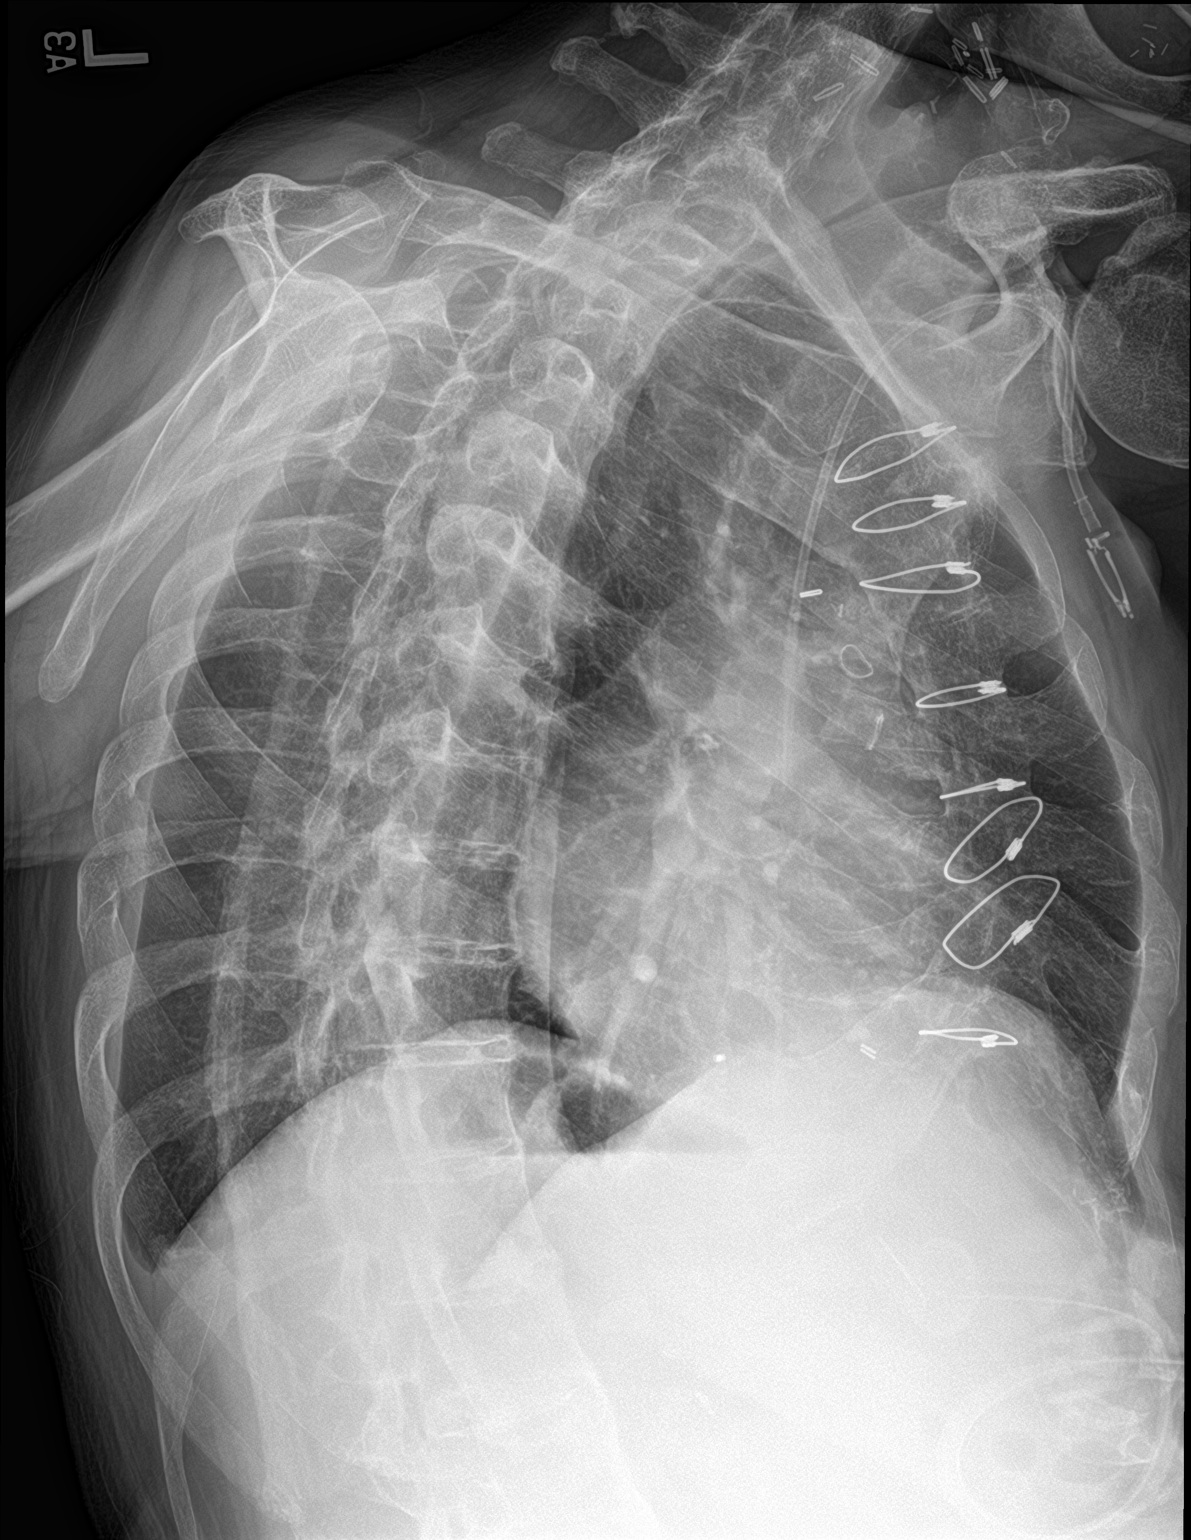

[rib pa]
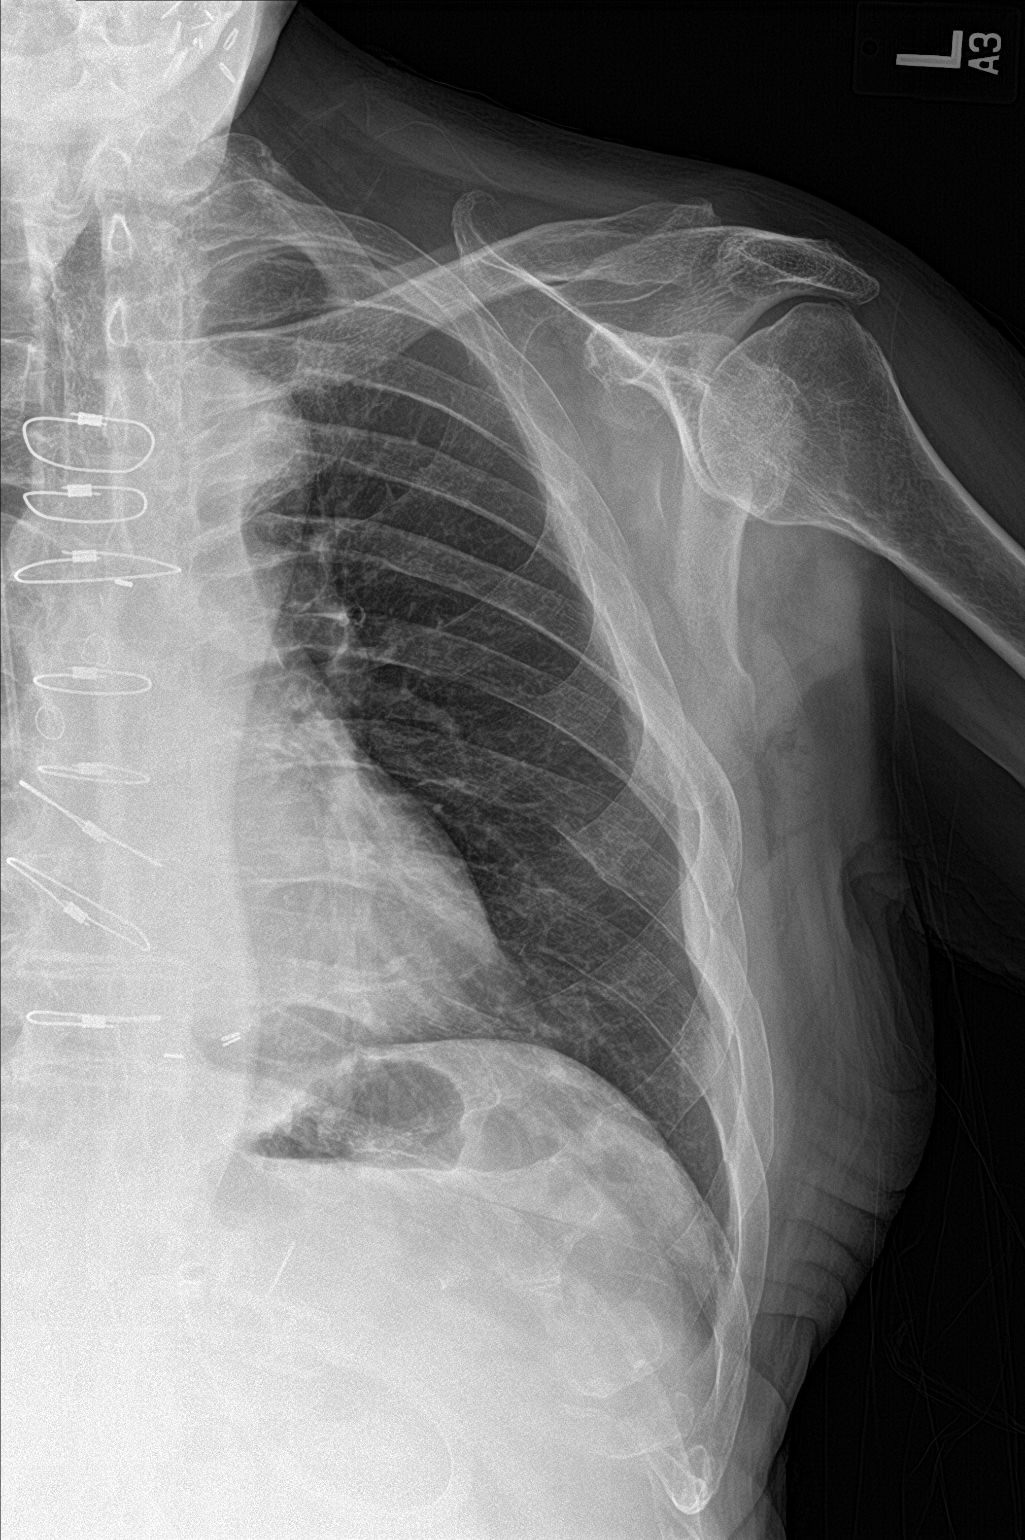

[3 of 3 positions shown; findings below may reference images not displayed]

FINDINGS: Frontal view chest and two views of left-sided ribs. Frontal view of
the chest demonstrates a right-sided Port-A-Cath which terminates at
the low SVC. Prior median sternotomy. No pleural effusion or
pneumothorax. Clear lungs.

Left-sided rib films demonstrate minimally displaced posterior
eighth left rib fracture. posterolateral left seventh nondisplaced
fracture. Ankylosing spondylitis.
IMPRESSION: Left-sided rib fractures, without pneumothorax or pleural fluid.
